# Patient Record
Sex: Female | Born: 1945 | Race: White | Hispanic: No | Marital: Married | Smoking: Never smoker
Health system: Southern US, Community
[De-identification: ages and names within clinical notes are randomized; demographics above are authoritative.]

## PROBLEM LIST (undated history)

## (undated) DIAGNOSIS — F419 Anxiety disorder, unspecified: Secondary | ICD-10-CM

## (undated) DIAGNOSIS — R0602 Shortness of breath: Secondary | ICD-10-CM

## (undated) DIAGNOSIS — D649 Anemia, unspecified: Secondary | ICD-10-CM

## (undated) DIAGNOSIS — K589 Irritable bowel syndrome without diarrhea: Secondary | ICD-10-CM

## (undated) DIAGNOSIS — R51 Headache: Secondary | ICD-10-CM

## (undated) DIAGNOSIS — F32A Depression, unspecified: Secondary | ICD-10-CM

## (undated) DIAGNOSIS — R413 Other amnesia: Secondary | ICD-10-CM

## (undated) DIAGNOSIS — F329 Major depressive disorder, single episode, unspecified: Secondary | ICD-10-CM

## (undated) DIAGNOSIS — K219 Gastro-esophageal reflux disease without esophagitis: Secondary | ICD-10-CM

## (undated) DIAGNOSIS — E039 Hypothyroidism, unspecified: Secondary | ICD-10-CM

## (undated) DIAGNOSIS — Z01818 Encounter for other preprocedural examination: Secondary | ICD-10-CM

## (undated) DIAGNOSIS — C801 Malignant (primary) neoplasm, unspecified: Secondary | ICD-10-CM

## (undated) DIAGNOSIS — F99 Mental disorder, not otherwise specified: Secondary | ICD-10-CM

## (undated) DIAGNOSIS — M1990 Unspecified osteoarthritis, unspecified site: Secondary | ICD-10-CM

## (undated) DIAGNOSIS — E871 Hypo-osmolality and hyponatremia: Secondary | ICD-10-CM

## (undated) DIAGNOSIS — G43909 Migraine, unspecified, not intractable, without status migrainosus: Secondary | ICD-10-CM

## (undated) DIAGNOSIS — S42309A Unspecified fracture of shaft of humerus, unspecified arm, initial encounter for closed fracture: Secondary | ICD-10-CM

## (undated) DIAGNOSIS — R569 Unspecified convulsions: Secondary | ICD-10-CM

## (undated) HISTORY — PX: STOMACH SURGERY: SHX791

## (undated) HISTORY — PX: APPENDECTOMY: SHX54

## (undated) HISTORY — PX: REPLACEMENT TOTAL KNEE: SUR1224

## (undated) HISTORY — DX: Migraine, unspecified, not intractable, without status migrainosus: G43.909

## (undated) HISTORY — DX: Hypo-osmolality and hyponatremia: E87.1

## (undated) HISTORY — PX: CHOLECYSTECTOMY: SHX55

## (undated) HISTORY — DX: Other amnesia: R41.3

## (undated) HISTORY — PX: TONSILLECTOMY: SUR1361

## (undated) HISTORY — PX: ABDOMINAL HYSTERECTOMY: SHX81

## (undated) HISTORY — PX: TUBAL LIGATION: SHX77

## (undated) HISTORY — PX: HERNIA REPAIR: SHX51

## (undated) HISTORY — DX: Encounter for other preprocedural examination: Z01.818

---

## 1970-09-04 HISTORY — PX: BREAST SURGERY: SHX581

## 1989-05-05 HISTORY — PX: BACK SURGERY: SHX140

## 1992-09-04 HISTORY — PX: GASTRECTOMY: SHX58

## 1998-02-02 ENCOUNTER — Emergency Department (HOSPITAL_COMMUNITY): Admission: EM | Admit: 1998-02-02 | Discharge: 1998-02-03 | Payer: Self-pay | Admitting: Emergency Medicine

## 1998-03-06 ENCOUNTER — Emergency Department (HOSPITAL_COMMUNITY): Admission: EM | Admit: 1998-03-06 | Discharge: 1998-03-06 | Payer: Self-pay | Admitting: Emergency Medicine

## 1998-04-05 ENCOUNTER — Emergency Department (HOSPITAL_COMMUNITY): Admission: EM | Admit: 1998-04-05 | Discharge: 1998-04-05 | Payer: Self-pay | Admitting: Internal Medicine

## 1998-04-06 ENCOUNTER — Emergency Department (HOSPITAL_COMMUNITY): Admission: EM | Admit: 1998-04-06 | Discharge: 1998-04-06 | Payer: Self-pay | Admitting: Emergency Medicine

## 1998-04-15 ENCOUNTER — Emergency Department (HOSPITAL_COMMUNITY): Admission: EM | Admit: 1998-04-15 | Discharge: 1998-04-15 | Payer: Self-pay

## 1998-04-16 ENCOUNTER — Emergency Department (HOSPITAL_COMMUNITY): Admission: EM | Admit: 1998-04-16 | Discharge: 1998-04-16 | Payer: Self-pay | Admitting: Emergency Medicine

## 1998-04-28 ENCOUNTER — Emergency Department (HOSPITAL_COMMUNITY): Admission: EM | Admit: 1998-04-28 | Discharge: 1998-04-28 | Payer: Self-pay | Admitting: Emergency Medicine

## 1998-05-08 ENCOUNTER — Emergency Department (HOSPITAL_COMMUNITY): Admission: EM | Admit: 1998-05-08 | Discharge: 1998-05-08 | Payer: Self-pay | Admitting: Emergency Medicine

## 1998-05-17 ENCOUNTER — Emergency Department (HOSPITAL_COMMUNITY): Admission: EM | Admit: 1998-05-17 | Discharge: 1998-05-17 | Payer: Self-pay | Admitting: Internal Medicine

## 1998-06-18 ENCOUNTER — Emergency Department (HOSPITAL_COMMUNITY): Admission: EM | Admit: 1998-06-18 | Discharge: 1998-06-18 | Payer: Self-pay | Admitting: Emergency Medicine

## 1998-07-07 ENCOUNTER — Emergency Department (HOSPITAL_COMMUNITY): Admission: EM | Admit: 1998-07-07 | Discharge: 1998-07-07 | Payer: Self-pay | Admitting: Emergency Medicine

## 1998-07-07 ENCOUNTER — Encounter: Payer: Self-pay | Admitting: Emergency Medicine

## 1998-09-18 ENCOUNTER — Emergency Department (HOSPITAL_COMMUNITY): Admission: EM | Admit: 1998-09-18 | Discharge: 1998-09-18 | Payer: Self-pay | Admitting: Endocrinology

## 1998-10-27 ENCOUNTER — Emergency Department (HOSPITAL_COMMUNITY): Admission: EM | Admit: 1998-10-27 | Discharge: 1998-10-27 | Payer: Self-pay | Admitting: Emergency Medicine

## 1999-01-16 ENCOUNTER — Emergency Department (HOSPITAL_COMMUNITY): Admission: EM | Admit: 1999-01-16 | Discharge: 1999-01-17 | Payer: Self-pay | Admitting: Emergency Medicine

## 1999-01-17 ENCOUNTER — Emergency Department (HOSPITAL_COMMUNITY): Admission: EM | Admit: 1999-01-17 | Discharge: 1999-01-17 | Payer: Self-pay | Admitting: Emergency Medicine

## 1999-02-03 ENCOUNTER — Emergency Department (HOSPITAL_COMMUNITY): Admission: EM | Admit: 1999-02-03 | Discharge: 1999-02-03 | Payer: Self-pay | Admitting: Emergency Medicine

## 1999-02-08 ENCOUNTER — Other Ambulatory Visit: Admission: RE | Admit: 1999-02-08 | Discharge: 1999-02-08 | Payer: Self-pay | Admitting: *Deleted

## 1999-02-21 ENCOUNTER — Emergency Department (HOSPITAL_COMMUNITY): Admission: EM | Admit: 1999-02-21 | Discharge: 1999-02-22 | Payer: Self-pay | Admitting: Emergency Medicine

## 1999-02-23 ENCOUNTER — Emergency Department (HOSPITAL_COMMUNITY): Admission: EM | Admit: 1999-02-23 | Discharge: 1999-02-23 | Payer: Self-pay | Admitting: Emergency Medicine

## 1999-02-27 ENCOUNTER — Emergency Department (HOSPITAL_COMMUNITY): Admission: EM | Admit: 1999-02-27 | Discharge: 1999-02-27 | Payer: Self-pay

## 1999-03-09 ENCOUNTER — Emergency Department (HOSPITAL_COMMUNITY): Admission: EM | Admit: 1999-03-09 | Discharge: 1999-03-09 | Payer: Self-pay | Admitting: Emergency Medicine

## 1999-03-24 ENCOUNTER — Emergency Department (HOSPITAL_COMMUNITY): Admission: EM | Admit: 1999-03-24 | Discharge: 1999-03-24 | Payer: Self-pay | Admitting: Emergency Medicine

## 1999-03-29 ENCOUNTER — Ambulatory Visit (HOSPITAL_COMMUNITY): Admission: RE | Admit: 1999-03-29 | Discharge: 1999-03-29 | Payer: Self-pay | Admitting: Gastroenterology

## 1999-04-03 ENCOUNTER — Emergency Department (HOSPITAL_COMMUNITY): Admission: EM | Admit: 1999-04-03 | Discharge: 1999-04-03 | Payer: Self-pay | Admitting: Emergency Medicine

## 1999-04-04 ENCOUNTER — Emergency Department (HOSPITAL_COMMUNITY): Admission: EM | Admit: 1999-04-04 | Discharge: 1999-04-04 | Payer: Self-pay | Admitting: Emergency Medicine

## 1999-04-26 ENCOUNTER — Emergency Department (HOSPITAL_COMMUNITY): Admission: EM | Admit: 1999-04-26 | Discharge: 1999-04-26 | Payer: Self-pay | Admitting: Emergency Medicine

## 1999-04-28 ENCOUNTER — Emergency Department (HOSPITAL_COMMUNITY): Admission: EM | Admit: 1999-04-28 | Discharge: 1999-04-28 | Payer: Self-pay | Admitting: Emergency Medicine

## 1999-05-01 ENCOUNTER — Emergency Department (HOSPITAL_COMMUNITY): Admission: EM | Admit: 1999-05-01 | Discharge: 1999-05-01 | Payer: Self-pay | Admitting: Emergency Medicine

## 1999-05-20 ENCOUNTER — Emergency Department (HOSPITAL_COMMUNITY): Admission: EM | Admit: 1999-05-20 | Discharge: 1999-05-20 | Payer: Self-pay

## 1999-05-24 ENCOUNTER — Emergency Department (HOSPITAL_COMMUNITY): Admission: EM | Admit: 1999-05-24 | Discharge: 1999-05-24 | Payer: Self-pay | Admitting: *Deleted

## 1999-05-25 ENCOUNTER — Emergency Department (HOSPITAL_COMMUNITY): Admission: EM | Admit: 1999-05-25 | Discharge: 1999-05-25 | Payer: Self-pay

## 1999-06-06 ENCOUNTER — Emergency Department (HOSPITAL_COMMUNITY): Admission: EM | Admit: 1999-06-06 | Discharge: 1999-06-06 | Payer: Self-pay | Admitting: Emergency Medicine

## 1999-06-08 ENCOUNTER — Emergency Department (HOSPITAL_COMMUNITY): Admission: EM | Admit: 1999-06-08 | Discharge: 1999-06-08 | Payer: Self-pay | Admitting: Emergency Medicine

## 1999-06-08 ENCOUNTER — Encounter: Payer: Self-pay | Admitting: Emergency Medicine

## 1999-06-18 ENCOUNTER — Emergency Department (HOSPITAL_COMMUNITY): Admission: EM | Admit: 1999-06-18 | Discharge: 1999-06-18 | Payer: Self-pay | Admitting: Emergency Medicine

## 1999-06-26 ENCOUNTER — Emergency Department (HOSPITAL_COMMUNITY): Admission: EM | Admit: 1999-06-26 | Discharge: 1999-06-26 | Payer: Self-pay | Admitting: Emergency Medicine

## 1999-07-07 ENCOUNTER — Emergency Department (HOSPITAL_COMMUNITY): Admission: EM | Admit: 1999-07-07 | Discharge: 1999-07-07 | Payer: Self-pay | Admitting: Emergency Medicine

## 1999-08-05 ENCOUNTER — Emergency Department (HOSPITAL_COMMUNITY): Admission: EM | Admit: 1999-08-05 | Discharge: 1999-08-05 | Payer: Self-pay | Admitting: *Deleted

## 1999-09-09 ENCOUNTER — Emergency Department (HOSPITAL_COMMUNITY): Admission: EM | Admit: 1999-09-09 | Discharge: 1999-09-09 | Payer: Self-pay | Admitting: Emergency Medicine

## 1999-09-25 ENCOUNTER — Emergency Department (HOSPITAL_COMMUNITY): Admission: EM | Admit: 1999-09-25 | Discharge: 1999-09-25 | Payer: Self-pay | Admitting: Emergency Medicine

## 1999-11-17 ENCOUNTER — Encounter: Admission: RE | Admit: 1999-11-17 | Discharge: 1999-11-17 | Payer: Self-pay

## 1999-11-29 ENCOUNTER — Emergency Department (HOSPITAL_COMMUNITY): Admission: EM | Admit: 1999-11-29 | Discharge: 1999-11-29 | Payer: Self-pay | Admitting: *Deleted

## 2000-01-08 ENCOUNTER — Emergency Department (HOSPITAL_COMMUNITY): Admission: EM | Admit: 2000-01-08 | Discharge: 2000-01-08 | Payer: Self-pay | Admitting: Emergency Medicine

## 2000-02-05 ENCOUNTER — Emergency Department (HOSPITAL_COMMUNITY): Admission: EM | Admit: 2000-02-05 | Discharge: 2000-02-05 | Payer: Self-pay | Admitting: Emergency Medicine

## 2000-03-25 ENCOUNTER — Emergency Department (HOSPITAL_COMMUNITY): Admission: EM | Admit: 2000-03-25 | Discharge: 2000-03-25 | Payer: Self-pay | Admitting: *Deleted

## 2000-04-17 ENCOUNTER — Encounter: Admission: RE | Admit: 2000-04-17 | Discharge: 2000-04-17 | Payer: Self-pay | Admitting: Gastroenterology

## 2000-04-17 ENCOUNTER — Encounter: Payer: Self-pay | Admitting: Gastroenterology

## 2000-04-18 ENCOUNTER — Encounter (INDEPENDENT_AMBULATORY_CARE_PROVIDER_SITE_OTHER): Payer: Self-pay | Admitting: Specialist

## 2000-04-18 ENCOUNTER — Ambulatory Visit (HOSPITAL_COMMUNITY): Admission: RE | Admit: 2000-04-18 | Discharge: 2000-04-18 | Payer: Self-pay | Admitting: Gastroenterology

## 2000-05-03 ENCOUNTER — Ambulatory Visit (HOSPITAL_COMMUNITY): Admission: RE | Admit: 2000-05-03 | Discharge: 2000-05-03 | Payer: Self-pay | Admitting: Gastroenterology

## 2000-05-03 ENCOUNTER — Encounter: Payer: Self-pay | Admitting: Gastroenterology

## 2000-05-07 ENCOUNTER — Emergency Department (HOSPITAL_COMMUNITY): Admission: EM | Admit: 2000-05-07 | Discharge: 2000-05-07 | Payer: Self-pay | Admitting: *Deleted

## 2000-06-12 ENCOUNTER — Inpatient Hospital Stay (HOSPITAL_COMMUNITY): Admission: RE | Admit: 2000-06-12 | Discharge: 2000-06-19 | Payer: Self-pay | Admitting: General Surgery

## 2000-06-12 ENCOUNTER — Encounter (INDEPENDENT_AMBULATORY_CARE_PROVIDER_SITE_OTHER): Payer: Self-pay | Admitting: Specialist

## 2000-07-18 ENCOUNTER — Emergency Department (HOSPITAL_COMMUNITY): Admission: EM | Admit: 2000-07-18 | Discharge: 2000-07-19 | Payer: Self-pay | Admitting: Emergency Medicine

## 2000-07-19 ENCOUNTER — Encounter: Payer: Self-pay | Admitting: Emergency Medicine

## 2000-08-15 ENCOUNTER — Emergency Department (HOSPITAL_COMMUNITY): Admission: EM | Admit: 2000-08-15 | Discharge: 2000-08-15 | Payer: Self-pay | Admitting: Emergency Medicine

## 2000-08-26 ENCOUNTER — Emergency Department (HOSPITAL_COMMUNITY): Admission: EM | Admit: 2000-08-26 | Discharge: 2000-08-26 | Payer: Self-pay | Admitting: Emergency Medicine

## 2000-09-10 ENCOUNTER — Emergency Department (HOSPITAL_COMMUNITY): Admission: EM | Admit: 2000-09-10 | Discharge: 2000-09-10 | Payer: Self-pay | Admitting: Emergency Medicine

## 2000-10-21 ENCOUNTER — Emergency Department (HOSPITAL_COMMUNITY): Admission: EM | Admit: 2000-10-21 | Discharge: 2000-10-21 | Payer: Self-pay | Admitting: Emergency Medicine

## 2000-12-30 ENCOUNTER — Emergency Department (HOSPITAL_COMMUNITY): Admission: EM | Admit: 2000-12-30 | Discharge: 2000-12-30 | Payer: Self-pay | Admitting: Emergency Medicine

## 2001-01-19 ENCOUNTER — Emergency Department (HOSPITAL_COMMUNITY): Admission: EM | Admit: 2001-01-19 | Discharge: 2001-01-19 | Payer: Self-pay | Admitting: Emergency Medicine

## 2001-02-15 ENCOUNTER — Emergency Department (HOSPITAL_COMMUNITY): Admission: EM | Admit: 2001-02-15 | Discharge: 2001-02-15 | Payer: Self-pay | Admitting: Emergency Medicine

## 2001-02-26 ENCOUNTER — Emergency Department (HOSPITAL_COMMUNITY): Admission: EM | Admit: 2001-02-26 | Discharge: 2001-02-26 | Payer: Self-pay | Admitting: Emergency Medicine

## 2001-03-01 ENCOUNTER — Emergency Department (HOSPITAL_COMMUNITY): Admission: EM | Admit: 2001-03-01 | Discharge: 2001-03-01 | Payer: Self-pay | Admitting: Emergency Medicine

## 2001-03-14 ENCOUNTER — Emergency Department (HOSPITAL_COMMUNITY): Admission: EM | Admit: 2001-03-14 | Discharge: 2001-03-14 | Payer: Self-pay | Admitting: Emergency Medicine

## 2001-03-19 ENCOUNTER — Encounter: Payer: Self-pay | Admitting: Emergency Medicine

## 2001-03-19 ENCOUNTER — Emergency Department (HOSPITAL_COMMUNITY): Admission: EM | Admit: 2001-03-19 | Discharge: 2001-03-19 | Payer: Self-pay | Admitting: Emergency Medicine

## 2001-05-25 ENCOUNTER — Emergency Department (HOSPITAL_COMMUNITY): Admission: EM | Admit: 2001-05-25 | Discharge: 2001-05-25 | Payer: Self-pay | Admitting: Emergency Medicine

## 2001-06-24 ENCOUNTER — Emergency Department (HOSPITAL_COMMUNITY): Admission: EM | Admit: 2001-06-24 | Discharge: 2001-06-25 | Payer: Self-pay | Admitting: Emergency Medicine

## 2001-07-10 ENCOUNTER — Encounter: Payer: Self-pay | Admitting: *Deleted

## 2001-07-10 ENCOUNTER — Inpatient Hospital Stay (HOSPITAL_COMMUNITY): Admission: EM | Admit: 2001-07-10 | Discharge: 2001-07-20 | Payer: Self-pay | Admitting: *Deleted

## 2001-07-11 ENCOUNTER — Encounter: Payer: Self-pay | Admitting: Family Medicine

## 2001-07-16 ENCOUNTER — Encounter: Payer: Self-pay | Admitting: Family Medicine

## 2001-07-17 ENCOUNTER — Encounter: Payer: Self-pay | Admitting: Family Medicine

## 2001-07-25 ENCOUNTER — Encounter: Admission: RE | Admit: 2001-07-25 | Discharge: 2001-07-25 | Payer: Self-pay | Admitting: Family Medicine

## 2001-12-18 ENCOUNTER — Emergency Department (HOSPITAL_COMMUNITY): Admission: EM | Admit: 2001-12-18 | Discharge: 2001-12-18 | Payer: Self-pay | Admitting: Emergency Medicine

## 2002-02-17 ENCOUNTER — Emergency Department (HOSPITAL_COMMUNITY): Admission: EM | Admit: 2002-02-17 | Discharge: 2002-02-17 | Payer: Self-pay | Admitting: Emergency Medicine

## 2002-03-05 ENCOUNTER — Emergency Department (HOSPITAL_COMMUNITY): Admission: EM | Admit: 2002-03-05 | Discharge: 2002-03-05 | Payer: Self-pay

## 2002-03-08 ENCOUNTER — Emergency Department (HOSPITAL_COMMUNITY): Admission: EM | Admit: 2002-03-08 | Discharge: 2002-03-08 | Payer: Self-pay | Admitting: Emergency Medicine

## 2002-03-18 ENCOUNTER — Emergency Department (HOSPITAL_COMMUNITY): Admission: EM | Admit: 2002-03-18 | Discharge: 2002-03-18 | Payer: Self-pay | Admitting: Emergency Medicine

## 2002-04-21 ENCOUNTER — Emergency Department (HOSPITAL_COMMUNITY): Admission: EM | Admit: 2002-04-21 | Discharge: 2002-04-21 | Payer: Self-pay | Admitting: Emergency Medicine

## 2002-05-09 ENCOUNTER — Emergency Department (HOSPITAL_COMMUNITY): Admission: EM | Admit: 2002-05-09 | Discharge: 2002-05-09 | Payer: Self-pay | Admitting: *Deleted

## 2002-05-10 ENCOUNTER — Emergency Department (HOSPITAL_COMMUNITY): Admission: EM | Admit: 2002-05-10 | Discharge: 2002-05-10 | Payer: Self-pay | Admitting: *Deleted

## 2002-06-02 ENCOUNTER — Emergency Department (HOSPITAL_COMMUNITY): Admission: EM | Admit: 2002-06-02 | Discharge: 2002-06-02 | Payer: Self-pay | Admitting: Emergency Medicine

## 2002-06-21 ENCOUNTER — Encounter: Payer: Self-pay | Admitting: *Deleted

## 2002-06-21 ENCOUNTER — Inpatient Hospital Stay (HOSPITAL_COMMUNITY): Admission: EM | Admit: 2002-06-21 | Discharge: 2002-06-23 | Payer: Self-pay | Admitting: Emergency Medicine

## 2002-06-23 ENCOUNTER — Encounter: Payer: Self-pay | Admitting: Cardiology

## 2002-08-06 ENCOUNTER — Emergency Department (HOSPITAL_COMMUNITY): Admission: EM | Admit: 2002-08-06 | Discharge: 2002-08-06 | Payer: Self-pay

## 2002-09-04 HISTORY — PX: ORIF ANKLE FRACTURE: SUR919

## 2002-09-12 ENCOUNTER — Emergency Department (HOSPITAL_COMMUNITY): Admission: EM | Admit: 2002-09-12 | Discharge: 2002-09-12 | Payer: Self-pay | Admitting: Emergency Medicine

## 2002-10-01 ENCOUNTER — Emergency Department (HOSPITAL_COMMUNITY): Admission: EM | Admit: 2002-10-01 | Discharge: 2002-10-01 | Payer: Self-pay | Admitting: Emergency Medicine

## 2002-10-11 ENCOUNTER — Emergency Department (HOSPITAL_COMMUNITY): Admission: EM | Admit: 2002-10-11 | Discharge: 2002-10-11 | Payer: Self-pay | Admitting: Emergency Medicine

## 2002-10-18 ENCOUNTER — Emergency Department (HOSPITAL_COMMUNITY): Admission: EM | Admit: 2002-10-18 | Discharge: 2002-10-18 | Payer: Self-pay | Admitting: Emergency Medicine

## 2002-11-18 ENCOUNTER — Emergency Department (HOSPITAL_COMMUNITY): Admission: EM | Admit: 2002-11-18 | Discharge: 2002-11-18 | Payer: Self-pay | Admitting: Emergency Medicine

## 2002-12-17 ENCOUNTER — Emergency Department (HOSPITAL_COMMUNITY): Admission: EM | Admit: 2002-12-17 | Discharge: 2002-12-17 | Payer: Self-pay | Admitting: Emergency Medicine

## 2002-12-24 ENCOUNTER — Emergency Department (HOSPITAL_COMMUNITY): Admission: EM | Admit: 2002-12-24 | Discharge: 2002-12-24 | Payer: Self-pay | Admitting: Emergency Medicine

## 2003-01-01 ENCOUNTER — Emergency Department (HOSPITAL_COMMUNITY): Admission: EM | Admit: 2003-01-01 | Discharge: 2003-01-01 | Payer: Self-pay | Admitting: Emergency Medicine

## 2003-01-14 ENCOUNTER — Encounter: Payer: Self-pay | Admitting: Emergency Medicine

## 2003-01-14 ENCOUNTER — Inpatient Hospital Stay (HOSPITAL_COMMUNITY): Admission: EM | Admit: 2003-01-14 | Discharge: 2003-01-28 | Payer: Self-pay | Admitting: Emergency Medicine

## 2003-01-16 ENCOUNTER — Encounter: Payer: Self-pay | Admitting: General Surgery

## 2003-01-19 ENCOUNTER — Encounter (HOSPITAL_BASED_OUTPATIENT_CLINIC_OR_DEPARTMENT_OTHER): Payer: Self-pay | Admitting: General Surgery

## 2003-01-19 ENCOUNTER — Encounter: Payer: Self-pay | Admitting: General Surgery

## 2003-01-20 ENCOUNTER — Encounter: Payer: Self-pay | Admitting: General Surgery

## 2003-02-15 ENCOUNTER — Emergency Department (HOSPITAL_COMMUNITY): Admission: EM | Admit: 2003-02-15 | Discharge: 2003-02-15 | Payer: Self-pay | Admitting: Emergency Medicine

## 2003-03-02 ENCOUNTER — Emergency Department (HOSPITAL_COMMUNITY): Admission: EM | Admit: 2003-03-02 | Discharge: 2003-03-02 | Payer: Self-pay | Admitting: Emergency Medicine

## 2003-03-03 ENCOUNTER — Emergency Department (HOSPITAL_COMMUNITY): Admission: EM | Admit: 2003-03-03 | Discharge: 2003-03-03 | Payer: Self-pay | Admitting: Emergency Medicine

## 2003-03-16 ENCOUNTER — Emergency Department (HOSPITAL_COMMUNITY): Admission: EM | Admit: 2003-03-16 | Discharge: 2003-03-16 | Payer: Self-pay | Admitting: Emergency Medicine

## 2003-04-03 ENCOUNTER — Encounter: Admission: RE | Admit: 2003-04-03 | Discharge: 2003-04-03 | Payer: Self-pay | Admitting: Gastroenterology

## 2003-04-03 ENCOUNTER — Encounter: Payer: Self-pay | Admitting: Gastroenterology

## 2003-04-13 ENCOUNTER — Encounter: Payer: Self-pay | Admitting: Family Medicine

## 2003-04-13 ENCOUNTER — Encounter: Admission: RE | Admit: 2003-04-13 | Discharge: 2003-04-13 | Payer: Self-pay | Admitting: Family Medicine

## 2003-04-27 ENCOUNTER — Encounter: Admission: RE | Admit: 2003-04-27 | Discharge: 2003-04-27 | Payer: Self-pay | Admitting: Family Medicine

## 2003-05-02 ENCOUNTER — Emergency Department (HOSPITAL_COMMUNITY): Admission: EM | Admit: 2003-05-02 | Discharge: 2003-05-02 | Payer: Self-pay | Admitting: Emergency Medicine

## 2003-05-22 ENCOUNTER — Emergency Department (HOSPITAL_COMMUNITY): Admission: EM | Admit: 2003-05-22 | Discharge: 2003-05-22 | Payer: Self-pay | Admitting: Emergency Medicine

## 2003-05-24 ENCOUNTER — Emergency Department (HOSPITAL_COMMUNITY): Admission: EM | Admit: 2003-05-24 | Discharge: 2003-05-24 | Payer: Self-pay | Admitting: Emergency Medicine

## 2003-05-26 ENCOUNTER — Emergency Department (HOSPITAL_COMMUNITY): Admission: EM | Admit: 2003-05-26 | Discharge: 2003-05-26 | Payer: Self-pay | Admitting: Emergency Medicine

## 2003-06-03 ENCOUNTER — Emergency Department (HOSPITAL_COMMUNITY): Admission: EM | Admit: 2003-06-03 | Discharge: 2003-06-03 | Payer: Self-pay | Admitting: Emergency Medicine

## 2003-06-22 ENCOUNTER — Emergency Department (HOSPITAL_COMMUNITY): Admission: EM | Admit: 2003-06-22 | Discharge: 2003-06-23 | Payer: Self-pay | Admitting: Emergency Medicine

## 2003-07-21 ENCOUNTER — Emergency Department (HOSPITAL_COMMUNITY): Admission: EM | Admit: 2003-07-21 | Discharge: 2003-07-21 | Payer: Self-pay | Admitting: Emergency Medicine

## 2003-09-05 HISTORY — PX: SPINE SURGERY: SHX786

## 2003-09-17 ENCOUNTER — Emergency Department (HOSPITAL_COMMUNITY): Admission: EM | Admit: 2003-09-17 | Discharge: 2003-09-17 | Payer: Self-pay | Admitting: Emergency Medicine

## 2003-09-18 ENCOUNTER — Emergency Department (HOSPITAL_COMMUNITY): Admission: EM | Admit: 2003-09-18 | Discharge: 2003-09-18 | Payer: Self-pay | Admitting: Emergency Medicine

## 2003-09-23 ENCOUNTER — Inpatient Hospital Stay (HOSPITAL_COMMUNITY): Admission: RE | Admit: 2003-09-23 | Discharge: 2003-09-26 | Payer: Self-pay | Admitting: General Surgery

## 2003-10-17 ENCOUNTER — Observation Stay (HOSPITAL_COMMUNITY): Admission: AD | Admit: 2003-10-17 | Discharge: 2003-10-17 | Payer: Self-pay | Admitting: *Deleted

## 2003-11-18 ENCOUNTER — Emergency Department (HOSPITAL_COMMUNITY): Admission: EM | Admit: 2003-11-18 | Discharge: 2003-11-18 | Payer: Self-pay | Admitting: Emergency Medicine

## 2003-12-02 ENCOUNTER — Emergency Department (HOSPITAL_COMMUNITY): Admission: EM | Admit: 2003-12-02 | Discharge: 2003-12-02 | Payer: Self-pay | Admitting: Emergency Medicine

## 2004-01-04 ENCOUNTER — Encounter: Admission: RE | Admit: 2004-01-04 | Discharge: 2004-01-04 | Payer: Self-pay | Admitting: Family Medicine

## 2004-05-18 ENCOUNTER — Ambulatory Visit: Payer: Self-pay | Admitting: Family Medicine

## 2004-05-18 ENCOUNTER — Inpatient Hospital Stay (HOSPITAL_COMMUNITY): Admission: AD | Admit: 2004-05-18 | Discharge: 2004-05-19 | Payer: Self-pay | Admitting: Family Medicine

## 2004-05-31 ENCOUNTER — Ambulatory Visit: Payer: Self-pay | Admitting: Family Medicine

## 2004-07-24 ENCOUNTER — Emergency Department (HOSPITAL_COMMUNITY): Admission: EM | Admit: 2004-07-24 | Discharge: 2004-07-24 | Payer: Self-pay | Admitting: *Deleted

## 2004-07-25 ENCOUNTER — Emergency Department (HOSPITAL_COMMUNITY): Admission: EM | Admit: 2004-07-25 | Discharge: 2004-07-25 | Payer: Self-pay | Admitting: Emergency Medicine

## 2004-08-08 ENCOUNTER — Emergency Department (HOSPITAL_COMMUNITY): Admission: EM | Admit: 2004-08-08 | Discharge: 2004-08-08 | Payer: Self-pay | Admitting: Emergency Medicine

## 2004-08-27 ENCOUNTER — Emergency Department (HOSPITAL_COMMUNITY): Admission: EM | Admit: 2004-08-27 | Discharge: 2004-08-27 | Payer: Self-pay | Admitting: Emergency Medicine

## 2004-09-02 ENCOUNTER — Emergency Department (HOSPITAL_COMMUNITY): Admission: EM | Admit: 2004-09-02 | Discharge: 2004-09-02 | Payer: Self-pay | Admitting: *Deleted

## 2004-09-11 ENCOUNTER — Inpatient Hospital Stay (HOSPITAL_COMMUNITY): Admission: EM | Admit: 2004-09-11 | Discharge: 2004-09-28 | Payer: Self-pay | Admitting: Emergency Medicine

## 2004-09-11 ENCOUNTER — Ambulatory Visit: Payer: Self-pay | Admitting: Pulmonary Disease

## 2004-09-11 ENCOUNTER — Ambulatory Visit: Payer: Self-pay | Admitting: Sports Medicine

## 2004-11-02 ENCOUNTER — Ambulatory Visit: Payer: Self-pay | Admitting: Family Medicine

## 2005-01-05 ENCOUNTER — Encounter
Admission: RE | Admit: 2005-01-05 | Discharge: 2005-01-05 | Payer: Self-pay | Admitting: Physical Medicine and Rehabilitation

## 2005-01-11 ENCOUNTER — Encounter (INDEPENDENT_AMBULATORY_CARE_PROVIDER_SITE_OTHER): Payer: Self-pay | Admitting: *Deleted

## 2005-01-11 ENCOUNTER — Encounter
Admission: RE | Admit: 2005-01-11 | Discharge: 2005-01-11 | Payer: Self-pay | Admitting: Physical Medicine and Rehabilitation

## 2005-01-17 ENCOUNTER — Encounter: Admission: RE | Admit: 2005-01-17 | Discharge: 2005-01-17 | Payer: Self-pay | Admitting: Family Medicine

## 2005-04-18 ENCOUNTER — Encounter: Admission: RE | Admit: 2005-04-18 | Discharge: 2005-04-18 | Payer: Self-pay | Admitting: Specialist

## 2005-05-01 ENCOUNTER — Inpatient Hospital Stay (HOSPITAL_COMMUNITY): Admission: RE | Admit: 2005-05-01 | Discharge: 2005-05-03 | Payer: Self-pay | Admitting: Specialist

## 2005-07-18 ENCOUNTER — Ambulatory Visit: Payer: Self-pay | Admitting: Family Medicine

## 2005-08-01 ENCOUNTER — Ambulatory Visit: Payer: Self-pay | Admitting: Family Medicine

## 2005-10-18 ENCOUNTER — Encounter: Admission: RE | Admit: 2005-10-18 | Discharge: 2005-10-18 | Payer: Self-pay | Admitting: Sports Medicine

## 2006-01-26 ENCOUNTER — Emergency Department (HOSPITAL_COMMUNITY): Admission: EM | Admit: 2006-01-26 | Discharge: 2006-01-26 | Payer: Self-pay | Admitting: Emergency Medicine

## 2006-01-30 ENCOUNTER — Ambulatory Visit: Payer: Self-pay | Admitting: Sports Medicine

## 2006-02-14 ENCOUNTER — Ambulatory Visit: Payer: Self-pay | Admitting: Family Medicine

## 2006-02-26 ENCOUNTER — Ambulatory Visit: Payer: Self-pay | Admitting: Sports Medicine

## 2006-05-30 ENCOUNTER — Ambulatory Visit: Payer: Self-pay | Admitting: Family Medicine

## 2006-05-30 ENCOUNTER — Ambulatory Visit (HOSPITAL_COMMUNITY): Admission: RE | Admit: 2006-05-30 | Discharge: 2006-05-30 | Payer: Self-pay | Admitting: Family Medicine

## 2006-06-06 ENCOUNTER — Ambulatory Visit: Payer: Self-pay | Admitting: Family Medicine

## 2006-06-11 ENCOUNTER — Ambulatory Visit (HOSPITAL_COMMUNITY): Admission: RE | Admit: 2006-06-11 | Discharge: 2006-06-12 | Payer: Self-pay | Admitting: Obstetrics and Gynecology

## 2006-06-30 ENCOUNTER — Inpatient Hospital Stay (HOSPITAL_COMMUNITY): Admission: EM | Admit: 2006-06-30 | Discharge: 2006-07-05 | Payer: Self-pay | Admitting: Emergency Medicine

## 2006-07-04 ENCOUNTER — Ambulatory Visit: Payer: Self-pay | Admitting: Physical Medicine & Rehabilitation

## 2006-11-01 DIAGNOSIS — K589 Irritable bowel syndrome without diarrhea: Secondary | ICD-10-CM

## 2006-11-01 DIAGNOSIS — F410 Panic disorder [episodic paroxysmal anxiety] without agoraphobia: Secondary | ICD-10-CM | POA: Insufficient documentation

## 2006-11-01 DIAGNOSIS — G43909 Migraine, unspecified, not intractable, without status migrainosus: Secondary | ICD-10-CM

## 2006-11-01 DIAGNOSIS — M26609 Unspecified temporomandibular joint disorder, unspecified side: Secondary | ICD-10-CM | POA: Insufficient documentation

## 2006-11-01 DIAGNOSIS — M949 Disorder of cartilage, unspecified: Secondary | ICD-10-CM

## 2006-11-01 DIAGNOSIS — G5600 Carpal tunnel syndrome, unspecified upper limb: Secondary | ICD-10-CM

## 2006-11-01 DIAGNOSIS — E039 Hypothyroidism, unspecified: Secondary | ICD-10-CM

## 2006-11-01 DIAGNOSIS — M899 Disorder of bone, unspecified: Secondary | ICD-10-CM | POA: Insufficient documentation

## 2006-11-01 DIAGNOSIS — F339 Major depressive disorder, recurrent, unspecified: Secondary | ICD-10-CM | POA: Insufficient documentation

## 2006-11-06 ENCOUNTER — Telehealth: Payer: Self-pay | Admitting: *Deleted

## 2006-11-14 ENCOUNTER — Telehealth: Payer: Self-pay | Admitting: *Deleted

## 2006-11-15 ENCOUNTER — Encounter (INDEPENDENT_AMBULATORY_CARE_PROVIDER_SITE_OTHER): Payer: Self-pay | Admitting: Family Medicine

## 2006-11-15 ENCOUNTER — Ambulatory Visit: Payer: Self-pay | Admitting: Family Medicine

## 2006-11-15 LAB — CONVERTED CEMR LAB
ALT: 11 units/L (ref 0–35)
AST: 18 units/L (ref 0–37)
Alkaline Phosphatase: 119 units/L — ABNORMAL HIGH (ref 39–117)
CO2: 20 meq/L (ref 19–32)
Creatinine, Ser: 0.98 mg/dL (ref 0.40–1.20)
HCT: 37.8 % (ref 36.0–46.0)
MCV: 84.6 fL (ref 78.0–100.0)
Platelets: 389 10*3/uL (ref 150–400)
RDW: 15.4 % — ABNORMAL HIGH (ref 11.5–14.0)
Sodium: 132 meq/L — ABNORMAL LOW (ref 135–145)
Total Bilirubin: 0.4 mg/dL (ref 0.3–1.2)
Total Protein: 6.7 g/dL (ref 6.0–8.3)
WBC: 14.5 10*3/uL — ABNORMAL HIGH (ref 4.0–10.5)

## 2006-11-16 ENCOUNTER — Encounter (INDEPENDENT_AMBULATORY_CARE_PROVIDER_SITE_OTHER): Payer: Self-pay | Admitting: *Deleted

## 2006-11-16 LAB — CONVERTED CEMR LAB
TSH: 1.919 microintl units/mL
aPTT: 29 s

## 2006-11-19 ENCOUNTER — Ambulatory Visit (HOSPITAL_COMMUNITY): Admission: RE | Admit: 2006-11-19 | Discharge: 2006-11-19 | Payer: Self-pay | Admitting: Gastroenterology

## 2006-11-19 ENCOUNTER — Encounter (INDEPENDENT_AMBULATORY_CARE_PROVIDER_SITE_OTHER): Payer: Self-pay | Admitting: Specialist

## 2006-11-19 LAB — HM COLONOSCOPY

## 2006-11-22 ENCOUNTER — Encounter (INDEPENDENT_AMBULATORY_CARE_PROVIDER_SITE_OTHER): Payer: Self-pay | Admitting: *Deleted

## 2006-12-04 ENCOUNTER — Encounter (INDEPENDENT_AMBULATORY_CARE_PROVIDER_SITE_OTHER): Payer: Self-pay | Admitting: *Deleted

## 2006-12-26 ENCOUNTER — Ambulatory Visit: Payer: Self-pay | Admitting: Sports Medicine

## 2006-12-26 DIAGNOSIS — F1120 Opioid dependence, uncomplicated: Secondary | ICD-10-CM | POA: Insufficient documentation

## 2006-12-26 DIAGNOSIS — N3942 Incontinence without sensory awareness: Secondary | ICD-10-CM

## 2006-12-26 DIAGNOSIS — R413 Other amnesia: Secondary | ICD-10-CM

## 2007-01-31 ENCOUNTER — Ambulatory Visit (HOSPITAL_COMMUNITY): Admission: RE | Admit: 2007-01-31 | Discharge: 2007-02-01 | Payer: Self-pay | Admitting: Orthopedic Surgery

## 2007-02-17 ENCOUNTER — Emergency Department (HOSPITAL_COMMUNITY): Admission: EM | Admit: 2007-02-17 | Discharge: 2007-02-17 | Payer: Self-pay | Admitting: Emergency Medicine

## 2007-04-10 ENCOUNTER — Telehealth: Payer: Self-pay | Admitting: Family Medicine

## 2007-04-25 ENCOUNTER — Telehealth: Payer: Self-pay | Admitting: *Deleted

## 2007-06-10 ENCOUNTER — Encounter: Payer: Self-pay | Admitting: Family Medicine

## 2007-06-10 ENCOUNTER — Ambulatory Visit: Payer: Self-pay | Admitting: Sports Medicine

## 2007-06-13 LAB — CONVERTED CEMR LAB
ALT: 13 units/L (ref 0–35)
BUN: 14 mg/dL (ref 6–23)
CO2: 24 meq/L (ref 19–32)
Calcium: 9.2 mg/dL (ref 8.4–10.5)
Chloride: 98 meq/L (ref 96–112)
Creatinine, Ser: 1.11 mg/dL (ref 0.40–1.20)
Glucose, Bld: 92 mg/dL (ref 70–99)
HCT: 35.2 % — ABNORMAL LOW (ref 36.0–46.0)
Hemoglobin: 10.9 g/dL — ABNORMAL LOW (ref 12.0–15.0)
MCV: 76.5 fL — ABNORMAL LOW (ref 78.0–100.0)
Platelets: 246 10*3/uL (ref 150–400)
Pro B Natriuretic peptide (BNP): 46 pg/mL (ref 0.0–100.0)
RBC: 4.6 M/uL (ref 3.87–5.11)
TSH: 2.856 microintl units/mL (ref 0.350–5.50)
Total Bilirubin: 0.4 mg/dL (ref 0.3–1.2)
WBC: 10.3 10*3/uL (ref 4.0–10.5)

## 2007-07-11 ENCOUNTER — Encounter: Admission: RE | Admit: 2007-07-11 | Discharge: 2007-07-11 | Payer: Self-pay | Admitting: Sports Medicine

## 2007-07-12 ENCOUNTER — Ambulatory Visit: Payer: Self-pay | Admitting: Family Medicine

## 2007-07-17 ENCOUNTER — Encounter: Payer: Self-pay | Admitting: Family Medicine

## 2007-07-18 ENCOUNTER — Ambulatory Visit: Payer: Self-pay | Admitting: Cardiology

## 2007-07-22 LAB — CONVERTED CEMR LAB
Creatinine 24 HR UR: 523 mg/24hr — ABNORMAL LOW (ref 700–1800)
Creatinine, Urine: 74.8 mg/dL

## 2007-07-23 ENCOUNTER — Encounter (INDEPENDENT_AMBULATORY_CARE_PROVIDER_SITE_OTHER): Payer: Self-pay | Admitting: Emergency Medicine

## 2007-07-23 ENCOUNTER — Ambulatory Visit: Payer: Self-pay | Admitting: Cardiology

## 2007-07-23 ENCOUNTER — Ambulatory Visit (HOSPITAL_COMMUNITY): Admission: RE | Admit: 2007-07-23 | Discharge: 2007-07-23 | Payer: Self-pay | Admitting: Emergency Medicine

## 2007-08-12 ENCOUNTER — Ambulatory Visit: Payer: Self-pay | Admitting: Cardiology

## 2007-08-13 ENCOUNTER — Ambulatory Visit: Payer: Self-pay | Admitting: Family Medicine

## 2007-09-14 ENCOUNTER — Encounter: Payer: Self-pay | Admitting: Family Medicine

## 2007-11-11 ENCOUNTER — Telehealth: Payer: Self-pay | Admitting: Family Medicine

## 2007-12-27 ENCOUNTER — Telehealth (INDEPENDENT_AMBULATORY_CARE_PROVIDER_SITE_OTHER): Payer: Self-pay | Admitting: *Deleted

## 2008-01-28 ENCOUNTER — Encounter: Payer: Self-pay | Admitting: *Deleted

## 2008-01-31 ENCOUNTER — Encounter: Admission: RE | Admit: 2008-01-31 | Discharge: 2008-01-31 | Payer: Self-pay | Admitting: Orthopedic Surgery

## 2008-02-25 ENCOUNTER — Encounter (INDEPENDENT_AMBULATORY_CARE_PROVIDER_SITE_OTHER): Payer: Self-pay | Admitting: *Deleted

## 2008-03-05 ENCOUNTER — Encounter: Payer: Self-pay | Admitting: Family Medicine

## 2008-03-09 ENCOUNTER — Ambulatory Visit: Payer: Self-pay | Admitting: Family Medicine

## 2008-04-08 ENCOUNTER — Encounter: Payer: Self-pay | Admitting: Family Medicine

## 2008-09-11 ENCOUNTER — Telehealth: Payer: Self-pay | Admitting: *Deleted

## 2008-10-28 ENCOUNTER — Telehealth: Payer: Self-pay | Admitting: Family Medicine

## 2009-01-13 ENCOUNTER — Encounter: Admission: RE | Admit: 2009-01-13 | Discharge: 2009-01-13 | Payer: Self-pay | Admitting: Family Medicine

## 2009-01-14 ENCOUNTER — Ambulatory Visit: Payer: Self-pay | Admitting: Family Medicine

## 2009-01-20 ENCOUNTER — Encounter: Payer: Self-pay | Admitting: Family Medicine

## 2009-01-24 ENCOUNTER — Ambulatory Visit: Payer: Self-pay | Admitting: Family Medicine

## 2009-01-24 ENCOUNTER — Inpatient Hospital Stay (HOSPITAL_COMMUNITY): Admission: EM | Admit: 2009-01-24 | Discharge: 2009-01-26 | Payer: Self-pay | Admitting: Emergency Medicine

## 2009-02-05 ENCOUNTER — Telehealth: Payer: Self-pay | Admitting: Family Medicine

## 2009-03-04 ENCOUNTER — Inpatient Hospital Stay (HOSPITAL_COMMUNITY): Admission: EM | Admit: 2009-03-04 | Discharge: 2009-03-06 | Payer: Self-pay | Admitting: Emergency Medicine

## 2009-03-04 ENCOUNTER — Ambulatory Visit: Payer: Self-pay | Admitting: Cardiology

## 2009-03-04 ENCOUNTER — Encounter: Payer: Self-pay | Admitting: Family Medicine

## 2009-03-04 ENCOUNTER — Ambulatory Visit: Payer: Self-pay | Admitting: Family Medicine

## 2009-03-05 ENCOUNTER — Ambulatory Visit: Payer: Self-pay | Admitting: Vascular Surgery

## 2009-03-05 ENCOUNTER — Encounter: Payer: Self-pay | Admitting: Family Medicine

## 2009-03-06 ENCOUNTER — Encounter: Payer: Self-pay | Admitting: Family Medicine

## 2009-03-06 DIAGNOSIS — D509 Iron deficiency anemia, unspecified: Secondary | ICD-10-CM | POA: Insufficient documentation

## 2009-03-09 ENCOUNTER — Ambulatory Visit: Payer: Self-pay | Admitting: Family Medicine

## 2009-03-09 LAB — CONVERTED CEMR LAB: Hemoglobin: 8.8 g/dL

## 2009-03-15 ENCOUNTER — Encounter: Payer: Self-pay | Admitting: Family Medicine

## 2009-04-15 ENCOUNTER — Encounter: Payer: Self-pay | Admitting: Family Medicine

## 2009-04-15 ENCOUNTER — Ambulatory Visit: Payer: Self-pay | Admitting: Family Medicine

## 2009-04-15 LAB — CONVERTED CEMR LAB
BUN: 15 mg/dL (ref 6–23)
CO2: 19 meq/L (ref 19–32)
Chloride: 100 meq/L (ref 96–112)
Creatinine, Ser: 1.04 mg/dL (ref 0.40–1.20)
HDL: 77 mg/dL (ref >39)
Hemoglobin: 10.3 g/dL
LDL Cholesterol: 65 mg/dL (ref 0–99)
Potassium: 4 meq/L (ref 3.5–5.3)
TSH: 4.944 microintl units/mL — ABNORMAL HIGH (ref 0.350–4.500)
Triglycerides: 164 mg/dL — ABNORMAL HIGH (ref <150)
VLDL: 33 mg/dL (ref 0–40)

## 2009-04-16 ENCOUNTER — Encounter: Payer: Self-pay | Admitting: Family Medicine

## 2009-04-19 ENCOUNTER — Encounter: Payer: Self-pay | Admitting: Family Medicine

## 2009-04-23 ENCOUNTER — Encounter: Payer: Self-pay | Admitting: Family Medicine

## 2009-06-03 ENCOUNTER — Ambulatory Visit: Payer: Self-pay | Admitting: Family Medicine

## 2009-06-03 ENCOUNTER — Encounter: Payer: Self-pay | Admitting: Family Medicine

## 2009-06-03 LAB — CONVERTED CEMR LAB
Chloride: 101 meq/L (ref 96–112)
MCHC: 29.5 g/dL — ABNORMAL LOW (ref 30.0–36.0)
Potassium: 4.1 meq/L (ref 3.5–5.3)
RDW: 20.5 % — ABNORMAL HIGH (ref 11.5–15.5)
Sodium: 137 meq/L (ref 135–145)
TSH: 2.787 microintl units/mL (ref 0.350–4.500)

## 2009-06-04 ENCOUNTER — Encounter: Payer: Self-pay | Admitting: Family Medicine

## 2009-07-03 ENCOUNTER — Ambulatory Visit: Payer: Self-pay | Admitting: Family Medicine

## 2009-07-03 ENCOUNTER — Inpatient Hospital Stay (HOSPITAL_COMMUNITY): Admission: EM | Admit: 2009-07-03 | Discharge: 2009-07-05 | Payer: Self-pay | Admitting: Family Medicine

## 2009-07-03 ENCOUNTER — Encounter: Payer: Self-pay | Admitting: Family Medicine

## 2009-07-03 ENCOUNTER — Encounter: Payer: Self-pay | Admitting: Emergency Medicine

## 2009-07-13 ENCOUNTER — Emergency Department (HOSPITAL_COMMUNITY): Admission: EM | Admit: 2009-07-13 | Discharge: 2009-07-14 | Payer: Self-pay | Admitting: Emergency Medicine

## 2009-07-22 ENCOUNTER — Ambulatory Visit: Payer: Self-pay | Admitting: Family Medicine

## 2009-07-22 ENCOUNTER — Encounter: Payer: Self-pay | Admitting: Family Medicine

## 2009-07-22 LAB — CONVERTED CEMR LAB
CO2: 19 meq/L (ref 19–32)
Chloride: 105 meq/L (ref 96–112)
Hemoglobin: 12.8 g/dL
Potassium: 4.3 meq/L (ref 3.5–5.3)
Sodium: 137 meq/L (ref 135–145)

## 2009-07-23 ENCOUNTER — Encounter: Payer: Self-pay | Admitting: Family Medicine

## 2009-07-27 ENCOUNTER — Telehealth: Payer: Self-pay | Admitting: Family Medicine

## 2010-01-10 ENCOUNTER — Telehealth: Payer: Self-pay | Admitting: Family Medicine

## 2010-02-24 ENCOUNTER — Encounter: Payer: Self-pay | Admitting: Family Medicine

## 2010-02-24 DIAGNOSIS — M1990 Unspecified osteoarthritis, unspecified site: Secondary | ICD-10-CM | POA: Insufficient documentation

## 2010-03-01 ENCOUNTER — Telehealth: Payer: Self-pay | Admitting: Family Medicine

## 2010-03-02 ENCOUNTER — Emergency Department (HOSPITAL_COMMUNITY): Admission: EM | Admit: 2010-03-02 | Discharge: 2010-03-02 | Payer: Self-pay | Admitting: Family Medicine

## 2010-03-02 ENCOUNTER — Emergency Department (HOSPITAL_COMMUNITY)
Admission: EM | Admit: 2010-03-02 | Discharge: 2010-03-02 | Payer: Self-pay | Source: Home / Self Care | Admitting: Emergency Medicine

## 2010-03-16 ENCOUNTER — Encounter: Payer: Self-pay | Admitting: Family Medicine

## 2010-04-22 ENCOUNTER — Telehealth: Payer: Self-pay | Admitting: Family Medicine

## 2010-05-11 ENCOUNTER — Encounter: Payer: Self-pay | Admitting: Family Medicine

## 2010-05-11 ENCOUNTER — Ambulatory Visit: Payer: Self-pay | Admitting: Family Medicine

## 2010-05-12 ENCOUNTER — Encounter: Payer: Self-pay | Admitting: Family Medicine

## 2010-05-12 LAB — CONVERTED CEMR LAB
Albumin: 4.2 g/dL (ref 3.5–5.2)
Alkaline Phosphatase: 75 units/L (ref 39–117)
BUN: 10 mg/dL (ref 6–23)
Creatinine, Ser: 0.99 mg/dL (ref 0.40–1.20)
Glucose, Bld: 137 mg/dL — ABNORMAL HIGH (ref 70–99)
HCT: 49.9 % — ABNORMAL HIGH (ref 36.0–46.0)
Hemoglobin: 16.6 g/dL — ABNORMAL HIGH (ref 12.0–15.0)
MCHC: 33.3 g/dL (ref 30.0–36.0)
MCV: 88.3 fL (ref 78.0–100.0)
Potassium: 4 meq/L (ref 3.5–5.3)
RBC: 5.65 M/uL — ABNORMAL HIGH (ref 3.87–5.11)
Total Bilirubin: 0.6 mg/dL (ref 0.3–1.2)

## 2010-07-15 ENCOUNTER — Ambulatory Visit: Payer: Self-pay | Admitting: Family Medicine

## 2010-07-18 ENCOUNTER — Encounter: Admission: RE | Admit: 2010-07-18 | Discharge: 2010-07-18 | Payer: Self-pay | Admitting: Family Medicine

## 2010-08-02 ENCOUNTER — Telehealth: Payer: Self-pay | Admitting: *Deleted

## 2010-08-03 ENCOUNTER — Telehealth: Payer: Self-pay | Admitting: *Deleted

## 2010-08-03 ENCOUNTER — Encounter: Payer: Self-pay | Admitting: Emergency Medicine

## 2010-08-04 ENCOUNTER — Inpatient Hospital Stay (HOSPITAL_COMMUNITY): Admission: EM | Admit: 2010-08-04 | Discharge: 2010-08-07 | Payer: Self-pay | Admitting: Family Medicine

## 2010-08-09 ENCOUNTER — Telehealth: Payer: Self-pay | Admitting: Family Medicine

## 2010-08-11 ENCOUNTER — Ambulatory Visit: Payer: Self-pay | Admitting: Family Medicine

## 2010-08-23 ENCOUNTER — Ambulatory Visit: Payer: Self-pay | Admitting: Family Medicine

## 2010-08-23 ENCOUNTER — Encounter: Payer: Self-pay | Admitting: Family Medicine

## 2010-08-23 DIAGNOSIS — H269 Unspecified cataract: Secondary | ICD-10-CM

## 2010-08-23 DIAGNOSIS — S91309A Unspecified open wound, unspecified foot, initial encounter: Secondary | ICD-10-CM | POA: Insufficient documentation

## 2010-08-23 DIAGNOSIS — R269 Unspecified abnormalities of gait and mobility: Secondary | ICD-10-CM | POA: Insufficient documentation

## 2010-08-31 ENCOUNTER — Ambulatory Visit: Payer: Self-pay | Admitting: Family Medicine

## 2010-09-08 ENCOUNTER — Ambulatory Visit: Admission: RE | Admit: 2010-09-08 | Discharge: 2010-09-08 | Payer: Self-pay | Source: Home / Self Care

## 2010-09-21 ENCOUNTER — Encounter: Payer: Self-pay | Admitting: Family Medicine

## 2010-09-26 ENCOUNTER — Encounter: Payer: Self-pay | Admitting: Family Medicine

## 2010-10-06 NOTE — Progress Notes (Signed)
Summary: Rx Req  Phone Note Refill Request Call back at Home Phone [phone] Message from:  Patient  Refills Requested: Medication #1:  FLONASE 50 MCG/ACT  SUSP 2 squirts per nostril daily PHARMACY [facility] WANTS 90 DAY SUPPLY.  Initial call taken by: Clydell Hakim,  Jan 10, 2010 10:27 AM Caller: Patient  Follow-up for Phone Call        prescription sent.  would you also ask her to schedule an appointment.  she has not been seen in a little while.  thanks Follow-up by: Asher Muir MD,  Jan 11, 2010 6:14 AM    Prescriptions: Aleda Grana 50 MCG/ACT  SUSP (FLUTICASONE PROPIONATE) 2 squirts per nostril daily  #3 x 4   Entered and Authorized by:   Asher Muir MD   Signed by:   Asher Muir MD on 01/11/2010   Method used:   [facility]ronically [facility]        [facility]  [address] [id number] ([facility])       [address]       [place], [place]  [address]       Ph: [phone] or [phone]       Fax: [phone]   RxID:   [id number] FLONASE 50 MCG/ACT  SUSP (FLUTICASONE PROPIONATE) 2 squirts per nostril daily  #3 x 4   Entered and Authorized by:   Asher Muir MD   Signed by:   Asher Muir MD on 01/11/2010   Method used:   Electronically to        [facility]* (retail)       [address] [place], [place]  [address]       Ph: [phone] or [phone]       Fax: [phone]   RxID:   [id number]   Appended Document: Rx Req Pt notified that rx was at pharmacy and that she needed an appointment.  Pt stated understanding, but said she would call back for appointment.

## 2010-10-06 NOTE — Progress Notes (Signed)
Summary: Triage call  Phone Note Call from Patient   Caller: [name] - husband Summary of Call: Husband states that his wife's condition has derioriated and wanted to know what to do.  He found out that on Sunday evening she took too many Immodium and spent the better part of the day on Monday sleeping.  By Tuesday she began to show signs of confusion and today he's not even able to get her out of the bed.  During the converation I could hear her moaning in the background.  He is able to get fluids in her but nothing else.  Told him he needed to call EMS and have her transported to the ED.  Husband agreeable. Initial call taken by: Dennison Nancy RN,  August 03, 2010 1:29 PM     Appended Document: AMS and PNA/UTI R2 note For complete details, please see R1 note above HPI: History obtained from ED records and clinic records as pt is unable to give events leading up to admission.  Per clinic triage note from 08/03/10, pt's husband called in concerned that pt had taken too many Imodium on Sunday and was becoming increasing confused and had stopped getting  out of bed or eating solid foods. Pt has significant hx of unintentional overdose with narcotics and with immodium.  Pt's husband was advised to take her to the ED where she was found to be confused but conversant.  Lab work was significant for a UDS positive for benzos and opiates, a negative Tylenol and salicylate level, a CXR showing multilobar PNA, and a U/A showing a likely UTI.  Pt was transferred to [facility] to the stepdown unit where we took over her care.  On exam, pt was confused and disoriented but not somnolent, able to follow commands. PE: BP 130/90 P 92 R 22 T 99.3 sat 94% on 4L GEN: NAD, sitting up in bed, able to talk and follow instructions HEENT: pupils enlarged but responsive, no scleral icterus, no injection, EOMI, MMM Neck: supple, no lymphadenopathy CV: RRR no m/r/g Lungs: CTAB, exam limited by body habitus ABD: soft, nontender,  obese, BS positive Ext: trace edema BLE, left foot with blister to lateral edge without surrounding erythema Neuro: CN II-XII intact, follows commands, finger to nose intact, strength 5/5 bilaterally upper and lower, not oriented to place or time. A/P: Pt is a 65 y/o with multiple past admissions for nonintentional overdose and AMS now presenting with AMS in setting of PNA, UTI and possible hx of immodium overdose. 1.   AMS- unclear if this is due to her immodium overdose or her infections.  ? whether she could have aspirated after her immodium ingestion.  If this AMS is due to her ingestion on Sunday, she should have cleared most of it from her system by now.  If it is due to her PNA, should improve with treatment.  Plan- treat PNA/UTI.  Pt received avelox and CTX in the ED.  Will start vanc and zosyn here for multilobar PNA until Cx results come back.  Will provide supportive care with close supervision in stepdown until AMS improves.  Fall Precautions.  Consider PT/OT eval once less altered. 2.   PNA- read as multilobar on CXR.  Will consider aspiration as possible cause.  Treat with Vanc/ Zosyn for now.  Swallow study before diet.  Currently on 5 L of O2.  Titrate O2 for sats > 92%.   3.   UTI- will treat with abx as above.  UCx  ordered in ED.   4.   Chronic pain- will hold most of her pain medications for now.  Will give Tylenol until pt is closer to baseline.  Would like to reduce her home medication list to limit sedating medications. 5.   Depression/anxiety- continue Cymbalta and klonipin per home doses.  Hold rozerem, seroquel. 6.   Hypothyroidism- continue synthroid 7.   GERD- continue protonix 8.   PPX- Heparin 5000 units SQ for DVT ppx 9.   Dispo- once pt is clinically improved, will have PT/OT and SW to evaluate.  Would like these consultants to advise if more help is required in the home to prevent further occurrences of overdose.

## 2010-10-06 NOTE — Assessment & Plan Note (Signed)
Summary: APPT FOR GAIT EVAL PER DR BRISCOE/RH   Vital Signs:  Patient profile:   65 year old female Height:      61.25 inches Weight:      252.7 pounds Pulse rate:   110 / minute BP sitting:   162 / 68  (right arm)  Vitals Entered By: Renato Battles slade,cma CC: gait evaluation. headache x few days. Is Patient Diabetic? No Pain Assessment Patient in pain? yes     Location: head Intensity: 6   Primary Care Provider:  Delbert Harness MD  CC:  gait evaluation. headache x few days.Marland Kitchen  History of Present Illness: Patient finds it difficult to walk up the stairs in the home,  particularly from the garage to first floor, 13 stairs. She would like a lift to help her get up and down.  A prescription would enable then to deduct the cost from their taxes. She does do the steps at other locations in the home, but they are less in number.  Has seen orthopedics for osteoarthritis of knees.  Ulcer on left foot is healed.   Husband reports that they have been trying over the years to wean her off her psychotropic medications. He says she is much more alert since her Tizantidine dose was decreased during the last hospitalization.   Habits & Providers  Alcohol-Tobacco-Diet     Tobacco Status: never  Current Medications (verified): 1)  Cymbalta 60 Mg  Cpep (Duloxetine Hcl) .... Per Dr. Evelene Croon, Take 1 Tab Po Daily 2)  Klonopin 1 Mg  Tabs (Clonazepam) .... Per Dr. Evelene Croon, Take 1 Tab By Mouth Twice Daily 3)  Seroquel 50 Mg Tabs (Quetiapine Fumarate) .Marland Kitchen.. 1 By Mouth At Bedtime For Sleep 4)  Rozerem 8 Mg  Tabs (Ramelteon) .... Per Dr Evelene Croon, Take 1 Tab At Bedtime 5)  Tizanidine Hcl 4 Mg  Tabs (Tizanidine Hcl) .... Per Dr. Conrad Meigs, 1/2 Tab Qam, 1/2 Tab At Supper 6)  Lyrica 100 Mg Caps (Pregabalin) .... Per Dr. Celedonio Miyamoto, 1 Tab By Mouth Tid 7)  Synthroid 112 Mcg Tabs (Levothyroxine Sodium) .Marland Kitchen.. 1 Tab By Mouth Daily For Thyroid 8)  Pantoprazole Sodium 40 Mg  Tbec (Pantoprazole Sodium) .... Take 1 Tab Daily 9)   Oxycontin 20 Mg Xr12h-Tab (Oxycodone Hcl) .... Per Dr. Conrad [place] One Tab By Mouth Qid 10)  Frova 2.5 Mg  Tabs (Frovatriptan Succinate) .... Per Dr. Conrad [place], Take As Needed For Migraine 11)  Ondansetron Hcl 4 Mg  Tabs (Ondansetron Hcl) .... Per Dr. Loreta Ave Take As Needed For Migraine 12)  Flonase 50 Mcg/act  Susp (Fluticasone Propionate) .... 2 Squirts Per Nostril Daily 13)  Oxyir 5 Mg  Caps (Oxycodone Hcl) .... Per Dr. Ethelene Hal, [place]ke 1 Tab Q 4 Hours Prn 14)  Align   Caps (Misc Intestinal Flora Regulat) .... Per Dr. Loreta Ave, Take 1 Daily 15)  Centrum Silver   Tabs (Multiple Vitamins-Minerals) 16)  500 Mg  Caps (Omega-3 Fatty Acids) 17)  Ferrous Sulfate 325 (65 Fe) Mg Tabs (Ferrous Sulfate) .... One Tab By Mouth Daily 18)  Citracal/vitamin D 250-200 Mg-Unit Tabs (Calcium Citrate-Vitamin D) .... One Tab By Mouth Two Times A Day 19)  Vitamin D3 2000 Unit Caps (Cholecalciferol) .... One Tab Po Qhs 20)  Co Q-10 300 Mg Caps (Coenzyme Q10) .... One Tab By Mouth Qd 21)  Aspirin Ec Low Dose 81 Mg Tbec (Aspirin) .... One Tab By Mouth Qd 22)  Tylenol 8 Hour 650 Mg Cr-Tabs (Acetaminophen) .... Q 6 Hours As Needed.  Max 6 Per Day. 23)  Sam-E 200 Mg Tabs (S-Adenosylmethionine) .... Per Patient 24)  Furosemide 20 Mg Tabs (Furosemide) .... Take One Tablet Daily 25)  Zostavax [address] Unt/0.20ml Solr (Zoster Vaccine Live) .... Administer One Vaccine 26)  Tub Bench .... Patient With Gait Abnormality and Bilateral Osteoarthritis 27)  Lift Up Stairs .... Gait Abnormality and Bilateral Osteoarthritis of The Knees 28)  Ibuprofen 600 Mg Tabs (Ibuprofen) .... One Two Times A Day As Needed For A Flair of Pain 29)  Zostavax 60454 Unt/0.63ml Solr (Zoster Vaccine Live) .... Once  Allergies (verified): 1)  ! Wellbutrin 2)  * Azithromycin 3)  Erythromycin  Review of Systems      See HPI MS:  Complains of joint pain and low back pain.  Physical Exam  General:  Alert, morbildy obese.   Lungs:  Normal respiratory effort,  chest expands symmetrically. Lungs are clear to auscultation, no crackles or wheezes. Heart:  Normal rate and regular rhythm. S1 and S2 normal without gallop, murmur, click, rub or other extra sounds. Msk:  Full range of motion of hips. Knees enlarged with no focal tenderness or effusion.   Feet: compressed into shoes with pressure redness over mildly hammered middle toes, ulceration healed. Extremities:  trace left pedal edema and trace right pedal edema.   Neurologic:  Able to go from sit to stand and stand to sit x 8 without using her arms. Psych:  Intermittently seems to have memory lapses and looks to husband. 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    Impression & Recommendations:  Problem # 1:  ABNORMALITY OF GAIT (ICD-781.2)  Felt it reasonable to provide prescription for lift from garage entry basement to first floor.  Encouraged stregthening exercises, and weight loss.  Orders: [facility]- Est  Level 4 ([address])  Problem # 2:  WOUND, FOOT (ICD-892.0)  completely resolved, discussed different shaped shoes to prevent pressure and hammer toes development.  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 Per dr. Conrad [place] one tab by mouth qid    Oxyir 5 Mg Caps (Oxycodone hcl) .Marland Kitchen... Per dr. Ethelene Hal, takke 1 tab q 4 hours prn    Aspirin Ec Low Dose 81 Mg Tbec (Aspirin) ..... One tab by mouth qd    Tylenol 8 Hour 650 Mg Cr-tabs (Acetaminophen) ..... Q 6 hours as needed.  max 6 per day.    Ibuprofen 600 Mg Tabs (Ibuprofen) ..... One two times a day as needed for a flair of pain  Orders: [facility]- Est  Level 4 ([address])  Problem # 4:  MEMORY LOSS (ICD-780.93)  Scored 28/30 when tested last month.  Likely  related to slow in central processing related to psychotropic and centrally acting analgesic pharmacology.  This was discussed.  Recommended asking psychiatrist to switch to shorter acting benzo.  Orders: [facility]- Est  Level 4 ([address])  Complete Medication List: 1)  Cymbalta 60 Mg Cpep (Duloxetine hcl) .... Per dr. Evelene Croon, take 1 tab po daily 2)  Klonopin 1 Mg Tabs (Clonazepam) .... Per dr. Evelene Croon, take 1 tab by mouth twice daily 3)  Seroquel 50 Mg Tabs (Quetiapine fumarate) .Marland Kitchen.. 1 by mouth at bedtime for sleep 4)  Rozerem 8 Mg Tabs (  Ramelteon) .... Per dr Evelene Croon, take 1 tab at bedtime 5)  Tizanidine Hcl 4 Mg Tabs (Tizanidine hcl) .... Per dr. Conrad [place], 1/2 tab qam, 1/2 tab at supper 6)  Lyrica 100 Mg Caps (Pregabalin) .... Per dr. Celedonio Miyamoto, 1 tab by mouth tid 7)  Synthroid 112 Mcg Tabs (Levothyroxine sodium) .Marland Kitchen.. 1 tab by mouth daily for thyroid 8)  Pantoprazole Sodium 40 Mg Tbec (Pantoprazole sodium) .... Take 1 tab daily 9)  Oxycontin 20 Mg Xr12h-tab (Oxycodone hcl) .... Per dr. Conrad [place] one tab by mouth qid 10)  Frova 2.5 Mg Tabs (Frovatriptan succinate) .... Per dr. Conrad [place], take as needed for migraine 11)  Ondansetron Hcl 4 Mg Tabs (Ondansetron hcl) .... Per dr. Loreta Ave take as needed for migraine 12)  Flonase 50 Mcg/act Susp (Fluticasone propionate) .... 2 squirts per nostril daily 13)  Oxyir 5 Mg Caps (Oxycodone hcl) .... Per dr. Ethelene Hal, takke 1 tab q 4 hours prn 14)  Align Caps (Misc intestinal flora regulat) .... Per dr. Loreta Ave, take 1 daily 15)  Centrum Silver Tabs (Multiple vitamins-minerals) 16)  500 Mg Caps (omega-3 Fatty Acids)  17)  Ferrous Sulfate 325 (65 Fe) Mg Tabs (Ferrous sulfate) .... One tab by mouth daily 18)  Citracal/vitamin D 250-200 Mg-unit Tabs (Calcium citrate-vitamin d) .... One tab by mouth two times a day 19)  Vitamin D3 2000 Unit Caps (Cholecalciferol) .... One tab po qhs 20)  Co Q-10 300 Mg Caps (Coenzyme q10) .... One tab by mouth qd 21)  Aspirin Ec Low Dose 81 Mg  Tbec (Aspirin) .... One tab by mouth qd 22)  Tylenol 8 Hour 650 Mg Cr-tabs (Acetaminophen) .... Q 6 hours as needed.  max 6 per day. 23)  Sam-e 200 Mg Tabs (S-adenosylmethionine) .... Per patient 24)  Furosemide 20 Mg Tabs (Furosemide) .... Take one tablet daily 25)  Zostavax 04540 Unt/0.62ml Solr (Zoster vaccine live) .... Administer one vaccine 26)  Tub Bench  .... Patient with gait abnormality and bilateral osteoarthritis 27)  Lift Up Stairs  .... Gait abnormality and bilateral osteoarthritis of the knees 28)  Ibuprofen 600 Mg Tabs (Ibuprofen) .... One two times a day as needed for a flair of pain 29)  Zostavax 98119 Unt/0.30ml Solr (Zoster vaccine live) .... Once  Patient Instructions: 1)  Sit to stand exercises multiple times per day, keep walker near for balance. 2)  Perform toe to heel exercises 3)  Think about shoes with a wide toe box that will cause less pressure on your toes. 4)  May intermittently use ibuprofen once in a while for a flair , the 600 mg was sent into your pharmacy 5)  Recommend getting the Zostavax immunization-script provided 6)  Please schedule a follow-up appointment in 1 month with Dr. Earnest Bailey.  Prescriptions: ZOSTAVAX 14782 UNT/0.65ML SOLR (ZOSTER VACCINE LIVE) once Brand medically necessary #1 x 0   Entered and Authorized by:   Zachery Dauer MD   Signed by:   Zachery Dauer MD on 09/08/2010   Method used:   Print then Give to Patient   RxID:   [id number] IBUPROFEN 600 MG TABS (IBUPROFEN) one two times a day as needed for a flair of pain  #60 x 2   Entered and Authorized by:   Zachery Dauer MD   Signed by:   Zachery Dauer MD on 09/08/2010   Method used:   [facility]        [facility]  [address] [phone][facility]ail[facility]       [address]  [place], [place]  [address]       Ph: [phone] or [phone]       Fax: [phone]   RxID:   [id number] IBUPROFEN 600 MG TABS (IBUPROFEN) one two times a day as needed for a flair of pain  #60  x 3   Entered and Authorized by:   Zachery Dauer MD   Signed by:   Zachery Dauer MD on 09/08/2010   Method used:   Print then Give to Patient   RxID:   [id number] LIFT UP STAIRS gait abnormality and bilateral osteoarthritis of the knees Brand medically necessary #1 x 0   Entered and Authorized by:   Zachery Dauer MD   Signed by:   Zachery Dauer MD on 09/08/2010   Method used:   Print then Give to Patient   RxID:   [id number] TUB BENCH patient with gait abnormality and bilateral osteoarthritis Brand medically necessary #1 x 0   Entered and Authorized by:   Zachery Dauer MD   Signed by:   Zachery Dauer MD on 09/08/2010   Method used:   Print then Give to Patient   RxID:   [id number]    Orders Added: 1)  [facility]- Est  Level 4 [[address]]      Prevention & Chronic Care Immunizations   Influenza vaccine: Fluvax MCR  (07/15/2010)   Influenza vaccine due: 06/09/2008    Tetanus booster: 02/03/2003: Done.   Tetanus booster due: 02/02/2013    Pneumococcal vaccine: Pneumovax (Medicare)  (08/11/2010)    H. zoster vaccine: Not documented  Colorectal Screening   Hemoccult: Done.  (09/04/2004)   Hemoccult action/deferral: Not indicated  (09/08/2010)   Hemoccult due: 09/04/2005    Colonoscopy: 11/2006 Ischemic Colitis  (12/26/2006)   Colonoscopy due: 12/25/2016  Other Screening   Pap smear: Not documented   Pap smear action/deferral: Not indicated S/P hysterectomy  (07/22/2009)    Mammogram: BI-RADS CATEGORY 1:  Negative.^MM DIGITAL DIAGNOSTIC BILAT  (07/18/2010)   Mammogram due: 12/26/2007    DXA bone density scan: Done.  (02/02/2005)   DXA scan due: None    Smoking status: never  (09/08/2010)  Lipids   Total Cholesterol: 175  (04/15/2009)   LDL: 65  (04/15/2009)   LDL Direct: Not documented   HDL: 77  (04/15/2009)   Triglycerides: 164  (04/15/2009)

## 2010-10-06 NOTE — Progress Notes (Signed)
  Phone Note Call from Patient   Caller: Spouse-Glenn Call For: [phone] Summary of Call: Mr. Lindseth is calling to ask for a rx to have a stair lift installed to the stairs inside of home to make it easier for his wife to go up and down. Initial call taken by: Abundio Miu,  August 09, 2010 1:55 PM  Follow-up for Phone Call        Will discuss at follow-up appt Follow-up by: Delbert Harness MD,  August 09, 2010 7:01 PM

## 2010-10-06 NOTE — Consult Note (Signed)
Summary: [facility]: Migraine  [facility]   Imported By: Clydell Hakim 03/31/2010 15:12:47  _____________________________________________________________________  External Attachment:    Type:   Image     Comment:   External Document

## 2010-10-06 NOTE — Letter (Signed)
Summary: Generic Letter  [facility]  [address]   [place], [place] [address]   Phone: [phone]  Fax: [phone]    02/24/2010  SHIQUITA COLLIGNON [address] [place], [place]  [address]  Dear Ms. Picco,    You have not been seen in our office in some time.  Please call [phone] and schedule an office visit.  I look forward to seeing you.        Sincerely,   Asher Muir MD  Appended Document: Generic Letter MAILED.

## 2010-10-06 NOTE — Letter (Signed)
Summary: *Referral Letter  [facility]  [address]   [place], [place] [address]   Phone: [phone]  Fax: [phone]    08/23/2010  Dear Dr. Elmer Picker,  I am writing to report my evaluation of [name], a 65 Years Old Female on whom I saw on 08/11/2010 in our office for evaluation of left foot blister.  She stated she was planning on having left cataract surgery on December 27th and requested that I send you a letter in regards to preoperative management.  Her blood pressure is well contolled at 124/83 and does not take any alpha blockers or antithrombotics agents to my knowledge.  I do not anticipate any problems with sedation.  Please let me know if I can be of further help.  Current Medical Problems: 1)  OSTEOARTHRITIS, GENERALIZED (ICD-715.00) 2)  ? 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 Evelene Croon, take 1 tab PO daily 2)  KLONOPIN 1 MG  TABS (CLONAZEPAM) per dr. Evelene Croon, take 1 tab by mouth twice daily 3)  SEROQUEL 50 MG TABS (QUETIAPINE FUMARATE) 1 by mouth at bedtime for sleep 4)  ROZEREM 8 MG  TABS (RAMELTEON) per dr Evelene Croon, take 1 tab at bedtime 5)  TIZANIDINE HCL 4 MG  TABS (TIZANIDINE HCL) per dr. Conrad [place], 1/2 tab qam, 1/2 tab at supper 6)  LYRICA 100 MG CAPS (PREGABALIN) per Dr. Celedonio Miyamoto, 1 tab by mouth tid 7)  SYNTHROID 112 MCG TABS  (LEVOTHYROXINE SODIUM) 1 tab by mouth daily for thyroid 8)  PANTOPRAZOLE SODIUM 40 MG  TBEC (PANTOPRAZOLE SODIUM) take 1 tab daily 9)  OXYCONTIN 20 MG XR12H-TAB (OXYCODONE HCL) per dr. Conrad [place] One tab by mouth QID 10)  FROVA 2.5 MG  TABS (FROVATRIPTAN SUCCINATE) per dr. Conrad , take as needed for migraine 11)  ONDANSETRON HCL 4 MG  TABS (ONDANSETRON HCL) per dr. Loreta Ave take as needed for migraine 12)  FLONASE 50 MCG/ACT  SUSP (FLUTICASONE PROPIONATE) 2 squirts per nostril daily 13)  OXYIR 5 MG  CAPS (OXYCODONE HCL) per dr. Ethelene Hal, takke 1 tab q 4 hours prn 14)  ALIGN   CAPS (MISC INTESTINAL FLORA REGULAT) per dr. Loreta Ave, take 1 daily 15)  CENTRUM SILVER   TABS (MULTIPLE VITAMINS-MINERALS)  16)  * 500 MG  CAPS (OMEGA-3 FATTY ACIDS)  17)  FERROUS SULFATE 325 (65 FE) MG TABS (FERROUS SULFATE) One tab by mouth daily 18)  CITRACAL/VITAMIN D 250-200 MG-UNIT TABS (CALCIUM CITRATE-VITAMIN D) One tab by mouth two times a day 19)  VITAMIN D3 2000 UNIT CAPS (CHOLECALCIFEROL) One tab Po qHS 20)  CO Q-10 300 MG CAPS (COENZYME Q10) One tab by mouth qd 21)  ASPIRIN EC LOW DOSE 81 MG TBEC (ASPIRIN) One tab by mouth qd 22)  TYLENOL 8 HOUR 650 MG CR-TABS (ACETAMINOPHEN) q 6 hours as needed.  Max 6 per day. 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 Matter changes, focal dilatation L vert. Artery (05/2004) 4)  Anxiety/Depression  5)  psychiatrist--Dr Evelene Croon 6)  admission 03/2009-nonintentional narcotic overdose 7)  Admission 06/2009 for overdose of 100 tabs of immodium 8)  GASTROINTESTINAL  ISSUES 9)  Hx of mult abd surgeries, + hx of adhesions.  10)  Ischemic Colitis dx by colonoscopy Dr. 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 Sees Dr. Violeta Gelinas 16)  Right ankle fx(12/2003)  17)  Right spiral tibia fracture (06/2006) 18)  fracture 2009 19)  Sees Dr Ethelene Hal (PM&R physician with ortho) 20)  GENERAL MED/ENDOCRINE ISSUES 21)  Hypothryroidism 22)  Obesity 23)  ARF from Rhabo required HD x 2 --> resolved with normal renal US (09/2004) ; again from hypotension after unintentional overdose 24)  Hospitalized for hypotension 9/05  25)  hospitalized with unintentional overdose X 3 2009-2010 26)  LAST ECHO 27)  7/10:  normal left ventricular systolic function, EF of 60- 65%, moderate diastolic dysfunction, mild pulmonary hypertension, and mild right ventricular dilatation.  Sincerely,  Delbert Harness MD

## 2010-10-06 NOTE — Assessment & Plan Note (Signed)
 Summary: hosp f/u & HGB/[facility] [place]   Vital Signs:  Patient profile:   65 year old female Height:      61.25 inches Weight:      262.38 pounds BMI:     49.35 Temp:     98.1 degrees F oral Pulse rate:   102 / minute Pulse rhythm:   regular BP sitting:   122 / 84  (right arm)  Vitals Entered By: Niels Porto LPN (March 09, 7988 1:37 PM) CC: Hospital DC follow up visit.  Hgb pending.   Primary Care Provider:  DEVERE FRUITS MD  CC:  Hospital DC follow up visit.  Hgb pending.SABRA  History of Present Illness: 77F with MMP comes in for hospital fu and eval/fu of her anemia.  Hospital:  Was admitted unresponsive 2/2 unintentional narcotic overdose.  Perked up with narcan.  Now her and her husband have been trying to slowly wean off all medications that cause drowsiness.  They are very insightful and have been doing a good job decreasing her Klonopin , seroquel , lyrica , oxycontin  and oxycodone .  She has not had any further episodes since discharge.  Of note, had ARF and rhabdomyolysis that resolved by discharge.  Microcytic anemia:  Was found to be very anemic with hb 7.2 in hospital.  Was started on FeSO4 325 two times a day at discharge, however they have not yet filled this prescription.  Hb checked today in office at 8.8.  Pt feeling fatigued and weak but no SOB/CP.  No syncope and no orthostasis.  Anemia panel done in May 2010 and consistent with Iron deficiency.  No abd pain/epigastric tenderness, melena, N/V/D/C.  I spend  ~20 minutes with this family in face to face time.  Review of Systems       See hPI  Physical Exam  General:  Well-developed,well-nourished,in no acute distress; alert,appropriate and cooperative throughout examination Lungs:  Normal respiratory effort, chest expands symmetrically. Lungs are clear to auscultation, no crackles or wheezes. Heart:  Normal rate and regular rhythm. S1 and S2 normal without gallop, murmur, click, rub or other extra sounds. Abdomen:  Bowel  sounds positive,abdomen soft and non-tender without masses, organomegaly or hernias noted. Extremities:  No clubbing, cyanosis, edema, or deformity noted with normal full range of motion of all joints.     Impression & Recommendations:  Problem # 1:  ANEMIA, IRON DEFICIENCY (ICD-280.9) Assessment Improved Hb has actually improved significantly since discharge.  Emphasized the importance of filling the Rx for iron and repleting stores.  Would have pt come back in 6 weeks to see Dr. Fruits and I have placed an order for another hemoglobin prior to her visit with Dr. Fruits.  No signs or symptoms of blood loss.    Her updated medication list for this problem includes:    Ferrous Sulfate 325 (65 Fe) Mg Tabs (Ferrous sulfate) ..... One tab by mouth bid  Orders: Hemoglobin-[facility] (14981) [facility]- Est Level  3 (00786)  Future Orders: Hemoglobin-[facility] (14981) ... 03/08/2010  Problem # 2:  ALTERED MENTAL STATUS (ICD-780.97) Assessment: Improved Resolved.  They are weaning off meds that can worsen her mental status.  I went over every medication with them and the med list is now fully updated with new meds added since DC.  Orders: [facility]- Est Level  3 (00786)  Complete Medication List: 1)  Cymbalta  60 Mg Cpep (Duloxetine  hcl) .... Per dr. vincente, take 1 tab po daily 2)  Klonopin  1 Mg Tabs (Clonazepam ) .... Per dr. vincente,  take 1 tab by mouth once daily 3)  Seroquel  100 Mg Tabs (Quetiapine  fumarate) .... Per dr. vincente, take 1/2 tab by mouth qhs 4)  Rozerem  8 Mg Tabs (Ramelteon ) .... Per dr vincente, take 1 tab at bedtime 5)  Tizanidine  Hcl 4 Mg Tabs (Tizanidine  hcl) .... Per dr. jere, 1/2 tab qam, 1/2 tab at supper 6)  Lyrica  100 Mg Caps (Pregabalin ) .... Per dr. deeann, 1 tabs in am and  1tab by mouth qhs 7)  Synthroid  100 Mcg Tabs (Levothyroxine  sodium) .... Take 1 tab daily 8)  Pantoprazole  Sodium 40 Mg Tbec (Pantoprazole  sodium) .... Per dr. kristie, take 1 tab daily 9)  Oxycontin  20 Mg  Xr12h-tab (Oxycodone  hcl) .... One tab by mouth bid 10)  Boniva 150 Mg Tabs (Ibandronate sodium) .... Per dr. bonner, take 1 tab monthly 11)  Frova 2.5 Mg Tabs (Frovatriptan succinate) .... Per dr. jere, take as needed for migraine 12)  Ondansetron  Hcl 4 Mg Tabs (Ondansetron  hcl) .... Per dr. kristie take as needed for migraine 13)  Flonase  50 Mcg/act Susp (Fluticasone  propionate) .... 2 squirts per nostril daily 14)  Oxyir 5 Mg Caps (Oxycodone  hcl) .... Per dr. bonner, takke 1 tab q 4 hours prn 15)  Align Caps (Misc intestinal flora regulat) .... Per dr. kristie, take 1 daily 16)  Centrum Silver Tabs (Multiple vitamins-minerals) 17)  500 Mg Caps (omega-3 Fatty Acids)  18)  Mobic  7.5 Mg Tabs (Meloxicam ) .... Take 1 tab every 12 hours as needed for pain.  take with food.  use least amount that is effective 19)  Ferrous Sulfate 325 (65 Fe) Mg Tabs (Ferrous sulfate) .... One tab by mouth bid 20)  Citracal/vitamin D  250-200 Mg-unit Tabs (Calcium  citrate-vitamin d ) .... One tab by mouth two times a day 21)  Vitamin D3 2000 Unit Caps (Cholecalciferol ) .... One tab po qhs 22)  Co Q-10 300 Mg Caps (Coenzyme q10) .... One tab by mouth qd 23)  Aspirin  Ec Low Dose 81 Mg Tbec (Aspirin ) .... One tab by mouth qd  Patient Instructions: 1)  Nice to meet you two today, 2)  Fill your Iron and take it as directed. 3)  Come back to see Dr. Edrick in 6 weeks.  I want you to have your hemoglobin checked again in 6 weeks the day of your visit with dr edrick. 4)  Drink lots >8 glasses of water. 5)  -Dr. ONEIDA.  Laboratory Results   Blood Tests   Date/Time Received: March 09, 2009 1:37 PM  Date/Time Reported: March 09, 2009 1:41 PM     CBC   HGB:  8.8 g/dL   (Normal Range: 86.9-82.9 in Males, 12.0-15.0 in Females) Comments: ...........test performed by...........SABRAArland Morel, CMA

## 2010-10-06 NOTE — Assessment & Plan Note (Signed)
Summary: fell and hurt left breast,df   Vital Signs:  Patient profile:   65 year old female Weight:      260.1 pounds Pulse rate:   100 / minute BP sitting:   136 / 79  (right arm)  Vitals Entered By: Arlyss Repress CMA, (July 15, 2010 11:27 AM) CC: right breast pain after falling on wednesday. implants 1972 Is Patient Diabetic? No Pain Assessment Patient in pain? yes     Location: right breast Intensity: 9 Onset of pain  wed   Primary Care Provider:  Delbert Harness MD  CC:  right breast pain after falling on wednesday. implants 1972.  History of Present Illness: 65 y/o F with multiple medical conditions including multiple pain complaints, intractable migraine, who comes in for R breast pain after falling on Wed.  She is here with her husband, who helps provide some of the history.    R breast pain: On Wed pt received dental extraction.  When she got home, Pt was leaning over to pet her dog, when she "lost her balance" and fell.  Denies any chest pain, palpitations, irregular heart beat, syncope/presyncope prior to fall.  Pt denies history of falls.  She did not hit her head or lose consciousness with the fall.  After the fall she did not have any lightheadedness, dizziness, chest pain, dypsnea, loss of bowel control, confusion.  She has not fallen since. She did feel immediate pain in R breast, as she landed on her breast.  Pain is limited to R breast, and is worse when she has to push herself out of bed.  Pain is sharp, but sometimes dull.  Right now pain is 8 out of 10.  She is also concerned because she had breast implant operation in 1972.  She wants to know if she tore any ligaments from the fall.   No fever, chills, swelling, bleeding  Chronic pain: She takes chronic pain medicine for Migraine.   Dr Charlcie Cradle, [facility]. Gives her Oxyxontin 20mg  qid, #120 tab per month. Her husband states that she has had to take more since the fall , about 5-6 tabs per  day.   Habits & Providers  Alcohol-Tobacco-Diet     Tobacco Status: never  Allergies (verified): 1)  ! Wellbutrin 2)  * Azithromycin 3)  Erythromycin  Review of Systems       per hpi   Physical Exam  General:  Well-developed,well-nourished,in no acute distress; alert,appropriate and cooperative throughout examination. vitals reviewed.  Breasts:  no hematoma. no swelling.   +tenderness to palpation on the underside of R breast, about 6-7 o'clock position.  skin/areolae normal, no abnormal thickening, no nipple discharge, and no adenopathy.  Breast implant palpabled.  Neurologic:  No cranial nerve deficits noted. Station and gait are normal. Plantar reflexes are down-going bilaterally. DTRs are symmetrical throughout. Sensory, motor and coordinative functions appear intact. Cervical Nodes:  No lymphadenopathy noted Axillary Nodes:  No palpable lymphadenopathy   Impression & Recommendations:  Problem # 1:  BREAST PAIN, RIGHT (ICD-611.71) Assessment New Fall likely not related to arrythmia, cardiac, or neurologic.  No syncope.  Will not do syncope work up.  R breast pain s/p fall.  Will get diagnostic MMG, esp in light of implant history.  Pt has appt on Monday at 8:45.   Discussed the chronic use of narcotics.  I will not be prescribing anymore pain med for her as she has oxycontin 20mg , #120 tabs per month.  Pt to  rtc or ER for fever, chills.  Other red flags given.   Orders: [facility]- Est Level  3 (16109) Mammogram (Diagnostic) (Mammo)  Complete Medication List: 1)  Cymbalta 60 Mg Cpep (Duloxetine hcl) .... Per dr. Evelene Croon, take 1 tab po daily 2)  Klonopin 1 Mg Tabs (Clonazepam) .... Per dr. Evelene Croon, take 1 tab by mouth twice daily 3)  Seroquel 50 Mg Tabs (Quetiapine fumarate) .Marland Kitchen.. 1 by mouth at bedtime for sleep 4)  Rozerem 8 Mg Tabs (Ramelteon) .... Per dr Evelene Croon, take 1 tab at bedtime 5)  Tizanidine Hcl 4 Mg Tabs (Tizanidine hcl) .... Per dr. Conrad Warsaw, 1/2 tab qam, 1/2 tab at  supper 6)  Lyrica 100 Mg Caps (Pregabalin) .... Per dr. Celedonio Miyamoto, 1 tab by mouth tid 7)  Synthroid 112 Mcg Tabs (Levothyroxine sodium) .Marland Kitchen.. 1 tab by mouth daily for thyroid 8)  Pantoprazole Sodium 40 Mg Tbec (Pantoprazole sodium) .... Take 1 tab daily 9)  Oxycontin 20 Mg Xr12h-tab (Oxycodone hcl) .... Per dr. Conrad Buckingham one tab by mouth qid 10)  Frova 2.5 Mg Tabs (Frovatriptan succinate) .... Per dr. Conrad Frost, take as needed for migraine 11)  Ondansetron Hcl 4 Mg Tabs (Ondansetron hcl) .... Per dr. Loreta Ave take as needed for migraine 12)  Flonase 50 Mcg/act Susp (Fluticasone propionate) .... 2 squirts per nostril daily 13)  Oxyir 5 Mg Caps (Oxycodone hcl) .... Per dr. Ethelene Hal, takke 1 tab q 4 hours prn 14)  Align Caps (Misc intestinal flora regulat) .... Per dr. Loreta Ave, take 1 daily 15)  Centrum Silver Tabs (Multiple vitamins-minerals) 16)  500 Mg Caps (omega-3 Fatty Acids)  17)  Ferrous Sulfate 325 (65 Fe) Mg Tabs (Ferrous sulfate) .... One tab by mouth daily 18)  Citracal/vitamin D 250-200 Mg-unit Tabs (Calcium citrate-vitamin d) .... One tab by mouth two times a day 19)  Vitamin D3 2000 Unit Caps (Cholecalciferol) .... One tab po qhs 20)  Co Q-10 300 Mg Caps (Coenzyme q10) .... One tab by mouth qd 21)  Aspirin Ec Low Dose 81 Mg Tbec (Aspirin) .... One tab by mouth qd 22)  Tylenol 8 Hour 650 Mg Cr-tabs (Acetaminophen) .... Q 6 hours as needed.  max 6 per day. 23)  Sam-e 200 Mg Tabs (S-adenosylmethionine) .... Per patient 24)  Furosemide 20 Mg Tabs (Furosemide) .... Take one tablet daily  Other Orders: Influenza Vaccine MCR (60454)  Patient Instructions: 1)  Please schedule a follow-up appointment as needed.  2)  If you have some swelling you can try to use ice on the area. 3)  We have scheduled a diagnostic mammogram for you.  Your appointment in: _______________________________.  Please keep this appoinment. 4)  I think that the best thing to do is to continue the pain medicine regimen that  was given to you by your Neurologist.  I would not want to compromise this contract and relationship.  Part of taking chronic pain medicine is deciding when the pain is bad enough to warrant the pain medicine.    Orders Added: 1)  [facility]- Est Level  3 [99213] 2)  Mammogram (Diagnostic) [Mammo] 3)  Influenza Vaccine MCR [[id number]]   Flu Vaccine Consent Questions:    Do you have a history of severe allergic reactions to this vaccine? no    Any prior history of allergic reactions to egg and/or gelatin? no    Do you have a sensitivity to the preservative Thimersol? no    Do you have a past history of Guillan-Barre Syndrome? no  Do you currently have an acute febrile illness? no    Have you ever had a severe reaction to latex? no    Vaccine information given and explained to patient? yes    Are you currently pregnant? no    Influenza Vaccine (to be given today)

## 2010-10-06 NOTE — Miscellaneous (Signed)
Summary: patient summary  Clinical Lists Changes  [name] is a complicated pt.  She has multiple issues, mostly related to pain and mental health.  She sees multiple specialists, including psychiatry, neurology, and orthopedics.  Their contact information is listed in a clinical lists update dated 04/23/09.    She has issues with memory (I am still not certain of the cause of her memory loss) and is dependent on her husband to manage her medical care.  He comes with her to all appointments.  He always brings a typed, well-organized list of her meds and who prescribes them which is very helpful.  She has had several unintentional overdoses (3, I believe), which required hospitalization.  the first 2 were from narcotics and the 3rd was presumed to be from immodium.  The plan after the last hospitalization was for her husband to lock up all medicines and he would administer.    I have tried to open up communication with her specialists because she is on very long list of meds, a number of which are controlled substances.  their has been an attempt on all of our parts to decrease her narcotics and other sedating meds.  of note, [facility] prescribes none of her controlled substances.  Problems: Removed problem of RIB PAIN, RIGHT SIDED (ICD-786.50) Removed problem of KNEE PAIN, RIGHT (ICD-719.46) Removed problem of Question of  CONTACT DERMATITIS (ICD-692.9) Removed problem of TINEA PEDIS (ICD-110.4) Changed problem from ALTERED MENTAL STATUS (ICD-780.97) to History of  ALTERED MENTAL STATUS (ICD-780.97) Changed problem from RENAL FAILURE, ACUTE (ICD-584.9) to History of  RENAL FAILURE, ACUTE (ICD-584.9) Removed problem of HYPOXEMIA (ICD-799.02) Removed problem of ALTERED MENTAL STATUS (ICD-780.97) Removed problem of LOW BLOOD PRESSURE (ICD-458.9) Changed problem from ISCHEMIC COLITIS (ICD-557.9) to History of  ISCHEMIC COLITIS (ICD-557.9) Removed problem of ARTHRALGIA, UNSPECIFIED (ICD-719.40) Removed  problem of BLOOD IN STOOL (ICD-578.1) Added new problem of OSTEOARTHRITIS, GENERALIZED (ICD-715.00) Assessed History of  ALTERED MENTAL STATUS as comment only - secondary to unintentional overdose Assessed History of  RENAL FAILURE, ACUTE as comment only - presumably secondary to hypotension after unintentional overdose.  it has since resolved.  she is on meloxicam.  I would continue this with caution.  i have explained the risks to her and her husband and encouraged her to use the least effective dose Assessed History of  ISCHEMIC COLITIS as comment only - followed by Dr. Loreta Ave, but has not been to see her in a while.  I have encouraged them to follow up with Dr. Loreta Ave on multiple ocassions, but as far as I know, they have not yet done this Assessed Question of  BASAL CELL CARCINOMA, FACE as comment only - they were advised to follow up with her dermatologist.  I am not sure what the outcome was.  should ask about next visit Assessed Symptom of  LEG EDEMA, BILATERAL as comment only Assessed ANEMIA, IRON DEFICIENCY as comment only - last iron hgb normal Her updated medication list for this problem includes:    Ferrous Sulfate 325 (65 Fe) Mg Tabs (Ferrous sulfate) ..... One tab by mouth daily  Hgb: 12.8 (07/22/2009)   Hct: 39.7 (06/03/2009)   Platelets: 248 (06/03/2009) RBC: 5.33 (06/03/2009)   RDW: 20.5 (06/03/2009)   WBC: 8.8 (06/03/2009) MCV: 74.5 (06/03/2009)   MCHC: 29.5 (06/03/2009) TSH: 2.787 (06/03/2009)  Assessed DEPENDENCE, OPIOID, CONTINUOUS as comment only -  see above discussion.  I would advise sending all ov notes and hospital records to each of her specialists,  and encouraging them to send their notes to [facility] Assessed OSTEOPENIA as comment only - managment per ortho Her updated medication list for this problem includes:    Boniva 150 Mg Tabs (Ibandronate sodium) .Marland Kitchen... Per dr. Ethelene Hal, take 1 tab monthly    Citracal/vitamin D 250-200 Mg-unit Tabs (Calcium citrate-vitamin d) ..... One  tab by mouth two times a day    Vitamin D3 2000 Unit Caps (Cholecalciferol) ..... One tab po qhs  Assessed PANIC ATTACKS as comment only - mangement per psych Her updated medication list for this problem includes:    Cymbalta 60 Mg Cpep (Duloxetine hcl) .Marland Kitchen... Per dr. Evelene Croon, take 1 tab po daily    Klonopin 1 Mg Tabs (Clonazepam) .Marland Kitchen... Per dr. Evelene Croon, take 1 tab by mouth once daily  Assessed IRRITABLE BOWEL SYNDROME as comment only - management per dr Loreta Ave Assessed HYPOTHYROIDISM, UNSPECIFIED as comment only -  this is one of the only problems we manage Her updated medication list for this problem includes:    Synthroid 112 Mcg Tabs (Levothyroxine sodium) .Marland Kitchen... 1 tab by mouth daily for thyroid  Labs Reviewed: TSH: 2.787 (06/03/2009)    Chol: 175 (04/15/2009)   HDL: 77 (04/15/2009)   LDL: 65 (04/15/2009)   TG: 164 (04/15/2009)  Assessed DEPRESSION, MAJOR, RECURRENT as comment only Assessed MIGRAINE, UNSPEC., W/O INTRACTABLE MIGRAINE as comment only -  Her updated medication list for this problem includes:    Oxycontin 20 Mg Xr12h-tab (Oxycodone hcl) ..... One tab by mouth bid    Frova 2.5 Mg Tabs (Frovatriptan succinate) .Marland Kitchen... Per dr. Conrad [place]ulee, take as needed for migraine    Oxyir 5 Mg Caps (Oxycodone hcl) .Marland Kitchen... Per dr. Ethelene Hal, takke 1 tab q 4 hours prn    Aspirin Ec Low Dose 81 Mg Tbec (Aspirin) ..... One tab by mouth qd    Meloxicam 7.5 Mg Tabs (Meloxicam) .Marland Kitchen... 1 tab by mouth daily as needed joint pain  Assessed OSTEOARTHRITIS, GENERALIZED as comment only - on meloxicam (prescribed by [facility]).  as stated above, would use with caution because of past ARF.  pt and husband aware of risks Her updated medication list for this problem includes:    Oxycontin 20 Mg Xr12h-tab (Oxycodone hcl) ..... One tab by mouth bid    Oxyir 5 Mg Caps (Oxycodone hcl) .Marland Kitchen... Per dr. Ethelene Hal, takke 1 tab q 4 hours prn    Aspirin Ec Low Dose 81 Mg Tbec (Aspirin) ..... One tab by mouth qd    Meloxicam 7.5 Mg Tabs  (Meloxicam) .Marland Kitchen... 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 Matter changes, focal dilatation L vert. Artery (05/2004) Anxiety/Depression  psychiatrist--Dr Evelene Croon  GASTROINTESTINAL ISSUES Hx of mult abd surgeries, + hx of adhesions.  Ischemic Colitis dx by colonoscopy Dr. 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 Sees Dr. Violeta Gelinas Right ankle fx(12/2003)  Right spiral tibia fracture (06/2006) fracture 2009 Sees Dr Ethelene Hal (PM&R physician with ortho)  GENERAL MED/ENDOCRINE ISSUES Hypothryroidism Obesity ARF from  [facility]o required HD x 2 --> resolved with normal renal US (09/2004) ; again from hypotension after unintentional overdose Hospitalized for hypotension 9/05  hospitalized with unintentional overdose X 3 2009-2010  LAST ECHO 7/10:  normal left ventricular systolic function, EF of 60- 65%, moderate diastolic dysfunction, mild pulmonary hypertension, and mild right ventricular dilatation. (02/24/2010 11:28)      Impression & Recommendations:  Problem # 1:  Hx of ALTERED MENTAL STATUS (ICD-780.97) Assessment Comment Only secondary to unintentional overdose  Problem # 2:  Hx of RENAL FAILURE, ACUTE (ICD-584.9) Assessment: Comment Only presumably secondary to hypotension after unintentional overdose.  it has since resolved.  she is on  meloxicam.  I would continue this with caution.  i have explained the risks to her and her husband and encouraged her to use the least effective dose  Problem # 3:  Hx of ISCHEMIC COLITIS (ICD-557.9) Assessment: Comment Only followed by Dr. Loreta Ave, but has not been to see her in a while.  I have encouraged them to follow up with Dr. Loreta Ave on multiple ocassions, but as far as I know, they have not yet done this  Problem # 4:  ? of BASAL CELL CARCINOMA, FACE (ICD-173.3) Assessment: Comment Only they were advised to follow up with her dermatologist.  I am not sure what the outcome was.  should ask about next visit  Problem # 5:  ANEMIA, IRON DEFICIENCY (ICD-280.9) last iron hgb normal Her updated medication list for this problem includes:    Ferrous Sulfate 325 (65 Fe) Mg Tabs (Ferrous sulfate) ..... 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  I would advise sending all ov notes and hospital records to each of her specialists, and encouraging them to send their notes to [facility]  Problem # 7:  PANIC ATTACKS (ICD-300.01) Assessment: Comment Only mangement per psych Her updated medication list for this problem includes:    Cymbalta 60 Mg Cpep (Duloxetine hcl) .Marland Kitchen... Per dr. Evelene Croon, take 1 tab po daily    Klonopin 1 Mg Tabs (Clonazepam) .Marland Kitchen... Per dr. Evelene Croon, take 1 tab by mouth once daily  Problem # 8:  OSTEOPENIA (ICD-733.90) Assessment: Comment Only managment per ortho Her updated medication list for this problem includes:    Boniva 150 Mg Tabs (Ibandronate sodium) .Marland Kitchen... Per dr. Ethelene Hal, take 1 tab monthly    Citracal/vitamin D 250-200 Mg-unit Tabs (Calcium citrate-vitamin d) ..... One tab by  mouth two times a day    Vitamin D3 2000 Unit Caps (Cholecalciferol) ..... One tab po qhs  Problem # 9:  IRRITABLE BOWEL SYNDROME (ICD-564.1) Assessment: Comment Only management per dr Loreta Ave  Problem # 10:  HYPOTHYROIDISM, UNSPECIFIED (ICD-244.9) Assessment: Comment Only  this is one of the only problems we manage Her updated medication list for this problem includes:    Synthroid 112 Mcg Tabs (Levothyroxine sodium) .Marland Kitchen... 1 tab by mouth daily for thyroid  Labs Reviewed: TSH: 2.787 (06/03/2009)    Chol: 175 (04/15/2009)   HDL: 77 (04/15/2009)   LDL: 65 (04/15/2009)   TG: 164 (04/15/2009)  Problem # 11:  DEPRESSION, MAJOR, RECURRENT (ICD-296.30) Assessment: Comment Only per psych.  on cymbalta, klonopin, seroquel, rozarem  Problem # 12:  MIGRAINE,  UNSPEC., W/O INTRACTABLE MIGRAINE (ICD-346.90) Assessment: Comment Only per neuro Her updated medication list for this problem includes:    Oxycontin 20 Mg Xr12h-tab (Oxycodone hcl) ..... One tab by mouth bid    Frova 2.5 Mg Tabs (Frovatriptan succinate) .Marland Kitchen... Per dr. Conrad [place], take as needed for migraine    Oxyir 5 Mg Caps (Oxycodone hcl) .Marland Kitchen... Per dr. Ethelene Hal, takke 1 tab q 4 hours prn    Aspirin Ec Low Dose 81 Mg Tbec (Aspirin) ..... One tab by mouth qd    Meloxicam 7.5 Mg Tabs (Meloxicam) .Marland Kitchen... 1 tab by mouth daily as needed joint pain  Complete Medication List: 1)  Cymbalta 60 Mg Cpep (Duloxetine hcl) .... Per dr. Evelene Croon, take 1 tab po daily 2)  Klonopin 1 Mg Tabs (Clonazepam) .... Per dr. Evelene Croon, take 1 tab by mouth once daily 3)  Seroquel 50 Mg Tabs (Quetiapine fumarate) .Marland Kitchen.. 1 by mouth at bedtime for sleep 4)  Rozerem 8 Mg Tabs (Ramelteon) .... Per dr Evelene Croon, take 1 tab at bedtime 5)  Tizanidine Hcl 4 Mg Tabs (Tizanidine hcl) .... Per dr. Conrad [place], 1/2 tab qam, 1/2 tab at supper 6)  Lyrica 100 Mg Caps (Pregabalin) .... Per dr. Celedonio Miyamoto, 1 tab by mouth tid 7)  Synthroid 112 Mcg Tabs (Levothyroxine sodium) .Marland Kitchen.. 1 tab by mouth daily for  thyroid 8)  Pantoprazole Sodium 40 Mg Tbec (Pantoprazole sodium) .... Take 1 tab daily 9)  Oxycontin 20 Mg Xr12h-tab (Oxycodone hcl) .... One tab by mouth bid 10)  Boniva 150 Mg Tabs (Ibandronate sodium) .... Per dr. Ethelene Hal, take 1 tab monthly 11)  Frova 2.5 Mg Tabs (Frovatriptan succinate) .... Per dr. Conrad [place], take as needed for migraine 12)  Ondansetron Hcl 4 Mg Tabs (Ondansetron hcl) .... Per dr. Loreta Ave take as needed for migraine 13)  Flonase 50 Mcg/act Susp (Fluticasone propionate) .... 2 squirts per nostril daily 14)  Oxyir 5 Mg Caps (Oxycodone hcl) .... Per dr. Ethelene Hal, takke 1 tab q 4 hours prn 15)  Align Caps (Misc intestinal flora regulat) .... Per dr. Loreta Ave, take 1 daily 16)  Centrum Silver Tabs (Multiple vitamins-minerals) 17)  500 Mg Caps (omega-3 Fatty Acids)  18)  Ferrous Sulfate 325 (65 Fe) Mg Tabs (Ferrous sulfate) .... One tab by mouth daily 19)  Citracal/vitamin D 250-200 Mg-unit Tabs (Calcium citrate-vitamin d) .... One tab by mouth two times a day 20)  Vitamin D3 2000 Unit Caps (Cholecalciferol) .... One tab po qhs 21)  Co Q-10 300 Mg Caps (Coenzyme q10) .... One tab by mouth qd 22)  Aspirin Ec Low Dose 81 Mg Tbec (Aspirin) .... One tab by mouth qd 23)  Naftin 1 % Crea (Naftifine hcl) .... Apply to rash on feet daily until healed 24)  Meloxicam 7.5 Mg Tabs (Meloxicam) .Marland Kitchen.. 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 Matter changes, focal dilatation L vert. Artery (05/2004) Anxiety/Depression  psychiatrist--Dr Evelene Croon  GASTROINTESTINAL ISSUES Hx of mult abd surgeries, + hx of adhesions.  Ischemic Colitis dx by colonoscopy Dr. 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 Sees Dr. Violeta Gelinas Right ankle fx(12/2003)  Right spiral tibia fracture (06/2006) fracture 2009 Sees Dr Ethelene Hal (PM&R physician with ortho)  GENERAL  MED/ENDOCRINE ISSUES Hypothryroidism Obesity ARF from Rhabo required HD x 2 --> resolved with normal renal US (09/2004) ; again from hypotension after unintentional overdose Hospitalized for hypotension 9/05  hospitalized with unintentional overdose X 3 2009-2010  LAST ECHO 7/10:  normal left ventricular systolic function, EF of 60- 65%, moderate diastolic dysfunction, mild pulmonary hypertension, and mild right ventricular dilatation.  Past Surgical History: Reviewed history from 12/26/2006 and no changes required. Hysterectomy & BSO-h/o abn paps (09/1968) Cholecystectomy (09/1969) Breast Augmentation Vagotomy Antrectomy with Biliroth I reconstruction for severe PUD (06/2000)  Abd. Surg. (10/2000)  Laparotomy w/ ventral hernia repair following SBO byDr. Abbey Chatters (01/2003) Laproscopic ventral hernia repair by Dr. Abbey Chatters (09/2003)  EMG-carpal tunnel left (06/2004)  HD catheter placed then removed (09/2004)  Vertebroplasty (01/2005) Spinal Laminectomy by Dr. Otelia Sergeant (04/2005) Transvaginal tape with suburethral sling/cytoscopy/A & P colporrhaphy by Dr. Edward Jolly (06/2006) ORIF right tibia Dr. Simonne Come (05/2006)   Family History: Reviewed history from 01/14/2009 and no changes required. Father-cancer  Mother-aneurysm, MI in 10s, COPD son--healthy  Social History: Reviewed history from 01/14/2009 and no changes required. Lives w/ husband, has one son; denies tobacco or EtOH; used to work as a Sports administrator, Runner, broadcasting/film/video (college).      Problem # 13:  OSTEOARTHRITIS, GENERALIZED (ICD-715.00) Assessment Comment Only on meloxicam (prescribed by [facility]).  as stated above, would use with caution because of past ARF.  pt and husband aware of risks Her updated medication list for  this problem includes:    Oxycontin 20 Mg Xr12h-tab (Oxycodone hcl) ..... One tab by mouth bid    Oxyir 5 Mg Caps (Oxycodone hcl) .Marland Kitchen... Per dr. Ethelene Hal, takke 1 tab q 4 hours prn    Aspirin Ec Low Dose 81 Mg Tbec (Aspirin) ..... One tab by mouth qd    Meloxicam 7.5 Mg Tabs (Meloxicam) .Marland Kitchen... 1 tab by mouth daily as needed joint pain

## 2010-10-06 NOTE — Progress Notes (Signed)
Summary: headache  Phone Note Call from Patient Call back at Home Phone [phone]   Caller: Spouse Summary of Call: husband reports Cloa is having a severe migrane headache that her medications are not helping despite taking all options she has available.  since this is significantly more severe than previous headaches i've advised an ER visit.  furthermore they can give IV/IM medications to try to relieve headache.  husband wasn't thrilled with possibility of ER visit.  I advised him that free standing ER in [place] would likely be her quickest ER visit so he could consider going there.   Initial call taken by: Ancil Boozer  MD,  March 01, 2010 10:55 PM     Appended Document: headache spouse called & wanted her seen today. no appt at this time of day. told him if she was still bad, go to ED closest to them. he had rejected that idea yesterday due to the wait in ED. told him we have am appt. best to call early for best choices

## 2010-10-06 NOTE — Assessment & Plan Note (Signed)
Summary: f/up,tcb   Vital Signs:  Patient profile:   65 year old female Weight:      256.8 pounds Temp:     98.2 degrees F oral Pulse rate:   92 / minute Pulse rhythm:   regular BP sitting:   121 / 90  (left arm) Cuff size:   large  Vitals Entered By: Loralee Pacas CMA (May 11, 2010 3:48 PM)  Primary Care Provider:  Asher Muir MD   History of Present Illness: 65 yo with multiple medical problems here to meet new MD.  right knee pain: per patient known osteoarthritis treated by orthopedist with knee injections.  has appointment in th enext month to discuss with him further.  At last visit was told she was not at the point needing surgery.  Had been taking mobic which at one point helped but feels this is no longer helping.  Also takes multiple narcotics with history of unintentional narcotic overdose.  prescribed oxyIR by Dr. Ethelene Hal for back pain, Oxycontin by neurologist, as well as cymbalta, lyrica.  LE edema:  continued bilateral LE edema.  patient states she continues to take fureosemide 20 mg daily although no longer on her medication list.    GI:  wants refill on pantoprazole.  Per patient has hx of gastric ulcer.  Has a GI.  Due for colonoscopy per patient.    Current Medications (verified): 1)  Cymbalta 60 Mg  Cpep (Duloxetine Hcl) .... Per Dr. Evelene Croon, Take 1 Tab Po Daily 2)  Klonopin 1 Mg  Tabs (Clonazepam) .... Per Dr. Evelene Croon, Take 1 Tab By Mouth Twice Daily 3)  Seroquel 50 Mg Tabs (Quetiapine Fumarate) .Marland Kitchen.. 1 By Mouth At Bedtime For Sleep 4)  Rozerem 8 Mg  Tabs (Ramelteon) .... Per Dr Evelene Croon, Take 1 Tab At Bedtime 5)  Tizanidine Hcl 4 Mg  Tabs (Tizanidine Hcl) .... Per Dr. Conrad [place], 1/2 Tab Qam, 1/2 Tab At Supper 6)  Lyrica 100 Mg Caps (Pregabalin) .... Per Dr. Celedonio Miyamoto, 1 Tab By Mouth Tid 7)  Synthroid 112 Mcg Tabs (Levothyroxine Sodium) .Marland Kitchen.. 1 Tab By Mouth Daily For Thyroid 8)  Pantoprazole Sodium 40 Mg  Tbec (Pantoprazole Sodium) .... Take 1 Tab Daily 9)   Oxycontin 20 Mg Xr12h-Tab (Oxycodone Hcl) .... Per Dr. Conrad [place] One Tab By Mouth Qid 10)  Frova 2.5 Mg  Tabs (Frovatriptan Succinate) .... Per Dr. Conrad [place], Take As Needed For Migraine 11)  Ondansetron Hcl 4 Mg  Tabs (Ondansetron Hcl) .... Per Dr. Loreta Ave Take As Needed For Migraine 12)  Flonase 50 Mcg/act  Susp (Fluticasone Propionate) .... 2 Squirts Per Nostril Daily 13)  Oxyir 5 Mg  Caps (Oxycodone Hcl) .... Per Dr. Ethelene Hal, [place] 1 Tab Q 4 Hours Prn 14)  Align   Caps (Misc Intestinal Flora Regulat) .... Per Dr. Loreta Ave, Take 1 Daily 15)  Centrum Silver   Tabs (Multiple Vitamins-Minerals) 16)  500 Mg  Caps (Omega-3 Fatty Acids) 17)  Ferrous Sulfate 325 (65 Fe) Mg Tabs (Ferrous Sulfate) .... One Tab By Mouth Daily 18)  Citracal/vitamin D 250-200 Mg-Unit Tabs (Calcium Citrate-Vitamin D) .... One Tab By Mouth Two Times A Day 19)  Vitamin D3 2000 Unit Caps (Cholecalciferol) .... One Tab Po Qhs 20)  Co Q-10 300 Mg Caps (Coenzyme Q10) .... One Tab By Mouth Qd 21)  Aspirin Ec Low Dose 81 Mg Tbec (Aspirin) .... One Tab By Mouth Qd 22)  Tylenol 8 Hour 650 Mg Cr-Tabs (Acetaminophen) .... Q 6 Hours As Needed.  Max 6 Per Day. 23)  Sam-E 200 Mg Tabs (S-Adenosylmethionine) .... Per Patient 24)  Furosemide 20 Mg Tabs (Furosemide) .... Take One Tablet Daily  Allergies: 1)  ! Wellbutrin 2)  * Azithromycin 3)  Erythromycin PMH-FH-SH reviewed-no changes except otherwise noted  Review of Systems      See HPI  Physical Exam  General:  Poor memory.  Well groomed.  not fully engaged- husband does more talking. Lungs:  Normal respiratory effort, chest expands symmetrically. Lungs are clear to auscultation, no crackles or wheezes. Heart:  Normal rate and regular rhythm. S1 and S2 normal without gallop, murmur, click, rub or other extra sounds. Msk:  normal ROM.  pain in right knee without effusion. 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  Encouraged discontinuation of meloxicam.  Agreeable to starting tylenol arthritis.  Pt to follow-up with ortho to discuss  knee pain.  The following medications were removed from the medication list:    Meloxicam 7.5 Mg Tabs (Meloxicam) .Marland Kitchen... 1 tab by mouth daily as needed joint pain Her updated medication list for this problem includes:    Oxycontin 20 Mg Xr12h-tab (Oxycodone hcl) .Marland Kitchen... Per dr. Conrad [place] one tab by mouth qid    Oxyir 5 Mg Caps (Oxycodone hcl) .Marland Kitchen... Per dr. Ethelene Hal, takke 1 tab q 4 hours prn    Aspirin Ec Low Dose 81 Mg Tbec (Aspirin) ..... One tab by mouth qd    Tylenol 8 Hour 650 Mg Cr-tabs (Acetaminophen) ..... Q 6 hours as needed.  max 6 per day.  Orders: Comp Met-[facility] ([address]) [facility]- Est  Level 4 ([address]11)  Problem # 2:  HYPOTHYROIDISM, UNSPECIFIED (ICD-244.9) Will recheck TSH today.  Her updated medication list for this problem includes:    Synthroid 112 Mcg Tabs (Levothyroxine sodium) .Marland Kitchen... 1 tab by mouth daily for thyroid  Orders: TSH[facility] ([address]78-[address]562) [facility]- Est  Level 4 ([address]86)  Problem # 3:  Sx of LEG EDEMA, BILATERAL (ICD-782.3) has been on chronic fureosemide.  Unclear why it was discontinued.  Patient very reluctant to discontinue use.  They have agreed that if i refill, will check BMEt every few months, and will attempt to try to use only as needed.  no significant edema today.  Per chart, looks as they have discussed compression hose before and was reluctant.  Will readdress as I get to know patient better  Her updated medication list for this problem includes:    Furosemide 20 Mg Tabs (Furosemide) .Marland Kitchen... Take one tablet daily  Problem # 4:  Preventive Health Care (ICD-V70.0) due for mammogram.  patient planning on following up with GI for colonoscopy.  pap not indicated.    Problem # 5:  ANEMIA, IRON DEFICIENCY (ICD-280.9) will recheck CBC  today.. On iron supplements.  Her updated medication list for this problem includes:    Ferrous Sulfate 325 (65 Fe) Mg Tabs (Ferrous sulfate) ..... One tab by mouth daily  Orders: Comp Met[facility] ([address]-96295) CBC-[facility] (28413) [facility]- Est  Level 4 (24401)  Complete Medication List: 1)  Cymbalta 60 Mg Cpep (Duloxetine hcl) .... Per dr. Evelene Croon, take 1 tab po daily 2)  Klonopin 1 Mg Tabs (Clonazepam) .... Per dr. Evelene Croon, take 1 tab by mouth twice daily 3)  Seroquel 50 Mg Tabs (Quetiapine fumarate) .Marland Kitchen.. 1 by mouth at bedtime for sleep 4)  Rozerem  8 Mg Tabs (Ramelteon) .... Per dr Evelene Croon, take 1 tab at bedtime 5)  Tizanidine Hcl 4 Mg Tabs (Tizanidine hcl) .... Per dr. Conrad [place], 1/2 tab qam, 1/2 tab at supper 6)  Lyrica 100 Mg Caps (Pregabalin) .... Per dr. Celedonio Miyamoto, 1 tab by mouth tid 7)  Synthroid 112 Mcg Tabs (Levothyroxine sodium) .Marland Kitchen.. 1 tab by mouth daily for thyroid 8)  Pantoprazole Sodium 40 Mg Tbec (Pantoprazole sodium) .... Take 1 tab daily 9)  Oxycontin 20 Mg Xr12h-tab (Oxycodone hcl) .... Per dr. Conrad [place] one tab by mouth qid 10)  Frova 2.5 Mg Tabs (Frovatriptan succinate) .... Per dr. Conrad [place], take as needed for migraine 11)  Ondansetron Hcl 4 Mg Tabs (Ondansetron hcl) .... Per dr. Loreta Ave take as needed for migraine 12)  Flonase 50 Mcg/act Susp (Fluticasone propionate) .... 2 squirts per nostril daily 13)  Oxyir 5 Mg Caps (Oxycodone hcl) .... Per dr. Ethelene Hal, takke 1 tab q 4 hours prn 14)  Align Caps (Misc intestinal flora regulat) .... Per dr. Loreta Ave, take 1 daily 15)  Centrum Silver Tabs (Multiple vitamins-minerals) 16)  500 Mg Caps (omega-3 Fatty Acids)  17)  Ferrous Sulfate 325 (65 Fe) Mg Tabs (Ferrous sulfate) .... One tab by mouth daily 18)  Citracal/vitamin D 250-200 Mg-unit Tabs (Calcium citrate-vitamin d) .... One tab by mouth two times a day 19)  Vitamin D3 2000 Unit Caps (Cholecalciferol) .... One tab po qhs 20)  Co Q-10 300 Mg Caps (Coenzyme q10) .... One tab by mouth qd 21)   Aspirin Ec Low Dose 81 Mg Tbec (Aspirin) .... One tab by mouth qd 22)  Tylenol 8 Hour 650 Mg Cr-tabs (Acetaminophen) .... Q 6 hours as needed.  max 6 per day. 23)  Sam-e 200 Mg Tabs (S-adenosylmethionine) .... Per patient 24)  Furosemide 20 Mg Tabs (Furosemide) .... Take one tablet daily  Patient Instructions: 1)  Will call you if labs are abnormal, will send a letter otherwise. 2)  Make  mammogram appt.   3)  Stop Meloxicam.  Instead, take Tylenol 650 mg as directed 4)  I sent refills for synthroid, pantoprazole, and furosemide to your pharmacy.  You may want to wait on picking up synthroid to make sure your dose stays the same. 5)  Follow-up in 2-3 months Prescriptions: FLONASE 50 MCG/ACT  SUSP (FLUTICASONE PROPIONATE) 2 squirts per nostril daily  #3 x 3   Entered and Authorized by:   Delbert Harness MD   Signed by:   Delbert Harness MD on 05/11/2010   Method used:   Electronically to        [facility] [id number] (retail)       [address]       [place], [place]  [address]       Ph: [phone] or [phone]       Fax: [phone]   RxID:   [id number] PANTOPRAZOLE SODIUM 40 MG  TBEC (PANTOPRAZOLE SODIUM) take 1 tab daily  #90 x 3   Entered and Authorized by:   Delbert Harness MD   Signed by:   Delbert Harness MD on 05/11/2010   Method used:   Electronically to        [facility] [id number] (retail)       [address]       [place], [place]  [address]       Ph: [phone] or [phone]  Fax: [phone]   RxID:   [id number] SYNTHROID 112 MCG TABS (LEVOTHYROXINE SODIUM) 1 tab by mouth daily for thyroid  #90 x 3   Entered and Authorized by:   Delbert Harness MD   Signed by:   Delbert Harness MD on 05/11/2010   Method used:   Electronically to        [id number] ([facility]       [address]       [place], [place]  [address]       Ph: [phone] or [phone]       Fax: [phone]   RxID:    [id number] FUROSEMIDE 20 MG TABS (FUROSEMIDE) take one tablet daily  #90 x 0   Entered and Authorized by:   Delbert Harness MD   Signed by:   Delbert Harness MD on 05/11/2010   Method used:   Electronically to        [id number] ([facility]       [address]       [place], [place]  [address]       Ph: [phone] or [phone]       Fax: [phone]   RxID:   [id number]

## 2010-10-06 NOTE — Progress Notes (Signed)
Summary: Rx Req  Phone Note Refill Request Call back at Home Phone [phone] Message from:  Patient  Refills Requested: Medication #1:  SYNTHROID 112 MCG TABS 1 tab by mouth daily for thyroid 90 DAY SUPPLY [facility] [address]  Initial call taken by: Clydell Hakim,  April 22, 2010 9:25 AM  Follow-up for Phone Call        I refilled it on 8/16.  Please let patient know she must make appt before additional refills given. Follow-up by: Delbert Harness MD,  April 22, 2010 11:17 AM  Additional Follow-up for Phone Call Additional follow up Details #1::        Notified pt and pt has f/up on 05/11/10. Additional Follow-up by: Clydell Hakim,  April 22, 2010 11:39 AM

## 2010-10-06 NOTE — Letter (Signed)
Summary: Results Follow-up Letter  [facility]  [address]   [place], [place] [address]   Phone: [phone]  Fax: [phone]    05/12/2010  [address] [place], [place]  [address]  Dear Ms. Munyon,   The following are the results of your recent bloodwork:  Your thyroid test and electrolytes, liver function, and kidney function look  normal.  You are no longer anemic- you hemoglobin is actually high.  You may discontinue your iron supplement at this time.  We will recheck your hemoglobin in a few months.  Sincerely,  Delbert Harness MD [facility]           Appended Document: Results Follow-up Letter mailed.

## 2010-10-06 NOTE — Assessment & Plan Note (Signed)
Summary: left foot wound/bmc   Vital Signs:  Patient profile:   65 year old female Height:      61.25 inches Weight:      253.1 pounds BMI:     47.60 Temp:     98.4 degrees F oral Pulse rate:   98 / minute BP sitting:   124 / 83  (left arm) Cuff size:   large  Vitals Entered By: Jimmy Footman, CMA (August 23, 2010 2:45 PM) CC: blister on left foot not healing well Is Patient Diabetic? No Pain Assessment Patient in pain? yes     Location: left foot Intensity: 5 Type: sharp   Primary Care Provider:  Delbert Harness MD  CC:  blister on left foot not healing well.  History of Present Illness: 65 yo here for evaluation of left foot blister  1.  left foot blister:  was evaluated in hospital.  Was a flaccid bullaeon left lateral plantar surface that was thought to be due to pressure as patient was admitted to [facility] due to drug overdose.  HAs continued to dress it with poor wound healing.  minimal drainage, no incrasing redness or fever.  Minimal pain.  2.  needs letter sent to ophtalmologist for cataract surgery.  3.  At the end of the visit patient's husband asks about getting a "stair lift" which they describe as a machine that would attach to stairs to help transport her from one floor to the other.  She says she is not able to ambulate due to right knee pain which she says she has had evaluated by an orthopedist who recommends weight loss at this time.  She says it limits her ability to leave the house.  She is unable to tell me how inability going up the stairs impacts her mobility outside the home.  Habits & Providers  Alcohol-Tobacco-Diet     Tobacco Status: never  Current Medications (verified): 1)  Cymbalta 60 Mg  Cpep (Duloxetine Hcl) .... Per Dr. Evelene Croon, Take 1 Tab Po Daily 2)  Klonopin 1 Mg  Tabs (Clonazepam) .... Per Dr. Evelene Croon, Take 1 Tab By Mouth Twice Daily 3)  Seroquel 50 Mg Tabs (Quetiapine Fumarate) .Marland Kitchen.. 1 By Mouth At Bedtime For Sleep 4)  Rozerem 8 Mg  Tabs  (Ramelteon) .... Per Dr Evelene Croon, Take 1 Tab At Bedtime 5)  Tizanidine Hcl 4 Mg  Tabs (Tizanidine Hcl) .... Per Dr. Conrad [place], 1/2 Tab Qam, 1/2 Tab At Supper 6)  Lyrica 100 Mg Caps (Pregabalin) .... Per Dr. Celedonio Miyamoto, 1 Tab By Mouth Tid 7)  Synthroid 112 Mcg Tabs (Levothyroxine Sodium) .Marland Kitchen.. 1 Tab By Mouth Daily For Thyroid 8)  Pantoprazole Sodium 40 Mg  Tbec (Pantoprazole Sodium) .... Take 1 Tab Daily 9)  Oxycontin 20 Mg Xr12h-Tab (Oxycodone Hcl) .... Per Dr. Conrad [place] One Tab By Mouth Qid 10)  Frova 2.5 Mg  Tabs (Frovatriptan Succinate) .... Per Dr. Conrad [place], Take As Needed For Migraine 11)  Ondansetron Hcl 4 Mg  Tabs (Ondansetron Hcl) .... Per Dr. Loreta Ave Take As Needed For Migraine 12)  Flonase 50 Mcg/act  Susp (Fluticasone Propionate) .... 2 Squirts Per Nostril Daily 13)  Oxyir 5 Mg  Caps (Oxycodone Hcl) .... Per Dr. Ethelene Hal, [place] 1 Tab Q 4 Hours Prn 14)  Align   Caps (Misc Intestinal Flora Regulat) .... Per Dr. Loreta Ave, Take 1 Daily 15)  Centrum Silver   Tabs (Multiple Vitamins-Minerals) 16)  500 Mg  Caps (Omega-3 Fatty Acids) 17)  Ferrous Sulfate 325 (65 Fe)  Mg Tabs (Ferrous Sulfate) .... One Tab By Mouth Daily 18)  Citracal/vitamin D 250-200 Mg-Unit Tabs (Calcium Citrate-Vitamin D) .... One Tab By Mouth Two Times A Day 19)  Vitamin D3 2000 Unit Caps (Cholecalciferol) .... One Tab Po Qhs 20)  Co Q-10 300 Mg Caps (Coenzyme Q10) .... One Tab By Mouth Qd 21)  Aspirin Ec Low Dose 81 Mg Tbec (Aspirin) .... One Tab By Mouth Qd 22)  Tylenol 8 Hour 650 Mg Cr-Tabs (Acetaminophen) .... Q 6 Hours As Needed.  Max 6 Per Day. 23)  Sam-E 200 Mg Tabs (S-Adenosylmethionine) .... Per Patient 24)  Furosemide 20 Mg Tabs (Furosemide) .... Take One Tablet Daily 25)  Zostavax [address] Unt/0.23ml Solr (Zoster Vaccine Live) .... Administer One Vaccine  Allergies: 1)  ! Wellbutrin 2)  * Azithromycin 3)  Erythromycin PMH-FH-SH reviewed for relevance  Review of Systems      See HPI  Physical Exam  General:   Well-developed,well-nourished,in no acute distress; alert,appropriate and cooperative throughout examination. She is here with her husband.  Uses a rolling walker for ambulation. Extremities:  left lateral plantar surface of foot has 2 x 6.5 cm superficial wound with thickened skin in area of healing bullae.  No abscess, 2+ DP, PT pulses.  No cellultis. Neurologic:  skin surrounding wound has normal sensation to monfilament testing.   Impression & Recommendations:  Problem # 1:  WOUND, FOOT (ICD-892.0)  etiology possibly pressure ulcer from [facility] from overdose.  Slow healing, neurovascularly intact.  No evidence of cellulitis.  Placed hydrocolloid dressing today.  Advised to return in one week for recheck to ensure progress in healing.  May benefit from additional week of dressing.  Luretha Murphy and Dr. Jennette Kettle also saw this wound if additional assistance is needed with resident who will follow-up as I will not be available next week.  Orders: [facility]- Est  Level 4 ([address]454)  Problem # 2:  CATARACT, LEFT EYE (ICD-366.9)  Sent letter to patient's ophthalmologist at patient's request as she is plannign on having cataract surgery on Dec 27th.  No recommended changes.  Orders: [facility]- Est  Level 4 (09811)  Problem # 3:  ABNORMALITY OF GAIT (ICD-781.2)  At end of office visit, patient asks about lift chair.  Did not have oportunity to fully evaluate patient.  Based on her complaints, it seems as major limitation is knee osetoarthritis for which patient is seeing an orthopedist for with no plans for surgery.  She is aware that weight plays a large role in her immobilty.  She is not open to physical therapy at this time.  She is on narcotics from other specialists and has been admitted with unintentional overdoses from poor memory of unclear etiology.  We discussed that goal is to help her become more mobile and that I did not recommend assistive lift devices.  Will send to geri clinic to fully assess gait and  mobility needs.  Orders: [facility]- Est  Level 4 ([address]214)  Complete Medication List: 1)  Cymbalta 60 Mg Cpep (Duloxetine hcl) .... Per dr. Evelene Croon, take 1 tab po daily 2)  Klonopin 1 Mg Tabs (Clonazepam) .... Per dr. Evelene Croon, take 1 tab by mouth twice daily 3)  Seroquel 50 Mg Tabs (Quetiapine fumarate) .Marland Kitchen.. 1 by mouth at bedtime for sleep 4)  Rozerem 8 Mg Tabs (Ramelteon) .... Per dr Evelene Croon, take 1 tab at bedtime 5)  Tizanidine Hcl 4 Mg Tabs (Tizanidine hcl) .... Per dr. Conrad [place], 1/2 tab qam, 1/2 tab at supper 6)  Lyrica 100 Mg Caps (Pregabalin) .... Per dr. Celedonio Miyamoto, 1 tab by mouth tid 7)  Synthroid 112 Mcg Tabs (Levothyroxine sodium) .Marland Kitchen.. 1 tab by mouth daily for thyroid 8)  Pantoprazole Sodium 40 Mg Tbec (Pantoprazole sodium) .... Take 1 tab daily 9)  Oxycontin 20 Mg Xr12h-tab (Oxycodone hcl) .... Per dr. Conrad [place] one tab by mouth qid 10)  Frova 2.5 Mg Tabs (Frovatriptan succinate) .... Per dr. Conrad [place], take as needed for migraine 11)  Ondansetron Hcl 4 Mg Tabs (Ondansetron hcl) .... Per dr. Loreta Ave take as needed for migraine 12)  Flonase 50 Mcg/act Susp (Fluticasone propionate) .... 2 squirts per nostril daily 13)  Oxyir 5 Mg Caps (Oxycodone hcl) .... Per dr. Ethelene Hal, takke 1 tab q 4 hours prn 14)  Align Caps (Misc intestinal flora regulat) .... Per dr. Loreta Ave, take 1 daily 15)  Centrum Silver Tabs (Multiple vitamins-minerals) 16)  500 Mg Caps (omega-3 Fatty Acids)  17)  Ferrous Sulfate 325 (65 Fe) Mg Tabs (Ferrous sulfate) .... One tab by mouth daily 18)  Citracal/vitamin D 250-200 Mg-unit Tabs (Calcium citrate-vitamin d) .... One tab by mouth two times a day 19)  Vitamin D3 2000 Unit Caps (Cholecalciferol) .... One tab po qhs 20)  Co Q-10 300 Mg Caps (Coenzyme q10) .... One tab by mouth qd 21)  Aspirin Ec Low Dose 81 Mg Tbec (Aspirin) .... One tab by mouth qd 22)  Tylenol 8 Hour 650 Mg Cr-tabs (Acetaminophen) .... Q 6 hours as needed.  max 6 per day. 23)  Sam-e 200 Mg Tabs (S-adenosylmethionine)  .... Per patient 24)  Furosemide 20 Mg Tabs (Furosemide) .... Take one tablet daily 25)  Zostavax [address] Unt/0.21ml Solr (Zoster vaccine live) .... Administer one vaccine  Patient Instructions: 1)  Follow-up in 1 week  2)  Will send letter to Dr. Elmer Picker for yoru cataract surgery on Dec 27th   Orders Added: 1)  [facility]- Est  Level 4 [[address]]

## 2010-10-06 NOTE — Assessment & Plan Note (Signed)
Summary: 1 WK APPT WOUND CHECK/RH   Vital Signs:  Patient profile:   65 year old female Height:      61.25 inches Weight:      256 pounds BMI:     48.15 Temp:     97.8 degrees F oral Pulse rate:   111 / minute BP sitting:   127 / 81  (left arm) Cuff size:   regular  Vitals Entered By: Garen Grams LPN (August 31, 2010 1:53 PM) CC: recheck wound Is Patient Diabetic? No Pain Assessment Patient in pain? yes     Location: foot   Primary Provider:  Delbert Harness MD  CC:  recheck wound.  History of Present Illness: Here w/ husband  for wound check L foot lateral. Pt denies pain, redness, and  drainage from wound. Pt is able to ambulate with minimal dificulty.  Pt is wrapping wound. Hydrocolloid dressing came off after 5 days.   Preventive Screening-Counseling & Management  Alcohol-Tobacco     Smoking Status: never     Tobacco Counseling: not indicated; no tobacco use  Allergies: 1)  ! Wellbutrin 2)  * Azithromycin 3)  Erythromycin  Review of Systems       As per HPI   Foot/Ankle Exam  General:    Pt accompanied by husband. In no acute distress.   Gait:    Slow but no apparent pain.   Skin:    Well-healing wound on plantar surface and L side of L foot. No redness. Non-tender. Thickened border.   Inspection:    Inspection reveals no deformity, ecchymosis or swelling.   Palpation:    non-tender  Vascular:    dorsalis pedis and posterior tibial pulses 2+ and symmetric,    Impression & Recommendations:  Problem # 1:  WOUND, FOOT (ICD-892.0) Well healing wound. Redressed with hydrocolloid dressing.  Advised pt that she would not need to redress once dressing falls off.  Orders: [facility]- Est Level  3 ([address])  Complete Medication List: 1)  Cymbalta 60 Mg Cpep (Duloxetine hcl) .... Per dr. Evelene Croon, take 1 tab po daily 2)  Klonopin 1 Mg Tabs (Clonazepam) .... Per dr. Evelene Croon, take 1 tab by mouth twice daily 3)  Seroquel 50 Mg Tabs (Quetiapine fumarate) .Marland Kitchen.. 1 by  mouth at bedtime for sleep 4)  Rozerem 8 Mg Tabs (Ramelteon) .... Per dr Evelene Croon, take 1 tab at bedtime 5)  Tizanidine Hcl 4 Mg Tabs (Tizanidine hcl) .... Per dr. Conrad [place], 1/2 tab qam, 1/2 tab at supper 6)  Lyrica 100 Mg Caps (Pregabalin) .... Per dr. Celedonio Miyamoto, 1 tab by mouth tid 7)  Synthroid 112 Mcg Tabs (Levothyroxine sodium) .Marland Kitchen.. 1 tab by mouth daily for thyroid 8)  Pantoprazole Sodium 40 Mg Tbec (Pantoprazole sodium) .... Take 1 tab daily 9)  Oxycontin 20 Mg Xr12h-tab (Oxycodone hcl) .... Per dr. Conrad [place] one tab by mouth qid 10)  Frova 2.5 Mg Tabs (Frovatriptan succinate) .... Per dr. Conrad [place], take as needed for migraine 11)  Ondansetron Hcl 4 Mg Tabs (Ondansetron hcl) .... Per dr. Loreta Ave take as needed for migraine 12)  Flonase 50 Mcg/act Susp (Fluticasone propionate) .... 2 squirts per nostril daily 13)  Oxyir 5 Mg Caps (Oxycodone hcl) .... Per dr. Ethelene Hal, takke 1 tab q 4 hours prn 14)  Align Caps (Misc intestinal flora regulat) .... Per dr. Loreta Ave, take 1 daily 15)  Centrum Silver Tabs (Multiple vitamins-minerals) 16)  500 Mg Caps (omega-3 Fatty Acids)  17)  Ferrous Sulfate 325 (65 Fe)  Mg Tabs (Ferrous sulfate) .... One tab by mouth daily 18)  Citracal/vitamin D 250-200 Mg-unit Tabs (Calcium citrate-vitamin d) .... One tab by mouth two times a day 19)  Vitamin D3 2000 Unit Caps (Cholecalciferol) .... One tab po qhs 20)  Co Q-10 300 Mg Caps (Coenzyme q10) .... One tab by mouth qd 21)  Aspirin Ec Low Dose 81 Mg Tbec (Aspirin) .... One tab by mouth qd 22)  Tylenol 8 Hour 650 Mg Cr-tabs (Acetaminophen) .... Q 6 hours as needed.  max 6 per day. 23)  Sam-e 200 Mg Tabs (S-adenosylmethionine) .... Per patient 24)  Furosemide 20 Mg Tabs (Furosemide) .... Take one tablet daily 25)  Zostavax 72536 Unt/0.50ml Solr (Zoster vaccine live) .... Administer one vaccine   Orders Added: 1)  [facility]- Est Level  3 [[address]403]

## 2010-10-06 NOTE — Consult Note (Signed)
Summary: [facility]  [facility]   Imported By: Knox Royalty 09/29/2010 09:34:31  _____________________________________________________________________  External Attachment:    Type:   Image     Comment:   External Document

## 2010-10-06 NOTE — Assessment & Plan Note (Signed)
Summary: [name],[name]   Vital Signs:  Patient profile:   65 year old female Height:      61.25 inches Weight:      252.31 pounds BMI:     47.46 Temp:     98.6 degrees F oral Pulse rate:   108 / minute BP sitting:   132 / 92  (left arm) Cuff size:   large  Vitals Entered By: Terese Door (August 11, 2010 1:48 PM) CC: Hospital F/U Is Patient Diabetic? No Pain Assessment Patient in pain? no        Primary Care Hildred Pharo:  Delbert Harness MD  CC:  Hospital F/U.  History of Present Illness: 65 yo here seen today for hospital follow-up  Was hospitalized for AMS secondary to immodium overdose. Memory back at [facility].  MMSE done today.  Discussed advanced directives- has not discussed, does not have living will.  Habits & Providers  Alcohol-Tobacco-Diet     Tobacco Status: never  Current Medications (verified): 1)  Cymbalta 60 Mg  Cpep (Duloxetine Hcl) .... Per Dr. Evelene Croon, Take 1 Tab Po Daily 2)  Klonopin 1 Mg  Tabs (Clonazepam) .... Per Dr. Evelene Croon, Take 1 Tab By Mouth Twice Daily 3)  Seroquel 50 Mg Tabs (Quetiapine Fumarate) .Marland Kitchen.. 1 By Mouth At Bedtime For Sleep 4)  Rozerem 8 Mg  Tabs (Ramelteon) .... Per Dr Evelene Croon, Take 1 Tab At Bedtime 5)  Tizanidine Hcl 4 Mg  Tabs (Tizanidine Hcl) .... Per Dr. Conrad [place], 1/2 Tab Qam, 1/2 Tab At Supper 6)  Lyrica 100 Mg Caps (Pregabalin) .... Per Dr. Celedonio Miyamoto, 1 Tab By Mouth Tid 7)  Synthroid 112 Mcg Tabs (Levothyroxine Sodium) .Marland Kitchen.. 1 Tab By Mouth Daily For Thyroid 8)  Pantoprazole Sodium 40 Mg  Tbec (Pantoprazole Sodium) .... Take 1 Tab Daily 9)  Oxycontin 20 Mg Xr12h-Tab (Oxycodone Hcl) .... Per Dr. Conrad [place] One Tab By Mouth Qid 10)  Frova 2.5 Mg  Tabs (Frovatriptan Succinate) .... Per Dr. Conrad [place], Take As Needed For Migraine 11)  Ondansetron Hcl 4 Mg  Tabs (Ondansetron Hcl) .... Per Dr. Loreta Ave Take As Needed For Migraine 12)  Flonase 50 Mcg/act  Susp (Fluticasone Propionate) .... 2 Squirts Per Nostril Daily 13)  Oxyir 5 Mg  Caps (Oxycodone Hcl)  .... Per Dr. Ethelene Hal, [place] 1 Tab Q 4 Hours Prn 14)  Align   Caps (Misc Intestinal Flora Regulat) .... Per Dr. Loreta Ave, Take 1 Daily 15)  Centrum Silver   Tabs (Multiple Vitamins-Minerals) 16)  500 Mg  Caps (Omega-3 Fatty Acids) 17)  Ferrous Sulfate 325 (65 Fe) Mg Tabs (Ferrous Sulfate) .... One Tab By Mouth Daily 18)  Citracal/vitamin D 250-200 Mg-Unit Tabs (Calcium Citrate-Vitamin D) .... One Tab By Mouth Two Times A Day 19)  Vitamin D3 2000 Unit Caps (Cholecalciferol) .... One Tab Po Qhs 20)  Co Q-10 300 Mg Caps (Coenzyme Q10) .... One Tab By Mouth Qd 21)  Aspirin Ec Low Dose 81 Mg Tbec (Aspirin) .... One Tab By Mouth Qd 22)  Tylenol 8 Hour 650 Mg Cr-Tabs (Acetaminophen) .... Q 6 Hours As Needed.  Max 6 Per Day. 23)  Sam-E 200 Mg Tabs (S-Adenosylmethionine) .... Per Patient 24)  Furosemide 20 Mg Tabs (Furosemide) .... Take One Tablet Daily 25)  Zostavax 81191 Unt/0.101ml Solr (Zoster Vaccine Live) .... Administer One Vaccine  Allergies: 1)  ! 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 Matter changes, focal dilatation L vert. Artery (05/2004) Anxiety/Depression  psychiatrist--Dr Evelene Croon admission 03/2009-nonintentional narcotic overdose Admission 06/2009 for overdose of 100 tabs of immodium GASTROINTESTINAL ISSUES Hx of mult abd surgeries, + hx of adhesions.  Ischemic Colitis dx by colonoscopy Dr. 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 Sees Dr. Violeta Gelinas Right ankle fx(12/2003)  Right spiral tibia fracture (06/2006) fracture 2009 Sees Dr Ethelene Hal (PM&R physician with ortho)  GENERAL MED/ENDOCRINE  ISSUES Hypothryroidism Obesity ARF from Rhabo required HD x 2 --> resolved with normal renal US (09/2004) ; again from hypotension after unintentional overdose Hospitalized for hypotension 9/05  hospitalized with unintentional overdose X 3 2009-2010  LAST ECHO 7/10:  normal left ventricular systolic function, EF of 60- 65%, moderate diastolic dysfunction, mild pulmonary hypertension, and mild right ventricular dilatation. PMH-FH-SH reviewed for relevance  Review of Systems      See HPI  Physical Exam  General:  Well-developed,well-nourished,in no acute distress; alert,appropriate and cooperative throughout examination.  Alert and oreinted to person and place Lungs:  Normal respiratory effort, chest expands symmetrically. Lungs are clear to auscultation, no crackles or wheezes. Heart:  Normal rate and regular rhythm. S1 and S2 normal without gallop, murmur, click, rub or other extra sounds. Extremities:  no LE edema   Impression & Recommendations:  Problem # 1:  MEMORY LOSS (ICD-780.93)  ? etiology- per history seems to have started after seizure for serotonin syndrome and have been chronically progressive.  Discussed talking to her neurologist at [facility] about aricept and need to tap into further resources at [facility].  Discussed advanced directives- given packet to discuss with family and plan for care when worsens as patient's memory has been the root of multiple admissions  Orders: [facility]- Est Level  3 (16109)  Problem # 2:  Preventive Health Care (ICD-V70.0) given info on zostavax.  pneumovax today.  Complete Medication List: 1)  Cymbalta 60 Mg Cpep (Duloxetine hcl) .... Per dr. Evelene Croon, take 1 tab po daily 2)  Klonopin 1 Mg Tabs (Clonazepam) .... Per dr. Evelene Croon, take 1 tab by mouth twice daily 3)  Seroquel 50 Mg Tabs (Quetiapine fumarate) .Marland Kitchen.. 1 by mouth at bedtime for sleep 4)  Rozerem 8 Mg Tabs (Ramelteon) .... Per dr Evelene Croon, take 1 tab at bedtime 5)  Tizanidine Hcl 4  Mg Tabs (Tizanidine hcl) .... Per dr. Conrad [place], 1/2 tab qam, 1/2 tab at supper 6)  Lyrica 100 Mg Caps (Pregabalin) .... Per dr. Celedonio Miyamoto, 1 tab by mouth tid 7)  Synthroid 112 Mcg Tabs (Levothyroxine sodium) .Marland Kitchen.. 1 tab by mouth daily for thyroid 8)  Pantoprazole Sodium 40 Mg Tbec (Pantoprazole sodium) .... Take 1 tab daily 9)  Oxycontin 20 Mg Xr12h-tab (Oxycodone hcl) .... Per dr. Conrad [place] one tab by mouth qid 10)  Frova 2.5 Mg Tabs (Frovatriptan succinate) .... Per dr. Conrad [place], take as needed for migraine 11)  Ondansetron Hcl 4 Mg Tabs (Ondansetron hcl) .... Per dr. Loreta Ave take as needed for migraine 12)  Flonase 50 Mcg/act Susp (Fluticasone propionate) .... 2 squirts per nostril daily 13)  Oxyir 5 Mg Caps (Oxycodone hcl) .... Per dr. Ethelene Hal, tak[place] 1 tab q 4 hours prn 14)  Align Caps (Misc intestinal flora regulat) .... Per dr. Loreta Ave, take 1 daily 15)  Centrum Silver Tabs (Multiple vitamins-minerals)  16)  500 Mg Caps (omega-3 Fatty Acids)  17)  Ferrous Sulfate 325 (65 Fe) Mg Tabs (Ferrous sulfate) .... One tab by mouth daily 18)  Citracal/vitamin D 250-200 Mg-unit Tabs (Calcium citrate-vitamin d) .... One tab by mouth two times a day 19)  Vitamin D3 2000 Unit Caps (Cholecalciferol) .... One tab po qhs 20)  Co Q-10 300 Mg Caps (Coenzyme q10) .... One tab by mouth qd 21)  Aspirin Ec Low Dose 81 Mg Tbec (Aspirin) .... One tab by mouth qd 22)  Tylenol 8 Hour 650 Mg Cr-tabs (Acetaminophen) .... Q 6 hours as needed.  max 6 per day. 23)  Sam-e 200 Mg Tabs (S-adenosylmethionine) .... Per patient 24)  Furosemide 20 Mg Tabs (Furosemide) .... Take one tablet daily 25)  Zostavax 16109 Unt/0.53ml Solr (Zoster vaccine live) .... Administer one vaccine  Other Orders: Pneumococcal Vaccine (60454) Admin 1st Vaccine (09811)  Patient Instructions: 1)  talk to you neurologist about medicines such as aricept for your memory 2)  see info on shingles vaccine 3)  you got your pneumovax today 4)  see info on  advanced directives 5)  follow-up in 4-6 months Prescriptions: ZOSTAVAX 19400 UNT/0.65ML SOLR (ZOSTER VACCINE LIVE) administer one vaccine  #1 x 0   Entered and Authorized by:   Delbert Harness MD   Signed by:   Delbert Harness MD on 08/11/2010   Method used:   Print then Give to Patient   RxID:   [id number]    Orders Added: 1)  Pneumococcal Vaccine [78469] 2)  Admin 1st Vaccine [90471] 3)  [facility]- Est Level  3 [62952]   Immunizations Administered:  Pneumonia Vaccine:    Vaccine Type: Pneumovax (Medicare)    Site: right deltoid    Mfr: Merck    Dose: 0.5 ml    Route: IM    Given by: Rochele Pages RN    Exp. Date: 12/30/2011    Lot #: [id number]    VIS given: 08/09/09 version given August 11, 2010.   Immunizations Administered:  Pneumonia Vaccine:    Vaccine Type: Pneumovax (Medicare)    Site: right deltoid    Mfr: Merck    Dose: 0.5 ml    Route: IM    Given by: Rochele Pages RN    Exp. Date: 12/30/2011    Lot #: [id number]    VIS given: 08/09/09 version given August 11, 2010.   Prevention & Chronic Care Immunizations   Influenza vaccine: Fluvax MCR  (07/15/2010)   Influenza vaccine due: 06/09/2008    Tetanus booster: 02/03/2003: Done.   Tetanus booster due: 02/02/2013    Pneumococcal vaccine: Pneumovax (Medicare)  (08/11/2010)    H. zoster vaccine: Not documented  Colorectal Screening   Hemoccult: Done.  (09/04/2004)   Hemoccult due: 09/04/2005    Colonoscopy: 11/2006 Ischemic Colitis  (12/26/2006)   Colonoscopy due: 12/25/2016  Other Screening   Pap smear: Not documented   Pap smear action/deferral: Not indicated S/P hysterectomy  (07/22/2009)    Mammogram: BI-RADS CATEGORY 1:  Negative.^MM DIGITAL DIAGNOSTIC BILAT  (07/18/2010)   Mammogram due: 12/26/2007    DXA bone density scan: Done.  (02/02/2005)   DXA scan due: None    Smoking status: never  (08/11/2010)  Lipids   Total Cholesterol: 175  (04/15/2009)   LDL: 65  (04/15/2009)   LDL Direct: Not  documented   HDL: 77  (04/15/2009)   Triglycerides: 164  (04/15/2009)     Geriatric Assessment:  Mental Status Exam: (value/max  value)    Orientation to Time: 3/5    Orientation to Place: 5/5    Registration: 3/3    Attention/Calculation: 5/5    Recall: 3/3    Language-name 2 objects: 2/2    Language-repeat: 1/1    Language-follow 3-step command: 3/3    Language-read and follow direction: 1/1    Write a sentence: 1/1    Copy design: 1/1 MSE Total score: 28/30

## 2010-10-06 NOTE — Progress Notes (Signed)
Summary: Triage  Phone Note Call from Patient   Caller: Spouse-Glenn Call For: [phone] Summary of Call: Pt have a large blister on lf side of lf foot 2 1/2" in length.  C/o of being painful Initial call taken by: Abundio Miu,  August 02, 2010 3:42 PM  Follow-up for Phone Call        Pt first noticed it this am when she awoke.  Does not remember any trauma or burns.  Husband says it is very painful to the point where she is refusing to wear a shoe on that foot.  She is not a diabetic.  Advised that she should be seen since I could not determine the type of wound.  The earliest he could get her in tomorrow was late afternoon.  No white team open slots so gave her a SDA appt with Dr . Wallene Huh for 4:15.   Follow-up by: Dennison Nancy RN,  August 02, 2010 3:51 PM

## 2010-10-26 ENCOUNTER — Telehealth: Payer: Self-pay | Admitting: Family Medicine

## 2010-10-26 NOTE — Telephone Encounter (Signed)
Needs most recent labs faxed to [phone], we referred pt.

## 2010-10-26 NOTE — Telephone Encounter (Signed)
Faxed.Logan Bores, Roselyn Meier

## 2010-10-27 ENCOUNTER — Other Ambulatory Visit: Payer: Self-pay | Admitting: Family Medicine

## 2010-10-27 MED ORDER — FUROSEMIDE 20 MG PO TABS: 20.0000 mg | ORAL_TABLET | Freq: Every day | ORAL | Status: DC

## 2010-10-31 ENCOUNTER — Other Ambulatory Visit: Payer: Self-pay | Admitting: Family Medicine

## 2010-10-31 MED ORDER — FUROSEMIDE 20 MG PO TABS: 20.0000 mg | ORAL_TABLET | Freq: Every day | ORAL | Status: DC

## 2010-11-15 LAB — CBC
HCT: 41.8 % (ref 36.0–46.0)
HCT: 41.2 % (ref 36.0–46.0)
MCH: 29.2 pg (ref 26.0–34.0)
MCHC: 33.4 g/dL (ref 30.0–36.0)
Platelets: 151 10*3/uL (ref 150–400)
Platelets: 153 10*3/uL (ref 150–400)
RBC: 4.14 MIL/uL (ref 3.87–5.11)
RBC: 4.65 MIL/uL (ref 3.87–5.11)
RDW: 13.8 % (ref 11.5–15.5)
RDW: 14 % (ref 11.5–15.5)
RDW: 14.1 % (ref 11.5–15.5)
RDW: 14 % (ref 11.5–15.5)
WBC: 16.5 10*3/uL — ABNORMAL HIGH (ref 4.0–10.5)
WBC: 9.5 10*3/uL (ref 4.0–10.5)
WBC: 17.5 10*3/uL — ABNORMAL HIGH (ref 4.0–10.5)

## 2010-11-15 LAB — COMPREHENSIVE METABOLIC PANEL
ALT: 27 U/L (ref 0–35)
ALT: 29 U/L (ref 0–35)
AST: 56 U/L — ABNORMAL HIGH (ref 0–37)
Albumin: 2.6 g/dL — ABNORMAL LOW (ref 3.5–5.2)
Albumin: 2.7 g/dL — ABNORMAL LOW (ref 3.5–5.2)
Alkaline Phosphatase: 80 U/L (ref 39–117)
Alkaline Phosphatase: 77 U/L (ref 39–117)
Glucose, Bld: 125 mg/dL — ABNORMAL HIGH (ref 70–99)
Glucose, Bld: 154 mg/dL — ABNORMAL HIGH (ref 70–99)
Potassium: 3.1 mEq/L — ABNORMAL LOW (ref 3.5–5.1)
Potassium: 4.3 mEq/L (ref 3.5–5.1)
Sodium: 139 mEq/L (ref 135–145)
Sodium: 140 mEq/L (ref 135–145)
Total Protein: 6.1 g/dL (ref 6.0–8.3)
Total Protein: 6.2 g/dL (ref 6.0–8.3)

## 2010-11-15 LAB — BASIC METABOLIC PANEL
BUN: 6 mg/dL (ref 6–23)
CO2: 25 mEq/L (ref 19–32)
Calcium: 7.8 mg/dL — ABNORMAL LOW (ref 8.4–10.5)
Calcium: 8.1 mg/dL — ABNORMAL LOW (ref 8.4–10.5)
Creatinine, Ser: 0.76 mg/dL (ref 0.4–1.2)
GFR calc Af Amer: >60 mL/min (ref >60)
GFR calc non Af Amer: >60 mL/min (ref >60)
Glucose, Bld: 134 mg/dL — ABNORMAL HIGH (ref 70–99)

## 2010-11-15 LAB — URINALYSIS, ROUTINE W REFLEX MICROSCOPIC
Bilirubin Urine: NEGATIVE
Glucose, UA: NEGATIVE mg/dL
Protein, ur: NEGATIVE mg/dL

## 2010-11-15 LAB — URINE CULTURE
Colony Count: >=100000
Culture  Setup Time: 201112010123

## 2010-11-15 LAB — DIFFERENTIAL
Basophils Relative: 0 % (ref 0–1)
Eosinophils Absolute: 0 10*3/uL (ref 0.0–0.7)
Eosinophils Relative: 0 % (ref 0–5)
Monocytes Absolute: 1.3 10*3/uL — ABNORMAL HIGH (ref 0.1–1.0)
Monocytes Relative: 8 % (ref 3–12)

## 2010-11-15 LAB — VITAMIN B12: Vitamin B-12: 344 pg/mL (ref 211–911)

## 2010-11-15 LAB — CULTURE, BLOOD (ROUTINE X 2)
Culture  Setup Time: 201112010917
Culture: NO GROWTH

## 2010-11-15 LAB — RAPID URINE DRUG SCREEN, HOSP PERFORMED
Amphetamines: NOT DETECTED
Benzodiazepines: POSITIVE — AB
Cocaine: NOT DETECTED
Tetrahydrocannabinol: NOT DETECTED

## 2010-11-15 LAB — POCT CARDIAC MARKERS
Myoglobin, poc: 223 ng/mL (ref 12–200)
Troponin i, poc: <0.05 ng/mL (ref 0.00–0.09)

## 2010-11-15 LAB — URINE MICROSCOPIC-ADD ON

## 2010-11-15 LAB — ACETAMINOPHEN LEVEL: Acetaminophen (Tylenol), Serum: <10 ug/mL — ABNORMAL LOW (ref 10–30)

## 2010-11-15 LAB — ETHANOL: Alcohol, Ethyl (B): <5 mg/dL (ref 0–10)

## 2010-11-15 LAB — POTASSIUM: Potassium: 3.6 mEq/L (ref 3.5–5.1)

## 2010-11-15 LAB — MRSA PCR SCREENING

## 2010-12-07 LAB — BASIC METABOLIC PANEL
BUN: 8 mg/dL (ref 6–23)
Calcium: 8 mg/dL — ABNORMAL LOW (ref 8.4–10.5)
Creatinine, Ser: 0.89 mg/dL (ref 0.4–1.2)
GFR calc Af Amer: >60 mL/min (ref >60)

## 2010-12-08 LAB — GRAM STAIN

## 2010-12-08 LAB — BASIC METABOLIC PANEL
BUN: 15 mg/dL (ref 6–23)
BUN: 18 mg/dL (ref 6–23)
Calcium: 8.3 mg/dL — ABNORMAL LOW (ref 8.4–10.5)
Chloride: 101 mEq/L (ref 96–112)
GFR calc Af Amer: 27 mL/min — ABNORMAL LOW (ref >60)
GFR calc Af Amer: 34 mL/min — ABNORMAL LOW (ref >60)
GFR calc Af Amer: 55 mL/min — ABNORMAL LOW (ref >60)
GFR calc non Af Amer: 22 mL/min — ABNORMAL LOW (ref >60)
GFR calc non Af Amer: 28 mL/min — ABNORMAL LOW (ref >60)
Glucose, Bld: 153 mg/dL — ABNORMAL HIGH (ref 70–99)
Potassium: 3.9 mEq/L (ref 3.5–5.1)
Potassium: 5.2 mEq/L — ABNORMAL HIGH (ref 3.5–5.1)
Sodium: 134 mEq/L — ABNORMAL LOW (ref 135–145)

## 2010-12-08 LAB — URINALYSIS, ROUTINE W REFLEX MICROSCOPIC
Bilirubin Urine: NEGATIVE
Glucose, UA: NEGATIVE mg/dL
Ketones, ur: NEGATIVE mg/dL
Protein, ur: 30 mg/dL — AB
Urobilinogen, UA: 0.2 mg/dL (ref 0.0–1.0)

## 2010-12-08 LAB — SALICYLATE LEVEL: Salicylate Lvl: <4 mg/dL (ref 2.8–20.0)

## 2010-12-08 LAB — COMPREHENSIVE METABOLIC PANEL
ALT: 35 U/L (ref 0–35)
AST: 56 U/L — ABNORMAL HIGH (ref 0–37)
CO2: 21 mEq/L (ref 19–32)
Chloride: 98 mEq/L (ref 96–112)
Creatinine, Ser: 2.34 mg/dL — ABNORMAL HIGH (ref 0.4–1.2)
GFR calc Af Amer: 25 mL/min — ABNORMAL LOW (ref >60)
GFR calc non Af Amer: 21 mL/min — ABNORMAL LOW (ref >60)
Glucose, Bld: 185 mg/dL — ABNORMAL HIGH (ref 70–99)
Total Bilirubin: 0.5 mg/dL (ref 0.3–1.2)

## 2010-12-08 LAB — CBC
HCT: 35.4 % — ABNORMAL LOW (ref 36.0–46.0)
Hemoglobin: 12.6 g/dL (ref 12.0–15.0)
MCV: 75.3 fL — ABNORMAL LOW (ref 78.0–100.0)
Platelets: 177 10*3/uL (ref 150–400)
RBC: 5.26 MIL/uL — ABNORMAL HIGH (ref 3.87–5.11)
RBC: 4.67 MIL/uL (ref 3.87–5.11)
WBC: 19 10*3/uL — ABNORMAL HIGH (ref 4.0–10.5)
WBC: 13 10*3/uL — ABNORMAL HIGH (ref 4.0–10.5)

## 2010-12-08 LAB — DIFFERENTIAL
Basophils Absolute: 0 10*3/uL (ref 0.0–0.1)
Eosinophils Absolute: 0 10*3/uL (ref 0.0–0.7)
Eosinophils Relative: 0 % (ref 0–5)
Neutrophils Relative %: 91 % — ABNORMAL HIGH (ref 43–77)

## 2010-12-08 LAB — URINE MICROSCOPIC-ADD ON

## 2010-12-08 LAB — RAPID URINE DRUG SCREEN, HOSP PERFORMED
Barbiturates: NOT DETECTED
Benzodiazepines: POSITIVE — AB
Cocaine: NOT DETECTED
Opiates: POSITIVE — AB

## 2010-12-08 LAB — GLUCOSE, CAPILLARY: Glucose-Capillary: 178 mg/dL — ABNORMAL HIGH (ref 70–99)

## 2010-12-12 LAB — POCT I-STAT 3, ART BLOOD GAS (G3+)
O2 Saturation: 64 %
pCO2 arterial: 27.4 mmHg (ref 35.0–45.0)
pH, Arterial: 7.473 (ref 7.350–7.400)
pO2, Arterial: 30 mmHg (ref 80.0–100.0)

## 2010-12-12 LAB — CBC
HCT: 23.3 % — ABNORMAL LOW (ref 36.0–46.0)
HCT: 28.7 % (ref 36.0–46.0)
Hemoglobin: 7.2 g/dL — CL (ref 12.0–15.0)
Hemoglobin: 9 g/dL (ref 12.0–15.0)
MCHC: 30.3 g/dL (ref 30.0–36.0)
MCHC: 30.8 g/dL (ref 30.0–36.0)
MCV: 64 fL — ABNORMAL LOW (ref 78.0–100.0)
Platelets: 302 10*3/uL (ref 150–400)
RBC: 3.64 MIL/uL — ABNORMAL LOW (ref 3.87–5.11)
RDW: 20.9 % (ref 11.5–15.5)
WBC: 13.8 10*3/uL (ref 4.0–10.5)

## 2010-12-12 LAB — TYPE AND SCREEN
ABO/RH(D): B POS
Antibody Screen: NEGATIVE

## 2010-12-12 LAB — RENAL FUNCTION PANEL
Albumin: 3 g/dL (ref 3.5–5.2)
BUN: 3 mg/dL — ABNORMAL LOW (ref 6–23)
Calcium: 7.7 mg/dL (ref 8.4–10.5)
Chloride: 105 mEq/L (ref 96–112)
Glucose, Bld: 108 mg/dL — ABNORMAL HIGH (ref 70–99)
Glucose, Bld: 117 mg/dL (ref 70–99)
Phosphorus: 1.9 mg/dL (ref 2.3–4.6)
Potassium: 3.7 mEq/L (ref 3.5–5.1)
Sodium: 138 mEq/L (ref 135–145)

## 2010-12-12 LAB — HEPARIN LEVEL (UNFRACTIONATED)
Heparin Unfractionated: 0.39 IU/mL (ref 0.30–0.70)
Heparin Unfractionated: 0.57 IU/mL (ref 0.30–0.70)

## 2010-12-12 LAB — BLOOD GAS, ARTERIAL
Bicarbonate: 25.6 mEq/L (ref 20.0–24.0)
Patient temperature: 98.6
pCO2 arterial: 53.8 mmHg (ref 35.0–45.0)
pH, Arterial: 7.299 (ref 7.350–7.400)
pO2, Arterial: 89.7 mmHg (ref 80.0–100.0)

## 2010-12-12 LAB — COMPREHENSIVE METABOLIC PANEL
ALT: 40 U/L — ABNORMAL HIGH (ref 0–35)
ALT: 49 U/L (ref 0–35)
Albumin: 3.2 g/dL (ref 3.5–5.2)
Alkaline Phosphatase: 82 U/L (ref 39–117)
Alkaline Phosphatase: 86 U/L (ref 39–117)
BUN: 16 mg/dL (ref 6–23)
BUN: 3 mg/dL — ABNORMAL LOW (ref 6–23)
CO2: 27 mEq/L (ref 19–32)
Calcium: 7.7 mg/dL — ABNORMAL LOW (ref 8.4–10.5)
Calcium: 8.1 mg/dL (ref 8.4–10.5)
Chloride: 98 mEq/L (ref 96–112)
Creatinine, Ser: 0.84 mg/dL (ref 0.4–1.2)
GFR calc non Af Amer: >60 mL/min (ref >60)
Glucose, Bld: 123 mg/dL (ref 70–99)
Glucose, Bld: 211 mg/dL — ABNORMAL HIGH (ref 70–99)
Potassium: 4.4 mEq/L (ref 3.5–5.1)
Potassium: 5.2 mEq/L (ref 3.5–5.1)
Sodium: 134 mEq/L (ref 135–145)
Total Bilirubin: 0.5 mg/dL (ref 0.3–1.2)
Total Protein: 6.3 g/dL (ref 6.0–8.3)

## 2010-12-12 LAB — RAPID URINE DRUG SCREEN, HOSP PERFORMED
Amphetamines: NOT DETECTED
Barbiturates: NOT DETECTED
Tetrahydrocannabinol: NOT DETECTED

## 2010-12-12 LAB — FRACTIONAL EXCRET-NA(24HR UR +SERUM)
Creatinine, Ser: 1.02 mg/dL (ref 0.40–1.20)
Creatinine, Urine: 37.7 mg/dL
Sodium, Ur: <15 mEq/L
Sodium: 137 mEq/L (ref 135–145)

## 2010-12-12 LAB — CARDIAC PANEL(CRET KIN+CKTOT+MB+TROPI)
CK, MB: 61.6 ng/mL (ref 0.3–4.0)
Relative Index: 0.6 (ref 0.0–2.5)
Relative Index: 0.7 (ref 0.0–2.5)
Total CK: 9257 U/L (ref 7–177)
Troponin I: 0.02 ng/mL (ref 0.00–0.06)
Troponin I: 0.02 ng/mL (ref 0.00–0.06)

## 2010-12-12 LAB — PROTIME-INR: INR: 1.1 (ref 0.00–1.49)

## 2010-12-12 LAB — URINALYSIS, ROUTINE W REFLEX MICROSCOPIC
Bilirubin Urine: NEGATIVE
Nitrite: NEGATIVE
Specific Gravity, Urine: 1.012 (ref 1.005–1.030)
pH: 5.5 (ref 5.0–8.0)

## 2010-12-12 LAB — DIFFERENTIAL
Basophils Relative: 0 % (ref 0–1)
Eosinophils Absolute: 0 10*3/uL (ref 0.0–0.7)
Monocytes Absolute: 0.8 10*3/uL (ref 0.1–1.0)
Neutrophils Relative %: 80 % (ref 43–77)

## 2010-12-12 LAB — LIPID PANEL
Cholesterol: 162 mg/dL (ref 0–200)
LDL Cholesterol: 82 mg/dL (ref 0–99)
Total CHOL/HDL Ratio: 2.8 RATIO

## 2010-12-12 LAB — URINE MICROSCOPIC-ADD ON

## 2010-12-12 LAB — D-DIMER, QUANTITATIVE: D-Dimer, Quant: 1.02 ug/mL-FEU (ref 0.00–0.48)

## 2010-12-12 LAB — MAGNESIUM: Magnesium: 1.8 mg/dL (ref 1.5–2.5)

## 2010-12-12 LAB — GLUCOSE, CAPILLARY: Glucose-Capillary: 129 mg/dL (ref 70–99)

## 2010-12-12 LAB — POCT CARDIAC MARKERS: Troponin i, poc: <0.05 ng/mL (ref 0.00–0.09)

## 2010-12-12 LAB — SODIUM: Sodium: 138 mEq/L (ref 135–145)

## 2010-12-12 LAB — ETHANOL: Alcohol, Ethyl (B): <5 mg/dL (ref 0–10)

## 2010-12-12 LAB — CK: Total CK: 6342 U/L (ref 7–177)

## 2010-12-13 LAB — POCT I-STAT, CHEM 8
Calcium, Ion: 1.05 mmol/L — ABNORMAL LOW (ref 1.12–1.32)
Chloride: 104 mEq/L (ref 96–112)
Creatinine, Ser: 2.1 mg/dL — ABNORMAL HIGH (ref 0.4–1.2)
Glucose, Bld: 130 mg/dL — ABNORMAL HIGH (ref 70–99)
HCT: 29 % — ABNORMAL LOW (ref 36.0–46.0)
Hemoglobin: 9.9 g/dL — ABNORMAL LOW (ref 12.0–15.0)

## 2010-12-13 LAB — DIFFERENTIAL
Basophils Absolute: 0.1 10*3/uL (ref 0.0–0.1)
Basophils Relative: 1 % (ref 0–1)
Eosinophils Absolute: 0 10*3/uL (ref 0.0–0.7)
Eosinophils Relative: 0 % (ref 0–5)
Monocytes Absolute: 0.7 10*3/uL (ref 0.1–1.0)

## 2010-12-13 LAB — COMPREHENSIVE METABOLIC PANEL
ALT: 18 U/L (ref 0–35)
AST: 32 U/L (ref 0–37)
Albumin: 3.1 g/dL — ABNORMAL LOW (ref 3.5–5.2)
Alkaline Phosphatase: 94 U/L (ref 39–117)
BUN: 20 mg/dL (ref 6–23)
Chloride: 101 mEq/L (ref 96–112)
GFR calc Af Amer: 30 mL/min — ABNORMAL LOW (ref >60)
Potassium: 5 mEq/L (ref 3.5–5.1)
Sodium: 134 mEq/L — ABNORMAL LOW (ref 135–145)
Total Bilirubin: 0.6 mg/dL (ref 0.3–1.2)

## 2010-12-13 LAB — RETICULOCYTES: Retic Count, Absolute: 77 10*3/uL (ref 19.0–186.0)

## 2010-12-13 LAB — CBC
HCT: 26.7 % — ABNORMAL LOW (ref 36.0–46.0)
MCHC: 32 g/dL (ref 30.0–36.0)
MCV: 63.9 fL — ABNORMAL LOW (ref 78.0–100.0)
Platelets: 108 10*3/uL — ABNORMAL LOW (ref 150–400)
Platelets: 124 10*3/uL — ABNORMAL LOW (ref 150–400)
RDW: 20.6 % — ABNORMAL HIGH (ref 11.5–15.5)
WBC: 13.8 10*3/uL — ABNORMAL HIGH (ref 4.0–10.5)

## 2010-12-13 LAB — POCT I-STAT 3, ART BLOOD GAS (G3+)
Acid-base deficit: 2 mmol/L (ref 0.0–2.0)
Bicarbonate: 26.6 mEq/L — ABNORMAL HIGH (ref 20.0–24.0)
O2 Saturation: 93 %
O2 Saturation: 93 %
Patient temperature: 99.5
TCO2: 27 mmol/L (ref 0–100)
pCO2 arterial: 51.9 mmHg — ABNORMAL HIGH (ref 35.0–45.0)
pO2, Arterial: 77 mmHg — ABNORMAL LOW (ref 80.0–100.0)

## 2010-12-13 LAB — CK TOTAL AND CKMB (NOT AT ARMC): Total CK: 969 U/L — ABNORMAL HIGH (ref 7–177)

## 2010-12-13 LAB — BASIC METABOLIC PANEL
BUN: 10 mg/dL (ref 6–23)
BUN: 5 mg/dL — ABNORMAL LOW (ref 6–23)
CO2: 29 mEq/L (ref 19–32)
Calcium: 8.3 mg/dL — ABNORMAL LOW (ref 8.4–10.5)
Chloride: 100 mEq/L (ref 96–112)
Creatinine, Ser: 0.82 mg/dL (ref 0.4–1.2)
Creatinine, Ser: 1.08 mg/dL (ref 0.4–1.2)
GFR calc Af Amer: >60 mL/min (ref >60)
GFR calc non Af Amer: 51 mL/min — ABNORMAL LOW (ref >60)
Glucose, Bld: 116 mg/dL — ABNORMAL HIGH (ref 70–99)

## 2010-12-13 LAB — IRON AND TIBC
Iron: 36 ug/dL — ABNORMAL LOW (ref 42–135)
Saturation Ratios: 7 % — ABNORMAL LOW (ref 20–55)
TIBC: 482 ug/dL — ABNORMAL HIGH (ref 250–470)

## 2010-12-13 LAB — GLUCOSE, CAPILLARY

## 2010-12-13 LAB — FERRITIN: Ferritin: 7 ng/mL — ABNORMAL LOW (ref 10–291)

## 2010-12-13 LAB — CARDIAC PANEL(CRET KIN+CKTOT+MB+TROPI)
CK, MB: 10.4 ng/mL — ABNORMAL HIGH (ref 0.3–4.0)
Relative Index: 0.9 (ref 0.0–2.5)
Total CK: 1082 U/L — ABNORMAL HIGH (ref 7–177)
Troponin I: 0.01 ng/mL (ref 0.00–0.06)
Troponin I: 0.02 ng/mL (ref 0.00–0.06)

## 2010-12-13 LAB — CK: Total CK: 619 U/L — ABNORMAL HIGH (ref 7–177)

## 2010-12-13 LAB — URINALYSIS, ROUTINE W REFLEX MICROSCOPIC
Ketones, ur: NEGATIVE mg/dL
Nitrite: NEGATIVE
pH: 5 (ref 5.0–8.0)

## 2010-12-13 LAB — RAPID URINE DRUG SCREEN, HOSP PERFORMED
Cocaine: NOT DETECTED
Tetrahydrocannabinol: NOT DETECTED

## 2010-12-13 LAB — FOLATE: Folate: >20 ng/mL

## 2011-01-11 ENCOUNTER — Other Ambulatory Visit: Payer: Self-pay | Admitting: Family Medicine

## 2011-01-11 NOTE — Telephone Encounter (Signed)
Refill request

## 2011-01-17 NOTE — H&P (Signed)
[name], [name]                 ACCOUNT NO.:  [id number]   MEDICAL RECORD NO.:  [id number]          PATIENT TYPE:  EMS   LOCATION:  [facility]                         FACILITY:  [facility]   PHYSICIAN:  [name], M.D.    DATE OF BIRTH:  1945/09/07   DATE OF ADMISSION:  01/24/2009  DATE OF DISCHARGE:                              HISTORY & PHYSICAL   PRIMARY CARE [name]:  Mary Muir, MD, [name]   CHIEF COMPLAINT:  Altered mental status.   HISTORY OF PRESENTING ILLNESS:  The patient is well-known to me, on  multiple narcotic and psychoactive medications from multiple providers  who presents with altered mental status.  The patient's husband reports  that the patient slept for about 14 hours last evening.  He initially  tried to wake her thinking that she was just sleepy but she was  difficult to arouse.  When he was able to get her up, she was  disoriented, coughing, having trouble breathing.  She seemed shaky  and tremulous.  She spilled two glasses of water.  Husband was concerned  and called the EMS.  In the ER, she was found to have an O2 sat of 62%  which improved to the 90s with non-rebreather mask.  The patient  desaturated when she was switched to nasal cannula, so ER physician  eventually placed her on BiPAP.  Upon questioning, the patient adamantly  denies taking any extra narcotic medications or other sedating meds  yesterday or during the night.  The patient's husband states he does not  suspect that she took any extra meds but of note, he was not home  yesterday and he frequently does supervise her including her  medications.  She did take two over yesterday but this is not unusual  for her and does not explain her symptoms.  Only recent medication  changes are increase of Lyrica by her neurologist.  They added one 100  mg tablet in the middle of the day and recently I decreased her Lasix  due to low blood pressures.   PAST MEDICAL HISTORY:  1.  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  Of note, she was hospitalized for unintentional      narcotic overdose in 2005 and again in 2006.  2. Memory deficit.  Etiology not clear, question if it is a sequela      from a seizure.  3. Anxiety and depression.  4. History of multiple abdominal surgeries and history of adhesions.  5. Ischemic colitis diagnosed by colonoscopy by Dr. Loreta [name] in 2008.  6. Irritable bowel syndrome.  7. Orthopedic problems including of compression fracture at T12 in      2006 and severe spinal stenosis, sees Ortho for this.  8. History of right angle fracture in 2005, a right spiral tibia      fracture in 2007, and she had another recent fracture in 2009,      although does not remember if it was right or left ankle.   OTHER MEDICAL ISSUES:  1. Hypothyroidism, on Synthroid.  2. Obesity.  3. History of acute renal failure from rhabdo requiring hemodialysis      in 2006 and hospitalized for hypotension in 2005.   PAST SURGICAL HISTORY:  1. Hysterectomy and bilateral salpingo-oophorectomy in 1970.  2. Cholecystectomy in 1971.  3. Breast augmentation.  4. Vagotomy, antrectomy, Billroth I reconstruction for severe peptic      ulcer disease in 2001 and some other abdominal surgery in 2002, I      am not sure the nature of that.  5. A laparotomy with ventral hernia repair following a small bowel      obstruction by Dr. Abbey [name] in 2004, then a laparoscopic repair      of that ventral hernia again by Dr. Abbey [name] in 2005.  6. Hemodialysis catheter placement in 2006 for hemodialysis from      rhabdo.  7. Vertebroplasty in 2006.  8. Spinal laminectomy in 2006.  9. Transvaginal tape with suburethral sling and cystoscopy in 2007.  10.Open reduction and fixation of right tibial fracture by Dr.      Simonne [name] in 2007.   FAMILY HISTORY:  Cancer and an aneurysm.   SOCIAL HISTORY:  Lives with husband and one son.  Denies  tobacco,  alcohol, and nonprescription drug.   PHYSICAL EXAMINATION:  VITAL SIGNS:  Temperature 99.5, blood pressures  ranging from 96-121/59-65, pulse 83-90, respirations 12-18, O2 sats 62%  initially on room air, then 100% on non-rebreather, desaturated at 86%  on 6 L nasal cannula.  Non-rebreather placed back on and saturating 99%,  then placed on BiPAP.  GENERAL:  She is obese, alert, and on BiPAP.  HEAD:  She is normocephalic and atraumatic without obvious  abnormalities.  EYES:  Pupils equally round and reactive to light.  Extraocular  movements intact.  MOUTH:  Dry mucous membranes.  Oral mucosa and oropharynx without  lesions or exudate - some dental caries.  NECK:  No deformities or masses noted.  LUNGS:  Clear to auscultation bilaterally.  HEART:  Regular rate and rhythm.  No murmurs detected.  ABDOMEN:  Soft, obese, nontender, and normoactive bowel sounds.  No  guarding.  PULSES:  She has 2+ DP pulses.  EXTREMITIES:  She has 1+ nonpitting pretibial edema.  NEUROLOGIC:  She is when I saw alert and oriented x3.  Cranial nerves II  through XII are intact.  Strength is 5/5 and equal in all extremities.  Sensation intact to light touch.  Finger-to-nose is normal.  Rapid  alternating movements are normal.  The patient does demonstrate some  short-term memory loss.  It is no different than when I had seen her in  the office on previous visits but did not walk.  She is on BiPAP when I  saw her.  PSYCH:  She does have again some short-term memory loss as above but  this is not different when I had seen her in the past.  She is not  depressed appearing.  She normally interactive with good eye contact.   LABORATORY DATA:  ABGs:  PCO2 56.3, pAO2 80, bicarb 25.1, and O2 sats  93%.  Sodium 134, potassium 5, chloride 101, bicarb 26, BUN 20,  creatinine 1.99, glucose 130, and calcium 8.5.  Total protein 6.1,  albumin 3.1, and AST 32.  White blood cell count 13.8, hemoglobin is low  at  8, hematocrit 26.7, and platelets slightly low at 124.  Differential  showed 86% neutrophils and ANC 11.9.  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  Chest x-ray  showed mild cardiac enlargement with no acute cardiopulmonary process.  UA showed small bilirubin, but otherwise normal.   ASSESSMENT AND PLAN:  The patient is a 65 year old female who presents  with altered mental status and hypoxia.  1. Altered mental status.  Again, this is suspicious for narcotic      sedative overdose.  This patient does have a history of being      hospitalized for this before, although she does adamantly deny      taking any extra medicines.  I doubt that the recent change in her      Lyrica dose would contribute to this.  I do wonder the patient's      husband who normally supervises her medicines was gone all day      yesterday, I wonder if it was possible that she did take some extra      medicines and does not remember taking them.  He does not seem      suspicious for this however.  Another possibility is an occult      infection.  She had an elevated white cell count of 13.8 and recent      cough, but a normal chest x-ray.  No fevers.  Normal lung exam and      really no signs of any systemic illness.  Plan:  We will admit to Step-Down, stop her BiPAP, place her on non-  rebreather, go and check another ABG, we will then try to decrease her  oxygen requirement as tolerated.  Again to go ahead and check a UDS,  although I doubt that she is taking anything other than what is legally  prescribed to her.  We will check cardiac enzymes, check an EKG,  consider checking the control substance database, although I do not have  a high suspicion that she is getting prescription from other providers  than who she tells [place].  She and her husband always bring a complete  medication list to each  visit, I assume that they do that with all other  specialists and they are very open about all the narcotics and benzos  and other sedating medicines that she is receiving.  For now, we will  hold all her narcotics and sedative medicines, but given her high  dependence on these, we will likely have to add back in something albeit  at a lower dose.  In my opinion, a long-term problem is the chronic  prescribing of these medicines by different physicians and again, I  presume that each of them are aware of the medicines that other  physicians are prescribing because they bring the list in each visit  with who is prescribing them and what they are for.  It is noted  previously and it is interesting that other PCPs have warned her and her  husband about the risks of this polypharmacy.  She had been taking many  of these medicines for quite some time, so I am adjuvant we will have a  hard time convincing her to discontinue or decrease any of these  medicines.  1. Hypoxemia.  Again, not likely due to hypoventilation from her      narcotics or sedatives as above and plan outlined as above.  2. Opioid dependence.  Again, see discussion under #1.  I do not know  how much progress we will make with this issue this admission and I      am not prescribing any medications that are sedative to her, so I      cannot personally stop any of them on an outpatient basis.  3. Acute renal failure.  Creatinine was normal at 1.11 in 2008, last      time it was checked in the office and today she is 1.99.  I imagine      this is due to hypotension and reported decreased p.o. intake for 1      day.  Additionally, she takes Mobic which I do prescribe her.  We      will give her IV fluids, withhold narcotics and sedatives, we will      withhold any nephrotoxins, and check a BMET in the morning.      Hopefully, this will improve with hydration and withholding of      nephrotoxins.  4. Hypothyroidism.  We will  go ahead and check a TSH.  She is due for      this at this time.  Continue her Synthroid.  5. Major depression.  We will get a hold on her Cymbalta right now as      it can also contribute to orthostatic hypotension until she is more      stable.  6. Anemia.  The patient was found to be anemic at 8.0 today, last      hemoglobin from [facility] is from October 2008 at 10.9.  I will check      an anemia panel and we will heme check stools.  She does have a      gastroenterologist who I am not sure that she has seen in a while.      She has a history of having colonoscopy and being diagnosed with      ischemic colitis.  It looks to me that she has not seen Dr. Loreta [name]      since April 2008.  I note that at a recent visit I had encouraged      her to go back and follow up with Dr. Loreta [name] which I do not believe      that she has.  So, I do know that she needs a colonoscopy while she      is here, but I certainly think that she needs to go back to Dr.      Loreta [name] and followup once she is discharged.  In the meantime, we will      check an anemia panel and supplement      with iron if that is appropriate.  We also heme check her stools.  7. Prophylaxis.  Her platelets were slightly low too, so I am going to      hold off on heparin and just give her Protonix and do SCDs.      Mary Muir, MD  Electronically Signed      Arnette Norris. [place] [name], M.D.  Electronically Signed    SO/MEDQ  D:  01/24/2009  T:  01/25/2009  Job:  [id number]

## 2011-01-17 NOTE — Assessment & Plan Note (Signed)
[facility]                            CARDIOLOGY OFFICE NOTE   SAPPHIRE, TYGART                        MRN:          [id number]  DATE:08/12/2007                            DOB:          1946-05-14    Ms. Boschee returns today for further management of lower extremity edema.  Please see my note from July 18, 2007.   I cut her furosemide back to 20 mg a day for fear of her history of  orthostatic hypotension, syncope and falling.  She has had multiple  falls, according to her husband, and multiple breaks.   Her weight is the same as last visit.  Her swelling might be a little  bit better, she says.   A 2-D echocardiogram was obtained, which showed basically a normal study  except for mild right ventricular hypertrophy.  There was no evidence of  pulmonary hypertension.  There was some mild LVH.  There was normal left  ventricular systolic function.   Her exam today again shows a low blood pressure of 90/60.  Her heart  rate is 100.  Her swelling is still around 2+ at the most, maybe 1+, in the right  lower extremity.  Pulses were intact.   I have had a nice talk with Ms. Hamelin and her husband.  I have  recommended no change in her current medical program, including the  furosemide 20 mg once a day.  I think orthostatic hypotension and its  clinical consequences are a real problem.  The worse thing we could do  is have her fall.   We talked about using a recumbent bike for increasing strength and,  hopefully, mobilization of fluid in her legs.  We talked again about  orthostatic precautions, elevating her legs when she is sitting,  watching sodium.   I will see her back on a p.r.n. basis.     Thomas C. Daleen Squibb, MD, [facility]  Electronically Signed    TCW/MedQ  DD: 08/12/2007  DT: 08/13/2007  Job #: [id number]   cc:   Asher Muir, MD

## 2011-01-17 NOTE — Assessment & Plan Note (Signed)
[facility]                            CARDIOLOGY OFFICE NOTE   NNENNA, MEADOR                        MRN:          [id number]  DATE:07/18/2007                            DOB:          11-05-1945    I was asked by Dr. Lafonda Mosses to evaluate Geraldo Pitter with peripheral  edema.   Ms. Marcom is a pleasant, 65 year old, white female who comes with her  husband today. She is in a left lower extremity cast from a slip up in  the tub, not syncope, and is using a walker.   She has been evaluated in this office as early as 2003. She had a normal  2-D echo at that time with an EF of 55-65%, no valvular heart disease,  no regional wall motion abnormalities and no LVH. She had a mildly  dilated left atrium. She also had a stress Cardiolite which was negative  for ischemia.   She also had a history of recurrent syncope and severe orthostatic  hypotension with probable venous pooling and dysautonomia. This was  diagnosed by Dr. Sharrell Ku. At one time she was on Florinef. She no  longer takes this medicine.   Her husband gives most of the history and says she really has not been  plagued by dizziness or fainting spells nearly as much as she has in the  past. The fall in the tub was clearly a slip.   Recently she had lower extremity edema noted in her legs. She was  started on furosemide 40 mg a day by Dr. Lafonda Mosses. Her blood pressure  which normally runs about 110/70 now runs 90-95 here in the clinic  today.   PAST MEDICAL HISTORY:  In addition to the above, she suffers from  migraines. She is on Lyrica for this. She does not smoke, drink or use  recreational products. She drinks about 2 cups of coffee a day.   SOCIAL HISTORY:  Back surgery in 2007, hysterectomy in 1988,  cholecystectomy in 1989. Leg surgery in 2006 and 2007.   FAMILY HISTORY:  Negative for premature coronary disease.   CURRENT MEDICATIONS:  1. Cymbalta 60 mg p.o. daily.  2.  Clonazepam 1 mg 2-3 times a day.  3. Seroquel 100 mg at bedtime.  4. Rozerem 8 mg at bedtime.  5. Zanaflex 4 mg each morning, 1/2 tablet at supper and a 1/2 tablet      at bedtime.  6. Lyrica 200 mg twice a day.  7. Synthroid 100 mcg daily.  8. Furosemide 20 mg 2 of these each morning. She just started on that      this week.  9. Pantoprazole 40 mg a day.  10.OxyContin 40 mg 2 times a day.  11.Boniva 100 mg a month.   ALLERGIES:  Z-PAK, ERYTHROMYCIN, WELLBUTRIN.   She takes a whole host of multivitamins.   SOCIAL HISTORY:  She is married. She has one child. She is disabled from  back problems. 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   REVIEW OF SYSTEMS:  Negative other than the HPI. She does  have a history  of anxiety and depression.   PHYSICAL EXAMINATION:  VITAL SIGNS:  Her blood pressure was 90/58, her  pulse was 109 and sinus tach. Her weight is 245, 5 feet 4.  SKIN:  Pale.  HEENT:  Normocephalic, atraumatic. PERRLA. Extraocular movements intact.  Sclera clear. Facial symmetry is normal.  NECK:  Supple. Carotid upstrokes equal bilaterally without bruits. There  is no JVD. Thyroid is not enlarged, trachea is midline.  LUNGS:  Clear to auscultation.  HEART:  Reveals a soft S1, S2 without gallop.  ABDOMEN:  Soft with good bowel sounds, organomegaly could not be  assessed with her sitting in the chair. She could not get up on the exam  table.  EXTREMITIES:  Reveal 1 to 2+ edema on the right, Pulses were intact. Her  left leg is encased in a cast.  NEUROLOGIC:  Intact.   EKG shows sinus tach with left axis deviation, RSR prime in V1 and V2  which is old.   I am very concerned about her being on Lasix with her history of severe  orthostatic hypotension and syncopal events. She is already on a lot of  different medications. With her sinus tachycardia today, in her lower  blood pressure she is already showing signs of intravascular volume  depletion.   PLAN:  1. Decrease  Lasix to 20 mg a day.  2. Increased mobility once her cast is removed.  3. Elevated legs as much as possible.  4. Avoid sodium as much as possible.  5. Consider support hose above the knee.  6. 2-D echocardiogram to reassess right-sided function and pressures      as well as overall left ventricular function.   If the latter is normal, we will use Lasix sparingly. I would use Lasix  sparingly to avoid hypotension and syncope. Hopefully we can manage this  conservatively.     Thomas C. Daleen Squibb, MD, [facility]  Electronically Signed    TCW/MedQ  DD: 07/18/2007  DT: 07/19/2007  Job #: [id number]   cc:   Asher Muir, MD

## 2011-01-17 NOTE — Op Note (Signed)
NAMESHAMIRA, TOUTANT                 ACCOUNT NO.:  [id number]   MEDICAL RECORD NO.:  [id number]          PATIENT TYPE:  OIB   LOCATION:  0098                         FACILITY:  [facility]   PHYSICIAN:  Marlowe Kays, M.D.  DATE OF BIRTH:  June 13, 1946   DATE OF PROCEDURE:  01/31/2007  DATE OF DISCHARGE:                               OPERATIVE REPORT   PREOPERATIVE DIAGNOSIS:  Painful plate and screws status post open  reduction and internal fixation right tibial fracture.   POSTOPERATIVE DIAGNOSIS:  Painful plate and screws status post open  reduction and internal fixation right tibial fracture.   OPERATION:  Removal of plate and screws, right tibia.   SURGEON:  Marlowe Kays, M.D.   ASSISTANT:  Nurse   ANESTHESIA:  General.   JUSTIFICATION FOR PROCEDURE:  She had a long spiral fracture requiring  medial tibial plate with a flange over the medial malleolus on October  27.  Since then, her fracture has healed. The flange, which is  subcutaneous over the medial malleolar area, in particular is causing  her pain and she is here today for removal.  There has been no evidence  for infection.   PROCEDURE:  Prophylactic antibiotic, satisfactory general anesthesia,  time out performed, pneumatic tourniquet with the right leg Esmarch'd  out non-sterilely, and prepped from just past the knees to the toes with  DuraPrep and draped in a sterile field.  I went through the old surgical  incision protecting the saphenous vein.  The nine individual screws were  identified and removed. The plate was then easily removed.  Once again,  there was no evidence for any infection.  The wound was copiously  irrigated with sterile saline and soft tissues infiltrated with 0.5%  plain Marcaine.  The wound was closed with interrupted 2-0 Vicryl in the  subcutaneous tissue and a combination of interrupted and running 3-0  nylon in the skin and subcutaneous tissue, as well.  Betadine, Adaptic,  dry, sterile  dressing, and a short leg cast were applied.  The  tourniquet was released.  She tolerated the procedure well and was taken  to the recovery room in satisfactory condition with no known  complications.           ______________________________  Marlowe Kays, M.D.     JA/MEDQ  D:  01/31/2007  T:  01/31/2007  Job:  [id number]

## 2011-01-17 NOTE — Discharge Summary (Signed)
[name], [name]                 ACCOUNT NO.:  [id number]   MEDICAL RECORD NO.:  [id number]          PATIENT TYPE:  INP   LOCATION:  6740                         FACILITY:  [facility]   PHYSICIAN:  Leighton Roach McDiarmid, M.D.DATE OF BIRTH:  November 19, 1945   DATE OF ADMISSION:  01/24/2009  DATE OF DISCHARGE:  01/26/2009                               DISCHARGE SUMMARY   DISCHARGE DIAGNOSES:  1. Pneumonia.  2. Altered mental status.  3. Acute renal failure, resolved.  4. Chronic opioid dependence.  5. Hypothyroidism.  6. Depression.  7. Migraines.  8. Back pain.   DISCHARGE MEDICATIONS:  1. Cymbalta 60 mg p.o. daily.  2. Klonopin 1 mg p.o. b.i.d. to t.i.d. p.r.n. for anxiety.  3. Seroquel 100 mg p.o. nightly.  4. Rozerem 8 mg p.o. nightly.  5. Tizanidine 4 mg p.o. one tab in a.m., one-half tab at supper, one-      half tab nightly.  6. Lyrica 100 mg take 2 tabs in the morning, 2 tabs in the evening.      This is a decreased dose from admission.  7. Synthroid 100 mcg p.o. daily.  8. Protonix 40 mg p.o. daily.  9. OxyContin 40 mg take one-half tab p.o. b.i.d.  Note, this is a      decreased dose from admission.  10.Boniva 150 mg monthly.  11.Frova 2.5 mg as instructed by Dr. Conrad [place] for migraine.  12.Zofran 4 mg every 6 hours p.r.n. nausea.  13.Flonase 50 mcg 2 sprays in each nostril daily.  14.OxyIR 5 mg take 1 tablet q.4 h p.r.n. pain.  15.Align caps, take one p.o. daily.  16.Centrum Silver tabs take one p.o. daily.  17.Omega-3 fatty acids.  18.Iron 325 mg p.o. b.i.d. for anemia.  Note, this is a new      medication.  19.Avelox 400 mg p.o. daily x6 days.  Note, this is a new medication.  20.Vitamin D3 2000 mg daily.  21.Citracal 600 mg two capsules daily.  22.Co-enzyme daily.  The patient was told to stop taking Mobic.   PERTINENT LABORATORY DATA AND STUDIES:  VITAL SIGNS:  The patient's  temperature was 99.5 on admission.  Her oxygen saturation was 62% on  room air at  admission increasing to 100% on non-rebreather, then 86% on  6 L, and then the patient was placed on BiPAP, eventually able to come  off BiPAP.  Her ABG on admission was 7.258, pCO2 was 56.3, pAO2 of 80,  bicarb 25.1, O2 sats 93%.  Potassium on admission was 5.0, bicarb 26,  creatinine 1.99, which is considerably higher than her baseline of about  1.1, albumin 3.1.  White blood cell count 13.8, hemoglobin 8, platelets  124.  Differential showed 86%, neutrophils with an ANC of 11.9.  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  Chest x-ray showed mild cardiac  enlargement.  No acute cardiopulmonary process.  UA showed small bili,  but was otherwise normal.  A CT angio  was obtained on day 2 of  hospitalization.  I had concern for pulmonary embolism because the  patient's oxygen saturation levels kept dropping below 90% despite her  being awake and alert.  CT angio of the chest showed no pulmonary  embolism, but it did show findings consistent with a right upper lobe  pneumonia and some less patchy involvement in left upper lobe and  perihilar region.  CT sinus was also performed that was within normal  limits.  The patient's CK was elevated initially that trended downward  with fluids, it was on admission 969 reached a high of 1103, but then  was trending back down with the last measurement of 619.  Her creatinine  at the time of discharge was back actually a little bit lower baseline  at 0.82.  Her hemoglobin at the time of discharge was 7.9, which is  quite a bit lower than her baseline of 10.9, last measurements in 2008.   REASON FOR HOSPITALIZATION:  The patient is a 65 year old on multiple  narcotic and psychoactive medications provided by multiple different  providers who presented with altered mental status.  The patient's  husband reported that  she slept for about 67-14 hours and when he tried  to awaken her, she was very difficult for arouse.  When she did wake up,  she was disoriented, coughing, having trouble breathing.  She seemed  shaky, spill 2 glasses of water and seemed confused to him.  He called  EMS.  She was taken to the emergency room where she had an oxygen  saturation of 62% on room air.  Of note, the patient adamantly denies  taking any extra narcotics or sedating medications prior to admission;  however, she does have short-term memory loss in her husband who  frequently provides her with lots of supervision, was not at home the  day prior to admission.   HOSPITAL COURSE:  1. Altered mental status.  The scenario was suspicious for narcotic or      sedative overdose especially with her high CO2 and her multiple      narcotic medicines that she takes at baseline.  Unfortunately, no      Narcan was given to see if she would have responded for that, which      would help with diagnosis.  Of note, the patient was hospitalized      several years ago.  I believe it was in 2006 for similar episode      that was attributed to an accidental narcotic overdose.  As I said      previously, the patient adamantly denied taking any extra      medicines.  Her husband does not think she took any extra      medicines.  As mentioned under labs and studies, she did have a CT      angio, which showed a potential right upper lobe pneumonia and she      was having a mild cough, but was afebrile, although she did have a      mild leukocytosis, so perhaps the pneumonia was what caused altered      mental status with a combination of the pneumonia and the narcotic      medicines.  I still think it is possible that she did have extra      narcotics on board.  My concern is that she continues to take these      on array of narcotic and sedative medications  and that they are      prescribed by number of different prescribers.  I believe  that all      the prescribers are aware of the multitude of medications that she      takes as her and her husband come in with a complete medication      list with doses and who is prescribing those to all of her visits      with me, I assume they probably do that with her other doctors as      well.  In terms of her course, she was admitted to step down.  She      was transitioned from the BiPAP that she had been placed on the      emergency room to a non-rebreather and then to nasal cannula, which      she tolerated well; however on day 2 of hospitalization, she      desated on several occasions, it is down to the 80s when nasal      cannula was taken off.  For that reason, the CT angio was done to      rule out pulmonary embolism and the pneumonia was found.  So, our      thought is that perhaps the pneumonia was causing her continued      desaturations even when her altered mental status was resolved.  We      did send her home on a slightly reduced dose of her Lyrica, so that      had been the only medicine that had been recently increased.  We      sent her on a reduced dose of OxyContin and half of her previous      dose.  2. Acute renal failure.  The patient had admission creatinine of 1.99,      whereas her last in October 2008 had been 1.11 likely due to some      possible hypotension in the night reported decreased p.o. intake      the day prior.  Also her CK was elevated to up to high of 1100 and      then the patient was taking Mobic while she was probably little bit      volume depleted.  We gave her several boluses and continued on IV      fluids overnight.  By the next morning, her creatinine was back to      her baseline at the time of discharge, it is actually little bit      lower than baseline.  We also told her to discontinue her Mobic,      which I do not think is providing her a lot of pain relief as it is      and its continued use has probably given her increased  cardiac      risk.  3. Hypothyroidism.  We checked a TSH just because she was due for      this, although at the time of dictation it was not back.  4. Depression.  We held her Cymbalta since it can contribute to      orthostatic hypotension while she was in house, but we sent her      home back on her home dose.  5. Anemia.  The patient has some baseline anemia with her last      hemoglobin being 10.9 in October 2008, but when she entered the  hospital, her hemoglobin was significantly lower than baseline of      8.0.  Anemia panel was done that was consistent with iron      deficiency anemia with a retic count of 1.9%, absolute reticulocyte      77, iron 36, total iron binding capacity 482, percent saturation 7.      Vitamin B12 527, folate more than 20, ferritin 7.  I started her on      some iron 325 b.i.d. and will follow up with that on an outpatient      basis.   Followup issues include,attempts to facilitate communication amongst her  physicians.  I think there should be consideration by all of [place] to  decrease her sedative medications.  Clearly this patient has quite a bit  of pain and it will be difficult to convince her to decrease any of her  medicines.  It will also be difficult to convince her  husband to  decrease any of her medicines.  Also, follow up for her anemia, I will  check a CBC in a few months in the clinic and follow up a BMET for her  acute renal failure that resolved.  She has follow up visit scheduled  with me on Friday, June 4.      Asher Muir, MD  Electronically Signed      Leighton Roach McDiarmid, M.D.  Electronically Signed    SO/MEDQ  D:  01/28/2009  T:  01/29/2009  Job:  [id number]   cc:   Milagros Evener, M.D.  Izora Ribas, M.D.  Richard D. Ethelene Hal, M.D.

## 2011-01-17 NOTE — H&P (Signed)
[name], [name]                 ACCOUNT NO.:  [id number]   MEDICAL RECORD NO.:  [id number]          PATIENT TYPE:  INP   LOCATION:  3701                         FACILITY:  [facility]   PHYSICIAN:  Pearlean Brownie, M.D.DATE OF BIRTH:  Oct 22, 1945   DATE OF ADMISSION:  03/04/2009  DATE OF DISCHARGE:                              HISTORY & PHYSICAL   PRIMARY CARE Seena Face:  Asher Muir, M.D.   CHIEF COMPLAINT:  Altered mental status.   HISTORY OF PRESENT ILLNESS:  [name] with a history  of unintentional narcotic overdose, who presents to the [facility] with a one-day history of altered loss of consciousness.  Per the  husband, the patient went to the bathroom on the night of 03/03/2009,  around 11 p.m., and when the patient's husband woke up at 2:30 a.m., the  patient was found unresponsive in the bathroom on the toilet seat.  The  husband then tried to arouse the patient, with moderate success, with  the patient being able to get up and go lay down on the bed.  The  patient subsequently went back to sleep in the bed.  The following  morning the patient continued to be somnolent and the husband decided to  call the [facility] for an urgent visit.  Upon arrival to  the [facility], the patient was found to be desaturating at  83%, which improved to 95% after she was placed on 2l liters.  There was  also difficulty with obtaining a blood pressure, which upon use of a  Doppler, the blood pressure was found to be around 90 palpable at the  [facility].  The patient was subsequently sent to the  emergency room for evaluation. Upon arrival to the emergency department,  the patient was found to have a decreased respiratory effort with  respiratory rates of less than 10, and saturating less than 90% on 2  liters nasal cannula.  The patient is noted to be on extensive narcotic  medication for multiple medical problems.   The patient also had a recent  history of a narcotic overdose approximately one month ago.  Per the  husband, there have been no recent fevers, chills, chest pain or  shortness of breath or narcotic over-use, as the husband states that he  controls and gives the narcotic medication to the patient.  Upon arrival  to the emergency department, the patient was found to be UDS positive  for opiates as well as benzodiazepines.  The patient was then given  Narcan in the E.D., with a drastic improvement in her respiratory status  and improvement in her overall mental status.  Incidentally the patient  was found to have a positive D-dimer on admission as well.  The patient  was also found to be in acute renal failure, with a creatinine of 1.6  upon admission.   PAST MEDICAL HISTORY:  1. Seizures, secondary to serotonin syndrome in 2002, with narcotic      and benzodiazepine dependence.  2. Unintentional narcotic overdose in 2005, and in 2006.  3. History of memory deficit, of unclear etiology, now with a possible      question of seizure sequela.  4. Anxiety/depression.  5. Ischemic colitis.  6. Irritable bowel syndrome.  7. Orthopedic problems, including a T12 fracture in 2006, as well as      severe L3-L4 spinal stenosis.  8. Hypothyroidism.  9. History of acute renal failure, secondary to rhabdomyolysis,      requiring hemodialysis in 2006.   PAST SURGICAL HISTORY:  1. History of multiple abdominal surgeries, including a history of      adhesions.  2. History of hysterectomy and bilateral salpingo-oophorectomy.  3. Cholecystectomy in 1971.  4. Breast augmentation.  5. Billroth type I in 2001.  6. Vertebroplasty in 2006.  7. Spinal laminectomy in 2006.  8. Transvaginal tape and sling and cystoscopy in 2007.  9. Open reduction, fixation of a right tibial fracture.   SOCIAL HISTORY:  The patient lives with her husband and son at home.  The patient denies any tobacco, alcohol or drug  use.   FAMILY HISTORY:  Father with cancer.  Mother an aneurysm, myocardial  infarction in the 33's, as well as chronic obstructive pulmonary  disease.  The son is healthy.   ALLERGIES:  1. ERYTHROMYCIN, a rash.  2. AZITHROMYCIN, a rash.  3. WELLBUTRIN, seizures.   MEDICATIONS:  1. Cymbalta 60 mg p.o. daily.  2. Klonopin 1 mg p.o. b.i.d. to t.i.d.  3. Seroquel 100 mg p.o. q.h.s.  4. Rozerem 8 mg p.o. q.h.s.  5. Tizanidine 4 mg, two tab q.a.m. and 1/2 tab at supper/ 1/2 tab      q.h.s.  6. Synthroid 100 mcg, one tab p.o. daily.  7. Protonix 40 mg p.o. daily.  8. Lyrica 100 mg, two tab q.a.m. and two tab q.h.s  9. OxyContin 40 mg p.o. b.i.d.  10.Boniva 150 mg p.o. monthly.  11.__________ 2.5 mg p.r.n. for migraines.  12.Ondansetron 4 mg p.o. p.r.n. for migraines.  13.Flonase 50 mcg per actuation suspension, two squirts per nostril      daily  14.Oxy IR 5 mg p.o. q.4h. p.r.n.  15.Alli caps, one tab p.o. daily  16.Centrum Silver daily multivitamins.  17.Omega-3 fatty acids 500 mg, one cap daily.  18.Mobic 7.5 mg tab, one tab p.o. b.i.d.   REVIEW OF SYSTEMS:  Negative except as noted above in the HPI.   PHYSICAL EXAMINATION:  VITAL SIGNS:  Temperature 97.6 degrees, pulse 84,  respirations 12, blood pressure 111/70.  The pO2 is 97% on 2 liters.  Weight 266 pounds.  GENERAL:  Obese female, somnolent.  HEENT:  Normocephalic and atraumatic.  Dry oral mucosa.  NECK:  No deformities or masses noted.  CARDIOVASCULAR:  A regular rate and rhythm.  No rubs, gallops or murmurs  auscultated.  LUNGS:  Clear to auscultation bilaterally.  No wheezes, rhonchi or rales  noted.  ABDOMEN:  Obese abdomen noted.  A midline incision in place.  Positive  tenderness to palpation in the left upper quadrant/left 12th anterior  intercostal area/epigastric region.  BACK:  No abnormalities noted.  RECTAL:  Good sphincter tone with no occult blood in the stool.  EXTREMITIES: 2+ peripheral pulses, 2+  pitting edema in the distal  extremities bilaterally.  Positive tenderness to palpation in the left  calf.  Right calf circumference at 44 cm, left calf circumference at  47.5 cm.  NEUROLOGIC:  Cranial nerves II-XII  grossly intact.  MUSCULOSKELETAL:  With 2+ strength in the upper and lower  extremities  bilaterally.   LABORATORY DATA/STUDIES:  White count 13.8, hemoglobin 9, platelets 393.  D-dimer 1.02.  CBG 129.  CMP:  Sodium 134, potassium 5.2, chloride 98,  bicarb 26, BUN 16, creatinine 1.62, glucose 123.  T-bili 0.5, alkaline  phosphatase 86, AST 168, ALT 47, total protein 71, albumin 3.5, calcium  8.5.  Cardiac markers:  CK-MB of 56.6, troponin I less than 0.05.  Myoglobin 360.  Urinary drug screen positive for benzodiazepines and  opiates.  Urinalysis:  Yellow, hazy, specific gravity of 1.012, pH 5.5,  trace blood, no protein, no nitrites, small leukocytes, positive hyaline  casts, a few bacteria.   RADIOLOGICAL STUDIES:  Head CT without contrast:  1. No intracranial abnormalities.  2. Cerebral atrophy and small vessel ischemic changes.  3. 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   Chest x-ray:  1. No acute cardiopulmonary disease.  2. Cardiomegaly without congestive heart failure.  3. Small hiatal hernia.   IMPRESSION:  [name] with an extensive history of  narcotic dependence, as well as multiple unintentional narcotic  overdoses, who presents to [facility] with an altered  mental status and questionable pulmonary embolus.   PROBLEMS/PLAN:  1. Altered mental status:  This is likely secondary to the hypoxia      which may be due to a pulmonary embolus, versus opioid overdose;      however, given the patient's drastic improvement upon Narcan      administration, opioid overdose seems more likely.  We will hold      all sedating medications, pending a pharmacy consultation for      medical reconciliation.  In particular, we will be  holding the      Seroquel, Rozerem, Tizanidine, Lyrica, OxyContin, as well as the      Mobic.  2. Questionable pulmonary embolus:  In terms of the workup for the      pulmonary embolus, a V/Q scan as well as a lower extremity Doppler      are currently pending, to rule out pulmonary embolus, versus some      type of venous thrombus event, given the presentation of hypoxia      plus a positive D-dimer, as well as positive tenderness to      palpation and increased left calf size in the emergency department,      and the patient will be placed on treatment level heparin for deep      venous thrombosis/pulmonary embolus.  3. Acute renal failure:  This is likely pre-renal, given the patient's      decreased p.o. intake over the last two to three days, versus a      renal cause, secondary to prolonged hypoxia and hypoxemia.  So we      will hydrate with normal saline at 125 mL, as well as monitor the      creatinine and avoid any known nephrotoxins.  We will also hold the      Mobic.  4. Hyperkalemia:  The patient was found to have a mildly elevated      potassium; however, no electrocardiogram changes were noted, as      well as no other clinical signs of hyperkalemia.  We will hydrate      the patient and repeat potassium, to evaluate for possible      hemoconcentration effects causing falsely elevated potassium.  5. Elevated D-dimer:  This is concerning for a deep venous thrombosis,  versus pulmonary embolus.  We will check lower extremity Dopplers      as well as a V/Q scan.  The patient is on treatment dose of      heparin.  6. Anemia:  This is likely secondary to iron deficiency anemia.      Hemoccult proved negative.  As there is no obvious source for a GI      bleed, but the patient does report per the husband of a colonoscopy      in 2008, and the patient does have a history of peptic ulcer      disease, we will hold aspirin and Mobic and will start iron while      admitted.   The patient may require packed red blood cells, if the      hemoglobin drops below 8, and may need a further outpatient      evaluation for the cause of this anemia.  7. Leukocytosis:  There is no clear etiology of infection, as the      patient is afebrile and may be secondary to hemoconcentration.  We      will hold antibiotic treatment for now, as we follow serial white      counts, as we hydrate the patient.  We will continue to monitor.  8. Transaminitis:  This is new since the last admission.  There may be      some alcohol involvement; however, we will check an alcohol level.      It also may be secondary to prolonged hypoxia, given her prolonged      episodes of apnea/hypoxia before presentation to the primary care      center at the [facility] and the emergency department.  9. Fever/Electrolytes/Nutrition/GI:  The patient was placed on a heart-      healthy diet with the management of electrolytes, as above.  Start      normal saline at 125 mL an hour.  10.Prophylaxis:  The patient will be placed on heparin and Protonix.   DISPOSITION:  Is dependent upon clinical improvement.  The patient is a  full code.  Will follow up.      Doree Albee, MD  Electronically Signed      Pearlean Brownie, M.D.  Electronically Signed    SN/MEDQ  D:  03/05/2009  T:  03/05/2009  Job:  [id number]

## 2011-01-17 NOTE — Discharge Summary (Signed)
[name], [name]                 ACCOUNT NO.:  [id number]   MEDICAL RECORD NO.:  [id number]          PATIENT TYPE:  INP   LOCATION:  3701                         FACILITY:  [facility]   PHYSICIAN:  Nestor Ramp, MD        DATE OF BIRTH:  [birth year]   DATE OF ADMISSION:  03/04/2009  DATE OF DISCHARGE:  03/06/2009                               DISCHARGE SUMMARY   DISCHARGE DIAGNOSES:  1. Nonintentional narcotic overdose.  2. Altered mental status.  3. Seizure.  4. History of unintentional narcotic overdose in 2005 and 2006.  5. Anxiety/depression.  6. Irritable bowel syndrome.  7. Orthopedic problems including T12 fracture in 2006 with L3-L4      spinal stenosis.  8. Hypothyroidism.  9. Rhabdomyolysis.  10.Acute renal failure.  11.Chronic opiate dependent.  12.Iron-deficiency anemia.   DISCHARGE MEDICATIONS:  1. Iron sulfate 325 mg 1 p.o. b.i.d.  2. Cymbalta 60 mg p.o. daily.  3. Klonopin 1 mg p.o. b.i.d. decreased from t.i.d.  4. Rozerem 8 mg nightly.  5. Tizanidine 4 mg 1 tablet p.o. q.a.m., one-half tablet p.o. q.p.m.      and nightly.  6. Synthroid 100 mcg daily.  7. Lyrica 100 mg 1 tablet p.o. q.a.m., 2 tablets p.o. q.p.m.  This is      decreased from 2 tablets p.o. q.a.m.  8. Protonix 40 mg p.o. daily.  9. OxyContin 40 mg.  The patient is to take one-half tablet b.i.d.,      this is decreased from 40 mg b.i.d.  10.OxyIR 5 mg p.o. q.4 h. p.r.n. pain.  11.Boniva 150 mg p.o. monthly.  12.Frova 2.5 mg p.r.n. migraine.  13.Zofran 4 mg p.o. p.r.n. migraine q.6 h.  14.Flonase 50 mcg 2 squirts per nostril daily.  15.Multivitamin daily.  16.Calcium, vitamin D supplement daily.  17.Omeag-3 fatty acids supplement daily.  18.Probiotics supplement daily.  19.Mobic 7.5 mg p.o. b.i.d. p.r.n. pain.   DISCONTINUE MEDICATIONS:  None.   MEDICATION CHANGES PER ABOVE:  1. Klonopin decreased to b.i.d. dosing.  2. Lyrica a.m. dosing now 1 tablet.  3. Oxycodone dosing has been reduced  to 50%.   CONSULTATIONS:  None.   PROCEDURE:  None.   IMAGING:  1. CT of head, no acute intracranial abnormality.  2. Chest x-ray, no acute process, cardiomegaly without congestive      heart failure.  3. Lower extremity Dopplers, no evidence of DVT, superficial      thrombosis, or Baker cyst.  4. V/Q scan, single small mismatch perfusion defect within medial      right midlung, low probability for PE, decreased ventilation and      perfusion in left lung.  5. Echocardiogram, normal left ventricular systolic function, EF of 60-      65%, moderate diastolic dysfunction, mild pulmonary hypertension,      and mild right ventricular dilatation.   DISCHARGE LABORATORY DATA:  Troponins, negative x3.  D-dimer 1.02.  Magnesium 1.8, potassium 3.6, creatinine 0.84.  AST 93, decreased from  170; ALT 40, decreased from 49.  Hemoglobin 7.2, hematocrit 23.3, WBC  10.1, platelets 257, MCV 64.  Lipid profile, total cholesterol 162,  triglycerides 114, HDL 57, LDL 82.  BNP 148.  Hemoccult negative.   BRIEF HOSPITAL COURSE:  A 65 year old female with multiple admissions  for repetitive altered mental status, hypoxia, and loss of  consciousness.  Of note, the patient's primary care [name] has been  trying very diligently to have the patient decrease off of multiple  sedative medications including narcotics and benzodiazepines however has  been unsuccessful as the patient has many physicians who prescribes her  medications.  1. Altered mental status.  The patient was admitted with altered      mental status concern for unintentional narcotic overdose, which      has happened repeatedly over the past 3 years.  The patient was      given Narcan upon evaluation at emergency department and responded      very quickly to Narcan with increasing respiratory drive and level      of consciousness.  Other differentials for respiratory distress and      altered mental status included pulmonary process and  cardiac      process.  Imaging did not show any evidence of pulmonary embolism,      and there was no evidence of infectious process such as pneumonia      during exam.  The patient also remained afebrile throughout      admission.  There was no evidence of congestive heart failure,      although the patient did have lower extremity edema, which this was      explained to the patient and the patient's husband; therefore, no      further workup such as diuresis was needed.  The patient was      maintained on oxygen prior to discharge as O2 sats were dropped      into the mid 80s.  On day of discharge, the patient's oxygen was      weaned fairly quickly.  The patient was on room air prior to      discharge with O2 sat of 98%.  The patient was very anxious and      wanted to go home, as primary team felt that even though the      patient had been successfully weaned off oxygen that she was stable      for discharge.  As likely cause of altered mental status and      respiratory depression and unintentional narcotic overdose,      Pharmacology was consulted to speak with the patient and husband      for plan to start to discontinue and taper off of many sedative      medications.  At discharge, changes were made to the patient's      narcotic regimen.  The patient's OxyContin dose was decreased,      however, p.r.n. dose will continue the same as to not cause      significant withdrawal . The patient's benzodiazepines in the form      of Klonopin was also decreased to b.i.d. dosing and Lyrica was also      decreased prior to admission.  The patient and husband were in      agreement with these decreases and actually offered to decrease her      other medications after speaking with pharmacist.  For further      tapers, the patient should follow up with primary care [name] as  well as psychiatrist, Dr. Evelene Croon who prescribed these medications.  2. Microcytic anemia.  The patient with  microcytic anemia, was found      to be Hemoccult negative, was supposed to be on iron tablets prior      to admission; however, the patient was unable to obtain.  The      patient was maintained on iron supplementation.  As though the      patient's hemoglobin did drop to 7.2, however, was given very large      amounts of fluids secondary to rhabdomyolysis when the patient      initially presented with a CK of greater than 10,000.  As the      patient was not tachycardic and asymptomatic, decision was made      that the patient's hemoglobin will be stable and will be checked in      the office on Tuesday status post any fluids.  The patient will      continue p.o. supplementation of iron.  3. Acute renal insufficiency.  The patient was found to have acute      renal insufficiency upon admission.  This was likely due to      dehydration and possible rhabdomyolysis.  Her CK was elevated at      10,203.  With IV fluids, the patient's CK and acute renal      insufficiency resolved initially to perform FENa; however, the      patient's renal status returned to baseline fairly quickly prior to      urine studies obtained.  No further workup was needed.  4. Migraines.  The patient with history of chronic migraines, has been      seen by [facility].      Upon discharge, the patient had acute migraine which was likely due      to be either behavioral effects with history of depression and      anxiety as well as upset about decreasing the patient's pain      medications, although agreed to at time.  The patient was      maintained on 10 mg OxyContin while inpatient and given p.r.n.      dosing.  At the time of migraine after review of records from [facility], decision was made to give the      patient a dose of Frova 2.5 mg along with prednisone 40 mg.  The      patient also required a second dose of Frova prior to  discharge.      The patient will continue home regimen for migraines.  As this was      not a new problem and this was chronic, decision was made not to      keep the patient in-house for migraine headaches.  Also had      recently had CT scan of head on this admission which was negative      for any intracranial abnormality and there was no change in      neurological status at that time.  5. Rhabdomyolysis.  The patient with rhabdomyolysis likely secondary      to above respiratory depression, altered mental status.  The      patient was hydrated with IV fluids as per above and rhabdomyolysis      trended downward.  At discharge, the patient had a CK of 4068 and  primary team felt that this was safe to discontinue checking.  Of      note, the patient also had elevated transaminases and this was all      likely due to initial episode of altered mental status and loss of      consciousness with rhabdomyolysis.  The patient's enzymes were      trending downward at discharge; therefore, this can be followed as      an outpatient.  6. Hypothyroidism.  The patient was maintained on Synthroid.  7. Depression, anxiety.  The patient needs to follow up with      psychiatrist.  This is also playing a big role in the patient's      chronic illnesses and opiate dependent.  We will decrease certain      medications as per above.   DISCHARGE CONDITION:  Stable/improved.   DISCHARGE INSTRUCTIONS:  The patient is to follow up with [facility].  The patient will have hemoglobin check on  Tuesday, March 09, 2009.  The patient is to continue heart-healthy diet.   FOLLOWUP APPOINTMENTS:  1. [facility].  The patient will call for an      appointment and office will also contact the      patient to make sure labs are done.  2. The patient is to follow up with Dr. Evelene Croon, Psychiatry as scheduled.   DISCHARGE LOCATION:  Home.      Milinda Antis, MD   Electronically Signed      Nestor Ramp, MD  Electronically Signed    KD/MEDQ  D:  03/07/2009  T:  03/07/2009  Job:  [id number]   cc:   Asher Muir, MD  Milagros Evener, M.D.  Anselmo Rod, M.D.

## 2011-01-20 NOTE — H&P (Signed)
NAMEARIEON, SCALZO                 ACCOUNT NO.:  [id number]   MEDICAL RECORD NO.:  [id number]          PATIENT TYPE:  AMB   LOCATION:  SDC                           FACILITY:  [facility]   PHYSICIAN:  Randye Lobo, M.D.   DATE OF BIRTH:  January 02, 1946   DATE OF ADMISSION:  DATE OF DISCHARGE:                                HISTORY & PHYSICAL   CHIEF COMPLAINT:  Urinary incontinence and constipation.   HISTORY OF PRESENT ILLNESS:  The patient is a 65 year old para 1, Caucasian  female, status post total vaginal hysterectomy with bilateral salpingo-  oophorectomy, who presents with a complaint of urinary incontinence while  sleeping.  The patient has had a history of urinary leakage with physical  activity.  She also reports nocturia.  Urodynamic testing in the office  confirmed the presence of genuine stress incontinence with a leak point  pressure of 78 cmH2O.  The maximum bladder capacity was noted to be 356 mL.  The patient has not had any improvement of her symptoms with anticholinergic  therapy.  The patient would like surgical treatment for her urinary  incontinence.    The patient has a history of constipation which requires splinting to have  bowel movements.  The patient does have a history of irritable bowel  syndrome.   PAST OBSTETRIC AND GYNECOLOGIC HISTORY:  The patient is status post vaginal  delivery x1.  She is status post total vaginal hysterectomy with bilateral  salpingo-oophorectomy.  The patient's last mammogram was in February of 2007  and was within normal limits.   PAST MEDICAL HISTORY:  1. History of seizure disorder.  The patient has decreased short term      memory secondary to seizures.  2. Depression.  3. Insomnia.  4. Hyperthyroidism.  5. Irritable bowel syndrome.  6. Migraine headaches.  7. History of melanoma.  8. History of urinary tract infection, two to three times per year.   PAST SURGICAL HISTORY:  1. Status post total vaginal hysterectomy with  bilateral salpingo-      oophorectomy.  2. Status post back surgery.   MEDICATIONS:  1. Cymbalta 60 mg 1 by mouth daily.  2. Clonazepam 1 mg by mouth twice daily to three times daily.  3. Seroquel 200 mg by mouth at bedtime.  4. Rozerem 8 mg by mouth at bedtime.  5. Zanaflex 4 mg by mouth three times daily.  6. Lyrica 100 mg by mouth three times daily.  7. Synthroid 100 mcg 1 by mouth daily.  8. Omeprazole 50 by mouth daily.  9. Meloxicam 7.5 mg by mouth twice daily.  10.Boniva 150 mg by mouth each month.  11.Centrum Silver multivitamin 1 by mouth daily.  12.Omega 3 fish oil 1000 mg 2 by mouth daily.  13.Citracal 600 mg 2 by mouth daily.  14.Aspirin 81 mg 1 by mouth daily.  15.Co-Q-10 150 mg by mouth daily.  16.Frova 2.5 mg by mouth as needed.  17.Reglan 10 mg by mouth p.r.n.  18.Midrin p.r.n.  19.OxyContin 40 mg by mouth p.r.n.  20.Flonase 0.05% p.r.n.  21.Cholestyramine 4 mg  by mouth twice daily.  22.Oxy-IR 5 mg by mouth every four hours p.r.n.   ALLERGIES:  Z-PACK, ERYTHROMYCIN, WELLBUTRIN (CAUSES SEIZURES).   SOCIAL HISTORY:  The patient has been married for 40 years.  She has one  child.  She does not work outside of her home.  Denies the use of tobacco,  alcohol or illicit drugs.   FAMILY HISTORY:  Positive for stroke in the patient's mother and father,  positive for osteoporosis in the mother and grandmother, positive for an  aortic aneurysm in the patient's mother and positive for depression in the  patient's father.  There is no family history of breast, ovarian, uterine,  or colon cancer.   PHYSICAL EXAMINATION:  VITAL SIGNS:  Height 5 feet 4 inches, weight 232  pounds.  GENERAL:  The patient is a quiet-appearing female with facial dystonia.  LUNGS:  Clear to auscultation bilaterally.  HEART:  S1, S2 with a regular rate and rhythm.  ABDOMEN:  Obese, soft and nontender.  No evidence of hepatosplenomegaly or  organomegaly.  PELVIC:  Normal external genitalia and  urethra.  The cervix and uterus are  noted to be absent.  There is a minimal cystocele. There is a second degree  rectocele.  There is firm stool palpable in the rectum on bimanual vaginal  exam.  There are no adnexal masses appreciated.   IMPRESSION:  Patient is a 64 year old para 1 female status post total  vaginal hysterectomy with bilateral salpingo-oophorectomy who has  urodynamically proven genuine stress incontinence and a symptomatic  rectocele.   PLAN:  The plan will be for the patient to undergo a tension free vaginal  tape, suburethral sling with cystoscopy and posterior colporrhaphy at the  [facility] [place] on June 11, 2006.  Risks, benefits and  alternatives have been discussed with the patient and her husband, who  wished to proceed.      Randye Lobo, M.D.  Electronically Signed     BES/MEDQ  D:  06/11/2006  T:  06/11/2006  Job:  [id number]

## 2011-01-20 NOTE — Op Note (Signed)
[name], [name]                 ACCOUNT NO.:  [id number]   MEDICAL RECORD NO.:  [id number]          PATIENT TYPE:  INP   LOCATION:  2860                         FACILITY:  [facility]   PHYSICIAN:  Kerrin Champagne, M.D.   DATE OF BIRTH:  10-01-1945   DATE OF PROCEDURE:  05/01/2005  DATE OF DISCHARGE:                                 OPERATIVE REPORT   PREOPERATIVE DIAGNOSIS:  Lumbar spinal stenosis, L3-4 centrally with  bilateral L3 and L4 nerve root entrapment.   POSTOPERATIVE DIAGNOSIS:  Lumbar spinal stenosis, L3-4 centrally with  bilateral L3 and L4 nerve root entrapment.  The patient also has findings of  previous T12 and L4 compression fractures.   OPERATION PERFORMED:  Central decompressive laminectomy, L3-4 with  decompression of bilateral L3 and bilateral L4 nerve roots.   SURGEON:  Kerrin Champagne, M.D.   ASSISTANT:  Wende Neighbors, P.A.   ANESTHESIA:  General orotracheal anesthesia, Dr. Jean Rosenthal.   ESTIMATED BLOOD LOSS:  100 mL.   DRAINS:  Hemovac times one.   SPECIMENS:  None.   The patient returned to the PACU in good condition.   INDICATIONS FOR PROCEDURE:  The patient is a 65 year old female who  sustained a T12 compression fracture, was treated by Caralyn Guile. Ramos, M.D.  for pain associated with this, underwent extensive work-up.  Eventually  underwent a vertebroplasty at the T12 level but persisted with pain and  discomfort.  Follow up studies demonstrated severe lumbar spinal stenosis at  the L3-4 level. She has a compression deformity at L4, but this was noted to  be present on previous studies dating back to prior to her presentation with  compression fracture at the T12 level.  The patient is felt to have a healed  superior end plate fracture at the L4 level, a  healed compression fracture  at T12, status post vertebroplasty.  With ongoing problems with neurogenic  claudication, back pain associated with severe lumbar spinal stenosis at the  L4-5 level.   She was brought to the operating room to undergo central  decompressive laminectomy at L3-4.   INTRAOPERATIVE FINDINGS:  The patient was found to have severe lumbar spinal  stenosis at L3-4 associated with hypertrophic ligamentum flavum over the  medial aspect of the facet at the L3-4 level compressing the thecal sac  bilaterally.  Within the patient's left neural foramen, she was found to  have a large synovial cyst versus an extremely large amount of hypertrophic  ligamentum flavum compressing the left L3 nerve root, similarly found to  have significant lateral recess stenosis affecting the right L4 nerve root  on the right side.   DESCRIPTION OF PROCEDURE:  After adequate general anesthesia, the patient  placed into a knee chest position. Andrews frame was used.  Stand  preoperative antibiotics of Ancef.  Standard prep with DuraPrep solution  over the lower dorsal lumbar spine extending down to S2, draped in the usual  manner.  Spinal needle was placed at the expected L4 and L3 level.  The  highest needle felt to be at the L4-5  level.  This on lateral radiograph.  Vi-drape was then placed following marking of the skin and incision made  approximately 9 to 10 cm in length through the skin and subcutaneous layers,  carried down to the lumbodorsal fascia.  Incision made on both sides of the  spinous process, found there, then below and clamps were placed at the  spinous process of L2 and L3.  Intraoperative lateral radiograph  demonstrated these clamps at this level, over the lower aspect of L2 and at  the L3 spinous process.  These areas were marked using a marking pen.  Electrocautery and Cobb then used to elevate the paralumbar muscles off of  the posterior aspect of the elements of the L2, L3, L4 bilaterally.  Bleeders controlled using bipolar and monopolar electrocautery.  Boss  McCullough retractor inserted.   Leksell rongeur then used to resect the inferior one third of the L2  spinous  process, spinous process of L3 and about 50% of the spinous process of L4.  The central portions of the lamina of L3 were then carefully thinned using  Leksell rongeur.   Using regular loupe magnification, then the central portions of lamina of L3  were resected.  Osteotome then used to carefully resect the medial aspect of  the facets at the L3-4 level over about 10% trying to preserve the majority  of the facet, certainly as much as 80% was preserved both sides.  Ligamentum  flavum was resected medially off of the facets both sides and off of the  superior aspect of the lamina of L4.  A foraminotomy performed over  bilateral L4 nerve roots decompressing these nerve roots nicely and  decompressing the lateral recess where nerve root compression over the right  L4 nerve root was felt to be worse than that on the left. The operating room  microscope was brought into the field.  Under this microscope, then central  portions of the superior aspect and lamina of L3 were then resected using 3  mm Kerrison's.  Ligamentum flavum at the L2-3 level was resected centrally  and off medial aspects of the facets both sides, decompressing the lateral  recesses out the adjacent L2-3 level.  Then on the right side carefully, the  pars area and the medial aspect of the facet at L3-4 were then resected and  undercut decompressing the L3 and L4 nerve roots further.  A hockey stick  nerve probe could then easily be passed out the neural foramen of L4  indicating adequate foraminotomy passing both posterior and anterior to the  nerve root.  The pedicle of L4 was easily felt but not visualized about 3 mm  beneath the undercut joint.  The neural foramen for the L3 nerve root was  then carefully further examined where the reflected portion of ligamentum  flavum was resected off the L3 nerve root further decompressing the neural foramen allowing for the hockey stick neural probe to easily pass through   the neural foramen and along the lateral recess at L2-3.  Attention turned  to the left side where decompression of the L4 nerve root was carried out to  perform a foraminotomy of L4 and medial facetectomy of L3-4 previously  carried out with further resection of ligamentum flavum decompressing  lateral recess at L3-4.  At the L3 neural foramen, a great deal of  hypertrophic ligamentum flavum adherent to the thecal sac identified.  This  was carefully freed up from both below and above the nerve  root using a  hockey stick neural probe, a Penfield 4.  The material had the appearance of  synovial cyst in that it was quite adherent to the thecal sac but also had  the appearance of ligamentum flavum as it was quite thick.  It was resected  off the nerve root, nerve root carefully preserved.  A small bleeder within  the axillary area of the nerve root was cauterized using bipolar  electrocautery without difficulty.  Following resection of this, then the  neural foramen for the L3 nerve root on the left side appeared to be well  decompressed.  The lateral recess L3 nerve root at the L2-3 level well  decompressed as well.  Hockey stick neural probe could be easily passed out  the L3 neural foramen as well as L4 on this side.  This completed the  decompression.  Bone wax was applied to the bleeding cancellous bone  surfaces.  Excess bone wax was removed.  Small portion of Gelfoam was placed  over the laminotomy defect posteriorly. There was still some continued  oozing at the end of the case.  A medium Hemovac drain was placed in the  depth of the incision exiting over the right lower lumbar region.  The  paralumbar muscles carefully approximated in the midline with loose suture  of #1 Vicryl. The lumbodorsal fascia reapproximated to the spinous processes  superiorly of L2 using interrupted #1 Vicryl sutures.  The lumbodorsal  fascia itself reapproximated then with interrupted figure-of-eight and   simple sutures of #1 Vicryl.  The subcutaneous layers approximated with  interrupted #1 and 0 Vicryl sutures, more superficial fascial layers with 2-  0 Vicryl sutures and the skin closed with a running subcutaneous stitch of 4-  0  Vicryl.  Tincture of benzoin and Steri-Strips applied.  4 x 4s, ABD pad  fixed to the skin with HypaFix tape. The patient then re-activated,  extubated following return to a supine position and return to the recovery  room in satisfactory condition.      Kerrin Champagne, M.D.  Electronically Signed     JEN/MEDQ  D:  05/01/2005  T:  05/01/2005  Job:  [id number]

## 2011-01-20 NOTE — Discharge Summary (Signed)
[name], [name]                           ACCOUNT NO.:  [id number]   MEDICAL RECORD NO.:  [id number]                   PATIENT TYPE:  INP   LOCATION:  4729                                 FACILITY:  [facility]   PHYSICIAN:  Bonnita Hollow, M.D.         DATE OF BIRTH:  1946-08-09   DATE OF ADMISSION:  05/18/2004  DATE OF DISCHARGE:  05/19/2004                                 DISCHARGE SUMMARY   The patient was discharged with the following diagnoses:  1.  Left hand numbness.  2.  Hypothyroidism.  3.  Headache.   The patient was discharged with the following medications:  1.  Lexapro 10 mg p.o. daily.  2.  Klonopin 1 mg p.o. b.i.d.  3.  Seroquel 600 mg p.o. q.h.s.  4.  Tizanidine 4 mg 1-1/2 pills at bedtime and one pill each morning.  5.  Lamictal 100 mg two tablets at breakfast and two tablets at bedtime.  6.  ProAmatine 5 mg p.o. t.i.d.  7.  Periactin 4 mg 1-1/2 tablet p.o. t.i.d.  8.  Synthroid 100 mcg p.o. daily.  9.  Protonix 40 mg p.o. daily.  10. Mobic 7.5 mg p.o. b.i.d.  11. Florinef acetate 0.1 mg p.o. daily.  12. Frova 2.5 mg p.o. p.r.n.  migraine.  13. OxyContin 40 mg p.o. p.r.n. daily for migraine.   The patient was discharged with the following follow-up appointment.  1.  The patient will follow up at [facility] Tuesday,      May 31, 2004 at 1:30 p.m. to see her primary care doctor, Dr.      Douglass Rivers.   HOSPITAL COURSE:  [name] is a 65 year old white female with a past medical  history of migraines, panic attacks, depression, hypothyroidism, that  presented to the clinic complaining of left hand numbness for 24 hours in  the first four fingers.  The patient has a history of chronic hypotension.  She says that it has been worse lately, no slurred speech but the patient  says that she has been confused but not changed from baseline since seizure  in 2002.  She did not complain of any weakness, did report clumsiness in  left hand. She saw a neurologist two weeks ago whom discontinued her  Florinef, started her on Midrin, first dose was a week ago, hypertension and  falls had gotten worse since then.  1.  Left hand numbness.  The patient was admitted into the hospital and      evaluated for cerebrovascular accident.  CT scan showed no acute infarct      or abnormal intracranial enhancing lesions.  Nonspecific white matter-      type changes.  It shows no fusiform or forceful dilatations in the      distal vertical cervical thickening of the left vertebral artery.  The      patient remained stable throughout her admission.  Blood pressure      remained in the 120-130/80.  The patient was afebrile throughout      admission.  Oxygen saturation remained in the high 90's on room air.      She did not have any episodes of hypotension.  Lipid panel revealed a      cholesterol of 210, triglycerides 279, HDL 66, LDL 88, TSH 2.393 which      was in normal limits.  The patient was improved by admission date.  She      continued to complain of some left hand numbness.  She will need to be      evaluated and followed as an outpatient.  2.  Headache.  The patient was given Frova and OxyContin which are her home      medications for pain control during her hospital stay.  The patient      remained stable without requiring any additional medications.  The      patient will follow up with her primary care doctor.                                                Bonnita Hollow, M.D.    VRE/MEDQ  D:  05/19/2004  T:  05/19/2004  Job:  [id number]   cc:   Douglass Rivers, M.D.  [facility].  [facility]. Resident  [place], [place] [address]  Fax: [phone]   Bescowitz, M.D.  [facility], Neurology Dept.

## 2011-01-20 NOTE — Consult Note (Signed)
NAMEELDENE, PLOCHER                 ACCOUNT NO.:  [id number]   MEDICAL RECORD NO.:  [id number]          PATIENT TYPE:  INP   LOCATION:  2106                         FACILITY:  [facility]   PHYSICIAN:  Garnetta Buddy, M.D.   DATE OF BIRTH:  1946/04/16   DATE OF CONSULTATION:  09/12/2004  DATE OF DISCHARGE:                                   CONSULTATION   REASON FOR CONSULTATION:  Acute renal failure.   HISTORY OF PRESENT ILLNESS:  This is a 65 year old white female status post  narcotic overdose, history of mood disorder, admitted January 8 with some  responsiveness, respiratory distress requiring intubation and mechanical  ventilation.  She displayed signs of shock and required a brief period of  Levophed support.  She was hyperkalemic on admission and required  Kayexalate.  Since admission, she has had diminished urine output with a  fluid balance of positive over 4 liters to nearly 5 liters.   PAST MEDICAL HISTORY:  1.  Migraine headaches.  2.  History of irritable bowel syndrome.  3.  History of depression.  4.  History of hypothyroidism.  5.  History of peptic ulcer disease.  6.  History of ventral hernia repair.  7.  History of exploratory laparotomy May 2004 secondary to bowel      obstruction.  8.  History of colostomy in 2001.  9.  History of hysterectomy.  10. History of cholecystectomy.  11. History of seizure disorder.  12. History of coughing.  13. History of arthralgias.  14. History of TMJ.  15. History of rhinitis.  16. History of overdose secondary to medications 2005.   MEDICATIONS:  Aspirin 81 mg daily, Mobic 7.5 mg b.i.d., Klonopin 1 mg  t.i.d., Lexapro 20 mg daily, Seroquel 200 mg daily, Lamictal 100 mg in the  morning, 100 mg at lunch, 200 mg at bedtime, Metandren 5 mg t.i.d.,  Synthroid 100 mcg daily, Protonix 40 mg daily, Florinef 0.1 mg daily,  Zanaflex 4 mg in the morning, 4 mg at lunch, 6 mg in the evening, OxyContin  40 mg p.r.n.   ALLERGIES:   Erythromycin and Z-Pak.   SOCIAL HISTORY:  She is married, lives with husband.   FAMILY HISTORY:  Noncontributory.   REVIEW OF SYMPTOMS:  GENERAL:  She has a low grade fever.  She denies any  fatigue or weakness.  EYES:  No visual complaints, no eye redness, no loss  of vision.  EARS, NOSE AND THROAT:  No hearing loss, no nasal discharge, no  sore throat, no mouth lesions.  CARDIOVASCULAR:  No history of myocardial  infarction, no orthopnea or PND, she has lower extremity edema.  RESPIRATORY  SYSTEM:  She is now extubated, was intubated for a brief period of time,  there is no cough, there is no shortness of breath.  ABDOMINAL SYSTEM:  No  nausea, no vomiting, no diarrhea, no increasing abdominal girth, no  abdominal pain.  UROGENITAL:  She had a Foley catheter placed to straight  drain.  She denies any urinary symptoms.  NEUROLOGIC:  She is alert and  responsive,  follows commands.  MUSCULOSKELETAL:  She has a history of  arthralgias, history of Mobic use.  HEMATOLOGIC/ONCOLOGIC:  No history of  cancer.  No history of rheumatological disorder.  ENDOCRINE:  No history of  diabetes, history of hypothyroidism.   PHYSICAL EXAMINATION:  VITAL SIGNS:  Blood pressure 100 to 110 systolic, pulse 90, temperature  98.4, saturation 98%.  GENERAL:  Alert, pleasant lady, in no obvious distress.  HEAD AND EYES:  Normocephalic, atraumatic, pupils round, equal, reactive,  extraocular movements intact.  EARS, NOSE AND THROAT:  External appearance revealed slight lip trauma to  the right lower lip margin, otherwise, pharyngeal mucosa was intact.  Teeth  were intact.  NECK:  Supple, no thyromegaly, no adenopathy, JVD not elevated.  No carotid  bruits.  CARDIOVASCULAR:  No heaves, rubs, thrills, or gallops.  Regular rate and  rhythm.  RESPIRATORY SYSTEM:  Lung fields were clear to auscultation bilaterally, no  wheezes, no rales.  ABDOMEN:  Soft, nontender, Foley catheter to straight drain.   EXTREMITIES:  2+ pulses bilaterally, no cyanosis, no clubbing, 2+ edema  noted.  INS AND OUTS:  Approximately 5 liters, urine output 5-10 mL an hour.   LABORATORY DATA:  INR 1.6.  Sodium 148, potassium 3.3, chloride 115, CO2 23,  BUN 20, creatinine 4.1, chloride 101.  Cortisol 20.3.  TSH 0.83.  Tylenol  salicylate lithium screen negative.  Tricyclic, antidepressants, opiates,  benzo positive.  Alkaline phos 158, AST 6875, ALT 4334, bilirubin 0.9.  Ethanol 25 mg per dl.  Chest x-ray no infiltrate.  No pneumo.  Urinalysis  with small blood, no protein.  3-6 WBCs.  Blood cultures one set positive  for gram positive cocci.  EKG left axis deviation, left fascicular block,  first degree AV block, sinus rhythm, partial right bundle branch block.   ASSESSMENT AND PLAN:  1.  Acute renal failure in the setting of hemodynamic shock secondary to      medication overdose, now oliguric, also taking Mobic, electrolytes      appear stable, will attempt to diureses with Lasix IV, no indications      for dialysis at the present, her clinical picture seems to be improving      from a cardiopulmonary and neurologic standpoint.  Will check a renal      ultrasound to rule out obstruction.  2.  Hypokalemia, will give the patient intravenous potassium chloride 10 mEq      x 1.  3.  Avoid nephrotoxins.  4.  Decrease IV fluids to 50 mL an hour.       MWW/MEDQ  D:  09/12/2004  T:  09/12/2004  Job:  [id number]

## 2011-01-20 NOTE — Discharge Summary (Signed)
[place]  Patient:    [name], [name]                        MRN: [id number] Adm. Date:  [id number] Disc. Date: [id number] Attending:  Arlis Porta CC:         Anselmo Rod, M.D.  Fortunato Curling, M.D.  Kizzie Furnish, M.D.   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  SECONDARY DIAGNOSES: 1. Hypothyroidism. 2. Migraine headaches. 3. Depression. 4. Anxiety disorder.  PROCEDURES:  Vagotomy antrectomy with Billroth I reconstruction on June 12, 2000.  REASON FOR ADMISSION:  [name] is a 65 year old female who has had significant peptic ulcer disease.  She has been taking nonsteroidals for a long period of time.  She has tried proton pump inhibitors, but it appears that her disease has advanced and now she has intermittent nausea and vomiting with weight loss and is unable to keep solids down secondary to gastric outlet obstruction from chronic scarring.  She was admitted for partial gastrectomy.  HOSPITAL COURSE:  The patient tolerated the procedure well.  She had quite a high narcotic tolerance and thus she was placed on a Dilaudid PCA.  She did have some postoperative acute blood loss anemia and she was given 40,000 units of Procrit.  I placed her back on antidepressant medicines as directed by Fortunato Curling, M.D., and her mood was stable.  She subsequently had the nasogastric tube removed and then was started on a diet with mild dumping symptoms.  However, with a restricted diet these resolved and by her seventh postoperative day she was able to be discharged.  DISPOSITION:  Discharged to home on June 19, 2000.  DIET:  She was given dietary instructions.  ACTIVITY:  Limited to no heavy lifting, no straining and no driving.  DISCHARGE MEDICATIONS:  She is to continue all of her home medications with respect to her psychiatric disorder as directed  by Fortunato Curling, M.D. She was given Mepergan for pain.  She will call to see me in two weeks. DD:  06/29/00 TD:  06/30/00 Job: [id number]

## 2011-01-20 NOTE — Discharge Summary (Signed)
[name], [name]                           ACCOUNT NO.:  [id number]   MEDICAL RECORD NO.:  [id number]                   PATIENT TYPE:  INP   LOCATION:  5727                                 FACILITY:  [facility]   PHYSICIAN:  Adolph Pollack, M.D.            DATE OF BIRTH:  [birth year]   DATE OF ADMISSION:  09/23/2003  DATE OF DISCHARGE:  09/26/2003                                 DISCHARGE SUMMARY   PRINCIPAL DISCHARGE DIAGNOSIS:  Ventral incisional hernia.   SECONDARY DIAGNOSES:  1. Hypothyroidism.  2. Migraine headaches.  3. Depression.  4. Anxiety.  5. Seizure disorder.  6. Peptic ulcer disease.  7. Chronic pain syndrome.  8. Irritable bowel syndrome.  9. Allergic rhinitis.   PROCEDURE:  Laparoscopic ventral incisional hernia repair with mesh.   INDICATIONS:  [name] who has had multiple abdominal  operations including an exploratory laparotomy for small bowel obstruction.  She has developed an uncomfortable incisional hernia and now presents for  repair.   HOSPITAL COURSE:  She underwent the above procedure.  Postoperatively she  was maintained on a full dose of Dilaudid PCA.  Initially she was concerned  about her pain control, but this improved.  She was mobilized early.  She  was placed back on her psychiatric and anti-seizure medications.  Her Foley  was removed on her third postoperative day and she was walking and feeling  better.  By her fourth postoperative day she was tolerating a diet, passing  gas and wanting to go home.  The wounds were clean and intact.   DISPOSITION:  Discharge to home on September 26, 2003 in satisfactory  condition.  She was told to resume all of her home medications.  She was  given activity restrictions and encouraged to walk.  She was given Tylox for  pain and will follow-up with me in the office in approximately two to three  weeks.  Of note, OpSite or Decaderm dressings tended to cause some  blistering on her so  we told her to try to avoid these if possible in the  future.                                                Adolph Pollack, M.D.    Kari Baars  D:  10/26/2003  T:  10/26/2003  Job:  [id number]   cc:   Billey Gosling, M.D.  [address] [place], [place] [address]  Fax: [phone]   Christene Slates. Katrinka Blazing, M.D.  [address]  [place]  [place] [address]  Fax: [phone]   Adolph Pollack, M.D.  [address]  [place]  [place] [address]  Fax: [phone]   Gershon Crane, M.D.  [place], [place]

## 2011-01-20 NOTE — Discharge Summary (Signed)
[name], [name]                           ACCOUNT NO.:  [id number]   MEDICAL RECORD NO.:  [id number]                   PATIENT TYPE:  INP   LOCATION:  5729                                 FACILITY:  [facility]   PHYSICIAN:  Adolph Pollack, M.D.            DATE OF BIRTH:  29-Jan-1946   DATE OF ADMISSION:  01/14/2003  DATE OF DISCHARGE:  01/28/2003                                 DISCHARGE SUMMARY   PRINCIPAL DISCHARGE DIAGNOSIS:  Small bowel obstruction.   SECONDARY DIAGNOSES:  1. Hypothyroidism.  2. Migraine headaches.  3. Allergic rhinitis.  4. Depression.  5. Anxiety.  6. Seizure disorder.  7. Peptic ulcer disease.  8. Chronic pain syndrome.  9. Irritable bowel syndrome.   PROCEDURE:  Exploratory laparotomy, lysis of adhesions, and ventral hernia  repair on Jan 20, 2003.   REASON FOR ADMISSION:  [name] is a 65 year old female who has had  abdominal operations in the past.  She presented to the emergency department  when she began having some central crampy abdominal pain.  She was noted at  that time to have dilatation of the small bowel with ileus versus partial  small bowel obstruction.  She was admitted to the hospital by Dr. Chevis Pretty.  At that time, [facility] Teaching service medical consultation was  obtained.   HOSPITAL COURSE:  She was started on a liquid type diet and had an upper GI  which showed the stomach to be upper limits of thickness.  Small bowel  obstruction evidence was noted.  She actually started to improve with less  abdominal pain, having some bowel sounds, and it was felt that a partial  small bowel obstruction was resolving.  We took some x-rays, and they were  consistent with small bowel obstruction without improvement on Jan 20, 2003,  so she was subsequently taken to the operating room where she underwent the  above procedure.  Postoperatively, she had some mild hypovolemia which  responded well to volume resuscitation.  She did  have difficulty with pain  control, and requested Dilaudid, so her PCA was changed to Dilaudid.  She  had some postoperative blood loss anemia.  She also had some malnutrition  and TNA was started.  She slowly improved and began passing gas, and we were  able to start her diet.  Medical issues were addressed by the [facility] Teaching service.  Her total parenteral nutrition was discontinued.  The wound looked good.  She was having bowel movements, and she was able to  be discharged on Jan 28, 2003, in satisfactory condition.   DISPOSITION:  Discharged to home in satisfactory condition on Jan 28, 2003.   DISCHARGE MEDICATIONS:  1. Synthroid.  2. Iron.  3. Seroquel.  4. Florinef.  5. Colace.  6. Protonix.  7. Tenox.   ACTIVITY:  She is given activity restrictions.   FOLLOWUP:  She will  follow up with Dr. Billey Gosling at the [facility] on February 05, 2003, and follow up with me in two to three weeks.                                                Adolph Pollack, M.D.    Kari Baars  D:  04/06/2003  T:  04/06/2003  Job:  [id number]   cc:   Billey Gosling, M.D.  [address] [place], [place] [address]  Fax: [phone]

## 2011-01-20 NOTE — H&P (Signed)
[name], SCHEIBE                 ACCOUNT NO.:  [id number]   MEDICAL RECORD NO.:  [id number]          PATIENT TYPE:  INP   LOCATION:  1506                         FACILITY:  [facility]   PHYSICIAN:  Marlowe Kays, M.D.  DATE OF BIRTH:  [birth year]   DATE OF ADMISSION:  06/30/2006  DATE OF DISCHARGE:                                HISTORY & PHYSICAL   This 65 year old female was sleeping earlier today, the doorbell rang, she  got up, somewhat sleepy, twisted her right ankle and fell.  X-rays in the  [facility] Room this afternoon demonstrated a spiral displaced  fracture distal tibia.  No involvement of the mortise or the fibula.  She  will be admitted at this time to take to the operating room for a closed  versus open reduction and internal fixation.   PAST MEDICAL HISTORY:  Her medical history is quite extensive.  1. Hypothyroidism.  2. Depression.  3. Irritable bowel syndrome.  4. Peptic ulcer disease with a partial gastrectomy.  5. Migraine headaches.   PAST SURGICAL HISTORY:  Surgical history is also quite extensive.  1. Back surgery due to fractures.  2. The partial gastrectomy noted above.  3. October 3rd, she had a bladder repair and a vaginal support performed      at [facility] without complications.   DRUG ALLERGIES:  1. Z-PAK.  2. ERYTHROMYCIN.  3. DAIRY PRODUCTS.   MEDICATIONS:  These are all listed on a sheet the patient has with her.  1. Cymbalta 60 mg daily.  2. Klonopin 1 mg b.i.d. to t.i.d.  3. Seroquel 200 mg one-half at bedtime.  4. Rozerem 8 mg one at h.s.  5. Zanaflex 4 mg in the morning and at supper, and half a one at bedtime.  6. Lyrica 100 mg t.i.d.  7. Synthroid 100 mcg daily.  8. Protonix 40 mg daily.  9. Mobic 7.5 mg at breakfast and at supper.  10.Boniva 150 mg once a month.  11.She has some p.r.n. medicine she takes for migraine headaches - Frova,      Reglan, Midrin, and Oxy-Contin.  12.Other as needed medicines - Flonase,  Cholestyramine, and Oxy-IR.  13.Multiple vitamins and supplements.   PHYSICAL EXAMINATION:  GENERAL:  A fairly overweight white female,  reasonably comfortable lying on the examining couch with a splint on her  right lower extremity.  HEENT:  Normal.  NECK:  Supple without masses.  LUNGS:  Clear.  HEART:  Regular rhythm without murmur, rub, or gallop.  BREASTS:  Examination not done.  ABDOMEN:  Obese, soft, and nontender.  GENITALIA AND RECTAL:  Not done, not felt pertinent to the present illness.  ORTHOPEDIC EXAMINATION:  Positive findings related to the right lower  extremity.  She has intact sensation and circulation in her right foot.   ADMISSION DIAGNOSES:  1. Closed, displaced, distal right tibial fracture.  2. History of depression.  3. History of irritable bowel syndrome.  4. Hypothyroidism.  5. Migraine headaches.  6. Obesity.           ______________________________  Marlowe Kays, M.D.  JA/MEDQ  D:  06/30/2006  T:  07/01/2006  Job:  [id number]

## 2011-01-20 NOTE — Op Note (Signed)
[name], [name]                 ACCOUNT NO.:  [id number]   MEDICAL RECORD NO.:  [id number]          PATIENT TYPE:  INP   LOCATION:  9305                          FACILITY:  [facility]   PHYSICIAN:  Randye Lobo, M.D.   DATE OF BIRTH:  [birth year]   DATE OF PROCEDURE:  06/11/2006  DATE OF DISCHARGE:                                 OPERATIVE REPORT   PREOPERATIVE DIAGNOSES:  1. Genuine stress incontinence.  2. Incomplete vaginal prolapse.   POSTOPERATIVE DIAGNOSES:  1. Genuine stress incontinence.  2. Incomplete vaginal prolapse.   PROCEDURES:  1. Tension-free vaginal tape suburethral sling.  2. Cystoscopy.  3. Anterior and posterior colporrhaphy.   SURGEON:  Randye Lobo, M.D.   ASSISTANT:  Luvenia Redden, M.D.   ANESTHESIA:  LMA, local with 0.5% lidocaine with 1:200,000 of epinephrine.   IV FLUIDS:  1700 mL Ringer's lactate.   ESTIMATED BLOOD LOSS:  150 mL.   URINE OUTPUT:  400 mL.   COMPLICATIONS:  None.   INDICATIONS FOR PROCEDURE:  The patient is a 65 year old a para 1 Caucasian  female who is status post total vaginal hysterectomy with bilateral salpingo-  oophorectomy, who presents with a complaint of urinary incontinence while  sleeping.  The patient has a history of urinary incontinence with physical  activity in addition to a history of nocturia.  The patient has been treated  with Detrol LA with no amelioration of her symptoms.  On physical  examination, the patient was noted to have good anterior vaginal wall  support with evidence of a second-degree rectocele.  Urodynamic testing  confirmed the presence of genuine stress incontinence.  A plan is now made  to proceed with a tension-free vaginal tape with cystoscopy and prolapse  repair after risks, benefits and alternatives are discussed.   FINDINGS:  Exam under anesthesia revealed a second-degree cystocele, a  second-degree rectocele, and good vaginal apical support.  The cervix and  uterus were  absent.   Cystoscopy demonstrated the absence of foreign bodies in the bladder and the  urethra.  The bladder was visualized throughout 360 degrees including the  bladder dome and trigone.  The ureters were noted to be patent bilaterally.   PROCEDURE:  The patient was reidentified in the preoperative hold area.  She  received Ancef 1 gram IV for antibiotic prophylaxis.  The patient received  TED hose and PAS stockings for DVT prophylaxis.   In the operating room, an LMA anesthetic was administered and the patient  was then placed in the dorsal lithotomy position.  The patient had an  examination under anesthesia.  The rectum was noted to be full of stool, and  this was manually removed.  The vagina and perineum and lower abdomen was  then sterilely prepped and draped.  A Foley catheter was placed inside the  bladder.   An Allis clamp was placed at the base of the patient's cystocele and along  the midline of the anterior vaginal wall up to the level of 1 cm below the  urethral meatus.  The vaginal tissue was injected  with 0.5% lidocaine with  1:200,000 of epinephrine.  The vaginal mucosa was then incised in the  midline.  The vaginal mucosa was dissected off of the underlying bladder and  endopelvic fascia using a combination of sharp and blunt dissection.  The  dissection was carried back to the level of the pubic rami bilaterally and  down to the level of the bottom of the cystocele.  Hemostasis was created  using monopolar cautery.   The suprapubic incisions were then marked approximately 2.5 to 3 cm to the  right and left of the midline.  The TVT procedure was performed in a top-  down fashion.  The TVT abdominal needle passer was placed first through the  right suprapubic incision and out through the vagina at the level of the  midurethra and lateral to it on the ipsilateral side.  The same procedure  was then performed on the left-hand side in a similar top-down fashion.  the   Foley catheter was removed and cystoscopy was performed and the findings  were as noted above.  The sling was then attached to the abdominal needle  passers and was drawn up through the suprapubic incisions bilaterally.  Final tension on the sling was adjusted and the plastic sheaths were then  removed while a Kelly clamp was placed between the sling and the bladder.  Excess sling material was then trimmed.  An anterior colporrhaphy was  performed with vertical mattress sutures of 2-0 Vicryl.  Excess vaginal  mucosa was trimmed anteriorly and the anterior vaginal wall was closed with  a running locked suture of 2-0 Vicryl.   The posterior colporrhaphy was performed next.  Allis clamps were used to  mark the midline of the posterior vaginal wall.  The perineal body and the  posterior vaginal mucosa were then injected with 0.5% lidocaine with  1:200,000 of epinephrine.  A triangular wedge of epithelium was excised from  the perineal body.  The posterior vaginal wall was then incised vertically  with a Metzenbaum scissors.  The perirectal fascia was dissected off of the  overlying vaginal mucosa bilaterally.  The rectocele was reduced with a  combination of 2-0 Vicryl and 0 Vicryl sutures.  The first suture at the top  of the rectocele repair was a figure-of-eight suture of 0 Vicryl.  Below  this were a series of vertical mattress sutures of 2-0 Vicryl which  transitioned down to vertical mattress sutures of 0 Vicryl at the level of  the midvagina.  A crown stitch of 0 Vicryl was placed for perineal body  support.  Excess vaginal mucosa was trimmed and the posterior vaginal wall  was then closed with a running locked suture of 2-0 Vicryl, which was  continued along the perineal body in a subcuticular fashion.   Final rectal exam confirmed the absence of sutures in the rectum.  The  vagina was then packed with an Estrace gauze packing.  The suprapubic  incisions were closed with  Dermabond.   This concluded the patient's procedure.  There were no complications.  All  needle, instrument, and sponge counts were correct.      Randye Lobo, M.D.  Electronically Signed     BES/MEDQ  D:  06/11/2006  T:  06/12/2006  Job:  [id number]

## 2011-01-20 NOTE — Discharge Summary (Signed)
NAMEJESLY, HARTMANN                 ACCOUNT NO.:  [id number]   MEDICAL RECORD NO.:  [id number]          PATIENT TYPE:  INP   LOCATION:  5527                         FACILITY:  [facility]   PHYSICIAN:  Madeleine B. Vanstory, M.D.DATE OF BIRTH:  [birth year]   DATE OF ADMISSION:  09/11/2004  DATE OF DISCHARGE:  09/28/2004                                 DISCHARGE SUMMARY   ADDENDUM:   DISCHARGE MEDICATIONS:  1.  Mobic 7.4 mg p.o. b.i.d.  2.  Frova 2.5 mg p.r.n. headache.   FOLLOW UP:  With Dr. Rob Bunting on February 6 at 9 a.m.   Creatinine on discharge was 2.0, back to her admission creatinine baseline.      MBV/MEDQ  D:  09/29/2004  T:  09/29/2004  Job:  [id number]   cc:   Billey Gosling, M.D.  [address] [place], [place] [address]  Fax: [phone]

## 2011-01-20 NOTE — Procedures (Signed)
[place]  Patient:    [name], [name]                        MRN: [id number] Proc. Date: 04/18/00 Adm. Date:  [id number] Attending:  Charna Elizabeth CC:         Kizzie Furnish, M.D.                           Procedure Report  DATE OF BIRTH:  [birth year]  PROCEDURE PERFORMED:  Esophagogastroduodenoscopy with biopsies.  ENDOSCOPIST:  Anselmo Rod, M.D.  INSTRUMENT USED:  Olympus video panendoscope.  INDICATIONS: Severe epigastric pain in a 65 year old white female.  Rule out peptic ulcer disease.  PREPROCEDURE PREPARATION:  Informed consent was procured from the patient. The patient was fasted for 8 hours prior to the procedure.  PREPROCEDURE PHYSICAL:  Patient has stable vital signs.  NECK:  Supple.  CHEST:  Clear to auscultation. S1, S2 regular.  ABDOMEN:  Obese with severe epigastric tenderness on palpation with guarding. No rebound, rigidity, no hepatosplenomegaly, no masses palpable.  DESCRIPTION OF PROCEDURE:  The patient was placed in left lateral decubitus position and sedated with 100 mg of Demerol and 10 mg of Versed intravenously. Once the patient was adequately sedated and maintained on low-flow oxygen and continuous cardiac monitoring, the Olympus video panendoscope was advanced through the mouth piece, over the tongue into the esophagus under direct vision.  The entire esophagus appeared normal without evidence of rings, strictures, masses, lesions, esophagitis or Barretts mucosa.  The scope was then advanced into the stomach.  There was a large amount of solid debris seen in the high fundus.  There was no evidence of hiatal hernia seen on retroflexion.  The scope was then advanced to the mid body and antrum.  There was a large ulcer extending from the prepyloric area into the pyloric channel. The duodenal bulb and the small bowel distal to the bulb up to 60 cm appeared normal.  Biopsies were done from the ulcer to  rule out presence of Helicobacter pylori by pathology.  The patient tolerated the procedure well without complications.  IMPRESSION: 1. Normal-appearing esophagus. 2. Large amount of solid residual debris in the stomach. 3. Large antral ulcer extending to the pyloric channel. 4. Biopsies done from the ulcer to rule out Helicobacter pylori by pathology. 5. Normal duodenal bulb and proximal small bowel up to 60 cm. 6. Slightly stricture of pylorus.  RECOMMENDATIONS: 1. Carafate slurry 1 g q.i.d. To be continued for the next month. 2. Nexium 40 mg one p.o. q.day to be taken on a regular basis until further recommendations are made. 3. Treat with antibiotics if Helicobacter pylori present on biopsies. 4. Gastric emptying study to be scheduled in the near future. DD:  04/18/00 TD:  04/18/00 Job: [id number]

## 2011-01-20 NOTE — Discharge Summary (Signed)
NAMEAARIYANA, MANZ                 ACCOUNT NO.:  [id number]   MEDICAL RECORD NO.:  [id number]          PATIENT TYPE:  INP   LOCATION:  5021                         FACILITY:  [facility]   PHYSICIAN:  Kerrin Champagne, M.D.   DATE OF BIRTH:  [birth year]   DATE OF ADMISSION:  05/01/2005  DATE OF DISCHARGE:  05/03/2005                                 DISCHARGE SUMMARY   ADMISSION DIAGNOSES:  1.  Lumbar spinal stenosis lumbar vertebrae-3-4 centrally with bilateral      lumbar vertebrae-3 and lumbar vertebrae-4 nerve root entrapment.  2.  Severe migraine headaches.  3.  Anxiety and depression.  4.  Peptic ulcer disease with partial gastrectomy.  5.  Irritable bowel syndrome with chronic diarrhea.  6.  Hypothyroidism.  7.  Iron-deficiency anemia.  8.  History of osteoporosis with thoracic vertabrae-12 compression fracture      status post vertebroplasty.  9.  Status post cholecystectomy, hysterectomy and abdominal hernia repair.  10. History of removal of skin cancers.   DISCHARGE DIAGNOSIS:  1.  Lumbar spinal stenosis lumbar vertebrae-3-4 centrally with bilateral      lumbar vertebrae-3 and lumbar vertebrae-4 nerve root entrapment.  2.  Severe migraine headaches.  3.  Anxiety and depression.  4.  Peptic ulcer disease with partial gastrectomy.  5.  Irritable bowel syndrome with chronic diarrhea.  6.  Hypothyroidism.  7.  Iron-deficiency anemia.  8.  History of osteoporosis with thoracic vertabrae-12 compression fracture      status post vertebroplasty.  9.  Status post cholecystectomy, hysterectomy and abdominal hernia repair.  10. History of removal of skin cancers.  11 Status post previous Thoracic vertabrae-12 and lumbar vertebrae-4  compression fractures.   PROCEDURE:  Central decompressive laminectomy L3-4 with decompression of  bilateral L3 and bilateral L4 nerve roots performed by Dr. Otelia Sergeant assisted by  Maud Deed, P.A.C. under general anesthesia.   CONSULTATIONS:  None.   BRIEF HISTORY:  The patient is a 65 year old female with progressive ongoing  neurogenic claudication associated with severe lumbar spinal stenosis. MRI  studies have demonstrated severe lumbar spinal stenosis at the L3-4 level.  It was felt she would require surgical intervention for definitive treatment  as she had failed conservative treatment and was admitted for the procedure  as stated above.   BRIEF HOSPITAL COURSE:  The patient tolerated the procedure under general  anesthesia without complications. Postoperatively, neurovascular motor  function of the lower extremities was noted to be intact. The patient was  fitted with an LS corset postoperatively and this was utilized during  physical therapy. Her Hemovac drain was discontinued on the first  postoperative day and dressing changes were done daily thereafter showing  the wound to be healing well. The patient did require IV analgesics  initially and was weaned to p.o. analgesics without difficulty. Physical  therapy assisted her with ambulation and gait training and she was stable  utilizing a walker. The patient was voiding well and having flatus with her  being afebrile and vital signs stable and on the second postoperative day  was discharged to her  home for continuation of her recovery.   PERTINENT LABORATORY VALUES:  Admission CBC was within normal limits.  Postoperative hemoglobin and hematocrit 11.4 and 33.8 respectively.  Chemistry studies on admission were within normal limits with exception of  creatinine 1.4, total protein 5.8. Repeat chemistries on the first  postoperative day with glucose 139, BUN 5, remaining values normal. EKG on  admission at normal sinus rhythm with first degree AV block, left axis  deviation, nonspecific intraventricular conduction block, new since  May 18, 2004 confirmed by Dr. Othelia Pulling.   PLAN:  The patient was discharged to home. She was instructed in continuing  home medications  as prior to admission. She was given prescriptions for  OxyContin 20 milligrams 2 q.12h. and OxyIR 1-2 every 4-6 hours as needed for  pain. Arrangements were made for home health physical therapy, occupational  therapy as well as a bath aide to assist at home. The patient was advised to  use over-the-counter stool softeners or laxative as needed. She will change  her dressing daily and will be allowed to shower if there is no wound  drainage. She will utilize a walker for ambulation and wear her corset when  ambulating only. The patient will continue on a regular diet. She was  instructed to follow up with Dr. Otelia Sergeant two weeks from the day of her  surgery.   CONDITION ON DISCHARGE:  Stable.      Wende Neighbors, P.A.      Kerrin Champagne, M.D.  Electronically Signed    SMV/MEDQ  D:  07/26/2005  T:  07/27/2005  Job:  [id number]
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
## 2011-01-20 NOTE — Op Note (Signed)
[place]  Patient:    [name], [name]                        MRN: [id number] Proc. Date: 06/12/00 Adm. Date:  [id number] Attending:  Arlis Porta CC:         Anselmo Rod, M.D.  Kizzie Furnish, M.D.  Fortunato Curling, M.D.   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  OPERATION PERFORMED:  Vagotomy and antrectomy with Billroth I reconstruction.  SURGEON:  Adolph Pollack, M.D.  ASSISTANT:  Sheppard Plumber. Earlene Plater, M.D.  ANESTHESIA:  General.  ANESTHESIOLOGIST:  Dr. Laverle Hobby.  INDICATIONS FOR PROCEDURE:  The patient is a 65 year old female who has been suffering from persistent peptic ulcer disease.  She has been taking persistent nonsteroidals despite advice.  She has been on proton pump inhibitor therapy.  She has developed some gastric outlet obstruction with loss of weight and vomiting of food.  Despite stopping the nonsteroidals, she still has her symptomatology.  She now presents for the above procedure.  DESCRIPTION OF PROCEDURE:  She was placed supine on the operating table and a general anesthetic was administered.  Her abdomen was sterilely prepped and draped.  An upper midline incision was made incising the skin sharply and dividing the subcutaneous fat which was quite deep down to the level of the fascia.  The fascia was incised in its midline portion and then the peritoneum was incised as well.  Once inside the abdominal cavity, the liver appeared normal and the spleen appeared normal.  I palpated the enlarged edematous pylorus.  I first began with the vagotomy.  The thin area of the gastrohepatic ligament was entered.  Using careful blunt dissection the esophagus was surrounded and placed under tension.  I identified the posterior vagus nerve, clipped  it up high and below and divided it and sent a specimen for pathologic evaluation.  I then identified the anterior vagus nerve, clipped it proximally and distally, divided it and sent a specimen for pathologic evaluation.  I also found an accessory nerve and divided it between clips.  I noted no other nerves on the esophagus.  Next I approached the stomach and identified the pylorus.  I then identified the junction of the body and the antrum.  I entered the lesser sac and divided the gastrocolic omental attachments all the way up to the junction of the body and antrum.  I then divided some of the lesser omentum in a similar fashion. I mobilized part of the duodenum downto the first portion.  I subsequently transected the stomach at the antral body junction and transected the duodenum in the midpart of its first portion.  I examined the pylorus and it was edematous and scarred.  This was sent for pathologic evaluation.  The body and the first portion of the duodenum were easily approximated and so a Billroth I reconstruction was performed with simple interrupted 3-0 silk sutures.  The anastomosis was patent, viable and under no tension.  A patch of omentum was placed over the anterior wall.  The abdominal cavity was irrigated and no bleeding was noted.  The fascia was subsequently closed with a running #1 PDS suture.  The subcutaneous tissue was irrigated and the skin was closed with staples.  The patient tolerated the procedure well without any  complications and was taken to the recovery room in satisfactory condition. DD:  06/12/00 TD:  06/13/00 Job: [id number]

## 2011-01-20 NOTE — H&P (Signed)
NAMEJACQUELENE, [name]                           ACCOUNT NO.:  [id number]   MEDICAL RECORD NO.:  [id number]                   PATIENT TYPE:  INP   LOCATION:  5729                                 FACILITY:  [facility]   PHYSICIAN:  Ollen Gross. Vernell Morgans, M.D.              DATE OF BIRTH:  [birth year]   DATE OF ADMISSION:  01/14/2003  DATE OF DISCHARGE:                                HISTORY & PHYSICAL   CHIEF COMPLAINT:  Abdominal pain.   HISTORY OF PRESENT ILLNESS:  The patient is a 66 year old white female who  has a history of multiple medical problems including peptic ulcer disease  for which she had a antrectomy and Billroth I reconstruction about three  years ago.  She was apparently in her normal state of health until Monday,  at which time she was complaining of some central abdominal pain.  Her  husband treated her with some OxyContin which she takes some times for her  migraine.  She rested comfortably after that, but when she awoke, she  continued to have abdominal pain.  The pain is not lessened in any way and  they sought medical attention today.  She denies any nausea or vomiting. She  does state that she has been passing flatus but it has been a couple of days  since she has had a bowel movement.  Her husband is not sure of this  information and thinks that she has some short-term memory loss.  She  otherwise denies chest pain, shortness of breath, diarrhea, or dysuria.  Her  other review of systems is unremarkable.   PAST MEDICAL HISTORY:  1. Significant for peptic ulcer disease.  2. Migraine headaches.  3. Hypotension.  4. Hypothyroidism.  5. Irritable bowel syndrome.  6. 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   PAST SURGICAL HISTORY:  1. Hysterectomy.  2. Antrectomy.  3. Vagotomy.  4. Billroth I reconstruction.   MEDICATIONS:  1. Lexapro 10 mg q.d.  2. Clonazepam 1 mg t.i.d.  3. Seroquel 25 mg q.a.m., 25 mg q.p.m., and 350 mg at bed time.  4. Zonegran 400 mg  at bed time.  5. Zanaflex 6 mg at bed time.  6. Allegra 180 mg q.d.  7. Synthroid 100 mcg q.d.  8. Mobic 7.5 mg b.i.d.  9. Florinef 0.1 mg b.i.d.  10.      Frova 2.5 mg p.r.n.  11.      Reglan 10 mg p.r.n.  12.      Midrin.  13.      OxyContin.  14.      Flonase.   ALLERGIES:  AZITHROMYCIN and ERYTHROMYCIN.   SOCIAL HISTORY:  She denies the use of alcohol or tobacco products.   FAMILY HISTORY:  Unremarkable.   PHYSICAL EXAMINATION:  VITAL SIGNS:  Temperature is 97.4, blood pressure is  134/80, pulse of 105.  GENERAL:  She is a well-developed,  well-nourished white female in no acute  distress.  SKIN:  Warm and dry with no jaundice.  HEENT:  Her extraocular movements are intact.  Pupils are equal, round, and  reactive to light.  Sclerae are nonicteric.  NECK:  No bruits.  Trachea is midline.  I cannot palpate thyroid masses.  LUNGS:  Clear bilaterally with no use of accessory muscles.  HEART:  Regular rate and rhythm with impulse on the right chest.  ABDOMEN:  Soft.  Very mild distention if any.  No guarding or perineal  signs. I cannot palpate any masses or hepatosplenomegaly.  She is somewhat  obese and difficult to feel.  EXTREMITIES:  No clubbing, cyanosis, or edema.  NEUROLOGICAL:  She is alert and oriented x 3 today.  She does seem to be a  little bit anxious in the room.   LABORATORY DATA:  She has a white count of 7900.  Amylase of 60.  Her  urinalysis is positive.   She underwent a CT scan.  On the review of the CT scan with the radiologist,  there is diffuse dilatation of the small bowel with no obvious transition  zone.   ASSESSMENT/PLAN:  This is a 65 year old white female with multiple medical  problems who has had a two to three day history of central sort of abdominal  pain with no nausea or vomiting.  Her radiologic tests seem to be more  consistent with an ileus versus a partial small bowel obstruction.  With her  multiple medical problems and prior  abdominal surgical history, she is  certainly not straightforward.  We will plan to admit her for bowel rest and  intravenous hydration.  We will treat her with Cipro for a urinary tract  infection.  I have also asked the [facility] to  consult on her and help manage her medical issues while she is here in the  hospital.  We will admit her and follow her abdominal exam serially and see  how she feels with this treatment.                                               Ollen Gross. Vernell Morgans, M.D.    PST/MEDQ  D:  01/14/2003  T:  01/14/2003  Job:  [id number]

## 2011-01-20 NOTE — Discharge Summary (Signed)
[place]  Patient:    [name], [name] Visit Number: [id number] MRN: [id number]          Service Type: MED Location: [phone] Attending Physician:  Willow Ora Dictated by:   Emelda Fear, M.D. Admit Date:  07/10/2001 Discharge Date: 07/20/2001   CC:         Kizzie Furnish, M.D.  Fortunato Curling, M.D.  Dr. Charlyne Petrin, Psychiatry  Jesse Sans. Wall, M.D. [facility]   Discharge Summary  DATE OF BIRTH:  Jan 29, 1946.  CONSULTS: 1. Dr. Jeanie Sewer of psychiatry. 2. [facility]. 3. Critical care team.  PROCEDURES:  Echocardiogram.  PRIMARY CARE PHYSICIAN:  Dr. Andres Ege, [place], [place].  DISCHARGE DIAGNOSES: 1. Altered mental status/delirium. 2. Serotonin syndrome. 3. Seizure activity. 4. Hyponatremia. 5. Status post metabolic acidosis. 6. Status post rhabdomyolysis. 7. Urinary tract infection, E. coli. 8. Deconditioning.  DISCHARGE MEDICATIONS:  1. Synthroid 100 mcg one tablet p.o. q.d.  2. Allegra 60 mg one tablet p.o. b.i.d.  3. Aspirin 81 mg one tablet p.o. q.d.  4. Clonazepam 1 mg one tablet p.o. t.i.d.  5. Lexapro 10 mg one table p.o. q. a.m.  6. Imodium 2 mg two tablets p.o. with onset of diarrhea, then one tablet     after each loose stool, not to exceed eight tablets in a day.  7. Lomotil two tablets p.o. q.i.d. p.r.n. diarrhea.  8. Midrin two tablets with onset of migraine, followed by one tablet per hour     until relieved, not to exceed five tablets in a 12 hour period.  9. Oxycontin 10 mg one tablet p.o. q.12h. p.r.n. pain. 10. OxyIR 5 mg one tablet p.o. q.6h. p.r.n. breakthrough pain. 11. Premarin 1.25 mg p.o. q.d. 12. Actonel 5 mg one tablet p.o. q. a.m.  HOSPITAL COURSE:  Ms. Mcnabb is a 65 year old female with a past medical history significant for severe migraines, severe anxiety and depression, and irritable bowel syndrome, presenting via EMS secondary to syncopal episode and  seizure activity witnessed by patients husband.  Secondary to respiratory failure, the patient was immediately intubated during transportation to [facility].  Upon presentation, patient was found to be hyponatremic with a sodium of 125.  The patient remained on mechanical ventilation for approximately 30 hours of her admission.  She was weaned from the ventilator without any complications.  Patient received IV fluids with sodium bicarbonate.  The patients sodium was corrected during her admission.  The patient was noted to have significant altered mental status after extubation.  The patient was alert and oriented only to person and year during admission.  Also, during the admission the patient became tachycardic in the 130s and hypertensive to 210/110.  Patient became catatonic with decreased verbalization and decreased movements.  Noted tremor was appreciated. Psychiatry was consulted and determined patient to have altered mental status/delirium secondary to serotonin syndrome.  The patient was provided supportive care and all serotonin altering medications were withdrawn.  Cardiology was also consulted during admission secondary to elevated cardiac enzymes.  The patient had a CK up to 9000, with elevated troponins. Echocardiogram was performed without evidence of any acute abnormalities.  Per recommendation of cardiology, they deemed a cardiac catheterization necessary, however, did not want to perform catheterization secondary to altered mental status.  Patient was monitored for the last days of her hospitalization with improvement in her mental status.  Patient received physical therapy and occupational therapy for assistance with ambulation  secondary to deconditioning.  Patient was discharged from the hospital to receive PT and OT for the upcoming one to three weeks.  The patient will have close followup with her primary care physicians.  PROBLEM LIST DURING  HOSPITALIZATION:  #1 - RESPIRATORY FAILURE:  The patient came in in respiratory distress. Mostly likely secondary to serotonin syndrome and/or seizure activity.  The patient was intubated for approximately 30 hours.  The patient was extubated without difficulty.  Respiratory status remained stable throughout admission.  #2 - ALTERED MENTAL STATUS:  Secondary to seizure activity and serotonin syndrome.  Upon day of discharge, the patient was alert and oriented x 4. Mental status was intact, other than short-term memory.  EEG was performed during her admission which revealed mild generalized slowing in an unreactive background, raising the possibility of a ______ versus an encephalopathy, which could be related to a variety of toxic or metabolic etiologies. Clinical correlation is recommended.  Per psychiatry, such EEG findings are consistent with serotonin syndrome.  Recommends followup EEG in three to four weeks from discharge.  Recommend patient follow up with her neurologist. Patient will receive occupational therapy.  We are currently holding her Topamax secondary to short-term memory loss.  #3 - SEIZURE ACTIVITY:  Patients husband witnessed one tonic-clonic seizure at home.  There was no seizure activity during her admission.  Please note above EEG findings.  There was no seizure activity appreciated with EEG.  No other abnormalities found on EEG.  Most likely secondary to hypoxia secondary to respiratory depression.  No neurological deficits appreciated.  #4 - SEROTONIN SYNDROME:  Patient presented on Elavil, Prozac, Remeron, Soma, Risperdal, and Maxzide therapy.  The patients symptoms upon presentation that were consistent with serotonin syndrome included the following:  Respiratory  depression, tachycardia, hypertension, altered cognition and behavior, tremor, muscle contraction of upper extremities, metabolic acidosis, rhabdomyolysis, impaired respiratory function, elevated  temperatures.  Patient was treated supportively.  The patient received benzodiazepines for tremor.  All serotonin altering medications were withdrawn.  Patient was started back upon discharge on Lexapro therapy.  We highly recommend at this time that any migraine medications not be serotonin altering drugs.  #5 - HYPONATREMIA:  Patient presented with sodium of 125.  Patient was initiated on normal saline with sodium bicarbonate.  Patients sodium gradually corrected during her admission.  Hyponatremia was potentially secondary to Prozac therapy.  There were no abnormalities of the patients sodium once normalized.  #6 - NARCOTIC DEPENDENCE:  The patient came in narcotic dependent secondary to a long history of migraines.  Oxycontin was continued throughout her admission to prevent any narcotic withdrawal.  She was initiated on Oxycontin 10 mg q.12h.  The patient was discharged with this dosage.  Potentially, the patient may have taken additional Oxycontin before presentation which could have contributed to her respiratory failure.  Also, discontinued patient on OxyIR for breakthrough pain.  #7 - BENZODIAZEPINE DEPENDENCE:  Discharged the patient on Clonazepam 1 mg tid Recommended the family continue the Clonazepam.  We will defer to Dr. Katrinka Blazing, the patients psychiatrist for tapering of benzodiazepines if he deems necessary.  The patient does have a history of anxiety disorder and may benefit from long-term benzodiazepine use.  It is not felt at this time that patients seizure activity was secondary to benzodiazepine withdrawal.  #8 - ELEVATED CARDIAC ENZYMES WITH EKG CHANGES:  Patient does have a history of chest pain and shortness of breath.  Cardiology was consulted.  On presentation, the patient did have elevated  enzymes.  EKG revealed on presentation sinus tachycardia with occasional PVCs.  There was a left anterior fascicular block and T-wave inversions in V1 through V5 which  were new from EKG of January 2002.  The patient also presented with a prolonged QT. Echocardiogram was performed during admission which revealed the following: Normal left ventricular systolic function.  Left ventricular ejection fraction was 55-65%.  Left ventricular wall thickness was mildly increased.  Mild aortic valvular regurgitation.  Mild aortic root dilatation.  Left atrium mildly dilated.  Cardiology recommended cardiac catheterization, however, deferred procedure until mental status improved.  The patient remained on telemetry throughout admission and had no abnormalities.  EKG remained stable throughout admission.  Patient will follow up with [facility] in followup.  The patient was discharged on aspirin 81 mg.  Medicine team discussed with family that we thought patient should follow up in approximately one month for determination if cardiac catheterization was necessary at that time.  #9 - POLYPHARMACY:  The patient was admitted with numerous medications. Patients medication list was decreased significantly throughout her admission.  The following medications were stopped and recommended no further use upon discharge:  ______ , Elavil, Remeron, Zyrtec, ______ , Prozac, Topamax, Lasix, Risperdal, hyoscyamine, Soma, and Maxzide.  Patient and family were in agreement with this.  #10 - METABOLIC ACIDOSIS:  Patient presented with a metabolic acidosis with a bicarbonate of 19.  Patient received bicarbonate therapy throughout her admission and metabolic acidosis was corrected.  #11 - MIGRAINES:  The patient has a significant history of migraines with difficulty with appropriate therapy.  The patient has been using Oxycontin extensively for treatment of migraines.  Of note, the patient and family report that Topamax has significantly decreased the frequency of headaches. We held her Topamax at this time secondary to altered mental status.  Patient does report difficulties  prior to presentation with short-term memory.  We will refer the decision to initiate the Topamax to patients neurologist. However, I do recommend that patients mental status be allowed to improve for approximately the next month or two before initiation of Topamax again. Patient was discharged on Midrin, which effectively treated migraines during admission.  Also, discharged patient on Oxycontin 10 mg and OxyIR 5 mg q.6h.  #12 - IRRITABLE BOWEL SYNDROME:  The patient came in on multiple therapy for her irritable bowel syndrome.  The patient presented with ______ , Lomotil, hyoscyamine.  Possibility of anticholinergic affects with therapy.  Patient was discharged on Lomotil and Imodium for her irritable bowel syndrome. Please use caution in adding anticholinergics.  #13- ORTHOSTATIC SYNCOPE WITH HYPOTENSION:  The patient has been on Lasix therapy in the past extensively.  She has a longstanding history of orthostatic hypotension.  At this time, recommend patient stop the Lasix therapy.  Her blood pressures remained in the 130s/80s throughout her admission without the therapy.  I really feel that the Lasix has been contributing to her hypotension.  Patient is in agreement with plan.  #14 - DEPRESSION/ANXIETY:  Per recommendation of psychiatry, specifically Dr. Jeanie Sewer who discussed case with patients psychiatrist, Dr. Katrinka Blazing, the patient was discharged on Lexapro and Clonazepam for her psychiatry disorders. We will hold Prozac, Risperdal, Remeron, and Elavil.  Patient is to follow up with Dr. Katrinka Blazing in the upcoming week.  #15 - RHABDOMYOLYSIS:  The patient was admitted with a CK of 9000.  Most likely secondary to serotonin syndrome, seizure activity, and must rule out a cardiac source.  Patient did have elevated troponins on  admission so cardiology was consulted.  There were EKG changes noted.  Patient received sodium bicarbonate and IV fluids to facilitate the clearance of  myoglobin. Patients renal function remained stable without any problems.   #16 - ANEMIA:  The patient presented with a normocytic anemia.  The patient was hemoccult negative throughout admission.  Patient had a normal vitamin B12 level, normal folate level, and normal ferritin of 34.  Reticulocyte count was 1.8.  Patient with a history of peptic ulcer disease, however, no melena reported.  Discharge hemoglobin was 11.2.  Discharge hematocrit was 33.2.  MCV was 85.  We will refer further workup of anemia to primary care physician, Dr. Fayrene Fearing.  #17 - E. COLI URINARY TRACT INFECTION:  Patient was treated with Bactrim for three days.  #18 - HYPOKALEMIA:  Patients potassium was repleted throughout admission. The patient had a normal magnesium level.  #19 - ELEVATED LIVER FUNCTION TESTS:  The patient presented with elevated liver function tests that corrected during her admission.  SGOT on presentation was 161, SGPT was 85.  Recommend no Tylenol therapy for this patient who most likely was taking an excess of Tylenol secondary to migraines and chronic pain.  No history of extensive alcohol abuse.  #20 - SUPERFICIAL SACRAL SKIN BREAKDOWN/ULCER:  Patient did develop a superficial sacral ulcer during admission.  She received skin therapy during her admission.  Physical therapy and occupational therapy will keep an eye on this in the upcoming one to two weeks.  #21 - DECONDITIONING:  The patient was significantly deconditioned secondary to her prolonged hospitalization.  Patient will receive physical therapy and occupational therapy for the upcoming one to three weeks.  The patient was discharged with a rolling walker and a bedside commode.  #22 - PEPTIC ULCER DISEASE:  Status post vagotomy and antrectomy.  The patient was discharged with aspirin 81 mg q.d., recommended by cardiology.  I will refer to patients gastroenterologist in reference to whether this should be continued or not.   Recommend patient continuing until following up with cardiology.  #23 - STATUS POST HYSTERECTOMY.  #24 - HYPOTHYROIDISM:  The patient was continued on her Synthroid therapy throughout admission.  Her TSH was within normal limits and was not contributing to any of her abnormalities.  TSH was 0.880 and free T4 was 1.7.  DISCHARGE LABORATORY DATA:  White blood cells 7.1, hemoglobin 11.2, hematocrit 33.2, platelets 320, MCV 85.6, sodium 136, potassium 3.9, chloride 107, CO2 25, BUN 7, creatinine 0.6, blood glucose 93, calcium 8.5, free T4 1.7, TSH 0.880.  Urine culture positive E. coli.  Total bilirubin 0.6, alkaline phosphatase 55, SGOT 27, SGPT 40, total protein 4.5, albumin 2.4, calcium 8.0.  EEG revealed mild generalized slowing in an unreactive background, consistent with ______ versus encephalopathy which could be related to a variety of toxic or metabolic etiologies.  Chest x-ray within normal limits with a questionable left pleural effusion.  Folate 666, PTT 27, PT 12.7, INR 0.9, cholesterol 169, triglycerides 93, HDL 74, LDL 76, vitamin B12 normal at 494, reticulocyte count 1.8%, ferritin normal at 34.  Magnesium 2.0.  CK were as follows 7656, 9714, 7265, 2407.  MBs were 31.5, 57, 66.9, 3.7. Troponins were 0.3, 0.24, 0.12, 0.03.  Urine sodium of 11.  Urine osmolality 413.  Urine drug screen positive for opiates, benzodiazepines, amphetamines, and tricyclics.  Salicylates were less than 4.0.  Acetaminophen was 3.7.  DISCHARGE INSTRUCTIONS:  ACTIVITY:  Recommend patient walk with a assistance and with walker for  the upcoming several days.  Patient will receive physical therapy and occupational therapy for the next three weeks.  DIET:  No restrictions.  WOUND CARE:  Recommend patient change positions of sitting every one to two hours to facilitate healing of sacral wound.  Recommend patient keep area clean and dry.  SPECIAL INSTRUCTIONS:  As mentioned above, physical  therapy and occupational therapy to assist patient for the next three weeks.  FOLLOWUP:  The patient is to have a followup appointment with Dr. Elna Breslow, her psychiatrist, on Tuesday, July 23, 2001, at 1:30.  The patient has a followup appointment with her primary care physician, Dr. Andres Ege, on Thursday, July 25, 2001, at 10:15.  The patient is recommended to call [facility] for a followup appointment at [phone]. Dictated by:   Emelda Fear, M.D. Attending Physician:  Willow Ora DD:  07/20/01 TD:  07/20/01 Job: [id number]

## 2011-01-20 NOTE — Discharge Summary (Signed)
**Note [name]** [name], [name]                 ACCOUNT NO.:  [id number]   MEDICAL RECORD NO.:  [id number]          PATIENT TYPE:  INP   LOCATION:  5527                         FACILITY:  [facility]   PHYSICIAN:  Madeleine B. Vanstory, M.D.DATE OF BIRTH:  1945/12/20   DATE OF ADMISSION:  09/11/2004  DATE OF DISCHARGE:  09/29/2004                                 DISCHARGE SUMMARY   DISCHARGE DIAGNOSES:  1.  Acute renal failure secondary to rhabdomyolysis.  2.  Migraine headaches.  3.  Irritable bowel.  4.  Depression.  5.  Hypothyroidism.  6.  Peptic ulcer disease.  7.  Seizure disorder.  8.  Short-term memory deficit.  9.  History of overdose x 2 in 2005.  10. New onset hypertension.   DISCHARGE MEDICATIONS:  1.  Norvasc 5 mg p.o. daily.  2.  Calcium 500 mg p.o. t.i.d.  3.  Lexapro 10 mg p.o. daily.  4.  Synthroid 100 mcg p.o. daily.  5.  Multivitamin p.o. daily.  6.  Protonix 40 mg p.o. b.i.d. a.c.  7.  Aspirin 81 mg p.o. daily.  8.  Seroquel 200 mg p.o. daily.  9.  Lamictal 100 mg p.o. q.a.m. at lunch and 200 mg q.h.s.   The patient previously on OxyContin for headaches.  The patient to follow up  with migraine headache doctor to address hospital medications for migraines  that are non-opioids.   HOSPITAL COURSE:  [name] who was found down for an  unknown period of time after taking large amounts of OxyContin.  The  patient's husband dispenses medications as patient has short-term memory  deficit.  The patient must have found hidden medications and accidentally  overdosed on it.  Problem 1. Acute renal failure secondary to  rhabdomyolysis.  The patient was oliguric and creatinine continued to climb  from a baseline at admission of 2.0 to 8.5.  Renal was consulted.  The  patient received two episodes of hemodialysis and had his Lasix at 160  b.i.d.  The patient's urine output increased and creatinine continued to  trend down.  Discharge creatinine pending, will be  added in an addendum.  Renal signed off as renal function returned.   Problem 2. Hypertension.  The patient previously hypotensive per husband.  The patient was hypertensive in the 160s to 170s/90s to 100s.  The patient  was started on Norvasc, titrated up to 10 mg p.o. daily and then brought  down to 5 mg p.o. daily .  This may be discontinued as an outpatient if  pressures return to normal.   Problem 3. Psychiatric.  The patient has a psychiatric history.  Psychiatric  consult by Dr. Jeanie Sewer was ordered who recommended Lexapro and possible  outpatient workup to assess short-term memory.  The patient on Lexapro and  can continue Seroquel as an outpatient and follow-up with her psychiatrist  as an outpatient.   Problem 4. Neurologic.  Initially, patient presented with some neurological  deficit likely secondary to uremia.  Per husband, patient back at baseline  though still with occasional struggling for words patient  used to be  familiar with.  The patient has an underlying seizure disorder, stable, but  short-term memory deficits secondary to that.   Problem 5. Patient safety.  I spoke at length with the husband regarding  patient's safety and need to dispose of opioids in the house.  Husband  remarks that they have tried several medications for migraine and the  opioids were the only thing that worked.  He recognizes he cannot leave the  patient alone in the house with opioids without them either being locked up  or him taking the medications with him.  He also remarks that the doctor who  handles her migraines is wanting to try new medications, to do so, and we  advised husband to be certain she cannot overdose on opioids again.   Problem 6. Physical therapy.  The patient received physical therapy as an  inpatient.  They reported her not wanting to participate.  They advised  patient would benefit from a couple days in [place] but no [facility] beds available.  The patient to receive  home health PT.   Problem 7. Gastrointestinal.  The patient did develop diarrhea during her  stay.  The patient has a history of IBS which usually responds to Imodium.  The patient's stools were sent for C. difficile and the patient treated with  Imodium.   DISCHARGE CONDITION:  Fair.   DISCHARGE FOLLOW-UP:  Billey Gosling, M.D., and with her psychiatrist.      MBV/MEDQ  D:  09/28/2004  T:  09/28/2004  Job:  [id number]   cc:   Billey Gosling, M.D.  [address] [place], [place] [address]  Fax: [phone]

## 2011-01-20 NOTE — Op Note (Signed)
NAMEMARIELENA, [name]                 ACCOUNT NO.:  [id number]   MEDICAL RECORD NO.:  [id number]          PATIENT TYPE:  AMB   LOCATION:  ENDO                         FACILITY:  [facility]   PHYSICIAN:  Anselmo Rod, M.D.  DATE OF BIRTH:  [birth year]   DATE OF PROCEDURE:  11/19/2006  DATE OF DISCHARGE:                               OPERATIVE REPORT   PROCEDURE PERFORMED:  Colonoscopy of the mid right colon.   ENDOSCOPIST:  Anselmo Rod, M.D.   INSTRUMENT USED:  Pentax video colonoscope.   INDICATIONS FOR PROCEDURE:  65 year old white female with a 10-day  history of rectal bleeding undergoing colonoscopy to rule out colonic  polyps, masses, etc.   PREPROCEDURE PREPARATION:  Informed consent was procured from the  patient. The patient fasted for 8 hours prior to the procedure and  prepped with a gallon of Nulytely the night prior to procedure. The  risks and benefits of the procedure including a 10% miss rate of cancer  and polyp were discussed with the patient as well.   PREPROCEDURE PHYSICAL:  The patient had stable vital signs.  Neck  supple.  Chest clear to auscultation.  S1, S2 regular.  Abdomen soft  with normal bowel sounds.   DESCRIPTION OF PROCEDURE:  The patient was placed in left lateral  decubitus position and sedated with 100 mcg of Fentanyl and 10 mg of  Versed given intravenously in slow incremental doses.  Once the patient  was adequately sedated and maintained on low-flow oxygen and continuous  cardiac monitoring, the Pentax video colonoscope was advanced from the  rectum to the mid right colon with difficulty. There was a large amount  of debris in the colon. Although this was liquid stool, suction was not  possible. There was a large amount of solid debris present as well.  Significant ischemic changes were noted from 60-80 cm and biopsies were  done to confirm the diagnosis. The procedure was aborted in the mid  right colon. Plan are to reprep the patient redo  procedure at a later  date.   IMPRESSION:  1. Large amount of stool in the colon, incomplete visualization.      Procedure aborted in mid right colon.  2. Ischemic changes noted from 60-80 cm with a large patchy      ulceration.  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   RECOMMENDATIONS:  1. Await pathology results.  2. Avoid all nonsteroidals for now.  3. The patient has been advised to discontinue Midrin, meloxicam,      Reglan and aspirin.  4. Outpatient follow-up in the next 2 weeks.  Further recommendations      made in follow-up.      Anselmo Rod, M.D.  Electronically Signed     JNM/MEDQ  D:  11/19/2006  T:  11/20/2006  Job:  [id number]   cc:   Sibyl Parr. Darrick Penna, M.D.

## 2011-01-20 NOTE — Op Note (Signed)
[name], FIX                           ACCOUNT NO.:  [id number]   MEDICAL RECORD NO.:  [id number]                   PATIENT TYPE:  INP   LOCATION:                                       FACILITY:  [facility]   PHYSICIAN:  Adolph Pollack, M.D.            DATE OF BIRTH:  04/10/1946   DATE OF PROCEDURE:  01/20/2003  DATE OF DISCHARGE:                                 OPERATIVE REPORT   PREOPERATIVE DIAGNOSIS:  Small bowel obstruction.   POSTOPERATIVE DIAGNOSES:  Same with omental incision hernia.   PROCEDURE:  1. Exploratory laparotomy.  2. Lysis of adhesions.  3. Ventral hernia repair incision.   SURGEON:  Avel Peace, M.D.   ASSISTANT:  Danna Hefty, M.D.   ANESTHESIA:  General.   FINDINGS:  In the right lower quadrant area there is adhesive band leading  to a near complete small bowel obstruction.  There is inspissated very firm  stool in the right colon and transverse colon, but softer stool at the  splenic flexure.   INDICATIONS FOR PROCEDURE:  [name] who presented with  abdominal pain, Jan 14, 2003, and was felt to be potentially partial small  bowel obstruction as well as UTI, versus ileus. Along with UTI.  She also on  a number of medications including chronic narcotics that could contribute to  the ileus. She slowly improved, however, over the past day or two she has  failed to progress and has had increasing pain and axillary evidence  of  persistent small bowel obstruction and now is brought to the operating room.   The procedure and the risks were explained to her and her husband  preoperatively.   PROCEDURE:  The patient was brought to the operating room and placed supine  on the operating room table. General anesthetic was administered.  A central  line was placed by Dr. Judie Petit of anesthesia.  The abdomen was then  sterilely prepped and draped. I could palpate a supraumbilical fascia defect  in the area of a  previous upper midline incision.  Limited midline incision  was made beginning above the umbilicus and extending below it, incising the  skin and the subcutaneous tissue.  I entered the hernia sac, allowing me to  enter the peritoneal cavity.  I then opened the hernia sac the rest of the  way and then divided the fascia and entered the peritoneal cavity for the  length of the incision.  Dilated loops of bowel were noted. There were some  omental adhesions of the anterior abdominal wall which were taken down with  cautery.   I then placed my hand down the right lower quadrant and could feel the area  of obstruction. I exposed this area and performed sharp lysis of adhesions  and there was one single band that was causing near complete obstruction.  Upon lysing this the obstruction  was relieved. I then ran the small bowel  from the ligament of Treitz down to the ileocecal valve and __________  their obstructive points.  I was able to pass material through the previous  obstructed area and it was widely patent. I did notice some very hard  inspissated stool in the right colon all the way to the mid transverse  colon. Following this it became softer and decompressed.   I took omental adhesions down from the hernia sac and found adequate fascia.  I then confirmed needle, sponge and instrument counts.  The abdomen was then  closed with a running #1 PDS suture, taking large bites.  Subcutaneous  tissue was irrigated and skin closed with staples.   She tolerated the procedure well without any apparent complications.  She is  taken to the recovery room in satisfactory condition.                                                 Adolph Pollack, M.D.    Kari Baars  D:  01/20/2003  T:  01/21/2003  Job:  [id number]   cc:   Gershon Crane, M.D.   Corwin Levins, M.D. [facility]   Christene Slates. Katrinka Blazing, M.D.  [address]  [place]  [place] [address]  Fax: [phone]   Doylene Canning. Ladona Ridgel, M.D.

## 2011-01-20 NOTE — Discharge Summary (Signed)
[name], [name]                           ACCOUNT NO.:  [id number]   MEDICAL RECORD NO.:  [id number]                   PATIENT TYPE:  INP   LOCATION:  4741                                 FACILITY:  [facility]   PHYSICIAN:  Wayne A. Sheffield [name], M.D.                 DATE OF BIRTH:  1946-05-25   DATE OF ADMISSION:  10/17/2003  DATE OF DISCHARGE:  10/17/2003                                 DISCHARGE SUMMARY   DISCHARGE DIAGNOSES:  1. Unintentional OxyContin overdose.  2. Migraine with history of intractable migraine.  3. History of opioid abuse.  4. History of iron-deficiency anemia.  5. Irritable bowel syndrome.  6. History of depression.  7. Hypothyroidism.  8. History of panic attacks.   PROCEDURES:  None.   CONSULTATIONS:  None.   HISTORY OF PRESENT ILLNESS:  The patient is a 65 year old Caucasian female  who apparently took 2 OxyContin at bedtime for a headache, then due to her  short-term memory loss, forgot that she had taken those OxyContin and  accidentally took two more.  The husband was unaware that she has  accidentally taken 4 OxyContin very near to each other.  Later in the night,  he noticed severely decreased respiration rate and was unable to arouse her  to her normal level of consciousness and then brought her to the hospital.   PHYSICAL EXAMINATION:  VITAL SIGNS: Temperature 98, heart rate 83 to 96,  respirations 16 to 20, blood pressure 69 to 93/31 to 56.  Oxygen saturation  was 97% on 4 liters at admission.  GENERAL:  The patient was awake and alert at time of admission exam.  Her  insight was appropriate.  The rest of her physical exam was noncontributory.   HOSPITAL COURSE:  UNINTENTIONAL OXYCONTIN OVERDOSE:  The patient was admitted and placed on  cardiac monitoring.  Her normal level of consciousness had resumed, and the  patient was awake and alert and appropriate at time of discharge. She was  able to eat without complication.  She required no intervention  for the  unintentional overdose.   The patient's husband returned to the hospital later in the afternoon and  was comfortable with taking her home.  No changes in the patient's  medications were made during her time in the hospital.   DISCHARGE MEDICATIONS:  1. Colestyramine 4 g twice a day.  2. Iron sulfate 325 mg once a day.  3. Flonase as needed.  4. Florinef 0.1 mg every day.  5. Frova 2.5 mg p.r.n. headache.  6. Klonopin 1 mg b.i.d.  7. Lamictal 100 mg q.a.m. and 150 mg q.h.s.  8. Lexapro 10 mg p.o. daily.  9. Magnesium 200 mg b.i.d.  10.      Midrin as needed for migraines.  11.      Mobic 7.5 mg b.i.d.  12.      Os-Cal 500 plus D  every day.  13.      OxyContin 40 mg as needed for headache.  14.      Protonix 40 mg b.i.d.  15.      Reglan 10 mg p.r.n. headache.  16.      Seroquel 600 mg q.h.s.  17.      Synthroid 100 mcg.  18.      Zanaflex 2 mg in the morning and 4 mg at night.   DISCHARGE DESTINATION:  The patient was discharged home to the care of her  husband.   DISCHARGE INSTRUCTIONS:  The patient was instructed to monitor her  medications carefully and to return of any other medication errors were made  or if the patient had difficulty breathing.   FOLLOW UP:  The patient is to keep all of her regularly followup  appointments.      Franchot Mimes, MD                         [name], M.D.    TV/MEDQ  D:  10/17/2003  T:  10/17/2003  Job:  [id number]   cc:   Billey Gosling, M.D.  [address] [place], [place] [address]  Fax: [phone]   [facility]

## 2011-01-20 NOTE — Discharge Summary (Signed)
NAMEDRAYA, FELKER                           ACCOUNT NO.:  [id number]   MEDICAL RECORD NO.:  [id number]                   PATIENT TYPE:  INP   LOCATION:  4729                                 FACILITY:  [facility]   PHYSICIAN:  Asencion Partridge, M.D.                  DATE OF BIRTH:  18-Aug-1946   DATE OF ADMISSION:  05/18/2004  DATE OF DISCHARGE:  05/19/2004                                 DISCHARGE SUMMARY   ADDENDUM:   HOSPITAL COURSE:  The patient's chemistry panel was within normal limits,  sodium 138, potassium 4.1, chloride 104, CO2 22, glucose 128, BUN 10,  creatinine 0.9.  Troponins were normal at 0.01 x2.  Cortisol level 2.7.  WBC  6.0, hemoglobin 13.5, hematocrit 39.3, MCV 94.2, platelets 174,000.      Bonnita Hollow, M.D.               Asencion Partridge, M.D.    VRE/MEDQ  D:  05/19/2004  T:  05/20/2004  Job:  [id number]

## 2011-01-20 NOTE — Discharge Summary (Signed)
NAMEFERNE, [name]                 ACCOUNT NO.:  [id number]   MEDICAL RECORD NO.:  [id number]          PATIENT TYPE:  INP   LOCATION:  1506                         [facility]ACILITY:  [facility]   PHYSICIAN:  Marlowe Kays, M.D.  DATE OF BIRTH:  03/15/1946   DATE OF ADMISSION:  06/30/2006  DATE OF DISCHARGE:  07/05/2006                                 DISCHARGE SUMMARY   ADMISSION DIAGNOSES:  1. Closed displaced distal right tibial fracture.  2. History of depression.  3. History of irritable bowel syndrome.  4. Hypothyroidism.  5. Migraine headaches.  6. Obesity.  7. History of peptic ulcer disease with partial gastrectomy.   DISCHARGE DIAGNOSES:  1. Closed displaced distal right tibial fracture.  2. History of depression.  3. History of irritable bowel syndrome.  4. Hypothyroidism.  5. Migraine headaches.  6. Obesity.  7. History of peptic ulcer disease with partial gastrectomy.  8. Postoperative anemia, treated with transfusion.  9. Mild hypertension (transient).   CONSULTS:  None.   OPERATION:  1. On June 30, 2006, the patient underwent attempted closed      manipulation, right distal tibial fracture.  2. Open reduction/internal fixation with nine hole medial malleolar blood      pressure plate, Jamelle Rushing, PA-C, assisted.   BRIEF HISTORY:  This 65 year old female was asleep in her home, and when the  doorbell range, she got up.  She was somewhat sleepy.  She twisted her ankle  and fell.  She had immediate pain and was unable to weight bear on her right  lower extremity.  She was brought to the emergency room, where a spiral  displaced fracture of the distal tibia was seen.  This is an active lady.  She has many medical problems; however, she does maintain a fairly active  life.  After much discussion with her and her husband concerning the risks  and benefits of the surgery, it is felt she would benefit from surgical  intervention and was admitted for the above  procedure.   COURSE IN THE HOSPITAL:  The patient tolerated the surgical procedure quite  well.  She was very slow in her level of activity with physical therapy.  We, of course, had to have nonweightbearing on the operative extremity so as  not to displace or displace the fracture or threaten the fixation that was  done intraoperatively.  We treated her anemia with 2 units of PRBCs,  bringing her hemoglobin from 7.2 up to 10.2, and final hemoglobin was 11  with a hematocrit of 33.2.   As we weaned her from the PCA analgesics, the patient had a mild elevation  in her blood pressure, presumably secondary to her increasing pain.  Her  blood pressure went up to 161/100 in the morning, and at the time of this  discharge summary, it was 142/85.  Her temperature was 99.3.  We will  encourage incentive spirometer for lung aeration.   On the day of discharge and throughout her hospitalization, neurovascular  remained intact with good capillary refill to the toes of the right lower  extremity.  The postoperative cast that was placed for stabilization was  clean and dry.  This was rewrapped and taped over with new Ace bandages  prior to planned discharge.   It was hoped that the patient would be placed in a continuing rehabilitation  program inpatient due to her slow progress.  A rehabilitation consult at  [facility] rehabilitation medicine was asked for and received, and she could  go either to the [facility] facility or to a facility near her home.  Apparently,  she and her family have requested a skilled nursing facility with a rehab  program near her home.  Whichever is available, and of course, we would  agree with continuing rehabilitation.   On the morning of discharge, the patient was awake, alert and oriented.  She  had achieved transfer to bed-chair, bedside commode, and short ambulation  activities with a moderate amount of assist.  We did allow her to touchdown  very lightly with only  5-10% of her body weight on the operative extremity  so that she would not have to hop when ambulating.  She will be fitted  with a rolling walker.   The laboratory values in the hospital hematologically showed a preoperative  CBC with an anemia.  The RBC was 3.4.  Hemoglobin was 8.4, hematocrit 25.4.  Final hemoglobin and hematocrit showed a hemoglobin of 11, hematocrit 33.2,  white cell count was never was elevated, 6.7 was the white cell count at  discharge.  Platelets were 213.  Blood chemistries remained essentially  normal throughout her hospitalization.  Glucose was slightly elevated with  the final being 87, which was normal.   No chest x-ray seen on this chart.  No electrocardiogram.   CONDITION ON DISCHARGE:  Improved, stable.   PLAN:  Patient is discharged to skilled nursing or [facility] rehabilitation to  continue with her post ORIF right distal tibial rehabilitation.  As far as  therapy is concerned, she is to maintain 5-10% touchdown weightbearing on  the right lower extremity, utilizing a walker.  Follow up with Dr. Simonne Come  in two weeks after the date of surgery.  We would like to have the right  foot/leg elevated on at least two pillows for proper drainage and to prevent  edema into the lower extremity.  This, of course, would interfere with the  healing process.   Medications at the time of discharge that are taken in the hospital are  multivitamin, therapeutic vitamin, 1 daily; Cymbalta  60 mg cap daily;  Klonopin 1 mg tab b.i.d.; Seroquel 100 mg tab nightly; Rozarem 8 mg tab  nightly; Lyrica 50 mg cap t.i.d.; Levothyroxine 100 mcg tab daily; Protonix  40 mg EC tab daily; Mobic 7.5 mg tab b.i.d.; Zanaflex 4 mg tab at 10:00 a.m.  and 4:00 p.m.; Zanaflex 4 mg tab nightly; aspirin 81 mg tab daily; Robaxin  500 mg 1 q.6h. p.r.n. muscle spasm; Midrin cap 1-2 caps p.r.n. headache;  Dilaudid 2 mg tab q.6h. p.r.n.  Should you have any questions concerning her various medical  problems, need  to contact her specific physician.  Her general medical doctor is Dr.  Milagros Evener.  Also, Dr. Gershon Crane, Dr. Carolanne Grumbling.  Her primary  family physicians, presumably including those above, is the [facility] [place].   Should you have any orthopedic problems, you are welcome to call Dr.  Leim Fabry office at [phone].      Dooley L. Underwood, P.A.  ______________________________  Marlowe Kays, M.D.    DLU/MEDQ  D:  07/05/2006  T:  07/05/2006  Job:  [id number]   cc:   Milagros Evener, M.D.  Fax: [phone]   Marc Morgans. Mayford Knife, M.D.   Gershon Crane, M.D.

## 2011-01-20 NOTE — Op Note (Signed)
[name], [name]                           ACCOUNT NO.:  [id number]   MEDICAL RECORD NO.:  [id number]                   PATIENT TYPE:  OIB   LOCATION:  5727                                 FACILITY:  [facility]   PHYSICIAN:  Adolph Pollack, M.D.            DATE OF BIRTH:  Nov 17, 1945   DATE OF PROCEDURE:  09/22/2003  DATE OF DISCHARGE:                                 OPERATIVE REPORT   PREOPERATIVE DIAGNOSIS:  Ventral incisional hernia.   POSTOPERATIVE DIAGNOSIS:  Ventral incisional hernia measuring 14 cm by 24  cm.   PROCEDURE:  Laparoscopic ventral incisional hernia repair with polypropylene  mesh (with nonadhesive barrier on one side).   SURGEON:  Adolph Pollack, M.D.   ASSISTANT:  Sandria Bales. Ezzard Standing, M.D.   ANESTHESIA:  General.   INDICATIONS FOR PROCEDURE:  Ms. Kates is a 65 year old female who has had  multiple abdominal operations.  The last was an urgent exploratory  laparotomy for small bowel obstruction in the spring of 2004.  She developed  a progressively increasing bulge and on examination in the office, has a  ventral incisional hernia which was starting to become symptomatic with  respect to pain and some nausea.  After a long discussion and going over the  procedure and the risks, she has elected to proceed with elective  laparoscopic ventral incisional hernia repair.   SURGICAL TECHNIQUE:  She was seen in the holding area and then brought to  the operating room and placed supine on the operating table and a general  anesthetic was administered.  A Foley catheter was placed in her bladder.  Orogastric tube was placed.  The abdominal wall was then sterilely prepped  and draped.  In the right upper quadrant, an incision was made through the  skin and subcutaneous tissue and each layer of fascia was identified and  incised.  The peritoneal cavity was entered bluntly under direct vision.  A  Hasson trocar was then inserted into the peritoneal cavity and  pneumoperitoneum was created by insufflation of CO2 gas.  The laparoscope  was then introduced and the area underneath the trocar was examined and  there was no evidence of bowel injury or bleeding.  Next, a 5 mm trocar was  placed in the right lower quadrant and one also in the left lower quadrant.  I visualized the area of the hernia and there was omentum up in the hernia,  no evidence of bowel in the hernia.  Using sharp and blunt dissection, I was  able to reduce most of the omentum from the hernia.  There was one large  hernia in the epigastric region and then there is a small band of fascia in  the periumbilical hernia and superior to the larger hernia, there is also a  number of smaller other herniae in a swiss-cheese type affect.  I went ahead  and divided the falciform ligament.  After I cleared all the omentum out of  the hernia I made sure I had good strong fascia around all the defects.  I  then used spinal needles to trace the periphery of the defect and mark it.  I measured approximately 3-4 cm away from each of the spinal needles into a  large oval.  I brought a piece of 20 cm by 30 cm polypropylene mesh onto the  field as the defect measured approximately 14-15 cm by 24 cm.  I then placed  eight anchoring sutures of 0 Novafil into the mesh one side of which had a  nonadhesive barrier to it.  This was rolled up and then placed into the  abdominal cavity and unrolled with the  nonadhesive barrier facing the  omentum and viscera.  In the periphery, I made eight small stab incisions.  Each of the anchoring sutures were then grasped through the small stab  incisions and brought up around the fascial bridge and these were all pulled  up partially anchoring the mesh to the anterior abdominal wall.  These  sutures were tied down and cut.  Next, using the spiral tacker, I ran tacks  around both around the periphery of the mesh and an internal was placed to  further anchor the mesh.   After examining this, the mesh would more than  adequately cover the defect and was well anchored.  I then inspected the  area.  There was a little bit of bleeding from a small avulsion of liver  capsule and I placed a piece of Surgicel over this and this became  hemostatic.  A small amount of blood was evacuated by suction.  I removed  some of the trocars and then removed the Hasson trocar and then closed the  fascial defect with interrupted 0 Vicryl sutures.  I reinsufflated the  abdomen and inspected the fascial closure of the right upper quadrant  fascial defect that was solid.  I then removed the remaining trocars and  released the pneumoperitoneum.  All the skin incisions were then closed with  4-0 Monocryl subcuticular stitches.  Steri-Strips and sterile dressings were  applied.  She tolerated the procedure well without any apparent complication  and was taken to the recovery room in satisfactory good condition.                                               Adolph Pollack, M.D.    Kari Baars  D:  09/22/2003  T:  09/22/2003  Job:  [id number]   cc:   Billey Gosling, M.D.  [address] [place], [place] [address]  Fax: [phone]

## 2011-01-20 NOTE — Op Note (Signed)
[name], [name]                 ACCOUNT NO.:  [id number]   MEDICAL RECORD NO.:  [id number]          PATIENT TYPE:  INP   LOCATION:  1506                         [facility]ACILITY:  [facility]   PHYSICIAN:  Marlowe Kays, M.D.  DATE OF BIRTH:  1946-02-05   DATE OF PROCEDURE:  06/30/2006  DATE OF DISCHARGE:                                 OPERATIVE REPORT   PREOPERATIVE DIAGNOSIS:  Closed displaced spiral fracture, distal right  tibia.   POSTOPERATIVE DIAGNOSIS:  Closed displaced spiral fracture, distal right  tibia.   OPERATION:  1. Attempted closed manipulation of right tibial fracture.  2  Open reduction and internal fixation with 9-hole medial malleolar  buttress plate.   SURGEON:  Marlowe Kays, M.D..   ASSISTANT:  Jamelle Rushing, P.A.   ANESTHESIA:  General.   INDICATIONS FOR PROCEDURE:  She fell earlier today sustaining the fracture,  which was oblique and displaced as well as closed in the distal tibia.  I  felt it was too far distally to try and do intramedullary rodding, and plan  was to try and do a closed manipulation (although the fracture was shortened  and rotated).  I was not optimistic about this and if necessary perform  ORIF.   PROCEDURE:  Satisfactory general anesthesia, a Foley catheter had already  been inserted.  Initially we did not use any prophylactic antibiotics.  With  the C-arm I attempted to perform closed manipulation, but we were not able  to get anything short of a satisfactory reduction.  Accordingly, we  proceeded with open treatment.  With pneumatic tourniquet, right leg was  Esmarched out nonsterilely; with tourniquet inflated to 350 mmHg.  The right  leg was prepped from knee to toes with DuraPrep, and then draped in a  sterile field.  I made a longitudinal incision along the medial border of  her tibia, going down over the medial malleolus with mainly blunt  dissection, in trying to preserve as many of the vascular structures as  possible.  I  carefully dissected down to the fracture site.  We removed some  periosteum that was interposed, and after freeing this up and manipulating  the leg we were able to obtain essentially anatomic reduction.  We held this  with a tenaculum clamp while I measured her for a medial malleolus buttress  plate.  We had an absent size in our stock, and a 5-hole plate was too  short.  We selected a  9-hole plate, which was a little longer than we  needed, but we did not have an ideal 7-hole plate to use.  We first bent the  plate distally to accommodate the flare of the tibia and both the medial  malleolus.  We then stabilized the plate on the tibia with the tenaculum and  first placed some preliminary screws proximal and distal to stabilize the  plate, checking the position as we went with AP and lateral C-arm x-rays.  When we were pleased with the picture, we completed all the holes,  individually drilling with a 2.5 drill and using a 4 mm  screws after  measuring.  I did not utilize the final hole superiorly, which was not  necessary, and would involve a good bit of additional dissection.  Final AP  and lateral x-rays were taken.  On lateral projection the plate proximally  was somewhat posterior on the bone, but to palpation was satisfactory.  This  was the best position that we could get for the entire fracture and plate.  The wound was then irrigated with sterile saline.  Soft tissues infiltrated  with 0.5% plain Marcaine, and the wound closed in layers with  #1 Vicryl deep, 2-0 Vicryl superficially, 3-0 nylon in the skin.  Betadine,  Adaptic dry sterile dressing with a short-leg splint cast applied.  Tourniquet was released.  She tolerated the procedure well and was taken to  the recovery room in satisfactory condition.  There were no complications.           ______________________________  Marlowe Kays, M.D.     JA/MEDQ  D:  06/30/2006  T:  07/02/2006  Job:  [id number]

## 2011-01-20 NOTE — H&P (Signed)
[name], [name]                 ACCOUNT NO.:  [id number]   MEDICAL RECORD NO.:  [id number]          PATIENT TYPE:  INP   LOCATION:  2106                         FACILITY:  [facility]   PHYSICIAN:  Melina Fiddler, MD DATE OF BIRTH:  [birth year]   DATE OF ADMISSION:  09/11/2004  DATE OF DISCHARGE:                                HISTORY & PHYSICAL   CHIEF COMPLAINT:  A 65 year old white female brought to the ER by EMS for  possible overdose.   HISTORY OF PRESENT ILLNESS:  Ms. Koran is a 65 year old white female brought  to [facility] ER by EMS.  She was accompanied by her husband.  According to  her husband, this patient has short-term memory problems, and he is the one  responsible for taking care of this patient's medications.  This patient's  husband noticed that 13 oxycodone 30 mg tablets were missing.  The patient's  husband states that Ms. Donalson on Friday, the sixth, had a colonoscopy.  Given  the treatment that the patient received, the patient soiled her mattress  during the Friday, sixth, afternoon.  The patient felt emesis and, with the  help of her husband, they flipped around the mattress, and that is when this  patient hit her head.  On Saturday, January 7, this patient woke up with a  headache.  Ms. Rawlinson requests to her husband her OxyContin for her headache.  This patient's husband gave her 1 pill, and the patient went to sleep during  the afternoon.  Later in the day around midday when the patient's husband  tried to wake Ms. Jeter up, she was not responsive.  Mr. Fenstermaker called the EMS  team.  When the patient arrived in [facility] ER, the patient was  unresponsive, breathing on her own but at really low respiratory frequency.  The patient was intubated, received Narcan 6 mg, dopamine, and flumazepil  0.2 mg.  After several attempts, the ER doctor was able to secure a femoral  line.  Family practice teaching service requests critical care management  and MD consult.   REVIEW OF SYSTEMS:  Unable to assess.  Neurologic:  Glasgow Coma Scale 5 to  6.  Eyes were open, pupils were moist, unresponsive to light.  The patient  was blinking occasionally.   PAST MEDICAL HISTORY:  1.  Panic attacks.  2.  Migraines.  3.  Opioid abuse.  4.  IBS.  5.  Depression, major, recurrent.  6.  Hypothyroidism.  7.  Arthralgia.  8.  Allergic rhinitis.  9.  TMJ syndrome.  10. History of seizures in 2002 and was hospitalized with serotonin      syndrome.  The patient has a history of memory deficit, probably      secondary to this episode in 2002.   MEDICATIONS:  This is according to the [facility].  Husband to remit for  confirmation.  1.  Aspirin 81 mg daily.  2.  Flonase p.r.n.  3.  Florinef acetate 0.1 daily.  4.  Frova 2.5 p.r.n. headache.  5.  Klonopin 1 mg b.i.d.  6.  Lamictal 100 mg q.a.m. plus 200 q.h.s.  7.  Lexapro 10 mg daily.  8.  Magnesium carbonate 200 mg b.i.d.  9.  Micrin p.r.n.  10. Mobic 7.5 mg b.i.d.  11. Multivitamins daily.  12. Os-Cal 500.  13. OxyContin 40 mg p.r.n. headaches.  14. Protonix 40 mg daily.  15. Reglan 10 mg p.r.n. headache.  16. Seroquel 500 to 600 q.h.s.  17. Synthroid 100 mcg.  18. Zanaflex 4 mg 1/2 b.i.d. plus 1.5 q.h.s.   ALLERGIES:  ERYTHROMYCIN, AZITHROMYCIN.   PROCEDURES:  1.  Hysterectomy and BSO on January 1970.  2.  Abdominal surgery to repair bleeding ulcer October 2001.  3.  Laparotomy with ventral hernia repair May 2004.  4.  Cholecystectomy January 1971.  5.  Ventral hernia repair January 2005.   FAMILY HISTORY:  Mother:  Aneurysm.  Grandmother and father:  Cancer.   SOCIAL HISTORY:  Lives with husband, has one son.  Denies tobacco or ETOH.   PHYSICAL EXAMINATION:  VITAL SIGNS:  Temperature 94.8, pulse 92, blood  pressure 50 to 60s, respirations 14 on ventilation.  The patient was  intubated.  O2 saturation 95% on ventilator.  GENERAL:  The patient was unconscious with eyes open, unresponsive.  Patient's  judgment and insight:  Unable to obtain.  HEENT:  Eyes:  Pupils moist.  Mildly responsive to light.  Ears, nose within  normal limits.  Throat:  Unable to assess since patient was intubated.  NECK:  No lymphadenopathy, no thyroid enlargement.  RESPIRATORY:  Clear to auscultation bilaterally.  The patient is intubated  on ventilator.  CARDIOVASCULAR:  Severe hypertension between 50s and 60s.  Tachycardic.  Very weak peripheral pulses.  ABDOMEN:  Soft, nondistended, obese.  EXTREMITIES:  No edema.  NEUROLOGIC:  Glasgow Coma Scale 5 to 6.   LABORATORY DATA AND OTHER STUDIES:  WBC 14.1, hemoglobin 12.8, hematocrit  38.2, platelets 157.  PT 19.1, APTT 40.  Total protein 5.7, albumin 3,  bilirubin direct 0.8, bilirubin indirect 0.9.  Sodium 133, potassium 7.3,  chloride 111, CO2 27.2, BUN 13, creatinine 2, glucose 83.  ABG showed pH  7.17, PCO2 46.3, PO2 153, bicarb 17.1.  Urine drug screen positive for  opiates, benzodiazepines, and tricyclics.  ETOH blood level 25.  Salicylate  4.1.  Urinalysis:  Nitrite positive, leukocyte esterase small.  Urine  microscopy:  White blood cells 3 to 6, bacteria many.   EKG:  Normal sinus rhythm, right axis deviation, possible right ventricular  hypertrophy.   IMPRESSION AND PLAN:  55.  A 65 year old white female with overdose.   1.  Pulmonary:  Respirator with patient secondary to opiate overdose.  The      patient intubated, followup CCM.  2.  Cardiovascular system:  Electrocardiogram changes secondary to increased      potassium.  Hypertension secondary to opiate overdose.  Start Levophed      drips 5 mcg (titrate for MAP greater than 65).  Titrate dopamine down      with Levophed to keep MAP greater than or equal to 65.  3.  Renal:  Acute renal insufficiency secondary to hypovolemia.  Creatinine      2.  4.  Endocrine system: stable.  5.  Neurologic:  The patient responds to painful stimuli, miosis, blinking     occasionally.  Glasgow Coma Scale:   5 to 6 secondary to opiate overdose.  6.  Infectious disease:  Urinary tract infection.  The patient is on Cipro  400 mg IV daily.  Urine culture and sensitivity pending.  Blood culture      pending.  7.  Fluid, electrolytes, and nutrition:  N.P.O.  IV fluids at 100 mL per      hour.  Hyperkalemia:  1 amp of D50 followed by 100 units of regular      insulin, albuterol nebulization q.6h.  8.  Endocrine system:  Hypothyroidism:  Use Synthroid 100 mcg IV daily.  9.  Disposition:  Admit to ICU for continuous cardiorespiratory monitoring,      treatment of overdose.  Appreciate critical care management follow up to      family practice teaching service.       FIM/MEDQ  D:  09/12/2004  T:  09/12/2004  Job:  [id number]

## 2011-02-12 ENCOUNTER — Other Ambulatory Visit: Payer: Self-pay | Admitting: Family Medicine

## 2011-02-13 NOTE — Telephone Encounter (Signed)
Refill request

## 2011-03-14 ENCOUNTER — Other Ambulatory Visit: Payer: Self-pay | Admitting: Family Medicine

## 2011-03-14 NOTE — Telephone Encounter (Signed)
Refill request

## 2011-03-24 ENCOUNTER — Other Ambulatory Visit: Payer: Self-pay | Admitting: Family Medicine

## 2011-03-24 NOTE — Telephone Encounter (Signed)
Refill request

## 2011-03-27 NOTE — Telephone Encounter (Signed)
Needs follow up visit

## 2011-04-29 ENCOUNTER — Other Ambulatory Visit: Payer: Self-pay | Admitting: Family Medicine

## 2011-04-29 NOTE — Telephone Encounter (Signed)
Refill request

## 2011-05-03 ENCOUNTER — Other Ambulatory Visit: Payer: Self-pay | Admitting: Family Medicine

## 2011-05-03 NOTE — Telephone Encounter (Signed)
Refill request

## 2011-05-08 ENCOUNTER — Other Ambulatory Visit: Payer: Self-pay | Admitting: Family Medicine

## 2011-05-08 NOTE — Telephone Encounter (Signed)
Refill request

## 2011-06-08 ENCOUNTER — Other Ambulatory Visit: Payer: Self-pay | Admitting: Family Medicine

## 2011-06-08 NOTE — Telephone Encounter (Signed)
Refill request

## 2011-07-08 ENCOUNTER — Other Ambulatory Visit: Payer: Self-pay | Admitting: Family Medicine

## 2011-07-09 NOTE — Telephone Encounter (Signed)
Refill request

## 2011-10-30 ENCOUNTER — Other Ambulatory Visit: Payer: Self-pay | Admitting: Family Medicine

## 2011-10-30 NOTE — Telephone Encounter (Signed)
Refill request

## 2011-11-03 ENCOUNTER — Other Ambulatory Visit: Payer: Self-pay | Admitting: Family Medicine

## 2011-11-03 NOTE — Telephone Encounter (Signed)
Refill request

## 2011-11-07 DIAGNOSIS — G8929 Other chronic pain: Secondary | ICD-10-CM | POA: Diagnosis not present

## 2011-11-07 DIAGNOSIS — G589 Mononeuropathy, unspecified: Secondary | ICD-10-CM | POA: Diagnosis not present

## 2011-11-07 DIAGNOSIS — G43709 Chronic migraine without aura, not intractable, without status migrainosus: Secondary | ICD-10-CM | POA: Diagnosis not present

## 2011-11-07 DIAGNOSIS — M549 Dorsalgia, unspecified: Secondary | ICD-10-CM | POA: Diagnosis not present

## 2011-11-07 DIAGNOSIS — IMO0001 Reserved for inherently not codable concepts without codable children: Secondary | ICD-10-CM | POA: Diagnosis not present

## 2011-11-07 DIAGNOSIS — R7989 Other specified abnormal findings of blood chemistry: Secondary | ICD-10-CM | POA: Diagnosis not present

## 2011-12-12 ENCOUNTER — Emergency Department (HOSPITAL_COMMUNITY)
Admission: EM | Admit: 2011-12-12 | Discharge: 2011-12-12 | Disposition: A | Discharge status: Home / Self Care | Payer: Medicare Other | Attending: Emergency Medicine | Admitting: Emergency Medicine

## 2011-12-12 ENCOUNTER — Encounter (HOSPITAL_COMMUNITY): Payer: Self-pay | Admitting: Physical Medicine and Rehabilitation

## 2011-12-12 DIAGNOSIS — Z79899 Other long term (current) drug therapy: Secondary | ICD-10-CM | POA: Insufficient documentation

## 2011-12-12 DIAGNOSIS — H53149 Visual discomfort, unspecified: Secondary | ICD-10-CM | POA: Insufficient documentation

## 2011-12-12 DIAGNOSIS — R110 Nausea: Secondary | ICD-10-CM | POA: Insufficient documentation

## 2011-12-12 DIAGNOSIS — R51 Headache: Secondary | ICD-10-CM

## 2011-12-12 HISTORY — DX: Headache: R51

## 2011-12-12 HISTORY — DX: Irritable bowel syndrome, unspecified: K58.9

## 2011-12-12 MED ORDER — DIPHENHYDRAMINE HCL 50 MG/ML IJ SOLN
25.0000 mg | Freq: Once | INTRAMUSCULAR | Status: AC
  Administered 2011-12-12: 25 mg via INTRAVENOUS
  Filled 2011-12-12: qty 1

## 2011-12-12 MED ORDER — KETOROLAC TROMETHAMINE 15 MG/ML IJ SOLN
15.0000 mg | INTRAMUSCULAR | Status: AC
  Administered 2011-12-12: 15 mg via INTRAVENOUS
  Filled 2011-12-12: qty 1

## 2011-12-12 MED ORDER — HYDROMORPHONE HCL PF 1 MG/ML IJ SOLN
1.0000 mg | Freq: Once | INTRAMUSCULAR | Status: AC
  Administered 2011-12-12: 1 mg via INTRAVENOUS
  Filled 2011-12-12: qty 1

## 2011-12-12 MED ORDER — SODIUM CHLORIDE 0.9 % IV BOLUS (SEPSIS)
1000.0000 mL | Freq: Once | INTRAVENOUS | Status: AC
  Administered 2011-12-12: 1000 mL via INTRAVENOUS

## 2011-12-12 MED ORDER — METOCLOPRAMIDE HCL 5 MG/ML IJ SOLN
10.0000 mg | Freq: Once | INTRAMUSCULAR | Status: AC
  Administered 2011-12-12: 10 mg via INTRAVENOUS
  Filled 2011-12-12: qty 2

## 2011-12-12 NOTE — ED Notes (Signed)
Pt presents to department for evaluation of headache. States headache ongoing for several days, can usually resolve at home, but no relief with medications. History of migraines. States nausea/vomiting, blurred vision and photosensitivity. 8/10 pain upon arrival to ED. She is alert and oriented x4. No neurological deficits noted at the time.

## 2011-12-12 NOTE — Discharge Instructions (Signed)
Headaches, Frequently Asked Questions MIGRAINE HEADACHES Q: What is migraine? What causes it? How can I treat it? A: Generally, migraine headaches begin as a dull ache. Then they develop into a constant, throbbing, and pulsating pain. You may experience pain at the temples. You may experience pain at the front or back of one or both sides of the head. The pain is usually accompanied by a combination of:  Nausea.   Vomiting.   Sensitivity to light and noise.  Some people (about 15%) experience an aura (see below) before an attack. The cause of migraine is believed to be chemical reactions in the brain. Treatment for migraine may include over-the-counter or prescription medications. It may also include self-help techniques. These include relaxation training and biofeedback.  Q: What is an aura? A: About 15% of people with migraine get an "aura". This is a sign of neurological symptoms that occur before a migraine headache. You may see wavy or jagged lines, dots, or flashing lights. You might experience tunnel vision or blind spots in one or both eyes. The aura can include visual or auditory hallucinations (something imagined). It may include disruptions in smell (such as strange odors), taste or touch. Other symptoms include:  Numbness.   A "pins and needles" sensation.   Difficulty in recalling or speaking the correct word.  These neurological events may last as long as 60 minutes. These symptoms will fade as the headache begins. Q: What is a trigger? A: Certain physical or environmental factors can lead to or "trigger" a migraine. These include:  Foods.   Hormonal changes.   Weather.   Stress.  It is important to remember that triggers are different for everyone. To help prevent migraine attacks, you need to figure out which triggers affect you. Keep a headache diary. This is a good way to track triggers. The diary will help you talk to your healthcare professional about your  condition. Q: Does weather affect migraines? A: Bright sunshine, hot, humid conditions, and drastic changes in barometric pressure may lead to, or "trigger," a migraine attack in some people. But studies have shown that weather does not act as a trigger for everyone with migraines. Q: What is the link between migraine and hormones? A: Hormones start and regulate many of your body's functions. Hormones keep your body in balance within a constantly changing environment. The levels of hormones in your body are unbalanced at times. Examples are during menstruation, pregnancy, or menopause. That can lead to a migraine attack. In fact, about three quarters of all women with migraine report that their attacks are related to the menstrual cycle.  Q: Is there an increased risk of stroke for migraine sufferers? A: The likelihood of a migraine attack causing a stroke is very remote. That is not to say that migraine sufferers cannot have a stroke associated with their migraines. In persons under age 40, the most common associated factor for stroke is migraine headache. But over the course of a person's normal life span, the occurrence of migraine headache may actually be associated with a reduced risk of dying from cerebrovascular disease due to stroke.  Q: What are acute medications for migraine? A: Acute medications are used to treat the pain of the headache after it has started. Examples over-the-counter medications, NSAIDs, ergots, and triptans.  Q: What are the triptans? A: Triptans are the newest class of abortive medications. They are specifically targeted to treat migraine. Triptans are vasoconstrictors. They moderate some chemical reactions in the brain.   The triptans work on receptors in your brain. Triptans help to restore the balance of a neurotransmitter called serotonin. Fluctuations in levels of serotonin are thought to be a main cause of migraine.  Q: Are over-the-counter medications for migraine  effective? A: Over-the-counter, or "OTC," medications may be effective in relieving mild to moderate pain and associated symptoms of migraine. But you should see your caregiver before beginning any treatment regimen for migraine.  Q: What are preventive medications for migraine? A: Preventive medications for migraine are sometimes referred to as "prophylactic" treatments. They are used to reduce the frequency, severity, and length of migraine attacks. Examples of preventive medications include antiepileptic medications, antidepressants, beta-blockers, calcium channel blockers, and NSAIDs (nonsteroidal anti-inflammatory drugs). Q: Why are anticonvulsants used to treat migraine? A: During the past few years, there has been an increased interest in antiepileptic drugs for the prevention of migraine. They are sometimes referred to as "anticonvulsants". Both epilepsy and migraine may be caused by similar reactions in the brain.  Q: Why are antidepressants used to treat migraine? A: Antidepressants are typically used to treat people with depression. They may reduce migraine frequency by regulating chemical levels, such as serotonin, in the brain.  Q: What alternative therapies are used to treat migraine? A: The term "alternative therapies" is often used to describe treatments considered outside the scope of conventional Western medicine. Examples of alternative therapy include acupuncture, acupressure, and yoga. Another common alternative treatment is herbal therapy. Some herbs are believed to relieve headache pain. Always discuss alternative therapies with your caregiver before proceeding. Some herbal products contain arsenic and other toxins. TENSION HEADACHES Q: What is a tension-type headache? What causes it? How can I treat it? A: Tension-type headaches occur randomly. They are often the result of temporary stress, anxiety, fatigue, or anger. Symptoms include soreness in your temples, a tightening  band-like sensation around your head (a "vice-like" ache). Symptoms can also include a pulling feeling, pressure sensations, and contracting head and neck muscles. The headache begins in your forehead, temples, or the back of your head and neck. Treatment for tension-type headache may include over-the-counter or prescription medications. Treatment may also include self-help techniques such as relaxation training and biofeedback. CLUSTER HEADACHES Q: What is a cluster headache? What causes it? How can I treat it? A: Cluster headache gets its name because the attacks come in groups. The pain arrives with little, if any, warning. It is usually on one side of the head. A tearing or bloodshot eye and a runny nose on the same side of the headache may also accompany the pain. Cluster headaches are believed to be caused by chemical reactions in the brain. They have been described as the most severe and intense of any headache type. Treatment for cluster headache includes prescription medication and oxygen. SINUS HEADACHES Q: What is a sinus headache? What causes it? How can I treat it? A: When a cavity in the bones of the face and skull (a sinus) becomes inflamed, the inflammation will cause localized pain. This condition is usually the result of an allergic reaction, a tumor, or an infection. If your headache is caused by a sinus blockage, such as an infection, you will probably have a fever. An x-ray will confirm a sinus blockage. Your caregiver's treatment might include antibiotics for the infection, as well as antihistamines or decongestants.  REBOUND HEADACHES Q: What is a rebound headache? What causes it? How can I treat it? A: A pattern of taking acute headache medications too   often can lead to a condition known as "rebound headache." A pattern of taking too much headache medication includes taking it more than 2 days per week or in excessive amounts. That means more than the label or a caregiver advises.  With rebound headaches, your medications not only stop relieving pain, they actually begin to cause headaches. Doctors treat rebound headache by tapering the medication that is being overused. Sometimes your caregiver will gradually substitute a different type of treatment or medication. Stopping may be a challenge. Regularly overusing a medication increases the potential for serious side effects. Consult a caregiver if you regularly use headache medications more than 2 days per week or more than the label advises. ADDITIONAL QUESTIONS AND ANSWERS Q: What is biofeedback? A: Biofeedback is a self-help treatment. Biofeedback uses special equipment to monitor your body's involuntary physical responses. Biofeedback monitors:  Breathing.   Pulse.   Heart rate.   Temperature.   Muscle tension.   Brain activity.  Biofeedback helps you refine and perfect your relaxation exercises. You learn to control the physical responses that are related to stress. Once the technique has been mastered, you do not need the equipment any more. Q: Are headaches hereditary? A: Four out of five (80%) of people that suffer report a family history of migraine. Scientists are not sure if this is genetic or a family predisposition. Despite the uncertainty, a child has a 50% chance of having migraine if one parent suffers. The child has a 75% chance if both parents suffer.  Q: Can children get headaches? A: By the time they reach high school, most young people have experienced some type of headache. Many safe and effective approaches or medications can prevent a headache from occurring or stop it after it has begun.  Q: What type of doctor should I see to diagnose and treat my headache? A: Start with your primary caregiver. Discuss his or her experience and approach to headaches. Discuss methods of classification, diagnosis, and treatment. Your caregiver may decide to recommend you to a headache specialist, depending upon  your symptoms or other physical conditions. Having diabetes, allergies, etc., may require a more comprehensive and inclusive approach to your headache. The National Headache Foundation will provide, upon request, a list of NHF physician members in your state. Document Released: 11/11/2003 Document Revised: 08/10/2011 Document Reviewed: 04/20/2008 ExitCare Patient Information 2012 [employer].  RESOURCE GUIDE  Dental Problems  Patients with Medicaid: [facility]                     [facility] [address]                                           [address] Phone:  [phone]                                                  Phone:  [phone]  If unable to pay or uninsured, contact:  [facility] or [facility] to become qualified for the adult dental clinic.  Chronic Pain Problems Contact [facility]  297-2271 Patients need to be referred by their primary care doctor.  Insufficient Money for Medicine Contact   United Way:  call "211" or [facility] 271-5999.  No Primary Care Doctor Call [facility]  832-8000 Other agencies that provide inexpensive medical care    [facility]  832-8035    [facility]  832-7272    [facility]  [phone]    Women's Clinic  832-4777    [facility]  373-0678    [facility]  272-1050  Psychological Services [facility]  832-9600 [facility]  [phone] [facility]   800 853-5163 (emergency services 641-4993)  Substance Abuse Resources [facility]  336-882-2125 [facility] 336-784-9470 [facility] 336-285-9073 [facility] 336-845-3988 Residential & Outpatient Substance Abuse Program  800-659-3381  Abuse/Neglect [place] Child Abuse Hotline (336) 641-3795 [place] Child Abuse Hotline 800-378-5315 (After  Hours)  Emergency Shelter [facility] (336) 271-5985  [facility] (336) 275-9566 [facility] (704) 372-4663  MRSA Hotline #:   832-7006    [place] Resources  [facility] [place]     [employer]                          [facility] [address] [place]                       [address]      [address]  [place]                                                [place]                            [place] Phone:  349-3220                                   Phone:  342-7768                 Phone:  [phone]  [facility] Phone:  342-8316  [place] Child Abuse Hotline (336) 342-1394 (336) 342-3537 (After Hours)   

## 2011-12-16 ENCOUNTER — Other Ambulatory Visit: Payer: Self-pay | Admitting: Family Medicine

## 2011-12-20 DIAGNOSIS — L259 Unspecified contact dermatitis, unspecified cause: Secondary | ICD-10-CM | POA: Diagnosis not present

## 2011-12-20 DIAGNOSIS — L570 Actinic keratosis: Secondary | ICD-10-CM | POA: Diagnosis not present

## 2011-12-20 DIAGNOSIS — D485 Neoplasm of uncertain behavior of skin: Secondary | ICD-10-CM | POA: Diagnosis not present

## 2011-12-20 DIAGNOSIS — D239 Other benign neoplasm of skin, unspecified: Secondary | ICD-10-CM | POA: Diagnosis not present

## 2011-12-20 DIAGNOSIS — Z85828 Personal history of other malignant neoplasm of skin: Secondary | ICD-10-CM | POA: Diagnosis not present

## 2011-12-22 NOTE — ED Provider Notes (Signed)
History    66yF with HA. Gradual onset about 3d ago. Hx of what she calls migraines and feels similar to previous but not relieved with home meds. NO fever or chills. No trauma. No neck stiffness. Nauseated. Photophobia which is typical of her HAs .  CSN: [id number]  Arrival date & time 12/12/11  1308   First MD Initiated Contact with Patient 12/12/11 1654      Chief Complaint  Patient presents with  . Headache    (Consider location/radiation/quality/duration/timing/severity/associated sxs/prior treatment) HPI  Past Medical History  Diagnosis Date  . Headache   . IBS (irritable bowel syndrome)     No past surgical history on file.  No family history on file.  History  Substance Use Topics  . Smoking status: Never Smoker   . Smokeless tobacco: Not on file  . Alcohol Use: No    OB History    Grav Para Term Preterm Abortions TAB SAB Ect Mult Living                  Review of Systems   Review of symptoms negative unless otherwise noted in HPI.   Allergies  Bupropion hcl; Azithromycin; and Erythromycin  Home Medications   Current Outpatient Rx  Name Route Sig Dispense Refill  . ASPIRIN 81 MG PO TBEC Oral Take 81 mg by mouth daily.      Marland Kitchen CALCIUM CITRATE-VITAMIN D 250-200 MG-UNIT PO TABS Oral Take 1 tablet by mouth 2 (two) times daily.      Marland Kitchen VITAMIN D3 2000 UNITS PO CAPS Oral Take 2,000 Units by mouth at bedtime.      Marland Kitchen CLONAZEPAM 1 MG PO TABS Oral Take 1 mg by mouth 2 (two) times daily. Per Dr. Evelene Croon    . COENZYME Q10 300 MG PO CAPS Oral Take 1 capsule by mouth daily.      . DULOXETINE HCL 60 MG PO CPEP Oral Take 60 mg by mouth daily. Per Dr. Evelene Croon     . FLUTICASONE PROPIONATE 50 MCG/ACT NA SUSP Nasal 2 sprays by Nasal route daily.      . FROVATRIPTAN SUCCINATE 2.5 MG PO TABS Oral Take 2.5 mg by mouth as needed. If recurs, may repeat after 2 hours. Max of 3 tabs in 24 hours.  Per Dr. Conrad Sheffield     . FUROSEMIDE 20 MG PO TABS Oral Take 20 mg by mouth 2 (two) times  daily.    Marland Kitchen LEVOTHYROXINE SODIUM 112 MCG PO TABS Oral Take 112 mcg by mouth daily.    . CENTRUM SILVER PO Oral Take by mouth.      . OMEGA-3 FATTY ACIDS 500 MG PO CAPS Oral Take by mouth.      . ONDANSETRON HCL 4 MG PO TABS Oral Take 4 mg by mouth every 8 (eight) hours as needed. for migraine per Dr. Loreta Ave    . OXYCODONE HCL 5 MG PO CAPS Oral Take 5 mg by mouth every 4 (four) hours as needed. For pain. Per Dr. Ethelene Hal    . OXYCODONE HCL ER 20 MG PO TB12 Oral Take 20 mg by mouth 4 (four) times daily. Per Dr. Conrad Malaga     . PANTOPRAZOLE SODIUM 40 MG PO TBEC Oral Take 40 mg by mouth daily.    Marland Kitchen PREGABALIN 100 MG PO CAPS Oral Take 100 mg by mouth 5 (five) times daily. Per Dr. Celedonio Miyamoto    . ALIGN 4 MG PO CAPS Oral Take 4 mg by mouth daily.    Marland Kitchen  QUETIAPINE FUMARATE 50 MG PO TABS Oral Take 50 mg by mouth at bedtime.      Marland Kitchen RAMELTEON 8 MG PO TABS Oral Take 8 mg by mouth at bedtime. Per Dr. Evelene Croon     . SAM-E 200 MG PO TABS Oral Take 2 tablets by mouth daily. Per patient    . TIZANIDINE HCL 4 MG PO TABS Oral Take 2 mg by mouth 2 (two) times daily. 1/2 tablet every morning, 1/2 tablet at supper per Dr. Conrad Hazen    . LEVOTHYROXINE SODIUM 112 MCG PO TABS  TAKE 1 TABLET BY MOUTH DAILY FOR THYROID 30 tablet 0    Needs appointment for further refills  . ZOSTER VACCINE LIVE 19400 UNT/0.65ML [place] SOLR Subcutaneous Inject into the skin once.        BP 136/94  Pulse 94  Temp(Src) 96.9 F (36.1 C) (Oral)  Resp 20  SpO2 95%  Physical Exam  Nursing note and vitals reviewed. Constitutional: She is oriented to person, place, and time. She appears well-developed and well-nourished. No distress.  HENT:  Head: Normocephalic and atraumatic.  Eyes: Conjunctivae are normal. Right eye exhibits no discharge. Left eye exhibits no discharge.  Neck: Normal range of motion. Neck supple.  Cardiovascular: Normal rate, regular rhythm and normal heart sounds.  Exam reveals no gallop and no friction rub.   No murmur  heard. Pulmonary/Chest: Effort normal and breath sounds normal. No respiratory distress.  Abdominal: Soft. She exhibits no distension. There is no tenderness.  Musculoskeletal: She exhibits no edema and no tenderness.  Lymphadenopathy:    She has no cervical adenopathy.  Neurological: She is alert and oriented to person, place, and time. No cranial nerve deficit. She exhibits normal muscle tone. Coordination normal.       Gait steady. Good finger to nose testing b/l.  Skin: Skin is warm and dry.  Psychiatric: She has a normal mood and affect. Her behavior is normal. Thought content normal.    ED Course  Procedures (including critical care time)  Labs Reviewed - No data to display No results found.   1. Headache       MDM  66yF with HA. Suspect primary HA. Consider emergent secondary causes such as bleed, infectious or mass but doubt. There is no history of trauma. Pt has a nonfocal neurological exam. Afebrile and neck supple. No use of blood thinning medication aside from 81mg  asa. Consider ocular etiology such as acute angle closure glaucoma but doubt. Pt denies acute change in visual acuity and eye exam unremarkable. Doubt temporal arteritis given age, no temporal tenderness and temporal artery pulsations palpable. Doubt CO poisoning. No contacts with similar symptoms. Doubt venous thrombosis. Doubt carotid or vertebral arteries dissection. Symptoms improved with meds. Feel that can be safely discharged, but strict return precautions discussed. Outpt fu.         Raeford Razor, MD 12/22/11 0930

## 2012-01-01 DIAGNOSIS — S42209A Unspecified fracture of upper end of unspecified humerus, initial encounter for closed fracture: Secondary | ICD-10-CM | POA: Diagnosis not present

## 2012-01-08 DIAGNOSIS — S42209A Unspecified fracture of upper end of unspecified humerus, initial encounter for closed fracture: Secondary | ICD-10-CM | POA: Diagnosis not present

## 2012-01-21 ENCOUNTER — Other Ambulatory Visit: Payer: Self-pay | Admitting: Family Medicine

## 2012-01-31 ENCOUNTER — Other Ambulatory Visit: Payer: Self-pay | Admitting: Family Medicine

## 2012-02-01 DIAGNOSIS — S42293A Other displaced fracture of upper end of unspecified humerus, initial encounter for closed fracture: Secondary | ICD-10-CM | POA: Diagnosis not present

## 2012-02-16 DIAGNOSIS — M25569 Pain in unspecified knee: Secondary | ICD-10-CM | POA: Diagnosis not present

## 2012-02-20 ENCOUNTER — Other Ambulatory Visit: Payer: Self-pay | Admitting: Family Medicine

## 2012-02-23 DIAGNOSIS — S42293A Other displaced fracture of upper end of unspecified humerus, initial encounter for closed fracture: Secondary | ICD-10-CM | POA: Diagnosis not present

## 2012-02-24 ENCOUNTER — Other Ambulatory Visit: Payer: Self-pay | Admitting: Family Medicine

## 2012-02-27 ENCOUNTER — Telehealth: Payer: Self-pay | Admitting: Family Medicine

## 2012-02-27 MED ORDER — LEVOTHYROXINE SODIUM 112 MCG PO TABS: 112.0000 ug | ORAL_TABLET | Freq: Every day | ORAL | Status: DC

## 2012-02-27 NOTE — Telephone Encounter (Signed)
States that she is out of her thyroid meds and she has an appt on Monday and wants to know if she can get enough called into pharm until appt [facility] [address]

## 2012-02-27 NOTE — Telephone Encounter (Signed)
i have sent in rx

## 2012-03-04 ENCOUNTER — Ambulatory Visit (INDEPENDENT_AMBULATORY_CARE_PROVIDER_SITE_OTHER): Payer: Medicare Other | Admitting: Family Medicine

## 2012-03-04 ENCOUNTER — Encounter: Payer: Self-pay | Admitting: Family Medicine

## 2012-03-04 VITALS — BP 100/58 | Temp 97.1°F | Ht 61.25 in | Wt 219.5 lb

## 2012-03-04 DIAGNOSIS — E669 Obesity, unspecified: Secondary | ICD-10-CM

## 2012-03-04 DIAGNOSIS — Z13220 Encounter for screening for lipoid disorders: Secondary | ICD-10-CM | POA: Diagnosis not present

## 2012-03-04 DIAGNOSIS — E039 Hypothyroidism, unspecified: Secondary | ICD-10-CM

## 2012-03-04 DIAGNOSIS — M899 Disorder of bone, unspecified: Secondary | ICD-10-CM | POA: Diagnosis not present

## 2012-03-04 DIAGNOSIS — R269 Unspecified abnormalities of gait and mobility: Secondary | ICD-10-CM

## 2012-03-04 DIAGNOSIS — K219 Gastro-esophageal reflux disease without esophagitis: Secondary | ICD-10-CM | POA: Diagnosis not present

## 2012-03-04 DIAGNOSIS — R600 Localized edema: Secondary | ICD-10-CM | POA: Insufficient documentation

## 2012-03-04 DIAGNOSIS — D509 Iron deficiency anemia, unspecified: Secondary | ICD-10-CM

## 2012-03-04 DIAGNOSIS — M949 Disorder of cartilage, unspecified: Secondary | ICD-10-CM

## 2012-03-04 DIAGNOSIS — R609 Edema, unspecified: Secondary | ICD-10-CM

## 2012-03-04 MED ORDER — FUROSEMIDE 20 MG PO TABS: 20.0000 mg | ORAL_TABLET | Freq: Every day | ORAL | Status: DC

## 2012-03-04 MED ORDER — LEVOTHYROXINE SODIUM 112 MCG PO TABS: 112.0000 ug | ORAL_TABLET | Freq: Every day | ORAL | Status: DC

## 2012-03-04 MED ORDER — ZOSTER VACCINE LIVE 19400 UNT/0.65ML ~~LOC~~ SOLR: 1.0000 [IU] | Freq: Once | SUBCUTANEOUS | Status: DC

## 2012-03-04 MED ORDER — PANTOPRAZOLE SODIUM 20 MG PO TBEC: 40.0000 mg | DELAYED_RELEASE_TABLET | Freq: Every day | ORAL | Status: DC

## 2012-03-04 NOTE — Progress Notes (Signed)
  Subjective:    Patient ID: [name], female    DOB: Sep 22, 1945, 66 y.o.   MRN: [id number]  HPI Here for follow-up, has not been seen in over a year  Since last seen, has had a right shoulder injury due to fall.  She chose nonoperative approach has arm in sling, is going to physical therapy.  Hypothyroidism:  Has not been taking for 2 weeks due to miscommunication about refills.  No new constipation, fatigue.  GERD:  Taking pantoprazole 40 mg daily for history of gastric ulcer, ? Stenosis needing resection years ago.  No symptomatic GERD    Flonase:  Uses occassionally  LE edema:  Use one lasix 1-2 times per week.  Not using compression hose.  Not worsening.  I have reviewed patient's  PMH, FH, and Social history and Medications as related to this visit. No new diagnoses or changes in care from multiple specialists since last seen. Review of Systems See HPI    Objective:   Physical Exam GEN: Alert, No acute distress, here with her husband CV:  Regular Rate & Rhythm, no murmur Respiratory:  Normal work of breathing, CTAB Abd:  + BS, soft, no tenderness to palpation MSK; right arm in sling Ext: trace pretibial edema Right > left         Assessment & Plan:  Prevention:  Has colonoscopy several years ago, not sure when due again.  Patient will contact their GI's office.    Rx for zostavax given today

## 2012-03-04 NOTE — Assessment & Plan Note (Signed)
Remote, will check CBC when she returns for fasting bloodwork

## 2012-03-04 NOTE — Assessment & Plan Note (Signed)
Remote.  Will decreased pantoprazole from 40 mg per day to 20 mg per day.

## 2012-03-04 NOTE — Assessment & Plan Note (Signed)
Per husband, has had recent normal DEXA.  I do not have records,  He will obtain and bring for me to review.

## 2012-03-04 NOTE — Assessment & Plan Note (Signed)
Chronic doing well today. Discussed decreasing salt intake, compression stockings.  Patient would like to have lasix on hand to use prn.  Will check CMET.  Discussed if uses it more than once or twice per week, to please follow-up so we can monitor BP and labwork.

## 2012-03-04 NOTE — Patient Instructions (Signed)
Lasix- ok to take a 1-2 per week.  If you notice you are taking more regularly, let me know so we can monitor your BP and labwork more closely  Come back in 6 weeks for fasting bloodwork- will check your thyroid, lipid, electrolyes  Pantoprazole- decrease to 20 mg daily- ok to not take every day if you d on't need it  Follow-up with Dr. Loreta Ave to see about when colonoscopy is due  Let me know if you do not find a DEXA (bone density) scan that was normal.

## 2012-03-08 DIAGNOSIS — S42293A Other displaced fracture of upper end of unspecified humerus, initial encounter for closed fracture: Secondary | ICD-10-CM | POA: Diagnosis not present

## 2012-03-19 DIAGNOSIS — S42293A Other displaced fracture of upper end of unspecified humerus, initial encounter for closed fracture: Secondary | ICD-10-CM | POA: Diagnosis not present

## 2012-03-25 DIAGNOSIS — S42293A Other displaced fracture of upper end of unspecified humerus, initial encounter for closed fracture: Secondary | ICD-10-CM | POA: Diagnosis not present

## 2012-03-27 DIAGNOSIS — S42293A Other displaced fracture of upper end of unspecified humerus, initial encounter for closed fracture: Secondary | ICD-10-CM | POA: Diagnosis not present

## 2012-03-29 DIAGNOSIS — S42293A Other displaced fracture of upper end of unspecified humerus, initial encounter for closed fracture: Secondary | ICD-10-CM | POA: Diagnosis not present

## 2012-04-11 DIAGNOSIS — L259 Unspecified contact dermatitis, unspecified cause: Secondary | ICD-10-CM | POA: Diagnosis not present

## 2012-05-02 ENCOUNTER — Other Ambulatory Visit: Payer: Self-pay | Admitting: Family Medicine

## 2012-07-03 ENCOUNTER — Other Ambulatory Visit: Payer: Self-pay | Admitting: Family Medicine

## 2012-07-10 ENCOUNTER — Other Ambulatory Visit: Payer: Self-pay | Admitting: *Deleted

## 2012-07-10 MED ORDER — FLUTICASONE PROPIONATE 50 MCG/ACT NA SUSP: 2.0000 | Freq: Every day | NASAL | Status: DC

## 2012-07-17 ENCOUNTER — Other Ambulatory Visit: Payer: Self-pay | Admitting: Family Medicine

## 2012-07-24 DIAGNOSIS — S42293A Other displaced fracture of upper end of unspecified humerus, initial encounter for closed fracture: Secondary | ICD-10-CM | POA: Diagnosis not present

## 2012-08-08 DIAGNOSIS — R200 Anesthesia of skin: Secondary | ICD-10-CM | POA: Insufficient documentation

## 2012-08-08 DIAGNOSIS — R209 Unspecified disturbances of skin sensation: Secondary | ICD-10-CM | POA: Diagnosis not present

## 2012-08-08 DIAGNOSIS — IMO0001 Reserved for inherently not codable concepts without codable children: Secondary | ICD-10-CM | POA: Diagnosis not present

## 2012-08-08 DIAGNOSIS — G43709 Chronic migraine without aura, not intractable, without status migrainosus: Secondary | ICD-10-CM | POA: Diagnosis not present

## 2012-08-14 ENCOUNTER — Encounter: Payer: Self-pay | Admitting: Family Medicine

## 2012-08-14 DIAGNOSIS — M797 Fibromyalgia: Secondary | ICD-10-CM | POA: Insufficient documentation

## 2012-09-12 DIAGNOSIS — Z23 Encounter for immunization: Secondary | ICD-10-CM | POA: Diagnosis not present

## 2012-10-07 DIAGNOSIS — M1710 Unilateral primary osteoarthritis, unspecified knee: Secondary | ICD-10-CM | POA: Diagnosis not present

## 2012-10-14 DIAGNOSIS — S42293A Other displaced fracture of upper end of unspecified humerus, initial encounter for closed fracture: Secondary | ICD-10-CM | POA: Diagnosis not present

## 2012-10-15 DIAGNOSIS — M1710 Unilateral primary osteoarthritis, unspecified knee: Secondary | ICD-10-CM | POA: Diagnosis not present

## 2013-01-14 DIAGNOSIS — S42293A Other displaced fracture of upper end of unspecified humerus, initial encounter for closed fracture: Secondary | ICD-10-CM | POA: Diagnosis not present

## 2013-01-20 ENCOUNTER — Other Ambulatory Visit: Payer: Self-pay | Admitting: Orthopedic Surgery

## 2013-01-20 DIAGNOSIS — S42293A Other displaced fracture of upper end of unspecified humerus, initial encounter for closed fracture: Secondary | ICD-10-CM

## 2013-01-21 ENCOUNTER — Ambulatory Visit
Admission: RE | Admit: 2013-01-21 | Discharge: 2013-01-21 | Disposition: A | Discharge status: Home / Self Care | Payer: Medicare Other | Source: Ambulatory Visit | Attending: Orthopedic Surgery | Admitting: Orthopedic Surgery

## 2013-01-21 DIAGNOSIS — S42293A Other displaced fracture of upper end of unspecified humerus, initial encounter for closed fracture: Secondary | ICD-10-CM

## 2013-01-21 DIAGNOSIS — IMO0002 Reserved for concepts with insufficient information to code with codable children: Secondary | ICD-10-CM | POA: Diagnosis not present

## 2013-02-03 DIAGNOSIS — S42293A Other displaced fracture of upper end of unspecified humerus, initial encounter for closed fracture: Secondary | ICD-10-CM | POA: Diagnosis not present

## 2013-02-05 DIAGNOSIS — M545 Low back pain: Secondary | ICD-10-CM | POA: Diagnosis not present

## 2013-02-05 DIAGNOSIS — G894 Chronic pain syndrome: Secondary | ICD-10-CM | POA: Diagnosis not present

## 2013-02-15 ENCOUNTER — Other Ambulatory Visit: Payer: Self-pay | Admitting: Family Medicine

## 2013-02-18 ENCOUNTER — Telehealth: Payer: Self-pay | Admitting: Family Medicine

## 2013-02-18 DIAGNOSIS — S42209A Unspecified fracture of upper end of unspecified humerus, initial encounter for closed fracture: Secondary | ICD-10-CM | POA: Diagnosis not present

## 2013-02-18 NOTE — Telephone Encounter (Signed)
Husband is calling requesting a letter of clearance for surgery for his wife.  She fell and injured her shoulder, some broken bones that will not heal without surgery.  The letter needs to be faxed to [facility], Attention: Alfonso Ramus, Fax # [phone].

## 2013-02-18 NOTE — Telephone Encounter (Signed)
Last OV was 03/04/2012.  Spoke with patient's husband and instructed pt needs OV.  Verbalized understanding and will call back and schedule.  Riniyah Speich, Darlyne Russian, CMA

## 2013-02-26 ENCOUNTER — Encounter: Payer: Self-pay | Admitting: Family Medicine

## 2013-02-26 ENCOUNTER — Ambulatory Visit (INDEPENDENT_AMBULATORY_CARE_PROVIDER_SITE_OTHER): Payer: Medicare Other | Admitting: Family Medicine

## 2013-02-26 ENCOUNTER — Ambulatory Visit (HOSPITAL_COMMUNITY)
Admission: RE | Admit: 2013-02-26 | Discharge: 2013-02-26 | Disposition: A | Discharge status: Home / Self Care | Payer: Medicare Other | Source: Ambulatory Visit | Attending: Family Medicine | Admitting: Family Medicine

## 2013-02-26 VITALS — BP 127/86 | HR 102 | Temp 98.7°F | Ht 61.25 in | Wt 219.0 lb

## 2013-02-26 DIAGNOSIS — I5032 Chronic diastolic (congestive) heart failure: Secondary | ICD-10-CM

## 2013-02-26 DIAGNOSIS — I509 Heart failure, unspecified: Secondary | ICD-10-CM | POA: Diagnosis not present

## 2013-02-26 DIAGNOSIS — Z01818 Encounter for other preprocedural examination: Secondary | ICD-10-CM | POA: Insufficient documentation

## 2013-02-26 DIAGNOSIS — Z01810 Encounter for preprocedural cardiovascular examination: Secondary | ICD-10-CM | POA: Insufficient documentation

## 2013-02-26 DIAGNOSIS — E871 Hypo-osmolality and hyponatremia: Secondary | ICD-10-CM

## 2013-02-26 DIAGNOSIS — R9431 Abnormal electrocardiogram [ECG] [EKG]: Secondary | ICD-10-CM | POA: Insufficient documentation

## 2013-02-26 DIAGNOSIS — M25819 Other specified joint disorders, unspecified shoulder: Secondary | ICD-10-CM | POA: Diagnosis not present

## 2013-02-26 DIAGNOSIS — E0590 Thyrotoxicosis, unspecified without thyrotoxic crisis or storm: Secondary | ICD-10-CM | POA: Insufficient documentation

## 2013-02-26 DIAGNOSIS — Z79899 Other long term (current) drug therapy: Secondary | ICD-10-CM | POA: Diagnosis not present

## 2013-02-26 DIAGNOSIS — E039 Hypothyroidism, unspecified: Secondary | ICD-10-CM | POA: Diagnosis not present

## 2013-02-26 HISTORY — DX: Encounter for other preprocedural examination: Z01.818

## 2013-02-26 NOTE — Patient Instructions (Addendum)
Thank you for coming in today. EKG is non-ischemic.   I will be in touch regarding lab results.  Dr. Armen Pickup

## 2013-02-26 NOTE — Progress Notes (Signed)
Patient ID: VERMELL MADRID, female   DOB: May 15, 1946, 67 y.o.   MRN: [id number]  Subjective:    [name] is a 67 y.o. female who presents to the office today for a preoperative consultation at the request of surgeon Malon Kindle, M.D.  who plans on performing Right shoulder: Hemiarthroplasty vs. Reverse total shoulder on a yet to be determined date.  This consultation is requested for the specific conditions prompting preoperative evaluation (i.e. because of potential affect on operative risk): none. Planned anesthesia is general. The patient has the following known anesthesia issues: none . Patient has a bleeding risk of: no recent abnormal bleeding. Patient does not have objections to receiving blood products if needed.  Review of Systems A comprehensive review of systems was negative.   Patient drinks 96-120 oz of diet, sodium free soda daily.   Objective:    Physical Exam BP 127/86  Pulse 102  Temp(Src) 98.7 F (37.1 C) (Oral)  Ht 5' 1.25" (1.556 m)  Wt 219 lb (99.338 kg)  BMI 41.03 kg/m2 General appearance: alert, cooperative, no distress Neck: no adenopathy, no carotid bruit, no JVD, supple, symmetrical, trachea midline and thyroid not enlarged, symmetric, no tenderness/mass/nodules Lungs: clear to auscultation bilaterally Heart: regular rate and rhythm, S1, S2 normal, no murmur, click, rub or gallop Extremities: trace foot edema, extremities normal, atraumatic, no cyanosis Pulses: 2+ and symmetric  Predictors of intubation difficulty:  Morbid obesity? yes   Anatomically abnormal facies? no  Prominent incisors? no  Receding mandible? no  Short, thick neck? no  Neck range of motion: normal  Dentition: No chipped, loose, or missing teeth.  Cardiographics ECG: normal sinus rhythm, no blocks or conduction defects, no ischemic changes Echocardiogram: not done  Imaging Chest x-ray: not done   Lab Review  Lab Results  Component Value Date   WBC 5.8 02/26/2013   HGB 12.6  02/26/2013   HCT 37.8 02/26/2013   MCV 78.9 02/26/2013   PLT 199 02/26/2013     Chemistry      Component Value Date/Time   NA 126* 02/26/2013 1537   K 4.2 02/26/2013 1537   CL 97 02/26/2013 1537   CO2 18* 02/26/2013 1537   BUN 9 02/26/2013 1537   CREATININE 0.82 02/26/2013 1537   CREATININE 0.76 08/06/2010 0601      Component Value Date/Time   CALCIUM 8.8 02/26/2013 1537   ALKPHOS 79 02/26/2013 1537   AST 22 02/26/2013 1537   ALT 13 02/26/2013 1537   BILITOT 0.4 02/26/2013 1537     Lab Results  Component Value Date   TSH 0.758 02/26/2013      Assessment:    67 y.o. female with planned surgery as above.   Known risk factors for perioperative complications: None   Difficulty with intubation is anticipated.  Cardiac Risk Estimation: per the Revised Cardiac Risk Index (Circ. 100:1043, 1999), the patient's risk factors for cardiac complications include none , putting her in: RCI RISK CLASS I (0 risk factors, risk of major cardiac compl. appr. 0.5%)  Current medications which may produce withdrawal symptoms if withheld perioperatively: klonopin, oxycodone,  oxycontin.     Plan:    1. Preoperative workup as follows electrolytes. 2. Change in medication regimen before surgery: discontinue ASA 14 days before surgery. 3. Prophylaxis for cardiac events with perioperative beta-blockers: not indicated. 4. Invasive hemodynamic monitoring perioperatively: not indicated. 5. Deep vein thrombosis prophylaxis postoperatively:regimen to be chosen by surgical team. 6. Surveillance for postoperative MI with ECG immediately  postoperatively and on postoperative days 1 and 2 AND troponin levels 24 hours postoperatively and on day 4 or hospital discharge (whichever comes first): should be considered. 7. Other measures: Delay of surgery to correct electrolyte abnormality (hyponatremia and low bicarb)

## 2013-02-27 ENCOUNTER — Telehealth: Payer: Self-pay | Admitting: Family Medicine

## 2013-02-27 DIAGNOSIS — I5030 Unspecified diastolic (congestive) heart failure: Secondary | ICD-10-CM | POA: Insufficient documentation

## 2013-02-27 DIAGNOSIS — E871 Hypo-osmolality and hyponatremia: Secondary | ICD-10-CM | POA: Insufficient documentation

## 2013-02-27 HISTORY — DX: Hypo-osmolality and hyponatremia: E87.1

## 2013-02-27 LAB — COMPREHENSIVE METABOLIC PANEL
ALT: 13 U/L (ref 0–35)
AST: 22 U/L (ref 0–37)
Alkaline Phosphatase: 79 U/L (ref 39–117)
Calcium: 8.8 mg/dL (ref 8.4–10.5)
Chloride: 97 mEq/L (ref 96–112)
Creat: 0.82 mg/dL (ref 0.50–1.10)
Total Bilirubin: 0.4 mg/dL (ref 0.3–1.2)

## 2013-02-27 LAB — CBC
HCT: 37.8 % (ref 36.0–46.0)
Hemoglobin: 12.6 g/dL (ref 12.0–15.0)
MCH: 26.3 pg (ref 26.0–34.0)
MCHC: 33.3 g/dL (ref 30.0–36.0)
MCV: 78.9 fL (ref 78.0–100.0)
Platelets: 199 K/uL (ref 150–400)
RBC: 4.79 MIL/uL (ref 3.87–5.11)
RDW: 15.6 % — ABNORMAL HIGH (ref 11.5–15.5)
WBC: 5.8 K/uL (ref 4.0–10.5)

## 2013-02-27 LAB — TSH: TSH: 0.758 u[IU]/mL (ref 0.350–4.500)

## 2013-02-27 NOTE — Assessment & Plan Note (Addendum)
A: patient with hyponatremia with hypoosmolality. Have reviewed her medications without no medication associated with this. Patient is not diabetic and her hyperthyroidism is being appropriately treated (normal TSH). After further discussion with her husband is clear that she has excessive intake of solute free fluid. She's also been compliant with a low-salt diet since last year.  P: Place patient on fluid restriction to 36 ounces per day. Patient will follow up in 4 days repeat blood work which will include BMP, BNP, serum osmalality and ADH level, uric acid level, urine osm, urine sodium and urine Cr.

## 2013-02-27 NOTE — Assessment & Plan Note (Signed)
ECHO 03/05/2009: grade 2 diastolic dysfunction. EF 60-65%.

## 2013-02-27 NOTE — Telephone Encounter (Signed)
As the patient's cousin and told him that all labs are normal except for low sodium.  He formerly the patient drinks 96-120 ounces of fluid per day. Inform him that the patient should now be on fluid restriction to 36 ounces per day.  I asked that she bring his wife back to the clinic on Monday for repeat labs.

## 2013-02-27 NOTE — Assessment & Plan Note (Signed)
Postpone surgery until evaluation and  correction of hyponatremia.

## 2013-03-03 ENCOUNTER — Other Ambulatory Visit: Payer: Medicare Other

## 2013-03-03 DIAGNOSIS — E871 Hypo-osmolality and hyponatremia: Secondary | ICD-10-CM | POA: Diagnosis not present

## 2013-03-03 DIAGNOSIS — I5032 Chronic diastolic (congestive) heart failure: Secondary | ICD-10-CM | POA: Diagnosis not present

## 2013-03-03 DIAGNOSIS — I509 Heart failure, unspecified: Secondary | ICD-10-CM | POA: Diagnosis not present

## 2013-03-03 DIAGNOSIS — R209 Unspecified disturbances of skin sensation: Secondary | ICD-10-CM | POA: Diagnosis not present

## 2013-03-03 DIAGNOSIS — G43709 Chronic migraine without aura, not intractable, without status migrainosus: Secondary | ICD-10-CM | POA: Diagnosis not present

## 2013-03-03 NOTE — Telephone Encounter (Signed)
Correction,  I called the patients's husbands.

## 2013-03-03 NOTE — Progress Notes (Signed)
LABS DONE TODAY Jeet Shough 

## 2013-03-04 LAB — BASIC METABOLIC PANEL
BUN: 9 mg/dL (ref 6–23)
Calcium: 9.2 mg/dL (ref 8.4–10.5)
Creat: 0.84 mg/dL (ref 0.50–1.10)
Glucose, Bld: 100 mg/dL — ABNORMAL HIGH (ref 70–99)

## 2013-03-04 LAB — OSMOLALITY: Osmolality: 263 mOsm/kg — ABNORMAL LOW (ref 275–300)

## 2013-03-06 ENCOUNTER — Telehealth: Payer: Self-pay | Admitting: Family Medicine

## 2013-03-06 DIAGNOSIS — E871 Hypo-osmolality and hyponatremia: Secondary | ICD-10-CM

## 2013-03-06 NOTE — Assessment & Plan Note (Signed)
A: Sodium still low. Still teasing out cause (nephrogenic salt wasting, SIADH, DI) ?  Plan: Continue fluid restriction Hold lasix F/u with me next week: will check UA at this time. Check Sp Grav, ? Concentrating urine. Will also check BMP.  ? cymbalta dose increase from last year. cymbalta can cause hyponatremia.

## 2013-03-06 NOTE — Telephone Encounter (Signed)
Called patient Left VM Sodium still low. Still teasing out cause (nephrogenic salt wasting, SIADH, DI) ?  Plan: Continue fluid restriction Hold lasix F/u with me next week: will check UA at this time. Check Sp Grav, ? Concentrating urine. Will also check BMP.   ? cymbalta dose increase from last year. cymbalta can cause hyponatremia.

## 2013-03-13 ENCOUNTER — Encounter: Payer: Self-pay | Admitting: Family Medicine

## 2013-03-13 ENCOUNTER — Ambulatory Visit (INDEPENDENT_AMBULATORY_CARE_PROVIDER_SITE_OTHER): Payer: Medicare Other | Admitting: Family Medicine

## 2013-03-13 VITALS — BP 116/87 | HR 128 | Temp 98.0°F | Wt 219.0 lb

## 2013-03-13 DIAGNOSIS — E871 Hypo-osmolality and hyponatremia: Secondary | ICD-10-CM | POA: Diagnosis not present

## 2013-03-13 LAB — BASIC METABOLIC PANEL
BUN: 9 mg/dL (ref 6–23)
CO2: 24 mEq/L (ref 19–32)
Calcium: 8.8 mg/dL (ref 8.4–10.5)
Chloride: 94 mEq/L — ABNORMAL LOW (ref 96–112)
Creat: 0.86 mg/dL (ref 0.50–1.10)
Glucose, Bld: 107 mg/dL — ABNORMAL HIGH (ref 70–99)

## 2013-03-13 LAB — POCT URINALYSIS DIPSTICK
Bilirubin, UA: NEGATIVE
Ketones, UA: NEGATIVE
Protein, UA: NEGATIVE
Spec Grav, UA: 1.01
pH, UA: 7

## 2013-03-13 NOTE — Assessment & Plan Note (Signed)
A: urine has not concentrated with fluid restriction. No evidence of volume overload.  P: Await repeat serum sodium and Urine Na/Osm Will likely decrease cymbalta dose to 60 mg once daily.

## 2013-03-13 NOTE — Patient Instructions (Addendum)
Thank you for coming back today. Your urine has not concentrated as I would have liked, so I suspect your serum sodium is still low.  I will call once I have the serum sodium results. Continue to fluid restrict. Continue to not take lasix. Anticipate decreasing cymbalta to 30 mg BID. When I call I'll let you know for sure.   Dr. Armen Pickup

## 2013-03-13 NOTE — Progress Notes (Signed)
Subjective:     Patient ID: [name], female   DOB: 08-Apr-1946, 67 y.o.   MRN: [id number]  HPI  67 year-old female presents with her husband for follow up to discuss the following:  #1 hyponatremia: patient had hyponatremia on routine lab work for preop evaluation. Her initial sodium was 126. She is compliant with fluid restriction. She drinks one to two 12 ounce coffees in the morning and an additional 24-36 ounces of soda (caffeine free) during the day. She has also been compliant with not taking Lasix. Regarding Cymbalta dose, the dose was increased from 60 mg daily to 60 mg in the morning 30 mg in the evening 8 months ago. And  Review of Systems As per HPI     Objective:   Physical Exam BP 116/87  Pulse 128  Temp(Src) 98 F (36.7 C) (Oral)  Wt 219 lb (99.338 kg)  BMI 41.03 kg/m2 General appearance: alert, cooperative and no distress Lungs: clear to auscultation bilaterally Abdomen: obese, no masses or fluid wave  Extremities: edema trace LE edema b/l.     Assessment and Plan:

## 2013-03-15 LAB — ARGININE VASOPRESSIN HORMONE: Arginine Vasopressin: <1 pg/mL — ABNORMAL LOW

## 2013-03-19 ENCOUNTER — Telehealth: Payer: Self-pay | Admitting: Family Medicine

## 2013-03-19 DIAGNOSIS — E871 Hypo-osmolality and hyponatremia: Secondary | ICD-10-CM

## 2013-03-19 NOTE — Telephone Encounter (Signed)
Called patient, spoke to [name].  Serum sodium a bit higher.  Would like to decrease cymbalta, ? SIADH, [name] fearful that decrease would precipitate acute episode of depression. Patient with headache today. Still fluid restricting.   Plan: Increase fluid intake slightly.  F/u tomorrow or Friday for repeat BMP F/u with me next week. Early in the week (early in the morning or early afternoon)  If sodium still low. Will give fluid bolus of isotonic saline, NS 1-2 L.   Agrees with plan and voices understanding.

## 2013-03-19 NOTE — Assessment & Plan Note (Addendum)
Suppressed ADH (<1.0) . Appropriate response to hyponatremia. ? Hypovolemic hyponatremia.  Since fluid restriction:  Slight increase in serum sodium (125> 127), dilute urine (sp grav 1.010) , inappropriately concentrated urine Osm (256 >137) and urine sodium (16).  ? Hypovoemia vs renal salt wasting (due to diuretic rx (not taking lasix), adrenal insufficiency or cerebral salt wasting).  Patient with stable BP. Elevated HR.  P:  Continue fluid restriction F/u BMP within the next week prior to f/u appt.  Consider isotonic saline bolus of 1-2 L especially if still tachy.

## 2013-03-21 ENCOUNTER — Other Ambulatory Visit: Payer: Medicare Other

## 2013-03-21 DIAGNOSIS — E871 Hypo-osmolality and hyponatremia: Secondary | ICD-10-CM | POA: Diagnosis not present

## 2013-03-21 NOTE — Progress Notes (Signed)
BMP DONE TODAY [name] 

## 2013-03-22 LAB — BASIC METABOLIC PANEL
BUN: 13 mg/dL (ref 6–23)
CO2: 23 mEq/L (ref 19–32)
Calcium: 8.6 mg/dL (ref 8.4–10.5)
Glucose, Bld: 89 mg/dL (ref 70–99)

## 2013-03-24 ENCOUNTER — Telehealth: Payer: Self-pay | Admitting: Family Medicine

## 2013-03-24 DIAGNOSIS — E871 Hypo-osmolality and hyponatremia: Secondary | ICD-10-CM

## 2013-03-24 NOTE — Telephone Encounter (Signed)
Called patient. Left VM. Sodium is normal. Continue fluid restriction. Will recheck UA  (sp grav) and BMP at f/u visit. If repeat sodium normal will give Dr. Ranell Patrick the go ahead for shoulder.

## 2013-03-24 NOTE — Assessment & Plan Note (Addendum)
A: normal sodium on repeat BMP. P: Continue fluid restriction. Will recheck UA  (sp grav) and BMP at f/u visit. If repeat sodium normal will give Dr. Ranell Patrick the go ahead for shoulder.

## 2013-03-27 ENCOUNTER — Encounter: Payer: Self-pay | Admitting: Family Medicine

## 2013-03-27 ENCOUNTER — Ambulatory Visit (INDEPENDENT_AMBULATORY_CARE_PROVIDER_SITE_OTHER): Payer: Medicare Other | Admitting: Family Medicine

## 2013-03-27 VITALS — BP 118/80 | HR 84 | Temp 97.5°F | Wt 212.0 lb

## 2013-03-27 DIAGNOSIS — E871 Hypo-osmolality and hyponatremia: Secondary | ICD-10-CM

## 2013-03-27 LAB — POCT UA - MICROSCOPIC ONLY

## 2013-03-27 LAB — POCT URINALYSIS DIPSTICK
Bilirubin, UA: NEGATIVE
Blood, UA: NEGATIVE
Nitrite, UA: POSITIVE
Protein, UA: NEGATIVE
pH, UA: 6.5

## 2013-03-27 NOTE — Patient Instructions (Addendum)
Thank you for coming in today. Last sodium is normal, vitals normal. Continue current intake of fluid.  Hold Aspirin 5 days before surgery. I will see you for post op f/u.   Dr. Armen Pickup

## 2013-03-28 ENCOUNTER — Ambulatory Visit: Payer: Medicare Other | Admitting: Family Medicine

## 2013-04-01 NOTE — Assessment & Plan Note (Signed)
A: last sodium normal on fluid restriction.  P: Continue fluid restriction.  Patient may proceed with shoulder surgery.

## 2013-04-01 NOTE — Progress Notes (Signed)
Subjective:     Patient ID: [name], female   DOB: Jul 12, 1946, 68 y.o.   MRN: [id number]  HPI 67 yo female presents with her husband for follow visit to discuss the following:  #1 hyponatremia: Patient had hyponatremia on routine blood work for preop evaluation. Her initial sodium was 126. I placed her on a fluid restriction for 24-36 ounces of fluid daily. I also asked her to discontinue Lasix. Over the past 4  weeks her repeat sodium was 125, then 127 most recently 135. During this time there is no expect the concentration of her urine. Today she has no complaints except for right shoulder pain. She denies headache and edema.  Review of Systems As per HPI     Objective:   Physical Exam BP 118/80  Pulse 84  Temp(Src) 97.5 F (36.4 C) (Oral)  Wt 212 lb (96.163 kg)  BMI 39.72 kg/m2 General appearance: alert, cooperative and no distress Lungs: clear to auscultation bilaterally Heart: regular rate and rhythm, S1, S2 normal, no murmur, click, rub or gallop Extremities: extremities normal, atraumatic, no cyanosis or edema     Chemistry      Component Value Date/Time   NA 135 03/21/2013 1356   K 4.1 03/21/2013 1356   CL 103 03/21/2013 1356   CO2 23 03/21/2013 1356   BUN 13 03/21/2013 1356   CREATININE 0.87 03/21/2013 1356   CREATININE 0.76 08/06/2010 0601      Component Value Date/Time   CALCIUM 8.6 03/21/2013 1356   ALKPHOS 79 02/26/2013 1537   AST 22 02/26/2013 1537   ALT 13 02/26/2013 1537   BILITOT 0.4 02/26/2013 1537         Assessment and Plan:

## 2013-04-01 NOTE — Addendum Note (Signed)
Addended by: Dessa Phi on: 04/01/2013 06:05 PM   Modules accepted: Orders

## 2013-05-07 DIAGNOSIS — S42209A Unspecified fracture of upper end of unspecified humerus, initial encounter for closed fracture: Secondary | ICD-10-CM | POA: Diagnosis not present

## 2013-05-07 NOTE — H&P (Signed)
[name] is an 67 y.o. female.    Chief Complaint: right shoulder pain  HPI: Pt is a 67 y.o. female complaining of right shoulder pain for multiple months. Pain had continually increased since the beginning. X-rays in the clinic show 4 part humerus fracture with displacement. Various options are discussed with the patient. Risks, benefits and expectations were discussed with the patient. Patient understand the risks, benefits and expectations and wishes to proceed with surgery.   PCP:  Lora Paula, MD  D/C Plans:  Home with HHPT  PMH: Past Medical History  Diagnosis Date  . Headache(784.0)   . IBS (irritable bowel syndrome)     PSH: Past Surgical History  Procedure Laterality Date  . Stomach surgery      1/3 resection for obstruction  . Spine surgery  2005    Lumbar spinal stenosis   . Gastrectomy  1994    Due to ulcer, required multiple revisions.   . Orif ankle fracture Right 2004    Social History:  reports that she has never smoked. She does not have any smokeless tobacco history on file. She reports that she does not drink alcohol or use illicit drugs.  Allergies:  Allergies  Allergen Reactions  . Bupropion Hcl     REACTION: seizure  . Azithromycin Rash    REACTION: rash  . Erythromycin Rash    REACTION: rash    Medications: No current facility-administered medications for this encounter.   Current Outpatient Prescriptions  Medication Sig Dispense Refill  . aspirin 81 MG EC tablet Take 81 mg by mouth daily.        . Cholecalciferol (VITAMIN D3) 2000 UNIT capsule Take 2,000 Units by mouth at bedtime.        . clonazePAM (KLONOPIN) 1 MG tablet Take 1 mg by mouth 2 (two) times daily. Per Dr. Evelene Croon      . Coenzyme Q10 (CO Q-10) 300 MG CAPS Take 1 capsule by mouth daily.        . DULoxetine (CYMBALTA) 60 MG capsule Take 30 mg by mouth 3 (three) times daily. Per Dr. Evelene Croon      . fluticasone (FLONASE) 50 MCG/ACT nasal spray Place 2 sprays into the nose  daily.  16 g  5  . frovatriptan (FROVA) 2.5 MG tablet Take 2.5 mg by mouth as needed. If recurs, may repeat after 2 hours. Max of 3 tabs in 24 hours.  Per Dr. Conrad [place]       . levothyroxine (SYNTHROID, LEVOTHROID) 112 MCG tablet TAKE 1 TABLET (112 MCG TOTAL) BY MOUTH DAILY.  90 tablet  3  . meloxicam (MOBIC) 7.5 MG tablet       . Multiple Vitamins-Minerals (CENTRUM SILVER PO) Take by mouth.        . Omega-3 Fatty Acids 500 MG CAPS Take by mouth.        . ondansetron (ZOFRAN) 4 MG tablet Take 4 mg by mouth every 8 (eight) hours as needed. for migraine per Dr. Loreta Ave      . OVER THE COUNTER MEDICATION Tumeric 490 mg BID      . oxycodone (OXY-IR) 5 MG capsule Take 5 mg by mouth every 4 (four) hours as needed. For pain. Per Dr. Ethelene Hal      . oxyCODONE (OXYCONTIN) 20 MG 12 hr tablet Take 20 mg by mouth 4 (four) times daily. Per Dr. Conrad [place]      . pantoprazole (PROTONIX) 40 MG tablet TAKE 1 TABLET BY MOUTH EVERY  DAY  90 tablet  3  . pregabalin (LYRICA) 100 MG capsule Take 100 mg by mouth 5 (five) times daily. Per Dr. Celedonio Miyamoto      . QUEtiapine (SEROQUEL) 50 MG tablet Take 50 mg by mouth at bedtime.        . ramelteon (ROZEREM) 8 MG tablet Take 8 mg by mouth at bedtime. Per Dr. Evelene Croon       . S-Adenosylmethionine (SAM-E) 200 MG TABS Take 2 tablets by mouth daily. Per patient      . tiZANidine (ZANAFLEX) 4 MG tablet Take 2 mg by mouth 2 (two) times daily. 1/2 tablet every morning, 1/2 tablet at supper per Dr. Conrad [place]        No results found for this or any previous visit (from the past 48 hour(s)). No results found.  ROS: Pain with rom of the right upper extremity  Physical Exam: BP:   138/86  ;  HR:   72  ; Resp:   14  : Alert and oriented 67 y.o. female in no acute distress Cranial nerves 2-12 intact Cervical spine: full rom with no tenderness, nv intact distally Chest: active breath sounds bilaterally, occasional rhonchi bilateral lungs Heart: regular rate and rhythm, no murmur Abd: non  tender non distended with active bowel sounds Hip is stable with rom  Moderate to severe pain and weakness with rom nv intact distally No rashes or edema  Assessment/Plan Assessment: right proximal humerus fracture   Plan: Patient will undergo a right shoulder hemi arthroplasty vs reverse total shoulder by Dr. Ranell Patrick at [facility]. Risks benefits and expectations were discussed with the patient. Patient understand risks, benefits and expectations and wishes to proceed.

## 2013-05-08 ENCOUNTER — Encounter (HOSPITAL_COMMUNITY)
Admission: RE | Admit: 2013-05-08 | Discharge: 2013-05-08 | Disposition: A | Discharge status: Home / Self Care | Payer: Medicare Other | Source: Ambulatory Visit | Attending: Orthopedic Surgery | Admitting: Orthopedic Surgery

## 2013-05-08 ENCOUNTER — Encounter (HOSPITAL_COMMUNITY): Payer: Self-pay

## 2013-05-08 ENCOUNTER — Other Ambulatory Visit (HOSPITAL_COMMUNITY): Payer: Self-pay | Admitting: *Deleted

## 2013-05-08 DIAGNOSIS — Z01812 Encounter for preprocedural laboratory examination: Secondary | ICD-10-CM | POA: Diagnosis not present

## 2013-05-08 DIAGNOSIS — Z01818 Encounter for other preprocedural examination: Secondary | ICD-10-CM | POA: Diagnosis not present

## 2013-05-08 HISTORY — DX: Hypothyroidism, unspecified: E03.9

## 2013-05-08 HISTORY — DX: Unspecified fracture of shaft of humerus, unspecified arm, initial encounter for closed fracture: S42.309A

## 2013-05-08 HISTORY — DX: Unspecified osteoarthritis, unspecified site: M19.90

## 2013-05-08 HISTORY — DX: Depression, unspecified: F32.A

## 2013-05-08 HISTORY — DX: Anxiety disorder, unspecified: F41.9

## 2013-05-08 HISTORY — DX: Shortness of breath: R06.02

## 2013-05-08 HISTORY — DX: Malignant (primary) neoplasm, unspecified: C80.1

## 2013-05-08 HISTORY — DX: Gastro-esophageal reflux disease without esophagitis: K21.9

## 2013-05-08 HISTORY — DX: Major depressive disorder, single episode, unspecified: F32.9

## 2013-05-08 HISTORY — DX: Unspecified convulsions: R56.9

## 2013-05-08 HISTORY — DX: Anemia, unspecified: D64.9

## 2013-05-08 HISTORY — DX: Mental disorder, not otherwise specified: F99

## 2013-05-08 LAB — SURGICAL PCR SCREEN: MRSA, PCR: NEGATIVE

## 2013-05-08 LAB — BASIC METABOLIC PANEL
BUN: 15 mg/dL (ref 6–23)
CO2: 20 mEq/L (ref 19–32)
Chloride: 106 mEq/L (ref 96–112)
GFR calc non Af Amer: 45 mL/min — ABNORMAL LOW (ref >90)
Glucose, Bld: 106 mg/dL — ABNORMAL HIGH (ref 70–99)
Potassium: 4.1 mEq/L (ref 3.5–5.1)
Sodium: 138 mEq/L (ref 135–145)

## 2013-05-08 LAB — CBC
HCT: 37.4 % (ref 36.0–46.0)
Hemoglobin: 12.9 g/dL (ref 12.0–15.0)
MCH: 27.4 pg (ref 26.0–34.0)
MCV: 79.4 fL (ref 78.0–100.0)
RBC: 4.71 MIL/uL (ref 3.87–5.11)

## 2013-05-08 NOTE — Pre-Procedure Instructions (Signed)
[name]  05/08/2013   Your procedure is scheduled on:  May 16, 2013 at 7:30 AM  Report to [facility] at 5:30 AM.  Call this number if you have problems the morning of surgery: [phone]   Remember:   Do not eat food or drink liquids after midnight.   Take these medicines the morning of surgery with A SIP OF WATER: DULoxetine (CYMBALTA), clonazePAM (KLONOPIN),   fluticasone (FLONASE), levothyroxine (SYNTHROID, LEVOTHROID),  oxyCODONE (OXYCONTIN), pantoprazole (PROTONIX)  Stop all Vitamins, Herbal Medications, Fish Oil, Aspirin, Non-steroidals (Mobic, Ibuprofen, Motrin, Aleve, Naproxen, etc) 7 days before surgery.        Do not wear jewelry, make-up or nail polish.  Do not wear lotions, powders, or perfumes. You may wear deodorant.  Do not shave 48 hours prior to surgery.   Do not bring valuables to the hospital.  [facility] is not responsible                   for any belongings or valuables.  Contacts, dentures or bridgework may not be worn into surgery.  Leave suitcase in the car. After surgery it may be brought to your room.  For patients admitted to the hospital, checkout time is 11:00 AM the day of  discharge.    Special Instructions: Shower using CHG 2 nights before surgery and the night before surgery.  If you shower the day of surgery use CHG.  Use special wash - you have one bottle of CHG for all showers.  You should use approximately 1/3 of the bottle for each shower.   Please read over the following fact sheets that you were given: Pain Booklet, Coughing and Deep Breathing, Blood Transfusion Information, MRSA Information and Surgical Site Infection Prevention

## 2013-05-09 NOTE — Progress Notes (Signed)
Anesthesia Chart Review:  Patient is a 67 year old female scheduled for right shoulder hemiarthroplasty versus reverse total shoulder arthroplasty on 05/16/13 by Dr. Ranell Patrick.  History includes non-smoker, obesity, IBS, hypothyroidism, depression, anxiety, history of "seritonin seizure", GERD, anemia, skin cancer, PUD s/p partial gastrectomy '94, anemia, lumbar surgery, breast augmentation, hysterectomy, tonsillectomy, cholecystectomy.  She receives primary care thru [facility] and was cleared for this procedure by Dr. Dessa Phi.  (It was initially postponed in 02/2013 due to hyponatremia.)  EKG on 02/26/13 showed NSR, LAD, RSR prime or QR pattern suggestive of RV conduction delay, T wave abnormality, consider anterior ischemia.  She has multiple prior EKG tracing in Epic and Muse dating back to at least 2005.  I think her EKG appears similar to her EKG from 10/17/03.  (She is not currently followed by cardiology, but was evaluated by [facility] back in 07/2007 for peripheral edema with mention of prior negative stress Cardiolite--but many years ago.)  Echo on 03/05/09 showed: - Normal LV systolic function with EF 60-65%. Mild LV hypertrophy. Moderate diastolic dysfunction. Mild pulmonary hypertension may be secondary to elevated LV filling pressure (PA systolic pressure is estimated at 48-53 MmHg). The RV is mildly dilated with normal systolic function. Trivial MR/TR.  Preoperative labs noted. Na 138.  Anticipate that she can proceed as planned.  Velna Ochs [facility]/Anesthesiology Phone [phone] 05/09/2013 3:24 PM

## 2013-05-15 MED ORDER — CEFAZOLIN SODIUM-DEXTROSE 2-3 GM-% IV SOLR
2.0000 g | INTRAVENOUS | Status: AC
  Administered 2013-05-16: 2 g via INTRAVENOUS
  Filled 2013-05-15: qty 50

## 2013-05-16 ENCOUNTER — Encounter (HOSPITAL_COMMUNITY)
Admission: RE | Disposition: A | Discharge status: Home / Self Care | Payer: Self-pay | Source: Ambulatory Visit | Attending: Orthopedic Surgery

## 2013-05-16 ENCOUNTER — Encounter (HOSPITAL_COMMUNITY): Payer: Self-pay

## 2013-05-16 ENCOUNTER — Encounter (HOSPITAL_COMMUNITY): Payer: Self-pay | Admitting: Vascular Surgery

## 2013-05-16 ENCOUNTER — Inpatient Hospital Stay (HOSPITAL_COMMUNITY): Payer: Medicare Other | Admitting: Anesthesiology

## 2013-05-16 ENCOUNTER — Inpatient Hospital Stay (HOSPITAL_COMMUNITY): Payer: Medicare Other

## 2013-05-16 ENCOUNTER — Inpatient Hospital Stay (HOSPITAL_COMMUNITY)
Admission: RE | Admit: 2013-05-16 | Discharge: 2013-05-20 | DRG: 483 | Disposition: A | Discharge status: Home / Self Care | Payer: Medicare Other | Source: Ambulatory Visit | Attending: Orthopedic Surgery | Admitting: Orthopedic Surgery

## 2013-05-16 DIAGNOSIS — K589 Irritable bowel syndrome without diarrhea: Secondary | ICD-10-CM | POA: Diagnosis not present

## 2013-05-16 DIAGNOSIS — Z79899 Other long term (current) drug therapy: Secondary | ICD-10-CM

## 2013-05-16 DIAGNOSIS — E039 Hypothyroidism, unspecified: Secondary | ICD-10-CM | POA: Diagnosis present

## 2013-05-16 DIAGNOSIS — G8918 Other acute postprocedural pain: Secondary | ICD-10-CM | POA: Diagnosis not present

## 2013-05-16 DIAGNOSIS — M25519 Pain in unspecified shoulder: Secondary | ICD-10-CM | POA: Diagnosis not present

## 2013-05-16 DIAGNOSIS — D62 Acute posthemorrhagic anemia: Secondary | ICD-10-CM | POA: Diagnosis not present

## 2013-05-16 DIAGNOSIS — Z7982 Long term (current) use of aspirin: Secondary | ICD-10-CM | POA: Diagnosis not present

## 2013-05-16 DIAGNOSIS — IMO0002 Reserved for concepts with insufficient information to code with codable children: Principal | ICD-10-CM | POA: Diagnosis present

## 2013-05-16 DIAGNOSIS — Z471 Aftercare following joint replacement surgery: Secondary | ICD-10-CM | POA: Diagnosis not present

## 2013-05-16 DIAGNOSIS — Z96619 Presence of unspecified artificial shoulder joint: Secondary | ICD-10-CM | POA: Diagnosis not present

## 2013-05-16 DIAGNOSIS — S42213A Unspecified displaced fracture of surgical neck of unspecified humerus, initial encounter for closed fracture: Secondary | ICD-10-CM | POA: Diagnosis not present

## 2013-05-16 DIAGNOSIS — I509 Heart failure, unspecified: Secondary | ICD-10-CM | POA: Diagnosis not present

## 2013-05-16 DIAGNOSIS — K219 Gastro-esophageal reflux disease without esophagitis: Secondary | ICD-10-CM | POA: Diagnosis not present

## 2013-05-16 HISTORY — PX: REVERSE SHOULDER ARTHROPLASTY: SHX5054

## 2013-05-16 LAB — PREPARE RBC (CROSSMATCH)

## 2013-05-16 LAB — TYPE AND SCREEN: Antibody Screen: NEGATIVE

## 2013-05-16 SURGERY — ARTHROPLASTY, SHOULDER, TOTAL, REVERSE
Anesthesia: General | Site: Shoulder | Laterality: Right | Wound class: Clean

## 2013-05-16 MED ORDER — VITAMIN D3 50 MCG (2000 UT) PO CAPS: 2000.0000 [IU] | ORAL_CAPSULE | Freq: Every day | ORAL | Status: DC

## 2013-05-16 MED ORDER — METHOCARBAMOL 500 MG PO TABS
500.0000 mg | ORAL_TABLET | Freq: Four times a day (QID) | ORAL | Status: DC | PRN
  Administered 2013-05-16 – 2013-05-18 (×5): 500 mg via ORAL
  Filled 2013-05-16 (×6): qty 1

## 2013-05-16 MED ORDER — HYDROMORPHONE HCL PF 1 MG/ML IJ SOLN
INTRAMUSCULAR | Status: AC
  Administered 2013-05-16: 13:00:00
  Filled 2013-05-16: qty 1

## 2013-05-16 MED ORDER — FUROSEMIDE 20 MG PO TABS: 20.0000 mg | ORAL_TABLET | Freq: Every day | ORAL | Status: DC | PRN

## 2013-05-16 MED ORDER — ROCURONIUM BROMIDE 100 MG/10ML IV SOLN
INTRAVENOUS | Status: DC | PRN
  Administered 2013-05-16: 40 mg via INTRAVENOUS

## 2013-05-16 MED ORDER — FENTANYL CITRATE 0.05 MG/ML IJ SOLN
INTRAMUSCULAR | Status: DC | PRN
  Administered 2013-05-16 (×4): 50 ug via INTRAVENOUS

## 2013-05-16 MED ORDER — OXYCODONE HCL ER 20 MG PO T12A
20.0000 mg | EXTENDED_RELEASE_TABLET | Freq: Four times a day (QID) | ORAL | Status: DC
  Administered 2013-05-16 (×3): 20 mg via ORAL
  Filled 2013-05-16 (×4): qty 1

## 2013-05-16 MED ORDER — ASPIRIN EC 81 MG PO TBEC
81.0000 mg | DELAYED_RELEASE_TABLET | Freq: Every day | ORAL | Status: DC
  Administered 2013-05-17 – 2013-05-20 (×4): 81 mg via ORAL
  Filled 2013-05-16 (×4): qty 1

## 2013-05-16 MED ORDER — OXYCODONE HCL 5 MG PO TABS
15.0000 mg | ORAL_TABLET | ORAL | Status: DC | PRN
  Administered 2013-05-16: 15 mg via ORAL
  Administered 2013-05-16: 10 mg via ORAL
  Administered 2013-05-17 – 2013-05-18 (×5): 15 mg via ORAL
  Administered 2013-05-18 (×2): 10 mg via ORAL
  Administered 2013-05-18: 5 mg via ORAL
  Administered 2013-05-18: 15 mg via ORAL
  Administered 2013-05-19 (×4): 10 mg via ORAL
  Administered 2013-05-20 (×2): 15 mg via ORAL
  Administered 2013-05-20: 10 mg via ORAL
  Filled 2013-05-16 (×4): qty 3
  Filled 2013-05-16 (×2): qty 2
  Filled 2013-05-16 (×2): qty 3
  Filled 2013-05-16: qty 2
  Filled 2013-05-16: qty 3
  Filled 2013-05-16 (×5): qty 2
  Filled 2013-05-16: qty 3
  Filled 2013-05-16: qty 1
  Filled 2013-05-16: qty 3

## 2013-05-16 MED ORDER — SODIUM CHLORIDE 0.9 % IV SOLN
INTRAVENOUS | Status: DC | PRN
  Administered 2013-05-16: 10:00:00 via INTRAVENOUS

## 2013-05-16 MED ORDER — MORPHINE SULFATE 2 MG/ML IJ SOLN
1.0000 mg | INTRAMUSCULAR | Status: DC | PRN
  Administered 2013-05-16: 1 mg via INTRAVENOUS
  Filled 2013-05-16: qty 1

## 2013-05-16 MED ORDER — SODIUM CHLORIDE 0.9 % IR SOLN
Status: DC | PRN
  Administered 2013-05-16: 1000 mL

## 2013-05-16 MED ORDER — ASPIRIN 81 MG PO TBEC: 81.0000 mg | DELAYED_RELEASE_TABLET | Freq: Every day | ORAL | Status: DC

## 2013-05-16 MED ORDER — ACETAMINOPHEN 650 MG RE SUPP: 650.0000 mg | Freq: Four times a day (QID) | RECTAL | Status: DC | PRN

## 2013-05-16 MED ORDER — CLONAZEPAM 1 MG PO TABS
1.0000 mg | ORAL_TABLET | Freq: Two times a day (BID) | ORAL | Status: DC
  Administered 2013-05-16 – 2013-05-20 (×8): 1 mg via ORAL
  Filled 2013-05-16 (×8): qty 1

## 2013-05-16 MED ORDER — PROPOFOL 10 MG/ML IV BOLUS
INTRAVENOUS | Status: DC | PRN
  Administered 2013-05-16: 140 mg via INTRAVENOUS

## 2013-05-16 MED ORDER — CEFAZOLIN SODIUM-DEXTROSE 2-3 GM-% IV SOLR
2.0000 g | Freq: Four times a day (QID) | INTRAVENOUS | Status: AC
  Administered 2013-05-16 – 2013-05-17 (×3): 2 g via INTRAVENOUS
  Filled 2013-05-16 (×4): qty 50

## 2013-05-16 MED ORDER — GLYCOPYRROLATE 0.2 MG/ML IJ SOLN
INTRAMUSCULAR | Status: DC | PRN
  Administered 2013-05-16: 0.4 mg via INTRAVENOUS

## 2013-05-16 MED ORDER — COENZYME Q10 300 MG PO CAPS: 1.0000 | ORAL_CAPSULE | Freq: Every day | ORAL | Status: DC

## 2013-05-16 MED ORDER — OXYCODONE HCL 20 MG PO TB12: 20.0000 mg | ORAL_TABLET | Freq: Four times a day (QID) | ORAL | Status: DC

## 2013-05-16 MED ORDER — BUPIVACAINE-EPINEPHRINE PF 0.5-1:200000 % IJ SOLN
INTRAMUSCULAR | Status: DC | PRN
  Administered 2013-05-16: 20 mL

## 2013-05-16 MED ORDER — RAMELTEON 8 MG PO TABS
8.0000 mg | ORAL_TABLET | Freq: Every day | ORAL | Status: DC
  Administered 2013-05-16 – 2013-05-19 (×4): 8 mg via ORAL
  Filled 2013-05-16 (×5): qty 1

## 2013-05-16 MED ORDER — ONDANSETRON HCL 4 MG PO TABS: 4.0000 mg | ORAL_TABLET | Freq: Three times a day (TID) | ORAL | Status: DC | PRN

## 2013-05-16 MED ORDER — SODIUM CHLORIDE 0.9 % IV SOLN
INTRAVENOUS | Status: DC
  Administered 2013-05-17 – 2013-05-20 (×4): via INTRAVENOUS

## 2013-05-16 MED ORDER — METOCLOPRAMIDE HCL 5 MG/ML IJ SOLN: 5.0000 mg | Freq: Three times a day (TID) | INTRAMUSCULAR | Status: DC | PRN

## 2013-05-16 MED ORDER — LEVOTHYROXINE SODIUM 112 MCG PO TABS
112.0000 ug | ORAL_TABLET | Freq: Every day | ORAL | Status: DC
  Administered 2013-05-17 – 2013-05-20 (×4): 112 ug via ORAL
  Filled 2013-05-16 (×6): qty 1

## 2013-05-16 MED ORDER — ALBUMIN HUMAN 5 % IV SOLN
INTRAVENOUS | Status: DC | PRN
  Administered 2013-05-16: 09:00:00 via INTRAVENOUS

## 2013-05-16 MED ORDER — CHLORHEXIDINE GLUCONATE 4 % EX LIQD: 60.0000 mL | Freq: Once | CUTANEOUS | Status: DC

## 2013-05-16 MED ORDER — LIDOCAINE HCL (CARDIAC) 20 MG/ML IV SOLN
INTRAVENOUS | Status: DC | PRN
  Administered 2013-05-16: 65 mg via INTRAVENOUS

## 2013-05-16 MED ORDER — METHOCARBAMOL 500 MG PO TABS
ORAL_TABLET | ORAL | Status: AC
  Administered 2013-05-16: 13:00:00
  Filled 2013-05-16: qty 1

## 2013-05-16 MED ORDER — EPHEDRINE SULFATE 50 MG/ML IJ SOLN
INTRAMUSCULAR | Status: DC | PRN
  Administered 2013-05-16: 10 mg via INTRAVENOUS
  Administered 2013-05-16 (×2): 5 mg via INTRAVENOUS

## 2013-05-16 MED ORDER — SODIUM CHLORIDE 0.9 % IV SOLN
10.0000 mg | INTRAVENOUS | Status: DC | PRN
  Administered 2013-05-16: 10 ug/min via INTRAVENOUS

## 2013-05-16 MED ORDER — DULOXETINE HCL 30 MG PO CPEP
30.0000 mg | ORAL_CAPSULE | Freq: Every day | ORAL | Status: DC
  Administered 2013-05-16 – 2013-05-19 (×4): 30 mg via ORAL
  Filled 2013-05-16 (×6): qty 1

## 2013-05-16 MED ORDER — OXYCODONE HCL 5 MG PO TABS
5.0000 mg | ORAL_TABLET | Freq: Once | ORAL | Status: AC | PRN
  Administered 2013-05-16: 5 mg via ORAL

## 2013-05-16 MED ORDER — OXYCODONE HCL 5 MG PO TABS
ORAL_TABLET | ORAL | Status: AC
  Administered 2013-05-16: 13:00:00
  Filled 2013-05-16: qty 1

## 2013-05-16 MED ORDER — FLUTICASONE PROPIONATE 50 MCG/ACT NA SUSP
2.0000 | Freq: Every day | NASAL | Status: DC
  Administered 2013-05-18 – 2013-05-20 (×3): 2 via NASAL
  Filled 2013-05-16: qty 16

## 2013-05-16 MED ORDER — BUPIVACAINE-EPINEPHRINE 0.25% -1:200000 IJ SOLN
INTRAMUSCULAR | Status: DC | PRN
  Administered 2013-05-16: 8 mL

## 2013-05-16 MED ORDER — VITAMIN D3 25 MCG (1000 UNIT) PO TABS
2000.0000 [IU] | ORAL_TABLET | Freq: Every day | ORAL | Status: DC
  Administered 2013-05-16 – 2013-05-19 (×4): 2000 [IU] via ORAL
  Filled 2013-05-16 (×5): qty 2

## 2013-05-16 MED ORDER — ONDANSETRON HCL 4 MG/2ML IJ SOLN
INTRAMUSCULAR | Status: DC | PRN
  Administered 2013-05-16: 4 mg via INTRAVENOUS

## 2013-05-16 MED ORDER — ONDANSETRON HCL 4 MG PO TABS: 4.0000 mg | ORAL_TABLET | Freq: Four times a day (QID) | ORAL | Status: DC | PRN

## 2013-05-16 MED ORDER — NEOSTIGMINE METHYLSULFATE 1 MG/ML IJ SOLN
INTRAMUSCULAR | Status: DC | PRN
  Administered 2013-05-16: 3 mg via INTRAVENOUS

## 2013-05-16 MED ORDER — DULOXETINE HCL 60 MG PO CPEP
60.0000 mg | ORAL_CAPSULE | Freq: Every day | ORAL | Status: DC
  Administered 2013-05-17 – 2013-05-20 (×4): 60 mg via ORAL
  Filled 2013-05-16 (×4): qty 1

## 2013-05-16 MED ORDER — QUETIAPINE FUMARATE 50 MG PO TABS
50.0000 mg | ORAL_TABLET | Freq: Every day | ORAL | Status: DC
  Administered 2013-05-16 – 2013-05-19 (×4): 50 mg via ORAL
  Filled 2013-05-16 (×5): qty 1

## 2013-05-16 MED ORDER — METHOCARBAMOL 100 MG/ML IJ SOLN
500.0000 mg | Freq: Four times a day (QID) | INTRAVENOUS | Status: DC | PRN
  Filled 2013-05-16: qty 5

## 2013-05-16 MED ORDER — OXYCODONE HCL 5 MG/5ML PO SOLN: 5.0000 mg | Freq: Once | ORAL | Status: AC | PRN

## 2013-05-16 MED ORDER — PANTOPRAZOLE SODIUM 40 MG PO TBEC
40.0000 mg | DELAYED_RELEASE_TABLET | Freq: Every day | ORAL | Status: DC
  Administered 2013-05-17 – 2013-05-20 (×4): 40 mg via ORAL
  Filled 2013-05-16 (×4): qty 1

## 2013-05-16 MED ORDER — DULOXETINE HCL 30 MG PO CPEP: 30.0000 mg | ORAL_CAPSULE | Freq: Two times a day (BID) | ORAL | Status: DC

## 2013-05-16 MED ORDER — ONDANSETRON HCL 4 MG/2ML IJ SOLN: 4.0000 mg | Freq: Four times a day (QID) | INTRAMUSCULAR | Status: DC | PRN

## 2013-05-16 MED ORDER — BUPIVACAINE-EPINEPHRINE PF 0.25-1:200000 % IJ SOLN
INTRAMUSCULAR | Status: AC
  Filled 2013-05-16: qty 30

## 2013-05-16 MED ORDER — HYDROMORPHONE HCL PF 1 MG/ML IJ SOLN
0.2500 mg | INTRAMUSCULAR | Status: DC | PRN
  Administered 2013-05-16 (×3): 0.25 mg via INTRAVENOUS
  Administered 2013-05-16: 0.35 mg via INTRAVENOUS
  Administered 2013-05-16: 0.25 mg via INTRAVENOUS

## 2013-05-16 MED ORDER — MIDAZOLAM HCL 5 MG/5ML IJ SOLN
INTRAMUSCULAR | Status: DC | PRN
  Administered 2013-05-16: 1 mg via INTRAVENOUS

## 2013-05-16 MED ORDER — MENTHOL 3 MG MT LOZG: 1.0000 | LOZENGE | OROMUCOSAL | Status: DC | PRN

## 2013-05-16 MED ORDER — ACETAMINOPHEN 325 MG PO TABS
650.0000 mg | ORAL_TABLET | Freq: Four times a day (QID) | ORAL | Status: DC | PRN
  Filled 2013-05-16: qty 2

## 2013-05-16 MED ORDER — LACTATED RINGERS IV SOLN
INTRAVENOUS | Status: DC | PRN
  Administered 2013-05-16 (×2): via INTRAVENOUS

## 2013-05-16 MED ORDER — TIZANIDINE HCL 2 MG PO TABS
2.0000 mg | ORAL_TABLET | Freq: Two times a day (BID) | ORAL | Status: DC
  Administered 2013-05-17 – 2013-05-20 (×8): 2 mg via ORAL
  Filled 2013-05-16 (×12): qty 1

## 2013-05-16 MED ORDER — MELOXICAM 7.5 MG PO TABS
7.5000 mg | ORAL_TABLET | Freq: Every day | ORAL | Status: DC
  Administered 2013-05-17 – 2013-05-20 (×4): 7.5 mg via ORAL
  Filled 2013-05-16 (×4): qty 1

## 2013-05-16 MED ORDER — PHENYLEPHRINE HCL 10 MG/ML IJ SOLN
INTRAMUSCULAR | Status: DC | PRN
  Administered 2013-05-16 (×3): 80 ug via INTRAVENOUS

## 2013-05-16 MED ORDER — PHENOL 1.4 % MT LIQD: 1.0000 | OROMUCOSAL | Status: DC | PRN

## 2013-05-16 MED ORDER — SUMATRIPTAN SUCCINATE 50 MG PO TABS
50.0000 mg | ORAL_TABLET | ORAL | Status: DC | PRN
  Administered 2013-05-16 – 2013-05-17 (×2): 50 mg via ORAL
  Filled 2013-05-16 (×3): qty 1

## 2013-05-16 MED ORDER — BISACODYL 10 MG RE SUPP: 10.0000 mg | Freq: Every day | RECTAL | Status: DC | PRN

## 2013-05-16 MED ORDER — METOCLOPRAMIDE HCL 10 MG PO TABS: 5.0000 mg | ORAL_TABLET | Freq: Three times a day (TID) | ORAL | Status: DC | PRN

## 2013-05-16 MED ORDER — PREGABALIN 50 MG PO CAPS
100.0000 mg | ORAL_CAPSULE | Freq: Every day | ORAL | Status: DC
  Administered 2013-05-16 – 2013-05-20 (×17): 100 mg via ORAL
  Filled 2013-05-16 (×18): qty 2

## 2013-05-16 SURGICAL SUPPLY — 65 items
BIT DRILL 170X2.5X (BIT) IMPLANT
BIT DRL 170X2.5X (BIT) ×1
BLADE SAG 18X100X1.27 (BLADE) ×2 IMPLANT
CAPT SHOULD DELTAXTEND CEM MOD ×2 IMPLANT
CEMENT BONE DEPUY (Cement) ×2 IMPLANT
CLOTH BEACON ORANGE TIMEOUT ST (SAFETY) ×2 IMPLANT
COVER SURGICAL LIGHT HANDLE (MISCELLANEOUS) ×2 IMPLANT
DRAPE INCISE IOBAN 66X45 STRL (DRAPES) ×5 IMPLANT
DRAPE U-SHAPE 47X51 STRL (DRAPES) ×2 IMPLANT
DRAPE X-RAY CASS 24X20 (DRAPES) IMPLANT
DRILL 2.5 (BIT) ×2
DRSG ADAPTIC 3X8 NADH LF (GAUZE/BANDAGES/DRESSINGS) ×2 IMPLANT
DRSG PAD ABDOMINAL 8X10 ST (GAUZE/BANDAGES/DRESSINGS) ×2 IMPLANT
DURAPREP 26ML APPLICATOR (WOUND CARE) ×2 IMPLANT
ELECT BLADE 4.0 EZ CLEAN MEGAD (MISCELLANEOUS) ×2
ELECT NEEDLE TIP 2.8 STRL (NEEDLE) ×2 IMPLANT
ELECT REM PT RETURN 9FT ADLT (ELECTROSURGICAL) ×2
ELECTRODE BLDE 4.0 EZ CLN MEGD (MISCELLANEOUS) ×1 IMPLANT
ELECTRODE REM PT RTRN 9FT ADLT (ELECTROSURGICAL) ×1 IMPLANT
GLOVE BIOGEL PI ORTHO PRO 7.5 (GLOVE) ×1
GLOVE BIOGEL PI ORTHO PRO SZ8 (GLOVE) ×1
GLOVE ORTHO TXT STRL SZ7.5 (GLOVE) ×2 IMPLANT
GLOVE PI ORTHO PRO STRL 7.5 (GLOVE) ×1 IMPLANT
GLOVE PI ORTHO PRO STRL SZ8 (GLOVE) ×1 IMPLANT
GLOVE SURG ORTHO 8.5 STRL (GLOVE) ×3 IMPLANT
GOWN STRL NON-REIN LRG LVL3 (GOWN DISPOSABLE) ×1 IMPLANT
GOWN STRL REIN XL XLG (GOWN DISPOSABLE) ×6 IMPLANT
HANDPIECE INTERPULSE COAX TIP (DISPOSABLE)
KIT BASIN OR (CUSTOM PROCEDURE TRAY) ×2 IMPLANT
KIT ROOM TURNOVER OR (KITS) ×2 IMPLANT
MANIFOLD NEPTUNE II (INSTRUMENTS) ×2 IMPLANT
NDL 1/2 CIR MAYO (NEEDLE) ×1 IMPLANT
NDL HYPO 25GX1X1/2 BEV (NEEDLE) ×1 IMPLANT
NEEDLE 1/2 CIR MAYO (NEEDLE) ×2 IMPLANT
NEEDLE HYPO 25GX1X1/2 BEV (NEEDLE) ×2 IMPLANT
NS IRRIG 1000ML POUR BTL (IV SOLUTION) ×2 IMPLANT
PACK SHOULDER (CUSTOM PROCEDURE TRAY) ×2 IMPLANT
PAD ARMBOARD 7.5X6 YLW CONV (MISCELLANEOUS) ×4 IMPLANT
PIN GUIDE 1.2 (PIN) ×2 IMPLANT
PIN GUIDE GLENOPHERE 1.5MX300M (PIN) ×1 IMPLANT
PIN METAGLENE 2.5 (PIN) ×1 IMPLANT
SET HNDPC FAN SPRY TIP SCT (DISPOSABLE) IMPLANT
SLEEVE SURGEON STRL (DRAPES) ×1 IMPLANT
SLING ARM FOAM STRAP LRG (SOFTGOODS) ×2 IMPLANT
SLING ARM FOAM STRAP MED (SOFTGOODS) IMPLANT
SPONGE GAUZE 4X4 12PLY (GAUZE/BANDAGES/DRESSINGS) ×2 IMPLANT
SPONGE LAP 18X18 X RAY DECT (DISPOSABLE) ×2 IMPLANT
SPONGE LAP 4X18 X RAY DECT (DISPOSABLE) ×2 IMPLANT
STRIP CLOSURE SKIN 1/2X4 (GAUZE/BANDAGES/DRESSINGS) ×2 IMPLANT
SUCTION FRAZIER TIP 10 FR DISP (SUCTIONS) ×2 IMPLANT
SUT ETHILON 3 0 FSL (SUTURE) ×2 IMPLANT
SUT FIBERWIRE #2 38 T-5 BLUE (SUTURE) ×8
SUT MNCRL AB 4-0 PS2 18 (SUTURE) ×2 IMPLANT
SUT VIC AB 2-0 CT1 27 (SUTURE) ×6
SUT VIC AB 2-0 CT1 TAPERPNT 27 (SUTURE) ×3 IMPLANT
SUT VICRYL 0 CT 1 36IN (SUTURE) ×2 IMPLANT
SUTURE FIBERWR #2 38 T-5 BLUE (SUTURE) ×4 IMPLANT
SYR CONTROL 10ML LL (SYRINGE) ×2 IMPLANT
TAPE CLOTH SURG 4X10 WHT LF (GAUZE/BANDAGES/DRESSINGS) ×1 IMPLANT
TOWEL OR 17X24 6PK STRL BLUE (TOWEL DISPOSABLE) ×2 IMPLANT
TOWEL OR 17X26 10 PK STRL BLUE (TOWEL DISPOSABLE) ×2 IMPLANT
TOWER CARTRIDGE SMART MIX (DISPOSABLE) IMPLANT
TRAY FOLEY CATH 16FRSI W/METER (SET/KITS/TRAYS/PACK) IMPLANT
WATER STERILE IRR 1000ML POUR (IV SOLUTION) ×2 IMPLANT
YANKAUER SUCT BULB TIP NO VENT (SUCTIONS) ×2 IMPLANT

## 2013-05-16 NOTE — Transfer of Care (Signed)
Immediate Anesthesia Transfer of Care Note  Patient: [name]  Procedure(s) Performed: Procedure(s): RIGHT HEMI ARTHROPLASTY  VERSES  REVERSE TOTAL SHOULDER ARTHROPLASTY  (Right)  Patient Location: PACU  Anesthesia Type:General  Level of Consciousness: awake, alert  and oriented  Airway & Oxygen Therapy: Patient Spontanous Breathing and Patient connected to nasal cannula oxygen  Post-op Assessment: Report given to PACU RN, Post -op Vital signs reviewed and stable and Patient moving all extremities X 4  Post vital signs: Reviewed and stable  Complications: No apparent anesthesia complications

## 2013-05-16 NOTE — Progress Notes (Signed)
PHARMACIST - PHYSICIAN ORDER COMMUNICATION  CONCERNING: P&T Medication Policy on Herbal Medications  DESCRIPTION:  This patient's order for:  Coenzyme Q10 has been noted.  This product(s) is classified as an "herbal" or natural product. Due to a lack of definitive safety studies or FDA approval, nonstandard manufacturing practices, plus the potential risk of unknown drug-drug interactions while on inpatient medications, the Pharmacy and Therapeutics Committee does not permit the use of "herbal" or natural products of this type within [facility].   ACTION TAKEN: The pharmacy department is unable to verify this order at this time and your patient has been informed of this safety policy. Please reevaluate patient's clinical condition at discharge and address if the herbal or natural product(s) should be resumed at that time.  Lysle Pearl, PharmD, BCPS Pager # [phone] 05/16/2013 2:17 PM

## 2013-05-16 NOTE — Progress Notes (Signed)
UR COMPLETED  

## 2013-05-16 NOTE — Anesthesia Postprocedure Evaluation (Signed)
  Anesthesia Post-op Note  Patient: [name]  Procedure(s) Performed: Procedure(s): RIGHT HEMI ARTHROPLASTY  VERSES  REVERSE TOTAL SHOULDER ARTHROPLASTY  (Right)  Patient Location: PACU  Anesthesia Type:General and GA combined with regional for post-op pain  Level of Consciousness: awake and alert   Airway and Oxygen Therapy: Patient Spontanous Breathing  Post-op Pain: mild  Post-op Assessment: Post-op Vital signs reviewed  Post-op Vital Signs: stable  Complications: No apparent anesthesia complications

## 2013-05-16 NOTE — Interval H&P Note (Signed)
History and Physical Interval Note:  05/16/2013 7:29 AM  [name]  has presented today for surgery, with the diagnosis of RIGHT SHOUDLER 4 PART FRACTURE   The various methods of treatment have been discussed with the patient and family. After consideration of risks, benefits and other options for treatment, the patient has consented to  Procedure(s): RIGHT HEMI ARTHROPLASTY  VERSES  REVERSE TOTAL SHOULDER ARTHROPLASTY  (Right) as a surgical intervention .  The patient's history has been reviewed, patient examined, no change in status, stable for surgery.  I have reviewed the patient's chart and labs.  Questions were answered to the patient's satisfaction.     Seham Gardenhire,STEVEN R

## 2013-05-16 NOTE — Brief Op Note (Signed)
05/16/2013  10:13 AM  PATIENT:  [name]  66 y.o. female  PRE-OPERATIVE DIAGNOSIS:  RIGHT SHOUDLER 4 PART FRACTURE, chronic nonunion  POST-OPERATIVE DIAGNOSIS:  right shoulder four part fracture,chronic nonunion  PROCEDURE:  Right shoulder reverse total shoulder arthroplasty  SURGEON:  Surgeon(s) and Role:    * Verlee Rossetti, MD - Primary  PHYSICIAN ASSISTANT:   ASSISTANTS: Thea Gist, PA-C   ANESTHESIA:   regional and general  EBL:  Total I/O In: 600 [I.V.:250; Blood:350] Out: 400 [Blood:400]  BLOOD ADMINISTERED:none and 400 CC PRBC  DRAINS: (1) Hemovact drain(s) in the subdeltoid with  Suction Open   LOCAL MEDICATIONS USED:  MARCAINE     SPECIMEN:  No Specimen  DISPOSITION OF SPECIMEN:  N/A  COUNTS:  YES  TOURNIQUET:  * No tourniquets in log *  DICTATION: .Other Dictation: Dictation Number [id number]  PLAN OF CARE: Admit to inpatient   PATIENT DISPOSITION:  PACU - hemodynamically stable.   Delay start of Pharmacological VTE agent (>24hrs) due to surgical blood loss or risk of bleeding: not applicable

## 2013-05-16 NOTE — Anesthesia Preprocedure Evaluation (Addendum)
Anesthesia Evaluation  Patient identified by MRN, date of birth, ID band Patient awake    Reviewed: Allergy & Precautions, H&P , NPO status , Patient's Chart, lab work & pertinent test results  Airway Mallampati: II      Dental  (+) Teeth Intact and Caps   Pulmonary shortness of breath,  breath sounds clear to auscultation        Cardiovascular +CHF Rhythm:Regular Rate:Normal     Neuro/Psych  Headaches, Poorly Controlled,   Neuromuscular disease    GI/Hepatic GERD-  ,  Endo/Other  Hypothyroidism   Renal/GU      Musculoskeletal  (+) Arthritis -, Fibromyalgia -  Abdominal (+) + obese,   Peds  Hematology   Anesthesia Other Findings   Reproductive/Obstetrics                         Anesthesia Physical Anesthesia Plan  ASA: III  Anesthesia Plan: General   Post-op Pain Management:    Induction: Intravenous  Airway Management Planned: Oral ETT  Additional Equipment:   Intra-op Plan:   Post-operative Plan: Extubation in OR  Informed Consent: I have reviewed the patients History and Physical, chart, labs and discussed the procedure including the risks, benefits and alternatives for the proposed anesthesia with the patient or authorized representative who has indicated his/her understanding and acceptance.     Plan Discussed with: CRNA  Anesthesia Plan Comments:        Anesthesia Quick Evaluation

## 2013-05-16 NOTE — Preoperative (Signed)
Beta Blockers   Reason not to administer Beta Blockers:Not Applicable 

## 2013-05-16 NOTE — Anesthesia Procedure Notes (Addendum)
Anesthesia Regional Block:  Interscalene brachial plexus block  Pre-Anesthetic Checklist: ,, timeout performed, Correct Patient, Correct Site, Correct Laterality, Correct Procedure, Correct Position, site marked, Risks and benefits discussed, at surgeon's request and post-op pain management  Laterality: Upper and Right  Prep: chloraprep       Needles:  Injection technique: Single-shot  Needle Type: Echogenic Needle      Needle Gauge: 22 and 22 G  Needle insertion depth: 3 cm   Additional Needles:  Procedures: ultrasound guided (picture in chart) and nerve stimulator Interscalene brachial plexus block  Nerve Stimulator or Paresthesia:  Response: Twitch elicited, 0.5 mA, 0.3 ms,   Additional Responses:   Narrative:  Start time: 05/16/2013 7:10 AM End time: 05/16/2013 7:25 AM Injection made incrementally with aspirations every 5 mL.  Performed by: Personally  Anesthesiologist: Alma Friendly, MD  Additional Notes: Block assessed prior to start of surgery  Interscalene brachial plexus block Procedure Name: Intubation Date/Time: 05/16/2013 7:45 AM Performed by: Sharlene Dory E Pre-anesthesia Checklist: Patient identified, Emergency Drugs available, Suction available, Patient being monitored and Timeout performed Patient Re-evaluated:Patient Re-evaluated prior to inductionOxygen Delivery Method: Circle system utilized Preoxygenation: Pre-oxygenation with 100% oxygen Intubation Type: IV induction Ventilation: Mask ventilation without difficulty Tube type: Oral Tube size: 7.0 mm Number of attempts: 1 Airway Equipment and Method: Stylet and Video-laryngoscopy Placement Confirmation: ETT inserted through vocal cords under direct vision,  positive ETCO2 and breath sounds checked- equal and bilateral Secured at: 21 cm Tube secured with: Tape Dental Injury: Teeth and Oropharynx as per pre-operative assessment

## 2013-05-17 LAB — BASIC METABOLIC PANEL
BUN: 8 mg/dL (ref 6–23)
Chloride: 105 mEq/L (ref 96–112)
GFR calc Af Amer: 79 mL/min — ABNORMAL LOW (ref >90)
GFR calc non Af Amer: 68 mL/min — ABNORMAL LOW (ref >90)
Glucose, Bld: 128 mg/dL — ABNORMAL HIGH (ref 70–99)
Potassium: 4 mEq/L (ref 3.5–5.1)
Sodium: 137 mEq/L (ref 135–145)

## 2013-05-17 LAB — CBC
MCH: 27.5 pg (ref 26.0–34.0)
MCHC: 34.2 g/dL (ref 30.0–36.0)
Platelets: 123 10*3/uL — ABNORMAL LOW (ref 150–400)
RBC: 3.63 MIL/uL — ABNORMAL LOW (ref 3.87–5.11)

## 2013-05-17 MED ORDER — OXYCODONE HCL ER 20 MG PO T12A
20.0000 mg | EXTENDED_RELEASE_TABLET | Freq: Four times a day (QID) | ORAL | Status: DC
  Administered 2013-05-17 – 2013-05-20 (×12): 20 mg via ORAL
  Filled 2013-05-17 (×13): qty 1

## 2013-05-17 NOTE — Progress Notes (Signed)
Occupational Therapy Evaluation Patient Details Name: MILI PILTZ MRN: [id number] DOB: [birth year] Today's Date: 05/17/2013 Time: 1478-2956 OT Time Calculation (min): 42 min  OT Assessment / Plan / Recommendation History of present illness Patient is s/p R shoulder reverse total shoulder arthroplasty by Dr. Ranell Patrick.   Clinical Impression   Patient presents to OT with decreased ADL independence and safety. Will benefit from OT to maximize independence and facilitate safe discharge.    OT Assessment  Patient needs continued OT Services    Follow Up Recommendations  SNF; ?d/c home -- currently not safe    Barriers to Discharge Decreased caregiver support    Equipment Recommendations  3 in 1 bedside comode    Recommendations for Other Services    Frequency  Min 2X/week    Precautions / Restrictions Precautions Precautions: Fall;Shoulder Type of Shoulder Precautions: Dr. Ranell Patrick reverse total shoulder Shoulder Interventions: Shoulder sling/immobilizer;Off for dressing/bathing/exercises Precaution Booklet Issued: Yes (comment) Precaution Comments: husband reports pt has fallen at home multiple times this year   Required Braces or Orthoses: Sling Restrictions Weight Bearing Restrictions: Yes RUE Weight Bearing: Non weight bearing   Pertinent Vitals/Pain 8/10 pain R shoulder    ADL  Eating/Feeding: Performed;Minimal assistance Where Assessed - Eating/Feeding: Edge of bed Upper Body Dressing: Performed;+1 Total assistance (sling, gown) Where Assessed - Upper Body Dressing: Unsupported sitting Toilet Transfer: Performed;Minimal assistance Toilet Transfer Method: Sit to stand;Stand pivot Toilet Transfer Equipment: Bedside commode Toileting - Clothing Manipulation and Hygiene: Performed;Moderate assistance Where Assessed - Toileting Clothing Manipulation and Hygiene: Sit to stand from 3-in-1 or toilet Transfers/Ambulation Related to ADLs: Min A bed mobility, min A sit to stand  and pivot to/from [facility] ADL Comments: Reviewed shoulder protocol sitting EOB, pt. lethargic from pain meds    OT Diagnosis: Generalized weakness;Acute pain  OT Problem List: Decreased strength;Decreased range of motion;Pain;Impaired UE functional use OT Treatment Interventions: Self-care/ADL training;Therapeutic exercise;DME and/or AE instruction;Therapeutic activities;Patient/family education   OT Goals(Current goals can be found in the care plan section) Acute Rehab OT Goals Patient Stated Goal: none stated OT Goal Formulation: With patient Time For Goal Achievement: 05/17/13 Potential to Achieve Goals: Good  Visit Information  Last OT Received On: 05/17/13 Assistance Needed: +1 History of Present Illness: Patient is s/p R shoulder reverse total shoulder arthroplasty by Dr. Ranell Patrick.       Prior Functioning     Home Living Family/patient expects to be discharged to:: Private residence Living Arrangements: Spouse/significant other Available Help at Discharge: Family;Available 24 hours/day Type of Home: House Home Access: Stairs to enter [employer] of Steps: 6 Entrance Stairs-Rails: Can reach both Home Layout: One level Additional Comments: pt feels as though 22 steps through garage is the best option Prior Function Level of Independence: Independent with assistive device(s) Comments: per husband; pt was independent with ADLs and amb household distances without RW; would use RW for longer distances  Communication Communication: No difficulties Dominant Hand: Right         Vision/Perception     Cognition  Cognition Arousal/Alertness: Lethargic;Suspect due to medications Behavior During Therapy: [facility] for tasks assessed/performed Overall Cognitive Status: Impaired/Different from baseline Area of Impairment: Memory;Safety/judgement;Awareness;Problem solving Memory: Decreased recall of precautions;Decreased short-term memory Safety/Judgement: Decreased  awareness of deficits;Decreased awareness of safety Problem Solving: Slow processing;Decreased initiation;Difficulty sequencing;Requires verbal cues;Requires tactile cues General Comments: per husband; short term memory loss has been ocurring recently but today is worse; pt unable to differentiate Lt from Rt.     Extremity/Trunk Assessment Upper Extremity  Assessment Upper Extremity Assessment: RUE deficits/detail RUE Deficits / Details: due to surgery Lower Extremity Assessment Lower Extremity Assessment: Defer to PT evaluation Cervical / Trunk Assessment Cervical / Trunk Assessment: Kyphotic     Mobility Bed Mobility Bed Mobility: Supine to Sit;Sit to Supine;Sitting - Scoot to Edge of Bed Supine to Sit: 4: Min assist;HOB elevated;With rails Sitting - Scoot to Edge of Bed: 5: Supervision Sit to Supine: 4: Min assist;HOB flat;With rail Details for Bed Mobility Assistance: (A) to bring trunk to upright sitting position and into supine position; max cues for sequencing and for technique; pt with difficulty maintaining NWB through Rt UE; requires increased time due to pain  Transfers Sit to Stand: 3: Mod assist;From bed;From chair/3-in-1;With armrests;With upper extremity assist Stand to Sit: 3: Mod assist;To chair/3-in-1;With armrests;With upper extremity assist;To bed Details for Transfer Assistance: (A) to maintain balance; pt very unsteady with transfers; max cues for safety and hand placement     Exercise     Balance Balance Balance Assessed: Yes Static Standing Balance Static Standing - Balance Support: Left upper extremity supported;During functional activity Static Standing - Level of Assistance: 3: Mod assist   End of Session OT - End of Session Activity Tolerance: Patient limited by lethargy Patient left: in bed;with call bell/phone within reach;with family/visitor present Nurse Communication: Mobility status  GO     Marnette Perkins A 05/17/2013, 3:18 PM

## 2013-05-17 NOTE — Progress Notes (Signed)
Orthopedics Progress Note  Subjective: I am hurting this morning.  Objective:  Filed Vitals:   05/17/13 0525  BP: 93/67  Pulse: 89  Temp: 99 F (37.2 C)  Resp: 16    General: Awake and alert  Musculoskeletal: right shoulder dressing intact, drain with 50 cc of blood in it. Suction still working Neurovascularly intact  Lab Results  Component Value Date   WBC 6.2 05/17/2013   HGB 10.0* 05/17/2013   HCT 29.2* 05/17/2013   MCV 80.4 05/17/2013   PLT 123* 05/17/2013       Component Value Date/Time   NA 137 05/17/2013 0425   K 4.0 05/17/2013 0425   CL 105 05/17/2013 0425   CO2 23 05/17/2013 0425   GLUCOSE 128* 05/17/2013 0425   BUN 8 05/17/2013 0425   CREATININE 0.85 05/17/2013 0425   CREATININE 0.87 03/21/2013 1356   CALCIUM 8.1* 05/17/2013 0425   GFRNONAA 68* 05/17/2013 0425   GFRAA 79* 05/17/2013 0425    Lab Results  Component Value Date   INR 1.04 08/03/2010   INR 1.1 03/05/2009   INR 0.9 11/16/2006    Assessment/Plan: POD #1 s/p Procedure(s): Right shoulder reverse total shoulder replacement for shoulder fracture and nonunion Monitor drain output and pull tomorrow.   Pain control.  Spoke with nurse regarding dosing intervals and medications.  Patient normally takes the 20 mg Oxycontin 4 times per day with the OXY IR 15mg  q4 hours for breakthrough pain.  We also have some IV morphine written for breakthrough as well.  OOB with PT and OT, AROM and ADLs as tolerated.   Possible D/C Sunday if comfortable.  Almedia Balls. Ranell Patrick, MD 05/17/2013 8:54 AM

## 2013-05-17 NOTE — Evaluation (Signed)
Physical Therapy Evaluation Patient Details Name: [name] MRN: [id number] DOB: 10-28-1945 Today's Date: 05/17/2013 Time: 4098-1191 PT Time Calculation (min): 28 min  PT Assessment / Plan / Recommendation History of Present Illness  Patient is s/p R shoulder reverse total shoulder arthroplasty by Dr. Ranell Patrick.  Clinical Impression  Patient is s/p Rt TSR surgery resulting in functional limitations due to the deficits listed below (see PT Problem List). Patient will benefit from skilled PT to increase their independence and safety with mobility to allow discharge to the venue listed below. Pt with incr cognitive deficits; per husband. Pt has incr amount of steps with incr incline to get into house. Pt is very unsteady and is a high fall risk due to balance deficits and decr safety awareness. Recommending STSNF at this time. Husband and wife is agreeable but continues to hope for progress for safe D/C home.     PT Assessment  Patient needs continued PT services    Follow Up Recommendations  SNF;Supervision/Assistance - 24 hour    Does the patient have the potential to tolerate intense rehabilitation      Barriers to Discharge Decreased caregiver support;Inaccessible home environment husband unable to provide increased (A) due to physical (A)     Equipment Recommendations  Other (comment) (TBD )    Recommendations for Other Services     Frequency Min 3X/week    Precautions / Restrictions Precautions Precautions: Fall;Shoulder Type of Shoulder Precautions: Dr. Ranell Patrick reverse total shoulder Shoulder Interventions: Shoulder sling/immobilizer;Off for dressing/bathing/exercises Precaution Booklet Issued: Yes (comment) Precaution Comments: husband reports pt has fallen at home multiple times this year   Required Braces or Orthoses: Sling Restrictions Weight Bearing Restrictions: Yes RUE Weight Bearing: Non weight bearing   Pertinent Vitals/Pain 10/10; RN provided medication to assist  with pain control       Mobility  Bed Mobility Bed Mobility: Supine to Sit;Sit to Supine;Sitting - Scoot to Edge of Bed Supine to Sit: 4: Min assist;HOB elevated;With rails Sitting - Scoot to Edge of Bed: 5: Supervision Sit to Supine: 4: Min assist;HOB flat;With rail Details for Bed Mobility Assistance: (A) to bring trunk to upright sitting position and into supine position; max cues for sequencing and for technique; pt with difficulty maintaining NWB through Rt UE; requires increased time due to pain  Transfers Transfers: Sit to Stand;Stand to Sit Sit to Stand: 3: Mod assist;From bed;From chair/3-in-1;With armrests;With upper extremity assist Stand to Sit: 3: Mod assist;To chair/3-in-1;With armrests;With upper extremity assist;To bed Details for Transfer Assistance: (A) to maintain balance; pt very unsteady with transfers; max cues for safety and hand placement Ambulation/Gait Ambulation/Gait Assistance: 3: Mod assist Ambulation Distance (Feet): 12 Feet (x2) Assistive device: Straight cane Ambulation/Gait Assistance Details: pt very unsteady and staggering gt pt unaware of balance deficits and sequencing with SPC; max directional cues for sequencing and safety; (A) around waist of pt and under Lt UE of pt to maintain balance  Gait Pattern: Wide base of support;Trunk flexed;Shuffle (staggering ) Gait velocity: decr; pauses from time to time and requires cues to continue gt  Stairs: No Wheelchair Mobility Wheelchair Mobility: No         PT Diagnosis: Acute pain;Abnormality of gait  PT Problem List: Decreased activity tolerance;Decreased balance;Decreased mobility;Decreased knowledge of use of DME;Decreased safety awareness;Decreased knowledge of precautions;Pain PT Treatment Interventions: DME instruction;Gait training;Functional mobility training;Therapeutic activities;Therapeutic exercise;Balance training;Neuromuscular re-education;Patient/family education     PT Goals(Current  goals can be found in the care plan section) Acute Rehab PT  Goals Patient Stated Goal: to get pain meds and rest PT Goal Formulation: With patient/family Time For Goal Achievement: 05/24/13 Potential to Achieve Goals: Fair  Visit Information  Last PT Received On: 05/17/13 Assistance Needed: +1 History of Present Illness: Patient is s/p R shoulder reverse total shoulder arthroplasty by Dr. Ranell Patrick.       Prior Functioning  Home Living Family/patient expects to be discharged to:: Private residence Living Arrangements: Spouse/significant other Available Help at Discharge: Family;Available 24 hours/day Type of Home: House Home Access: Stairs to enter [employer] of Steps: 6 Entrance Stairs-Rails: Can reach both Home Layout: One level Additional Comments: pt feels as though 22 steps through garage is the best option Prior Function Level of Independence: Independent with assistive device(s) Comments: per husband; pt was independent with ADLs and amb household distances without RW; would use RW for longer distances  Communication Communication: No difficulties Dominant Hand: Right    Cognition  Cognition Arousal/Alertness: Lethargic;Suspect due to medications Behavior During Therapy: [facility] for tasks assessed/performed Overall Cognitive Status: Impaired/Different from baseline Area of Impairment: Memory;Safety/judgement;Awareness;Problem solving Memory: Decreased recall of precautions;Decreased short-term memory Safety/Judgement: Decreased awareness of deficits;Decreased awareness of safety Problem Solving: Slow processing;Decreased initiation;Difficulty sequencing;Requires verbal cues;Requires tactile cues General Comments: per husband; short term memory loss has been ocurring recently but today is worse; pt unable to differentiate Lt from Rt.     Extremity/Trunk Assessment Upper Extremity Assessment Upper Extremity Assessment: RUE deficits/detail RUE Deficits / Details:  due to surgery Lower Extremity Assessment Lower Extremity Assessment: Defer to PT evaluation Cervical / Trunk Assessment Cervical / Trunk Assessment: Kyphotic   Balance Balance Balance Assessed: Yes Static Standing Balance Static Standing - Balance Support: Left upper extremity supported;During functional activity Static Standing - Level of Assistance: 3: Mod assist  End of Session PT - End of Session Equipment Utilized During Treatment: Gait belt;Other (comment) (Rt UE shoulder sling ) Activity Tolerance: Patient limited by pain;Patient limited by lethargy Patient left: in bed;with call bell/phone within reach Nurse Communication: Mobility status  GP     Donell Sievert, [place] [phone] 05/17/2013, 3:18 PM

## 2013-05-17 NOTE — Plan of Care (Signed)
Problem: Acute Rehab PT Goals(only PT should resolve) Goal: Pt Will Go Up/Down Stairs It pt D/C home, will need to meet stair goal.

## 2013-05-17 NOTE — Discharge Summary (Addendum)
Physician Discharge Summary   Patient ID: [name] MRN: [id number] DOB/AGE: [birth year] 67 y.o.  Admit date: 05/16/2013 Discharge date: 05/17/2013  Admission Diagnoses:  Right shoulder fracture and nonunion  Discharge Diagnoses:  Same   Surgeries: Procedure(s): RIGHT SHOULDER REVERSE TOTAL SHOULDER REPLACEMENT   Consultants: OT AND PT  Discharged Condition: Stable  Hospital Course: [name] is an 67 y.o. 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  They were brought to the operating room on 05/16/2013 and underwent the above named procedures.    The patient had an uncomplicated hospital course and was stable for discharge.  Recent vital signs:  Filed Vitals:   05/17/13 0525  BP: 93/67  Pulse: 89  Temp: 99 F (37.2 C)  Resp: 16    Recent laboratory studies:  Results for orders placed during the hospital encounter of 05/16/13  CBC      Result Value Range   WBC 6.2  4.0 - 10.5 K/uL   RBC 3.63 (*) 3.87 - 5.11 MIL/uL   Hemoglobin 10.0 (*) 12.0 - 15.0 g/dL   HCT 46.8 (*) 03.2 - 12.2 %   MCV 80.4  78.0 - 100.0 fL   MCH 27.5  26.0 - 34.0 pg   MCHC 34.2  30.0 - 36.0 g/dL   RDW 48.2 (*) 50.0 - 37.0 %   Platelets 123 (*) 150 - 400 K/uL  BASIC METABOLIC PANEL      Result Value Range   Sodium 137  135 - 145 mEq/L   Potassium 4.0  3.5 - 5.1 mEq/L   Chloride 105  96 - 112 mEq/L   CO2 23  19 - 32 mEq/L   Glucose, Bld 128 (*) 70 - 99 mg/dL   BUN 8  6 - 23 mg/dL   Creatinine, Ser 4.88  0.50 - 1.10 mg/dL   Calcium 8.1 (*) 8.4 - 10.5 mg/dL   GFR calc non Af Amer 68 (*) >90 mL/min   GFR calc Af Amer 79 (*) >90 mL/min  TYPE AND SCREEN      Result Value Range   ABO/RH(D) B POS     Antibody Screen NEG     Sample Expiration 05/19/2013     Unit Number [id number]     Blood Component Type RED CELLS,LR     Unit division 00     Status of Unit ISSUED     Transfusion Status OK TO TRANSFUSE     Crossmatch Result Compatible    PREPARE RBC (CROSSMATCH)      Result Value Range   Order Confirmation ORDER PROCESSED BY BLOOD BANK      Discharge Medications:     Medication List    ASK your doctor about these medications       aspirin 81 MG EC tablet  Take 81 mg by mouth daily.     CENTRUM SILVER PO  Take 1 tablet by mouth daily.     clonazePAM 1 MG tablet  Commonly known as:  KLONOPIN  Take 1 mg by mouth 2 (two) times daily. Per Dr. Evelene Croon     Coenzyme Q10 300 MG Caps  Take 1 capsule by mouth daily.     DULoxetine 30 MG capsule  Commonly known as:  CYMBALTA  Take 30-60 mg by mouth 2 (two) times daily. 60 mg in am and 30 mg in pm     fluticasone  50 MCG/ACT nasal spray  Commonly known as:  FLONASE  Place 2 sprays into the nose daily.     frovatriptan 2.5 MG tablet  Commonly known as:  FROVA  Take 2.5 mg by mouth as needed. If recurs, may repeat after 2 hours. Max of 3 tabs in 24 hours.  Per Dr. Conrad [place]     furosemide 20 MG tablet  Commonly known as:  LASIX  Take 20 mg by mouth daily as needed for edema.     levothyroxine 112 MCG tablet  Commonly known as:  SYNTHROID, LEVOTHROID  Take 112 mcg by mouth daily before breakfast.     LYRICA 100 MG capsule  Generic drug:  pregabalin  Take 100 mg by mouth 5 (five) times daily. Per Dr. Celedonio Miyamoto     meloxicam 7.5 MG tablet  Commonly known as:  MOBIC  Take 7.5 mg by mouth daily.     Omega-3 Fatty Acids 500 MG Caps  Take 1 capsule by mouth daily.     ondansetron 4 MG tablet  Commonly known as:  ZOFRAN  Take 4 mg by mouth every 8 (eight) hours as needed. for migraine per Dr. Loreta Ave     OVER THE COUNTER MEDICATION  Take 1 tablet by mouth daily. Tumeric 490 mg BID     oxyCODONE 20 MG 12 hr tablet  Commonly known as:  OXYCONTIN  Take 20 mg by mouth 4 (four) times daily. Per Dr. Conrad [place]     oxyCODONE 15 MG immediate release tablet  Commonly known as:  ROXICODONE  Take 15 mg  by mouth every 4 (four) hours as needed for pain.     pantoprazole 40 MG tablet  Commonly known as:  PROTONIX  Take 40 mg by mouth daily as needed. For GERD     QUEtiapine 50 MG tablet  Commonly known as:  SEROQUEL  Take 50 mg by mouth at bedtime.     ramelteon 8 MG tablet  Commonly known as:  ROZEREM  Take 8 mg by mouth at bedtime. Per Dr. Evelene Croon     SAM-e 200 MG Tabs  Take 2 tablets by mouth daily. Per patient     tiZANidine 4 MG tablet  Commonly known as:  ZANAFLEX  Take 2 mg by mouth 2 (two) times daily. 1/2 tablet every morning, 1/2 tablet at supper per Dr. Conrad [place]     Vitamin D3 2000 UNITS capsule  Take 2,000 Units by mouth at bedtime.        Diagnostic Studies: Dg Shoulder Right  05/16/2013   *RADIOLOGY REPORT*  Clinical Data: Right shoulder arthroplasty  RIGHT SHOULDER - 2+ VIEW  Comparison: CT 01/21/2013  Findings: Single AP view of the right shoulder reveals satisfactory alignment and position of total shoulder replacement on the right. No acute complication  IMPRESSION: Satisfactory right total shoulder.   Original Report Authenticated By: Janeece Riggers, M.D.    Disposition: 01-Home or Self Care        Follow-up Information   Call Verlee Rossetti, MD. [phone])    Specialty:  Orthopedic Surgery   Contact information:   [address] [place] [address] [phone]      Patient will resume her normal dosing and frequency of Oxycontin as she was on before surgery,  I have given her additional Oxy IR for breakthrough pain which she should wean as quickly as she can.  Patient developed symptomatic acute blood loss anemia requiring transfusion on 05/19/13.  Patient feeling much better the  morning of  05/20/13 and stable for D/C home.  Signed: Alsie Younes,STEVEN R 05/17/2013, 9:01 AM  I have personally examined the patient and agree with the evaluation and treatment plan as documented by Maisie Fus B. Dixon, PA-C.  Almedia Balls. Ranell Patrick, MD [facility] [phone] office [phone] pager

## 2013-05-17 NOTE — Op Note (Signed)
NAMESHENG, PRITZ                 ACCOUNT NO.:  [id number]  MEDICAL RECORD NO.:  [id number]  LOCATION:  5N05C                        FACILITY:  [facility]  PHYSICIAN:  Almedia Balls. Ranell Patrick, M.D. 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  PROCEDURE PERFORMED:  Right shoulder reverse total shoulder arthroplasty using DePuy Delta Xtend prosthesis.  ATTENDING SURGEON:  Almedia Balls. Ranell Patrick, M.D.  ASSISTANT:  Donnie Coffin. Dixon, PA-C, who was scrubbed the entire procedure and necessary for satisfactory completion of surgery.  ANESTHESIA:  General anesthesia was used plus interscalene block.  ESTIMATED BLOOD LOSS:  350 mL.  FLUID REPLACEMENT:  400 mL of blood and 1500 mL of crystalloid.  INSTRUMENT COUNTS:  Correct.  COMPLICATIONS:  There were no complications.  ANTIBIOTICS:  Perioperative antibiotics given.  INDICATIONS:  The patient is a 67 year old female who has been managed conservatively for a year for right displaced proximal humerus fracture. The patient went on to a nonunion with terrible function and pain.  The patient presented to me for surgical consultation.  After discussing the patient options for management, we discussed potential for either hemiarthroplasty, but more likely reverse total shoulder arthroplasty for this patient.  Informed consent was obtained.  DESCRIPTION OF PROCEDURE:  After an adequate level of anesthesia was achieved, the patient was positioned in the modified beach-chair position.  Right shoulder correctly identified, sterilely prepped and draped in usual manner.  Time-out was called.  We then entered the shoulder using standard deltopectoral incision.  Dissection from  the coracoid process down to the anterior humeral shaft using a 10-blade. Dissection down through subcutaneous tissues using Bovie electrocautery. We identified the cephalic vein and took it laterally to the deltoid, pectoralis taken medially after 1 cm of pec released off the humeral shaft, which is essentially encased in scar tissue.  We had to remove quite a bit of scar just to get the shaft dissected free from surrounding soft tissues.  The subscapularis was still attached to the lesser tuberosity and was freed up from surrounding soft tissues and sutures placed for repair.  At the end, the rotator cuff was attached to the humeral head and to the remnant of the greater tuberosity, but it was retracted severely and atrophied.  We went ahead and released that off the humeral head as we were doing a reverse arthroplasty, but not need that.  Bleeding was controlled with Bovie electrocautery.  We did go ahead and then prepare the humeral shaft with sequential reaming up to a size 12 diameter for the shaft.  We then went ahead and briefly trialed a 12 size 1 cemented Delta Xtend humeral prosthesis set on 0 degrees of version and felt like that was going to the appropriate height.  Next we then prepared our glenoid, did a 360-degree scar removal and released the biceps tendon remnant and then removed cartilage off the glenoid, this was an extremely small glenoid with some insufficiency anteriorly due  to the erosion from the humeral fracture. We then went ahead and placed our central guide pin and reamed for the Metaglene and then drilled our central lug hole for the Metaglene and impacted the Metaglene in position working off the inferior neck of the scapula.  We then placed her inferiorly positioned and superiorly positioned 36 locking screws, followed by an 18 nonlocked posteriorly with good purchase.  Next, we placed a 38 standard glenosphere and screwed that in position.  We were pleased  with that positioning.  We checked to make sure the axillary nerve was not incarcerated or impinging upon the inferior portion of the Metaglene and it was not. Next, we went ahead and finished our trialing on the humeral side.  We placed a size 12 at 7.0 version and then reduced it with +3 and then a +6 poly trial.  We then were happy with the tensioning and went ahead and removed trial components, thoroughly irrigated the shoulder and then cemented the humeral component in place, this is again a size 12 with a 1 proximal diameter Delta Xtend humeral prosthesis designed for cementing in place.  We cemented that in position at 0 degrees of version at the appropriate height and once the cement was allowed to harden, we trialed again with +6 and decided to go with a +9 poly and inserted a 38+ 9 poly, impacted that in position, reduced the shoulder, thoroughly irrigated.  We were pleased again with tension on the conjoined tendon, negative sulcus and no gapping with external rotation. We repaired the subscapularis to the proximal humerus into the component suturing with #2 FiberWire suture.  We then went ahead and closed the deltopectoral interval with interrupted 0-Vicryl suture.  We did go ahead and place a drain deep to the deltoid and sewed that in with nylon and then 2-0 subcutaneous closure of Vicryl and 4-0 running Monocryl with Steri-Strips.  Sterile compressive bandage was then applied.  The patient was placed in a sling and then taken to recovery room in stable condition.     Almedia Balls. Ranell Patrick, M.D.     SRN/MEDQ  D:  05/16/2013  T:  05/17/2013  Job:  [id number]

## 2013-05-18 NOTE — Progress Notes (Signed)
Paged Pa on call. Spoke with Avel Peace Pa, who will let Md know PT recommendations are that pt is not ready for discharge today. Pa agrees patient may stay tonight and continue to work with PT.

## 2013-05-18 NOTE — Progress Notes (Addendum)
Occupational Therapy Treatment Patient Details Name: [name] MRN: [id number] DOB: Oct 21, 1945 Today's Date: 05/18/2013 Time: 4098-1191 OT Time Calculation (min): 47 min  OT Assessment / Plan / Recommendation  History of present illness Patient is s/p R shoulder reverse total shoulder arthroplasty by Dr. Ranell Patrick.   OT comments  Pt lethargic during session. Performed exercises and OT reviewed information on handout. Pt very unsteady requiring Mod A to ambulate. Pt's spouse is not comfortable taking her home. Nurse notified. I do not feel pt is safe to d/c home at this point.   Follow Up Recommendations  SNF    Barriers to Discharge       Equipment Recommendations  Other (comment) (tbd)    Recommendations for Other Services    Frequency Min 2X/week   Progress towards OT Goals Progress towards OT goals: Progressing toward goals  Plan Discharge plan remains appropriate    Precautions / Restrictions Precautions Precautions: Fall;Shoulder Type of Shoulder Precautions: ADL and Sling Education; Active Shoulder ROM within limits of pain Shoulder Interventions: Shoulder sling/immobilizer;For comfort Precaution Booklet Issued: Yes (comment) Precaution Comments: husband reports pt has fallen at home multiple times this year   Required Braces or Orthoses: Sling Restrictions Weight Bearing Restrictions: Yes RUE Weight Bearing: Non weight bearing   Pertinent Vitals/Pain Pain in RUE but not rated. Repositioned.     ADL  Grooming: Performed;Wash/dry face;Set up Where Assessed - Grooming: Unsupported sitting;Other (comment) (on BSC) Upper Body Dressing: +1 Total A (shoulder sling); Unsupported sitting (sitting EOB) Toilet Transfer: Min guard;Minimal assistance Toilet Transfer Method: Sit to Barista: Bedside commode Toileting - Clothing Manipulation and Hygiene: Minimal assistance Where Assessed - Toileting Clothing Manipulation and Hygiene: Sit to stand from  3-in-1 or toilet Equipment Used: Gait belt;Other (comment) (shoulder sling) Transfers/Ambulation Related to ADLs: Mod A for ambulation and Min guard/Min A for transfers ADL Comments: Performed UE exercises (approximately 10 reps of each), with the exception of pendulums (did not perform). Pt very unsteady and also lethargic during session. OT reviewed self-care information on handout and pt ambulated to bathroom to try to urinate. Pt spouse arrived during session and OT spoke to him as well. Educated on precautions and cues to maintain.    OT Diagnosis:    OT Problem List:   OT Treatment Interventions:     OT Goals(current goals can now be found in the care plan section) Acute Rehab OT Goals Patient Stated Goal: none stated OT Goal Formulation: With patient Time For Goal Achievement: 05/17/13 Potential to Achieve Goals: Good ADL Goals Pt Will Perform Upper Body Bathing: with min assist Pt Will Perform Lower Body Bathing: with supervision Pt Will Perform Upper Body Dressing: with min assist Pt Will Perform Lower Body Dressing: with supervision Pt Will Transfer to Toilet: with supervision Pt Will Perform Toileting - Clothing Manipulation and hygiene: with supervision Additional ADL Goal #1: Pt/caregiver will be independent with HEP.  Additional ADL Goal #2: Caregiver will be independent in donning/doffing shoulder sling and shirt while maintaining precautions.   Visit Information  Last OT Received On: 05/18/13 Assistance Needed: +1 History of Present Illness: Patient is s/p R shoulder reverse total shoulder arthroplasty by Dr. Ranell Patrick.    Subjective Data      Prior Functioning       Cognition  Cognition Arousal/Alertness: Lethargic;Suspect due to medications Behavior During Therapy: [facility] for tasks assessed/performed Overall Cognitive Status: Impaired/Different from baseline Area of Impairment: Memory Memory: Decreased short-term memory    Mobility  Bed Mobility Bed Mobility:  Supine to Sit;Sitting - Scoot to Edge of Bed Supine to Sit: 3: Mod assist;HOB flat Sitting - Scoot to Edge of Bed: 4: Min guard Details for Bed Mobility Assistance: Mod A to lift trunk to sitting position. Cues for technique and precautions. Transfers Transfers: Sit to Stand;Stand to Sit Sit to Stand: 4: Min guard;From bed;From chair/3-in-1;4: Min assist Stand to Sit: 4: Min guard;4: Min assist;With upper extremity assist;To chair/3-in-1;To bed Details for Transfer Assistance: Min A to place Lt hand to armrest on recliner when going from stand to sit. Pt unsteady with transfers. Min A/Min guard due to pt being unsteady.    Exercises  Shoulder Exercises Shoulder Flexion: AAROM;Right;10 reps;Seated Shoulder Extension: AAROM;Right;10 reps;Seated Shoulder External Rotation: PROM;AAROM;10 reps;Seated Elbow Flexion: Right;10 reps;Standing;AAROM Elbow Extension: AAROM;Right;10 reps;Standing Wrist Flexion: AROM;Right;10 reps;Standing Wrist Extension: AROM;Right;10 reps;Standing Digit Composite Flexion: AROM;Right;10 reps;Standing Composite Extension: AROM;Right;10 reps;Standing Donning/doffing shirt without moving shoulder:  (educated pt and husband on dressing technique) Method for sponge bathing under operated UE:  (educated/demonstrated leaning forwards) Donning/doffing sling/immobilizer:  (OT demonstrated and explained) Correct positioning of sling/immobilizer:  (educated) ROM for elbow, wrist and digits of operated UE:  (AAROM for elbow ROM; helped with positioning arm) Sling wearing schedule (on at all times/off for ADL's):  (educated) Positioning of UE while sleeping:  (educated )   Balance     End of Session OT - End of Session Equipment Utilized During Treatment: Gait belt;Other (comment) (shoulder sling) Activity Tolerance: Patient limited by lethargy Patient left: with call bell/phone within reach;in chair;with family/visitor present Nurse Communication: Mobility status  GO      Earlie Raveling OTR/L [phone] 05/18/2013, 11:40 AM

## 2013-05-18 NOTE — Progress Notes (Signed)
Orthopedics Progress Note  Subjective: I feel better today  Objective:  Filed Vitals:   05/18/13 0634  BP: 105/59  Pulse: 83  Temp: 98.5 F (36.9 C)  Resp: 16    General: Awake and alert  Musculoskeletal: right shoulder wound CDI, no bleeding, drain out Neurovascularly intact  Lab Results  Component Value Date   WBC 6.2 05/17/2013   HGB 10.0* 05/17/2013   HCT 29.2* 05/17/2013   MCV 80.4 05/17/2013   PLT 123* 05/17/2013       Component Value Date/Time   NA 137 05/17/2013 0425   K 4.0 05/17/2013 0425   CL 105 05/17/2013 0425   CO2 23 05/17/2013 0425   GLUCOSE 128* 05/17/2013 0425   BUN 8 05/17/2013 0425   CREATININE 0.85 05/17/2013 0425   CREATININE 0.87 03/21/2013 1356   CALCIUM 8.1* 05/17/2013 0425   GFRNONAA 68* 05/17/2013 0425   GFRAA 79* 05/17/2013 0425    Lab Results  Component Value Date   INR 1.04 08/03/2010   INR 1.1 03/05/2009   INR 0.9 11/16/2006    Assessment/Plan: POD #2 s/p Procedure(s): RIGHT REVERSE TOTAL SHOULDER ARTHROPLASTY  Stable for D/C Home Exercises rehearsed Daily dry dressing changes  Viviann Spare R. Ranell Patrick, MD 05/18/2013 9:01 AM

## 2013-05-19 LAB — CBC WITH DIFFERENTIAL/PLATELET
Basophils Relative: 0 % (ref 0–1)
HCT: 24.9 % — ABNORMAL LOW (ref 36.0–46.0)
Hemoglobin: 8.3 g/dL — ABNORMAL LOW (ref 12.0–15.0)
Lymphs Abs: 2 10*3/uL (ref 0.7–4.0)
MCH: 27.5 pg (ref 26.0–34.0)
MCHC: 33.3 g/dL (ref 30.0–36.0)
Monocytes Absolute: 0.8 10*3/uL (ref 0.1–1.0)
Monocytes Relative: 14 % — ABNORMAL HIGH (ref 3–12)
Neutro Abs: 2.5 10*3/uL (ref 1.7–7.7)
Neutrophils Relative %: 45 % (ref 43–77)
RBC: 3.02 MIL/uL — ABNORMAL LOW (ref 3.87–5.11)

## 2013-05-19 LAB — BASIC METABOLIC PANEL
BUN: 9 mg/dL (ref 6–23)
Chloride: 104 mEq/L (ref 96–112)
Creatinine, Ser: 0.85 mg/dL (ref 0.50–1.10)
Glucose, Bld: 114 mg/dL — ABNORMAL HIGH (ref 70–99)
Potassium: 4 mEq/L (ref 3.5–5.1)

## 2013-05-19 MED ORDER — DIPHENHYDRAMINE HCL 25 MG PO CAPS
25.0000 mg | ORAL_CAPSULE | Freq: Once | ORAL | Status: AC
  Administered 2013-05-19: 25 mg via ORAL
  Filled 2013-05-19: qty 1

## 2013-05-19 MED ORDER — ACETAMINOPHEN 325 MG PO TABS
650.0000 mg | ORAL_TABLET | Freq: Once | ORAL | Status: AC
  Administered 2013-05-19: 650 mg via ORAL

## 2013-05-19 MED ORDER — FUROSEMIDE 10 MG/ML IJ SOLN
20.0000 mg | Freq: Once | INTRAMUSCULAR | Status: AC
  Administered 2013-05-19: 20 mg via INTRAVENOUS
  Filled 2013-05-19 (×2): qty 2

## 2013-05-19 NOTE — Progress Notes (Signed)
Orthopedics Progress Note  Subjective: I feel better today.  Objective:  Filed Vitals:   05/19/13 1300  BP: 89/42  Pulse: 68  Temp: 98.2 F (36.8 C)  Resp: 18    General: Awake and alert  Musculoskeletal: looks a little anemic today,  Right shoulder dressing CDI Neurovascularly intact  Lab Results  Component Value Date   WBC 6.2 05/17/2013   HGB 10.0* 05/17/2013   HCT 29.2* 05/17/2013   MCV 80.4 05/17/2013   PLT 123* 05/17/2013       Component Value Date/Time   NA 137 05/17/2013 0425   K 4.0 05/17/2013 0425   CL 105 05/17/2013 0425   CO2 23 05/17/2013 0425   GLUCOSE 128* 05/17/2013 0425   BUN 8 05/17/2013 0425   CREATININE 0.85 05/17/2013 0425   CREATININE 0.87 03/21/2013 1356   CALCIUM 8.1* 05/17/2013 0425   GFRNONAA 68* 05/17/2013 0425   GFRAA 79* 05/17/2013 0425    Lab Results  Component Value Date   INR 1.04 08/03/2010   INR 1.1 03/05/2009   INR 0.9 11/16/2006    Assessment/Plan: POD #3 s/p Procedure(s): RIGHT REVERSE TOTAL SHOULDER ARTHROPLASTY  Check labs, possible d/c today  Viviann Spare R. Ranell Patrick, MD 05/19/2013 1:30 PM

## 2013-05-19 NOTE — Progress Notes (Signed)
Utilization review completed.  

## 2013-05-19 NOTE — Progress Notes (Signed)
Occupational Therapy Treatment Patient Details Name: [name] MRN: [id number] DOB: [birth year] Today's Date: 05/19/2013 Time: 8295-6213 OT Time Calculation (min): 28 min  OT Assessment / Plan / Recommendation       OT comments  Pt. Able to tolerate pendulum exercises today in standing in multiple directions with max cues to allow arm to move naturally, pt. Was trying to swing arm.  Also tolerated elbow flex/ext. In standing with intermittent inst./tactile cues for tech.  States husband able to assist with don/doff sling.  Still appears to have confusion.  Noted to giggle/laugh when unable to proceed with next step in a task but would not initiate the next step. per previous otr/l questions left sticky note for m.d. For some additional clarification with rue for with HEP guidelines.   Follow Up Recommendations  SNF                      Frequency Min 2X/week   Progress towards OT Goals Progress towards OT goals: Progressing toward goals  Plan Discharge plan remains appropriate    Precautions / Restrictions Precautions Precautions: Fall;Shoulder Type of Shoulder Precautions: adl and sling education, AROM for shoulder ROM  Shoulder Interventions: Shoulder sling/immobilizer;For comfort Restrictions RUE Weight Bearing: Non weight bearing   Pertinent Vitals/Pain States 0/10, but would occasionally moan and grimace during exercises     ADL  Upper Body Dressing: +1 Total assistance Where Assessed - Upper Body Dressing: Unsupported sitting Transfers/Ambulation Related to ADLs: mod a with hha for ambulation, min guard for transfer with inst. cues for tech. ADL Comments: utilized handout for arom exercises. pt. able to tolerate 10 reps of elbow flex/ext., and pendulums multiple directions with max cues and inst. for safe completion.        OT Goals(current goals can now be found in the care plan section)    Visit Information  Last OT Received On: 05/19/13                  Cognition  Cognition Arousal/Alertness: Lethargic Behavior During Therapy: [facility] for tasks assessed/performed Overall Cognitive Status: Impaired/Different from baseline Area of Impairment: Memory Memory: Decreased short-term memory Safety/Judgement: Decreased awareness of deficits;Decreased awareness of safety Problem Solving: Slow processing;Decreased initiation;Difficulty sequencing;Requires verbal cues;Requires tactile cues General Comments: pt. giggling/laughing mostly noted when unable to initiate or recall next step during tasks    Mobility  Bed Mobility Bed Mobility: Supine to Sit;Sitting - Scoot to Edge of Bed Supine to Sit: HOB flat;3: Mod assist Sitting - Scoot to Edge of Bed: 4: Min guard Details for Bed Mobility Assistance: mod a to lift trunk into sitting position, pt. laughing when she began to lay back but did not initiate correction or reaching with lue to assist Transfers Transfers: Sit to Stand;Stand to Sit Sit to Stand: 4: Min guard;From bed Stand to Sit: 4: Min guard;With upper extremity assist;With armrests;To chair/3-in-1    Exercises  Shoulder Exercises Pendulum Exercise: 5 reps;Standing Elbow Flexion: AAROM;Right;10 reps;Standing Elbow Extension: AAROM;Right;10 reps;Standing Donning/doffing sling/immobilizer: Maximal assistance Correct positioning of sling/immobilizer: Maximal assistance Pendulum exercises (written home exercise program): Maximal assistance ROM for elbow, wrist and digits of operated UE: Minimal assistance   Balance Static Standing Balance Static Standing - Balance Support: Left upper extremity supported;During functional activity Static Standing - Level of Assistance: 3: Mod assist   End of Session OT - End of Session Activity Tolerance: Patient limited by lethargy Patient left: in chair;with call bell/phone within reach  Robet Leu, COTA/L 05/19/2013, 7:49 AM

## 2013-05-19 NOTE — Progress Notes (Signed)
Physical Therapy Treatment Patient Details Name: [name] MRN: [id number] DOB: 1946-04-11 Today's Date: 05/19/2013 Time: 0865-7846 PT Time Calculation (min): 30 min  PT Assessment / Plan / Recommendation  History of Present Illness Patient is s/p R shoulder reverse total shoulder arthroplasty by Dr. Ranell Patrick.   PT Comments   Pt continues to be very unsteady and off balance with mobility. Requires mod (A) to amb to/from bathroom and demo decr safety awareness due to decr cognition. Husband reports that he and a friend plan to "bumb" pt up steps in wheelchair. Husband provided with handouts regarding proper technique for wheelchair transfers up/down steps; husband appreciative of information. Pt is a high fall risk. Encouraged husband to get gt belt to help with mobility and reduce risk of falls at home, husband agreeable. Will cont to f/u with pt while in acute setting to maximize functional mobility.   Follow Up Recommendations  Home health PT;Supervision/Assistance - 24 hour;Other (comment) (therapy recommends SNF but family refusing )     Does the patient have the potential to tolerate intense rehabilitation     Barriers to Discharge        Equipment Recommendations  None recommended by PT;Other (comment) (husband states he can get DME from [employer])    Recommendations for Other Services    Frequency Min 4X/week   Progress towards PT Goals Progress towards PT goals: Progressing toward goals  Plan Discharge plan needs to be updated;Frequency needs to be updated    Precautions / Restrictions Precautions Precautions: Fall;Shoulder Precaution Comments: husband reports that pt is allowed to take sling off to walk  Required Braces or Orthoses: Sling Restrictions Weight Bearing Restrictions: Yes RUE Weight Bearing: Non weight bearing   Pertinent Vitals/Pain 7/10; RN notified. patient repositioned for comfort     Mobility  Bed Mobility Bed Mobility: Supine to Sit;Sitting - Scoot  to Edge of Bed;Sit to Supine Supine to Sit: HOB elevated;With rails;3: Mod assist (husband reports pt can sleep in recliner ) Sitting - Scoot to Edge of Bed: 5: Supervision Sit to Supine: HOB flat;With rail;4: Min assist Details for Bed Mobility Assistance: Mod (A) to lift trunk into upright sitting position; pt required (A) to acheive supine position and max cues for sequencing  Transfers Transfers: Sit to Stand;Stand to Sit Sit to Stand: 4: Min assist;From bed;With upper extremity assist;From toilet Stand to Sit: 4: Min assist;To toilet;To bed;With upper extremity assist Details for Transfer Assistance: pt relied heavily on (A) and handicap handrails for (A) to sit <> stand; pt is very unsteady with transfers; requires max cues for safety and sequencing  Ambulation/Gait Ambulation/Gait Assistance: 3: Mod assist Ambulation Distance (Feet): 12 Feet (x2) Assistive device: Straight cane Ambulation/Gait Assistance Details: pt very unsteady with amb; requires mod (A) to maintain balance and amb; pt stops frequently during amb and requires cues to continue ambulating; pt becomes very distracted easiliy; pt is a high fall risk; amb with lean to Rt and externally rotated Rt LE which she drags when becoming fatigued  Gait Pattern: Wide base of support;Trunk flexed;Shuffle Gait velocity: decr; pauses from time to time and requires cues to continue gt  Stairs: No Wheelchair Mobility Wheelchair Mobility: No         PT Diagnosis:    PT Problem List:   PT Treatment Interventions:     PT Goals (current goals can now be found in the care plan section) Acute Rehab PT Goals Patient Stated Goal: none stated PT Goal Formulation: With patient/family Time  For Goal Achievement: 05/24/13 Potential to Achieve Goals: Fair  Visit Information  Last PT Received On: 05/19/13 Assistance Needed: +1 History of Present Illness: Patient is s/p R shoulder reverse total shoulder arthroplasty by Dr. Ranell Patrick.     Subjective Data  Subjective: Pt in supine position and husband present; "i guess i could walk to the bathroom. Ive been sleeping and am really still tired" husband reported that MD was just in room and stated pt would go home either today or tomorrow. Husband reported pt would not have to do any steps and he and his friend would take her up in a wheelchair  Patient Stated Goal: none stated   Cognition  Cognition Arousal/Alertness: Lethargic Behavior During Therapy: Flat affect Overall Cognitive Status: History of cognitive impairments - at baseline Area of Impairment: Orientation;Memory;Problem solving;Safety/judgement Orientation Level: Place;Time Memory: Decreased recall of precautions;Decreased short-term memory Safety/Judgement: Decreased awareness of safety;Decreased awareness of deficits Problem Solving: Slow processing;Decreased initiation;Difficulty sequencing;Requires verbal cues;Requires tactile cues General Comments: pt continues to demo decr congition and has difficulty with sequencing. pt is a fall risk due to decr cognition    Balance  Balance Balance Assessed: Yes Static Standing Balance Static Standing - Balance Support: Left upper extremity supported;During functional activity Static Standing - Level of Assistance: 4: Min assist;3: Mod assist  End of Session PT - End of Session Equipment Utilized During Treatment: Gait belt Activity Tolerance: Patient tolerated treatment well Patient left: in bed;with call bell/phone within reach;with family/visitor present Nurse Communication: Mobility status;Patient requests pain meds   GP     Donell Sievert, [place] [phone] 05/19/2013, 2:20 PM

## 2013-05-19 NOTE — Progress Notes (Signed)
Orthopedics Progress Note  Subjective: I feel tired. Denies SOB  Objective:  Filed Vitals:   05/19/13 1300  BP: 89/42  Pulse: 68  Temp: 98.2 F (36.8 C)  Resp: 18    General: Awake and alert  Conjuctiva is pale Neurovascularly intact  Lab Results  Component Value Date   WBC 5.6 05/19/2013   HGB 8.3* 05/19/2013   HCT 24.9* 05/19/2013   MCV 82.5 05/19/2013   PLT 125* 05/19/2013       Component Value Date/Time   NA 136 05/19/2013 1455   K 4.0 05/19/2013 1455   CL 104 05/19/2013 1455   CO2 26 05/19/2013 1455   GLUCOSE 114* 05/19/2013 1455   BUN 9 05/19/2013 1455   CREATININE 0.85 05/19/2013 1455   CREATININE 0.87 03/21/2013 1356   CALCIUM 8.3* 05/19/2013 1455   GFRNONAA 68* 05/19/2013 1455   GFRAA 79* 05/19/2013 1455    Lab Results  Component Value Date   INR 1.04 08/03/2010   INR 1.1 03/05/2009   INR 0.9 11/16/2006    Assessment/Plan:  RIGH  REVERSE TOTAL SHOULDER ARTHROPLASTY  Symptomatic Anemia - will transfuse (acute blood loss anemia) Hold D/C until tomorrow.  Patient and family understand.  Almedia Balls. Ranell Patrick, MD 05/19/2013 4:17 PM

## 2013-05-20 ENCOUNTER — Encounter (HOSPITAL_COMMUNITY): Payer: Self-pay | Admitting: Orthopedic Surgery

## 2013-05-20 LAB — TYPE AND SCREEN
ABO/RH(D): B POS
Antibody Screen: NEGATIVE
Unit division: 0

## 2013-05-20 NOTE — Progress Notes (Signed)
Orthopedics Progress Note  Subjective: I feel better this morning  Objective:  Filed Vitals:   05/20/13 0543  BP: 116/64  Pulse: 73  Temp: 98.4 F (36.9 C)  Resp: 19    General: Awake and alert  Musculoskeletal: right shoulder dressing CDI, NVI Neurovascularly intact  Lab Results  Component Value Date   WBC 5.6 05/19/2013   HGB 8.3* 05/19/2013   HCT 24.9* 05/19/2013   MCV 82.5 05/19/2013   PLT 125* 05/19/2013       Component Value Date/Time   NA 136 05/19/2013 1455   K 4.0 05/19/2013 1455   CL 104 05/19/2013 1455   CO2 26 05/19/2013 1455   GLUCOSE 114* 05/19/2013 1455   BUN 9 05/19/2013 1455   CREATININE 0.85 05/19/2013 1455   CREATININE 0.87 03/21/2013 1356   CALCIUM 8.3* 05/19/2013 1455   GFRNONAA 68* 05/19/2013 1455   GFRAA 79* 05/19/2013 1455    Lab Results  Component Value Date   INR 1.04 08/03/2010   INR 1.1 03/05/2009   INR 0.9 11/16/2006    Assessment/Plan: POD 4 s/p Procedure(s): RIGHT REVERSE TOTAL SHOULDER ARTHROPLASTY  S/P Blood transfusion, feeling better Plan D/C to home  Viviann Spare R. Ranell Patrick, MD 05/20/2013 7:28 AM

## 2013-05-20 NOTE — Progress Notes (Signed)
Physical Therapy Treatment Patient Details Name: MCKINLEE DUNK MRN: [id number] DOB: 31-Oct-1945 Today's Date: 05/20/2013 Time: 4098-1191 PT Time Calculation (min): 11 min  PT Assessment / Plan / Recommendation  History of Present Illness Patient is s/p R shoulder reverse total shoulder arthroplasty by Dr. Ranell Patrick.   PT Comments   Patient is planning to DC home today per MD notes. Husband not room at this time but has spoke with therapist previously about assistance needed. Patient husband currently refusing SNF although patient is at high fall risk due to cognition and assistnace needed with mobility  Follow Up Recommendations  Home health PT;Supervision/Assistance - 24 hour;Other (comment)     Does the patient have the potential to tolerate intense rehabilitation     Barriers to Discharge        Equipment Recommendations  None recommended by PT;Other (comment)    Recommendations for Other Services    Frequency Min 4X/week   Progress towards PT Goals Progress towards PT goals: Progressing toward goals  Plan Current plan remains appropriate    Precautions / Restrictions Precautions Precautions: Fall;Shoulder Shoulder Interventions: Shoulder sling/immobilizer;For comfort Precaution Comments: husband reports that pt is allowed to take sling off to walk  Required Braces or Orthoses: Sling Restrictions RUE Weight Bearing: Non weight bearing   Pertinent Vitals/Pain Complained of pain but did not rate RN provided medication to assist with pain control     Mobility  Bed Mobility Sit to Supine: HOB flat;With rail;4: Min assist Details for Bed Mobility Assistance: A with LEs back into bed. CUes for positioning back into bed Transfers Sit to Stand: 4: Min assist;With upper extremity assist;From chair/3-in-1 Stand to Sit: 4: Min assist;To bed;With upper extremity assist Details for Transfer Assistance: Patient continues to be unsteady with transfers. A for balance. Cues for  technique Ambulation/Gait Ambulation/Gait Assistance: 4: Min assist Ambulation Distance (Feet): 30 Feet Assistive device: Straight cane Ambulation/Gait Assistance Details: Patient a little more steady this session however still needs min A to ensure balance and safe use of cane with ambulation. Conintue to lean to R side.  Gait Pattern: Wide base of support;Trunk flexed;Shuffle Gait velocity: decreased    Exercises     PT Diagnosis:    PT Problem List:   PT Treatment Interventions:     PT Goals (current goals can now be found in the care plan section)    Visit Information  Last PT Received On: 05/20/13 Assistance Needed: +1 History of Present Illness: Patient is s/p R shoulder reverse total shoulder arthroplasty by Dr. Ranell Patrick.    Subjective Data      Cognition  Cognition Arousal/Alertness: Awake/alert Behavior During Therapy: WFL for tasks assessed/performed Overall Cognitive Status: History of cognitive impairments - at baseline Area of Impairment: Orientation;Memory;Problem solving;Safety/judgement Memory: Decreased recall of precautions;Decreased short-term memory General Comments: pt continues to demo decr congition and has difficulty with sequencing. pt is a fall risk due to decr cognition    Balance     End of Session PT - End of Session Equipment Utilized During Treatment: Gait belt Activity Tolerance: Patient tolerated treatment well Patient left: in bed;with call bell/phone within reach;with family/visitor present   GP     Robinette, Adline Potter 05/20/2013, 2:51 PM 05/20/2013 Fredrich Birks PTA [phone] pager [phone] office

## 2013-05-22 DIAGNOSIS — IMO0001 Reserved for inherently not codable concepts without codable children: Secondary | ICD-10-CM | POA: Diagnosis not present

## 2013-05-22 DIAGNOSIS — Z471 Aftercare following joint replacement surgery: Secondary | ICD-10-CM | POA: Diagnosis not present

## 2013-05-22 DIAGNOSIS — Z96619 Presence of unspecified artificial shoulder joint: Secondary | ICD-10-CM | POA: Diagnosis not present

## 2013-05-22 DIAGNOSIS — S42213A Unspecified displaced fracture of surgical neck of unspecified humerus, initial encounter for closed fracture: Secondary | ICD-10-CM | POA: Diagnosis not present

## 2013-05-26 DIAGNOSIS — Z471 Aftercare following joint replacement surgery: Secondary | ICD-10-CM | POA: Diagnosis not present

## 2013-05-26 DIAGNOSIS — IMO0001 Reserved for inherently not codable concepts without codable children: Secondary | ICD-10-CM | POA: Diagnosis not present

## 2013-05-26 DIAGNOSIS — Z96619 Presence of unspecified artificial shoulder joint: Secondary | ICD-10-CM | POA: Diagnosis not present

## 2013-05-28 DIAGNOSIS — Z471 Aftercare following joint replacement surgery: Secondary | ICD-10-CM | POA: Diagnosis not present

## 2013-05-28 DIAGNOSIS — IMO0001 Reserved for inherently not codable concepts without codable children: Secondary | ICD-10-CM | POA: Diagnosis not present

## 2013-05-28 DIAGNOSIS — Z96619 Presence of unspecified artificial shoulder joint: Secondary | ICD-10-CM | POA: Diagnosis not present

## 2013-05-29 DIAGNOSIS — S42213A Unspecified displaced fracture of surgical neck of unspecified humerus, initial encounter for closed fracture: Secondary | ICD-10-CM | POA: Diagnosis not present

## 2013-05-30 DIAGNOSIS — IMO0001 Reserved for inherently not codable concepts without codable children: Secondary | ICD-10-CM | POA: Diagnosis not present

## 2013-05-30 DIAGNOSIS — Z96619 Presence of unspecified artificial shoulder joint: Secondary | ICD-10-CM | POA: Diagnosis not present

## 2013-05-30 DIAGNOSIS — Z471 Aftercare following joint replacement surgery: Secondary | ICD-10-CM | POA: Diagnosis not present

## 2013-06-02 DIAGNOSIS — Z96619 Presence of unspecified artificial shoulder joint: Secondary | ICD-10-CM | POA: Diagnosis not present

## 2013-06-02 DIAGNOSIS — Z471 Aftercare following joint replacement surgery: Secondary | ICD-10-CM | POA: Diagnosis not present

## 2013-06-02 DIAGNOSIS — IMO0001 Reserved for inherently not codable concepts without codable children: Secondary | ICD-10-CM | POA: Diagnosis not present

## 2013-06-06 DIAGNOSIS — Z471 Aftercare following joint replacement surgery: Secondary | ICD-10-CM | POA: Diagnosis not present

## 2013-06-06 DIAGNOSIS — Z96619 Presence of unspecified artificial shoulder joint: Secondary | ICD-10-CM | POA: Diagnosis not present

## 2013-06-06 DIAGNOSIS — IMO0001 Reserved for inherently not codable concepts without codable children: Secondary | ICD-10-CM | POA: Diagnosis not present

## 2013-06-09 DIAGNOSIS — IMO0001 Reserved for inherently not codable concepts without codable children: Secondary | ICD-10-CM | POA: Diagnosis not present

## 2013-06-09 DIAGNOSIS — Z96619 Presence of unspecified artificial shoulder joint: Secondary | ICD-10-CM | POA: Diagnosis not present

## 2013-06-09 DIAGNOSIS — Z471 Aftercare following joint replacement surgery: Secondary | ICD-10-CM | POA: Diagnosis not present

## 2013-06-11 DIAGNOSIS — Z471 Aftercare following joint replacement surgery: Secondary | ICD-10-CM | POA: Diagnosis not present

## 2013-06-11 DIAGNOSIS — IMO0001 Reserved for inherently not codable concepts without codable children: Secondary | ICD-10-CM | POA: Diagnosis not present

## 2013-06-11 DIAGNOSIS — Z96619 Presence of unspecified artificial shoulder joint: Secondary | ICD-10-CM | POA: Diagnosis not present

## 2013-06-17 DIAGNOSIS — IMO0001 Reserved for inherently not codable concepts without codable children: Secondary | ICD-10-CM | POA: Diagnosis not present

## 2013-06-17 DIAGNOSIS — Z96619 Presence of unspecified artificial shoulder joint: Secondary | ICD-10-CM | POA: Diagnosis not present

## 2013-06-17 DIAGNOSIS — Z471 Aftercare following joint replacement surgery: Secondary | ICD-10-CM | POA: Diagnosis not present

## 2013-06-20 DIAGNOSIS — Z96619 Presence of unspecified artificial shoulder joint: Secondary | ICD-10-CM | POA: Diagnosis not present

## 2013-06-20 DIAGNOSIS — Z471 Aftercare following joint replacement surgery: Secondary | ICD-10-CM | POA: Diagnosis not present

## 2013-06-20 DIAGNOSIS — IMO0001 Reserved for inherently not codable concepts without codable children: Secondary | ICD-10-CM | POA: Diagnosis not present

## 2013-06-25 DIAGNOSIS — IMO0001 Reserved for inherently not codable concepts without codable children: Secondary | ICD-10-CM | POA: Diagnosis not present

## 2013-06-25 DIAGNOSIS — Z96619 Presence of unspecified artificial shoulder joint: Secondary | ICD-10-CM | POA: Diagnosis not present

## 2013-06-25 DIAGNOSIS — Z471 Aftercare following joint replacement surgery: Secondary | ICD-10-CM | POA: Diagnosis not present

## 2013-06-26 DIAGNOSIS — S42213A Unspecified displaced fracture of surgical neck of unspecified humerus, initial encounter for closed fracture: Secondary | ICD-10-CM | POA: Diagnosis not present

## 2013-07-01 DIAGNOSIS — Z471 Aftercare following joint replacement surgery: Secondary | ICD-10-CM | POA: Diagnosis not present

## 2013-07-01 DIAGNOSIS — IMO0001 Reserved for inherently not codable concepts without codable children: Secondary | ICD-10-CM | POA: Diagnosis not present

## 2013-07-01 DIAGNOSIS — Z96619 Presence of unspecified artificial shoulder joint: Secondary | ICD-10-CM | POA: Diagnosis not present

## 2013-07-02 DIAGNOSIS — Z471 Aftercare following joint replacement surgery: Secondary | ICD-10-CM | POA: Diagnosis not present

## 2013-07-02 DIAGNOSIS — Z96619 Presence of unspecified artificial shoulder joint: Secondary | ICD-10-CM | POA: Diagnosis not present

## 2013-07-02 DIAGNOSIS — IMO0001 Reserved for inherently not codable concepts without codable children: Secondary | ICD-10-CM | POA: Diagnosis not present

## 2013-07-03 DIAGNOSIS — Z79899 Other long term (current) drug therapy: Secondary | ICD-10-CM | POA: Diagnosis not present

## 2013-07-03 DIAGNOSIS — IMO0001 Reserved for inherently not codable concepts without codable children: Secondary | ICD-10-CM | POA: Diagnosis not present

## 2013-07-03 DIAGNOSIS — Z471 Aftercare following joint replacement surgery: Secondary | ICD-10-CM | POA: Diagnosis not present

## 2013-07-03 DIAGNOSIS — Z96619 Presence of unspecified artificial shoulder joint: Secondary | ICD-10-CM | POA: Diagnosis not present

## 2013-07-07 DIAGNOSIS — Z471 Aftercare following joint replacement surgery: Secondary | ICD-10-CM | POA: Diagnosis not present

## 2013-07-07 DIAGNOSIS — Z96619 Presence of unspecified artificial shoulder joint: Secondary | ICD-10-CM | POA: Diagnosis not present

## 2013-07-07 DIAGNOSIS — IMO0001 Reserved for inherently not codable concepts without codable children: Secondary | ICD-10-CM | POA: Diagnosis not present

## 2013-07-09 DIAGNOSIS — Z471 Aftercare following joint replacement surgery: Secondary | ICD-10-CM | POA: Diagnosis not present

## 2013-07-09 DIAGNOSIS — IMO0001 Reserved for inherently not codable concepts without codable children: Secondary | ICD-10-CM | POA: Diagnosis not present

## 2013-07-09 DIAGNOSIS — Z96619 Presence of unspecified artificial shoulder joint: Secondary | ICD-10-CM | POA: Diagnosis not present

## 2013-07-10 ENCOUNTER — Other Ambulatory Visit: Payer: Self-pay | Admitting: Dermatology

## 2013-07-10 ENCOUNTER — Other Ambulatory Visit: Payer: Self-pay

## 2013-07-10 DIAGNOSIS — D485 Neoplasm of uncertain behavior of skin: Secondary | ICD-10-CM | POA: Diagnosis not present

## 2013-07-10 DIAGNOSIS — L821 Other seborrheic keratosis: Secondary | ICD-10-CM | POA: Diagnosis not present

## 2013-07-10 DIAGNOSIS — D239 Other benign neoplasm of skin, unspecified: Secondary | ICD-10-CM | POA: Diagnosis not present

## 2013-07-10 DIAGNOSIS — L570 Actinic keratosis: Secondary | ICD-10-CM | POA: Diagnosis not present

## 2013-07-24 DIAGNOSIS — S42213A Unspecified displaced fracture of surgical neck of unspecified humerus, initial encounter for closed fracture: Secondary | ICD-10-CM | POA: Diagnosis not present

## 2013-08-08 DIAGNOSIS — F4323 Adjustment disorder with mixed anxiety and depressed mood: Secondary | ICD-10-CM | POA: Diagnosis not present

## 2013-08-13 ENCOUNTER — Encounter: Payer: Self-pay | Admitting: Family Medicine

## 2013-09-03 DIAGNOSIS — S42213A Unspecified displaced fracture of surgical neck of unspecified humerus, initial encounter for closed fracture: Secondary | ICD-10-CM | POA: Diagnosis not present

## 2013-09-03 DIAGNOSIS — Z9889 Other specified postprocedural states: Secondary | ICD-10-CM | POA: Diagnosis not present

## 2013-09-03 DIAGNOSIS — Z23 Encounter for immunization: Secondary | ICD-10-CM | POA: Diagnosis not present

## 2013-09-10 ENCOUNTER — Other Ambulatory Visit: Payer: Self-pay | Admitting: Dermatology

## 2013-09-10 DIAGNOSIS — D485 Neoplasm of uncertain behavior of skin: Secondary | ICD-10-CM | POA: Diagnosis not present

## 2013-09-16 DIAGNOSIS — G894 Chronic pain syndrome: Secondary | ICD-10-CM | POA: Diagnosis not present

## 2013-09-16 DIAGNOSIS — Z79899 Other long term (current) drug therapy: Secondary | ICD-10-CM | POA: Diagnosis not present

## 2013-09-16 DIAGNOSIS — M545 Low back pain, unspecified: Secondary | ICD-10-CM | POA: Diagnosis not present

## 2013-09-16 DIAGNOSIS — S42213A Unspecified displaced fracture of surgical neck of unspecified humerus, initial encounter for closed fracture: Secondary | ICD-10-CM | POA: Diagnosis not present

## 2013-09-17 DIAGNOSIS — R413 Other amnesia: Secondary | ICD-10-CM | POA: Diagnosis not present

## 2013-09-17 DIAGNOSIS — R209 Unspecified disturbances of skin sensation: Secondary | ICD-10-CM | POA: Diagnosis not present

## 2013-09-17 DIAGNOSIS — G43709 Chronic migraine without aura, not intractable, without status migrainosus: Secondary | ICD-10-CM | POA: Diagnosis not present

## 2013-12-29 ENCOUNTER — Other Ambulatory Visit: Payer: Self-pay | Admitting: *Deleted

## 2013-12-29 MED ORDER — MELOXICAM 7.5 MG PO TABS: 7.5000 mg | ORAL_TABLET | Freq: Every day | ORAL | Status: DC

## 2014-01-30 ENCOUNTER — Ambulatory Visit (INDEPENDENT_AMBULATORY_CARE_PROVIDER_SITE_OTHER): Payer: Medicare Other | Admitting: Family Medicine

## 2014-01-30 ENCOUNTER — Encounter: Payer: Self-pay | Admitting: Family Medicine

## 2014-01-30 VITALS — BP 92/62 | HR 101 | Temp 98.0°F | Wt 214.0 lb

## 2014-01-30 DIAGNOSIS — E669 Obesity, unspecified: Secondary | ICD-10-CM | POA: Diagnosis not present

## 2014-01-30 DIAGNOSIS — F339 Major depressive disorder, recurrent, unspecified: Secondary | ICD-10-CM

## 2014-01-30 DIAGNOSIS — E871 Hypo-osmolality and hyponatremia: Secondary | ICD-10-CM

## 2014-01-30 DIAGNOSIS — E039 Hypothyroidism, unspecified: Secondary | ICD-10-CM | POA: Diagnosis not present

## 2014-01-30 DIAGNOSIS — Z96611 Presence of right artificial shoulder joint: Secondary | ICD-10-CM | POA: Insufficient documentation

## 2014-01-30 DIAGNOSIS — Z96619 Presence of unspecified artificial shoulder joint: Secondary | ICD-10-CM

## 2014-01-30 DIAGNOSIS — Z0000 Encounter for general adult medical examination without abnormal findings: Secondary | ICD-10-CM

## 2014-01-30 LAB — COMPREHENSIVE METABOLIC PANEL
ALK PHOS: 90 U/L (ref 39–117)
ALT: 12 U/L (ref 0–35)
AST: 23 U/L (ref 0–37)
Albumin: 4.4 g/dL (ref 3.5–5.2)
BILIRUBIN TOTAL: 0.4 mg/dL (ref 0.2–1.2)
BUN: 19 mg/dL (ref 6–23)
CO2: 21 mEq/L (ref 19–32)
CREATININE: 1.05 mg/dL (ref 0.50–1.10)
Calcium: 8.8 mg/dL (ref 8.4–10.5)
Chloride: 105 mEq/L (ref 96–112)
Glucose, Bld: 91 mg/dL (ref 70–99)
Potassium: 4.1 mEq/L (ref 3.5–5.3)
Sodium: 137 mEq/L (ref 135–145)
Total Protein: 7 g/dL (ref 6.0–8.3)

## 2014-01-30 LAB — LIPID PANEL
CHOL/HDL RATIO: 2.7 ratio
Cholesterol: 195 mg/dL (ref 0–200)
HDL: 71 mg/dL (ref >39)
LDL Cholesterol: 89 mg/dL (ref 0–99)
Triglycerides: 176 mg/dL — ABNORMAL HIGH (ref <150)
VLDL: 35 mg/dL (ref 0–40)

## 2014-01-30 MED ORDER — TETANUS-DIPHTH-ACELL PERTUSSIS 5-2.5-18.5 LF-MCG/0.5 IM SUSP: 0.5000 mL | Freq: Once | INTRAMUSCULAR | Status: DC

## 2014-01-30 MED ORDER — ZOSTER VACCINE LIVE 19400 UNT/0.65ML ~~LOC~~ SOLR: 0.6500 mL | Freq: Once | SUBCUTANEOUS | Status: DC

## 2014-01-30 NOTE — Assessment & Plan Note (Addendum)
Compliant with meds check TSH  Lab Results  Component Value Date   TSH 0.123* 01/30/2014  plan to step down synthroid dose.

## 2014-01-30 NOTE — Patient Instructions (Signed)
Mr and Mrs. Kuna,  Thank you for coming in today for health maintenance exam. It was a pleasure seeing you both.   To do list: 1. Mammogram 2. Stool cards 3. zostavax and Tdap  I will be in touch with lab results.  Please schedule f/u for knee injections at your earliest convenience. I placed PT referral for R shoulder rehab. You will be called about appt.  Dr. Adrian Blackwater

## 2014-01-30 NOTE — Assessment & Plan Note (Signed)
To do list: 1. Mammogram 2. Stool cards 3. zostavax and Tdap

## 2014-01-30 NOTE — Assessment & Plan Note (Signed)
On cymbalta check lipids

## 2014-01-30 NOTE — Assessment & Plan Note (Signed)
Referral to PT to hopefully improve ROM

## 2014-01-30 NOTE — Progress Notes (Signed)
   Subjective:    Patient ID: [name], female    DOB: 04-14-1946, 68 y.o.   MRN: [id number] CC: f/u annual exam, with a few concerns.  HPI 68 yo F presents with husband for annual exam and the following concerns:  1. R shoulder pain: s/p surgery in 9/13. Improvement in pain from surgery. Would do it again. Able to perform ADLs. Still with limited ROM in R shoulder. On daily narcotics.   2. B/l knee pain: b/l knee pain R>L. No swelling or erythema. No locking or popping. Some giving away. Would like corticosteroid injections at next visit.   3. HM: due for colon cancer screen prefers stool card as she has IBS. Due for mammogram. Due for zostavax and TDAP. Due for lipids on cymbalta. Due to TSH on synthroid.   Soc Hx: non-smoker  Review of Systems As per HPI  Denies HA, vision changes, palpitations, GI upset, chest pain and SOB. Admits to chronic sweating especially when moving from cool to hot environment.     Objective:   Physical Exam BP 92/62  Pulse 101  Temp(Src) 98 F (36.7 C) (Oral)  Wt 214 lb (97.07 kg) General appearance: alert, cooperative and no distress, diaphoretic  Head: Normocephalic, without obvious abnormality, atraumatic Eyes: conjunctivae/corneas clear. PERRL, EOM's intact.  Ears: normal TM's and external ear canals both ears Nose: Nares normal. Septum midline. Mucosa normal. No drainage or sinus tenderness. Throat: lips, mucosa, and tongue normal; teeth and gums normal Neck: no adenopathy, no carotid bruit, no JVD, supple, symmetrical, trachea midline and thyroid not enlarged, symmetric, no tenderness/mass/nodules Lungs: clear to auscultation bilaterally Heart: regular rate and rhythm, S1, S2 normal, no murmur, click, rub or gallop Abdomen: soft, non-tender; bowel sounds normal; no masses,  no organomegaly Extremities: extremities normal, atraumatic, no cyanosis or edema Pulses: 2+ and symmetric Skin: Skin color, texture, turgor normal. No rashes or  lesions Knees: full ROM, no erythema or edema or crepitus.  R shoulder: abduction to 90 degrees, unable to touch hair.      Assessment & Plan:

## 2014-01-30 NOTE — Assessment & Plan Note (Signed)
Repeat CMP patient no longer on lasix since 9/14.

## 2014-01-31 LAB — TSH: TSH: 0.123 u[IU]/mL — AB (ref 0.350–4.500)

## 2014-02-02 ENCOUNTER — Telehealth: Payer: Self-pay | Admitting: Family Medicine

## 2014-02-02 ENCOUNTER — Encounter: Payer: Self-pay | Admitting: Family Medicine

## 2014-02-02 LAB — HM MAMMOGRAPHY

## 2014-02-02 MED ORDER — LEVOTHYROXINE SODIUM 100 MCG PO TABS: 100.0000 ug | ORAL_TABLET | Freq: Every day | ORAL | Status: DC

## 2014-02-02 NOTE — Telephone Encounter (Signed)
Please call patient  Reviewed labs.  Normal CMP. Slightly low TSH, recommend decreasing synthroid dose, diose decreased from 112 mcg to 100 mcg daily, f/u in 8 weeks to meet new PCP and repeat TSH.  Slightly elevated TG, not significantly so, no need for new medication. Be mindful of eating good fats, fish, vegetables and limit red meat and fried foods.

## 2014-02-02 NOTE — Addendum Note (Signed)
Addended by: Boykin Nearing on: 02/02/2014 09:22 AM   Modules accepted: Orders

## 2014-02-02 NOTE — Telephone Encounter (Signed)
Pt informed and sent info on triglycerides. [name]

## 2014-02-04 ENCOUNTER — Other Ambulatory Visit: Payer: Self-pay | Admitting: *Deleted

## 2014-02-04 MED ORDER — MELOXICAM 7.5 MG PO TABS: 7.5000 mg | ORAL_TABLET | Freq: Every day | ORAL | Status: DC

## 2014-02-23 ENCOUNTER — Other Ambulatory Visit: Payer: Self-pay | Admitting: *Deleted

## 2014-02-23 MED ORDER — MELOXICAM 7.5 MG PO TABS: 7.5000 mg | ORAL_TABLET | Freq: Every day | ORAL | Status: DC

## 2014-03-11 DIAGNOSIS — M1710 Unilateral primary osteoarthritis, unspecified knee: Secondary | ICD-10-CM | POA: Diagnosis not present

## 2014-03-25 ENCOUNTER — Ambulatory Visit (HOSPITAL_COMMUNITY)
Admission: RE | Admit: 2014-03-25 | Discharge: 2014-03-25 | Disposition: A | Discharge status: Home / Self Care | Payer: Medicare Other | Source: Ambulatory Visit | Attending: Family Medicine | Admitting: Family Medicine

## 2014-03-25 ENCOUNTER — Encounter: Payer: Self-pay | Admitting: Family Medicine

## 2014-03-25 ENCOUNTER — Ambulatory Visit (INDEPENDENT_AMBULATORY_CARE_PROVIDER_SITE_OTHER): Payer: Medicare Other | Admitting: Family Medicine

## 2014-03-25 VITALS — BP 131/88 | HR 64 | Temp 97.9°F | Ht 65.0 in | Wt 214.0 lb

## 2014-03-25 DIAGNOSIS — M19079 Primary osteoarthritis, unspecified ankle and foot: Secondary | ICD-10-CM | POA: Insufficient documentation

## 2014-03-25 DIAGNOSIS — S99922S Unspecified injury of left foot, sequela: Secondary | ICD-10-CM

## 2014-03-25 DIAGNOSIS — M25579 Pain in unspecified ankle and joints of unspecified foot: Secondary | ICD-10-CM | POA: Diagnosis not present

## 2014-03-25 DIAGNOSIS — IMO0001 Reserved for inherently not codable concepts without codable children: Secondary | ICD-10-CM | POA: Diagnosis present

## 2014-03-25 DIAGNOSIS — M899 Disorder of bone, unspecified: Secondary | ICD-10-CM | POA: Diagnosis not present

## 2014-03-25 DIAGNOSIS — M949 Disorder of cartilage, unspecified: Secondary | ICD-10-CM

## 2014-03-25 DIAGNOSIS — S8992XS Unspecified injury of left lower leg, sequela: Secondary | ICD-10-CM

## 2014-03-25 DIAGNOSIS — M79609 Pain in unspecified limb: Secondary | ICD-10-CM | POA: Diagnosis not present

## 2014-03-25 DIAGNOSIS — S8990XA Unspecified injury of unspecified lower leg, initial encounter: Secondary | ICD-10-CM | POA: Insufficient documentation

## 2014-03-25 DIAGNOSIS — S99929A Unspecified injury of unspecified foot, initial encounter: Secondary | ICD-10-CM | POA: Diagnosis not present

## 2014-03-25 MED ORDER — GABAPENTIN 100 MG PO CAPS: 100.0000 mg | ORAL_CAPSULE | Freq: Three times a day (TID) | ORAL | Status: DC

## 2014-03-25 NOTE — Assessment & Plan Note (Signed)
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 This is new since the study of June 30, 2006. This may reflect sequelae of previous injury that has healed  with deformity although active pathology is not excluded." --> referral to ortho for potential MRI

## 2014-03-25 NOTE — Patient Instructions (Signed)
Ms Sortino it was great to see you today!  I am so sorry to hear about your foot Let's plan to get xrays of the foot and ankle to look for potential bone injury For your nerve discomfort you can take the medicine gabapentin up to 3 times a day for relief If things do not get better in the next 2-3 days we can consider sending you to the neurologist for some nerve testing.  Looking forward to seeing you soon Bernadene Bell, MD

## 2014-03-25 NOTE — Progress Notes (Signed)
Patient ID: [name], female   DOB: [birth year], 68 y.o.   MRN: [id number]   [facility] Bernadene Bell, MD Phone: [phone]  Subjective:  [name] is a 68 y.o F who presents a as SDA for a recent fall  # fell in tub -pt was sitting at side of tub cleaning her feet when she fell forward into bath (this occurred 03/18/14) -was compressing her feet at that time -calling for help but her husband did not hear her for over 1 hrs -after that time period it took at least 3 hrs to get back in bed -pt experiencing numbness in left toes persistently in addition to sharp pains down her foot  All systems were reviewed and were negative unless otherwise noted in the HPI  Past Medical History Patient Active Problem List   Diagnosis Date Noted  . Obesity (BMI 30.0-34.9) 01/30/2014  . Health care maintenance 01/30/2014  . 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  . Diastolic CHF 51/76/1607  . Fibromyalgia 08/14/2012  . GERD with stricture 03/04/2012  . Leg edema 03/04/2012  . CATARACT, LEFT EYE 08/23/2010  . Abnormality of gait 08/23/2010  . 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 ANEMIA, IRON DEFICIENCY 03/06/2009  . 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 SYMPTOM, INCONTINENCE W/O SENSORY AWARENESS 12/26/2006  . Memory loss 12/26/2006  . HYPOTHYROIDISM, UNSPECIFIED 11/01/2006  . DEPRESSION, MAJOR, RECURRENT 11/01/2006  . PANIC ATTACKS 11/01/2006  . Migraine headache 11/01/2006  . Carpal tunnel syndrome 11/01/2006  . 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 Irritable bowel syndrome 11/01/2006  . OSTEOPENIA 11/01/2006   Reviewed problem list.  Medications- reviewed and updated Chief complaint-noted No additions to family history Social history- patient is a never smoker  Objective: BP 131/88  Pulse 64  Temp(Src) 97.9 F (36.6 C) (Oral)  Ht 5\' 5"  (1.651 m)  Wt 214 lb (97.07 kg)  BMI 35.61 kg/m2 Gen: NAD, alert, cooperative with exam HEENT:  NCAT, EOMI, PERRL Neck: FROM, supple CV: DP 1+ bilaterally  Ext: mild edema over forefoot, warm, normal tone, moves UE/LE spontaneously but cautiously; pinpoint tender at lateral high ankle, discomfort with dorsiflexion otherwise ROM in tact Neuro: Alert and oriented, abnormal proprioception in great toe on left side; decreased sensation to light touch over plantar surface;  Skin: no rashes no lesions  Assessment/Plan: See problem based a/p  Husbands phone number: [phone]

## 2014-03-27 DIAGNOSIS — IMO0002 Reserved for concepts with insufficient information to code with codable children: Secondary | ICD-10-CM | POA: Diagnosis not present

## 2014-03-27 DIAGNOSIS — M25579 Pain in unspecified ankle and joints of unspecified foot: Secondary | ICD-10-CM | POA: Diagnosis not present

## 2014-04-06 ENCOUNTER — Other Ambulatory Visit: Payer: Self-pay | Admitting: *Deleted

## 2014-04-06 MED ORDER — MELOXICAM 7.5 MG PO TABS: 7.5000 mg | ORAL_TABLET | Freq: Every day | ORAL | Status: DC

## 2014-04-14 DIAGNOSIS — M1710 Unilateral primary osteoarthritis, unspecified knee: Secondary | ICD-10-CM | POA: Diagnosis not present

## 2014-04-14 DIAGNOSIS — M21372 Foot drop, left foot: Secondary | ICD-10-CM | POA: Insufficient documentation

## 2014-04-14 DIAGNOSIS — G43839 Menstrual migraine, intractable, without status migrainosus: Secondary | ICD-10-CM | POA: Diagnosis not present

## 2014-04-14 DIAGNOSIS — M216X9 Other acquired deformities of unspecified foot: Secondary | ICD-10-CM | POA: Diagnosis not present

## 2014-04-14 DIAGNOSIS — G4700 Insomnia, unspecified: Secondary | ICD-10-CM | POA: Diagnosis not present

## 2014-04-14 DIAGNOSIS — G43709 Chronic migraine without aura, not intractable, without status migrainosus: Secondary | ICD-10-CM | POA: Diagnosis not present

## 2014-04-14 DIAGNOSIS — IMO0001 Reserved for inherently not codable concepts without codable children: Secondary | ICD-10-CM | POA: Diagnosis not present

## 2014-04-15 DIAGNOSIS — IMO0002 Reserved for concepts with insufficient information to code with codable children: Secondary | ICD-10-CM | POA: Diagnosis not present

## 2014-04-15 DIAGNOSIS — M25579 Pain in unspecified ankle and joints of unspecified foot: Secondary | ICD-10-CM | POA: Diagnosis not present

## 2014-04-27 ENCOUNTER — Ambulatory Visit
Admission: RE | Admit: 2014-04-27 | Discharge: 2014-04-27 | Disposition: A | Discharge status: Home / Self Care | Payer: Medicare Other | Source: Ambulatory Visit | Attending: Family Medicine | Admitting: Family Medicine

## 2014-04-27 ENCOUNTER — Encounter: Payer: Self-pay | Admitting: Family Medicine

## 2014-04-27 ENCOUNTER — Ambulatory Visit (INDEPENDENT_AMBULATORY_CARE_PROVIDER_SITE_OTHER): Payer: Medicare Other | Admitting: Family Medicine

## 2014-04-27 VITALS — HR 62 | Temp 98.1°F | Ht 64.0 in | Wt 223.0 lb

## 2014-04-27 DIAGNOSIS — R071 Chest pain on breathing: Secondary | ICD-10-CM | POA: Diagnosis not present

## 2014-04-27 DIAGNOSIS — R0789 Other chest pain: Secondary | ICD-10-CM | POA: Insufficient documentation

## 2014-04-27 DIAGNOSIS — S6991XA Unspecified injury of right wrist, hand and finger(s), initial encounter: Secondary | ICD-10-CM

## 2014-04-27 DIAGNOSIS — S2249XA Multiple fractures of ribs, unspecified side, initial encounter for closed fracture: Secondary | ICD-10-CM | POA: Diagnosis not present

## 2014-04-27 DIAGNOSIS — M79609 Pain in unspecified limb: Secondary | ICD-10-CM | POA: Diagnosis not present

## 2014-04-27 DIAGNOSIS — S6990XA Unspecified injury of unspecified wrist, hand and finger(s), initial encounter: Secondary | ICD-10-CM | POA: Diagnosis not present

## 2014-04-27 HISTORY — DX: Unspecified injury of right wrist, hand and finger(s), initial encounter: S69.91XA

## 2014-04-27 NOTE — Assessment & Plan Note (Addendum)
Obtain two view of the hand (A/P, lateral) to evaluate for possible fx at the 2nd/3rd [facility].  No scaphoid/snuff box tenderness on exam.  Increase oxycontin use and contact their orthopaedic doctor they have a pain contract with.  With her recurrent falls, she may be a candidate to be evaluated by geriatric team for medication management and prevention.

## 2014-04-27 NOTE — Assessment & Plan Note (Signed)
Will obtain CXR and rib x-ray to evaluate for possible fx.  If is there, recommend conservative management with medications, ice.  F/U PRN.

## 2014-04-27 NOTE — Progress Notes (Signed)
[name] is a 68 y.o. female who presents today for fall.    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  She has had trouble picking up her toes and was trying to maneuver herself around the bathtub today, when she fell onto her left side, with a FOOSH of the R wrist.  She had some pain immediately but denies any SOB or pressure.  She denied any syncope, pre-syncope, or palpitations during the event.  For her R wrist, she had pain on the top of the hand but nothing in the actually wrist joint or at the scaphoid.  She denied any weakness, paresthesias, or loss of ROM.    Past Medical History  Diagnosis Date  . Headache(784.0)   . IBS (irritable bowel syndrome)   . Hypothyroidism   . Mental disorder   . Anxiety     panic attacks   . Depression   . Seizures     caused by depression , seritonin seizure - effected short term memory    . Shortness of breath   . GERD (gastroesophageal reflux disease)     uses Protonix as needed   . Cancer     skin- preCA  . Arthritis     shoulders, knees  . Anemia     took Fe- orally 2 yrs. ago   . Humerus fracture     Right   . Preoperative examination 02/26/2013  . Hyposmolality and/or hyponatremia 02/27/2013    History  Smoking status  . Never Smoker   Smokeless tobacco  . Not on file    Family History  Problem Relation Age of Onset  . Aneurysm Mother   . Cancer Father     pancreatic  . Stroke Father 74  . Heart disease Maternal Grandfather     Current Outpatient Prescriptions on File Prior to Visit  Medication Sig Dispense Refill  . aspirin 81 MG EC tablet Take 81 mg by mouth daily.        . Cholecalciferol (VITAMIN D3) 2000 UNIT capsule Take 2,000 Units by mouth at bedtime.        . clonazePAM (KLONOPIN) 1 MG tablet Take 1 mg by mouth 2 (two) times daily. Per Dr. Toy Care      . Coenzyme Q10 (CO Q-10) 300 MG CAPS Take 1 capsule by mouth daily.        .  DULoxetine (CYMBALTA) 30 MG capsule Take 30-60 mg by mouth 2 (two) times daily. 60 mg in am and 30 mg in pm      . fluticasone (FLONASE) 50 MCG/ACT nasal spray Place 2 sprays into the nose daily.  16 g  5  . frovatriptan (FROVA) 2.5 MG tablet Take 2.5 mg by mouth as needed. If recurs, may repeat after 2 hours. Max of 3 tabs in 24 hours.  Per Dr. Thana Ates       . gabapentin (NEURONTIN) 100 MG capsule Take 1 capsule (100 mg total) by mouth 3 (three) times daily.  30 capsule  0  . levothyroxine (SYNTHROID, LEVOTHROID) 100 MCG tablet Take 1 tablet (100 mcg total) by mouth daily before breakfast.  60 tablet  1  . meloxicam (MOBIC) 7.5 MG tablet Take 1 tablet (7.5 mg total) by mouth daily.  15 tablet  2  . Multiple Vitamins-Minerals (CENTRUM SILVER PO) Take 1 tablet by mouth daily.       Marland Kitchen  Omega-3 Fatty Acids 500 MG CAPS Take 1 capsule by mouth daily.       . ondansetron (ZOFRAN) 4 MG tablet Take 4 mg by mouth every 8 (eight) hours as needed. for migraine per Dr. Collene Mares      . OVER THE COUNTER MEDICATION Take 1 tablet by mouth daily. Tumeric 490 mg BID      . oxyCODONE (OXYCONTIN) 20 MG 12 hr tablet Take 20 mg by mouth 4 (four) times daily. Per Dr. Thana Ates      . oxyCODONE (ROXICODONE) 15 MG immediate release tablet Take 15 mg by mouth every 4 (four) hours as needed for pain.      . pregabalin (LYRICA) 100 MG capsule Take 100 mg by mouth 5 (five) times daily. Per Dr. Payton Spark      . QUEtiapine (SEROQUEL) 50 MG tablet Take 50 mg by mouth at bedtime.        . ramelteon (ROZEREM) 8 MG tablet Take 8 mg by mouth at bedtime. Per Dr. Toy Care       . S-Adenosylmethionine (SAM-E) 200 MG TABS Take 2 tablets by mouth daily. Per patient      . Tdap (BOOSTRIX) 5-2.5-18.5 LF-MCG/0.5 injection Inject 0.5 mLs into the muscle once.  0.5 mL  0  . tiZANidine (ZANAFLEX) 4 MG tablet Take 2 mg by mouth 2 (two) times daily. 1/2 tablet every morning, 1/2 tablet at supper per Dr. Thana Ates      . zoster vaccine live, PF,  (ZOSTAVAX) 80321 UNT/0.65ML injection Inject 19,400 Units into the skin once.  1 each  0   No current facility-administered medications on file prior to visit.    ROS: Per HPI.  All other systems reviewed and are negative.   Physical Exam Filed Vitals:   04/27/14 1559  Pulse: 62  Temp: 98.1 F (36.7 C)    Physical Examination: Chest - clear to auscultation, no wheezes, rales or rhonchi, symmetric air entry Heart - normal rate and regular rhythm, no murmurs noted Extremities - R hand dorsal effusion w/ ecchymosis.  TTP at the 2nd/3rd [facility] but no scaphoid tenderness.  MS and ROM both normal and NV intact  Skin - see above Chest Wall - + TTP around the 6-7th lateral rib.      Chemistry      Component Value Date/Time   NA 137 01/30/2014 1418   K 4.1 01/30/2014 1418   CL 105 01/30/2014 1418   CO2 21 01/30/2014 1418   BUN 19 01/30/2014 1418   CREATININE 1.05 01/30/2014 1418   CREATININE 0.85 05/19/2013 1455      Component Value Date/Time   CALCIUM 8.8 01/30/2014 1418   ALKPHOS 90 01/30/2014 1418   AST 23 01/30/2014 1418   ALT 12 01/30/2014 1418   BILITOT 0.4 01/30/2014 1418

## 2014-04-27 NOTE — Patient Instructions (Signed)
Please have your x-rays done.  As well, please call your orthopedic doctor about your pain medications.  Thanks, Dr.  Awanda Mink.

## 2014-04-29 ENCOUNTER — Telehealth: Payer: Self-pay | Admitting: Family Medicine

## 2014-04-29 NOTE — Telephone Encounter (Signed)
Called to let pt know she has two rib fractures on X-ray.  Went to VM and left message to call us back.  Please let her know she has two definite rib fractures on x-ray that we will manage conservatively as long as she is not having shortness of breath or abdominal pain from this.    Thanks, Tamela Oddi. Awanda Mink, DO of [facility] 04/29/2014, 11:19 AM

## 2014-06-02 DIAGNOSIS — Z79899 Other long term (current) drug therapy: Secondary | ICD-10-CM | POA: Diagnosis not present

## 2014-06-02 DIAGNOSIS — G894 Chronic pain syndrome: Secondary | ICD-10-CM | POA: Diagnosis not present

## 2014-06-02 DIAGNOSIS — Z5189 Encounter for other specified aftercare: Secondary | ICD-10-CM | POA: Diagnosis not present

## 2014-06-02 DIAGNOSIS — M545 Low back pain, unspecified: Secondary | ICD-10-CM | POA: Diagnosis not present

## 2014-06-19 ENCOUNTER — Other Ambulatory Visit: Payer: Self-pay | Admitting: *Deleted

## 2014-06-19 MED ORDER — LEVOTHYROXINE SODIUM 100 MCG PO TABS: 100.0000 ug | ORAL_TABLET | Freq: Every day | ORAL | Status: DC

## 2014-07-21 ENCOUNTER — Other Ambulatory Visit: Payer: Self-pay | Admitting: *Deleted

## 2014-07-22 ENCOUNTER — Other Ambulatory Visit: Payer: Self-pay | Admitting: *Deleted

## 2014-07-22 DIAGNOSIS — E038 Other specified hypothyroidism: Secondary | ICD-10-CM

## 2014-07-22 MED ORDER — LEVOTHYROXINE SODIUM 100 MCG PO TABS: 100.0000 ug | ORAL_TABLET | Freq: Every day | ORAL | Status: DC

## 2014-07-22 NOTE — Telephone Encounter (Signed)
Left voice message for pt to call and schedule a lab appt to recheck thyroid function.  Derl Barrow, RN

## 2014-07-22 NOTE — Telephone Encounter (Signed)
Last thyroid test was too high.  Needs to have this checked.  Will prescribe 30 days worth but she needs to come in for lab appointment.  I will put in these orders now. Can you call and alert the patient?

## 2014-08-19 ENCOUNTER — Other Ambulatory Visit: Payer: Self-pay | Admitting: Family Medicine

## 2014-08-19 NOTE — Telephone Encounter (Signed)
Please call patient and alert her that she needs thyroid testing before any further refills will be prescribed.  I have sent in 30 days worth of medicine and placed future order.  She should call and schedule a lab appt.

## 2014-08-20 NOTE — Telephone Encounter (Signed)
Spoke with patient's husband and gave below message

## 2014-09-14 ENCOUNTER — Other Ambulatory Visit: Payer: Medicare Other

## 2014-09-17 ENCOUNTER — Other Ambulatory Visit: Payer: Medicare Other

## 2014-09-17 ENCOUNTER — Ambulatory Visit (INDEPENDENT_AMBULATORY_CARE_PROVIDER_SITE_OTHER): Payer: Medicare Other | Admitting: *Deleted

## 2014-09-17 DIAGNOSIS — E038 Other specified hypothyroidism: Secondary | ICD-10-CM | POA: Diagnosis not present

## 2014-09-17 DIAGNOSIS — Z23 Encounter for immunization: Secondary | ICD-10-CM

## 2014-09-18 LAB — TSH: TSH: 0.425 u[IU]/mL (ref 0.350–4.500)

## 2014-09-19 ENCOUNTER — Other Ambulatory Visit: Payer: Self-pay | Admitting: Family Medicine

## 2014-12-11 DIAGNOSIS — M170 Bilateral primary osteoarthritis of knee: Secondary | ICD-10-CM | POA: Diagnosis not present

## 2015-01-27 DIAGNOSIS — M170 Bilateral primary osteoarthritis of knee: Secondary | ICD-10-CM | POA: Diagnosis not present

## 2015-02-03 DIAGNOSIS — M170 Bilateral primary osteoarthritis of knee: Secondary | ICD-10-CM | POA: Diagnosis not present

## 2015-02-12 DIAGNOSIS — M170 Bilateral primary osteoarthritis of knee: Secondary | ICD-10-CM | POA: Diagnosis not present

## 2015-02-22 ENCOUNTER — Encounter: Payer: Self-pay | Admitting: *Deleted

## 2015-02-26 DIAGNOSIS — G894 Chronic pain syndrome: Secondary | ICD-10-CM | POA: Diagnosis not present

## 2015-02-26 DIAGNOSIS — M5441 Lumbago with sciatica, right side: Secondary | ICD-10-CM | POA: Diagnosis not present

## 2015-02-26 DIAGNOSIS — Z79891 Long term (current) use of opiate analgesic: Secondary | ICD-10-CM | POA: Diagnosis not present

## 2015-03-01 ENCOUNTER — Other Ambulatory Visit: Payer: Self-pay

## 2015-04-05 DIAGNOSIS — F3342 Major depressive disorder, recurrent, in full remission: Secondary | ICD-10-CM | POA: Diagnosis not present

## 2015-04-07 DIAGNOSIS — M170 Bilateral primary osteoarthritis of knee: Secondary | ICD-10-CM | POA: Diagnosis not present

## 2015-04-20 ENCOUNTER — Other Ambulatory Visit: Payer: Self-pay | Admitting: Family Medicine

## 2015-05-06 DIAGNOSIS — M170 Bilateral primary osteoarthritis of knee: Secondary | ICD-10-CM | POA: Diagnosis not present

## 2015-05-24 ENCOUNTER — Other Ambulatory Visit: Payer: Self-pay | Admitting: Family Medicine

## 2015-06-01 ENCOUNTER — Other Ambulatory Visit: Payer: Self-pay | Admitting: Family Medicine

## 2015-06-01 NOTE — Telephone Encounter (Signed)
Appt scheduled for 06/15/15.  Would like enough thyroid med to get her to the appt. Took her last one today

## 2015-06-04 MED ORDER — LEVOTHYROXINE SODIUM 100 MCG PO TABS: ORAL_TABLET | ORAL | Status: DC

## 2015-06-04 NOTE — Addendum Note (Signed)
Addended byMingo Amber, JEFFREY H on: 06/04/2015 11:00 AM   Modules accepted: Orders

## 2015-06-15 ENCOUNTER — Ambulatory Visit: Payer: Medicare Other | Admitting: Family Medicine

## 2015-07-02 ENCOUNTER — Encounter: Payer: Self-pay | Admitting: Family Medicine

## 2015-07-02 ENCOUNTER — Ambulatory Visit (INDEPENDENT_AMBULATORY_CARE_PROVIDER_SITE_OTHER): Payer: Medicare Other | Admitting: Family Medicine

## 2015-07-02 VITALS — BP 102/78 | HR 78 | Temp 97.7°F | Ht 64.0 in | Wt 231.1 lb

## 2015-07-02 DIAGNOSIS — R600 Localized edema: Secondary | ICD-10-CM | POA: Diagnosis not present

## 2015-07-02 DIAGNOSIS — L570 Actinic keratosis: Secondary | ICD-10-CM | POA: Diagnosis not present

## 2015-07-02 DIAGNOSIS — Z23 Encounter for immunization: Secondary | ICD-10-CM | POA: Diagnosis not present

## 2015-07-02 DIAGNOSIS — E039 Hypothyroidism, unspecified: Secondary | ICD-10-CM

## 2015-07-02 DIAGNOSIS — E781 Pure hyperglyceridemia: Secondary | ICD-10-CM

## 2015-07-02 DIAGNOSIS — M170 Bilateral primary osteoarthritis of knee: Secondary | ICD-10-CM | POA: Diagnosis not present

## 2015-07-02 DIAGNOSIS — Z1159 Encounter for screening for other viral diseases: Secondary | ICD-10-CM | POA: Diagnosis not present

## 2015-07-02 DIAGNOSIS — Z0000 Encounter for general adult medical examination without abnormal findings: Secondary | ICD-10-CM | POA: Diagnosis not present

## 2015-07-02 LAB — CBC WITH DIFFERENTIAL/PLATELET
Basophils Absolute: 0.1 10*3/uL (ref 0.0–0.1)
Basophils Relative: 1 % (ref 0–1)
EOS PCT: 5 % (ref 0–5)
Eosinophils Absolute: 0.3 10*3/uL (ref 0.0–0.7)
HEMATOCRIT: 40 % (ref 36.0–46.0)
Hemoglobin: 12.9 g/dL (ref 12.0–15.0)
LYMPHS PCT: 40 % (ref 12–46)
Lymphs Abs: 2.1 10*3/uL (ref 0.7–4.0)
MCH: 27.9 pg (ref 26.0–34.0)
MCHC: 32.3 g/dL (ref 30.0–36.0)
MCV: 86.6 fL (ref 78.0–100.0)
MONO ABS: 0.7 10*3/uL (ref 0.1–1.0)
MPV: 10.4 fL (ref 8.6–12.4)
Monocytes Relative: 13 % — ABNORMAL HIGH (ref 3–12)
Neutro Abs: 2.2 10*3/uL (ref 1.7–7.7)
Neutrophils Relative %: 41 % — ABNORMAL LOW (ref 43–77)
Platelets: 182 10*3/uL (ref 150–400)
RBC: 4.62 MIL/uL (ref 3.87–5.11)
RDW: 15.4 % (ref 11.5–15.5)
WBC: 5.3 10*3/uL (ref 4.0–10.5)

## 2015-07-02 LAB — LIPID PANEL
CHOL/HDL RATIO: 3.3 ratio (ref <=5.0)
Cholesterol: 193 mg/dL (ref 125–200)
HDL: 58 mg/dL (ref >=46)
LDL CALC: 101 mg/dL (ref <130)
Triglycerides: 172 mg/dL — ABNORMAL HIGH (ref <150)
VLDL: 34 mg/dL — ABNORMAL HIGH (ref <30)

## 2015-07-02 LAB — TSH: TSH: 1.824 u[IU]/mL (ref 0.350–4.500)

## 2015-07-02 LAB — COMPREHENSIVE METABOLIC PANEL
ALT: 13 U/L (ref 6–29)
AST: 29 U/L (ref 10–35)
Albumin: 3.5 g/dL — ABNORMAL LOW (ref 3.6–5.1)
Alkaline Phosphatase: 87 U/L (ref 33–130)
BILIRUBIN TOTAL: 0.5 mg/dL (ref 0.2–1.2)
BUN: 20 mg/dL (ref 7–25)
CALCIUM: 8.3 mg/dL — AB (ref 8.6–10.4)
CHLORIDE: 106 mmol/L (ref 98–110)
CO2: 22 mmol/L (ref 20–31)
Creat: 0.99 mg/dL — ABNORMAL HIGH (ref 0.60–0.93)
GLUCOSE: 78 mg/dL (ref 65–99)
POTASSIUM: 4.1 mmol/L (ref 3.5–5.3)
Sodium: 136 mmol/L (ref 135–146)
Total Protein: 5.9 g/dL — ABNORMAL LOW (ref 6.1–8.1)

## 2015-07-02 NOTE — Assessment & Plan Note (Signed)
Checking lipid panel today and hep C.

## 2015-07-02 NOTE — Assessment & Plan Note (Signed)
Likely multifactorial.  Perhaps worsened by thyroid function, awaiting results.   Also checking CMET. Lasix to treat.  Short-term course to have on hand.  Leg elevation and compression. Decrease salt intake.  Walk as much as tolerated with knee pain.

## 2015-07-02 NOTE — Assessment & Plan Note (Signed)
Refill for hypothyroid medications.  Checking TSH today.  Will adjust based on results.

## 2015-07-02 NOTE — Assessment & Plan Note (Signed)
BL arms and face. States she has had several of these removed by derm previously. Can FU with derm or return for cryotherapy of these.

## 2015-07-02 NOTE — Addendum Note (Signed)
Addended by: [name], Omere Marti D on: 07/02/2015 05:08 PM   Modules accepted: Orders

## 2015-07-02 NOTE — Progress Notes (Signed)
Subjective:    [name] is a 69 y.o. female who presents to [facility] today for hypothyroidism. She has been a long-term patient of [facility], but the majority of her medical conditions are managed by other physicians.  We generally just follow her for hypothyroidism.    1.  Hypothyroidism:  Generally has no symptoms.  Has had TSH here.  One episode of depressed TSH previously but generally she is controlled.  Gradual weight gain which has been worsening since her Right knee pain has increased.  She requires a knee replacement but is unsure about going through with this.    Declines other preventative health care measures discussed today.    ROS as above per HPI, otherwise neg.    The following portions of the patient's history were reviewed and updated as appropriate: allergies, current medications, past medical history, family and social history, and problem list. Patient is a nonsmoker.    PMH reviewed.  Past Medical History  Diagnosis Date  . Headache(784.0)   . IBS (irritable bowel syndrome)   . Hypothyroidism   . Mental disorder   . Anxiety     panic attacks   . Depression   . Seizures ([place])     caused by depression , seritonin seizure - effected short term memory    . Shortness of breath   . GERD (gastroesophageal reflux disease)     uses Protonix as needed   . Cancer ([place])     skin- preCA  . Arthritis     shoulders, knees  . Anemia     took Fe- orally 2 yrs. ago   . Humerus fracture     Right   . Preoperative examination 02/26/2013  . Hyposmolality and/or hyponatremia 02/27/2013   Past Surgical History  Procedure Laterality Date  . Stomach surgery      1/3 resection for obstruction  . Spine surgery  2005    Lumbar spinal stenosis   . Gastrectomy  1994    Due to ulcer, required multiple revisions.   . Orif ankle fracture Right 2004  . Back surgery  1990's  . Abdominal hysterectomy    . Tonsillectomy    . Appendectomy    . Cholecystectomy    . Hernia repair    .  Breast surgery  1972    augmentation   . Tubal ligation    . Reverse shoulder arthroplasty Right 05/16/2013    Procedure: RIGHT HEMI ARTHROPLASTY  VERSES  REVERSE TOTAL SHOULDER ARTHROPLASTY ;  Surgeon: Augustin Schooling, MD;  Location: [facility];  Service: Orthopedics;  Laterality: Right;    Medications reviewed. Current Outpatient Prescriptions  Medication Sig Dispense Refill  . aspirin 81 MG EC tablet Take 81 mg by mouth daily.      . Cholecalciferol (VITAMIN D3) 2000 UNIT capsule Take 2,000 Units by mouth at bedtime.      . clonazePAM (KLONOPIN) 1 MG tablet Take 1 mg by mouth 2 (two) times daily. Per Dr. Toy Care    . Coenzyme Q10 (CO Q-10) 300 MG CAPS Take 1 capsule by mouth daily.      . DULoxetine (CYMBALTA) 30 MG capsule Take 30-60 mg by mouth 2 (two) times daily. 60 mg in am and 30 mg in pm    . fluticasone (FLONASE) 50 MCG/ACT nasal spray Place 2 sprays into the nose daily. 16 g 5  . frovatriptan (FROVA) 2.5 MG tablet Take 2.5 mg by mouth as needed. If recurs, may repeat after 2  hours. Max of 3 tabs in 24 hours.  Per Dr. Thana Ates     . gabapentin (NEURONTIN) 100 MG capsule Take 1 capsule (100 mg total) by mouth 3 (three) times daily. 30 capsule 0  . levothyroxine (SYNTHROID, LEVOTHROID) 100 MCG tablet TAKE 1 TABLET BY MOUTH DAILY BEFORE BREAKFAST 30 tablet 0  . meloxicam (MOBIC) 7.5 MG tablet Take 1 tablet (7.5 mg total) by mouth daily. 15 tablet 2  . Multiple Vitamins-Minerals (CENTRUM SILVER PO) Take 1 tablet by mouth daily.     . Omega-3 Fatty Acids 500 MG CAPS Take 1 capsule by mouth daily.     . ondansetron (ZOFRAN) 4 MG tablet Take 4 mg by mouth every 8 (eight) hours as needed. for migraine per Dr. Collene Mares    . OVER THE COUNTER MEDICATION Take 1 tablet by mouth daily. Tumeric 490 mg BID    . oxyCODONE (OXYCONTIN) 20 MG 12 hr tablet Take 20 mg by mouth 4 (four) times daily. Per Dr. Thana Ates    . oxyCODONE (ROXICODONE) 15 MG immediate release tablet Take 15 mg by mouth every 4 (four) hours  as needed for pain.    . pregabalin (LYRICA) 100 MG capsule Take 100 mg by mouth 5 (five) times daily. Per Dr. Payton Spark    . QUEtiapine (SEROQUEL) 50 MG tablet Take 50 mg by mouth at bedtime.      . ramelteon (ROZEREM) 8 MG tablet Take 8 mg by mouth at bedtime. Per Dr. Toy Care     . S-Adenosylmethionine (SAM-E) 200 MG TABS Take 2 tablets by mouth daily. Per patient    . Tdap (BOOSTRIX) 5-2.5-18.5 LF-MCG/0.5 injection Inject 0.5 mLs into the muscle once. 0.5 mL 0  . tiZANidine (ZANAFLEX) 4 MG tablet Take 2 mg by mouth 2 (two) times daily. 1/2 tablet every morning, 1/2 tablet at supper per Dr. Thana Ates    . zoster vaccine live, PF, (ZOSTAVAX) 96295 UNT/0.65ML injection Inject 19,400 Units into the skin once. 1 each 0   No current facility-administered medications for this visit.     Objective:   Physical Exam BP 102/78 mmHg  Pulse 78  Temp(Src) 97.7 F (36.5 C) (Oral)  Ht 5\' 4"  (1.626 m)  Wt 231 lb 1.6 oz (104.826 kg)  BMI 39.65 kg/m2  SpO2 91% Gen:  Alert, cooperative patient who appears stated age in no acute distress.  Vital signs reviewed. HEENT: EOMI,  MMM Cardiac:  Regular rate and rhythm without murmur auscultated.  Good S1/S2. Pulm:  Clear to auscultation bilaterally with good air movement.  No wheezes or rales noted.   Exts: +3 edema noted BL ankles.  Psych:  Decreased affect, slow to respond.  However not depressed or anxious appearing.  SKin:  Multiple actinic keratoses present BL arms.  0.5 cm AK noted Right nostril, same size Left cheek.   No results found for this or any previous visit (from the past 72 hour(s)).

## 2015-07-03 LAB — HEPATITIS C ANTIBODY: HCV Ab: NEGATIVE

## 2015-07-05 ENCOUNTER — Encounter: Payer: Self-pay | Admitting: Family Medicine

## 2015-07-06 ENCOUNTER — Telehealth: Payer: Self-pay | Admitting: Family Medicine

## 2015-07-06 NOTE — Telephone Encounter (Signed)
Husband called because he said the letter that was sent out about her thyroid levels and the thyroid levels listed in My chart are very different. He also said that they have not received the refill of her thyroid medication or her water retention  medication that he was going to call in. He would also like to speak to the doctor. He will be home until 2:15 today since his wife has another doctors appointment at 3 pm. [name]

## 2015-07-07 MED ORDER — FUROSEMIDE 20 MG PO TABS: ORAL_TABLET | ORAL | Status: DC

## 2015-07-07 MED ORDER — LEVOTHYROXINE SODIUM 100 MCG PO TABS: ORAL_TABLET | ORAL | Status: DC

## 2015-07-07 NOTE — Telephone Encounter (Signed)
Had to leave message for husband to call back.

## 2015-07-09 NOTE — Telephone Encounter (Signed)
Called and had to leave another message

## 2015-07-09 NOTE — Telephone Encounter (Signed)
Husband called back and said that he is sorry to keep missing the doctor's call. He is needing a new prescription for a chair lift. He has one that was written in 2012 by Dr. Walker Kehr and was able to use it but for him to get back the tax involved in the lift and installation he will need a new prescription. He needs this in 6 days and we need to fax this to [phone]. jw

## 2015-07-09 NOTE — Telephone Encounter (Signed)
Husband is returning Dr. Cindra Presume call. jw

## 2015-07-16 NOTE — Telephone Encounter (Signed)
Placed in to be faxed box. 

## 2015-08-25 DIAGNOSIS — G43709 Chronic migraine without aura, not intractable, without status migrainosus: Secondary | ICD-10-CM | POA: Diagnosis not present

## 2015-09-15 DIAGNOSIS — G8929 Other chronic pain: Secondary | ICD-10-CM | POA: Diagnosis not present

## 2015-09-15 DIAGNOSIS — M5441 Lumbago with sciatica, right side: Secondary | ICD-10-CM | POA: Diagnosis not present

## 2015-09-15 DIAGNOSIS — G894 Chronic pain syndrome: Secondary | ICD-10-CM | POA: Diagnosis not present

## 2015-09-15 DIAGNOSIS — Z79891 Long term (current) use of opiate analgesic: Secondary | ICD-10-CM | POA: Diagnosis not present

## 2015-09-29 DIAGNOSIS — G8929 Other chronic pain: Secondary | ICD-10-CM | POA: Diagnosis not present

## 2015-09-29 DIAGNOSIS — M1711 Unilateral primary osteoarthritis, right knee: Secondary | ICD-10-CM | POA: Diagnosis not present

## 2015-09-29 DIAGNOSIS — Z01812 Encounter for preprocedural laboratory examination: Secondary | ICD-10-CM | POA: Diagnosis not present

## 2015-09-29 DIAGNOSIS — M25561 Pain in right knee: Secondary | ICD-10-CM | POA: Diagnosis not present

## 2015-09-29 DIAGNOSIS — R739 Hyperglycemia, unspecified: Secondary | ICD-10-CM | POA: Diagnosis not present

## 2015-10-06 DIAGNOSIS — M1711 Unilateral primary osteoarthritis, right knee: Secondary | ICD-10-CM | POA: Diagnosis not present

## 2015-10-12 ENCOUNTER — Ambulatory Visit (INDEPENDENT_AMBULATORY_CARE_PROVIDER_SITE_OTHER): Payer: Medicare Other | Admitting: Family Medicine

## 2015-10-12 ENCOUNTER — Encounter: Payer: Self-pay | Admitting: Family Medicine

## 2015-10-12 ENCOUNTER — Ambulatory Visit (HOSPITAL_COMMUNITY)
Admission: RE | Admit: 2015-10-12 | Discharge: 2015-10-12 | Disposition: A | Discharge status: Home / Self Care | Payer: Medicare Other | Source: Ambulatory Visit | Attending: Family Medicine | Admitting: Family Medicine

## 2015-10-12 VITALS — BP 100/49 | HR 112 | Temp 98.4°F | Ht 64.0 in | Wt 222.9 lb

## 2015-10-12 DIAGNOSIS — M25561 Pain in right knee: Secondary | ICD-10-CM

## 2015-10-12 DIAGNOSIS — R9431 Abnormal electrocardiogram [ECG] [EKG]: Secondary | ICD-10-CM | POA: Insufficient documentation

## 2015-10-12 DIAGNOSIS — Z0000 Encounter for general adult medical examination without abnormal findings: Secondary | ICD-10-CM

## 2015-10-12 NOTE — Progress Notes (Signed)
Subjective:    CORNELL BRUCKER is a 70 y.o. female who presents to [facility] today for surgical clearance for her upcoming knee surgery:  1.  Surgical clearance: SHANIAH GHOSH is a 70 y.o. female who presents to the office today for a preoperative consultation at the request of her surgeon for Right knee replacement. This consultation is requested for the specific conditions prompting preoperative evaluation (i.e. because of potential affect on operative risk): none. Planned anesthesia is general. The patient has the following known anesthesia issues: none . Patient has a bleeding risk of: no recent abnormal bleeding. Patient does not have objections to receiving blood products if needed.  Has had recent URI with mild cough for past 1.5 - 2 weeks.  Recovering from this and symptoms have almost completely abated.   ROS as above per HPI, otherwise neg.  Pertinently, no chest pain, palpitations, SOB, Fever, Chills, Abd pain, N/V/D.  No lightheaded/orthostatic symptoms.  No LE edema.     The following portions of the patient's history were reviewed and updated as appropriate: allergies, current medications, past medical history, family and social history, and problem list. Patient is a nonsmoker.   Predictors of intubation difficulty: Morbid obesity? yes  Anatomically abnormal facies? no Prominent incisors? no Receding mandible? no Short, thick neck? no Neck range of motion: normal Dentition: No chipped, loose, or missing teeth.  Cardiographics ECG: normal sinus rhythm, no blocks or conduction defects, no ischemic changes; flipped T waves present in Leads V1- V4 unchanged from prior EKG.     Medications reviewed. Current Outpatient Prescriptions  Medication Sig Dispense Refill  . aspirin 81 MG EC tablet Take 81 mg by mouth daily.      . Cholecalciferol (VITAMIN D3) 2000 UNIT capsule Take 2,000 Units by mouth at bedtime.      . clonazePAM (KLONOPIN) 1 MG tablet Take 1 mg by mouth 2 (two)  times daily. Per Dr. Toy Care    . Coenzyme Q10 (CO Q-10) 300 MG CAPS Take 1 capsule by mouth daily.      . DULoxetine (CYMBALTA) 30 MG capsule Take 30-60 mg by mouth 2 (two) times daily. 60 mg in am and 30 mg in pm    . fluticasone (FLONASE) 50 MCG/ACT nasal spray Place 2 sprays into the nose daily. 16 g 5  . frovatriptan (FROVA) 2.5 MG tablet Take 2.5 mg by mouth as needed. If recurs, may repeat after 2 hours. Max of 3 tabs in 24 hours.  Per Dr. Thana Ates     . furosemide (LASIX) 20 MG tablet Take 1 tab daily only when needed for lower extremity swelling or increase in weight 30 tablet 0  . gabapentin (NEURONTIN) 100 MG capsule Take 1 capsule (100 mg total) by mouth 3 (three) times daily. 30 capsule 0  . levothyroxine (SYNTHROID, LEVOTHROID) 100 MCG tablet TAKE 1 TABLET BY MOUTH DAILY BEFORE BREAKFAST 90 tablet 1  . meloxicam (MOBIC) 7.5 MG tablet Take 1 tablet (7.5 mg total) by mouth daily. 15 tablet 2  . Multiple Vitamins-Minerals (CENTRUM SILVER PO) Take 1 tablet by mouth daily.     . Omega-3 Fatty Acids 500 MG CAPS Take 1 capsule by mouth daily.     . ondansetron (ZOFRAN) 4 MG tablet Take 4 mg by mouth every 8 (eight) hours as needed. for migraine per Dr. Collene Mares    . OVER THE COUNTER MEDICATION Take 1 tablet by mouth daily. Tumeric 490 mg BID    . oxyCODONE (OXYCONTIN) 20 MG  12 hr tablet Take 20 mg by mouth 4 (four) times daily. Per Dr. Thana Ates    . oxyCODONE (ROXICODONE) 15 MG immediate release tablet Take 15 mg by mouth every 4 (four) hours as needed for pain.    . pregabalin (LYRICA) 100 MG capsule Take 100 mg by mouth 5 (five) times daily. Per Dr. Payton Spark    . QUEtiapine (SEROQUEL) 50 MG tablet Take 50 mg by mouth at bedtime.      . ramelteon (ROZEREM) 8 MG tablet Take 8 mg by mouth at bedtime. Per Dr. Toy Care     . S-Adenosylmethionine (SAM-E) 200 MG TABS Take 2 tablets by mouth daily. Per patient    . Tdap (BOOSTRIX) 5-2.5-18.5 LF-MCG/0.5 injection Inject 0.5 mLs into the muscle once. 0.5  mL 0  . tiZANidine (ZANAFLEX) 4 MG tablet Take 2 mg by mouth 2 (two) times daily. 1/2 tablet every morning, 1/2 tablet at supper per Dr. Thana Ates    . zoster vaccine live, PF, (ZOSTAVAX) 36644 UNT/0.65ML injection Inject 19,400 Units into the skin once. 1 each 0   No current facility-administered medications for this visit.     Objective:   Physical Exam BP 100/50 mmHg  Pulse 98  Temp(Src) 98.4 F (36.9 C) (Oral)  Ht 5\' 4"  (1.626 m)  Wt 222 lb 14.4 oz (101.107 kg)  BMI 38.24 kg/m2 Gen:  Alert, cooperative patient who appears stated age in no acute distress.  Vital signs reviewed.  Ambulates with walker secondary to knee pain.   HEENT: EOMI,  MMM Cardiac:  Regular rate and rhythm.  Pulm:  Clear to auscultation bilaterally.  Mild inspiratory wheezing noted Abd:  Soft/nondistended/nontender.  Good bowel sounds throughout all four quadrants.  No masses noted.  Exts: Non edematous BL  LE, warm and well perfused.   No results found for this or any previous visit (from the past 72 hour(s)).

## 2015-10-14 DIAGNOSIS — M25561 Pain in right knee: Secondary | ICD-10-CM | POA: Insufficient documentation

## 2015-10-14 NOTE — Assessment & Plan Note (Signed)
Low surgical risk based on ACS-National Surgical Quality Improvement Program risk prediction rule.    Initially hypotensive but spurious measurement -- repeated with manual BP revealed [id number] systolic, more in keeping with her baseline.  Had also taken narcotic about 2 hours prior to visit, states this drops her BP usually.  No lightheadedness/orthostatic symptoms.  Repeated EKG  As prior EKG showed flipped t waves.  No changes on this EKG.  She has had not evidence of cardiac pain/angina in past 6 months.   To stop ASA 5 days before surgery.  Will send paperwork to patient's surgeon.

## 2015-10-15 ENCOUNTER — Ambulatory Visit: Payer: Medicare Other | Admitting: Family Medicine

## 2015-10-19 ENCOUNTER — Telehealth: Payer: Self-pay | Admitting: Family Medicine

## 2015-10-19 NOTE — Telephone Encounter (Signed)
Pt husband is calling and states that a form to clear the pt for knee surgery was sent from Dr. Tawanna Sat office at [place].  Pt husband states that they have not received the completed form. Please let pt or pt husband know when to expect this form to be completed and sent.  Sadie Reynolds, ASA

## 2015-10-22 NOTE — Telephone Encounter (Signed)
RN staff - please call patient/husband and inform them that Dr. Mingo Amber is out of the office until next week, he will complete when he returns (he may require a follow up appointment to complete the form).

## 2015-10-22 NOTE — Telephone Encounter (Signed)
Informed pt husband of below and he would like to be notified when forms have been completed and sent. Forwarding to PCP as an [place]. [name], April D, [place]

## 2015-10-28 ENCOUNTER — Telehealth: Payer: Self-pay | Admitting: *Deleted

## 2015-10-28 NOTE — Telephone Encounter (Signed)
Form completed and placed in to be faxed box.

## 2015-10-28 NOTE — Telephone Encounter (Signed)
Please call husband to let him know forms have been completed.

## 2015-10-28 NOTE — Telephone Encounter (Signed)
Forms completed and faxed to [place] and sports medicine, pt husband informed of this. [name], April D, [place]

## 2015-10-28 NOTE — Telephone Encounter (Signed)
Form faxed and husband informed. [name], [name], [place]

## 2015-11-03 DIAGNOSIS — Z01812 Encounter for preprocedural laboratory examination: Secondary | ICD-10-CM | POA: Diagnosis not present

## 2015-11-03 DIAGNOSIS — Z791 Long term (current) use of non-steroidal anti-inflammatories (NSAID): Secondary | ICD-10-CM | POA: Diagnosis not present

## 2015-11-03 DIAGNOSIS — E039 Hypothyroidism, unspecified: Secondary | ICD-10-CM | POA: Diagnosis not present

## 2015-11-03 DIAGNOSIS — M1711 Unilateral primary osteoarthritis, right knee: Secondary | ICD-10-CM | POA: Diagnosis not present

## 2015-11-03 DIAGNOSIS — Z79899 Other long term (current) drug therapy: Secondary | ICD-10-CM | POA: Diagnosis not present

## 2015-11-03 DIAGNOSIS — F329 Major depressive disorder, single episode, unspecified: Secondary | ICD-10-CM | POA: Diagnosis not present

## 2015-11-09 ENCOUNTER — Encounter: Payer: Self-pay | Admitting: *Deleted

## 2015-11-11 ENCOUNTER — Encounter: Payer: Self-pay | Admitting: *Deleted

## 2015-11-16 DIAGNOSIS — N390 Urinary tract infection, site not specified: Secondary | ICD-10-CM | POA: Diagnosis not present

## 2015-11-22 DIAGNOSIS — Z6835 Body mass index (BMI) 35.0-35.9, adult: Secondary | ICD-10-CM | POA: Diagnosis not present

## 2015-11-22 DIAGNOSIS — Z888 Allergy status to other drugs, medicaments and biological substances status: Secondary | ICD-10-CM | POA: Diagnosis not present

## 2015-11-22 DIAGNOSIS — Z96611 Presence of right artificial shoulder joint: Secondary | ICD-10-CM | POA: Diagnosis present

## 2015-11-22 DIAGNOSIS — Z85828 Personal history of other malignant neoplasm of skin: Secondary | ICD-10-CM | POA: Diagnosis not present

## 2015-11-22 DIAGNOSIS — E669 Obesity, unspecified: Secondary | ICD-10-CM | POA: Diagnosis present

## 2015-11-22 DIAGNOSIS — G43909 Migraine, unspecified, not intractable, without status migrainosus: Secondary | ICD-10-CM | POA: Diagnosis present

## 2015-11-22 DIAGNOSIS — Z7982 Long term (current) use of aspirin: Secondary | ICD-10-CM | POA: Diagnosis not present

## 2015-11-22 DIAGNOSIS — M1711 Unilateral primary osteoarthritis, right knee: Secondary | ICD-10-CM | POA: Diagnosis not present

## 2015-11-22 DIAGNOSIS — Z79899 Other long term (current) drug therapy: Secondary | ICD-10-CM | POA: Diagnosis not present

## 2015-11-22 DIAGNOSIS — E039 Hypothyroidism, unspecified: Secondary | ICD-10-CM | POA: Diagnosis present

## 2015-11-22 DIAGNOSIS — F329 Major depressive disorder, single episode, unspecified: Secondary | ICD-10-CM | POA: Diagnosis present

## 2015-11-22 DIAGNOSIS — Z881 Allergy status to other antibiotic agents status: Secondary | ICD-10-CM | POA: Diagnosis not present

## 2015-11-27 DIAGNOSIS — J45909 Unspecified asthma, uncomplicated: Secondary | ICD-10-CM | POA: Diagnosis not present

## 2015-11-27 DIAGNOSIS — K589 Irritable bowel syndrome without diarrhea: Secondary | ICD-10-CM | POA: Diagnosis not present

## 2015-11-27 DIAGNOSIS — Z96651 Presence of right artificial knee joint: Secondary | ICD-10-CM | POA: Diagnosis not present

## 2015-11-27 DIAGNOSIS — M150 Primary generalized (osteo)arthritis: Secondary | ICD-10-CM | POA: Diagnosis not present

## 2015-11-27 DIAGNOSIS — Z471 Aftercare following joint replacement surgery: Secondary | ICD-10-CM | POA: Diagnosis not present

## 2015-11-30 DIAGNOSIS — Z471 Aftercare following joint replacement surgery: Secondary | ICD-10-CM | POA: Diagnosis not present

## 2015-11-30 DIAGNOSIS — J45909 Unspecified asthma, uncomplicated: Secondary | ICD-10-CM | POA: Diagnosis not present

## 2015-11-30 DIAGNOSIS — M150 Primary generalized (osteo)arthritis: Secondary | ICD-10-CM | POA: Diagnosis not present

## 2015-11-30 DIAGNOSIS — Z96651 Presence of right artificial knee joint: Secondary | ICD-10-CM | POA: Diagnosis not present

## 2015-11-30 DIAGNOSIS — K589 Irritable bowel syndrome without diarrhea: Secondary | ICD-10-CM | POA: Diagnosis not present

## 2015-12-03 ENCOUNTER — Telehealth: Payer: Self-pay | Admitting: *Deleted

## 2015-12-03 DIAGNOSIS — Z96651 Presence of right artificial knee joint: Secondary | ICD-10-CM | POA: Diagnosis not present

## 2015-12-03 DIAGNOSIS — M150 Primary generalized (osteo)arthritis: Secondary | ICD-10-CM | POA: Diagnosis not present

## 2015-12-03 DIAGNOSIS — K589 Irritable bowel syndrome without diarrhea: Secondary | ICD-10-CM | POA: Diagnosis not present

## 2015-12-03 DIAGNOSIS — J45909 Unspecified asthma, uncomplicated: Secondary | ICD-10-CM | POA: Diagnosis not present

## 2015-12-03 DIAGNOSIS — Z471 Aftercare following joint replacement surgery: Secondary | ICD-10-CM | POA: Diagnosis not present

## 2015-12-03 NOTE — Telephone Encounter (Signed)
Husband returned call. Reports patient is now feeling better and they think they know the cause of the dizziness and low blood pressures.  Patient recently had knee replacement. Someone brought over food for them and they think it contained dairy. 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 This morning felt dizzy and they think she was dehydrated. Symptoms have resolved with oral hydration. Most recent BP was 100/74, consistent with BP at last office visit.  Recommended calling back if symptoms return. Husband appreciative.  Leeanne Rio, MD

## 2015-12-03 NOTE — Telephone Encounter (Signed)
[name], Physical Therapist with [facility] called stating she was out doing home visit with patient s/p right knee surgery.  Patient's BP 70/50 with complaints of dizziness.  Tried to have patient drink more water, increased to 75/50. However patient is still complaining of dizziness.  Dizziness started this morning.  Offered an same day appointment, but declined.  Patient's husband is not able to get patient out of the house. Please advise.  Please give patient and husband a call at [phone].  Derl Barrow, RN

## 2015-12-03 NOTE — Telephone Encounter (Signed)
I am covering for Dr. Mingo Amber who is away from the office.  Attempted x2 to reach patient/husband, but no answer. Was not able to leave VM. If they return call I will be happy to speak with them. I do recommend she be evaluated in person if she is dizzy and her blood pressure is persistently that low.  Leeanne Rio, MD

## 2015-12-06 DIAGNOSIS — Z96651 Presence of right artificial knee joint: Secondary | ICD-10-CM | POA: Diagnosis not present

## 2015-12-06 DIAGNOSIS — Z471 Aftercare following joint replacement surgery: Secondary | ICD-10-CM | POA: Diagnosis not present

## 2015-12-06 DIAGNOSIS — J45909 Unspecified asthma, uncomplicated: Secondary | ICD-10-CM | POA: Diagnosis not present

## 2015-12-06 DIAGNOSIS — K589 Irritable bowel syndrome without diarrhea: Secondary | ICD-10-CM | POA: Diagnosis not present

## 2015-12-06 DIAGNOSIS — M150 Primary generalized (osteo)arthritis: Secondary | ICD-10-CM | POA: Diagnosis not present

## 2015-12-07 DIAGNOSIS — K589 Irritable bowel syndrome without diarrhea: Secondary | ICD-10-CM | POA: Diagnosis not present

## 2015-12-07 DIAGNOSIS — M150 Primary generalized (osteo)arthritis: Secondary | ICD-10-CM | POA: Diagnosis not present

## 2015-12-07 DIAGNOSIS — Z96651 Presence of right artificial knee joint: Secondary | ICD-10-CM | POA: Diagnosis not present

## 2015-12-07 DIAGNOSIS — J45909 Unspecified asthma, uncomplicated: Secondary | ICD-10-CM | POA: Diagnosis not present

## 2015-12-07 DIAGNOSIS — Z471 Aftercare following joint replacement surgery: Secondary | ICD-10-CM | POA: Diagnosis not present

## 2015-12-08 DIAGNOSIS — Z96651 Presence of right artificial knee joint: Secondary | ICD-10-CM | POA: Diagnosis not present

## 2015-12-08 DIAGNOSIS — J45909 Unspecified asthma, uncomplicated: Secondary | ICD-10-CM | POA: Diagnosis not present

## 2015-12-08 DIAGNOSIS — K589 Irritable bowel syndrome without diarrhea: Secondary | ICD-10-CM | POA: Diagnosis not present

## 2015-12-08 DIAGNOSIS — M150 Primary generalized (osteo)arthritis: Secondary | ICD-10-CM | POA: Diagnosis not present

## 2015-12-08 DIAGNOSIS — Z471 Aftercare following joint replacement surgery: Secondary | ICD-10-CM | POA: Diagnosis not present

## 2015-12-09 DIAGNOSIS — K589 Irritable bowel syndrome without diarrhea: Secondary | ICD-10-CM | POA: Diagnosis not present

## 2015-12-09 DIAGNOSIS — Z96651 Presence of right artificial knee joint: Secondary | ICD-10-CM | POA: Diagnosis not present

## 2015-12-09 DIAGNOSIS — J45909 Unspecified asthma, uncomplicated: Secondary | ICD-10-CM | POA: Diagnosis not present

## 2015-12-09 DIAGNOSIS — M150 Primary generalized (osteo)arthritis: Secondary | ICD-10-CM | POA: Diagnosis not present

## 2015-12-09 DIAGNOSIS — Z471 Aftercare following joint replacement surgery: Secondary | ICD-10-CM | POA: Diagnosis not present

## 2015-12-10 DIAGNOSIS — Z96651 Presence of right artificial knee joint: Secondary | ICD-10-CM | POA: Diagnosis not present

## 2015-12-10 DIAGNOSIS — K589 Irritable bowel syndrome without diarrhea: Secondary | ICD-10-CM | POA: Diagnosis not present

## 2015-12-10 DIAGNOSIS — J45909 Unspecified asthma, uncomplicated: Secondary | ICD-10-CM | POA: Diagnosis not present

## 2015-12-10 DIAGNOSIS — M150 Primary generalized (osteo)arthritis: Secondary | ICD-10-CM | POA: Diagnosis not present

## 2015-12-10 DIAGNOSIS — Z471 Aftercare following joint replacement surgery: Secondary | ICD-10-CM | POA: Diagnosis not present

## 2015-12-13 DIAGNOSIS — J45909 Unspecified asthma, uncomplicated: Secondary | ICD-10-CM | POA: Diagnosis not present

## 2015-12-13 DIAGNOSIS — Z96651 Presence of right artificial knee joint: Secondary | ICD-10-CM | POA: Diagnosis not present

## 2015-12-13 DIAGNOSIS — M150 Primary generalized (osteo)arthritis: Secondary | ICD-10-CM | POA: Diagnosis not present

## 2015-12-13 DIAGNOSIS — Z471 Aftercare following joint replacement surgery: Secondary | ICD-10-CM | POA: Diagnosis not present

## 2015-12-13 DIAGNOSIS — K589 Irritable bowel syndrome without diarrhea: Secondary | ICD-10-CM | POA: Diagnosis not present

## 2015-12-15 DIAGNOSIS — Z96651 Presence of right artificial knee joint: Secondary | ICD-10-CM | POA: Diagnosis not present

## 2015-12-15 DIAGNOSIS — Z471 Aftercare following joint replacement surgery: Secondary | ICD-10-CM | POA: Diagnosis not present

## 2015-12-15 DIAGNOSIS — J45909 Unspecified asthma, uncomplicated: Secondary | ICD-10-CM | POA: Diagnosis not present

## 2015-12-15 DIAGNOSIS — K589 Irritable bowel syndrome without diarrhea: Secondary | ICD-10-CM | POA: Diagnosis not present

## 2015-12-15 DIAGNOSIS — M150 Primary generalized (osteo)arthritis: Secondary | ICD-10-CM | POA: Diagnosis not present

## 2015-12-16 DIAGNOSIS — Z471 Aftercare following joint replacement surgery: Secondary | ICD-10-CM | POA: Diagnosis not present

## 2015-12-16 DIAGNOSIS — M150 Primary generalized (osteo)arthritis: Secondary | ICD-10-CM | POA: Diagnosis not present

## 2015-12-16 DIAGNOSIS — K589 Irritable bowel syndrome without diarrhea: Secondary | ICD-10-CM | POA: Diagnosis not present

## 2015-12-16 DIAGNOSIS — Z96651 Presence of right artificial knee joint: Secondary | ICD-10-CM | POA: Diagnosis not present

## 2015-12-16 DIAGNOSIS — J45909 Unspecified asthma, uncomplicated: Secondary | ICD-10-CM | POA: Diagnosis not present

## 2015-12-17 DIAGNOSIS — K589 Irritable bowel syndrome without diarrhea: Secondary | ICD-10-CM | POA: Diagnosis not present

## 2015-12-17 DIAGNOSIS — Z471 Aftercare following joint replacement surgery: Secondary | ICD-10-CM | POA: Diagnosis not present

## 2015-12-17 DIAGNOSIS — J45909 Unspecified asthma, uncomplicated: Secondary | ICD-10-CM | POA: Diagnosis not present

## 2015-12-17 DIAGNOSIS — M150 Primary generalized (osteo)arthritis: Secondary | ICD-10-CM | POA: Diagnosis not present

## 2015-12-17 DIAGNOSIS — Z96651 Presence of right artificial knee joint: Secondary | ICD-10-CM | POA: Diagnosis not present

## 2015-12-21 DIAGNOSIS — K589 Irritable bowel syndrome without diarrhea: Secondary | ICD-10-CM | POA: Diagnosis not present

## 2015-12-21 DIAGNOSIS — Z96651 Presence of right artificial knee joint: Secondary | ICD-10-CM | POA: Diagnosis not present

## 2015-12-21 DIAGNOSIS — M150 Primary generalized (osteo)arthritis: Secondary | ICD-10-CM | POA: Diagnosis not present

## 2015-12-21 DIAGNOSIS — J45909 Unspecified asthma, uncomplicated: Secondary | ICD-10-CM | POA: Diagnosis not present

## 2015-12-21 DIAGNOSIS — Z471 Aftercare following joint replacement surgery: Secondary | ICD-10-CM | POA: Diagnosis not present

## 2015-12-27 DIAGNOSIS — Z09 Encounter for follow-up examination after completed treatment for conditions other than malignant neoplasm: Secondary | ICD-10-CM | POA: Diagnosis not present

## 2015-12-28 DIAGNOSIS — Z471 Aftercare following joint replacement surgery: Secondary | ICD-10-CM | POA: Diagnosis not present

## 2015-12-28 DIAGNOSIS — J45909 Unspecified asthma, uncomplicated: Secondary | ICD-10-CM | POA: Diagnosis not present

## 2015-12-28 DIAGNOSIS — Z96651 Presence of right artificial knee joint: Secondary | ICD-10-CM | POA: Diagnosis not present

## 2015-12-28 DIAGNOSIS — M150 Primary generalized (osteo)arthritis: Secondary | ICD-10-CM | POA: Diagnosis not present

## 2015-12-28 DIAGNOSIS — K589 Irritable bowel syndrome without diarrhea: Secondary | ICD-10-CM | POA: Diagnosis not present

## 2015-12-29 DIAGNOSIS — K589 Irritable bowel syndrome without diarrhea: Secondary | ICD-10-CM | POA: Diagnosis not present

## 2015-12-29 DIAGNOSIS — Z471 Aftercare following joint replacement surgery: Secondary | ICD-10-CM | POA: Diagnosis not present

## 2015-12-29 DIAGNOSIS — M150 Primary generalized (osteo)arthritis: Secondary | ICD-10-CM | POA: Diagnosis not present

## 2015-12-29 DIAGNOSIS — Z96651 Presence of right artificial knee joint: Secondary | ICD-10-CM | POA: Diagnosis not present

## 2015-12-29 DIAGNOSIS — J45909 Unspecified asthma, uncomplicated: Secondary | ICD-10-CM | POA: Diagnosis not present

## 2015-12-31 DIAGNOSIS — M150 Primary generalized (osteo)arthritis: Secondary | ICD-10-CM | POA: Diagnosis not present

## 2015-12-31 DIAGNOSIS — K589 Irritable bowel syndrome without diarrhea: Secondary | ICD-10-CM | POA: Diagnosis not present

## 2015-12-31 DIAGNOSIS — Z471 Aftercare following joint replacement surgery: Secondary | ICD-10-CM | POA: Diagnosis not present

## 2015-12-31 DIAGNOSIS — J45909 Unspecified asthma, uncomplicated: Secondary | ICD-10-CM | POA: Diagnosis not present

## 2015-12-31 DIAGNOSIS — Z96651 Presence of right artificial knee joint: Secondary | ICD-10-CM | POA: Diagnosis not present

## 2016-01-03 ENCOUNTER — Other Ambulatory Visit: Payer: Self-pay | Admitting: Family Medicine

## 2016-01-07 DIAGNOSIS — Z96651 Presence of right artificial knee joint: Secondary | ICD-10-CM | POA: Diagnosis not present

## 2016-01-13 ENCOUNTER — Telehealth: Payer: Self-pay | Admitting: Family Medicine

## 2016-01-13 NOTE — Telephone Encounter (Signed)
Patient's husband calls, patient is for sure that she has a UTI. Would like antibiotic called in. Please advise.

## 2016-01-14 MED ORDER — CEPHALEXIN 500 MG PO CAPS: 500.0000 mg | ORAL_CAPSULE | Freq: Three times a day (TID) | ORAL | Status: DC

## 2016-01-14 NOTE — Telephone Encounter (Signed)
Called and spoke with husband.  Patient has symptoms indicative of UTI.  Plan to treat with Keflex.  FU if no improvement by next week.  No fevers or systemic symptoms indicative of pyelonephritis.

## 2016-01-17 DIAGNOSIS — Z96651 Presence of right artificial knee joint: Secondary | ICD-10-CM | POA: Diagnosis not present

## 2016-01-19 DIAGNOSIS — Z96651 Presence of right artificial knee joint: Secondary | ICD-10-CM | POA: Diagnosis not present

## 2016-02-17 DIAGNOSIS — Z96651 Presence of right artificial knee joint: Secondary | ICD-10-CM | POA: Diagnosis not present

## 2016-02-17 DIAGNOSIS — M1712 Unilateral primary osteoarthritis, left knee: Secondary | ICD-10-CM | POA: Diagnosis not present

## 2016-02-28 ENCOUNTER — Encounter: Payer: Self-pay | Admitting: Family Medicine

## 2016-02-28 ENCOUNTER — Ambulatory Visit (INDEPENDENT_AMBULATORY_CARE_PROVIDER_SITE_OTHER): Payer: Medicare Other | Admitting: Family Medicine

## 2016-02-28 VITALS — BP 110/68 | HR 77 | Temp 97.9°F | Wt 219.4 lb

## 2016-02-28 DIAGNOSIS — M546 Pain in thoracic spine: Secondary | ICD-10-CM | POA: Diagnosis not present

## 2016-02-28 DIAGNOSIS — R0789 Other chest pain: Secondary | ICD-10-CM | POA: Diagnosis not present

## 2016-02-28 DIAGNOSIS — W19XXXA Unspecified fall, initial encounter: Secondary | ICD-10-CM | POA: Diagnosis not present

## 2016-02-28 DIAGNOSIS — R0781 Pleurodynia: Secondary | ICD-10-CM | POA: Diagnosis not present

## 2016-02-28 MED ORDER — OXYCODONE HCL 15 MG PO TABS: 15.0000 mg | ORAL_TABLET | ORAL | Status: DC | PRN

## 2016-02-28 NOTE — Progress Notes (Signed)
Subjective:    Patient ID: [name], female    DOB: June 20, 1946, 70 y.o.   MRN: [id number]  [name] is a 70 y.o. female presenting on 02/28/2016 for Fall   Patient presents for a same day appointment.    HPI  FALL, TRAUMATIC / LEFT RIB-CHEST WALL PAIN: - Chronic known history of low back pain, DJD/OA, osteopenia, prior back surgery with T12 vertebroplasty, including prior rib fracture various ribs on L-side after similar traumatic fall 2 years ago - Presents today with husband, they report fall at home with traumatic injury 2 days ago, described fall after patient was coming out of bathroom using walker, stated that the "walker got tangled" and she tripped forward, hit her left chest wall / ribcage on walker and ground also tangled her right arm/shoulder in walker, husband in living room able to help her up. Describes acute onset severe pain Left chest wall / ribs after trauma, persistent gradual worsening aching/throbbing pain, up to 10/10, worse with deep breath or any movement, improved with ice pack, not tried heat - Tried Oxycodone IR 15mg  PRN with good relief, only few pills left, this was prescribed previously by Dr Nelva Bush (Spine Surgery). Not taking NSAIDs (history of PUD, since resolved, has occasionally taken short courses meloxicam) - Next apt scheduled with Dr Nelva Bush (Spine, Ortho) within 2 weeks - Denies any LOC, head injury, syncope, CP, SOB  Social History  Substance Use Topics  . Smoking status: Never Smoker   . Smokeless tobacco: None  . Alcohol Use: Yes     Comment: rare use of white wine     Review of Systems Per HPI unless specifically indicated above     Objective:    BP 110/68 mmHg  Pulse 77  Temp(Src) 97.9 F (36.6 C) (Oral)  Wt 219 lb 6.4 oz (99.519 kg)  SpO2 93%  Wt Readings from Last 3 Encounters:  02/28/16 219 lb 6.4 oz (99.519 kg)  10/12/15 222 lb 14.4 oz (101.107 kg)  07/02/15 231 lb 1.6 oz (104.826 kg)    Physical Exam    Constitutional: She appears well-developed and well-nourished. No distress.  Chronically ill appearing, obese, moderate discomfort left ribcage pain holding ice pack, has walker  HENT:  Head: Normocephalic and atraumatic.  Mouth/Throat: Oropharynx is clear and moist.  Neck: Normal range of motion. Neck supple.  Cardiovascular: Normal rate, regular rhythm, normal heart sounds and intact distal pulses.   No murmur heard. Pulmonary/Chest: Breath sounds normal. No respiratory distress. She exhibits tenderness (Left mid lateral chest wall over ribs).  Limited resp effort with deep inspirations due to rib pain, but overall good air movement. Appears comfortable resp status  Abdominal: Soft. Bowel sounds are normal. She exhibits no distension.  Musculoskeletal: She exhibits no edema.  Low Back Inspection: Normal appearance, Large body habitus, no spinal deformity, symmetrical. Palpation: Mild tenderness over spinous processes lower T spine. Bilateral low back paraspinal muscles with some hypertonicity and tenderness. ROM: Limited active ROM due to acute pain Special Testing: Seated SLR negative for radicular pain bilaterally Strength: Bilateral hip flex/ext 5/5, knee flex/ext 5/5, ankle dorsiflex/plantarflex 5/5 Neurovascular: intact distal sensation to light touch  Skin: Skin is warm and dry. She is not diaphoretic.  Right posterior upper arm with 3 x 6 cm ecchymosis  Left lateral ribcage with 10 x 5 round ecchymosis  Nursing note and vitals reviewed.      Assessment & Plan:   Problem List Items Addressed This Visit  Chest wall tenderness - Primary    Left lateral rib pain with ecchymosis secondary to traumatic fall, concern possible rib fracture Refill chronic oxycodone PRN pain, X-rays, follow-up      Relevant Medications   oxyCODONE (ROXICODONE) 15 MG immediate release tablet   Other Relevant Orders   DG Ribs Unilateral Left    Other Visit Diagnoses    Rib pain on left side         Relevant Medications    oxyCODONE (ROXICODONE) 15 MG immediate release tablet    Other Relevant Orders    DG Ribs Unilateral Left    Fall, initial encounter        Acute midline thoracic back pain        Relevant Medications    oxyCODONE (ROXICODONE) 15 MG immediate release tablet    Other Relevant Orders    DG Thoracic Spine 2 View       Concern for possible left lateral rib fracture following traumatic fall with significant pain and ecchymosis, consistent with MSK/rib injury, prior history rib fractures similar area from fall, osteopenia inc risk for fracture. Also complicated with complex spine history OA/DJD spinal stenosis and surgery, followed by Dr Gloris [facility]. Considered other etiology of fall however history does not seem supportive, also alrdy on Vitamin D for elderly fall risk reduction. - Refill Oxycodone 15mg  IR q 4 hr PRN #40 tabs for 2 week supply, then follow-up with Dr Nelva Bush for other treatment - Ordered Left unilateral rib films, and thoracic spine image to rule out fractures given location of pain - Ice/heat, rest, conservative management - Follow-up if not improving   Meds ordered this encounter  Medications  . oxyCODONE (ROXICODONE) 15 MG immediate release tablet    Sig: Take 1 tablet (15 mg total) by mouth every 4 (four) hours as needed for pain.    Dispense:  40 tablet    Refill:  0      Follow up plan: Return in about 2 weeks (around 03/13/2016), or if symptoms worsen or fail to improve, for fall, rib pain / back pain.  Nobie Putnam, [place], PGY-3

## 2016-02-28 NOTE — Patient Instructions (Signed)
Thank you for coming in to clinic today.  1. Take Oxycodone as needed for pain 2 to 3 pills a day over next 2 weeks until you see Dr Nelva Bush 2. Ordered X-rays at [place], walk in. I will call you with results in next few days 3. Use ice packs and sometimes.  Please schedule a follow-up appointment with Dr Mingo Amber as needed within next 2-4 weeks if persistent pain or new symptoms return sooner.  If you have any other questions or concerns, please feel free to call the clinic to contact me. You may also schedule an earlier appointment if necessary.  However, if your symptoms get significantly worse, please go to the Emergency Department to seek immediate medical attention.  Nobie Putnam, [place]

## 2016-02-29 NOTE — Assessment & Plan Note (Signed)
Left lateral rib pain with ecchymosis secondary to traumatic fall, concern possible rib fracture Refill chronic oxycodone PRN pain, X-rays, follow-up

## 2016-03-02 ENCOUNTER — Other Ambulatory Visit: Payer: Self-pay | Admitting: Family Medicine

## 2016-03-02 ENCOUNTER — Ambulatory Visit
Admission: RE | Admit: 2016-03-02 | Discharge: 2016-03-02 | Disposition: A | Discharge status: Home / Self Care | Payer: Medicare Other | Source: Ambulatory Visit | Attending: Family Medicine | Admitting: Family Medicine

## 2016-03-02 DIAGNOSIS — R0781 Pleurodynia: Secondary | ICD-10-CM

## 2016-03-02 DIAGNOSIS — M546 Pain in thoracic spine: Secondary | ICD-10-CM

## 2016-03-02 DIAGNOSIS — R0789 Other chest pain: Secondary | ICD-10-CM

## 2016-03-02 DIAGNOSIS — S299XXA Unspecified injury of thorax, initial encounter: Secondary | ICD-10-CM | POA: Diagnosis not present

## 2016-03-03 ENCOUNTER — Telehealth: Payer: Self-pay | Admitting: Family Medicine

## 2016-03-03 NOTE — Telephone Encounter (Signed)
Last seen on 02/28/16 for traumatic fall injury Left sided ribs / back. Obtained Thoracic X-ray and Left rib X-ray. See results below, essentially no new fractures identified. Given ecchymosis over ribs on exam, suspect likely rib / muscle contusion, causing significant pain. Discussed these results with patient / family, and given reassurance to continue with prior recommendations, pain control and activity modification. Reassurance no fracture. Advised follow-up as planned within 3-6 weeks.  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 No acute fracture. No spondylolisthesis. Disc space narrowing at multiple levels.  6/29 Left rib x-ray IMPRESSION: Old left posterior rib fractures. No new left rib fracture.  Nobie Putnam, [place], PGY-3

## 2016-03-09 DIAGNOSIS — M5441 Lumbago with sciatica, right side: Secondary | ICD-10-CM | POA: Diagnosis not present

## 2016-03-09 DIAGNOSIS — G894 Chronic pain syndrome: Secondary | ICD-10-CM | POA: Diagnosis not present

## 2016-03-09 DIAGNOSIS — Z79891 Long term (current) use of opiate analgesic: Secondary | ICD-10-CM | POA: Diagnosis not present

## 2016-03-09 DIAGNOSIS — G8929 Other chronic pain: Secondary | ICD-10-CM | POA: Diagnosis not present

## 2016-03-22 DIAGNOSIS — Z96651 Presence of right artificial knee joint: Secondary | ICD-10-CM | POA: Diagnosis not present

## 2016-03-22 DIAGNOSIS — M1712 Unilateral primary osteoarthritis, left knee: Secondary | ICD-10-CM | POA: Diagnosis not present

## 2016-03-24 DIAGNOSIS — Z9181 History of falling: Secondary | ICD-10-CM | POA: Diagnosis not present

## 2016-03-24 DIAGNOSIS — R2689 Other abnormalities of gait and mobility: Secondary | ICD-10-CM | POA: Diagnosis not present

## 2016-03-24 DIAGNOSIS — K589 Irritable bowel syndrome without diarrhea: Secondary | ICD-10-CM | POA: Diagnosis not present

## 2016-03-24 DIAGNOSIS — Z96651 Presence of right artificial knee joint: Secondary | ICD-10-CM | POA: Diagnosis not present

## 2016-03-24 DIAGNOSIS — R296 Repeated falls: Secondary | ICD-10-CM | POA: Diagnosis not present

## 2016-03-24 DIAGNOSIS — Z471 Aftercare following joint replacement surgery: Secondary | ICD-10-CM | POA: Diagnosis not present

## 2016-03-24 DIAGNOSIS — J45909 Unspecified asthma, uncomplicated: Secondary | ICD-10-CM | POA: Diagnosis not present

## 2016-03-27 DIAGNOSIS — K589 Irritable bowel syndrome without diarrhea: Secondary | ICD-10-CM | POA: Diagnosis not present

## 2016-03-27 DIAGNOSIS — R296 Repeated falls: Secondary | ICD-10-CM | POA: Diagnosis not present

## 2016-03-27 DIAGNOSIS — R2689 Other abnormalities of gait and mobility: Secondary | ICD-10-CM | POA: Diagnosis not present

## 2016-03-27 DIAGNOSIS — J45909 Unspecified asthma, uncomplicated: Secondary | ICD-10-CM | POA: Diagnosis not present

## 2016-03-27 DIAGNOSIS — Z471 Aftercare following joint replacement surgery: Secondary | ICD-10-CM | POA: Diagnosis not present

## 2016-03-27 DIAGNOSIS — Z96651 Presence of right artificial knee joint: Secondary | ICD-10-CM | POA: Diagnosis not present

## 2016-03-31 DIAGNOSIS — Z96651 Presence of right artificial knee joint: Secondary | ICD-10-CM | POA: Diagnosis not present

## 2016-03-31 DIAGNOSIS — K589 Irritable bowel syndrome without diarrhea: Secondary | ICD-10-CM | POA: Diagnosis not present

## 2016-03-31 DIAGNOSIS — Z471 Aftercare following joint replacement surgery: Secondary | ICD-10-CM | POA: Diagnosis not present

## 2016-03-31 DIAGNOSIS — J45909 Unspecified asthma, uncomplicated: Secondary | ICD-10-CM | POA: Diagnosis not present

## 2016-03-31 DIAGNOSIS — R2689 Other abnormalities of gait and mobility: Secondary | ICD-10-CM | POA: Diagnosis not present

## 2016-03-31 DIAGNOSIS — R296 Repeated falls: Secondary | ICD-10-CM | POA: Diagnosis not present

## 2016-04-03 ENCOUNTER — Encounter (HOSPITAL_COMMUNITY): Payer: Self-pay | Admitting: Emergency Medicine

## 2016-04-03 ENCOUNTER — Emergency Department (HOSPITAL_COMMUNITY)
Admission: EM | Admit: 2016-04-03 | Discharge: 2016-04-03 | Disposition: A | Discharge status: Home / Self Care | Payer: Medicare Other | Attending: Emergency Medicine | Admitting: Emergency Medicine

## 2016-04-03 DIAGNOSIS — E039 Hypothyroidism, unspecified: Secondary | ICD-10-CM | POA: Diagnosis not present

## 2016-04-03 DIAGNOSIS — Z96611 Presence of right artificial shoulder joint: Secondary | ICD-10-CM | POA: Diagnosis not present

## 2016-04-03 DIAGNOSIS — G43909 Migraine, unspecified, not intractable, without status migrainosus: Secondary | ICD-10-CM | POA: Diagnosis not present

## 2016-04-03 DIAGNOSIS — Z7982 Long term (current) use of aspirin: Secondary | ICD-10-CM | POA: Diagnosis not present

## 2016-04-03 DIAGNOSIS — Z96651 Presence of right artificial knee joint: Secondary | ICD-10-CM | POA: Insufficient documentation

## 2016-04-03 DIAGNOSIS — I5030 Unspecified diastolic (congestive) heart failure: Secondary | ICD-10-CM | POA: Diagnosis not present

## 2016-04-03 DIAGNOSIS — G43009 Migraine without aura, not intractable, without status migrainosus: Secondary | ICD-10-CM

## 2016-04-03 DIAGNOSIS — Z85828 Personal history of other malignant neoplasm of skin: Secondary | ICD-10-CM | POA: Diagnosis not present

## 2016-04-03 MED ORDER — PROCHLORPERAZINE EDISYLATE 5 MG/ML IJ SOLN
10.0000 mg | Freq: Once | INTRAMUSCULAR | Status: AC
  Administered 2016-04-03: 10 mg via INTRAVENOUS
  Filled 2016-04-03: qty 2

## 2016-04-03 MED ORDER — MAGNESIUM SULFATE 2 GM/50ML IV SOLN
2.0000 g | Freq: Once | INTRAVENOUS | Status: AC
  Administered 2016-04-03: 2 g via INTRAVENOUS
  Filled 2016-04-03: qty 50

## 2016-04-03 MED ORDER — ONDANSETRON 4 MG PO TBDP
ORAL_TABLET | ORAL | Status: AC
  Filled 2016-04-03: qty 1

## 2016-04-03 MED ORDER — SODIUM CHLORIDE 0.9 % IV BOLUS (SEPSIS)
1000.0000 mL | Freq: Once | INTRAVENOUS | Status: AC
  Administered 2016-04-03: 1000 mL via INTRAVENOUS

## 2016-04-03 MED ORDER — VALPROATE SODIUM 500 MG/5ML IV SOLN
500.0000 mg | Freq: Once | INTRAVENOUS | Status: AC
  Administered 2016-04-03: 500 mg via INTRAVENOUS
  Filled 2016-04-03: qty 5

## 2016-04-03 MED ORDER — DIPHENHYDRAMINE HCL 50 MG/ML IJ SOLN
25.0000 mg | Freq: Once | INTRAMUSCULAR | Status: AC
  Administered 2016-04-03: 25 mg via INTRAVENOUS
  Filled 2016-04-03: qty 1

## 2016-04-03 MED ORDER — ONDANSETRON 4 MG PO TBDP
4.0000 mg | ORAL_TABLET | Freq: Once | ORAL | Status: AC
  Administered 2016-04-03: 4 mg via ORAL

## 2016-04-03 MED ORDER — DEXAMETHASONE SODIUM PHOSPHATE 10 MG/ML IJ SOLN
10.0000 mg | Freq: Once | INTRAMUSCULAR | Status: AC
  Administered 2016-04-03: 10 mg via INTRAVENOUS
  Filled 2016-04-03: qty 1

## 2016-04-03 MED ORDER — HYDROMORPHONE HCL 1 MG/ML IJ SOLN
1.0000 mg | Freq: Once | INTRAMUSCULAR | Status: AC
  Administered 2016-04-03: 1 mg via INTRAVENOUS
  Filled 2016-04-03: qty 1

## 2016-04-03 MED ORDER — VALPROATE SODIUM 500 MG/5ML IV SOLN: 500.0000 mg | Freq: Once | INTRAVENOUS | Status: DC

## 2016-04-03 MED ORDER — KETOROLAC TROMETHAMINE 15 MG/ML IJ SOLN
15.0000 mg | Freq: Once | INTRAMUSCULAR | Status: AC
  Administered 2016-04-03: 15 mg via INTRAVENOUS
  Filled 2016-04-03: qty 1

## 2016-04-03 NOTE — ED Provider Notes (Signed)
[facility] DEPT Provider Note   CSN: [id number] Arrival date & time: 04/03/16  1417  First Provider Contact:  First MD Initiated Contact with Patient 04/03/16 1746     History   Chief Complaint Chief Complaint  Patient presents with  . Migraine    HPI [name] is a 70 y.o. female.  The history is provided by the patient and the spouse. No language interpreter was used.  Migraine  This is a new problem. The current episode started in the past 7 days. The problem occurs constantly. The problem has been gradually worsening. Associated symptoms include headaches and nausea. Pertinent negatives include no abdominal pain, anorexia, arthralgias, change in bowel habit, chest pain, chills, coughing, fatigue, fever, numbness, rash, sore throat, swollen glands, urinary symptoms, vertigo, visual change, vomiting or weakness. Nothing aggravates the symptoms. Treatments tried: Oxycodone. The treatment provided no relief.    Past Medical History:  Diagnosis Date  . Anemia    took Fe- orally 2 yrs. ago   . Anxiety    panic attacks   . Arthritis    shoulders, knees  . Cancer ([place])    skin- preCA  . Depression   . GERD (gastroesophageal reflux disease)    uses Protonix as needed   . Headache(784.0)   . Humerus fracture    Right   . Hyposmolality and/or hyponatremia 02/27/2013  . Hypothyroidism   . IBS (irritable bowel syndrome)   . Mental disorder   . Preoperative examination 02/26/2013  . 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 Shortness of breath     Patient Active Problem List   Diagnosis Date Noted  . Right knee pain 10/14/2015  . Actinic keratoses 07/02/2015  . Injury of right hand 04/27/2014  . Chest wall tenderness 04/27/2014  . Traumatic injury of lower extremity 03/25/2014  . Obesity (BMI 30.0-34.9) 01/30/2014  . Health care maintenance 01/30/2014  . 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  .  Diastolic CHF ([place]) 0000000  . Fibromyalgia 08/14/2012  . GERD with stricture 03/04/2012  . Leg edema 03/04/2012  . CATARACT, LEFT EYE 08/23/2010  . Abnormality of gait 08/23/2010  . 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 ANEMIA, IRON DEFICIENCY 03/06/2009  . 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 SYMPTOM, INCONTINENCE W/O SENSORY AWARENESS 12/26/2006  . Memory loss 12/26/2006  . Hypothyroidism 11/01/2006  . DEPRESSION, MAJOR, RECURRENT 11/01/2006  . PANIC ATTACKS 11/01/2006  . Migraine headache 11/01/2006  . Carpal tunnel syndrome 11/01/2006  . 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 Irritable bowel syndrome 11/01/2006  . OSTEOPENIA 11/01/2006    Past Surgical History:  Procedure Laterality Date  . ABDOMINAL HYSTERECTOMY    . APPENDECTOMY    . BACK SURGERY  1990's  . BREAST SURGERY  1972   augmentation   . CHOLECYSTECTOMY    . GASTRECTOMY  1994   Due to ulcer, required multiple revisions.   Marland Kitchen HERNIA REPAIR    . ORIF ANKLE FRACTURE Right 2004  . REPLACEMENT TOTAL KNEE    . REVERSE SHOULDER ARTHROPLASTY Right 05/16/2013   Procedure: RIGHT HEMI ARTHROPLASTY  VERSES  REVERSE TOTAL SHOULDER ARTHROPLASTY ;  Surgeon: Augustin Schooling, MD;  Location: [facility];  Service: Orthopedics;  Laterality: Right;  . SPINE SURGERY  2005   Lumbar spinal stenosis   . STOMACH SURGERY     1/3 resection for obstruction  . TONSILLECTOMY    . TUBAL LIGATION  OB History    No data available       Home Medications    Prior to Admission medications   Medication Sig Start Date End Date Taking? Authorizing Provider  aspirin 500 MG EC tablet Take 500 mg by mouth daily as needed for pain.   Yes Historical Provider, MD  Cholecalciferol (VITAMIN D-3) 1000 units CAPS Take 1,000 Units by mouth.   Yes Historical Provider, MD  Cholecalciferol (VITAMIN D3) 2000 UNIT capsule Take 1,000 Units by mouth every morning.    Yes Historical Provider, MD  clonazePAM (KLONOPIN) 1 MG tablet Take 1 mg by mouth 2  (two) times daily. Per Dr. Toy Care   Yes Historical Provider, MD  Coenzyme Q10 (CO Q-10) 300 MG CAPS Take 1 capsule by mouth daily.     Yes Historical Provider, MD  DULoxetine (CYMBALTA) 30 MG capsule Take 30 mg by mouth 2 (two) times daily. 60 mg in am and 30 mg in pm    Yes Historical Provider, MD  fluticasone (FLONASE) 50 MCG/ACT nasal spray Place 2 sprays into the nose daily. Patient taking differently: Place 2 sprays into the nose every evening.  07/10/12  Yes Katherina Mires, MD  frovatriptan (FROVA) 2.5 MG tablet Take 2.5 mg by mouth as needed. If recurs, may repeat after 2 hours. Max of 3 tabs in 24 hours.  Per Dr. Thana Ates    Yes Historical Provider, MD  furosemide (LASIX) 20 MG tablet Take 1 tab daily only when needed for lower extremity swelling or increase in weight Patient taking differently: Take 20 mg by mouth daily as needed for fluid. Take 1 tab daily only when needed for lower extremity swelling or increase in weight 07/07/15  Yes Alveda Reasons, MD  levothyroxine (SYNTHROID, LEVOTHROID) 112 MCG tablet Take 112 mcg by mouth at bedtime.   Yes Historical Provider, MD  Multiple Vitamins-Minerals (CENTRUM SILVER PO) Take 1 tablet by mouth daily.    Yes Historical Provider, MD  ondansetron (ZOFRAN) 4 MG tablet Take 4 mg by mouth every 8 (eight) hours as needed. for migraine per Dr. Collene Mares   Yes Historical Provider, MD  oxyCODONE (ROXICODONE) 15 MG immediate release tablet Take 1 tablet (15 mg total) by mouth every 4 (four) hours as needed for pain. 02/28/16  Yes Alexander J Karamalegos, DO  pregabalin (LYRICA) 100 MG capsule Take 100 mg by mouth 5 (five) times daily. Per Dr. Payton Spark   Yes Historical Provider, MD  pregabalin (LYRICA) 150 MG capsule Take 150 mg by mouth 3 (three) times daily. 02/11/16 02/10/17 Yes Historical Provider, MD  QUEtiapine (SEROQUEL) 50 MG tablet Take 50 mg by mouth at bedtime.     Yes Historical Provider, MD  ramelteon (ROZEREM) 8 MG tablet Take 8 mg by mouth at bedtime.  Per Dr. Toy Care    Yes Historical Provider, MD  S-Adenosylmethionine (MOOD PLUS SAM-E DOUBLE ST) 400 MG TBEC Take 1 tablet by mouth daily.   Yes Historical Provider, MD  S-Adenosylmethionine (SAM-E) 200 MG TABS Take 2 tablets by mouth daily. Per patient   Yes Historical Provider, MD  tiZANidine (ZANAFLEX) 4 MG tablet Take 2-4 mg by mouth 2 (two) times daily.   Yes Historical Provider, MD  Turmeric (RA TURMERIC) 500 MG CAPS Take 500 mg by mouth 2 (two) times daily.   Yes Historical Provider, MD    Family History Family History  Problem Relation Age of Onset  . Aneurysm Mother   . Cancer Father     pancreatic  .  Stroke Father 47  . Heart disease Maternal Grandfather     Social History Social History  Substance Use Topics  . Smoking status: Never Smoker  . Smokeless tobacco: Never Used  . Alcohol use No     Comment: rare use of white wine      Allergies   Bupropion hcl; Dairy aid [lactase]; Azithromycin; and Erythromycin   Review of Systems Review of Systems  Constitutional: Negative for chills, fatigue and fever.  HENT: Negative for ear pain and sore throat.   Eyes: Negative for pain and visual disturbance.  Respiratory: Negative for cough and shortness of breath.   Cardiovascular: Negative for chest pain and palpitations.  Gastrointestinal: Positive for nausea. Negative for abdominal pain, anorexia, change in bowel habit and vomiting.  Genitourinary: Negative for dysuria and hematuria.  Musculoskeletal: Negative for arthralgias and back pain.  Skin: Negative for color change and rash.  Neurological: Positive for headaches. Negative for dizziness, vertigo, tremors, seizures, syncope, facial asymmetry, speech difficulty, weakness, light-headedness and numbness.  All other systems reviewed and are negative.    Physical Exam Updated Vital Signs BP 123/80   Pulse 66   Temp 97.9 F (36.6 C) (Oral)   Resp 20   Ht 5\' 5"  (1.651 m)   Wt 97.1 kg   SpO2 (!) 89%   BMI 35.61  kg/m   Physical Exam  Constitutional: She appears well-developed and well-nourished. No distress.  HENT:  Head: Normocephalic and atraumatic.  Eyes: Conjunctivae are normal.  Neck: Neck supple.  Cardiovascular: Normal rate and regular rhythm.   No murmur heard. Pulmonary/Chest: Effort normal and breath sounds normal. No respiratory distress.  Abdominal: Soft. There is no tenderness.  Musculoskeletal: She exhibits no edema.  Neurological: She is alert. She has normal strength. She displays no atrophy and no tremor. No cranial nerve deficit or sensory deficit. She exhibits normal muscle tone. Coordination and gait normal. GCS eye subscore is 4. GCS verbal subscore is 5. GCS motor subscore is 6.  Patient with normal strength and sensation bilateral upper lower extremities. Normal cranial nerve exam. Normal deep tendon reflexes, normal coordination, normal gait.  Skin: Skin is warm and dry.  Psychiatric: She has a normal mood and affect.  Nursing note and vitals reviewed.    ED Treatments / Results  Labs (all labs ordered are listed, but only abnormal results are displayed) Labs Reviewed - No data to display  EKG  EKG Interpretation None       Radiology No results found.  Procedures Procedures (including critical care time)  Medications Ordered in ED Medications  ondansetron (ZOFRAN-ODT) disintegrating tablet 4 mg (4 mg Oral Given 04/03/16 1545)  sodium chloride 0.9 % bolus 1,000 mL (0 mLs Intravenous Stopped 04/03/16 2118)  prochlorperazine (COMPAZINE) injection 10 mg (10 mg Intravenous Given 04/03/16 1940)  diphenhydrAMINE (BENADRYL) injection 25 mg (25 mg Intravenous Given 04/03/16 1942)  dexamethasone (DECADRON) injection 10 mg (10 mg Intravenous Given 04/03/16 1939)  ketorolac (TORADOL) 15 MG/ML injection 15 mg (15 mg Intravenous Given 04/03/16 1941)  magnesium sulfate IVPB 2 g 50 mL (0 g Intravenous Stopped 04/03/16 2118)  sodium chloride 0.9 % bolus 1,000 mL (0 mLs  Intravenous Stopped 04/03/16 2224)  valproate (DEPACON) 500 mg in dextrose 5 % 50 mL IVPB (0 mg Intravenous Stopped 04/03/16 2218)  HYDROmorphone (DILAUDID) injection 1 mg (1 mg Intravenous Given 04/03/16 2223)     Initial Impression / Assessment and Plan / ED Course  I have reviewed the triage vital  signs and the nursing notes.  Pertinent labs & imaging results that were available during my care of the patient were reviewed by me and considered in my medical decision making (see chart for details).  Clinical Course   Patient presents with headache 4 days. States this is very typical to her prior migraines. Headache on right side of head, nausea, photosensitivity. Patient attempted to take her home medications including oxycodone prescribed by her back doctor. She states minimal improvement with these interventions.    Vital signs stable. Patient wearing sunglasses and ice pack. No focal neurologic deficits on cranial nerve exam or on upper or lower extremity exam. No sensory deficits.  Do not suspect intracranial process, no signs of meningitis. Exam and history consistent with migraine headache. Patient states migraines well-controlled on previous regimen including OxyContin. She has been off this medication for roughly 6 months.  Patient has follow-up appointment with neurologist in 3-4 days. Patient requesting 2-4 mg of Dilaudid for help with her headache.  We'll treat with migraine cocktail, Compazine/Benadryl/Toradol/Decadron/normal saline bolus and reevaluate patient.   8:15 PM: Patient reevaluated. Continues at 10/10 pain. Ordered Depacon, magnesium, additional normal saline bolus or patient. She is tearful on exam and requesting 2 mg of Dilaudid for pain. I told her that we have to work through our migraine algorithm and that narcotics are not indicated for migraines. Patient tearful and requested to leave. I was able to discuss patient and her husband and they agree to stay for  treatment.  11:11 PM: Patient reevaluated after Depacon, magnesium, Dilaudid. Patient reports resolution of her headache and wishes to be discharged home. She is following up with Dr. Doy Mince on Wednesday for long-term management treatment of her chronic migraines.  Patient able to tolerate oral intake, ambulatory no acute distress at time of discharge.  Discussed case with my attending, Dr. Vanita Panda.   Final Clinical Impressions(s) / ED Diagnoses   Final diagnoses:  Migraine without aura and without status migrainosus, not intractable    New Prescriptions New Prescriptions   No medications on file     Vira Blanco, MD 04/03/16 [place], MD 04/04/16 [phone]

## 2016-04-03 NOTE — ED Triage Notes (Signed)
Pt c/o migraine started 5 days ago, has used medication for migraines, has appt with new neurologist on Wednesday-- can't stand the pain at present

## 2016-04-03 NOTE — ED Notes (Signed)
Pt stable, ambulatory, states understanding of discharge instructions 

## 2016-04-03 NOTE — ED Notes (Signed)
Pt yelling out door of room "the doctor isn't treating my pain, I want to go home", "the medicine he gave me isn't working".  Dr. Adela Glimpse notified and at bedside.

## 2016-04-04 ENCOUNTER — Other Ambulatory Visit: Payer: Self-pay | Admitting: Family Medicine

## 2016-04-05 DIAGNOSIS — Z79899 Other long term (current) drug therapy: Secondary | ICD-10-CM | POA: Diagnosis not present

## 2016-04-05 DIAGNOSIS — G43109 Migraine with aura, not intractable, without status migrainosus: Secondary | ICD-10-CM | POA: Diagnosis not present

## 2016-04-05 DIAGNOSIS — Z881 Allergy status to other antibiotic agents status: Secondary | ICD-10-CM | POA: Diagnosis not present

## 2016-04-05 DIAGNOSIS — M797 Fibromyalgia: Secondary | ICD-10-CM | POA: Diagnosis not present

## 2016-04-05 DIAGNOSIS — E039 Hypothyroidism, unspecified: Secondary | ICD-10-CM | POA: Diagnosis not present

## 2016-04-05 DIAGNOSIS — Z888 Allergy status to other drugs, medicaments and biological substances status: Secondary | ICD-10-CM | POA: Diagnosis not present

## 2016-04-07 DIAGNOSIS — M1712 Unilateral primary osteoarthritis, left knee: Secondary | ICD-10-CM | POA: Diagnosis not present

## 2016-04-10 DIAGNOSIS — R2689 Other abnormalities of gait and mobility: Secondary | ICD-10-CM | POA: Diagnosis not present

## 2016-04-10 DIAGNOSIS — Z96651 Presence of right artificial knee joint: Secondary | ICD-10-CM | POA: Diagnosis not present

## 2016-04-10 DIAGNOSIS — R296 Repeated falls: Secondary | ICD-10-CM | POA: Diagnosis not present

## 2016-04-10 DIAGNOSIS — J45909 Unspecified asthma, uncomplicated: Secondary | ICD-10-CM | POA: Diagnosis not present

## 2016-04-10 DIAGNOSIS — Z471 Aftercare following joint replacement surgery: Secondary | ICD-10-CM | POA: Diagnosis not present

## 2016-04-10 DIAGNOSIS — K589 Irritable bowel syndrome without diarrhea: Secondary | ICD-10-CM | POA: Diagnosis not present

## 2016-04-12 DIAGNOSIS — J45909 Unspecified asthma, uncomplicated: Secondary | ICD-10-CM | POA: Diagnosis not present

## 2016-04-12 DIAGNOSIS — R296 Repeated falls: Secondary | ICD-10-CM | POA: Diagnosis not present

## 2016-04-12 DIAGNOSIS — K589 Irritable bowel syndrome without diarrhea: Secondary | ICD-10-CM | POA: Diagnosis not present

## 2016-04-12 DIAGNOSIS — Z96651 Presence of right artificial knee joint: Secondary | ICD-10-CM | POA: Diagnosis not present

## 2016-04-12 DIAGNOSIS — Z471 Aftercare following joint replacement surgery: Secondary | ICD-10-CM | POA: Diagnosis not present

## 2016-04-12 DIAGNOSIS — R2689 Other abnormalities of gait and mobility: Secondary | ICD-10-CM | POA: Diagnosis not present

## 2016-04-17 DIAGNOSIS — R296 Repeated falls: Secondary | ICD-10-CM | POA: Diagnosis not present

## 2016-04-17 DIAGNOSIS — J45909 Unspecified asthma, uncomplicated: Secondary | ICD-10-CM | POA: Diagnosis not present

## 2016-04-17 DIAGNOSIS — Z96651 Presence of right artificial knee joint: Secondary | ICD-10-CM | POA: Diagnosis not present

## 2016-04-17 DIAGNOSIS — K589 Irritable bowel syndrome without diarrhea: Secondary | ICD-10-CM | POA: Diagnosis not present

## 2016-04-17 DIAGNOSIS — R2689 Other abnormalities of gait and mobility: Secondary | ICD-10-CM | POA: Diagnosis not present

## 2016-04-17 DIAGNOSIS — Z471 Aftercare following joint replacement surgery: Secondary | ICD-10-CM | POA: Diagnosis not present

## 2016-04-19 DIAGNOSIS — Z96651 Presence of right artificial knee joint: Secondary | ICD-10-CM | POA: Diagnosis not present

## 2016-04-19 DIAGNOSIS — R2689 Other abnormalities of gait and mobility: Secondary | ICD-10-CM | POA: Diagnosis not present

## 2016-04-19 DIAGNOSIS — J45909 Unspecified asthma, uncomplicated: Secondary | ICD-10-CM | POA: Diagnosis not present

## 2016-04-19 DIAGNOSIS — K589 Irritable bowel syndrome without diarrhea: Secondary | ICD-10-CM | POA: Diagnosis not present

## 2016-04-19 DIAGNOSIS — Z471 Aftercare following joint replacement surgery: Secondary | ICD-10-CM | POA: Diagnosis not present

## 2016-04-19 DIAGNOSIS — R296 Repeated falls: Secondary | ICD-10-CM | POA: Diagnosis not present

## 2016-04-24 DIAGNOSIS — J45909 Unspecified asthma, uncomplicated: Secondary | ICD-10-CM | POA: Diagnosis not present

## 2016-04-24 DIAGNOSIS — Z471 Aftercare following joint replacement surgery: Secondary | ICD-10-CM | POA: Diagnosis not present

## 2016-04-24 DIAGNOSIS — R2689 Other abnormalities of gait and mobility: Secondary | ICD-10-CM | POA: Diagnosis not present

## 2016-04-24 DIAGNOSIS — Z96651 Presence of right artificial knee joint: Secondary | ICD-10-CM | POA: Diagnosis not present

## 2016-04-24 DIAGNOSIS — R296 Repeated falls: Secondary | ICD-10-CM | POA: Diagnosis not present

## 2016-04-24 DIAGNOSIS — K589 Irritable bowel syndrome without diarrhea: Secondary | ICD-10-CM | POA: Diagnosis not present

## 2016-04-26 DIAGNOSIS — R2689 Other abnormalities of gait and mobility: Secondary | ICD-10-CM | POA: Diagnosis not present

## 2016-04-26 DIAGNOSIS — Z96651 Presence of right artificial knee joint: Secondary | ICD-10-CM | POA: Diagnosis not present

## 2016-04-26 DIAGNOSIS — K589 Irritable bowel syndrome without diarrhea: Secondary | ICD-10-CM | POA: Diagnosis not present

## 2016-04-26 DIAGNOSIS — J45909 Unspecified asthma, uncomplicated: Secondary | ICD-10-CM | POA: Diagnosis not present

## 2016-04-26 DIAGNOSIS — Z471 Aftercare following joint replacement surgery: Secondary | ICD-10-CM | POA: Diagnosis not present

## 2016-04-26 DIAGNOSIS — R296 Repeated falls: Secondary | ICD-10-CM | POA: Diagnosis not present

## 2016-05-03 DIAGNOSIS — Z96651 Presence of right artificial knee joint: Secondary | ICD-10-CM | POA: Diagnosis not present

## 2016-05-03 DIAGNOSIS — K589 Irritable bowel syndrome without diarrhea: Secondary | ICD-10-CM | POA: Diagnosis not present

## 2016-05-03 DIAGNOSIS — Z471 Aftercare following joint replacement surgery: Secondary | ICD-10-CM | POA: Diagnosis not present

## 2016-05-03 DIAGNOSIS — J45909 Unspecified asthma, uncomplicated: Secondary | ICD-10-CM | POA: Diagnosis not present

## 2016-05-03 DIAGNOSIS — R2689 Other abnormalities of gait and mobility: Secondary | ICD-10-CM | POA: Diagnosis not present

## 2016-05-03 DIAGNOSIS — R296 Repeated falls: Secondary | ICD-10-CM | POA: Diagnosis not present

## 2016-05-10 DIAGNOSIS — K589 Irritable bowel syndrome without diarrhea: Secondary | ICD-10-CM | POA: Diagnosis not present

## 2016-05-10 DIAGNOSIS — Z471 Aftercare following joint replacement surgery: Secondary | ICD-10-CM | POA: Diagnosis not present

## 2016-05-10 DIAGNOSIS — J45909 Unspecified asthma, uncomplicated: Secondary | ICD-10-CM | POA: Diagnosis not present

## 2016-05-10 DIAGNOSIS — R2689 Other abnormalities of gait and mobility: Secondary | ICD-10-CM | POA: Diagnosis not present

## 2016-05-10 DIAGNOSIS — R296 Repeated falls: Secondary | ICD-10-CM | POA: Diagnosis not present

## 2016-05-10 DIAGNOSIS — Z96651 Presence of right artificial knee joint: Secondary | ICD-10-CM | POA: Diagnosis not present

## 2016-05-15 DIAGNOSIS — Z96651 Presence of right artificial knee joint: Secondary | ICD-10-CM | POA: Diagnosis not present

## 2016-05-15 DIAGNOSIS — R296 Repeated falls: Secondary | ICD-10-CM | POA: Diagnosis not present

## 2016-05-15 DIAGNOSIS — Z471 Aftercare following joint replacement surgery: Secondary | ICD-10-CM | POA: Diagnosis not present

## 2016-05-15 DIAGNOSIS — R2689 Other abnormalities of gait and mobility: Secondary | ICD-10-CM | POA: Diagnosis not present

## 2016-05-15 DIAGNOSIS — K589 Irritable bowel syndrome without diarrhea: Secondary | ICD-10-CM | POA: Diagnosis not present

## 2016-05-15 DIAGNOSIS — J45909 Unspecified asthma, uncomplicated: Secondary | ICD-10-CM | POA: Diagnosis not present

## 2016-05-30 DIAGNOSIS — Z96651 Presence of right artificial knee joint: Secondary | ICD-10-CM | POA: Diagnosis not present

## 2016-05-30 DIAGNOSIS — M1712 Unilateral primary osteoarthritis, left knee: Secondary | ICD-10-CM | POA: Diagnosis not present

## 2016-07-25 DIAGNOSIS — G894 Chronic pain syndrome: Secondary | ICD-10-CM | POA: Diagnosis not present

## 2016-07-25 DIAGNOSIS — G8929 Other chronic pain: Secondary | ICD-10-CM | POA: Diagnosis not present

## 2016-07-25 DIAGNOSIS — M5441 Lumbago with sciatica, right side: Secondary | ICD-10-CM | POA: Diagnosis not present

## 2016-07-25 DIAGNOSIS — Z79891 Long term (current) use of opiate analgesic: Secondary | ICD-10-CM | POA: Diagnosis not present

## 2016-08-18 ENCOUNTER — Ambulatory Visit: Payer: Medicare Other | Admitting: Student

## 2016-08-22 ENCOUNTER — Encounter: Payer: Self-pay | Admitting: Family Medicine

## 2016-08-22 ENCOUNTER — Ambulatory Visit (INDEPENDENT_AMBULATORY_CARE_PROVIDER_SITE_OTHER): Payer: Medicare Other | Admitting: Family Medicine

## 2016-08-22 DIAGNOSIS — L03011 Cellulitis of right finger: Secondary | ICD-10-CM | POA: Diagnosis present

## 2016-08-22 DIAGNOSIS — Z23 Encounter for immunization: Secondary | ICD-10-CM | POA: Diagnosis not present

## 2016-08-22 NOTE — Patient Instructions (Addendum)
It was a pleasure seeing you today in our clinic. Today we discussed your fingertip infection. Here is the treatment plan we have discussed and agreed upon together:  - Continue to use the bacitracin cream over the next week. - Soak the affected finger in warm water 2-3 times a day over the next 1 week. - Throughout the daytime to keep this area clean and dry. - Do not scrub or pick at this area.     Fingertip Infection Introduction There are two main types of fingertip infections:  Paronychia. This is an infection that happens around your nail. This type of infection can start suddenly in one nail or occur gradually over time and affect more than one nail. Long-term (chronic) paronychia can make your fingernails thick and deformed.  Felon. This is a bacterial infection in the padded tip of your finger. Felon infection can cause a painful collection of pus (abscess) to form inside your fingertip. If the infection is not treated, the infection can spread as deep as the bone. What are the causes? Paronychia infection can be caused by bacteria, funguses, or a mix of both. Felon infection is usually caused by the bacteria that are normally found on your skin. An infection can develop if the bacteria spread through your skin to the pad of tissue inside your fingertip. What increases the risk? A fingertip infection is more likely to develop in people:  Who have diabetes.  Who have a weak body defense (immune) system.  Who work with their hands.  Whose hands are exposed to moisture, chemicals, or irritants for long periods of time.  Who have poor circulation.  Who bite, chew, or pick their fingernails. What are the signs or symptoms? Symptoms of paronychia may affect one or more fingernails and include:  Pain, swelling, and redness around the nail.  Pus-filled pockets at the base or side of the fingernail (cuticle).  Thick fingernails that separate from the nail bed.  Pus that  drains from the nail bed. Symptoms of felon usually affect just one fingertip pad and include:  Severe, throbbing pain.  Redness.  Swelling.  Warmth.  Tenderness when the affected fingertip is touched. How is this diagnosed? A fingertip infection is diagnosed with a medical history and physical exam. If there is pus draining from the infection, it may be swabbed and sent to the lab for a culture. An X-ray may be done to see if the infection has spread to the bone. How is this treated? Treatment for a fingertip infection may include:  Warm water or salt-water soaks several times per day.  Antibiotic medicine. This may be an ointment or pills.  Steroid ointment.  Antifungal pills.  Drainage of pus pockets. This is done by making a surgical cut (incision) to open the fingertip to drain pus.  Wearing gloves to protect your nails. Follow these instructions at home: Medicines  Take or apply over-the-counter and prescription medicines only as told by your health care provider.  If you were prescribed antibiotic medicine, take or apply it as told by your health care provider. Do not stop using the antibiotic even if your condition improves. Wound care  Clean the infected area each day with warm water or salt water, or as told by your health care provider.  Gently wash the infected area with mild soap and water.  Rinse the infected area with water to remove all soap.  Pat the infected area dry with a clean towel. Do not rub it.  To make a salt-water mixture, completely dissolve -1 tsp of salt in 1 cup of warm water.  Follow instructions from your health care provider about:  How to take care of the infection.  When and how you should change your bandage (dressing).  When you should remove your dressing.  Check the infected area every day for more signs of infection. Watch for:  More redness, swelling, or pain.  More fluid or blood.  Warmth.  A bad smell. General  instructions  Keep the dressing dry until your health care provider says it can be removed. Do not take baths, swim, use a hot tub, or do anything that would put your wound underwater until your health care provider approves.  Raise (elevate) the infected area above the level of your heart while you are sitting or lying down or as told by your health care provider.  Do not scratch or pick at the infection.  Wear gloves as told by your health care provider, if this applies.  Keep all follow-up visits as told by your health care provider. This is important. How is this prevented?  Wear gloves when you work with your hands.  Wash your hands often with antibacterial soap.  Avoid letting your hands stay wet or irritated for long periods of time.  Do not bite your fingernails. Do not pull on your cuticles. Do not suck on your fingers.  Use clean scissors or nail clippers to trim your nails. Do not cut your fingernails very short. Contact a health care provider if:  Your pain medicine is not helping.  You have more redness, swelling, or pain at your fingertip.  You continue to have fluid, blood, or pus coming from your fingertip.  Your infection area feels warm to the touch.  You continue to notice a bad smell coming from your fingertip or your dressing. Get help right away if:  The area of redness is spreading, or you notice a red streak going away from your fingertip.  You have a fever. This information is not intended to replace advice given to you by your health care provider. Make sure you discuss any questions you have with your health care provider. Document Released: 09/28/2004 Document Revised: 01/27/2016 Document Reviewed: 02/08/2015  2017 [employer]vier

## 2016-08-22 NOTE — Assessment & Plan Note (Signed)
Patient is here with signs and symptoms consistent with her neck area of the right index finger. Patient endorsed previous evidence of purulence however she was able to lance this at home with good drainage and improvement in symptoms after. - Bacitracin daily for 3-5 days (until erythema clears/improves) - Warm water soaks to 3 times a day 20 minutes - Keep clean and dry throughout most of the day. - Return precautions discussed

## 2016-08-22 NOTE — Progress Notes (Signed)
   HPI  CC: Paronychia, right index Patient is here with complaints of a right index finger infection. She states that she first noticed the redness and tenderness approximately one week ago. This infection gradually worsened and developed some areas of purulence. Approximately 2-3 days ago she then "drained it" at home. She endorses "a lot" of purulence and some blood with the drainage. She had some improvement in the pain and tenderness shortly after draining the infection. She denies any persistent drainage or current purulence.  Patient denies any systemic symptoms. No significant pain with flexion or extension of the affected finger. No extension of erythema beyond the joint line. She does endorse some tightness of the affected finger when she tries to flex it.  Patient denies any fever, chills, nausea, vomiting, diarrhea, headache, dizziness, blurred vision.  Review of Systems    See HPI for ROS. All other systems reviewed and are negative.  CC, SH/smoking status, and VS noted  Objective: BP 96/60   Pulse (!) 104   Temp 98.1 F (36.7 C) (Oral)   Ht 5\' 5"  (1.651 m)   Wt 209 lb (94.8 kg)   BMI 34.78 kg/m  Gen: NAD, alert, cooperative. CV: Well-perfused. Resp: Non-labored. Neuro: Sensation intact throughout. Finger, Right, Index: ROM intact. Erythema noted along the radial aspect of the nail cuticle. This erythema expands outward across the palmar aspect of the pad. At the edges of erythema there is some epithelial scaling. Along the radial aspect of the nailbed there is healing tissue and a scab. No evidence of purulence. No active drainage. Capillary refill is intact and equal throughout. Sensation intact and equal to adjacent fingers.   Assessment and plan:  Paronychia of finger of right hand Patient is here with signs and symptoms consistent with her neck area of the right index finger. Patient endorsed previous evidence of purulence however she was able to lance this at home  with good drainage and improvement in symptoms after. - Bacitracin daily for 3-5 days (until erythema clears/improves) - Warm water soaks to 3 times a day 20 minutes - Keep clean and dry throughout most of the day. - Return precautions discussed    Elberta Leatherwood, MD,MS,  PGY3 08/22/2016 3:03 PM

## 2016-09-12 ENCOUNTER — Encounter: Payer: Self-pay | Admitting: *Deleted

## 2016-09-12 ENCOUNTER — Ambulatory Visit (INDEPENDENT_AMBULATORY_CARE_PROVIDER_SITE_OTHER): Payer: Medicare Other | Admitting: *Deleted

## 2016-09-12 VITALS — BP 88/64 | HR 84 | Temp 98.0°F | Ht 65.0 in | Wt 210.8 lb

## 2016-09-12 DIAGNOSIS — Z0000 Encounter for general adult medical examination without abnormal findings: Secondary | ICD-10-CM

## 2016-09-12 NOTE — Progress Notes (Signed)
Subjective:   [name] is a 71 y.o. female who presents with husband via wheel chair for an Initial Medicare Annual Wellness Visit.   Cardiac Risk Factors include: obesity (BMI >30kg/m2);sedentary lifestyle     Objective:    Today's Vitals   09/12/16 1442  BP: (!) 88/64  Pulse: 84  Temp: 98 F (36.7 C)  TempSrc: Oral  SpO2: 99%  Weight: 210 lb 12.8 oz (95.6 kg)  Height: 5\' 5"  (1.651 m)   Body mass index is 35.08 kg/m. BP 88/64 Preceptor, Dr. Andria Frames, notified of BP. Patient instructed to f/u with PCP regarding BP and multiple sedating meds as soon as possible. Patient has appt with Lillard Anes, NP on 09/27/2016 at different [facility] and may be switching offices.  Current Medications (verified) Outpatient Encounter Prescriptions as of 09/12/2016  Medication Sig  . aspirin 500 MG EC tablet Take 500 mg by mouth daily as needed for pain.  . Cholecalciferol (VITAMIN D-3) 1000 units CAPS Take 1,000 Units by mouth.  . clonazePAM (KLONOPIN) 1 MG tablet Take 1 mg by mouth 2 (two) times daily. Per Dr. Toy Care  . Coenzyme Q10 (CO Q-10) 300 MG CAPS Take 1 capsule by mouth daily.    . fluticasone (FLONASE) 50 MCG/ACT nasal spray Place 2 sprays into the nose daily. (Patient taking differently: Place 2 sprays into the nose every evening. )  . frovatriptan (FROVA) 2.5 MG tablet Take 2.5 mg by mouth as needed. If recurs, may repeat after 2 hours. Max of 3 tabs in 24 hours.  Per Dr. Thana Ates   . furosemide (LASIX) 20 MG tablet Take 1 tab daily only when needed for lower extremity swelling or increase in weight (Patient taking differently: Take 20 mg by mouth daily as needed for fluid. Take 1 tab daily only when needed for lower extremity swelling or increase in weight)  . levothyroxine (SYNTHROID, LEVOTHROID) 100 MCG tablet TAKE 1 TABLET BY MOUTH DAILY BEFORE BREAKFAST  . Multiple Vitamins-Minerals (CENTRUM SILVER PO) Take 1 tablet by mouth daily.   . ondansetron (ZOFRAN) 4 MG tablet Take 4 mg  by mouth every 8 (eight) hours as needed. for migraine per Dr. Collene Mares  . oxyCODONE (ROXICODONE) 15 MG immediate release tablet Take 1 tablet (15 mg total) by mouth every 4 (four) hours as needed for pain.  . pregabalin (LYRICA) 150 MG capsule Take 150 mg by mouth 3 (three) times daily.  . QUEtiapine (SEROQUEL) 50 MG tablet Take 50 mg by mouth at bedtime.    Marland Kitchen tiZANidine (ZANAFLEX) 4 MG tablet Take 2-4 mg by mouth 2 (two) times daily.  . Turmeric (RA TURMERIC) 500 MG CAPS Take 500 mg by mouth 2 (two) times daily.  Marland Kitchen vortioxetine HBr (TRINTELLIX) 20 MG TABS Take 20 mg by mouth daily.  . ramelteon (ROZEREM) 8 MG tablet Take 8 mg by mouth at bedtime. Per Dr. Toy Care   . S-Adenosylmethionine (MOOD PLUS SAM-E DOUBLE ST) 400 MG TBEC Take 1 tablet by mouth daily.  . S-Adenosylmethionine (SAM-E) 200 MG TABS Take 2 tablets by mouth daily. Per patient  . [DISCONTINUED] Cholecalciferol (VITAMIN D3) 2000 UNIT capsule Take 1,000 Units by mouth every morning.   . [DISCONTINUED] DULoxetine (CYMBALTA) 30 MG capsule Take 30 mg by mouth 2 (two) times daily. 60 mg in am and 30 mg in pm   . [DISCONTINUED] pregabalin (LYRICA) 100 MG capsule Take 100 mg by mouth 5 (five) times daily. Per Dr. Payton Spark   No facility-administered  encounter medications on file as of 09/12/2016.     Allergies (verified) Bupropion hcl; Dairy aid [lactase]; Azithromycin; and Erythromycin   History: Past Medical History:  Diagnosis Date  . Anemia    took Fe- orally 2 yrs. ago   . Anxiety    panic attacks   . Arthritis    shoulders, knees  . Cancer ([place])    skin- preCA  . Depression   . GERD (gastroesophageal reflux disease)    uses Protonix as needed   . Headache(784.0)   . Humerus fracture    Right   . Hyposmolality and/or hyponatremia 02/27/2013  . Hypothyroidism   . IBS (irritable bowel syndrome)   . Mental disorder   . Preoperative examination 02/26/2013  . 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 Shortness of breath    Past Surgical History:  Procedure Laterality Date  . ABDOMINAL HYSTERECTOMY    . APPENDECTOMY    . BACK SURGERY  1990's  . BREAST SURGERY  1972   augmentation   . CHOLECYSTECTOMY    . GASTRECTOMY  1994   Due to ulcer, required multiple revisions.   Marland Kitchen HERNIA REPAIR    . ORIF ANKLE FRACTURE Right 2004  . REPLACEMENT TOTAL KNEE    . REVERSE SHOULDER ARTHROPLASTY Right 05/16/2013   Procedure: RIGHT HEMI ARTHROPLASTY  VERSES  REVERSE TOTAL SHOULDER ARTHROPLASTY ;  Surgeon: Augustin Schooling, MD;  Location: [facility];  Service: Orthopedics;  Laterality: Right;  . SPINE SURGERY  2005   Lumbar spinal stenosis   . STOMACH SURGERY     1/3 resection for obstruction  . TONSILLECTOMY    . TUBAL LIGATION     Family History  Problem Relation Age of Onset  . Aneurysm Mother   . Heart disease Mother   . Cancer Father     pancreatic  . Stroke Father 40  . Hypertension Father   . Alcohol abuse Father   . Heart disease Maternal Grandfather    Social History   Occupational History  . Not on file.   Social History Main Topics  . Smoking status: Never Smoker  . Smokeless tobacco: Never Used  . Alcohol use No     Comment: rare use of white wine   . Drug use: No  . Sexual activity: Not on file    Tobacco Counseling Counseling given: Yes Patient has never smoked and has no plans to start.   Activities of Daily Living In your present state of health, do you have any difficulty performing the following activities: 09/12/2016  Hearing? Y  Vision? N  Difficulty concentrating or making decisions? Y  Walking or climbing stairs? Y  Dressing or bathing? N  Doing errands, shopping? Y  Preparing Food and eating ? Y  Using the Toilet? N  In the past six months, have you accidently leaked urine? Y  Do you have problems with loss of bowel control? N  Managing your Medications? Y  Managing your Finances? Y  Housekeeping or managing your  Housekeeping? N  Some recent data might be hidden  Home Safety:  My home has a working smoke alarm:  Yes X 1           My home throw rugs have been fastened down to the floor or removed:  Fastened down I have non-slip mats in the bathtub and shower:  Yes  All my home's stairs have railings or bannisters: one level home. Patient has chair lift from home into garage         My home's floors, stairs and hallways are free from clutter, wires and cords:  Yes, in walking paths     I wear seatbelts consistently:  Yes    Immunizations and Health Maintenance Immunization History  Administered Date(s) Administered  . Influenza Whole 06/10/2007, 06/03/2009, 07/15/2010  . Influenza,inj,Quad PF,36+ Mos 09/17/2014, 07/02/2015, 08/22/2016  . Pneumococcal Conjugate-13 07/02/2015  . Pneumococcal Polysaccharide-23 08/11/2010  . Td 02/03/2003   Health Maintenance Due  Topic Date Due  . ZOSTAVAX  09/26/2003  . DEXA SCAN  09/25/2008  . TETANUS/TDAP  02/02/2013  . MAMMOGRAM  02/03/2016  [facility] and Dexa Scan. Contact info given. Patient will obtain TDaP at local pharmacy Discussed receiving Shingrix when available.  Patient Care Team: Leeanne Rio, MD as PCP - General (Family Medicine) Ileene Patrick, LCSW as Social Worker (Licensed Clinical Social Worker) Chucky May, MD as Financial risk analyst Physician (Psychiatry) Michela Pitcher. Laurance Flatten, MD as Physician Assistant ([place]) Margaretmary Lombard, PhD as Referring Physician (Psychology)  Indicate any recent Medical Services you may have received from other than [facility] providers in the past year (date may be approximate).     Assessment:   This is a routine wellness examination for [name].   Hearing/Vision screen  Hearing Screening   Method: Audiometry   125Hz  250Hz  500Hz  1000Hz  2000Hz  3000Hz  4000Hz  6000Hz  8000Hz   Right ear:   Fail 40 40  40    Left ear:   40 40 40  40       Dietary issues and exercise activities discussed: Current Exercise Habits: The patient does not participate in regular exercise at present, Exercise limited by: orthopedic condition(s);psychological condition(s)  Goals    . Exercise 6x per week (15 min per time)    . Weight (lb) < 195 lb (88.5 kg)          7 % weight loss     Discussed recording consumption intake to assist with desired 7% weight loss. Recommended MyPLate or My Fitness Pal apps to assist with recording. Discussed engaging in physical activity 150 min/week Discussed Silver Sneakers and the [facility]  Depression Screen PHQ 2/9 Scores 09/12/2016 10/12/2015 07/02/2015  PHQ - 2 Score 6 0 0  PHQ- 9 Score 21 - -  Patient has appt with psychologist next week 09/20/16. PCP notified.   Fall Risk Fall Risk  09/12/2016 10/12/2015 07/02/2015 04/27/2014 03/27/2013  Falls in the past year? Yes Yes Yes Yes Yes  Number falls in past yr: 1 - 2 or more 2 or more 2 or more  Injury with Fall? No - No - -  Risk Factor Category  - - - High Fall Risk High Fall Risk  Risk for fall due to : History of fall(s);Impaired balance/gait;Impaired mobility - - History of fall(s) -  Follow up Falls evaluation completed;Education provided;Falls prevention discussed - - - -  TUG Test:  Unable to perform. Patient sitting in wheelchair and does not have walker with her. Feels balance is decreased at present. Had near fall today in clinic parking lot stepping up onto curb. Was assisted by passer-by to prevent fall.  Cognitive Function: Mini-Cog  Failed with score 2/5   Screening Tests Health Maintenance  Topic Date Due  . ZOSTAVAX  09/26/2003  . DEXA SCAN  09/25/2008  . TETANUS/TDAP  02/02/2013  .  MAMMOGRAM  02/03/2016  . COLONOSCOPY  06/05/2023  . INFLUENZA VACCINE  Completed  . Hepatitis C Screening  Completed  . PNA vac Low Risk Adult  Completed      Plan:    [facility] mamm[facility] and Dexa Scan. Contact  info given. Patient will obtain TDaP at local pharmacy Discussed receiving Shingrix when available.   During the course of the visit, Eibhleann was educated and counseled about the following appropriate screening and preventive services:   Vaccines to include Pneumoccal, Influenza, Td, Zostavax/Shingrix  Cardiovascular disease screening  Colorectal cancer screening  Bone density screening  Diabetes screening  Mammography/PAP  Nutrition counseling  Patient Instructions (the written plan) were given to the patient.    Velora Heckler, RN   09/12/2016

## 2016-09-12 NOTE — Patient Instructions (Signed)
Fall Prevention in the Home Introduction Falls can cause injuries. They can happen to people of all ages. There are many things you can do to make your home safe and to help prevent falls. What can I do on the outside of my home?  Regularly fix the edges of walkways and driveways and fix any cracks.  Remove anything that might make you trip as you walk through a door, such as a raised step or threshold.  Trim any bushes or trees on the path to your home.  Use bright outdoor lighting.  Clear any walking paths of anything that might make someone trip, such as rocks or tools.  Regularly check to see if handrails are loose or broken. Make sure that both sides of any steps have handrails.  Any raised decks and porches should have guardrails on the edges.  Have any leaves, snow, or ice cleared regularly.  Use sand or salt on walking paths during winter.  Clean up any spills in your garage right away. This includes oil or grease spills. What can I do in the bathroom?  Use night lights.  Install grab bars by the toilet and in the tub and shower. Do not use towel bars as grab bars.  Use non-skid mats or decals in the tub or shower.  If you need to sit down in the shower, use a plastic, non-slip stool.  Keep the floor dry. Clean up any water that spills on the floor as soon as it happens.  Remove soap buildup in the tub or shower regularly.  Attach bath mats securely with double-sided non-slip rug tape.  Do not have throw rugs and other things on the floor that can make you trip. What can I do in the bedroom?  Use night lights.  Make sure that you have a light by your bed that is easy to reach.  Do not use any sheets or blankets that are too big for your bed. They should not hang down onto the floor.  Have a firm chair that has side arms. You can use this for support while you get dressed.  Do not have throw rugs and other things on the floor that can make you trip. What  can I do in the kitchen?  Clean up any spills right away.  Avoid walking on wet floors.  Keep items that you use a lot in easy-to-reach places.  If you need to reach something above you, use a strong step stool that has a grab bar.  Keep electrical cords out of the way.  Do not use floor polish or wax that makes floors slippery. If you must use wax, use non-skid floor wax.  Do not have throw rugs and other things on the floor that can make you trip. What can I do with my stairs?  Do not leave any items on the stairs.  Make sure that there are handrails on both sides of the stairs and use them. Fix handrails that are broken or loose. Make sure that handrails are as long as the stairways.  Check any carpeting to make sure that it is firmly attached to the stairs. Fix any carpet that is loose or worn.  Avoid having throw rugs at the top or bottom of the stairs. If you do have throw rugs, attach them to the floor with carpet tape.  Make sure that you have a light switch at the top of the stairs and the bottom of the stairs. If  you do not have them, ask someone to add them for you. What else can I do to help prevent falls?  Wear shoes that:  Do not have high heels.  Have rubber bottoms.  Are comfortable and fit you well.  Are closed at the toe. Do not wear sandals.  If you use a stepladder:  Make sure that it is fully opened. Do not climb a closed stepladder.  Make sure that both sides of the stepladder are locked into place.  Ask someone to hold it for you, if possible.  Clearly mark and make sure that you can see:  Any grab bars or handrails.  First and last steps.  Where the edge of each step is.  Use tools that help you move around (mobility aids) if they are needed. These include:  Canes.  Walkers.  Scooters.  Crutches.  Turn on the lights when you go into a dark area. Replace any light bulbs as soon as they burn out.  Set up your furniture so you  have a clear path. Avoid moving your furniture around.  If any of your floors are uneven, fix them.  If there are any pets around you, be aware of where they are.  Review your medicines with your doctor. Some medicines can make you feel dizzy. This can increase your chance of falling. Ask your doctor what other things that you can do to help prevent falls. This information is not intended to replace advice given to you by your health care provider. Make sure you discuss any questions you have with your health care provider. Document Released: 06/17/2009 Document Revised: 01/27/2016 Document Reviewed: 09/25/2014  2017 [employer]  Health Maintenance, Female Introduction Adopting a healthy lifestyle and getting preventive care can go a long way to promote health and wellness. Talk with your health care provider about what schedule of regular examinations is right for you. This is a good chance for you to check in with your provider about disease prevention and staying healthy. In between checkups, there are plenty of things you can do on your own. Experts have done a lot of research about which lifestyle changes and preventive measures are most likely to keep you healthy. Ask your health care provider for more information. Weight and diet Eat a healthy diet  Be sure to include plenty of vegetables, fruits, low-fat dairy products, and lean protein.  Do not eat a lot of foods high in solid fats, added sugars, or salt.  Get regular exercise. This is one of the most important things you can do for your health.  Most adults should exercise for at least 150 minutes each week. The exercise should increase your heart rate and make you sweat (moderate-intensity exercise).  Most adults should also do strengthening exercises at least twice a week. This is in addition to the moderate-intensity exercise. Maintain a healthy weight  Body mass index (BMI) is a measurement that can be used to identify  possible weight problems. It estimates body fat based on height and weight. Your health care provider can help determine your BMI and help you achieve or maintain a healthy weight.  For females 21 years of age and older:  A BMI below 18.5 is considered underweight.  A BMI of 18.5 to 24.9 is normal.  A BMI of 25 to 29.9 is considered overweight.  A BMI of 30 and above is considered obese. Watch levels of cholesterol and blood lipids  You should start having your blood tested  for lipids and cholesterol at 73 years of age, then have this test every 5 years.  You may need to have your cholesterol levels checked more often if:  Your lipid or cholesterol levels are high.  You are older than 73 years of age.  You are at high risk for heart disease. Cancer screening Lung Cancer  Lung cancer screening is recommended for adults 25-63 years old who are at high risk for lung cancer because of a history of smoking.  A yearly low-dose CT scan of the lungs is recommended for people who:  Currently smoke.  Have quit within the past 15 years.  Have at least a 30-pack-year history of smoking. A pack year is smoking an average of one pack of cigarettes a day for 1 year.  Yearly screening should continue until it has been 15 years since you quit.  Yearly screening should stop if you develop a health problem that would prevent you from having lung cancer treatment. Breast Cancer  Practice breast self-awareness. This means understanding how your breasts normally appear and feel.  It also means doing regular breast self-exams. Let your health care provider know about any changes, no matter how small.  If you are in your 20s or 30s, you should have a clinical breast exam (CBE) by a health care provider every 1-3 years as part of a regular health exam.  If you are 10 or older, have a CBE every year. Also consider having a breast X-ray (mammogram) every year.  If you have a family history of  breast cancer, talk to your health care provider about genetic screening.  If you are at high risk for breast cancer, talk to your health care provider about having an MRI and a mammogram every year.  Breast cancer gene (BRCA) assessment is recommended for women who have family members with BRCA-related cancers. BRCA-related cancers include:  Breast.  Ovarian.  Tubal.  Peritoneal cancers.  Results of the assessment will determine the need for genetic counseling and BRCA1 and BRCA2 testing. Cervical Cancer  Your health care provider may recommend that you be screened regularly for cancer of the pelvic organs (ovaries, uterus, and vagina). This screening involves a pelvic examination, including checking for microscopic changes to the surface of your cervix (Pap test). You may be encouraged to have this screening done every 3 years, beginning at age 45.  For women ages 73-65, health care providers may recommend pelvic exams and Pap testing every 3 years, or they may recommend the Pap and pelvic exam, combined with testing for human papilloma virus (HPV), every 5 years. Some types of HPV increase your risk of cervical cancer. Testing for HPV may also be done on women of any age with unclear Pap test results.  Other health care providers may not recommend any screening for nonpregnant women who are considered low risk for pelvic cancer and who do not have symptoms. Ask your health care provider if a screening pelvic exam is right for you.  If you have had past treatment for cervical cancer or a condition that could lead to cancer, you need Pap tests and screening for cancer for at least 20 years after your treatment. If Pap tests have been discontinued, your risk factors (such as having a new sexual partner) need to be reassessed to determine if screening should resume. Some women have medical problems that increase the chance of getting cervical cancer. In these cases, your health care provider may  recommend more frequent  screening and Pap tests. Colorectal Cancer  This type of cancer can be detected and often prevented.  Routine colorectal cancer screening usually begins at 73 years of age and continues through 73 years of age.  Your health care provider may recommend screening at an earlier age if you have risk factors for colon cancer.  Your health care provider may also recommend using home test kits to check for hidden blood in the stool.  A small camera at the end of a tube can be used to examine your colon directly (sigmoidoscopy or colonoscopy). This is done to check for the earliest forms of colorectal cancer.  Routine screening usually begins at age 50.  Direct examination of the colon should be repeated every 5-10 years through 73 years of age. However, you may need to be screened more often if early forms of precancerous polyps or small growths are found. Skin Cancer  Check your skin from head to toe regularly.  Tell your health care provider about any new moles or changes in moles, especially if there is a change in a mole's shape or color.  Also tell your health care provider if you have a mole that is larger than the size of a pencil eraser.  Always use sunscreen. Apply sunscreen liberally and repeatedly throughout the day.  Protect yourself by wearing long sleeves, pants, a wide-brimmed hat, and sunglasses whenever you are outside. Heart disease, diabetes, and high blood pressure  High blood pressure causes heart disease and increases the risk of stroke. High blood pressure is more likely to develop in:  People who have blood pressure in the high end of the normal range (130-139/85-89 mm Hg).  People who are overweight or obese.  People who are African American.  If you are 18-39 years of age, have your blood pressure checked every 3-5 years. If you are 40 years of age or older, have your blood pressure checked every year. You should have your blood pressure  measured twice-once when you are at a hospital or clinic, and once when you are not at a hospital or clinic. Record the average of the two measurements. To check your blood pressure when you are not at a hospital or clinic, you can use:  An automated blood pressure machine at a pharmacy.  A home blood pressure monitor.  If you are between 55 years and 79 years old, ask your health care provider if you should take aspirin to prevent strokes.  Have regular diabetes screenings. This involves taking a blood sample to check your fasting blood sugar level.  If you are at a normal weight and have a low risk for diabetes, have this test once every three years after 73 years of age.  If you are overweight and have a high risk for diabetes, consider being tested at a younger age or more often. Preventing infection Hepatitis B  If you have a higher risk for hepatitis B, you should be screened for this virus. You are considered at high risk for hepatitis B if:  You were born in a country where hepatitis B is common. Ask your health care provider which countries are considered high risk.  Your parents were born in a high-risk country, and you have not been immunized against hepatitis B (hepatitis B vaccine).  You have HIV or AIDS.  You use needles to inject street drugs.  You live with someone who has hepatitis B.  You have had sex with someone who has hepatitis B.    You get hemodialysis treatment.  You take certain medicines for conditions, including cancer, organ transplantation, and autoimmune conditions. Hepatitis C  Blood testing is recommended for:  Everyone born from [birth year] through 1967.  Anyone with known risk factors for hepatitis C. Sexually transmitted infections (STIs)  You should be screened for sexually transmitted infections (STIs) including gonorrhea and chlamydia if:  You are sexually active and are younger than 73 years of age.  You are older than 73 years of age and  your health care provider tells you that you are at risk for this type of infection.  Your sexual activity has changed since you were last screened and you are at an increased risk for chlamydia or gonorrhea. Ask your health care provider if you are at risk.  If you do not have HIV, but are at risk, it may be recommended that you take a prescription medicine daily to prevent HIV infection. This is called pre-exposure prophylaxis (PrEP). You are considered at risk if:  You are sexually active and do not regularly use condoms or know the HIV status of your partner(s).  You take drugs by injection.  You are sexually active with a partner who has HIV. Talk with your health care provider about whether you are at high risk of being infected with HIV. If you choose to begin PrEP, you should first be tested for HIV. You should then be tested every 3 months for as long as you are taking PrEP. Pregnancy  If you are premenopausal and you may become pregnant, ask your health care provider about preconception counseling.  If you may become pregnant, take 400 to 800 micrograms (mcg) of folic acid every day.  If you want to prevent pregnancy, talk to your health care provider about birth control (contraception). Osteoporosis and menopause  Osteoporosis is a disease in which the bones lose minerals and strength with aging. This can result in serious bone fractures. Your risk for osteoporosis can be identified using a bone density scan.  If you are 5 years of age or older, or if you are at risk for osteoporosis and fractures, ask your health care provider if you should be screened.  Ask your health care provider whether you should take a calcium or vitamin D supplement to lower your risk for osteoporosis.  Menopause may have certain physical symptoms and risks.  Hormone replacement therapy may reduce some of these symptoms and risks. Talk to your health care provider about whether hormone replacement  therapy is right for you. Follow these instructions at home:  Schedule regular health, dental, and eye exams.  Stay current with your immunizations.  Do not use any tobacco products including cigarettes, chewing tobacco, or electronic cigarettes.  If you are pregnant, do not drink alcohol.  If you are breastfeeding, limit how much and how often you drink alcohol.  Limit alcohol intake to no more than 1 drink per day for nonpregnant women. One drink equals 12 ounces of beer, 5 ounces of wine, or 1 ounces of hard liquor.  Do not use street drugs.  Do not share needles.  Ask your health care provider for help if you need support or information about quitting drugs.  Tell your health care provider if you often feel depressed.  Tell your health care provider if you have ever been abused or do not feel safe at home. This information is not intended to replace advice given to you by your health care provider. Make sure you discuss any  questions you have with your health care provider. Document Released: 03/06/2011 Document Revised: 01/27/2016 Document Reviewed: 05/25/2015  2017 [employer]

## 2016-09-25 NOTE — Progress Notes (Signed)
I have reviewed this visit and discussed with Howell Rucks, RN, BSN, and agree with her documentation.    Notably patient has not followed up with me nor anyone else at [facility] for her blood pressure as was recommended. I've never actually met patient before. Her prior PCP was Dr. Mingo Amber, whom she last saw in February 2017.  I will hold on placing a referral to a new neurologist as she has an appointment in 2 days to establish care with a new practice.  Leeanne Rio, MD

## 2016-09-27 ENCOUNTER — Ambulatory Visit: Payer: Medicare Other | Admitting: Adult Health

## 2016-10-03 ENCOUNTER — Other Ambulatory Visit: Payer: Self-pay | Admitting: *Deleted

## 2016-10-04 NOTE — Telephone Encounter (Signed)
Pt will be out of this medication tomorrow, husband would like to get it refilled today. ep

## 2016-10-05 MED ORDER — LEVOTHYROXINE SODIUM 100 MCG PO TABS: ORAL_TABLET | ORAL | 0 refills | Status: DC

## 2016-10-18 ENCOUNTER — Ambulatory Visit: Payer: Medicare Other | Admitting: Adult Health

## 2016-10-23 ENCOUNTER — Ambulatory Visit (INDEPENDENT_AMBULATORY_CARE_PROVIDER_SITE_OTHER): Payer: Medicare Other | Admitting: Adult Health

## 2016-10-23 ENCOUNTER — Encounter: Payer: Self-pay | Admitting: Adult Health

## 2016-10-23 VITALS — BP 82/58 | HR 77 | Ht 65.0 in | Wt 211.5 lb

## 2016-10-23 DIAGNOSIS — G43911 Migraine, unspecified, intractable, with status migrainosus: Secondary | ICD-10-CM

## 2016-10-23 DIAGNOSIS — K580 Irritable bowel syndrome with diarrhea: Secondary | ICD-10-CM

## 2016-10-23 DIAGNOSIS — R413 Other amnesia: Secondary | ICD-10-CM

## 2016-10-23 DIAGNOSIS — M159 Polyosteoarthritis, unspecified: Secondary | ICD-10-CM | POA: Diagnosis not present

## 2016-10-23 DIAGNOSIS — E669 Obesity, unspecified: Secondary | ICD-10-CM

## 2016-10-23 DIAGNOSIS — R269 Unspecified abnormalities of gait and mobility: Secondary | ICD-10-CM | POA: Diagnosis not present

## 2016-10-23 DIAGNOSIS — Z0000 Encounter for general adult medical examination without abnormal findings: Secondary | ICD-10-CM

## 2016-10-23 DIAGNOSIS — M797 Fibromyalgia: Secondary | ICD-10-CM

## 2016-10-23 NOTE — Assessment & Plan Note (Signed)
Healthy eating discussed.  Reduced saturated fat intake and safely increase regular movement (i.e. Stationary bike).

## 2016-10-23 NOTE — Assessment & Plan Note (Signed)
Regular f/u every 3 months at PCP. Continue with specialists as directed.

## 2016-10-23 NOTE — Assessment & Plan Note (Signed)
Neurology consult placed

## 2016-10-23 NOTE — Assessment & Plan Note (Signed)
Continue pain management per Dr. Suella Broad.  Reviewed DEA Registry information.  Discussed how this clinic will not provide additional narcotics.

## 2016-10-23 NOTE — Progress Notes (Signed)
Subjective:    Patient ID: [name], female    DOB: 1946/07/12, 71 y.o.   MRN: [id number]  HPI:  [name] presents to establish as new pt.  She has extensive medical hx of depression, panic attakcs, migraine HA, seizures, memory loss, gait imbalance, fibromyalgia, obesity, chronic pain, and opioid dependence. Since her "drug-induced seizure about 8-10 years ago" her memory has been severely impaired and she quite a poor historian, husband at [facility] provides majority of information.  She denies CP/dyspnea at rest.  She has hx of hypotension, however denies dizziness or syncopal episodes.  She has had "explosive diarrhea a few days a week" and urinary incontinence both intermittently occurring "for years".  She feels that dairy trigger the IBS sx's.  She was seen by GI for routine colonoscopy 2014 and no follow-up since.  She has psychiatry appt this week.  She was visiting neurology annually, however sadly Dr. Thana Ates unexpectedly passed away.  She was seen by Dr. Monico Hoar once and does not wish to return to that specific practice and requests new referral. Her chronic back pain (5/10 on most days) is managed Oxycodone 15 mg 4-5 times daily and she visits Dr. Suella Broad monthly.  She reports a "decent diet" of fruits, vegetables, and meat.  She does not drink water, instead drinks a sugar/caffeine free soda, about 6/day.  She denies regular exercise and formal PT.    Patient Care Team    Relationship Specialty Notifications Start End  [name], NP PCP - General Family Medicine  10/23/16   Ileene Patrick, LCSW Social Worker Licensed Clinical Social Worker  08/11/13   Chucky May, MD Consulting Physician Psychiatry  09/12/16   Michela Pitcher. Laurance Flatten, MD Physician Assistant Sports Medicine  09/12/16   Margaretmary Lombard, PhD Referring Physician Psychology  09/12/16     Patient Active Problem List   Diagnosis Date Noted  . Paronychia of finger of right hand 08/22/2016  . Right knee pain  10/14/2015  . Actinic keratoses 07/02/2015  . Injury of right hand 04/27/2014  . Chest wall tenderness 04/27/2014  . Traumatic injury of lower extremity 03/25/2014  . Obesity (BMI 30.0-34.9) 01/30/2014  . Health care maintenance 01/30/2014  . 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  . Diastolic CHF ([place]) 0000000  . Fibromyalgia 08/14/2012  . GERD with stricture 03/04/2012  . Leg edema 03/04/2012  . CATARACT, LEFT EYE 08/23/2010  . Abnormality of gait 08/23/2010  . Osteoarthritis 02/24/2010  . ANEMIA, IRON DEFICIENCY 03/06/2009  . 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 SYMPTOM, INCONTINENCE W/O SENSORY AWARENESS 12/26/2006  . Memory loss 12/26/2006  . Hypothyroidism 11/01/2006  . DEPRESSION, MAJOR, RECURRENT 11/01/2006  . PANIC ATTACKS 11/01/2006  . Migraine headache 11/01/2006  . Carpal tunnel syndrome 11/01/2006  . 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 Irritable bowel syndrome 11/01/2006  . OSTEOPENIA 11/01/2006     Past Medical History:  Diagnosis Date  . Anemia    took Fe- orally 2 yrs. ago   . Anxiety    panic attacks   . Arthritis    shoulders, knees  . Cancer ([place])    skin- preCA  . Depression   . GERD (gastroesophageal reflux disease)    uses Protonix as needed   . Headache(784.0)   . Humerus fracture    Right   . Hyposmolality and/or hyponatremia 02/27/2013  . Hypothyroidism   . IBS (irritable bowel syndrome)   . Mental disorder   . Preoperative examination 02/26/2013  .  Seizures ([place])    caused by depression , seritonin seizure - effected short term memory    . Shortness of breath      Past Surgical History:  Procedure Laterality Date  . ABDOMINAL HYSTERECTOMY    . APPENDECTOMY    . BACK SURGERY  1990's  . BREAST SURGERY  1972   augmentation   . CHOLECYSTECTOMY    . GASTRECTOMY  1994   Due to ulcer, required multiple revisions.   Marland Kitchen HERNIA REPAIR    . ORIF ANKLE FRACTURE Right 2004  . REPLACEMENT TOTAL KNEE    . REVERSE SHOULDER  ARTHROPLASTY Right 05/16/2013   Procedure: RIGHT HEMI ARTHROPLASTY  VERSES  REVERSE TOTAL SHOULDER ARTHROPLASTY ;  Surgeon: Augustin Schooling, MD;  Location: [place];  Service: Orthopedics;  Laterality: Right;  . SPINE SURGERY  2005   Lumbar spinal stenosis   . STOMACH SURGERY     1/3 resection for obstruction  . TONSILLECTOMY    . TUBAL LIGATION       Family History  Problem Relation Age of Onset  . Aneurysm Mother   . Heart disease Mother   . Cancer Father     pancreatic  . Stroke Father 73  . Hypertension Father   . Alcohol abuse Father   . Heart disease Maternal Grandfather      History  Drug Use No     History  Alcohol Use No    Comment: rare use of white wine      History  Smoking Status  . Never Smoker  Smokeless Tobacco  . Never Used     Outpatient Encounter Prescriptions as of 10/23/2016  Medication Sig  . aspirin 500 MG EC tablet Take 500 mg by mouth daily as needed for pain.  . Cholecalciferol (VITAMIN D-3) 1000 units CAPS Take 1,000 Units by mouth.  . clonazePAM (KLONOPIN) 1 MG tablet Take 1 mg by mouth 2 (two) times daily. Per Dr. Toy Care  . Coenzyme Q10 (CO Q-10) 300 MG CAPS Take 1 capsule by mouth daily.    . fluticasone (FLONASE) 50 MCG/ACT nasal spray Place 2 sprays into the nose daily. (Patient taking differently: Place 2 sprays into the nose every evening. )  . frovatriptan (FROVA) 2.5 MG tablet Take 2.5 mg by mouth as needed. If recurs, may repeat after 2 hours. Max of 3 tabs in 24 hours.  Per Dr. Thana Ates   . furosemide (LASIX) 20 MG tablet Take 1 tab daily only when needed for lower extremity swelling or increase in weight (Patient taking differently: Take 20 mg by mouth daily as needed for fluid. Take 1 tab daily only when needed for lower extremity swelling or increase in weight)  . L-Methylfolate-Algae (DEPLIN 15 PO) Take 1 tablet by mouth daily.  . Multiple Vitamins-Minerals (CENTRUM SILVER PO) Take 1 tablet by mouth daily.   . ondansetron  (ZOFRAN) 4 MG tablet Take 4 mg by mouth every 8 (eight) hours as needed. for migraine per Dr. Collene Mares  . oxyCODONE (ROXICODONE) 15 MG immediate release tablet Take 1 tablet (15 mg total) by mouth every 4 (four) hours as needed for pain.  . pregabalin (LYRICA) 150 MG capsule Take 150 mg by mouth 3 (three) times daily.  . QUEtiapine (SEROQUEL) 50 MG tablet Take 50 mg by mouth at bedtime.    . S-Adenosylmethionine (MOOD PLUS SAM-E DOUBLE ST) 400 MG TBEC Take 1 tablet by mouth daily.  . S-Adenosylmethionine (SAM-E) 200 MG TABS Take 2 tablets by  mouth daily. Per patient  . tiZANidine (ZANAFLEX) 4 MG tablet Take 2-4 mg by mouth 2 (two) times daily.  . Turmeric (RA TURMERIC) 500 MG CAPS Take 500 mg by mouth 2 (two) times daily.  Marland Kitchen vortioxetine HBr (TRINTELLIX) 20 MG TABS Take 20 mg by mouth daily.  . [DISCONTINUED] levothyroxine (SYNTHROID, LEVOTHROID) 100 MCG tablet TAKE 1 TABLET BY MOUTH DAILY BEFORE BREAKFAST  . [DISCONTINUED] ramelteon (ROZEREM) 8 MG tablet Take 8 mg by mouth at bedtime. Per Dr. Toy Care    No facility-administered encounter medications on file as of 10/23/2016.     Allergies: Bupropion hcl; Dairy aid [lactase]; Azithromycin; and Erythromycin  Body mass index is 35.2 kg/m.  Blood pressure (!) 82/58, pulse 77, height 5\' 5"  (1.651 m), weight 211 lb 8 oz (95.9 kg).    Review of Systems  Constitutional: Negative for activity change, appetite change, chills, diaphoresis, fatigue, fever and unexpected weight change.  HENT: Positive for sinus pressure. Negative for congestion, sore throat, trouble swallowing and voice change.   Eyes: Negative for visual disturbance.  Respiratory: Negative for cough, shortness of breath, wheezing and stridor.   Cardiovascular: Positive for leg swelling. Negative for chest pain and palpitations.  Gastrointestinal: Positive for diarrhea. Negative for abdominal distention, abdominal pain, blood in stool, nausea and vomiting.  Endocrine: Negative for cold  intolerance, heat intolerance, polydipsia, polyphagia and polyuria.  Genitourinary: Positive for urgency. Negative for decreased urine volume, difficulty urinating, flank pain, vaginal bleeding, vaginal discharge and vaginal pain.  Musculoskeletal: Positive for arthralgias, back pain, gait problem, joint swelling and myalgias.  Skin: Negative for color change, pallor, rash and wound.  Neurological: Positive for seizures and headaches. Negative for dizziness and light-headedness.       Last seizure was 8-10 years ago.  Pt/Pt's husband states that it was "medication induced".  Psychiatric/Behavioral: Positive for confusion, decreased concentration and sleep disturbance. Negative for self-injury and suicidal ideas. The patient is nervous/anxious. The patient is not hyperactive.        Objective:   Physical Exam  Constitutional: She appears well-developed and well-nourished. No distress.  HENT:  Head: Normocephalic and atraumatic.  Right Ear: External ear normal.  Left Ear: External ear normal.  Eyes: Conjunctivae and EOM are normal. Pupils are equal, round, and reactive to light.  Neck: Normal range of motion. Neck supple.  Cardiovascular: Normal rate, regular rhythm, normal heart sounds and intact distal pulses.   Pulmonary/Chest: Effort normal and breath sounds normal.  Abdominal: Soft. Bowel sounds are normal. She exhibits no distension. There is no tenderness.  Musculoskeletal: She exhibits edema and tenderness.       Right ankle: She exhibits normal pulse.       Left ankle: She exhibits normal pulse.       Thoracic back: She exhibits tenderness and pain.       Lumbar back: She exhibits tenderness and pain.  Lymphadenopathy:    She has no cervical adenopathy.  Neurological: She is alert. She is disoriented. She displays normal reflexes. She exhibits normal muscle tone.  Unable to state year correctly  Skin: Skin is warm and dry. No rash noted. She is not diaphoretic. No erythema. No  pallor.  Psychiatric: She has a normal mood and affect. Her behavior is normal. Judgment normal.  Nursing note and vitals reviewed.         Assessment & Plan:   1. Intractable migraine with status migrainosus, unspecified migraine type   2. Irritable bowel syndrome with diarrhea  3. Osteoarthritis of multiple joints, unspecified osteoarthritis type   4. Memory loss   5. Abnormality of gait   6. Fibromyalgia   7. Obesity (BMI 30.0-34.9)   8. Health care maintenance     Migraine headache Continue Tizanidine as directed.  Neurology referral placed.  Irritable bowel syndrome IBS eating guide provided.  GI referral placed.  Osteoarthritis Continue pain management per Dr. Suella Broad.  Reviewed DEA Registry information.  Discussed how this clinic will not provide additional narcotics.  Memory loss Neurology consult placed.  Abnormality of gait Continue use of ambulation aids. Discussed low impact exercise (stationary bike, seating weight lifting) to maintain strength.  Fibromyalgia Neurology referral placed.  Obesity (BMI 30.0-34.9) Healthy eating discussed.  Reduced saturated fat intake and safely increase regular movement (i.e. Stationary bike).  Health care maintenance Regular f/u every 3 months at PCP. Continue with specialists as directed.     FOLLOW-UP:  Return in about 3 months (around 01/20/2017) for Regular Follow Up.

## 2016-10-23 NOTE — Assessment & Plan Note (Signed)
IBS eating guide provided.  GI referral placed.

## 2016-10-23 NOTE — Patient Instructions (Signed)
Migraine Headache A migraine headache is an intense, throbbing pain on one side or both sides of the head. Migraines may also cause other symptoms, such as nausea, vomiting, and sensitivity to light and noise. What are the causes? Doing or taking certain things may also trigger migraines, such as:  Alcohol.  Smoking.  Medicines, such as:  Medicine used to treat chest pain (nitroglycerine).  Birth control pills.  Estrogen pills.  Certain blood pressure medicines.  Aged cheeses, chocolate, or caffeine.  Foods or drinks that contain nitrates, glutamate, aspartame, or tyramine.  Physical activity. Other things that may trigger a migraine include:  Menstruation.  Pregnancy.  Hunger.  Stress, lack of sleep, too much sleep, or fatigue.  Weather changes. What increases the risk? The following factors may make you more likely to experience migraine headaches:  Age. Risk increases with age.  Family history of migraine headaches.  Being Caucasian.  Depression and anxiety.  Obesity.  Being a woman.  Having a hole in the heart (patent foramen ovale) or other heart problems. What are the signs or symptoms? The main symptom of this condition is pulsating or throbbing pain. Pain may:  Happen in any area of the head, such as on one side or both sides.  Interfere with daily activities.  Get worse with physical activity.  Get worse with exposure to bright lights or loud noises. Other symptoms may include:  Nausea.  Vomiting.  Dizziness.  General sensitivity to bright lights, loud noises, or smells. Before you get a migraine, you may get warning signs that a migraine is developing (aura). An aura may include:  Seeing flashing lights or having blind spots.  Seeing bright spots, halos, or zigzag lines.  Having tunnel vision or blurred vision.  Having numbness or a tingling feeling.  Having trouble talking.  Having muscle weakness. How is this diagnosed? A  migraine headache can be diagnosed based on:  Your symptoms.  A physical exam.  Tests, such as CT scan or MRI of the head. These imaging tests can help rule out other causes of headaches.  Taking fluid from the spine (lumbar puncture) and analyzing it (cerebrospinal fluid analysis, or CSF analysis). How is this treated? A migraine headache is usually treated with medicines that:  Relieve pain.  Relieve nausea.  Prevent migraines from coming back. Treatment may also include:  Acupuncture.  Lifestyle changes like avoiding foods that trigger migraines. Follow these instructions at home: Medicines  Take over-the-counter and prescription medicines only as told by your health care provider.  Do not drive or use heavy machinery while taking prescription pain medicine.  To prevent or treat constipation while you are taking prescription pain medicine, your health care provider may recommend that you:  Drink enough fluid to keep your urine clear or pale yellow.  Take over-the-counter or prescription medicines.  Eat foods that are high in fiber, such as fresh fruits and vegetables, whole grains, and beans.  Limit foods that are high in fat and processed sugars, such as fried and sweet foods. Lifestyle  Avoid alcohol use.  Do not use any products that contain nicotine or tobacco, such as cigarettes and e-cigarettes. If you need help quitting, ask your health care provider.  Get at least 8 hours of sleep every night.  Limit your stress. General instructions  Keep a journal to find out what may trigger your migraine headaches. For example, write down:  What you eat and drink.  How much sleep you get.  Any change  to your diet or medicines.  If you have a migraine:  Avoid things that make your symptoms worse, such as bright lights.  It may help to lie down in a dark, quiet room.  Do not drive or use heavy machinery.  Ask your health care provider what activities are  safe for you while you are experiencing symptoms.  Keep all follow-up visits as told by your health care provider. This is important. Contact a health care provider if:  You develop symptoms that are different or more severe than your usual migraine symptoms. Get help right away if:  Your migraine becomes severe.  You have a fever.  You have a stiff neck.  You have vision loss.  Your muscles feel weak or like you cannot control them.  You start to lose your balance often.  You develop trouble walking.  You faint. This information is not intended to replace advice given to you by your health care provider. Make sure you discuss any questions you have with your health care provider. Document Released: 08/21/2005 Document Revised: 03/10/2016 Document Reviewed: 02/07/2016 Elsevier Interactive Patient Education  2017 [place]. Diet for Irritable Bowel Syndrome Introduction When you have irritable bowel syndrome (IBS), the foods you eat and your eating habits are very important. IBS may cause various symptoms, such as abdominal pain, constipation, or diarrhea. Choosing the right foods can help ease discomfort caused by these symptoms. Work with your health care provider and dietitian to find the best eating plan to help control your symptoms. What general guidelines do I need to follow?  Keep a food diary. This will help you identify foods that cause symptoms. Write down:  What you eat and when.  What symptoms you have.  When symptoms occur in relation to your meals.  Avoid foods that cause symptoms. Talk with your dietitian about other ways to get the same nutrients that are in these foods.  Eat more foods that contain fiber. Take a fiber supplement if directed by your dietitian.  Eat your meals slowly, in a relaxed setting.  Aim to eat 5-6 small meals per day. Do not skip meals.  Drink enough fluids to keep your urine clear or pale yellow.  Ask your health care  provider if you should take an over-the-counter probiotic during flare-ups to help restore healthy gut bacteria.  If you have cramping or diarrhea, try making your meals low in fat and high in carbohydrates. Examples of carbohydrates are pasta, rice, whole grain breads and cereals, fruits, and vegetables.  If dairy products cause your symptoms to flare up, try eating less of them. You might be able to handle yogurt better than other dairy products because it contains bacteria that help with digestion. What foods are not recommended? The following are some foods and drinks that may worsen your symptoms:  Fatty foods, such as [place] fries.  Milk products, such as cheese or ice cream.  Chocolate.  Alcohol.  Products with caffeine, such as coffee.  Carbonated drinks, such as soda. The items listed above may not be a complete list of foods and beverages to avoid. Contact your dietitian for more information.  What foods are good sources of fiber? Your health care provider or dietitian may recommend that you eat more foods that contain fiber. Fiber can help reduce constipation and other IBS symptoms. Add foods with fiber to your diet a little at a time so that your body can get used to them. Too much fiber at once  might cause gas and swelling of your abdomen. The following are some foods that are good sources of fiber:  Apples.  Peaches.  Pears.  Berries.  Figs.  Broccoli (raw).  Cabbage.  Carrots.  Raw peas.  Kidney beans.  Lima beans.  Whole grain bread.  Whole grain cereal. Where to find more information: [name] for Functional Gastrointestinal Disorders: www.iffgd.[employer] of Diabetes and Digestive and Kidney Diseases: NetworkAff[employer].co.za.aspx This information is not intended to replace advice given to you by your health care provider. Make sure you discuss any questions  you have with your health care provider. Document Released: 11/11/2003 Document Revised: 01/27/2016 Document Reviewed: 11/21/2013  2017 Elsevier Urinary Incontinence Urinary incontinence is the involuntary loss of urine from your bladder. CAUSES  There are many causes of urinary incontinence. They include:  Medicines.  Infections.  Prostatic enlargement, leading to overflow of urine from your bladder.  Surgery.  Neurological diseases.  Emotional factors. SIGNS AND SYMPTOMS Urinary Incontinence can be divided into four types: 1. Urge incontinence. Urge incontinence is the involuntary loss of urine before you have the opportunity to go to the bathroom. There is a sudden urge to void but not enough time to reach a bathroom. 2. Stress incontinence. Stress incontinence is the sudden loss of urine with any activity that forces urine to pass. It is commonly caused by anatomical changes to the pelvis and sphincter areas of your body. 3. Overflow incontinence. Overflow incontinence is the loss of urine from an obstructed opening to your bladder. This results in a backup of urine and a resultant buildup of pressure within the bladder. When the pressure within the bladder exceeds the closing pressure of the sphincter, the urine overflows, which causes incontinence, similar to water overflowing a dam. 4. Total incontinence. Total incontinence is the loss of urine as a result of the inability to store urine within your bladder. DIAGNOSIS  Evaluating the cause of incontinence may require:  A thorough and complete medical and obstetric history.  A complete physical exam.  Laboratory tests such as a urine culture and sensitivities. When additional tests are indicated, they can include:  An ultrasound exam.  Kidney and bladder X-rays.  Cystoscopy. This is an exam of the bladder using a narrow scope.  Urodynamic testing to test the nerve function to the bladder and sphincter areas. TREATMENT   Treatment for urinary incontinence depends on the cause:  For urge incontinence caused by a bacterial infection, antibiotics will be prescribed. If the urge incontinence is related to medicines you take, your health care provider may have you change the medicine.  For stress incontinence, surgery to re-establish anatomical support to the bladder or sphincter, or both, will often correct the condition.  For overflow incontinence caused by an enlarged prostate, an operation to open the channel through the enlarged prostate will allow the flow of urine out of the bladder. In women with fibroids, a hysterectomy may be recommended.  For total incontinence, surgery on your urinary sphincter may help. An artificial urinary sphincter (an inflatable cuff placed around the urethra) may be required. In women who have developed a hole-like passage between their bladder and vagina (vesicovaginal fistula), surgery to close the fistula often is required. HOME CARE INSTRUCTIONS  Normal daily hygiene and the use of pads or adult diapers that are changed regularly will help prevent odors and skin damage.  Avoid caffeine. It can overstimulate your bladder.  Use the bathroom regularly. Try about every 2-3 hours to go  to the bathroom, even if you do not feel the need to do so. Take time to empty your bladder completely. After urinating, wait a minute. Then try to urinate again.  For causes involving nerve dysfunction, keep a log of the medicines you take and a journal of the times you go to the bathroom. SEEK MEDICAL CARE IF:  You experience worsening of pain instead of improvement in pain after your procedure.  Your incontinence becomes worse instead of better. SEE IMMEDIATE MEDICAL CARE IF:  You experience fever or shaking chills.  You are unable to pass your urine.  You have redness spreading into your groin or down into your thighs. MAKE SURE YOU:  ?Understand these instructions.   Will watch  your condition.  Will get help right away if you are not doing well or get worse. This information is not intended to replace advice given to you by your health care provider. Make sure you discuss any questions you have with your health care provider. Document Released: 09/28/2004 Document Revised: 09/11/2014 Document Reviewed: 01/28/2013 Elsevier Interactive Patient Education  2017 [place] all medications as directed. Referral for GI specialist and Neurologist placed. Please return in 3 months, or sooner if needed.

## 2016-10-23 NOTE — Assessment & Plan Note (Signed)
Continue Tizanidine as directed.  Neurology referral placed.

## 2016-10-23 NOTE — Assessment & Plan Note (Signed)
Neurology referral placed

## 2016-10-23 NOTE — Assessment & Plan Note (Signed)
Continue use of ambulation aids. Discussed low impact exercise (stationary bike, seating weight lifting) to maintain strength.

## 2016-10-25 ENCOUNTER — Ambulatory Visit: Payer: Medicare Other | Admitting: Neurology

## 2016-11-01 ENCOUNTER — Telehealth: Payer: Self-pay | Admitting: Adult Health

## 2016-11-01 NOTE — Telephone Encounter (Signed)
Pt's husband cld states she is out of thyroid medicine Synthroid(sp)-- it is not listed in her medication but spouse said was prescribed by prior provider-- Pt request refill be called into pharmacy. --glh

## 2016-11-02 ENCOUNTER — Other Ambulatory Visit: Payer: Self-pay | Admitting: Adult Health

## 2016-11-02 DIAGNOSIS — E039 Hypothyroidism, unspecified: Secondary | ICD-10-CM

## 2016-11-02 MED ORDER — LEVOTHYROXINE SODIUM 100 MCG PO TABS
100.0000 ug | ORAL_TABLET | Freq: Every day | ORAL | 0 refills | Status: DC
Start: 2016-11-02 — End: 2016-12-04

## 2016-11-02 NOTE — Telephone Encounter (Signed)
Per notes from previous PCP, pt is taking Synthroid 149mcg QD.  We have not prescribed this medication for the patient before.  Please approve if appropriate.  Charyl Bigger, CMA

## 2016-11-02 NOTE — Telephone Encounter (Signed)
Good Afternoon [name], Can you please call [name] and share that I sent in 30 day supply of Levothyroxine 135mcg daily. She needs to come in for full fasting labs ASAP! Can you please put those orders in? Thanks! Valetta Fuller

## 2016-11-02 NOTE — Telephone Encounter (Signed)
LVM for pt's spouse to return call.  Charyl Bigger, CMA

## 2016-11-02 NOTE — Telephone Encounter (Signed)
Pt's spouse informed of need for fasting labs.  Spouse expressed understanding and will call back to schedule lab appointment.  Charyl Bigger, CMA

## 2016-11-02 NOTE — Progress Notes (Unsigned)
Needs full set of fastin labs ASAP.

## 2016-11-06 ENCOUNTER — Encounter: Payer: Self-pay | Admitting: Neurology

## 2016-11-06 ENCOUNTER — Ambulatory Visit (INDEPENDENT_AMBULATORY_CARE_PROVIDER_SITE_OTHER): Payer: Medicare Other | Admitting: Neurology

## 2016-11-06 VITALS — BP 99/69 | HR 85 | Ht 65.0 in | Wt 208.0 lb

## 2016-11-06 DIAGNOSIS — G43911 Migraine, unspecified, intractable, with status migrainosus: Secondary | ICD-10-CM

## 2016-11-06 DIAGNOSIS — R413 Other amnesia: Secondary | ICD-10-CM

## 2016-11-06 DIAGNOSIS — R269 Unspecified abnormalities of gait and mobility: Secondary | ICD-10-CM | POA: Diagnosis not present

## 2016-11-06 MED ORDER — DICLOFENAC POTASSIUM(MIGRAINE) 50 MG PO PACK: 50.0000 mg | PACK | ORAL | 11 refills | Status: DC | PRN

## 2016-11-06 NOTE — Progress Notes (Signed)
PATIENT: [name] DOB: 09-Sep-1945  Chief Complaint  Patient presents with  . Migraine    She is here with her husband, [name].  She typically gets 2-3 migraines each month.  She tends to get relief with Frova, if she is also able to lay down to rest.  She has also used oxycodone in the past.  . Memory Loss    MMSE - animals. She tends to have problems with her short-term memory.  Feels this started after having a seizure from increased serotonin levels 18 years ago.  [name] PCP    [name], NP     HISTORICAL  [name] is a 71 years old right-handed female, accompanied by her husband [name],  seen in refer by her primary care physician nurse practitioner [name] for evaluation of migraine, memory loss, initial evaluation was November 06 2016.  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  She reported lifelong history of migraine headaches, her typical migraine are right side severe pounding headache with associated light noise sensitivity, nauseous, lasting for 3 days, she was previously treated by neurologist at [facility] with Lyrica 400 mg daily, tizanidine Frova as needed, oxycodone 20 mg 4 times a day,  Her physician changed at the beginning of the year, she was tapered off oxycodone, since then, she complained of frequent  headaches, about once a week severe right side migraine headache, lasting for 3 days, has to go to emergency room,  She has chronic gait abnormality due to right knee pain, she had right knee replacement, left knee severe arthritis disease.   REVIEW OF SYSTEMS: Full 14 system review of systems performed and notable only for as above  ALLERGIES: Allergies  Allergen Reactions  . Bupropion Hcl     REACTION: seizure  . Dairy Aid [Lactase] Diarrhea  . Azithromycin Rash    REACTION: rash  . Erythromycin Rash    REACTION: rash    HOME MEDICATIONS: Current Outpatient Prescriptions  Medication Sig Dispense Refill  . aspirin 500 MG EC tablet Take 500 mg by mouth daily as needed for pain.    . Cholecalciferol (VITAMIN D-3) 1000 units CAPS Take 1,000 Units by mouth.    . clonazePAM (KLONOPIN) 1 MG tablet Take 1 mg by mouth 2 (two) times daily. Per Dr. Toy Care    . Coenzyme Q10 (CO Q-10) 300 MG CAPS Take 1 capsule by mouth daily.      . fluticasone (FLONASE) 50 MCG/ACT nasal spray Place 2 sprays into the nose daily. (Patient taking differently: Place 2 sprays into the nose every evening. ) 16 g 5  . frovatriptan (FROVA) 2.5 MG tablet Take 2.5 mg by mouth as needed. If recurs, may repeat after 2 hours. Max of 3 tabs in 24 hours.    . furosemide (LASIX) 20 MG tablet Take 1 tab  daily only when needed for lower extremity swelling or increase in weight (Patient taking differently: Take 20 mg by mouth daily as needed for fluid. Take 1 tab daily only when needed for lower extremity swelling or increase in weight) 30 tablet 0  . levothyroxine (SYNTHROID, LEVOTHROID) 100 MCG tablet Take 1 tablet (100 mcg total) by mouth daily. 30 tablet 0  . Multiple Vitamins-Minerals (CENTRUM SILVER PO) Take 1 tablet by mouth daily.     . ondansetron (ZOFRAN) 4 MG tablet Take 4 mg by mouth every 8 (eight) hours as needed. for migraine per Dr. Collene Mares    . oxyCODONE (ROXICODONE) 15 MG immediate release tablet Take 1 tablet (15  mg total) by mouth every 4 (four) hours as needed for pain. 40 tablet 0  . pregabalin (LYRICA) 150 MG capsule Take 150 mg by mouth 3 (three) times daily.    . QUEtiapine (SEROQUEL) 50 MG tablet Take 50 mg by mouth at bedtime.      . S-Adenosylmethionine (MOOD PLUS SAM-E DOUBLE ST) 400 MG TBEC Take 1 tablet by mouth daily.    . S-Adenosylmethionine (SAM-E) 200 MG TABS Take 2 tablets by mouth daily. Per patient    . tiZANidine (ZANAFLEX) 4 MG tablet Take 2-4 mg by mouth 2 (two) times daily.    . Turmeric (RA TURMERIC) 500 MG CAPS Take 500 mg by mouth 2 (two) times daily.    [name] vortioxetine HBr (TRINTELLIX) 20 MG TABS Take 20 mg by mouth daily.     No current facility-administered medications for this visit.     PAST MEDICAL HISTORY: Past Medical History:  Diagnosis Date  . Anemia    took Fe- orally 2 yrs. ago   . Anxiety    panic attacks   . Arthritis    shoulders, knees  . Cancer ([place])    skin- preCA  . Depression   . GERD (gastroesophageal reflux disease)    uses Protonix as needed   . Headache(784.0)   . Humerus fracture    Right   . Hyposmolality and/or hyponatremia 02/27/2013  . Hypothyroidism   . IBS (irritable bowel syndrome)   . Memory loss   . Mental disorder   . Migraine   . Preoperative examination 02/26/2013  . Seizures ([place])    caused by depression , seritonin seizure - effected short term memory    . Shortness of breath     PAST SURGICAL HISTORY: Past Surgical History:  Procedure Laterality Date  . ABDOMINAL HYSTERECTOMY    . APPENDECTOMY    . BACK SURGERY  1990's  . BREAST SURGERY  1972   augmentation   . CHOLECYSTECTOMY    . GASTRECTOMY  1994   Due to ulcer, required multiple revisions.   [name] HERNIA REPAIR    . ORIF ANKLE FRACTURE Right 2004  . REPLACEMENT TOTAL KNEE    . REVERSE SHOULDER ARTHROPLASTY Right 05/16/2013   Procedure: RIGHT HEMI ARTHROPLASTY  VERSES  REVERSE TOTAL SHOULDER ARTHROPLASTY ;  Surgeon: Augustin Schooling, MD;  Location: [facility];   Service: Orthopedics;  Laterality: Right;  . SPINE SURGERY  2005   Lumbar spinal stenosis   . STOMACH SURGERY     1/3 resection for obstruction  . TONSILLECTOMY    . TUBAL LIGATION      FAMILY HISTORY: Family History  Problem Relation Age of Onset  . Aneurysm Mother   . Heart disease Mother   . Cancer Father     pancreatic  . Stroke  Father 69  . Hypertension Father   . Alcohol abuse Father   . Heart disease Maternal Grandfather     SOCIAL HISTORY:  Social History   Social History  . Marital status: Married    Spouse name: N/A  . Number of children: 1  . Years of education: College   Occupational History  . Retired    Social History Main Topics  . Smoking status: Never Smoker  . Smokeless tobacco: Never Used  . Alcohol use No     Comment: rare use of white wine   . Drug use: No  . Sexual activity: Not on file   Other Topics Concern  . Not on file   Social History Narrative   Lives at home with her husband.   2 cups caffeine daily.   Right-handed.     PHYSICAL EXAM   Vitals:   11/06/16 1544  BP: 99/69  Pulse: 85  Weight: 208 lb (94.3 kg)  Height: 5\' 5"  (1.651 m)    Not recorded      Body mass index is 34.61 kg/m.  PHYSICAL EXAMNIATION:  Gen: NAD, conversant, well nourised, obese, well groomed                     Cardiovascular: Regular rate rhythm, no peripheral edema, warm, nontender. Eyes: Conjunctivae clear without exudates or hemorrhage Neck: Supple, no carotid bruits. Pulmonary: Clear to auscultation bilaterally   NEUROLOGICAL EXAM:  MENTAL STATUS: Speech:    Speech is normal; fluent and spontaneous with normal comprehension.  Cognition:Mini-Mental Status Examination 22/30     Orientation: She is not oriented to time and place,     Recent and remote memory: She missed one out of 3 recalls     Normal Attention span and concentration    Language, naming, repeating,spontaneous speech: She has difficulty copy design     Fund of  knowledge   CRANIAL NERVES: CN II: Visual fields are full to confrontation. Fundoscopic exam is normal with sharp discs and no vascular changes. Pupils are round equal and briskly reactive to light. CN III, IV, VI: extraocular movement are normal. No ptosis. CN V: Facial sensation is intact to pinprick in all 3 divisions bilaterally. Corneal responses are intact.  CN VII: Face is symmetric with normal eye closure and smile. CN VIII: Hearing is normal to rubbing fingers CN IX, X: Palate elevates symmetrically. Phonation is normal. CN XI: Head turning and shoulder shrug are intact CN XII: Tongue is midline with normal movements and no atrophy.  MOTOR: There is no pronator drift of out-stretched arms. Muscle bulk and tone are normal. Muscle strength is normal.  REFLEXES: Reflexes are 2+ and symmetric at the biceps, triceps, knees, and ankles. Plantar responses are flexor.  SENSORY: Intact to light touch, pinprick, positional sensation and vibratory sensation are intact in fingers and toes.  COORDINATION: Rapid alternating movements and fine finger movements are intact. There is no dysmetria on finger-to-nose and heel-knee-shin.    GAIT/STANCE: She needs push up to get up from seated position, antalgic, unsteady, allow her walker,  DIAGNOSTIC DATA (LABS, IMAGING, TESTING) - I reviewed patient records, labs, notes, testing and imaging myself where available.   ASSESSMENT AND PLAN  GERALINE KRIBS is a 71 y.o. 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     Marcial Pacas, M.D. Ph.D.  [facility] [address] [place], [place]  [address] Ph: [phone] Fax: [phone]  CC: [name], NP

## 2016-11-07 ENCOUNTER — Telehealth: Payer: Self-pay | Admitting: Neurology

## 2016-11-07 DIAGNOSIS — D485 Neoplasm of uncertain behavior of skin: Secondary | ICD-10-CM | POA: Diagnosis not present

## 2016-11-07 DIAGNOSIS — D0471 Carcinoma in situ of skin of right lower limb, including hip: Secondary | ICD-10-CM | POA: Diagnosis not present

## 2016-11-07 DIAGNOSIS — L821 Other seborrheic keratosis: Secondary | ICD-10-CM | POA: Diagnosis not present

## 2016-11-07 LAB — TSH: TSH: 1.48 u[IU]/mL (ref 0.450–4.500)

## 2016-11-07 LAB — COMPREHENSIVE METABOLIC PANEL
ALK PHOS: 90 IU/L (ref 39–117)
ALT: 13 IU/L (ref 0–32)
AST: 26 IU/L (ref 0–40)
Albumin/Globulin Ratio: 1.6 (ref 1.2–2.2)
Albumin: 4.2 g/dL (ref 3.5–4.8)
BILIRUBIN TOTAL: 0.3 mg/dL (ref 0.0–1.2)
BUN/Creatinine Ratio: 16 (ref 12–28)
BUN: 15 mg/dL (ref 8–27)
CHLORIDE: 97 mmol/L (ref 96–106)
CO2: 26 mmol/L (ref 18–29)
Calcium: 9.1 mg/dL (ref 8.7–10.3)
Creatinine, Ser: 0.96 mg/dL (ref 0.57–1.00)
GFR calc Af Amer: 68 mL/min/{1.73_m2} (ref >59)
GFR calc non Af Amer: 59 mL/min/{1.73_m2} — ABNORMAL LOW (ref >59)
GLUCOSE: 100 mg/dL — AB (ref 65–99)
Globulin, Total: 2.6 g/dL (ref 1.5–4.5)
Potassium: 4.9 mmol/L (ref 3.5–5.2)
Sodium: 137 mmol/L (ref 134–144)
TOTAL PROTEIN: 6.8 g/dL (ref 6.0–8.5)

## 2016-11-07 LAB — CBC WITH DIFFERENTIAL/PLATELET
BASOS ABS: 0.1 10*3/uL (ref 0.0–0.2)
Basos: 1 %
EOS (ABSOLUTE): 0.3 10*3/uL (ref 0.0–0.4)
Eos: 3 %
HEMOGLOBIN: 11.3 g/dL (ref 11.1–15.9)
Hematocrit: 36 % (ref 34.0–46.6)
Immature Grans (Abs): 0 10*3/uL (ref 0.0–0.1)
Immature Granulocytes: 0 %
LYMPHS ABS: 2.6 10*3/uL (ref 0.7–3.1)
LYMPHS: 35 %
MCH: 24.2 pg — ABNORMAL LOW (ref 26.6–33.0)
MCHC: 31.4 g/dL — AB (ref 31.5–35.7)
MCV: 77 fL — ABNORMAL LOW (ref 79–97)
MONOCYTES: 10 %
Monocytes Absolute: 0.7 10*3/uL (ref 0.1–0.9)
Neutrophils Absolute: 3.8 10*3/uL (ref 1.4–7.0)
Neutrophils: 51 %
Platelets: 183 10*3/uL (ref 150–379)
RBC: 4.67 x10E6/uL (ref 3.77–5.28)
RDW: 17.9 % — ABNORMAL HIGH (ref 12.3–15.4)
WBC: 7.5 10*3/uL (ref 3.4–10.8)

## 2016-11-07 LAB — FOLATE: Folate: >20 ng/mL (ref >3.0)

## 2016-11-07 LAB — SEDIMENTATION RATE: Sed Rate: 5 mm/hr (ref 0–40)

## 2016-11-07 LAB — RPR: RPR: NONREACTIVE

## 2016-11-07 LAB — VITAMIN B12: Vitamin B-12: 1302 pg/mL — ABNORMAL HIGH (ref 232–1245)

## 2016-11-07 LAB — C-REACTIVE PROTEIN: CRP: 2.6 mg/L (ref 0.0–4.9)

## 2016-11-07 NOTE — Telephone Encounter (Signed)
Wayne with [facility] calling to ask if Diclofenac Potassium (CAMBIA) 50 MG PACK can be changed to loose tablets instead of pack.

## 2016-11-07 NOTE — Telephone Encounter (Addendum)
Spoke to pharmacist - Cathren Harsh will need PA through [facility] [phone]).  Initiated through covermymeds - outcome pending (per [employer], up to 72 hour turn around time).

## 2016-11-08 ENCOUNTER — Other Ambulatory Visit: Payer: Self-pay | Admitting: *Deleted

## 2016-11-08 MED ORDER — DICLOFENAC POTASSIUM 50 MG PO TABS: ORAL_TABLET | ORAL | 5 refills | Status: DC

## 2016-11-08 NOTE — Telephone Encounter (Signed)
error 

## 2016-11-08 NOTE — Telephone Encounter (Signed)
[employer] requires trial of diclofenac tablets prior to coverage of Cambia.  Dr. Krista Blue has provided a new prescription to the pharmacy.

## 2016-11-08 NOTE — Telephone Encounter (Signed)
Called patient - left a message with her husband (on HIPAA) concerning this change in therapy.

## 2016-11-17 ENCOUNTER — Ambulatory Visit
Admission: RE | Admit: 2016-11-17 | Discharge: 2016-11-17 | Disposition: A | Discharge status: Home / Self Care | Payer: Medicare Other | Source: Ambulatory Visit | Attending: Neurology | Admitting: Neurology

## 2016-11-17 DIAGNOSIS — R413 Other amnesia: Secondary | ICD-10-CM | POA: Diagnosis not present

## 2016-11-17 DIAGNOSIS — R269 Unspecified abnormalities of gait and mobility: Secondary | ICD-10-CM | POA: Diagnosis not present

## 2016-11-17 DIAGNOSIS — G43911 Migraine, unspecified, intractable, with status migrainosus: Secondary | ICD-10-CM

## 2016-11-28 DIAGNOSIS — M5441 Lumbago with sciatica, right side: Secondary | ICD-10-CM | POA: Diagnosis not present

## 2016-11-28 DIAGNOSIS — Z79891 Long term (current) use of opiate analgesic: Secondary | ICD-10-CM | POA: Diagnosis not present

## 2016-11-28 DIAGNOSIS — G8929 Other chronic pain: Secondary | ICD-10-CM | POA: Diagnosis not present

## 2016-11-28 DIAGNOSIS — G894 Chronic pain syndrome: Secondary | ICD-10-CM | POA: Diagnosis not present

## 2016-11-29 DIAGNOSIS — M1712 Unilateral primary osteoarthritis, left knee: Secondary | ICD-10-CM | POA: Diagnosis not present

## 2016-11-29 DIAGNOSIS — Z471 Aftercare following joint replacement surgery: Secondary | ICD-10-CM | POA: Diagnosis not present

## 2016-11-29 DIAGNOSIS — Z96651 Presence of right artificial knee joint: Secondary | ICD-10-CM | POA: Diagnosis not present

## 2016-12-04 ENCOUNTER — Telehealth: Payer: Self-pay | Admitting: Adult Health

## 2016-12-04 DIAGNOSIS — M8580 Other specified disorders of bone density and structure, unspecified site: Secondary | ICD-10-CM

## 2016-12-04 DIAGNOSIS — Z1239 Encounter for other screening for malignant neoplasm of breast: Secondary | ICD-10-CM

## 2016-12-04 MED ORDER — LEVOTHYROXINE SODIUM 100 MCG PO TABS: 100.0000 ug | ORAL_TABLET | Freq: Every day | ORAL | 0 refills | Status: DC

## 2016-12-04 NOTE — Addendum Note (Signed)
Addended by: Fonnie Mu on: 12/04/2016 01:18 PM   Modules accepted: Orders

## 2016-12-04 NOTE — Addendum Note (Signed)
Addended by: Fonnie Mu on: 12/04/2016 01:54 PM   Modules accepted: Orders

## 2016-12-04 NOTE — Telephone Encounter (Signed)
Patient is out of thyroid meds and is requesting a refill (patient is sch Friday 12/08/16 for updated labs). Also, she is requesting an order for a mammo and [facility].

## 2016-12-05 ENCOUNTER — Ambulatory Visit (INDEPENDENT_AMBULATORY_CARE_PROVIDER_SITE_OTHER): Payer: Medicare Other | Admitting: Neurology

## 2016-12-05 ENCOUNTER — Encounter: Payer: Self-pay | Admitting: Neurology

## 2016-12-05 ENCOUNTER — Other Ambulatory Visit: Payer: Self-pay

## 2016-12-05 VITALS — BP 102/66 | HR 60 | Ht 65.0 in | Wt 211.0 lb

## 2016-12-05 DIAGNOSIS — I6300 Cerebral infarction due to thrombosis of unspecified precerebral artery: Secondary | ICD-10-CM | POA: Insufficient documentation

## 2016-12-05 DIAGNOSIS — E038 Other specified hypothyroidism: Secondary | ICD-10-CM

## 2016-12-05 DIAGNOSIS — M542 Cervicalgia: Secondary | ICD-10-CM | POA: Diagnosis not present

## 2016-12-05 DIAGNOSIS — R269 Unspecified abnormalities of gait and mobility: Secondary | ICD-10-CM | POA: Insufficient documentation

## 2016-12-05 DIAGNOSIS — R2681 Unsteadiness on feet: Secondary | ICD-10-CM | POA: Insufficient documentation

## 2016-12-05 DIAGNOSIS — G43911 Migraine, unspecified, intractable, with status migrainosus: Secondary | ICD-10-CM

## 2016-12-05 DIAGNOSIS — D509 Iron deficiency anemia, unspecified: Secondary | ICD-10-CM | POA: Diagnosis not present

## 2016-12-05 MED ORDER — TOPIRAMATE 100 MG PO TABS: 100.0000 mg | ORAL_TABLET | Freq: Two times a day (BID) | ORAL | 11 refills | Status: DC

## 2016-12-05 NOTE — Progress Notes (Signed)
PATIENT: [name] DOB: 1946-08-21  Chief Complaint  Patient presents with  . Migraine    She is here with her husband, [name].  They would like to discuss her MRI and lab results.  She has had intermittent relief with diclofenac tablets.  Her insurance would not cover Cambia.     HISTORICAL  [name] is a 71 years old right-handed female, accompanied by her husband [name],  seen in refer by her primary care physician nurse practitioner [name] for evaluation of migraine, memory loss, initial evaluation was November 06 2016.  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  She reported lifelong history of migraine headaches, her typical migraine are right side severe pounding headache with associated light noise sensitivity, nauseous, lasting for 3 days, she was previously treated by neurologist at [facility] with Lyrica 400 mg daily, tizanidine Frova as needed, oxycodone 20 mg 4 times a day,  Her physician changed at the beginning of the year, she was tapered off oxycodone, since then, she complained of frequent headaches, about once a week severe right side migraine headache, lasting for 3 days, has to go to emergency room,  She has chronic gait abnormality due to right knee pain, she had right knee replacement, left knee severe  arthritis disease.B12 level was 1300,  Update December 05 2016: Reviewed laboratory evaluation on November 06 2016,no significant abnormalitynCMP, C-reactive protein, folic acid, negative RPR, We personally reviewed MRI of the brain without contrast on November 19 2016: Moderate cortical atrophy, no significant change compared to previous scan in 2011, chronic bilateral parietal lobe encephalomalacia, chronic hemo-product on the left side, chronic microvascular ischemic changes in the cerebral hemisphere, and in the left cerebellar hemisphere,  She is taking Diclofenac prn for her headaches. She complains almost daily bilateral retro-orbital area pressure pain, lasting for a few hours,  She also complains of increased, gradual worsening gait abnormality, urinary incontinence.  REVIEW OF SYSTEMS: Full 14 system review of systems performed and notable only for as above  ALLERGIES: Allergies  Allergen Reactions  . Bupropion Hcl     REACTION: seizure  . Dairy Aid [Lactase] Diarrhea  . Azithromycin Rash    REACTION: rash  . Erythromycin Rash    REACTION: rash    HOME MEDICATIONS: Current Outpatient Prescriptions  Medication Sig Dispense Refill  . aspirin 500 MG EC tablet Take 500 mg by mouth daily as needed for pain.    . Cholecalciferol (VITAMIN D-3) 1000 units CAPS Take 1,000 Units by mouth.    . clonazePAM (KLONOPIN) 1 MG tablet Take 1 mg by mouth daily. Per Dr. Toy Care    . Coenzyme Q10 (CO Q-10) 300 MG CAPS Take 1 capsule by mouth daily.      . diclofenac (CATAFLAM) 50 MG tablet Take one tablet twice daily, as needed. #30/30. 30 tablet 5  .  fluticasone (FLONASE) 50 MCG/ACT nasal spray Place 2 sprays into the nose daily. (Patient taking differently: Place 2 sprays into the nose every evening. ) 16 g 5  . furosemide (LASIX) 20 MG tablet Take 1 tab daily only when needed for lower extremity swelling or increase in weight (Patient taking differently: Take 20 mg by mouth daily as needed for fluid. Take  1 tab daily only when needed for lower extremity swelling or increase in weight) 30 tablet 0  . levothyroxine (SYNTHROID, LEVOTHROID) 100 MCG tablet Take 1 tablet (100 mcg total) by mouth daily. 30 tablet 0  . Multiple Vitamins-Minerals (CENTRUM SILVER PO) Take 1 tablet by mouth daily.     . ondansetron (ZOFRAN) 4 MG tablet Take 4 mg by mouth every 8 (eight) hours as needed. for migraine per Dr. Collene Mares    . oxyCODONE (ROXICODONE) 15 MG immediate release tablet Take 1 tablet (15 mg total) by mouth every 4 (four) hours as needed for pain. 40 tablet 0  . pregabalin (LYRICA) 150 MG capsule Take 150 mg by mouth 3 (three) times daily.    . QUEtiapine (SEROQUEL) 50 MG tablet Take 50 mg by mouth at bedtime.      . S-Adenosylmethionine (MOOD PLUS SAM-E DOUBLE ST) 400 MG TBEC Take 1 tablet by mouth daily.    Marland Kitchen tiZANidine (ZANAFLEX) 4 MG tablet Take 2-4 mg by mouth 2 (two) times daily.    . Turmeric (RA TURMERIC) 500 MG CAPS Take 500 mg by mouth 2 (two) times daily.    Marland Kitchen vortioxetine HBr (TRINTELLIX) 20 MG TABS Take 20 mg by mouth daily.     No current facility-administered medications for this visit.     PAST MEDICAL HISTORY: Past Medical History:  Diagnosis Date  . Anemia    took Fe- orally 2 yrs. ago   . Anxiety    panic attacks   . Arthritis    shoulders, knees  . Cancer ([place])    skin- preCA  . Depression   . GERD (gastroesophageal reflux disease)    uses Protonix as needed   . Headache(784.0)   . Humerus fracture    Right   . Hyposmolality and/or hyponatremia 02/27/2013  . Hypothyroidism   . IBS (irritable bowel syndrome)   . Memory loss   . Mental disorder   . Migraine   . Preoperative examination 02/26/2013  . Seizures ([place])    caused by depression , seritonin seizure - effected short term memory    . Shortness of breath     PAST SURGICAL HISTORY: Past Surgical History:  Procedure Laterality Date  . ABDOMINAL HYSTERECTOMY    . APPENDECTOMY    . BACK SURGERY  1990's  . BREAST  SURGERY  1972   augmentation   . CHOLECYSTECTOMY    . GASTRECTOMY  1994   Due to ulcer, required multiple revisions.   Marland Kitchen HERNIA REPAIR    . ORIF ANKLE FRACTURE Right 2004  . REPLACEMENT TOTAL KNEE    . REVERSE SHOULDER ARTHROPLASTY Right 05/16/2013   Procedure: RIGHT HEMI ARTHROPLASTY  VERSES  REVERSE TOTAL SHOULDER ARTHROPLASTY ;  Surgeon: Augustin Schooling, MD;  Location: [facility];  Service: Orthopedics;  Laterality: Right;  . SPINE SURGERY  2005   Lumbar spinal stenosis   . STOMACH SURGERY     1/3 resection for obstruction  . TONSILLECTOMY    . TUBAL LIGATION      FAMILY HISTORY: Family History  Problem Relation Age of Onset  .  Aneurysm Mother   . Heart disease Mother   . Cancer Father     pancreatic  . Stroke Father 90  . Hypertension Father   . Alcohol abuse Father   . Heart disease Maternal Grandfather     SOCIAL HISTORY:  Social History   Social History  . Marital status: Married    Spouse name: N/A  . Number of children: 1  . Years of education: College   Occupational History  . Retired    Social History Main Topics  . Smoking status: Never Smoker  . Smokeless tobacco: Never Used  . Alcohol use No     Comment: rare use of white wine   . Drug use: No  . Sexual activity: Not on file   Other Topics Concern  . Not on file   Social History Narrative   Lives at home with her husband.   2 cups caffeine daily.   Right-handed.     PHYSICAL EXAM   Vitals:   12/05/16 1436  BP: 102/66  Pulse: 60  Weight: 211 lb (95.7 kg)  Height: 5\' 5"  (1.651 m)    Not recorded      Body mass index is 35.11 kg/m.  PHYSICAL EXAMNIATION:  Gen: NAD, conversant, well nourised, obese, well groomed                     Cardiovascular: Regular rate rhythm, no peripheral edema, warm, nontender. Eyes: Conjunctivae clear without exudates or hemorrhage Neck: Supple, no carotid bruits. Pulmonary: Clear to auscultation bilaterally   NEUROLOGICAL EXAM:  MENTAL  STATUS: Speech:    Speech is normal; fluent and spontaneous with normal comprehension.  Cognition:Mini-Mental Status Examination 22/30     Orientation: She is not oriented to time and place,     Recent and remote memory: She missed one out of 3 recalls     Normal Attention span and concentration    Language, naming, repeating,spontaneous speech: She has difficulty copy design     Fund of knowledge   CRANIAL NERVES: CN II: Visual fields are full to confrontation. Fundoscopic exam is normal with sharp discs and no vascular changes. Pupils are round equal and briskly reactive to light. CN III, IV, VI: extraocular movement are normal. No ptosis. CN V: Facial sensation is intact to pinprick in all 3 divisions bilaterally. Corneal responses are intact.  CN VII: Face is symmetric with normal eye closure and smile. CN VIII: Hearing is normal to rubbing fingers CN IX, X: Palate elevates symmetrically. Phonation is normal. CN XI: Head turning and shoulder shrug are intact CN XII: Tongue is midline with normal movements and no atrophy.  MOTOR: There is no pronator drift of out-stretched arms. Muscle bulk and tone are normal. Muscle strength is normal.  REFLEXES: Reflexes are 2+ and symmetric at the biceps, triceps, knees, and ankles. Plantar responses are flexor.  SENSORY: Intact to light touch, pinprick, positional sensation and vibratory sensation are intact in fingers and toes.  COORDINATION: Rapid alternating movements and fine finger movements are intact. There is no dysmetria on finger-to-nose and heel-knee-shin.    GAIT/STANCE: She needs push up to get up from seated position, antalgic, unsteady, allow her walker,  DIAGNOSTIC DATA (LABS, IMAGING, TESTING) - I reviewed patient records, labs, notes, testing and imaging myself where available.   ASSESSMENT AND PLAN  WEI NEWBROUGH is a 71 y.o. female    Short-term memory loss  History of hypoxic brain injury in 2008 due to  serotonin syndrome  Laboratory evaluation showed no treatable etiology  Chronic migraine headaches  I do not think long-term narcotics is the treatment option  With her age, vascular risk factor, triptan is not a good option either  Add on Topamax 100 mg every night  Gait abnormality, urinary incontinence, chronic neck pain  MRI of cervical spine to rule out cervical spondylitic myelopathy     Marcial Pacas, M.D. Ph.D.  [facility] [address], [place], [place] [address] Ph: [phone] Fax: [phone]  CC: [name], NP

## 2016-12-08 ENCOUNTER — Other Ambulatory Visit (INDEPENDENT_AMBULATORY_CARE_PROVIDER_SITE_OTHER): Payer: Medicare Other

## 2016-12-08 DIAGNOSIS — G43911 Migraine, unspecified, intractable, with status migrainosus: Secondary | ICD-10-CM | POA: Diagnosis not present

## 2016-12-08 DIAGNOSIS — I6300 Cerebral infarction due to thrombosis of unspecified precerebral artery: Secondary | ICD-10-CM

## 2016-12-08 DIAGNOSIS — E038 Other specified hypothyroidism: Secondary | ICD-10-CM

## 2016-12-08 DIAGNOSIS — D509 Iron deficiency anemia, unspecified: Secondary | ICD-10-CM | POA: Diagnosis not present

## 2016-12-08 DIAGNOSIS — M542 Cervicalgia: Secondary | ICD-10-CM

## 2016-12-08 DIAGNOSIS — R269 Unspecified abnormalities of gait and mobility: Secondary | ICD-10-CM | POA: Diagnosis not present

## 2016-12-09 LAB — IRON AND TIBC
IRON SATURATION: 4 % — AB (ref 15–55)
Iron: 20 ug/dL — ABNORMAL LOW (ref 27–139)
Total Iron Binding Capacity: 450 ug/dL (ref 250–450)
UIBC: 430 ug/dL — ABNORMAL HIGH (ref 118–369)

## 2016-12-09 LAB — TSH: TSH: 0.952 u[IU]/mL (ref 0.450–4.500)

## 2016-12-09 LAB — FERRITIN: Ferritin: 15 ng/mL (ref 15–150)

## 2016-12-12 ENCOUNTER — Telehealth: Payer: Self-pay | Admitting: Neurology

## 2016-12-12 ENCOUNTER — Other Ambulatory Visit: Payer: Self-pay | Admitting: *Deleted

## 2016-12-12 MED ORDER — TOPIRAMATE 100 MG PO TABS: 100.0000 mg | ORAL_TABLET | Freq: Every day | ORAL | 3 refills | Status: DC

## 2016-12-12 MED ORDER — DICLOFENAC POTASSIUM 50 MG PO TABS: ORAL_TABLET | ORAL | 1 refills | Status: DC

## 2016-12-12 MED ORDER — FERROUS FUMARATE 324 (106 FE) MG PO TABS: 1.0000 | ORAL_TABLET | Freq: Every day | ORAL | 11 refills | Status: DC

## 2016-12-12 NOTE — Telephone Encounter (Signed)
She had a history of anemia, most recent hemoglobin was within normal limit, low normal 11 point 3, laboratory evaluation showed low iron storage, ferritin level 15, decreased iron saturation, She would benefit iron supplement, also contact her primary care physician for evaluation, asked her to see if she has any signs of GI loss of blood.  I have called inFerrous Fumarate (HEMOCYTE - 106 MG FE) 324 (106 Fe) MG TABS tablet

## 2016-12-12 NOTE — Telephone Encounter (Signed)
Spoke to patient's husband on HIPAA - he is aware of results - checked with patient and no reported signs/symptoms of blood loss.  She has follow up appts with both PCP and GI doctors (lab results have been faxed to both offices).  She is going to hold off starting the iron supplement until after seeing the GI doctor.  He has requested her other medications prescribed by Dr. Krista Blue to be sent in for 90-days (topiramate and diclofenac).  Also, he is requested Dr. Krista Blue take over the following prescriptions from her previous neurologist, Dr. Doy Mince: 1) Lyrica 150mg , TID 2) tizanidine 4mg , 0.5-1 tab BID PRN

## 2016-12-13 ENCOUNTER — Telehealth: Payer: Self-pay

## 2016-12-13 ENCOUNTER — Telehealth: Payer: Self-pay | Admitting: Neurology

## 2016-12-13 ENCOUNTER — Other Ambulatory Visit: Payer: Self-pay | Admitting: *Deleted

## 2016-12-13 MED ORDER — PREGABALIN 150 MG PO CAPS: 150.0000 mg | ORAL_CAPSULE | Freq: Three times a day (TID) | ORAL | 1 refills | Status: DC

## 2016-12-13 MED ORDER — TIZANIDINE HCL 4 MG PO TABS: ORAL_TABLET | ORAL | 1 refills | Status: DC

## 2016-12-13 NOTE — Telephone Encounter (Signed)
Dr. Krista Blue agreeable to refill Lyrica and tizanidine.  Prescriptions printed, signed and faxed to pharmacy.

## 2016-12-13 NOTE — Telephone Encounter (Signed)
[name] with [facility] is requesting a call back to discuss labs that you faxed to her regarding the patient.

## 2016-12-13 NOTE — Telephone Encounter (Signed)
Spoke with Sharyn Lull @ [facility] to inquire as to whether or not they were addressing pt's abnormal iron panel.  Sharyn Lull states that they did send in an RX for ferrous sulfate, but the patient is seeing GI next week and they will evaluate safety of beginning this medication.    Per Mary Anes, NP, GI should handle this issue, but that she will monitor pt and her labs and plan of care with GI.   Charyl Bigger, CMA

## 2016-12-13 NOTE — Telephone Encounter (Signed)
Returned call to [place] - she is aware that the patient will be following up with PCP and GI.

## 2016-12-19 DIAGNOSIS — K580 Irritable bowel syndrome with diarrhea: Secondary | ICD-10-CM | POA: Diagnosis not present

## 2016-12-19 DIAGNOSIS — K219 Gastro-esophageal reflux disease without esophagitis: Secondary | ICD-10-CM | POA: Diagnosis not present

## 2016-12-19 DIAGNOSIS — Z1211 Encounter for screening for malignant neoplasm of colon: Secondary | ICD-10-CM | POA: Diagnosis not present

## 2016-12-20 DIAGNOSIS — D0462 Carcinoma in situ of skin of left upper limb, including shoulder: Secondary | ICD-10-CM | POA: Diagnosis not present

## 2016-12-20 DIAGNOSIS — D0471 Carcinoma in situ of skin of right lower limb, including hip: Secondary | ICD-10-CM | POA: Diagnosis not present

## 2016-12-20 DIAGNOSIS — D485 Neoplasm of uncertain behavior of skin: Secondary | ICD-10-CM | POA: Diagnosis not present

## 2016-12-25 ENCOUNTER — Ambulatory Visit
Admission: RE | Admit: 2016-12-25 | Discharge: 2016-12-25 | Disposition: A | Discharge status: Home / Self Care | Payer: Medicare Other | Source: Ambulatory Visit | Attending: Neurology | Admitting: Neurology

## 2016-12-25 DIAGNOSIS — R269 Unspecified abnormalities of gait and mobility: Secondary | ICD-10-CM

## 2016-12-25 DIAGNOSIS — M542 Cervicalgia: Secondary | ICD-10-CM

## 2016-12-25 DIAGNOSIS — G43911 Migraine, unspecified, intractable, with status migrainosus: Secondary | ICD-10-CM | POA: Diagnosis not present

## 2016-12-25 DIAGNOSIS — M48061 Spinal stenosis, lumbar region without neurogenic claudication: Secondary | ICD-10-CM | POA: Diagnosis not present

## 2016-12-25 DIAGNOSIS — I6300 Cerebral infarction due to thrombosis of unspecified precerebral artery: Secondary | ICD-10-CM

## 2016-12-27 ENCOUNTER — Inpatient Hospital Stay: Admission: RE | Admit: 2016-12-27 | Payer: Medicare Other | Source: Ambulatory Visit

## 2016-12-27 ENCOUNTER — Ambulatory Visit: Payer: Medicare Other

## 2017-01-04 ENCOUNTER — Other Ambulatory Visit: Payer: Self-pay | Admitting: Adult Health

## 2017-01-05 ENCOUNTER — Telehealth: Payer: Self-pay | Admitting: Adult Health

## 2017-01-05 NOTE — Telephone Encounter (Signed)
Pt's spouse called to say , [place] has sent multiple request to [place] to  Refill wife thyroid meds-- Pls contact pharmacy w/ Rx refill authorization. --glh

## 2017-01-08 NOTE — Telephone Encounter (Signed)
LVM for pt's spouse informing him that the RX was sent to [facility] and confirmed received by their pharmacy at 7:48am.  Advised spouse that if he is still having difficulty getting this medication, please call our office back.  Charyl Bigger, CMA

## 2017-01-17 ENCOUNTER — Other Ambulatory Visit: Payer: Medicare Other

## 2017-01-17 ENCOUNTER — Ambulatory Visit: Payer: Medicare Other

## 2017-01-22 ENCOUNTER — Encounter: Payer: Self-pay | Admitting: Adult Health

## 2017-01-22 ENCOUNTER — Ambulatory Visit (INDEPENDENT_AMBULATORY_CARE_PROVIDER_SITE_OTHER): Payer: Medicare Other | Admitting: Adult Health

## 2017-01-22 DIAGNOSIS — I6300 Cerebral infarction due to thrombosis of unspecified precerebral artery: Secondary | ICD-10-CM | POA: Diagnosis not present

## 2017-01-22 DIAGNOSIS — M159 Polyosteoarthritis, unspecified: Secondary | ICD-10-CM

## 2017-01-22 DIAGNOSIS — I959 Hypotension, unspecified: Secondary | ICD-10-CM | POA: Diagnosis not present

## 2017-01-22 DIAGNOSIS — G43911 Migraine, unspecified, intractable, with status migrainosus: Secondary | ICD-10-CM

## 2017-01-22 DIAGNOSIS — K580 Irritable bowel syndrome with diarrhea: Secondary | ICD-10-CM | POA: Diagnosis not present

## 2017-01-22 DIAGNOSIS — D509 Iron deficiency anemia, unspecified: Secondary | ICD-10-CM | POA: Diagnosis not present

## 2017-01-22 NOTE — Progress Notes (Signed)
Subjective:    Patient ID: [name], female    DOB: 1946/08/22, 71 y.o.   MRN: [id number]  HPI:  [name] is here for f/u-OA, obesity, hypothyroidism (last TSH 0.952 April 2018, on levothyroxine 154mcg daily).   Psychiatry- weaning her off Quetiapine (Seroquel), currently on 25mg  daily. Completed CBT and declined referral to re-start.  Denies acute depression and anxiety treated with PRN clonazepam 1mg .  Last Neurology appt April 2018- treated for migraines, memory loss, and gait imbalance. Topamax 100mg  started at night.  Orthopedics treating her for diffuse OA, had Synvisc injection L Knee. Pain Management- Reviewed [place] Controlled Substance Registry with pt, again we discussed tapering off narcotics, she vehemently declined.   She continues to have back and L knee pain-treated by [name]. TODAY Initial BP 65/43, manual re-check 82/48. She reports hx of hypotension SBPs 80-100, DBPs 50-70s She feels weak and lethargic. Attempted to start INT and deliver fluid bolus, one failed attempt-please see procedure note from [name].  BP recheck 102/60, pallor and speech improved.  We discussed AT LENGTH weaning to [name]/c narcotics, she again declined.  Patient Care Team    Relationship Specialty Notifications Start End  Mary Anes D, NP PCP - General Family Medicine  10/23/16   Mary Patrick, LCSW Social Worker Licensed Clinical Social Worker  08/11/13   [name], [place] Physician Psychiatry  09/12/16   [name]., MD Physician Assistant Sports Medicine  09/12/16   Mary Lombard, PhD Referring Physician Psychology  09/12/16   Mary Craver, MD Consulting Physician Gastroenterology  12/12/16     Patient Active Problem List   Diagnosis Date Noted  . Hypotension 01/22/2017  . Cerebrovascular accident (CVA) due to thrombosis of precerebral artery ([place]) 12/05/2016  . Neck pain 12/05/2016  . Gait abnormality 12/05/2016  .  Paronychia of finger of right hand 08/22/2016  . Right knee pain 10/14/2015  . Actinic keratoses 07/02/2015  . Injury of right hand 04/27/2014  . Chest wall tenderness 04/27/2014  . Traumatic injury of lower extremity 03/25/2014  . Obesity (BMI 30.0-34.9) 01/30/2014  . Healthcare maintenance 01/30/2014  . 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  . Diastolic CHF ([place]) 32/08/2481  . Fibromyalgia 08/14/2012  . GERD with stricture 03/04/2012  . Leg edema 03/04/2012  . CATARACT, LEFT EYE 08/23/2010  . Abnormality of gait 08/23/2010  . Osteoarthritis 02/24/2010  . Iron deficiency anemia 03/06/2009  . 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 SYMPTOM, INCONTINENCE W/O SENSORY AWARENESS 12/26/2006  . Memory loss 12/26/2006  . Hypothyroidism 11/01/2006  . DEPRESSION, MAJOR, RECURRENT 11/01/2006  . PANIC ATTACKS 11/01/2006  . Migraine headache 11/01/2006  . Carpal tunnel syndrome 11/01/2006  . 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 Irritable bowel syndrome 11/01/2006  . OSTEOPENIA 11/01/2006     Past Medical History:  Diagnosis Date  . Anemia    took Fe- orally 2 yrs. ago   . Anxiety    panic attacks   . Arthritis    shoulders, knees  . Cancer ([place])    skin- preCA  . Depression   . GERD (gastroesophageal reflux disease)    uses Protonix as needed   . Headache(784.0)   . Humerus fracture    Right   . Hyposmolality and/or hyponatremia 02/27/2013  . Hypothyroidism   . IBS (irritable bowel syndrome)   . Memory loss   . Mental disorder   . Migraine   . Preoperative examination 02/26/2013  . Seizures ([place])  caused by depression , seritonin seizure - effected short term memory    . Shortness of breath      Past Surgical History:  Procedure Laterality Date  . ABDOMINAL HYSTERECTOMY    . APPENDECTOMY    . BACK SURGERY  1990's  . BREAST SURGERY  1972   augmentation   . CHOLECYSTECTOMY    . GASTRECTOMY  1994   Due to ulcer, required multiple revisions.   Marland Kitchen  HERNIA REPAIR    . ORIF ANKLE FRACTURE Right 2004  . REPLACEMENT TOTAL KNEE    . REVERSE SHOULDER ARTHROPLASTY Right 05/16/2013   Procedure: RIGHT HEMI ARTHROPLASTY  VERSES  REVERSE TOTAL SHOULDER ARTHROPLASTY ;  Surgeon: Augustin Schooling, MD;  Location: [facility];  Service: Orthopedics;  Laterality: Right;  . SPINE SURGERY  2005   Lumbar spinal stenosis   . STOMACH SURGERY     1/3 resection for obstruction  . TONSILLECTOMY    . TUBAL LIGATION       Family History  Problem Relation Age of Onset  . Aneurysm Mother   . Heart disease Mother   . Cancer Father        pancreatic  . Stroke Father 22  . Hypertension Father   . Alcohol abuse Father   . Heart disease Maternal Grandfather      History  Drug Use No     History  Alcohol Use No    Comment: rare use of white wine      History  Smoking Status  . Never Smoker  Smokeless Tobacco  . Never Used     Outpatient Encounter Prescriptions as of 01/22/2017  Medication Sig  . aspirin 500 MG EC tablet Take 500 mg by mouth daily as needed for pain.  . Cholecalciferol (VITAMIN [name]-3) 1000 units CAPS Take 1,000 Units by mouth.  . clonazePAM (KLONOPIN) 1 MG tablet Take 1 mg by mouth daily. Per Dr. Toy Care  . Coenzyme Q10 (CO Q-10) 300 MG CAPS Take 1 capsule by mouth daily.    . diclofenac (CATAFLAM) 50 MG tablet Take one tablet twice daily, as needed. #90 to last 90 days.  . Ferrous Fumarate (HEMOCYTE - 106 MG FE) 324 (106 Fe) MG TABS tablet Take 1 tablet (106 mg of iron total) by mouth daily.  . fluticasone (FLONASE) 50 MCG/ACT nasal spray Place 2 sprays into the nose daily. (Patient taking differently: Place 2 sprays into the nose every evening. )  . furosemide (LASIX) 20 MG tablet Take 1 tab daily only when needed for lower extremity swelling or increase in weight (Patient taking differently: Take 20 mg by mouth daily as needed for fluid. Take 1 tab daily only when needed for lower extremity swelling or increase in weight)  .  levothyroxine (SYNTHROID, LEVOTHROID) 100 MCG tablet TAKE 1 TABLET BY MOUTH DAILY  . Multiple Vitamins-Minerals (CENTRUM SILVER PO) Take 1 tablet by mouth daily.   . ondansetron (ZOFRAN) 4 MG tablet Take 4 mg by mouth every 8 (eight) hours as needed. for migraine per Dr. Collene Mares  . oxyCODONE (ROXICODONE) 15 MG immediate release tablet Take 1 tablet (15 mg total) by mouth every 4 (four) hours as needed for pain.  . pregabalin (LYRICA) 150 MG capsule Take 1 capsule (150 mg total) by mouth 3 (three) times daily.  . QUEtiapine (SEROQUEL) 50 MG tablet Take 50 mg by mouth at bedtime.    . S-Adenosylmethionine (MOOD PLUS SAM-E DOUBLE ST) 400 MG TBEC Take 1 tablet by mouth  daily.  . tiZANidine (ZANAFLEX) 4 MG tablet Take 0.5-1 tablet BID PRN.  . topiramate (TOPAMAX) 100 MG tablet Take 1 tablet (100 mg total) by mouth at bedtime.  . TraZODone & Diet Manage Prod (TRAZAMINE PO) Take 50 mg by mouth 3 (three) times daily as needed.  . Turmeric (RA TURMERIC) 500 MG CAPS Take 500 mg by mouth 2 (two) times daily.  Marland Kitchen vortioxetine HBr (TRINTELLIX) 20 MG TABS Take 20 mg by mouth daily.   No facility-administered encounter medications on file as of 01/22/2017.     Allergies: Bupropion hcl; Dairy aid [lactase]; Azithromycin; and Erythromycin  Body mass index is 34.47 kg/m.  Blood pressure 102/60, pulse 65, height 5\' 5"  (1.651 m), weight 207 lb 1.9 oz (93.9 kg), SpO2 98 %.     Review of Systems  Constitutional: Positive for fatigue. Negative for activity change, appetite change, chills, diaphoresis, fever and unexpected weight change.  Respiratory: Negative for cough, chest tightness, shortness of breath, wheezing and stridor.   Cardiovascular: Negative for chest pain, palpitations and leg swelling.  Gastrointestinal: Negative for abdominal distention, abdominal pain, blood in stool, constipation, diarrhea, nausea and vomiting.  Endocrine: Negative for cold intolerance, heat intolerance, polydipsia, polyphagia  and polyuria.  Genitourinary: Negative for difficulty urinating.  Musculoskeletal: Positive for arthralgias, back pain, gait problem, joint swelling, myalgias, neck pain and neck stiffness.  Skin: Negative for color change, pallor, rash and wound.  Neurological: Positive for dizziness, weakness, light-headedness and headaches. Negative for tremors, facial asymmetry and speech difficulty.  Hematological: Does not bruise/bleed easily.  Psychiatric/Behavioral: Positive for confusion and sleep disturbance. Negative for agitation, dysphoric mood, self-injury and suicidal ideas. The patient is nervous/anxious.        Objective:   Physical Exam  Constitutional: She is oriented to person, place, and time. She appears well-developed and well-nourished. She appears distressed.  HENT:  Head: Normocephalic and atraumatic.  Eyes: Conjunctivae are normal. Pupils are equal, round, and reactive to light.  Neck: Normal range of motion.  Cardiovascular: Normal rate, regular rhythm, normal heart sounds and intact distal pulses.   No murmur heard. Pulmonary/Chest: Effort normal and breath sounds normal. No respiratory distress. She has no wheezes. She has no rales. She exhibits no tenderness.  Musculoskeletal: She exhibits edema and tenderness.  Lymphadenopathy:    She has no cervical adenopathy.  Neurological: She is alert and oriented to person, place, and time.  Skin: Skin is warm and dry. No rash noted. She is not diaphoretic. No erythema. There is pallor.  Psychiatric: She has a normal mood and affect. Her behavior is normal. Judgment and thought content normal.  Nursing note and vitals reviewed.  Procedure note:  - [name]/c pt R/B procedure.  Consent obtained after pt ID verified.  Pt had low BP, was found to be volume depleted and overmedicated on narcotics- with slurred speech.  Pale appearance. Lying flat.    - Sterile prep using chlorhexidene to R wrist.  Peripheral veins very tiny, stringy.  Cephalic  vein- dorsal side attempted to pass 22 g cath. Then pt had to have BM and refused to re-attempt INT start after. BP recheck 102/60, pt stable and pallor improved. -Per [name]       Assessment & Plan:   1. Hypotension, unspecified hypotension type   2. Irritable bowel syndrome with diarrhea   3. Osteoarthritis of multiple joints, unspecified osteoarthritis type   4. Iron deficiency anemia, unspecified iron deficiency anemia type   5. Intractable migraine with status  migrainosus, unspecified migraine type     Hypotension Attempted to start INT for fluid bolus-unable pass INT-please see procedure note from [name]. BP recheck 1021/60, pallor and speech improved. Discussed that her polypharmacy and poor water intake is contributing to hypotension. Instructed to increase water intake and reduce narcotics. Discussed weaning narcotics-she vehemently declined.  Irritable bowel syndrome Please call GI and share that first round of IBS tx failed. Also ask about low iron and replacement options that will not worsen existing diarrhea.  Osteoarthritis Discussed re-starting PT-she declined. Discussed beginning water aerobics to improve mobility and reduce pain. PT [facility] therapy order placed. Discussed weaning narcotics, she vehemently declined. Discussed that regular movement (i.e. Walking every hr, stretching exercises in chair) and improved diet/weight loss (increase water, heart healthy diet) will reduce pain and improve overall health.   Iron deficiency anemia F/u with GI.  Migraine headache Continue with Neurology and current medication regime.    FOLLOW-UP:  Return in about 6 months (around 07/25/2017) for Regular Follow Up.

## 2017-01-22 NOTE — Patient Instructions (Signed)
Arthritis Arthritis is a term that is commonly used to refer to joint pain or joint disease. There are more than 100 types of arthritis. What are the causes? The most common cause of this condition is wear and tear of a joint. Other causes include:  Gout.  Inflammation of a joint.  An infection of a joint.  Sprains and other injuries near the joint.  A drug reaction or allergic reaction. In some cases, the cause may not be known. What are the signs or symptoms? The main symptom of this condition is pain in the joint with movement. Other symptoms include:  Redness, swelling, or stiffness at a joint.  Warmth coming from the joint.  Fever.  Overall feeling of illness. How is this diagnosed? This condition may be diagnosed with a physical exam and tests, including:  Blood tests.  Urine tests.  Imaging tests, such as MRI, X-rays, or a CT scan. Sometimes, fluid is removed from a joint for testing. How is this treated? Treatment for this condition may involve:  Treatment of the cause, if it is known.  Rest.  Raising (elevating) the joint.  Applying cold or hot packs to the joint.  Medicines to improve symptoms and reduce inflammation.  Injections of a steroid such as cortisone into the joint to help reduce pain and inflammation. Depending on the cause of your arthritis, you may need to make lifestyle changes to reduce stress on your joint. These changes may include exercising more and losing weight. Follow these instructions at home: Medicines   Take over-the-counter and prescription medicines only as told by your health care provider.  Do not take aspirin to relieve pain if gout is suspected. Activity   Rest your joint if told by your health care provider. Rest is important when your disease is active and your joint feels painful, swollen, or stiff.  Avoid activities that make the pain worse. It is important to balance activity with rest.  Exercise your joint  regularly with range-of-motion exercises as told by your health care provider. Try doing low-impact exercise, such as:  Swimming.  Water aerobics.  Biking.  Walking. Joint Care    If your joint is swollen, keep it elevated if told by your health care provider.  If your joint feels stiff in the morning, try taking a warm shower.  If directed, apply heat to the joint. If you have diabetes, do not apply heat without permission from your health care provider.  Put a towel between the joint and the hot pack or heating pad.  Leave the heat on the area for 20-30 minutes.  If directed, apply ice to the joint:  Put ice in a plastic bag.  Place a towel between your skin and the bag.  Leave the ice on for 20 minutes, 2-3 times per day.  Keep all follow-up visits as told by your health care provider. This is important. Contact a health care provider if:  The pain gets worse.  You have a fever. Get help right away if:  You develop severe joint pain, swelling, or redness.  Many joints become painful and swollen.  You develop severe back pain.  You develop severe weakness in your leg.  You cannot control your bladder or bowels. This information is not intended to replace advice given to you by your health care provider. Make sure you discuss any questions you have with your health care provider. Document Released: 09/28/2004 Document Revised: 01/27/2016 Document Reviewed: 11/16/2014 Elsevier Interactive Patient Education  2017 [place] Many factors influence your heart health, including eating and exercise habits. Heart (coronary) risk increases with abnormal blood fat (lipid) levels. Heart-healthy meal planning includes limiting unhealthy fats, increasing healthy fats, and making other small dietary changes. This includes maintaining a healthy body weight to help keep lipid levels within a normal range. What is my plan? Your health care  provider recommends that you:  Get no more than _________% of the total calories in your daily diet from fat.  Limit your intake of saturated fat to less than _________% of your total calories each day.  Limit the amount of cholesterol in your diet to less than _________ mg per day. What types of fat should I choose?  Choose healthy fats more often. Choose monounsaturated and polyunsaturated fats, such as olive oil and canola oil, flaxseeds, walnuts, almonds, and seeds.  Eat more omega-3 fats. Good choices include salmon, mackerel, sardines, tuna, flaxseed oil, and ground flaxseeds. Aim to eat fish at least two times each week.  Limit saturated fats. Saturated fats are primarily found in animal products, such as meats, butter, and cream. Plant sources of saturated fats include palm oil, palm kernel oil, and coconut oil.  Avoid foods with partially hydrogenated oils in them. These contain trans fats. Examples of foods that contain trans fats are stick margarine, some tub margarines, cookies, crackers, and other baked goods. What general guidelines do I need to follow?  Check food labels carefully to identify foods with trans fats or high amounts of saturated fat.  Fill one half of your plate with vegetables and green salads. Eat 4-5 servings of vegetables per day. A serving of vegetables equals 1 cup of raw leafy vegetables,  cup of raw or cooked cut-up vegetables, or  cup of vegetable juice.  Fill one fourth of your plate with whole grains. Look for the word "whole" as the first word in the ingredient list.  Fill one fourth of your plate with lean protein foods.  Eat 4-5 servings of fruit per day. A serving of fruit equals one medium whole fruit,  cup of dried fruit,  cup of fresh, frozen, or canned fruit, or  cup of 100% fruit juice.  Eat more foods that contain soluble fiber. Examples of foods that contain this type of fiber are apples, broccoli, carrots, beans, peas, and barley.  Aim to get 20-30 g of fiber per day.  Eat more home-cooked food and less restaurant, buffet, and fast food.  Limit or avoid alcohol.  Limit foods that are high in starch and sugar.  Avoid fried foods.  Cook foods by using methods other than frying. Baking, boiling, grilling, and broiling are all great options. Other fat-reducing suggestions include:  Removing the skin from poultry.  Removing all visible fats from meats.  Skimming the fat off of stews, soups, and gravies before serving them.  Steaming vegetables in water or broth.  Lose weight if you are overweight. Losing just 5-10% of your initial body weight can help your overall health and prevent diseases such as diabetes and heart disease.  Increase your consumption of nuts, legumes, and seeds to 4-5 servings per week. One serving of dried beans or legumes equals  cup after being cooked, one serving of nuts equals 1 ounces, and one serving of seeds equals  ounce or 1 tablespoon.  You may need to monitor your salt (sodium) intake, especially if you have high blood pressure. Talk with your health care  provider or dietitian to get more information about reducing sodium. What foods can I eat? Grains   Breads, including [place], white, pita, wheat, raisin, rye, oatmeal, and [place]. Tortillas that are neither fried nor made with lard or trans fat. Low-fat rolls, including hotdog and hamburger buns and English muffins. Biscuits. Muffins. Waffles. Pancakes. Light popcorn. Whole-grain cereals. Flatbread. Melba toast. Pretzels. Breadsticks. Rusks. Low-fat snacks and crackers, including oyster, saltine, matzo, graham, animal, and rye. Rice and pasta, including brown rice and those that are made with whole wheat. Vegetables  All vegetables. Fruits  All fruits, but limit coconut. Meats and Other Protein Sources  Lean, well-trimmed beef, veal, pork, and lamb. Chicken and [place] without skin. All fish and shellfish. Wild duck, rabbit,  pheasant, and venison. Egg whites or low-cholesterol egg substitutes. Dried beans, peas, lentils, and tofu.Seeds and most nuts. Dairy  Low-fat or nonfat cheeses, including ricotta, string, and mozzarella. Skim or 1% milk that is liquid, powdered, or evaporated. Buttermilk that is made with low-fat milk. Nonfat or low-fat yogurt. Beverages  Mineral water. Diet carbonated beverages. Sweets and Desserts  Sherbets and fruit ices. Honey, jam, marmalade, jelly, and syrups. Meringues and gelatins. Pure sugar candy, such as hard candy, jelly beans, gumdrops, mints, marshmallows, and small amounts of dark chocolate. [employer] Eat all sweets and desserts in moderation. Fats and Oils  Nonhydrogenated (trans-free) margarines. Vegetable oils, including soybean, sesame, sunflower, olive, peanut, safflower, corn, canola, and cottonseed. Salad dressings or mayonnaise that are made with a vegetable oil. Limit added fats and oils that you use for cooking, baking, salads, and as spreads. Other  Cocoa powder. Coffee and tea. All seasonings and condiments. The items listed above may not be a complete list of recommended foods or beverages. Contact your dietitian for more options.  What foods are not recommended? Grains  Breads that are made with saturated or trans fats, oils, or whole milk. Croissants. Butter rolls. Cheese breads. Sweet rolls. Donuts. Buttered popcorn. Chow mein noodles. High-fat crackers, such as cheese or butter crackers. Meats and Other Protein Sources  Fatty meats, such as hotdogs, short ribs, sausage, spareribs, bacon, ribeye roast or steak, and mutton. High-fat deli meats, such as salami and bologna. Caviar. Domestic duck and goose. Organ meats, such as kidney, liver, sweetbreads, brains, gizzard, chitterlings, and heart. Dairy  Cream, sour cream, cream cheese, and creamed cottage cheese. Whole milk cheeses, including blue (bleu), Monte[place] Jack, [place], [place], American, [place], Swiss,  [place], [place], and [place]. Whole or 2% milk that is liquid, evaporated, or condensed. Whole buttermilk. Cream sauce or high-fat cheese sauce. Yogurt that is made from whole milk. Beverages  Regular sodas and drinks with added sugar. Sweets and Desserts  Frosting. Pudding. Cookies. Cakes other than angel food cake. Candy that has milk chocolate or white chocolate, hydrogenated fat, butter, coconut, or unknown ingredients. Buttered syrups. Full-fat ice cream or ice cream drinks. Fats and Oils  Gravy that has suet, meat fat, or shortening. Cocoa butter, hydrogenated oils, palm oil, coconut oil, palm kernel oil. These can often be found in baked products, candy, fried foods, nondairy creamers, and whipped toppings. Solid fats and shortenings, including bacon fat, salt pork, lard, and butter. Nondairy cream substitutes, such as coffee creamers and sour cream substitutes. Salad dressings that are made of unknown oils, cheese, or sour cream. The items listed above may not be a complete list of foods and beverages to avoid. Contact your dietitian for more information.  This information is not intended to  replace advice given to you by your health care provider. Make sure you discuss any questions you have with your health care provider. Document Released: 05/30/2008 Document Revised: 03/10/2016 Document Reviewed: 02/12/2014 Elsevier Interactive Patient Education  2017 [name].   Increase water intake and follow heart healthy diet. Please call GI and share that diarrhea has not improved and she needs iron supplementation. Will send information on local water aerobics to help reduce pain and improve mobility. Please check BP at home and if <90/60, please call clinic. Return in 6 months, sooner if needed.

## 2017-01-23 NOTE — Assessment & Plan Note (Addendum)
Discussed re-starting PT-she declined. Discussed beginning water aerobics to improve mobility and reduce pain. PT [facility] therapy order placed. Discussed weaning narcotics, she vehemently declined. Discussed that regular movement (i.e. Walking every hr, stretching exercises in chair) and improved diet/weight loss (increase water, heart healthy diet) will reduce pain and improve overall health.

## 2017-01-23 NOTE — Assessment & Plan Note (Signed)
Continue with Neurology and current medication regime.

## 2017-01-23 NOTE — Assessment & Plan Note (Addendum)
Attempted to start INT for fluid bolus-unable pass INT-please see procedure note from Dr. Raliegh Scarlet. BP recheck 1021/60, pallor and speech improved. Discussed that her polypharmacy and poor water intake is contributing to hypotension. Instructed to increase water intake and reduce narcotics. Discussed weaning narcotics-she vehemently declined.

## 2017-01-23 NOTE — Assessment & Plan Note (Signed)
F/u with GI  

## 2017-01-23 NOTE — Assessment & Plan Note (Signed)
Please call GI and share that first round of IBS tx failed. Also ask about low iron and replacement options that will not worsen existing diarrhea.

## 2017-01-24 DIAGNOSIS — D0462 Carcinoma in situ of skin of left upper limb, including shoulder: Secondary | ICD-10-CM | POA: Diagnosis not present

## 2017-02-05 ENCOUNTER — Other Ambulatory Visit: Payer: Self-pay

## 2017-02-05 MED ORDER — LEVOTHYROXINE SODIUM 100 MCG PO TABS: 100.0000 ug | ORAL_TABLET | Freq: Every day | ORAL | 1 refills | Status: DC

## 2017-02-12 ENCOUNTER — Telehealth: Payer: Self-pay | Admitting: Neurology

## 2017-02-12 NOTE — Telephone Encounter (Signed)
IF patient calls back we call the pharmacy she has another 3 month refill good till end of August 2018. LEft vm detailing they have refills good till end of August. The rx was done in April 2018.No new rx needed.

## 2017-02-12 NOTE — Telephone Encounter (Signed)
Rn call pharmacy. They stated had a 3 month supply order in April. Pt has a refill waiting on her. IF patient picks refill up she is good till end of August.

## 2017-02-12 NOTE — Telephone Encounter (Signed)
Pt's husband said another neurologist was filling RX for pregabalin (LYRICA) 150 MG capsule , Dr Krista Blue said she would do it. He is requesting refill to be sent to [facility], 3 mth supply

## 2017-02-28 ENCOUNTER — Other Ambulatory Visit: Payer: Medicare Other

## 2017-02-28 ENCOUNTER — Ambulatory Visit: Payer: Medicare Other

## 2017-03-12 ENCOUNTER — Ambulatory Visit
Admission: RE | Admit: 2017-03-12 | Discharge: 2017-03-12 | Disposition: A | Discharge status: Home / Self Care | Payer: Medicare Other | Source: Ambulatory Visit | Attending: Adult Health | Admitting: Adult Health

## 2017-03-12 DIAGNOSIS — M810 Age-related osteoporosis without current pathological fracture: Secondary | ICD-10-CM | POA: Diagnosis not present

## 2017-03-12 DIAGNOSIS — Z780 Asymptomatic menopausal state: Secondary | ICD-10-CM | POA: Diagnosis not present

## 2017-03-12 DIAGNOSIS — Z1239 Encounter for other screening for malignant neoplasm of breast: Secondary | ICD-10-CM

## 2017-03-12 DIAGNOSIS — Z1231 Encounter for screening mammogram for malignant neoplasm of breast: Secondary | ICD-10-CM | POA: Diagnosis not present

## 2017-03-13 ENCOUNTER — Other Ambulatory Visit: Payer: Self-pay | Admitting: Adult Health

## 2017-03-13 DIAGNOSIS — R928 Other abnormal and inconclusive findings on diagnostic imaging of breast: Secondary | ICD-10-CM

## 2017-03-19 ENCOUNTER — Other Ambulatory Visit: Payer: Medicare Other

## 2017-03-20 ENCOUNTER — Other Ambulatory Visit: Payer: Medicare Other

## 2017-03-27 ENCOUNTER — Other Ambulatory Visit: Payer: Medicare Other

## 2017-04-02 ENCOUNTER — Ambulatory Visit: Payer: Medicare Other | Admitting: Neurology

## 2017-04-02 ENCOUNTER — Telehealth: Payer: Self-pay | Admitting: *Deleted

## 2017-04-02 NOTE — Telephone Encounter (Signed)
No showed follow up appointment. 

## 2017-04-03 ENCOUNTER — Encounter: Payer: Self-pay | Admitting: Neurology

## 2017-04-23 ENCOUNTER — Other Ambulatory Visit: Payer: Medicare Other

## 2017-05-08 ENCOUNTER — Other Ambulatory Visit: Payer: Medicare Other

## 2017-06-11 ENCOUNTER — Telehealth: Payer: Self-pay | Admitting: *Deleted

## 2017-06-11 ENCOUNTER — Encounter: Payer: Self-pay | Admitting: Neurology

## 2017-06-11 ENCOUNTER — Ambulatory Visit (INDEPENDENT_AMBULATORY_CARE_PROVIDER_SITE_OTHER): Payer: Medicare Other | Admitting: Neurology

## 2017-06-11 VITALS — BP 94/62 | HR 60 | Ht 65.0 in | Wt 191.5 lb

## 2017-06-11 DIAGNOSIS — I6300 Cerebral infarction due to thrombosis of unspecified precerebral artery: Secondary | ICD-10-CM

## 2017-06-11 DIAGNOSIS — G43911 Migraine, unspecified, intractable, with status migrainosus: Secondary | ICD-10-CM | POA: Diagnosis not present

## 2017-06-11 MED ORDER — METHYLPREDNISOLONE 4 MG PO TBPK: ORAL_TABLET | ORAL | 0 refills | Status: DC

## 2017-06-11 MED ORDER — ERENUMAB-AOOE 70 MG/ML ~~LOC~~ SOAJ: 70.0000 mg | SUBCUTANEOUS | 6 refills | Status: DC

## 2017-06-11 MED ORDER — TOPIRAMATE 100 MG PO TABS: 100.0000 mg | ORAL_TABLET | Freq: Every day | ORAL | 4 refills | Status: DC

## 2017-06-11 MED ORDER — ONDANSETRON HCL 4 MG PO TABS: 4.0000 mg | ORAL_TABLET | Freq: Three times a day (TID) | ORAL | 11 refills | Status: DC | PRN

## 2017-06-11 NOTE — Patient Instructions (Signed)
You may try combination of  NSAIDs such as diclofenac, even 2 or 3 tablets of over-the-counter Aleve  Together with Zofran for nausea Tizanidine as muscle relaxant Benadryl to promote sleep  ,

## 2017-06-11 NOTE — Progress Notes (Addendum)
PATIENT: [name] DOB: [birth year]  Chief Complaint  Patient presents with  . Memory Loss    MMSE 22/30 - 6 animals. She is here with her husband, [name]. Reports her memory has continued to decline.  They would like to review her brain MRI.  . Gait Difficulty    She is using a rolling walker to assist with gait.  They would like to review her MRI results.  . Migraine    Feels there is no improvement in migraines with the addition of Topamax.     HISTORICAL  [name] is a 71 years old right-handed female, accompanied by her husband [name],  seen in refer by her primary care physician nurse practitioner [name] for evaluation of migraine, memory loss, initial evaluation was November 06 2016.  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  She reported lifelong history of migraine headaches, her typical migraine are right side severe pounding headache with associated light noise sensitivity, nauseous, lasting for 3 days, she was previously treated by neurologist at [facility] with Lyrica 400 mg daily, tizanidine Frova as needed, oxycodone 20 mg 4 times a day,  Her physician changed at the beginning of the year, she was tapered off oxycodone, since then, she complained of frequent headaches, about once a week severe right side migraine  headache, lasting for 3 days, has to go to emergency room,  She has chronic gait abnormality due to right knee pain, she had right knee replacement, left knee severe arthritis disease.B12 level was 1300,  Update December 05 2016: Reviewed laboratory evaluation on November 06 2016,no significant abnormalitynCMP, C-reactive protein, folic acid, negative RPR, We personally reviewed MRI of the brain without contrast on November 19 2016: Moderate cortical atrophy, no significant change compared to previous scan in 2011, chronic bilateral parietal lobe encephalomalacia, chronic hemo-product on the left side, chronic microvascular ischemic changes in the cerebral hemisphere, and in the left cerebellar hemisphere,  She is taking Diclofenac prn for her headaches. She complains almost daily bilateral retro-orbital area pressure pain, lasting for a few hours,  She also complains of increased, gradual worsening gait abnormality, urinary incontinence.  UPDATE Jun 11 2017: She complains of constant headache,  lateralized severe pounding headache, lasting for 2 days, she is now taking frequent almost daily diclofenac without much help, she is also taking Topamax 100 mg every day at nighttime as preventive medication, which did not make much difference,  She has tried BOTOX Injection in the past, without help, she is not a candidate for triptan treatment because evidence of previous stroke, multiple vascular risk factors  MRI of cervical in April 2018 showed evidence of multilevel degenerative changes, most severe at C4-5, C5-6, with evidence of disc bulging, severe bilateral foraminal narrowing  REVIEW OF SYSTEMS: Full 14 system review of systems performed and notable only for  as above  ALLERGIES: Allergies  Allergen Reactions  . Bupropion Hcl     REACTION: seizure  . Dairy Aid [Lactase] Diarrhea  . Azithromycin Rash    REACTION: rash  . Erythromycin Rash    REACTION: rash    HOME MEDICATIONS: Current  Outpatient Prescriptions  Medication Sig Dispense Refill  . aspirin 500 MG EC tablet Take 500 mg by mouth daily as needed for pain.    . Cholecalciferol (VITAMIN D-3) 1000 units CAPS Take 1,000 Units by mouth.    . clonazePAM (KLONOPIN) 1 MG tablet Take 1 mg by mouth daily. Per Dr. Toy Care    . Coenzyme Q10 (CO Q-10) 300 MG CAPS Take 1 capsule by mouth daily.      . diclofenac (CATAFLAM) 50 MG tablet Take one tablet twice daily, as needed. #90 to last 90 days. 90 tablet 1  . fluticasone (FLONASE) 50 MCG/ACT nasal spray Place 2 sprays into the nose daily. (Patient taking differently: Place 2 sprays into the nose every evening. ) 16 g 5  . furosemide (LASIX) 20 MG tablet Take 1 tab daily only when needed for lower extremity swelling or increase in weight (Patient taking differently: Take 20 mg by mouth daily as needed for fluid. Take 1 tab daily only when needed for lower extremity swelling or increase in weight) 30 tablet 0  . levothyroxine (SYNTHROID, LEVOTHROID) 100 MCG tablet Take 1 tablet (100 mcg total) by mouth daily. 90 tablet 1  . Magnesium 400 MG TABS Take by mouth 2 (two) times daily.    . Multiple Vitamins-Minerals (CENTRUM SILVER PO) Take 1 tablet by mouth daily.     . ondansetron (ZOFRAN) 4 MG tablet Take 4 mg by mouth every 8 (eight) hours as needed. for migraine per Dr. Collene Mares    . oxyCODONE (ROXICODONE) 15 MG immediate release tablet Take 1 tablet (15 mg total) by mouth every 4 (four) hours as needed for pain. 40 tablet 0  . pregabalin (LYRICA) 150 MG capsule Take 1 capsule (150 mg total) by mouth 3 (three) times daily. 270 capsule 1  . S-Adenosylmethionine (MOOD PLUS SAM-E DOUBLE ST) 400 MG TBEC Take 1 tablet by mouth daily.    Marland Kitchen tiZANidine (ZANAFLEX) 4 MG tablet Take 0.5-1 tablet BID PRN. 180 tablet 1  . topiramate (TOPAMAX) 100 MG tablet Take 1 tablet (100 mg total) by mouth at bedtime. 90 tablet 3  . TraZODone & Diet Manage Prod (TRAZAMINE PO) Take 50 mg by mouth 3 (three) times daily  as needed.    . Turmeric (RA TURMERIC) 500 MG CAPS Take 500 mg by mouth 2 (two) times daily.    Marland Kitchen vortioxetine HBr (TRINTELLIX) 20 MG TABS Take 20 mg by mouth daily.     No current facility-administered medications for this visit.     PAST MEDICAL HISTORY: Past Medical History:  Diagnosis Date  . Anemia    took Fe- orally 2 yrs. ago   . Anxiety    panic attacks   . Arthritis    shoulders, knees  . Cancer ([place])    skin- preCA  . Depression   . GERD (gastroesophageal reflux disease)    uses Protonix as needed   . Headache(784.0)   . Humerus fracture    Right   . Hyposmolality and/or hyponatremia 02/27/2013  . Hypothyroidism   . IBS (irritable bowel syndrome)   . Memory loss   . Mental disorder   . Migraine   . Preoperative examination 02/26/2013  .  Seizures ([place])    caused by depression , seritonin seizure - effected short term memory    . Shortness of breath     PAST SURGICAL HISTORY: Past Surgical History:  Procedure Laterality Date  . ABDOMINAL HYSTERECTOMY    . APPENDECTOMY    . BACK SURGERY  1990's  . BREAST SURGERY  1972   augmentation   . CHOLECYSTECTOMY    . GASTRECTOMY  1994   Due to ulcer, required multiple revisions.   Marland Kitchen HERNIA REPAIR    . ORIF ANKLE FRACTURE Right 2004  . REPLACEMENT TOTAL KNEE    . REVERSE SHOULDER ARTHROPLASTY Right 05/16/2013   Procedure: RIGHT HEMI ARTHROPLASTY  VERSES  REVERSE TOTAL SHOULDER ARTHROPLASTY ;  Surgeon: Augustin Schooling, MD;  Location: [facility];  Service: Orthopedics;  Laterality: Right;  . SPINE SURGERY  2005   Lumbar spinal stenosis   . STOMACH SURGERY     1/3 resection for obstruction  . TONSILLECTOMY    . TUBAL LIGATION      FAMILY HISTORY: Family History  Problem Relation Age of Onset  . Aneurysm Mother   . Heart disease Mother   . Cancer Father        pancreatic  . Stroke Father 23  . Hypertension Father   . Alcohol abuse Father   . Heart disease Maternal Grandfather     SOCIAL HISTORY:  Social  History   Social History  . Marital status: Married    Spouse name: N/A  . Number of children: 1  . Years of education: College   Occupational History  . Retired    Social History Main Topics  . Smoking status: Never Smoker  . Smokeless tobacco: Never Used  . Alcohol use No     Comment: rare use of white wine   . Drug use: No  . Sexual activity: Not on file   Other Topics Concern  . Not on file   Social History Narrative   Lives at home with her husband.   2 cups caffeine daily.   Right-handed.     PHYSICAL EXAM   Vitals:   06/11/17 1648  BP: 94/62  Pulse: 60  Weight: 191 lb 8 oz (86.9 kg)  Height: 5\' 5"  (1.651 m)    Not recorded      Body mass index is 31.87 kg/m.  PHYSICAL EXAMNIATION:  Gen: NAD, conversant, well nourised, obese, well groomed                     Cardiovascular: Regular rate rhythm, no peripheral edema, warm, nontender. Eyes: Conjunctivae clear without exudates or hemorrhage Neck: Supple, no carotid bruits. Pulmonary: Clear to auscultation bilaterally   NEUROLOGICAL EXAM:  MMSE - Mini Mental State Exam 06/11/2017 11/06/2016  Orientation to time 1 3  Orientation to Place 4 4  Registration 3 3  Attention/ Calculation 3 2  Recall 2 2  Language- name 2 objects 2 2  Language- repeat 1 1  Language- follow 3 step command 3 3  Language- read & follow direction 1 1  Write a sentence 1 1  Copy design 1 0  Total score 22 22  animal naming 6   CRANIAL NERVES: CN II: Visual fields are full to confrontation. Fundoscopic exam is normal with sharp discs and no vascular changes. Pupils are round equal and briskly reactive to light. CN III, IV, VI: extraocular movement are normal. No ptosis. CN V: Facial sensation is intact to pinprick in  all 3 divisions bilaterally. Corneal responses are intact.  CN VII: Face is symmetric with normal eye closure and smile. CN VIII: Hearing is normal to rubbing fingers CN IX, X: Palate elevates symmetrically.  Phonation is normal. CN XI: Head turning and shoulder shrug are intact CN XII: Tongue is midline with normal movements and no atrophy.  MOTOR: There is no pronator drift of out-stretched arms. Muscle bulk and tone are normal. Muscle strength is normal.  REFLEXES: Reflexes are 2+ and symmetric at the biceps, triceps, knees, and ankles. Plantar responses are flexor.  SENSORY: Intact to light touch, pinprick, positional sensation and vibratory sensation are intact in fingers and toes.  COORDINATION: Rapid alternating movements and fine finger movements are intact. There is no dysmetria on finger-to-nose and heel-knee-shin.    GAIT/STANCE: She needs push up to get up from seated position, antalgic, unsteady, allow her walker,  DIAGNOSTIC DATA (LABS, IMAGING, TESTING) - I reviewed patient records, labs, notes, testing and imaging myself where available.   ASSESSMENT AND PLAN  NASIYAH LAVERDIERE is a 71 y.o. 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  NSAIDs, along with zofran, tizanidine, benadryl for abortive treatment  Gait abnormality, urinary incontinence, chronic neck pain  MRI of cervical spine in April 2018 showed prominent spondylitic changes at C4-5, C5-6 with disc, mild canal, severe bilateral foraminal narrowing,     Marcial Pacas, M.D. Ph.D.  [facility] [address] [place], [place] [address] Ph: [phone] Fax: [phone]  CC: [name], NP

## 2017-06-11 NOTE — Telephone Encounter (Addendum)
Patient arrived at 4:10 for 4pm appt.

## 2017-06-12 ENCOUNTER — Telehealth: Payer: Self-pay

## 2017-06-12 DIAGNOSIS — M5441 Lumbago with sciatica, right side: Secondary | ICD-10-CM | POA: Diagnosis not present

## 2017-06-12 DIAGNOSIS — G8929 Other chronic pain: Secondary | ICD-10-CM | POA: Diagnosis not present

## 2017-06-12 DIAGNOSIS — G894 Chronic pain syndrome: Secondary | ICD-10-CM | POA: Diagnosis not present

## 2017-06-12 NOTE — Telephone Encounter (Signed)
Completed prior auth for Aimovig, sent to [facility]. Should have determination in 3-5 business days.

## 2017-06-13 ENCOUNTER — Encounter: Payer: Self-pay | Admitting: *Deleted

## 2017-06-13 ENCOUNTER — Other Ambulatory Visit: Payer: Self-pay | Admitting: *Deleted

## 2017-06-13 ENCOUNTER — Telehealth: Payer: Self-pay | Admitting: *Deleted

## 2017-06-13 MED ORDER — VENLAFAXINE HCL 37.5 MG PO TABS: 37.5000 mg | ORAL_TABLET | Freq: Two times a day (BID) | ORAL | 11 refills | Status: DC

## 2017-06-13 MED ORDER — DICLOFENAC POTASSIUM 50 MG PO TABS: ORAL_TABLET | ORAL | 1 refills | Status: DC

## 2017-06-13 MED ORDER — TOPIRAMATE 100 MG PO TABS: 100.0000 mg | ORAL_TABLET | Freq: Two times a day (BID) | ORAL | 4 refills | Status: DC

## 2017-06-13 NOTE — Telephone Encounter (Signed)
Duplicate task - please see telephone note on 06/12/17.

## 2017-06-13 NOTE — Telephone Encounter (Signed)
PA denied by [facility].  Letter stated she has to try the preferred medications: divalproex delayed release, propranolol, metoprolol and venlafaxine. Dr. Krista Blue has reviewed her chart.  Due to her low BP, she is not a good candidate for the beta blockers.  Per vo by Dr. Krista Blue, provide rx for venlafaxine 37.5mg , one tab BID to replace Aimovig prescription.  Spoke to her husband (on HIPAA) - he is aware and agreeable to this therapy change.

## 2017-06-18 ENCOUNTER — Telehealth: Payer: Self-pay | Admitting: Adult Health

## 2017-06-18 NOTE — Telephone Encounter (Signed)
Call transferred to [facility] to discuss with pt's son.  Charyl Bigger, CMA

## 2017-06-18 NOTE — Telephone Encounter (Signed)
06/18/17 patient's son/ Vida Roller called to speak w/ PCP or nurse (would not disclose reason)--- Advised both were in room with a patient & unavailable to speak with him-- Pt's son requested a call back at [phone]. --glh

## 2017-06-19 ENCOUNTER — Other Ambulatory Visit: Payer: Self-pay | Admitting: Adult Health

## 2017-06-19 ENCOUNTER — Telehealth: Payer: Self-pay

## 2017-06-19 ENCOUNTER — Telehealth: Payer: Self-pay | Admitting: Adult Health

## 2017-06-19 ENCOUNTER — Other Ambulatory Visit: Payer: Medicare Other

## 2017-06-19 DIAGNOSIS — I5030 Unspecified diastolic (congestive) heart failure: Secondary | ICD-10-CM

## 2017-06-19 DIAGNOSIS — R269 Unspecified abnormalities of gait and mobility: Secondary | ICD-10-CM

## 2017-06-19 DIAGNOSIS — G8929 Other chronic pain: Secondary | ICD-10-CM

## 2017-06-19 DIAGNOSIS — R413 Other amnesia: Secondary | ICD-10-CM

## 2017-06-19 DIAGNOSIS — I959 Hypotension, unspecified: Secondary | ICD-10-CM

## 2017-06-19 NOTE — Telephone Encounter (Signed)
Patient's son [name] has additional questions/conerns and would like to be called on his mobile [phone])

## 2017-06-19 NOTE — Telephone Encounter (Signed)
Esaw Grandchild, NP  Fonnie Mu, CMA        Morning Kenney Houseman,  Can you please call Mr. Avon Molock (pt's son [phone])  And tell him I placed [place].  I am aware that she is having mammogram/US today and we will be looking out for those results.  Please tell him to contact us with any additional concerns/questions.  I do not see any future appt's for Ms. Levitan with [place] (despite being called about abnormal Bone Density and needs regular f/u at min ever 6 months). Can you please call Ms. Thelen and have her make a regular f/u appt.  I see that she has been a no-show to several of her last Neurology appt's as well.  Also has she been in contact with GI/Dr, Collene Mares as advised?  Thanks!  Valetta Fuller     Discussed with pt's son.  Vida Roller expressed understanding and is agreeable.  Charyl Bigger, CMA
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
## 2017-06-20 NOTE — Telephone Encounter (Signed)
LVM for pt's son to return call.  T. Nelson, CMA 

## 2017-06-21 NOTE — Telephone Encounter (Signed)
[name], son, stated that we had already called and discussed his questions.  Also advised [name] that Mr. Teas called this morning stating that the pt fell either last night or this morning and they believe she "may have fractured her hip".  [place] instructed husband to take the pt to the ER and pt's son was advised of this.  Charyl Bigger, CMA

## 2017-06-25 ENCOUNTER — Other Ambulatory Visit: Payer: Medicare Other

## 2017-06-26 ENCOUNTER — Telehealth: Payer: Self-pay | Admitting: Adult Health

## 2017-06-26 DIAGNOSIS — R2689 Other abnormalities of gait and mobility: Secondary | ICD-10-CM | POA: Diagnosis not present

## 2017-06-26 DIAGNOSIS — R413 Other amnesia: Secondary | ICD-10-CM | POA: Diagnosis not present

## 2017-06-26 DIAGNOSIS — G8929 Other chronic pain: Secondary | ICD-10-CM | POA: Diagnosis not present

## 2017-06-26 DIAGNOSIS — G43909 Migraine, unspecified, not intractable, without status migrainosus: Secondary | ICD-10-CM | POA: Diagnosis not present

## 2017-06-26 DIAGNOSIS — F329 Major depressive disorder, single episode, unspecified: Secondary | ICD-10-CM | POA: Diagnosis not present

## 2017-06-26 DIAGNOSIS — M6281 Muscle weakness (generalized): Secondary | ICD-10-CM | POA: Diagnosis not present

## 2017-06-26 NOTE — Telephone Encounter (Signed)
10/23 rcvd call from [place] at [facility] states they will be providing care for [name] & request Verbal authorization for OT & PT for ( 2 visit )for the next (3 weeks) & then reduced to 1 visit for 3 weeks there after.  ------ Marlowe Kays states patient had a fall & injured ribs--- Patient also has a possible UTI w/coudy and burning urination.  Please call [phone] to give (Verbal Authorization for services with [facility].  --glh

## 2017-06-26 NOTE — Telephone Encounter (Signed)
Pt authorize PT and OT therapies.  Also, would you like for [facility] to send a urine specimen to the lab if possible to avoid the patient having to come in to the office?  Please advise.  Charyl Bigger, CMA

## 2017-06-27 NOTE — Telephone Encounter (Signed)
Good Morning Tonya, Can you please call Connie/[facility] and authorize the orders for OT and PT. Please have them send urine sample. Please THANK HER for assisting with the care of Ms. Rhome. Thanks! Valetta Fuller

## 2017-06-27 NOTE — Telephone Encounter (Signed)
LVM for Marlowe Kays authorizing OT and PT and requesting that they send urine specimen to lab for testing for UTI.  Charyl Bigger, CMA

## 2017-06-28 ENCOUNTER — Telehealth: Payer: Self-pay | Admitting: Adult Health

## 2017-06-28 NOTE — Telephone Encounter (Signed)
Flonnie Hailstone from [facility] states they have been unable to speak w/Patient's family regarding the OT (occupational therapy)-- [facility] wishes to delay until PT complete, they are requesting PCP give (Verbal Authorization to delay)  Please call Flonnie Hailstone at [phone]  --glh

## 2017-06-28 NOTE — Telephone Encounter (Signed)
Per Mina Marble, NP, ok to delay OT until PT is completed.  Provided Remo Lipps with pt's son's phone number.  Charyl Bigger, CMA

## 2017-06-29 ENCOUNTER — Other Ambulatory Visit: Payer: Self-pay | Admitting: Neurology

## 2017-06-29 ENCOUNTER — Telehealth: Payer: Self-pay | Admitting: Adult Health

## 2017-06-29 NOTE — Telephone Encounter (Signed)
LVM for [name] giving verbal order to do UA next week.  Advised her to advise the family that if she develops any signs/sxs of pyelonephritis or sepsis to proceed to ER immediately.  Charyl Bigger, CMA

## 2017-06-29 NOTE — Telephone Encounter (Signed)
[name] had complained of burning sensation with urinating and HH was going out today to do a UA but husband called and c/x appt. [name] the [facility] nurse needs a verbal order to go out next week to perform UA. She can be reached at [phone]

## 2017-06-29 NOTE — Telephone Encounter (Signed)
Verbal authorization given to start PT next week rather than this week.  Charyl Bigger, CMA

## 2017-06-29 NOTE — Telephone Encounter (Signed)
[place] from [facility] needs verbal orders to move PT for [place] to next week. She can be reached at [phone]

## 2017-07-03 ENCOUNTER — Other Ambulatory Visit: Payer: Medicare Other

## 2017-07-04 DIAGNOSIS — R413 Other amnesia: Secondary | ICD-10-CM | POA: Diagnosis not present

## 2017-07-04 DIAGNOSIS — G8929 Other chronic pain: Secondary | ICD-10-CM | POA: Diagnosis not present

## 2017-07-04 DIAGNOSIS — M6281 Muscle weakness (generalized): Secondary | ICD-10-CM | POA: Diagnosis not present

## 2017-07-04 DIAGNOSIS — N390 Urinary tract infection, site not specified: Secondary | ICD-10-CM | POA: Diagnosis not present

## 2017-07-04 DIAGNOSIS — G43909 Migraine, unspecified, not intractable, without status migrainosus: Secondary | ICD-10-CM | POA: Diagnosis not present

## 2017-07-04 DIAGNOSIS — F329 Major depressive disorder, single episode, unspecified: Secondary | ICD-10-CM | POA: Diagnosis not present

## 2017-07-04 DIAGNOSIS — R2689 Other abnormalities of gait and mobility: Secondary | ICD-10-CM | POA: Diagnosis not present

## 2017-07-06 ENCOUNTER — Telehealth: Payer: Self-pay | Admitting: Adult Health

## 2017-07-06 DIAGNOSIS — G8929 Other chronic pain: Secondary | ICD-10-CM | POA: Diagnosis not present

## 2017-07-06 DIAGNOSIS — F329 Major depressive disorder, single episode, unspecified: Secondary | ICD-10-CM | POA: Diagnosis not present

## 2017-07-06 DIAGNOSIS — R413 Other amnesia: Secondary | ICD-10-CM | POA: Diagnosis not present

## 2017-07-06 DIAGNOSIS — R2689 Other abnormalities of gait and mobility: Secondary | ICD-10-CM | POA: Diagnosis not present

## 2017-07-06 DIAGNOSIS — M6281 Muscle weakness (generalized): Secondary | ICD-10-CM | POA: Diagnosis not present

## 2017-07-06 DIAGNOSIS — G43909 Migraine, unspecified, not intractable, without status migrainosus: Secondary | ICD-10-CM | POA: Diagnosis not present

## 2017-07-06 NOTE — Telephone Encounter (Signed)
Rcvd call frm [facility] PT/ [place], she request Verbal authorization to continue PT for 2 times weekly for the next 4 weeks-- Pls call [phone].  --glh

## 2017-07-09 NOTE — Telephone Encounter (Signed)
LVM for [place] authorizing continued PT.  Charyl Bigger, CMA

## 2017-07-10 ENCOUNTER — Other Ambulatory Visit: Payer: Self-pay

## 2017-07-10 ENCOUNTER — Encounter (HOSPITAL_COMMUNITY): Payer: Self-pay | Admitting: Emergency Medicine

## 2017-07-10 ENCOUNTER — Emergency Department (HOSPITAL_COMMUNITY)
Admission: EM | Admit: 2017-07-10 | Discharge: 2017-07-11 | Disposition: A | Discharge status: Home / Self Care | Payer: Medicare Other | Attending: Emergency Medicine | Admitting: Emergency Medicine

## 2017-07-10 ENCOUNTER — Other Ambulatory Visit: Payer: Medicare Other

## 2017-07-10 ENCOUNTER — Emergency Department (HOSPITAL_COMMUNITY): Payer: Medicare Other

## 2017-07-10 DIAGNOSIS — E039 Hypothyroidism, unspecified: Secondary | ICD-10-CM | POA: Diagnosis not present

## 2017-07-10 DIAGNOSIS — R110 Nausea: Secondary | ICD-10-CM | POA: Diagnosis not present

## 2017-07-10 DIAGNOSIS — R42 Dizziness and giddiness: Secondary | ICD-10-CM | POA: Diagnosis not present

## 2017-07-10 DIAGNOSIS — I5032 Chronic diastolic (congestive) heart failure: Secondary | ICD-10-CM | POA: Insufficient documentation

## 2017-07-10 DIAGNOSIS — Z7982 Long term (current) use of aspirin: Secondary | ICD-10-CM | POA: Insufficient documentation

## 2017-07-10 DIAGNOSIS — R079 Chest pain, unspecified: Secondary | ICD-10-CM | POA: Diagnosis not present

## 2017-07-10 DIAGNOSIS — Z79899 Other long term (current) drug therapy: Secondary | ICD-10-CM | POA: Diagnosis not present

## 2017-07-10 LAB — I-STAT TROPONIN, ED: Troponin i, poc: 0.03 ng/mL (ref 0.00–0.08)

## 2017-07-10 LAB — CBC
HCT: 34.4 % — ABNORMAL LOW (ref 36.0–46.0)
Hemoglobin: 11.2 g/dL — ABNORMAL LOW (ref 12.0–15.0)
MCH: 24.5 pg — ABNORMAL LOW (ref 26.0–34.0)
MCHC: 32.6 g/dL (ref 30.0–36.0)
MCV: 75.1 fL — ABNORMAL LOW (ref 78.0–100.0)
Platelets: 148 10*3/uL — ABNORMAL LOW (ref 150–400)
RBC: 4.58 MIL/uL (ref 3.87–5.11)
RDW: 17.8 % — ABNORMAL HIGH (ref 11.5–15.5)
WBC: 6.4 10*3/uL (ref 4.0–10.5)

## 2017-07-10 LAB — BASIC METABOLIC PANEL
Anion gap: 6 (ref 5–15)
BUN: 9 mg/dL (ref 6–20)
CO2: 22 mmol/L (ref 22–32)
Calcium: 8.1 mg/dL — ABNORMAL LOW (ref 8.9–10.3)
Chloride: 107 mmol/L (ref 101–111)
Creatinine, Ser: 1.02 mg/dL — ABNORMAL HIGH (ref 0.44–1.00)
GFR calc Af Amer: >60 mL/min (ref >60)
GFR calc non Af Amer: 53 mL/min — ABNORMAL LOW (ref >60)
Glucose, Bld: 97 mg/dL (ref 65–99)
Potassium: 3.9 mmol/L (ref 3.5–5.1)
Sodium: 135 mmol/L (ref 135–145)

## 2017-07-10 NOTE — ED Triage Notes (Signed)
Pt was arguing w/ husband and began to have right sided chest pain going to the right arm and dizziness.  Pt was found to have a bp 100/72 hr 57 upon sitting but upon standing 88/50 hr of 90.   She was given 700ns bp went to 138/90.  Pt took two tablets of ASA but does not know strength.
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
## 2017-07-11 LAB — TROPONIN I: Troponin I: <0.03 ng/mL (ref <0.03)

## 2017-07-12 ENCOUNTER — Emergency Department (HOSPITAL_COMMUNITY)
Admission: EM | Admit: 2017-07-12 | Discharge: 2017-07-13 | Disposition: A | Discharge status: Home / Self Care | Payer: Medicare Other | Attending: Emergency Medicine | Admitting: Emergency Medicine

## 2017-07-12 ENCOUNTER — Other Ambulatory Visit: Payer: Self-pay

## 2017-07-12 ENCOUNTER — Encounter (HOSPITAL_COMMUNITY): Payer: Self-pay

## 2017-07-12 DIAGNOSIS — Y929 Unspecified place or not applicable: Secondary | ICD-10-CM | POA: Diagnosis not present

## 2017-07-12 DIAGNOSIS — R0789 Other chest pain: Secondary | ICD-10-CM | POA: Diagnosis not present

## 2017-07-12 DIAGNOSIS — Z8673 Personal history of transient ischemic attack (TIA), and cerebral infarction without residual deficits: Secondary | ICD-10-CM | POA: Diagnosis not present

## 2017-07-12 DIAGNOSIS — Y999 Unspecified external cause status: Secondary | ICD-10-CM | POA: Diagnosis not present

## 2017-07-12 DIAGNOSIS — Z79899 Other long term (current) drug therapy: Secondary | ICD-10-CM | POA: Insufficient documentation

## 2017-07-12 DIAGNOSIS — Z7982 Long term (current) use of aspirin: Secondary | ICD-10-CM | POA: Diagnosis not present

## 2017-07-12 DIAGNOSIS — E039 Hypothyroidism, unspecified: Secondary | ICD-10-CM | POA: Insufficient documentation

## 2017-07-12 DIAGNOSIS — S2231XA Fracture of one rib, right side, initial encounter for closed fracture: Secondary | ICD-10-CM | POA: Diagnosis not present

## 2017-07-12 DIAGNOSIS — R109 Unspecified abdominal pain: Secondary | ICD-10-CM | POA: Diagnosis not present

## 2017-07-12 DIAGNOSIS — R10811 Right upper quadrant abdominal tenderness: Secondary | ICD-10-CM | POA: Diagnosis not present

## 2017-07-12 DIAGNOSIS — Y939 Activity, unspecified: Secondary | ICD-10-CM | POA: Diagnosis not present

## 2017-07-12 DIAGNOSIS — S3991XA Unspecified injury of abdomen, initial encounter: Secondary | ICD-10-CM | POA: Diagnosis not present

## 2017-07-12 DIAGNOSIS — S299XXA Unspecified injury of thorax, initial encounter: Secondary | ICD-10-CM | POA: Diagnosis present

## 2017-07-12 LAB — CBC WITH DIFFERENTIAL/PLATELET
BASOS ABS: 0 10*3/uL (ref 0.0–0.1)
BASOS PCT: 0 %
EOS ABS: 0.2 10*3/uL (ref 0.0–0.7)
EOS PCT: 2 %
HCT: 41.7 % (ref 36.0–46.0)
Hemoglobin: 13.3 g/dL (ref 12.0–15.0)
Lymphocytes Relative: 25 %
Lymphs Abs: 3.2 10*3/uL (ref 0.7–4.0)
MCH: 24.5 pg — ABNORMAL LOW (ref 26.0–34.0)
MCHC: 31.9 g/dL (ref 30.0–36.0)
MCV: 76.9 fL — ABNORMAL LOW (ref 78.0–100.0)
MONO ABS: 1 10*3/uL (ref 0.1–1.0)
Monocytes Relative: 8 %
NEUTROS ABS: 8.4 10*3/uL — AB (ref 1.7–7.7)
Neutrophils Relative %: 66 %
PLATELETS: 136 10*3/uL — AB (ref 150–400)
RBC: 5.42 MIL/uL — ABNORMAL HIGH (ref 3.87–5.11)
RDW: 17.8 % — AB (ref 11.5–15.5)
WBC: 12.8 10*3/uL — ABNORMAL HIGH (ref 4.0–10.5)

## 2017-07-12 LAB — I-STAT CHEM 8, ED
BUN: 13 mg/dL (ref 6–20)
CALCIUM ION: 0.89 mmol/L — AB (ref 1.15–1.40)
Chloride: 108 mmol/L (ref 101–111)
Creatinine, Ser: 1.2 mg/dL — ABNORMAL HIGH (ref 0.44–1.00)
Glucose, Bld: 78 mg/dL (ref 65–99)
HEMATOCRIT: 40 % (ref 36.0–46.0)
HEMOGLOBIN: 13.6 g/dL (ref 12.0–15.0)
Potassium: 4.4 mmol/L (ref 3.5–5.1)
SODIUM: 132 mmol/L — AB (ref 135–145)
TCO2: 17 mmol/L — AB (ref 22–32)

## 2017-07-12 MED ORDER — HYDROCODONE-ACETAMINOPHEN 5-325 MG PO TABS
1.0000 | ORAL_TABLET | Freq: Once | ORAL | Status: AC
  Administered 2017-07-12: 1 via ORAL
  Filled 2017-07-12: qty 1

## 2017-07-12 NOTE — ED Notes (Signed)
Unable to obtain blood. Raquel Sarna, RN at bedside attempting.

## 2017-07-12 NOTE — ED Notes (Signed)
Unable to draw blood and/ or start line. Second nurse brought in and unable. IV consult put in for line/ blood.
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
## 2017-07-13 ENCOUNTER — Telehealth: Payer: Self-pay | Admitting: Adult Health

## 2017-07-13 ENCOUNTER — Emergency Department (HOSPITAL_COMMUNITY): Payer: Medicare Other

## 2017-07-13 DIAGNOSIS — S3991XA Unspecified injury of abdomen, initial encounter: Secondary | ICD-10-CM | POA: Diagnosis not present

## 2017-07-13 DIAGNOSIS — R109 Unspecified abdominal pain: Secondary | ICD-10-CM | POA: Diagnosis not present

## 2017-07-13 DIAGNOSIS — S2231XA Fracture of one rib, right side, initial encounter for closed fracture: Secondary | ICD-10-CM | POA: Diagnosis not present

## 2017-07-13 DIAGNOSIS — S299XXA Unspecified injury of thorax, initial encounter: Secondary | ICD-10-CM | POA: Diagnosis not present

## 2017-07-13 MED ORDER — MORPHINE SULFATE (PF) 4 MG/ML IV SOLN
4.0000 mg | Freq: Once | INTRAVENOUS | Status: AC
  Administered 2017-07-13: 4 mg via INTRAVENOUS
  Filled 2017-07-13: qty 1

## 2017-07-13 MED ORDER — IOPAMIDOL (ISOVUE-300) INJECTION 61%
INTRAVENOUS | Status: AC
  Administered 2017-07-13: 100 mL
  Filled 2017-07-13: qty 100
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
## 2017-07-13 NOTE — ED Notes (Signed)
Patient transported to CT 
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
## 2017-07-13 NOTE — ED Notes (Signed)
Patient ambulatory and states understanding discharge paperwork

## 2017-07-16 ENCOUNTER — Other Ambulatory Visit: Payer: Medicare Other

## 2017-07-17 NOTE — Telephone Encounter (Signed)
Verbal order given to Flonnie Hailstone with [facility].  However, [name] states that pt's husband has not allowed them to do PT because "she is in too much pain".  Advised [name] that she may delay PT further if needed.  Charyl Bigger, CMA

## 2017-07-24 ENCOUNTER — Ambulatory Visit: Payer: Medicare Other | Admitting: Adult Health
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
## 2017-07-25 ENCOUNTER — Ambulatory Visit: Payer: Medicare Other

## 2017-07-25 ENCOUNTER — Encounter: Payer: Self-pay | Admitting: Adult Health

## 2017-07-25 ENCOUNTER — Other Ambulatory Visit: Payer: Self-pay

## 2017-07-25 ENCOUNTER — Ambulatory Visit (INDEPENDENT_AMBULATORY_CARE_PROVIDER_SITE_OTHER): Payer: Medicare Other | Admitting: Adult Health

## 2017-07-25 VITALS — BP 100/71 | HR 80 | Ht 65.0 in | Wt 183.2 lb

## 2017-07-25 DIAGNOSIS — R0789 Other chest pain: Secondary | ICD-10-CM

## 2017-07-25 DIAGNOSIS — S299XXA Unspecified injury of thorax, initial encounter: Secondary | ICD-10-CM | POA: Diagnosis not present

## 2017-07-25 DIAGNOSIS — I6300 Cerebral infarction due to thrombosis of unspecified precerebral artery: Secondary | ICD-10-CM

## 2017-07-25 DIAGNOSIS — M546 Pain in thoracic spine: Secondary | ICD-10-CM

## 2017-07-25 DIAGNOSIS — R0781 Pleurodynia: Secondary | ICD-10-CM

## 2017-07-25 MED ORDER — OXYCODONE HCL 15 MG PO TABS: 15.0000 mg | ORAL_TABLET | Freq: Every day | ORAL | 0 refills | Status: DC

## 2017-07-25 NOTE — Assessment & Plan Note (Signed)
Chest Xray- negative for acute changes Reviewed 07/12/17 CT results at length Please use OTC Acetaminophen and Ibuprofen and ice for pain control. Please call Dr. Nelva Bush if you continue to have uncontrolled pain. Please use incentive spirometer at least once an hour. Increase water and follow heart healthy diet. Please schedule follow-up appt to address chronic medical conditions in 2-3 weeks.

## 2017-07-25 NOTE — Patient Instructions (Addendum)
Rib Fracture A rib fracture is a break or crack in one of the bones of the ribs. The ribs are like a cage that goes around your upper chest. A broken or cracked rib is often painful, but most do not cause other problems. Most rib fractures heal on their own in 1-3 months. Follow these instructions at home:  Avoid activities that cause pain to the injured area. Protect your injured area.  Slowly increase activity as told by your doctor.  Take medicine as told by your doctor.  Put ice on the injured area for the first 1-2 days after you have been treated or as told by your doctor. ? Put ice in a plastic bag. ? Place a towel between your skin and the bag. ? Leave the ice on for 15-20 minutes at a time, every 2 hours while you are awake.  Do deep breathing as told by your doctor. You may be told to: ? Take deep breaths many times a day. ? Cough many times a day while hugging a pillow. ? Use a device (incentive spirometer) to perform deep breathing many times a day.  Drink enough fluids to keep your pee (urine) clear or pale yellow.  Do not wear a rib belt or binder. These do not allow you to breathe deeply. Get help right away if:  You have a fever.  You have trouble breathing.  You cannot stop coughing.  You cough up thick or bloody spit (mucus).  You feel sick to your stomach (nauseous), throw up (vomit), or have belly (abdominal) pain.  Your pain gets worse and medicine does not help. This information is not intended to replace advice given to you by your health care provider. Make sure you discuss any questions you have with your health care provider. Document Released: 05/30/2008 Document Revised: 01/27/2016 Document Reviewed: 10/23/2012 Elsevier Interactive Patient Education  2018 [employer].   Heart-Healthy Eating Plan Many factors influence your heart health, including eating and exercise habits. Heart (coronary) risk increases with abnormal blood fat (lipid) levels.  Heart-healthy meal planning includes limiting unhealthy fats, increasing healthy fats, and making other small dietary changes. This includes maintaining a healthy body weight to help keep lipid levels within a normal range. What is my plan? Your health care provider recommends that you:  Get no more than __25__% of the total calories in your daily diet from fat.  Limit your intake of saturated fat to less than ___5___% of your total calories each day.  Limit the amount of cholesterol in your diet to less than _300___ mg per day.  What types of fat should I choose?  Choose healthy fats more often. Choose monounsaturated and polyunsaturated fats, such as olive oil and canola oil, flaxseeds, walnuts, almonds, and seeds.  Eat more omega-3 fats. Good choices include salmon, mackerel, sardines, tuna, flaxseed oil, and ground flaxseeds. Aim to eat fish at least two times each week.  Limit saturated fats. Saturated fats are primarily found in animal products, such as meats, butter, and cream. Plant sources of saturated fats include palm oil, palm kernel oil, and coconut oil.  Avoid foods with partially hydrogenated oils in them. These contain trans fats. Examples of foods that contain trans fats are stick margarine, some tub margarines, cookies, crackers, and other baked goods. What general guidelines do I need to follow?  Check food labels carefully to identify foods with trans fats or high amounts of saturated fat.  Fill one half of your plate with  vegetables and green salads. Eat 4-5 servings of vegetables per day. A serving of vegetables equals 1 cup of raw leafy vegetables,  cup of raw or cooked cut-up vegetables, or  cup of vegetable juice.  Fill one fourth of your plate with whole grains. Look for the word "whole" as the first word in the ingredient list.  Fill one fourth of your plate with lean protein foods.  Eat 4-5 servings of fruit per day. A serving of fruit equals one medium  whole fruit,  cup of dried fruit,  cup of fresh, frozen, or canned fruit, or  cup of 100% fruit juice.  Eat more foods that contain soluble fiber. Examples of foods that contain this type of fiber are apples, broccoli, carrots, beans, peas, and barley. Aim to get 20-30 g of fiber per day.  Eat more home-cooked food and less restaurant, buffet, and fast food.  Limit or avoid alcohol.  Limit foods that are high in starch and sugar.  Avoid fried foods.  Cook foods by using methods other than frying. Baking, boiling, grilling, and broiling are all great options. Other fat-reducing suggestions include: ? Removing the skin from poultry. ? Removing all visible fats from meats. ? Skimming the fat off of stews, soups, and gravies before serving them. ? Steaming vegetables in water or broth.  Lose weight if you are overweight. Losing just 5-10% of your initial body weight can help your overall health and prevent diseases such as diabetes and heart disease.  Increase your consumption of nuts, legumes, and seeds to 4-5 servings per week. One serving of dried beans or legumes equals  cup after being cooked, one serving of nuts equals 1 ounces, and one serving of seeds equals  ounce or 1 tablespoon.  You may need to monitor your salt (sodium) intake, especially if you have high blood pressure. Talk with your health care provider or dietitian to get more information about reducing sodium. What foods can I eat? Grains  Breads, including [place], white, pita, wheat, raisin, rye, oatmeal, and [place]. Tortillas that are neither fried nor made with lard or trans fat. Low-fat rolls, including hotdog and hamburger buns and English muffins. Biscuits. Muffins. Waffles. Pancakes. Light popcorn. Whole-grain cereals. Flatbread. Melba toast. Pretzels. Breadsticks. Rusks. Low-fat snacks and crackers, including oyster, saltine, matzo, graham, animal, and rye. Rice and pasta, including brown rice and those that are  made with whole wheat. Vegetables All vegetables. Fruits All fruits, but limit coconut. Meats and Other Protein Sources Lean, well-trimmed beef, veal, pork, and lamb. Chicken and [place] without skin. All fish and shellfish. Wild duck, rabbit, pheasant, and venison. Egg whites or low-cholesterol egg substitutes. Dried beans, peas, lentils, and tofu.Seeds and most nuts. Dairy Low-fat or nonfat cheeses, including ricotta, string, and mozzarella. Skim or 1% milk that is liquid, powdered, or evaporated. Buttermilk that is made with low-fat milk. Nonfat or low-fat yogurt. Beverages Mineral water. Diet carbonated beverages. Sweets and Desserts Sherbets and fruit ices. Honey, jam, marmalade, jelly, and syrups. Meringues and gelatins. Pure sugar candy, such as hard candy, jelly beans, gumdrops, mints, marshmallows, and small amounts of dark chocolate. [employer] Eat all sweets and desserts in moderation. Fats and Oils Nonhydrogenated (trans-free) margarines. Vegetable oils, including soybean, sesame, sunflower, olive, peanut, safflower, corn, canola, and cottonseed. Salad dressings or mayonnaise that are made with a vegetable oil. Limit added fats and oils that you use for cooking, baking, salads, and as spreads. Other Cocoa powder. Coffee and tea. All seasonings  and condiments. The items listed above may not be a complete list of recommended foods or beverages. Contact your dietitian for more options. What foods are not recommended? Grains Breads that are made with saturated or trans fats, oils, or whole milk. Croissants. Butter rolls. Cheese breads. Sweet rolls. Donuts. Buttered popcorn. Chow mein noodles. High-fat crackers, such as cheese or butter crackers. Meats and Other Protein Sources Fatty meats, such as hotdogs, short ribs, sausage, spareribs, bacon, ribeye roast or steak, and mutton. High-fat deli meats, such as salami and bologna. Caviar. Domestic duck and goose. Organ meats, such as  kidney, liver, sweetbreads, brains, gizzard, chitterlings, and heart. Dairy Cream, sour cream, cream cheese, and creamed cottage cheese. Whole milk cheeses, including blue (bleu), Monte[place] Jack, [place], [place], [place], [place], [place], [place], [place], and [place]. Whole or 2% milk that is liquid, evaporated, or condensed. Whole buttermilk. Cream sauce or high-fat cheese sauce. Yogurt that is made from whole milk. Beverages Regular sodas and drinks with added sugar. Sweets and Desserts Frosting. Pudding. Cookies. Cakes other than angel food cake. Candy that has milk chocolate or white chocolate, hydrogenated fat, butter, coconut, or unknown ingredients. Buttered syrups. Full-fat ice cream or ice cream drinks. Fats and Oils Gravy that has suet, meat fat, or shortening. Cocoa butter, hydrogenated oils, palm oil, coconut oil, palm kernel oil. These can often be found in baked products, candy, fried foods, nondairy creamers, and whipped toppings. Solid fats and shortenings, including bacon fat, salt pork, lard, and butter. Nondairy cream substitutes, such as coffee creamers and sour cream substitutes. Salad dressings that are made of unknown oils, cheese, or sour cream. The items listed above may not be a complete list of foods and beverages to avoid. Contact your dietitian for more information. This information is not intended to replace advice given to you by your health care provider. Make sure you discuss any questions you have with your health care provider. Document Released: 05/30/2008 Document Revised: 03/10/2016 Document Reviewed: 02/12/2014 Elsevier Interactive Patient Education  2017 [place].   Chest Xray- negative for acute changes Please use OTC Acetaminophen and Ibuprofen and ice for pain control. Please call Dr. Nelva Bush if you continue to have uncontrolled pain. Please use incentive spirometer at least once an hour. Increase water and follow heart healthy diet. Please schedule  follow-up appt to address chronic medical conditions in 2-3 weeks. FEEL BETTER! NICE TO SEE YOU!
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
## 2017-07-25 NOTE — Progress Notes (Signed)
RX for oxycodone accidentally printed.  Was only changing sig on RX in pt's record.  RX destroyed and witnessed by Mina Marble, NP-C.  Charyl Bigger, CMA

## 2017-07-27 DIAGNOSIS — F329 Major depressive disorder, single episode, unspecified: Secondary | ICD-10-CM | POA: Diagnosis not present

## 2017-07-27 DIAGNOSIS — G8929 Other chronic pain: Secondary | ICD-10-CM | POA: Diagnosis not present

## 2017-07-27 DIAGNOSIS — R2689 Other abnormalities of gait and mobility: Secondary | ICD-10-CM | POA: Diagnosis not present

## 2017-07-27 DIAGNOSIS — R413 Other amnesia: Secondary | ICD-10-CM | POA: Diagnosis not present

## 2017-07-27 DIAGNOSIS — M6281 Muscle weakness (generalized): Secondary | ICD-10-CM | POA: Diagnosis not present

## 2017-07-27 DIAGNOSIS — G43909 Migraine, unspecified, not intractable, without status migrainosus: Secondary | ICD-10-CM | POA: Diagnosis not present

## 2017-08-01 DIAGNOSIS — G8929 Other chronic pain: Secondary | ICD-10-CM | POA: Diagnosis not present

## 2017-08-01 DIAGNOSIS — R2689 Other abnormalities of gait and mobility: Secondary | ICD-10-CM | POA: Diagnosis not present

## 2017-08-01 DIAGNOSIS — M6281 Muscle weakness (generalized): Secondary | ICD-10-CM | POA: Diagnosis not present

## 2017-08-01 DIAGNOSIS — R413 Other amnesia: Secondary | ICD-10-CM | POA: Diagnosis not present

## 2017-08-01 DIAGNOSIS — G43909 Migraine, unspecified, not intractable, without status migrainosus: Secondary | ICD-10-CM | POA: Diagnosis not present

## 2017-08-01 DIAGNOSIS — F329 Major depressive disorder, single episode, unspecified: Secondary | ICD-10-CM | POA: Diagnosis not present

## 2017-08-02 ENCOUNTER — Other Ambulatory Visit: Payer: Self-pay | Admitting: Adult Health

## 2017-08-07 ENCOUNTER — Telehealth: Payer: Self-pay | Admitting: Adult Health

## 2017-08-07 NOTE — Telephone Encounter (Signed)
error 

## 2017-08-08 DIAGNOSIS — F329 Major depressive disorder, single episode, unspecified: Secondary | ICD-10-CM | POA: Diagnosis not present

## 2017-08-08 DIAGNOSIS — R2689 Other abnormalities of gait and mobility: Secondary | ICD-10-CM | POA: Diagnosis not present

## 2017-08-08 DIAGNOSIS — R413 Other amnesia: Secondary | ICD-10-CM | POA: Diagnosis not present

## 2017-08-08 DIAGNOSIS — G8929 Other chronic pain: Secondary | ICD-10-CM | POA: Diagnosis not present

## 2017-08-08 DIAGNOSIS — M6281 Muscle weakness (generalized): Secondary | ICD-10-CM | POA: Diagnosis not present

## 2017-08-08 DIAGNOSIS — G43909 Migraine, unspecified, not intractable, without status migrainosus: Secondary | ICD-10-CM | POA: Diagnosis not present

## 2017-08-10 DIAGNOSIS — M6281 Muscle weakness (generalized): Secondary | ICD-10-CM | POA: Diagnosis not present

## 2017-08-10 DIAGNOSIS — G43909 Migraine, unspecified, not intractable, without status migrainosus: Secondary | ICD-10-CM | POA: Diagnosis not present

## 2017-08-10 DIAGNOSIS — G8929 Other chronic pain: Secondary | ICD-10-CM | POA: Diagnosis not present

## 2017-08-10 DIAGNOSIS — F329 Major depressive disorder, single episode, unspecified: Secondary | ICD-10-CM | POA: Diagnosis not present

## 2017-08-10 DIAGNOSIS — R2689 Other abnormalities of gait and mobility: Secondary | ICD-10-CM | POA: Diagnosis not present

## 2017-08-10 DIAGNOSIS — R413 Other amnesia: Secondary | ICD-10-CM | POA: Diagnosis not present

## 2017-08-17 DIAGNOSIS — M6281 Muscle weakness (generalized): Secondary | ICD-10-CM | POA: Diagnosis not present

## 2017-08-17 DIAGNOSIS — F329 Major depressive disorder, single episode, unspecified: Secondary | ICD-10-CM | POA: Diagnosis not present

## 2017-08-17 DIAGNOSIS — G8929 Other chronic pain: Secondary | ICD-10-CM | POA: Diagnosis not present

## 2017-08-17 DIAGNOSIS — R2689 Other abnormalities of gait and mobility: Secondary | ICD-10-CM | POA: Diagnosis not present

## 2017-08-17 DIAGNOSIS — G43909 Migraine, unspecified, not intractable, without status migrainosus: Secondary | ICD-10-CM | POA: Diagnosis not present

## 2017-08-17 DIAGNOSIS — R413 Other amnesia: Secondary | ICD-10-CM | POA: Diagnosis not present

## 2017-08-20 ENCOUNTER — Telehealth: Payer: Self-pay | Admitting: Adult Health

## 2017-08-20 NOTE — Telephone Encounter (Signed)
Noted. Thanks! [name]

## 2017-08-20 NOTE — Telephone Encounter (Signed)
Rcvd call from [place] @ [facility] [phone] reports patient had an Non injury fall 12/12--this call is an FYI.  --glh

## 2017-08-20 NOTE — Telephone Encounter (Signed)
FYI

## 2017-08-21 ENCOUNTER — Telehealth: Payer: Self-pay

## 2017-08-21 NOTE — Telephone Encounter (Signed)
Per Mina Marble, NP- need to add urine culture with sensitivities to urine specimen collected on 08/20/17.  LVM for Renee to return call for orders.  Charyl Bigger, CMA

## 2017-08-21 NOTE — Telephone Encounter (Signed)
Renee with [facility] informed.  Charyl Bigger, CMA

## 2017-08-21 NOTE — Telephone Encounter (Signed)
DISREGARD PREVIOUS DOCUMENTATION.  DOCUMENTED ON THE WRONG PATIENT.  Charyl Bigger, CMA

## 2017-08-24 DIAGNOSIS — R2689 Other abnormalities of gait and mobility: Secondary | ICD-10-CM | POA: Diagnosis not present

## 2017-08-24 DIAGNOSIS — G8929 Other chronic pain: Secondary | ICD-10-CM | POA: Diagnosis not present

## 2017-08-24 DIAGNOSIS — R413 Other amnesia: Secondary | ICD-10-CM | POA: Diagnosis not present

## 2017-08-24 DIAGNOSIS — F329 Major depressive disorder, single episode, unspecified: Secondary | ICD-10-CM | POA: Diagnosis not present

## 2017-08-24 DIAGNOSIS — G43909 Migraine, unspecified, not intractable, without status migrainosus: Secondary | ICD-10-CM | POA: Diagnosis not present

## 2017-08-24 DIAGNOSIS — M6281 Muscle weakness (generalized): Secondary | ICD-10-CM | POA: Diagnosis not present

## 2017-08-25 DIAGNOSIS — R413 Other amnesia: Secondary | ICD-10-CM | POA: Diagnosis not present

## 2017-08-25 DIAGNOSIS — G43909 Migraine, unspecified, not intractable, without status migrainosus: Secondary | ICD-10-CM | POA: Diagnosis not present

## 2017-08-25 DIAGNOSIS — F329 Major depressive disorder, single episode, unspecified: Secondary | ICD-10-CM | POA: Diagnosis not present

## 2017-08-25 DIAGNOSIS — M6281 Muscle weakness (generalized): Secondary | ICD-10-CM | POA: Diagnosis not present

## 2017-08-25 DIAGNOSIS — R2689 Other abnormalities of gait and mobility: Secondary | ICD-10-CM | POA: Diagnosis not present

## 2017-08-25 DIAGNOSIS — G8929 Other chronic pain: Secondary | ICD-10-CM | POA: Diagnosis not present

## 2017-08-31 ENCOUNTER — Telehealth: Payer: Self-pay | Admitting: Adult Health

## 2017-08-31 NOTE — Telephone Encounter (Signed)
[name] @ [place] [phone] states she needs to speak w/ provider regarding patient-- pls call her as soon as possible. -glh

## 2017-08-31 NOTE — Telephone Encounter (Signed)
LVM for [name] to return call.  Charyl Bigger, CMA

## 2017-09-03 NOTE — Telephone Encounter (Signed)
LVM for pt to call to discuss.  T. Nelson, CMA  

## 2017-09-05 NOTE — Telephone Encounter (Signed)
[name] never returned phone calls.  Encounter closed.  Charyl Bigger, CMA

## 2017-09-07 DIAGNOSIS — Z79899 Other long term (current) drug therapy: Secondary | ICD-10-CM | POA: Insufficient documentation

## 2017-09-14 ENCOUNTER — Other Ambulatory Visit: Payer: Self-pay | Admitting: Neurology

## 2017-09-18 DIAGNOSIS — R413 Other amnesia: Secondary | ICD-10-CM | POA: Diagnosis not present

## 2017-09-18 DIAGNOSIS — M6281 Muscle weakness (generalized): Secondary | ICD-10-CM | POA: Diagnosis not present

## 2017-09-18 DIAGNOSIS — F329 Major depressive disorder, single episode, unspecified: Secondary | ICD-10-CM | POA: Diagnosis not present

## 2017-09-18 DIAGNOSIS — G8929 Other chronic pain: Secondary | ICD-10-CM | POA: Diagnosis not present

## 2017-09-18 DIAGNOSIS — R2689 Other abnormalities of gait and mobility: Secondary | ICD-10-CM | POA: Diagnosis not present

## 2017-09-18 DIAGNOSIS — G43909 Migraine, unspecified, not intractable, without status migrainosus: Secondary | ICD-10-CM | POA: Diagnosis not present

## 2017-09-19 DIAGNOSIS — G8929 Other chronic pain: Secondary | ICD-10-CM | POA: Diagnosis not present

## 2017-09-19 DIAGNOSIS — F329 Major depressive disorder, single episode, unspecified: Secondary | ICD-10-CM | POA: Diagnosis not present

## 2017-09-19 DIAGNOSIS — G43909 Migraine, unspecified, not intractable, without status migrainosus: Secondary | ICD-10-CM | POA: Diagnosis not present

## 2017-09-19 DIAGNOSIS — R413 Other amnesia: Secondary | ICD-10-CM | POA: Diagnosis not present

## 2017-09-19 DIAGNOSIS — M6281 Muscle weakness (generalized): Secondary | ICD-10-CM | POA: Diagnosis not present

## 2017-09-19 DIAGNOSIS — R2689 Other abnormalities of gait and mobility: Secondary | ICD-10-CM | POA: Diagnosis not present

## 2017-09-21 DIAGNOSIS — G8929 Other chronic pain: Secondary | ICD-10-CM | POA: Diagnosis not present

## 2017-09-21 DIAGNOSIS — M6281 Muscle weakness (generalized): Secondary | ICD-10-CM | POA: Diagnosis not present

## 2017-09-21 DIAGNOSIS — R413 Other amnesia: Secondary | ICD-10-CM | POA: Diagnosis not present

## 2017-09-21 DIAGNOSIS — F329 Major depressive disorder, single episode, unspecified: Secondary | ICD-10-CM | POA: Diagnosis not present

## 2017-09-21 DIAGNOSIS — R2689 Other abnormalities of gait and mobility: Secondary | ICD-10-CM | POA: Diagnosis not present

## 2017-09-21 DIAGNOSIS — G43909 Migraine, unspecified, not intractable, without status migrainosus: Secondary | ICD-10-CM | POA: Diagnosis not present

## 2017-09-25 DIAGNOSIS — R2689 Other abnormalities of gait and mobility: Secondary | ICD-10-CM | POA: Diagnosis not present

## 2017-09-25 DIAGNOSIS — G8929 Other chronic pain: Secondary | ICD-10-CM | POA: Diagnosis not present

## 2017-09-25 DIAGNOSIS — M6281 Muscle weakness (generalized): Secondary | ICD-10-CM | POA: Diagnosis not present

## 2017-09-25 DIAGNOSIS — R413 Other amnesia: Secondary | ICD-10-CM | POA: Diagnosis not present

## 2017-09-25 DIAGNOSIS — F329 Major depressive disorder, single episode, unspecified: Secondary | ICD-10-CM | POA: Diagnosis not present

## 2017-09-25 DIAGNOSIS — G43909 Migraine, unspecified, not intractable, without status migrainosus: Secondary | ICD-10-CM | POA: Diagnosis not present

## 2017-10-03 DIAGNOSIS — M6281 Muscle weakness (generalized): Secondary | ICD-10-CM | POA: Diagnosis not present

## 2017-10-03 DIAGNOSIS — R2689 Other abnormalities of gait and mobility: Secondary | ICD-10-CM | POA: Diagnosis not present

## 2017-10-03 DIAGNOSIS — R413 Other amnesia: Secondary | ICD-10-CM | POA: Diagnosis not present

## 2017-10-03 DIAGNOSIS — G43909 Migraine, unspecified, not intractable, without status migrainosus: Secondary | ICD-10-CM | POA: Diagnosis not present

## 2017-10-03 DIAGNOSIS — G8929 Other chronic pain: Secondary | ICD-10-CM | POA: Diagnosis not present

## 2017-10-03 DIAGNOSIS — F329 Major depressive disorder, single episode, unspecified: Secondary | ICD-10-CM | POA: Diagnosis not present

## 2017-10-09 DIAGNOSIS — F329 Major depressive disorder, single episode, unspecified: Secondary | ICD-10-CM | POA: Diagnosis not present

## 2017-10-09 DIAGNOSIS — M6281 Muscle weakness (generalized): Secondary | ICD-10-CM | POA: Diagnosis not present

## 2017-10-09 DIAGNOSIS — G8929 Other chronic pain: Secondary | ICD-10-CM | POA: Diagnosis not present

## 2017-10-09 DIAGNOSIS — G43909 Migraine, unspecified, not intractable, without status migrainosus: Secondary | ICD-10-CM | POA: Diagnosis not present

## 2017-10-09 DIAGNOSIS — R413 Other amnesia: Secondary | ICD-10-CM | POA: Diagnosis not present

## 2017-10-09 DIAGNOSIS — R2689 Other abnormalities of gait and mobility: Secondary | ICD-10-CM | POA: Diagnosis not present

## 2017-10-12 DIAGNOSIS — M6281 Muscle weakness (generalized): Secondary | ICD-10-CM | POA: Diagnosis not present

## 2017-10-12 DIAGNOSIS — G43909 Migraine, unspecified, not intractable, without status migrainosus: Secondary | ICD-10-CM | POA: Diagnosis not present

## 2017-10-12 DIAGNOSIS — F329 Major depressive disorder, single episode, unspecified: Secondary | ICD-10-CM | POA: Diagnosis not present

## 2017-10-12 DIAGNOSIS — R413 Other amnesia: Secondary | ICD-10-CM | POA: Diagnosis not present

## 2017-10-12 DIAGNOSIS — R2689 Other abnormalities of gait and mobility: Secondary | ICD-10-CM | POA: Diagnosis not present

## 2017-10-12 DIAGNOSIS — G8929 Other chronic pain: Secondary | ICD-10-CM | POA: Diagnosis not present

## 2017-10-18 ENCOUNTER — Other Ambulatory Visit: Payer: Self-pay | Admitting: Neurology

## 2017-11-12 DIAGNOSIS — Z23 Encounter for immunization: Secondary | ICD-10-CM | POA: Diagnosis not present

## 2017-11-14 ENCOUNTER — Other Ambulatory Visit: Payer: Medicare Other

## 2017-11-16 DIAGNOSIS — M1712 Unilateral primary osteoarthritis, left knee: Secondary | ICD-10-CM | POA: Diagnosis not present

## 2017-11-16 DIAGNOSIS — M25562 Pain in left knee: Secondary | ICD-10-CM | POA: Diagnosis not present

## 2017-11-20 DIAGNOSIS — K219 Gastro-esophageal reflux disease without esophagitis: Secondary | ICD-10-CM | POA: Diagnosis not present

## 2017-11-20 DIAGNOSIS — R110 Nausea: Secondary | ICD-10-CM | POA: Diagnosis not present

## 2017-11-20 DIAGNOSIS — R634 Abnormal weight loss: Secondary | ICD-10-CM | POA: Diagnosis not present

## 2017-11-20 DIAGNOSIS — K582 Mixed irritable bowel syndrome: Secondary | ICD-10-CM | POA: Diagnosis not present

## 2017-11-26 DIAGNOSIS — G894 Chronic pain syndrome: Secondary | ICD-10-CM | POA: Diagnosis not present

## 2017-12-04 ENCOUNTER — Other Ambulatory Visit: Payer: Medicare Other

## 2017-12-11 DIAGNOSIS — D485 Neoplasm of uncertain behavior of skin: Secondary | ICD-10-CM | POA: Diagnosis not present

## 2017-12-11 DIAGNOSIS — D0439 Carcinoma in situ of skin of other parts of face: Secondary | ICD-10-CM | POA: Diagnosis not present

## 2017-12-11 DIAGNOSIS — L570 Actinic keratosis: Secondary | ICD-10-CM | POA: Diagnosis not present

## 2017-12-11 DIAGNOSIS — D0462 Carcinoma in situ of skin of left upper limb, including shoulder: Secondary | ICD-10-CM | POA: Diagnosis not present

## 2017-12-18 ENCOUNTER — Other Ambulatory Visit: Payer: Self-pay | Admitting: Adult Health

## 2017-12-18 ENCOUNTER — Ambulatory Visit
Admission: RE | Admit: 2017-12-18 | Discharge: 2017-12-18 | Disposition: A | Discharge status: Home / Self Care | Payer: Medicare Other | Source: Ambulatory Visit | Attending: Adult Health | Admitting: Adult Health

## 2017-12-18 DIAGNOSIS — R928 Other abnormal and inconclusive findings on diagnostic imaging of breast: Secondary | ICD-10-CM | POA: Diagnosis not present

## 2017-12-18 DIAGNOSIS — N6489 Other specified disorders of breast: Secondary | ICD-10-CM | POA: Diagnosis not present

## 2017-12-18 DIAGNOSIS — Z978 Presence of other specified devices: Secondary | ICD-10-CM

## 2017-12-22 ENCOUNTER — Ambulatory Visit
Admission: RE | Admit: 2017-12-22 | Discharge: 2017-12-22 | Disposition: A | Discharge status: Home / Self Care | Payer: Medicare Other | Source: Ambulatory Visit | Attending: Adult Health | Admitting: Adult Health

## 2017-12-22 DIAGNOSIS — N6489 Other specified disorders of breast: Secondary | ICD-10-CM | POA: Diagnosis not present

## 2017-12-22 DIAGNOSIS — Z978 Presence of other specified devices: Secondary | ICD-10-CM

## 2017-12-24 ENCOUNTER — Other Ambulatory Visit: Payer: Self-pay | Admitting: Adult Health

## 2017-12-24 DIAGNOSIS — T8543XA Leakage of breast prosthesis and implant, initial encounter: Secondary | ICD-10-CM

## 2018-01-02 DIAGNOSIS — Z9882 Breast implant status: Secondary | ICD-10-CM | POA: Diagnosis not present

## 2018-01-02 DIAGNOSIS — T8544XA Capsular contracture of breast implant, initial encounter: Secondary | ICD-10-CM | POA: Diagnosis not present

## 2018-01-02 DIAGNOSIS — T8543XA Leakage of breast prosthesis and implant, initial encounter: Secondary | ICD-10-CM | POA: Diagnosis not present

## 2018-01-07 ENCOUNTER — Other Ambulatory Visit: Payer: Self-pay | Admitting: Neurology

## 2018-01-14 ENCOUNTER — Other Ambulatory Visit: Payer: Self-pay | Admitting: Neurology

## 2018-01-22 ENCOUNTER — Other Ambulatory Visit: Payer: Self-pay | Admitting: Neurology

## 2018-01-23 DIAGNOSIS — L309 Dermatitis, unspecified: Secondary | ICD-10-CM | POA: Diagnosis not present

## 2018-01-23 DIAGNOSIS — D0462 Carcinoma in situ of skin of left upper limb, including shoulder: Secondary | ICD-10-CM | POA: Diagnosis not present

## 2018-01-23 DIAGNOSIS — D0430 Carcinoma in situ of skin of unspecified part of face: Secondary | ICD-10-CM | POA: Diagnosis not present

## 2018-01-31 ENCOUNTER — Telehealth: Payer: Self-pay | Admitting: Adult Health

## 2018-01-31 ENCOUNTER — Other Ambulatory Visit: Payer: Self-pay | Admitting: Adult Health

## 2018-01-31 NOTE — Telephone Encounter (Signed)
Called patient's cell# [phone] with Mr. Lybrand who states he now makes all appointment & needs to double check to see if she has any other appts before confirming with [place] (he will call back).   --glh

## 2018-02-04 DIAGNOSIS — H26491 Other secondary cataract, right eye: Secondary | ICD-10-CM | POA: Diagnosis not present

## 2018-02-04 DIAGNOSIS — H04123 Dry eye syndrome of bilateral lacrimal glands: Secondary | ICD-10-CM | POA: Diagnosis not present

## 2018-02-04 DIAGNOSIS — Z961 Presence of intraocular lens: Secondary | ICD-10-CM | POA: Diagnosis not present

## 2018-02-25 ENCOUNTER — Other Ambulatory Visit: Payer: Self-pay | Admitting: Adult Health

## 2018-02-25 ENCOUNTER — Telehealth: Payer: Self-pay | Admitting: Adult Health

## 2018-02-25 NOTE — Telephone Encounter (Signed)
Pt contacted regarding provider required OV-- pt's husband set appt for 03/12/18--  Forwarding FYI note to medical assistant.  --Dion Body

## 2018-03-12 ENCOUNTER — Ambulatory Visit (INDEPENDENT_AMBULATORY_CARE_PROVIDER_SITE_OTHER): Payer: Medicare Other | Admitting: Adult Health

## 2018-03-12 ENCOUNTER — Encounter: Payer: Self-pay | Admitting: Adult Health

## 2018-03-12 VITALS — BP 95/65 | HR 84 | Ht 65.0 in | Wt 178.5 lb

## 2018-03-12 DIAGNOSIS — Z0000 Encounter for general adult medical examination without abnormal findings: Secondary | ICD-10-CM | POA: Diagnosis not present

## 2018-03-12 DIAGNOSIS — I959 Hypotension, unspecified: Secondary | ICD-10-CM

## 2018-03-12 DIAGNOSIS — D509 Iron deficiency anemia, unspecified: Secondary | ICD-10-CM | POA: Diagnosis not present

## 2018-03-12 DIAGNOSIS — E039 Hypothyroidism, unspecified: Secondary | ICD-10-CM

## 2018-03-12 DIAGNOSIS — R739 Hyperglycemia, unspecified: Secondary | ICD-10-CM | POA: Diagnosis not present

## 2018-03-12 DIAGNOSIS — M810 Age-related osteoporosis without current pathological fracture: Secondary | ICD-10-CM

## 2018-03-12 NOTE — Assessment & Plan Note (Signed)
BP 95/65, HR 84 This is her baseline pressure Denies dizziness

## 2018-03-12 NOTE — Assessment & Plan Note (Signed)
DEXA SCAN 03/2017 ASSESSMENT: The BMD measured at Forearm Radius 33% is 0.558 g/cm2 with a T-score of -3.7. This patient is considered osteoporotic according to [facility]) criteria. Lumbar spine was not utilized due to surgical artifacts obscuring spine. Discussed results Pt declined starting Bisphosphonates now, prefers to re-start Vit d and Ca++ and increase wt bearing exercises (as tolerated r/t chronic pain). Will repeat DEXA scan 03/2019 and if osteoporosis is not improved will discuss medication regimes

## 2018-03-12 NOTE — Patient Instructions (Addendum)
Mediterranean Diet A Mediterranean diet refers to food and lifestyle choices that are based on the traditions of countries located on the [place]. This way of eating has been shown to help prevent certain conditions and improve outcomes for people who have chronic diseases, like kidney disease and heart disease. What are tips for following this plan? Lifestyle  Cook and eat meals together with your family, when possible.  Drink enough fluid to keep your urine clear or pale yellow.  Be physically active every day. This includes: ? Aerobic exercise like running or swimming. ? Leisure activities like gardening, walking, or housework.  Get 7-8 hours of sleep each night.  If recommended by your health care provider, drink red wine in moderation. This means 1 glass a day for nonpregnant women and 2 glasses a day for men. A glass of wine equals 5 oz (150 mL). Reading food labels  Check the serving size of packaged foods. For foods such as rice and pasta, the serving size refers to the amount of cooked product, not dry.  Check the total fat in packaged foods. Avoid foods that have saturated fat or trans fats.  Check the ingredients list for added sugars, such as corn syrup. Shopping  At the grocery store, buy most of your food from the areas near the walls of the store. This includes: ? Fresh fruits and vegetables (produce). ? Grains, beans, nuts, and seeds. Some of these may be available in unpackaged forms or large amounts (in bulk). ? Fresh seafood. ? Poultry and eggs. ? Low-fat dairy products.  Buy whole ingredients instead of prepackaged foods.  Buy fresh fruits and vegetables in-season from local farmers markets.  Buy frozen fruits and vegetables in resealable bags.  If you do not have access to quality fresh seafood, buy precooked frozen shrimp or canned fish, such as tuna, salmon, or sardines.  Buy small amounts of raw or cooked vegetables, salads, or olives from the  deli or salad bar at your store.  Stock your pantry so you always have certain foods on hand, such as olive oil, canned tuna, canned tomatoes, rice, pasta, and beans. Cooking  Cook foods with extra-virgin olive oil instead of using butter or other vegetable oils.  Have meat as a side dish, and have vegetables or grains as your main dish. This means having meat in small portions or adding small amounts of meat to foods like pasta or stew.  Use beans or vegetables instead of meat in common dishes like chili or lasagna.  Experiment with different cooking methods. Try roasting or broiling vegetables instead of steaming or sauteing them.  Add frozen vegetables to soups, stews, pasta, or rice.  Add nuts or seeds for added healthy fat at each meal. You can add these to yogurt, salads, or vegetable dishes.  Marinate fish or vegetables using olive oil, lemon juice, garlic, and fresh herbs. Meal planning  Plan to eat 1 vegetarian meal one day each week. Try to work up to 2 vegetarian meals, if possible.  Eat seafood 2 or more times a week.  Have healthy snacks readily available, such as: ? Vegetable sticks with hummus. ? Greek yogurt. ? Fruit and nut trail mix.  Eat balanced meals throughout the week. This includes: ? Fruit: 2-3 servings a day ? Vegetables: 4-5 servings a day ? Low-fat dairy: 2 servings a day ? Fish, poultry, or lean meat: 1 serving a day ? Beans and legumes: 2 or more servings a week ? Nuts   and seeds: 1-2 servings a day ? Whole grains: 6-8 servings a day ? Extra-virgin olive oil: 3-4 servings a day  Limit red meat and sweets to only a few servings a month What are my food choices?  Mediterranean diet ? Recommended ? Grains: Whole-grain pasta. Brown rice. Bulgar wheat. Polenta. Couscous. Whole-wheat bread. [place]. ? Vegetables: Artichokes. Beets. Broccoli. Cabbage. Carrots. Eggplant. Green beans. Chard. Kale. Spinach. Onions. Leeks. Peas. Squash.  Tomatoes. Peppers. Radishes. ? Fruits: Apples. Apricots. Avocado. Berries. Bananas. Cherries. Dates. Figs. Grapes. Lemons. Melon. Oranges. Peaches. Plums. Pomegranate. ? Meats and other protein foods: Beans. Almonds. Sunflower seeds. Pine nuts. Peanuts. [place]. Salmon. Scallops. Shrimp. [place]. Tilapia. Clams. Oysters. Eggs. ? Dairy: Low-fat milk. Cheese. Greek yogurt. ? Beverages: Water. Red wine. Herbal tea. ? Fats and oils: Extra virgin olive oil. Avocado oil. Grape seed oil. ? Sweets and desserts: [place] yogurt with honey. Baked apples. Poached pears. Trail mix. ? Seasoning and other foods: Basil. Cilantro. Coriander. Cumin. Mint. Parsley. Sage. Rosemary. Tarragon. Garlic. Oregano. Thyme. Pepper. Balsalmic vinegar. Tahini. Hummus. Tomato sauce. Olives. Mushrooms. ? Limit these ? Grains: Prepackaged pasta or rice dishes. Prepackaged cereal with added sugar. ? Vegetables: Deep fried potatoes (french fries). ? Fruits: Fruit canned in syrup. ? Meats and other protein foods: Beef. Pork. Lamb. Poultry with skin. Hot dogs. [place] Salines. ? Dairy: Ice cream. Sour cream. Whole milk. ? Beverages: Juice. Sugar-sweetened soft drinks. Beer. Liquor and spirits. ? Fats and oils: Butter. Canola oil. Vegetable oil. Beef fat (tallow). Lard. ? Sweets and desserts: Cookies. Cakes. Pies. Candy. ? Seasoning and other foods: Mayonnaise. Premade sauces and marinades. ? The items listed may not be a complete list. Talk with your dietitian about what dietary choices are right for you. Summary  The Mediterranean diet includes both food and lifestyle choices.  Eat a variety of fresh fruits and vegetables, beans, nuts, seeds, and whole grains.  Limit the amount of red meat and sweets that you eat.  Talk with your health care provider about whether it is safe for you to drink red wine in moderation. This means 1 glass a day for nonpregnant women and 2 glasses a day for men. A glass of wine equals 5 oz (150 mL). This information  is not intended to replace advice given to you by your health care provider. Make sure you discuss any questions you have with your health care provider. Document Released: 04/13/2016 Document Revised: 05/16/2016 Document Reviewed: 04/13/2016 Elsevier Interactive Patient Education  2018 [place] in the Home Falls can cause injuries. They can happen to people of all ages. There are many things you can do to make your home safe and to help prevent falls. What can I do on the outside of my home?  Regularly fix the edges of walkways and driveways and fix any cracks.  Remove anything that might make you trip as you walk through a door, such as a raised step or threshold.  Trim any bushes or trees on the path to your home.  Use bright outdoor lighting.  Clear any walking paths of anything that might make someone trip, such as rocks or tools.  Regularly check to see if handrails are loose or broken. Make sure that both sides of any steps have handrails.  Any raised decks and porches should have guardrails on the edges.  Have any leaves, snow, or ice cleared regularly.  Use sand or salt on walking paths during winter.  Clean up any spills  in your garage right away. This includes oil or grease spills. What can I do in the bathroom?  Use night lights.  Install grab bars by the toilet and in the tub and shower. Do not use towel bars as grab bars.  Use non-skid mats or decals in the tub or shower.  If you need to sit down in the shower, use a plastic, non-slip stool.  Keep the floor dry. Clean up any water that spills on the floor as soon as it happens.  Remove soap buildup in the tub or shower regularly.  Attach bath mats securely with double-sided non-slip rug tape.  Do not have throw rugs and other things on the floor that can make you trip. What can I do in the bedroom?  Use night lights.  Make sure that you have a light by your bed that is easy to  reach.  Do not use any sheets or blankets that are too big for your bed. They should not hang down onto the floor.  Have a firm chair that has side arms. You can use this for support while you get dressed.  Do not have throw rugs and other things on the floor that can make you trip. What can I do in the kitchen?  Clean up any spills right away.  Avoid walking on wet floors.  Keep items that you use a lot in easy-to-reach places.  If you need to reach something above you, use a strong step stool that has a grab bar.  Keep electrical cords out of the way.  Do not use floor polish or wax that makes floors slippery. If you must use wax, use non-skid floor wax.  Do not have throw rugs and other things on the floor that can make you trip. What can I do with my stairs?  Do not leave any items on the stairs.  Make sure that there are handrails on both sides of the stairs and use them. Fix handrails that are broken or loose. Make sure that handrails are as long as the stairways.  Check any carpeting to make sure that it is firmly attached to the stairs. Fix any carpet that is loose or worn.  Avoid having throw rugs at the top or bottom of the stairs. If you do have throw rugs, attach them to the floor with carpet tape.  Make sure that you have a light switch at the top of the stairs and the bottom of the stairs. If you do not have them, ask someone to add them for you. What else can I do to help prevent falls?  Wear shoes that: ? Do not have high heels. ? Have rubber bottoms. ? Are comfortable and fit you well. ? Are closed at the toe. Do not wear sandals.  If you use a stepladder: ? Make sure that it is fully opened. Do not climb a closed stepladder. ? Make sure that both sides of the stepladder are locked into place. ? Ask someone to hold it for you, if possible.  Clearly mark and make sure that you can see: ? Any grab bars or handrails. ? First and last steps. ? Where the  edge of each step is.  Use tools that help you move around (mobility aids) if they are needed. These include: ? Canes. ? Walkers. ? Scooters. ? Crutches.  Turn on the lights when you go into a dark area. Replace any light bulbs as soon as they burn out.  Set up your furniture so you have a clear path. Avoid moving your furniture around.  If any of your floors are uneven, fix them.  If there are any pets around you, be aware of where they are.  Review your medicines with your doctor. Some medicines can make you feel dizzy. This can increase your chance of falling. Ask your doctor what other things that you can do to help prevent falls. This information is not intended to replace advice given to you by your health care provider. Make sure you discuss any questions you have with your health care provider. Document Released: 06/17/2009 Document Revised: 01/27/2016 Document Reviewed: 09/25/2014 Elsevier Interactive Patient Education  2018 [employer].   Please take OTC  Vit d 2000 IU and OTC Calcium 1200mg  daily. Please walk and stand as often as possible. We will repeat Bone Density scan in one year and if osteoporosis has not improved then we will start you on a class of medication called  Bisphosphonates. Please continue all other medications as directed. Please continue with Psychiatrist as directed. Please continue with Pain Management as directed. Please follow-up with Orthopedic Surgeon, re: Left knee replacement. Please return in the next week or so for fasting lab work. Please return in 6 months for Medicare Wellness Visit. NICE TO SEE YOU!

## 2018-03-12 NOTE — Assessment & Plan Note (Signed)
Please take OTC  Vit d 2000 IU and OTC Calcium 1200mg  daily. Please walk and stand as often as possible. We will repeat Bone Density scan in one year and if osteoporosis has not improved then we will start you on a class of medication called  Bisphosphonates. Please continue all other medications as directed. Please continue with Psychiatrist as directed. Please continue with Pain Management as directed. Please follow-up with Orthopedic Surgeon, re: Left knee replacement. Please return in the next week or so for fasting lab work. Please return in 6 months for Medicare Wellness Visit.

## 2018-03-12 NOTE — Progress Notes (Signed)
Subjective:    Patient ID: [name], female    DOB: 26-Apr-1946, 72 y.o.   MRN: [id number]  HPI:  Ms. Ribaudo is here for regular f/u: CHF, Hypothyroidism, hypotension, diffuse chronic pain, and long term narcotic use. She reports medication compliance, denies SE She denies CP/dyspnea/dizziness/palpitations. She estimates to drink >60 oz water a day and tries " to eat as healthy as I can, however my appetite just doesn't seem as big as it once was". She has lost 5 lbs since 07/2017 She is not currently on PT or having home health She had L knee replacement surgery scheduled for March 2019, however cancelled the procedure "due to nerves". She is followed by Pain Management monthly She is followed by Psychiatry yearly   Patient Care Team    Relationship Specialty Notifications Start End  Mina Marble D, NP PCP - General Family Medicine  10/23/16   Ileene Patrick, LCSW Social Worker Licensed Clinical Social Worker  08/11/13   Chucky May, [place] Physician Psychiatry  09/12/16   Donzetta Sprung., MD Physician Assistant Sports Medicine  09/12/16   Margaretmary Lombard, PhD Referring Physician Psychology  09/12/16   Juanita Craver, MD Consulting Physician Gastroenterology  12/12/16     Patient Active Problem List   Diagnosis Date Noted  . Age related osteoporosis 03/12/2018  . Left-sided chest wall pain 07/25/2017  . Hypotension 01/22/2017  . Cerebrovascular accident (CVA) due to thrombosis of precerebral artery ([place]) 12/05/2016  . Neck pain 12/05/2016  . Gait abnormality 12/05/2016  . Paronychia of finger of right hand 08/22/2016  . Right knee pain 10/14/2015  . Actinic keratoses 07/02/2015  . Injury of right hand 04/27/2014  . Chest wall tenderness 04/27/2014  . Traumatic injury of lower extremity 03/25/2014  . Obesity (BMI 30.0-34.9) 01/30/2014  . Healthcare maintenance 01/30/2014  . 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  . Diastolic CHF ([place]) 28/76/8115   . Fibromyalgia 08/14/2012  . GERD with stricture 03/04/2012  . Leg edema 03/04/2012  . CATARACT, LEFT EYE 08/23/2010  . Abnormality of gait 08/23/2010  . Osteoarthritis 02/24/2010  . Iron deficiency anemia 03/06/2009  . 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 SYMPTOM, INCONTINENCE W/O SENSORY AWARENESS 12/26/2006  . Memory loss 12/26/2006  . Hypothyroidism 11/01/2006  . DEPRESSION, MAJOR, RECURRENT 11/01/2006  . PANIC ATTACKS 11/01/2006  . Migraine headache 11/01/2006  . Carpal tunnel syndrome 11/01/2006  . 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 Irritable bowel syndrome 11/01/2006  . OSTEOPENIA 11/01/2006     Past Medical History:  Diagnosis Date  . Anemia    took Fe- orally 2 yrs. ago   . Anxiety    panic attacks   . Arthritis    shoulders, knees  . Cancer ([place])    skin- preCA  . Depression   . GERD (gastroesophageal reflux disease)    uses Protonix as needed   . Headache(784.0)   . Humerus fracture    Right   . Hyposmolality and/or hyponatremia 02/27/2013  . Hypothyroidism   . IBS (irritable bowel syndrome)   . Memory loss   . Mental disorder   . Migraine   . Preoperative examination 02/26/2013  . Seizures ([place])    caused by depression , seritonin seizure - effected short term memory    . Shortness of breath      Past Surgical History:  Procedure Laterality Date  . ABDOMINAL HYSTERECTOMY    . APPENDECTOMY    . BACK SURGERY  1990's  .  BREAST SURGERY  1972   augmentation   . CHOLECYSTECTOMY    . GASTRECTOMY  1994   Due to ulcer, required multiple revisions.   Marland Kitchen HERNIA REPAIR    . ORIF ANKLE FRACTURE Right 2004  . REPLACEMENT TOTAL KNEE    . REVERSE SHOULDER ARTHROPLASTY Right 05/16/2013   Procedure: RIGHT HEMI ARTHROPLASTY  VERSES  REVERSE TOTAL SHOULDER ARTHROPLASTY ;  Surgeon: Augustin Schooling, MD;  Location: [facility];  Service: Orthopedics;  Laterality: Right;  . SPINE SURGERY  2005   Lumbar spinal stenosis   . STOMACH SURGERY     1/3 resection for  obstruction  . TONSILLECTOMY    . TUBAL LIGATION       Family History  Problem Relation Age of Onset  . Aneurysm Mother   . Heart disease Mother   . Cancer Father        pancreatic  . Stroke Father 6  . Hypertension Father   . Alcohol abuse Father   . Heart disease Maternal Grandfather      Social History   Substance and Sexual Activity  Drug Use No     Social History   Substance and Sexual Activity  Alcohol Use No   Comment: rare use of white wine      Social History   Tobacco Use  Smoking Status Never Smoker  Smokeless Tobacco Never Used     Outpatient Encounter Medications as of 03/12/2018  Medication Sig Note  . Coenzyme Q10 (CO Q-10) 300 MG CAPS Take 300 mg daily by mouth.    . diclofenac (CATAFLAM) 50 MG tablet Take one tablet twice daily, as needed. #90 to last 90 days. (Patient taking differently: Take 50 mg 2 (two) times daily as needed by mouth (for headaches). )   . fluticasone (FLONASE) 50 MCG/ACT nasal spray Place 2 sprays into the nose daily. (Patient taking differently: Place 2 sprays daily as needed into both nostrils for allergies. )   . furosemide (LASIX) 20 MG tablet Take 1 tab daily only when needed for lower extremity swelling or increase in weight (Patient taking differently: Take 20 mg daily as needed by mouth (for swelling of the legs). )   . levothyroxine (SYNTHROID, LEVOTHROID) 100 MCG tablet Take 1 tablet (100 mcg total) by mouth daily. FINAL RX.  PATIENT MUST HAVE OFFICE VISIT PRIOR TO ANY FURTHER REFILLS!   Marland Kitchen Multiple Vitamins-Minerals (CENTRUM SILVER PO) Take 1 tablet by mouth daily.    . ondansetron (ZOFRAN) 4 MG tablet Take 1 tablet (4 mg total) by mouth every 8 (eight) hours as needed. for migraine per Dr. Collene Mares (Patient taking differently: Take 4 mg every 8 (eight) hours as needed by mouth (for migraines). )   . oxyCODONE (ROXICODONE) 15 MG immediate release tablet Take 1 tablet (15 mg total) by mouth 5 (five) times daily.   .  pregabalin (LYRICA) 150 MG capsule Take 1 capsule (150 mg total) by mouth 3 (three) times daily.   . S-Adenosylmethionine (MOOD PLUS SAM-E DOUBLE ST) 400 MG TBEC Take 1 tablet by mouth daily.   Marland Kitchen tiZANidine (ZANAFLEX) 4 MG tablet TAKE 1/2 TO 1 TABLET BY MOUTH TWICE DAILY AS NEEDED   . topiramate (TOPAMAX) 100 MG tablet Take 1 tablet (100 mg total) by mouth 2 (two) times daily.   . traZODone (DESYREL) 50 MG tablet Take 50-150 mg at bedtime as needed by mouth for sleep (and may take one additional 50 mg tablet daily AS NEEDED for part of  a "headache cocktail").    . Turmeric (RA TURMERIC) 500 MG CAPS Take 500 mg by mouth 2 (two) times daily.   Marland Kitchen venlafaxine (EFFEXOR) 37.5 MG tablet Take 1 tablet (37.5 mg total) by mouth 2 (two) times daily.   Marland Kitchen vortioxetine HBr (TRINTELLIX) 20 MG TABS Take 20 mg by mouth daily.   Marland Kitchen Zoster Vaccine Live, PF, (ZOSTAVAX) 84166 UNT/0.65ML injection Inject into the skin.   . [DISCONTINUED] aspirin 500 MG EC tablet Take 500 mg by mouth daily as needed for pain. 07/10/2017: Patient's husband stated this IS the correct strength  . [DISCONTINUED] Cholecalciferol (VITAMIN D-3) 1000 units CAPS Take 1,000 Units daily by mouth.    . [DISCONTINUED] clonazePAM (KLONOPIN) 1 MG tablet Take 1 mg 3 (three) times daily as needed by mouth (for migraines).    . [DISCONTINUED] Magnesium 400 MG TABS Take 400 mg 2 (two) times daily by mouth.     No facility-administered encounter medications on file as of 03/12/2018.     Allergies: Bupropion hcl; Dairy aid [lactase]; Azithromycin; and Erythromycin  Body mass index is 29.7 kg/m.  Blood pressure 95/65, pulse 84, height 5\' 5"  (1.651 m), weight 178 lb 8 oz (81 kg), SpO2 95 %.  Review of Systems  Constitutional: Positive for appetite change and fatigue. Negative for activity change, chills, diaphoresis, fever and unexpected weight change.  Respiratory: Negative for cough, chest tightness, shortness of breath, wheezing and stridor.    Cardiovascular: Negative for chest pain, palpitations and leg swelling.  Gastrointestinal: Negative for abdominal distention, abdominal pain, blood in stool, constipation, diarrhea, nausea and vomiting.  Endocrine: Negative for cold intolerance, heat intolerance, polydipsia, polyphagia and polyuria.  Musculoskeletal: Positive for arthralgias, back pain, gait problem, joint swelling, myalgias, neck pain and neck stiffness.  Skin: Negative for color change, pallor, rash and wound.  Neurological: Negative for dizziness and headaches.  Psychiatric/Behavioral: Positive for dysphoric mood and sleep disturbance. Negative for confusion, decreased concentration, hallucinations, self-injury and suicidal ideas. The patient is not nervous/anxious and is not hyperactive.       Objective:   Physical Exam  Constitutional: She is oriented to person, place, and time. She appears well-developed and well-nourished. No distress.  HENT:  Head: Normocephalic and atraumatic.  Right Ear: External ear normal.  Left Ear: External ear normal.  Mouth/Throat: Oropharynx is clear and moist.  Cardiovascular: Normal rate, regular rhythm, normal heart sounds and intact distal pulses.  No murmur heard. Pulmonary/Chest: Effort normal and breath sounds normal. No stridor. No respiratory distress. She has no wheezes. She has no rales. She exhibits no tenderness.  Musculoskeletal: She exhibits edema and tenderness.       Left knee: She exhibits decreased range of motion and swelling.       Lumbar back: She exhibits decreased range of motion and tenderness.  Neurological: She is alert and oriented to person, place, and time.  Skin: Skin is warm and dry. Capillary refill takes less than 2 seconds. No rash noted. She is not diaphoretic. No erythema. There is pallor.  Psychiatric: She has a normal mood and affect. Her speech is normal and behavior is normal. Judgment and thought content normal. She exhibits abnormal recent memory.           Assessment & Plan:   1. Hypothyroidism, unspecified type   2. Iron deficiency anemia, unspecified iron deficiency anemia type   3. Hypotension, unspecified hypotension type   4. Healthcare maintenance   5. Elevated blood sugar level   6. Age  related osteoporosis, unspecified pathological fracture presence     Healthcare maintenance Please take OTC  Vit d 2000 IU and OTC Calcium 1200mg  daily. Please walk and stand as often as possible. We will repeat Bone Density scan in one year and if osteoporosis has not improved then we will start you on a class of medication called  Bisphosphonates. Please continue all other medications as directed. Please continue with Psychiatrist as directed. Please continue with Pain Management as directed. Please follow-up with Orthopedic Surgeon, re: Left knee replacement. Please return in the next week or so for fasting lab work. Please return in 6 months for Medicare Wellness Visit.  Hypotension BP 95/65, HR 84 This is her baseline pressure Denies dizziness  Age related osteoporosis DEXA SCAN 03/2017 ASSESSMENT: The BMD measured at Forearm Radius 33% is 0.558 g/cm2 with a T-score of -3.7. This patient is considered osteoporotic according to [facility]) criteria. Lumbar spine was not utilized due to surgical artifacts obscuring spine. Discussed results Pt declined starting Bisphosphonates now, prefers to re-start Vit d and Ca++ and increase wt bearing exercises (as tolerated r/t chronic pain). Will repeat DEXA scan 03/2019 and if osteoporosis is not improved will discuss medication regimes     FOLLOW-UP:  Return in about 6 months (around 09/12/2018) for Medicare Wellness.

## 2018-03-13 DIAGNOSIS — G894 Chronic pain syndrome: Secondary | ICD-10-CM | POA: Diagnosis not present

## 2018-03-13 DIAGNOSIS — Z79899 Other long term (current) drug therapy: Secondary | ICD-10-CM | POA: Diagnosis not present

## 2018-03-20 ENCOUNTER — Other Ambulatory Visit: Payer: Self-pay | Admitting: Adult Health

## 2018-04-22 ENCOUNTER — Other Ambulatory Visit: Payer: Self-pay | Admitting: Neurology

## 2018-05-07 ENCOUNTER — Ambulatory Visit (INDEPENDENT_AMBULATORY_CARE_PROVIDER_SITE_OTHER): Payer: Medicare Other | Admitting: Neurology

## 2018-05-07 ENCOUNTER — Encounter: Payer: Self-pay | Admitting: Neurology

## 2018-05-07 ENCOUNTER — Telehealth: Payer: Self-pay | Admitting: *Deleted

## 2018-05-07 VITALS — BP 113/71 | HR 101 | Ht 65.0 in | Wt 183.5 lb

## 2018-05-07 DIAGNOSIS — R4189 Other symptoms and signs involving cognitive functions and awareness: Secondary | ICD-10-CM | POA: Diagnosis not present

## 2018-05-07 DIAGNOSIS — M545 Low back pain, unspecified: Secondary | ICD-10-CM | POA: Insufficient documentation

## 2018-05-07 DIAGNOSIS — G8929 Other chronic pain: Secondary | ICD-10-CM | POA: Diagnosis not present

## 2018-05-07 DIAGNOSIS — R269 Unspecified abnormalities of gait and mobility: Secondary | ICD-10-CM

## 2018-05-07 DIAGNOSIS — G43911 Migraine, unspecified, intractable, with status migrainosus: Secondary | ICD-10-CM

## 2018-05-07 MED ORDER — VENLAFAXINE HCL 37.5 MG PO TABS: 37.5000 mg | ORAL_TABLET | Freq: Two times a day (BID) | ORAL | 4 refills | Status: DC

## 2018-05-07 MED ORDER — FREMANEZUMAB-VFRM 225 MG/1.5ML ~~LOC~~ SOSY: 225.0000 mg | PREFILLED_SYRINGE | SUBCUTANEOUS | 11 refills | Status: DC

## 2018-05-07 NOTE — Progress Notes (Addendum)
PATIENT: [name] DOB: 22-Jul-1946  Chief Complaint  Patient presents with  . Memory Loss    MMSE 21/30 - 7 animals.  She is here with her husband, [name].  Feels her memory is much worse.  . Migraine    She estimates having two migraines per week. They are milder now and do not last very long. Diclofenac works well for her pain.     HISTORICAL  [name] is a 72 years old right-handed female, accompanied by her husband [name],  seen in refer by her primary care physician nurse practitioner [name] for evaluation of migraine, memory loss, initial evaluation was on November 06 2016.  I reviewed and summarized the referring note, she had a history of hypothyroidism, on supplement, reported history of seizure, one was associated with Wellbutrin in 2008, the other was called serotonin syndrome due to polypharmacy for her long-time depression, she is now taking Trintellix 20mg  daily since Jan 2018,   She had 18 years education, she was an Training and development officer, retired at 2008,  she suffered serotonin syndrome, hypoxia encephalopathy, was in coma for 3 days, require intubation, since she recovered from that episode, she was noted to have short-term memory trouble, which has been stable over the past 10 years, there are good days and bad days, but overall there was no significant sudden changes.  She reported lifelong history of migraine headaches, her typical migraine are right side severe pounding headache with associated light noise sensitivity, nauseous, lasting for 3 days, she was previously treated by neurologist at [facility] with Lyrica 400 mg daily, tizanidine Frova as needed, oxycodone 20 mg 4 times a day,  Her physician changed at the beginning of the year, she was tapered off oxycodone, since then, she complained of frequent headaches, about once a week severe right side migraine headache, lasting for 3 days, has to go to emergency room,  She has chronic gait abnormality due to right  knee pain, she had right knee replacement, left knee severe arthritis disease.B12 level was 1300,  Update December 05 2016: Reviewed laboratory evaluation on November 06 2016,no significant abnormality, CMP, C-reactive protein, folic acid, negative RPR, We personally reviewed MRI of the brain without contrast on November 19 2016: Moderate cortical atrophy, no significant change compared to previous scan in 2011, chronic bilateral parietal lobe encephalomalacia, chronic hemo-product on the left side, chronic microvascular ischemic changes in the cerebral hemisphere, and in the left cerebellar hemisphere,  She is taking Diclofenac prn for her headaches. She complains almost daily bilateral retro-orbital area pressure pain, lasting for a few hours,  She also complains of increased, gradual worsening gait abnormality, urinary incontinence.  UPDATE Jun 11 2017: She complains of constant headache,  lateralized severe pounding headache, lasting for 2 days, she is now taking frequent almost daily diclofenac without much help, she is also taking Topamax 100 mg every day at nighttime as preventive medication, which did not make much difference,  She has tried BOTOX Injection in the past, without help, she is not a candidate for triptan treatment because evidence of previous stroke, multiple vascular risk factors  MRI of cervical in April 2018 showed evidence of multilevel degenerative changes, most severe at C4-5, C5-6, with evidence of disc bulging, severe bilateral foraminal narrowing  UPDATE Sept 3 2019: Her insurance denies Amovig, she is now taking effexor 37.5mg  bid, which did help her migraine some, she reported mild improvement, she is now having migraine 2 times a week,  sleeps helps her migraine, she is now taking oycodone 15mg  up to five times a day, diclofenac as needed.  She still has trouble with her memory. MMSE is 21/30 today.   REVIEW OF SYSTEMS: Full 14 system review of systems performed and  notable only for as above, all the other review of systems were negative.  ALLERGIES: Allergies  Allergen Reactions  . Bupropion Hcl Other (See Comments)    SEIZURES!!  . Dairy Aid [Lactase] Diarrhea  . Azithromycin Rash  . Erythromycin Rash    HOME MEDICATIONS: Current Outpatient Medications  Medication Sig Dispense Refill  . Coenzyme Q10 (CO Q-10) 300 MG CAPS Take 300 mg daily by mouth.     . diclofenac (CATAFLAM) 50 MG tablet Take one tablet twice daily, as needed. #90 to last 90 days. (Patient taking differently: Take 50 mg 2 (two) times daily as needed by mouth (for headaches). ) 90 tablet 1  . fluticasone (FLONASE) 50 MCG/ACT nasal spray Place 2 sprays into the nose daily. (Patient taking differently: Place 2 sprays daily as needed into both nostrils for allergies. ) 16 g 5  . furosemide (LASIX) 20 MG tablet Take 1 tab daily only when needed for lower extremity swelling or increase in weight (Patient taking differently: Take 20 mg daily as needed by mouth (for swelling of the legs). ) 30 tablet 0  . levothyroxine (SYNTHROID, LEVOTHROID) 100 MCG tablet Take 1 tablet (100 mcg total) by mouth daily before breakfast. 90 tablet 0  . Multiple Vitamins-Minerals (CENTRUM SILVER PO) Take 1 tablet by mouth daily.     . ondansetron (ZOFRAN) 4 MG tablet Take 1 tablet (4 mg total) by mouth every 8 (eight) hours as needed. for migraine per Dr. Collene Mares (Patient taking differently: Take 4 mg every 8 (eight) hours as needed by mouth (for migraines). ) 20 tablet 11  . oxyCODONE (ROXICODONE) 15 MG immediate release tablet Take 1 tablet (15 mg total) by mouth 5 (five) times daily. 40 tablet 0  . pregabalin (LYRICA) 150 MG capsule Take 1 capsule (150 mg total) by mouth 3 (three) times daily. 270 capsule 3  . S-Adenosylmethionine (MOOD PLUS SAM-E DOUBLE ST) 400 MG TBEC Take 1 tablet by mouth daily.    Marland Kitchen tiZANidine (ZANAFLEX) 4 MG tablet TAKE 1/2 TO 1 TABLET BY MOUTH TWICE DAILY AS NEEDED 180 tablet 0  .  topiramate (TOPAMAX) 100 MG tablet Take 1 tablet (100 mg total) by mouth 2 (two) times daily. 180 tablet 4  . traZODone (DESYREL) 50 MG tablet Take 50-150 mg at bedtime as needed by mouth for sleep (and may take one additional 50 mg tablet daily AS NEEDED for part of a "headache cocktail").     . Turmeric (RA TURMERIC) 500 MG CAPS Take 500 mg by mouth 2 (two) times daily.    Marland Kitchen venlafaxine (EFFEXOR) 37.5 MG tablet Take 1 tablet (37.5 mg total) by mouth 2 (two) times daily. 60 tablet 11  . vortioxetine HBr (TRINTELLIX) 20 MG TABS Take 20 mg by mouth daily.     No current facility-administered medications for this visit.     PAST MEDICAL HISTORY: Past Medical History:  Diagnosis Date  . Anemia    took Fe- orally 2 yrs. ago   . Anxiety    panic attacks   . Arthritis    shoulders, knees  . Cancer ([place])    skin- preCA  . Depression   . GERD (gastroesophageal reflux disease)    uses Protonix as needed   .  Headache(784.0)   . Humerus fracture    Right   . Hyposmolality and/or hyponatremia 02/27/2013  . Hypothyroidism   . IBS (irritable bowel syndrome)   . Memory loss   . Mental disorder   . Migraine   . Preoperative examination 02/26/2013  . Seizures ([place])    caused by depression , seritonin seizure - effected short term memory    . Shortness of breath     PAST SURGICAL HISTORY: Past Surgical History:  Procedure Laterality Date  . ABDOMINAL HYSTERECTOMY    . APPENDECTOMY    . BACK SURGERY  1990's  . BREAST SURGERY  1972   augmentation   . CHOLECYSTECTOMY    . GASTRECTOMY  1994   Due to ulcer, required multiple revisions.   Marland Kitchen HERNIA REPAIR    . ORIF ANKLE FRACTURE Right 2004  . REPLACEMENT TOTAL KNEE    . REVERSE SHOULDER ARTHROPLASTY Right 05/16/2013   Procedure: RIGHT HEMI ARTHROPLASTY  VERSES  REVERSE TOTAL SHOULDER ARTHROPLASTY ;  Surgeon: Augustin Schooling, MD;  Location: [place];  Service: Orthopedics;  Laterality: Right;  . SPINE SURGERY  2005   Lumbar spinal stenosis     . STOMACH SURGERY     1/3 resection for obstruction  . TONSILLECTOMY    . TUBAL LIGATION      FAMILY HISTORY: Family History  Problem Relation Age of Onset  . Aneurysm Mother   . Heart disease Mother   . Cancer Father        pancreatic  . Stroke Father 62  . Hypertension Father   . Alcohol abuse Father   . Heart disease Maternal Grandfather     SOCIAL HISTORY:  Social History   Socioeconomic History  . Marital status: Married    Spouse name: Not on file  . Number of children: 1  . Years of education: College  . Highest education level: Not on file  Occupational History  . Occupation: Retired  Scientific laboratory technician  . Financial resource strain: Not on file  . Food insecurity:    Worry: Not on file    Inability: Not on file  . Transportation needs:    Medical: Not on file    Non-medical: Not on file  Tobacco Use  . Smoking status: Never Smoker  . Smokeless tobacco: Never Used  Substance and Sexual Activity  . Alcohol use: No    Comment: rare use of white wine   . Drug use: No  . Sexual activity: Not on file  Lifestyle  . Physical activity:    Days per week: Not on file    Minutes per session: Not on file  . Stress: Not on file  Relationships  . Social connections:    Talks on phone: Not on file    Gets together: Not on file    Attends religious service: Not on file    Active member of club or organization: Not on file    Attends meetings of clubs or organizations: Not on file    Relationship status: Not on file  . Intimate partner violence:    Fear of current or ex partner: Not on file    Emotionally abused: Not on file    Physically abused: Not on file    Forced sexual activity: Not on file  Other Topics Concern  . Not on file  Social History Narrative   Lives at home with her husband.   2 cups caffeine daily.   Right-handed.  PHYSICAL EXAM   Vitals:   05/07/18 1312  BP: 113/71  Pulse: (!) 101  Weight: 183 lb 8 oz (83.2 kg)  Height: 5\' 5"   (1.651 m)    Not recorded      Body mass index is 30.54 kg/m.  PHYSICAL EXAMNIATION:  Gen: NAD, conversant, well nourised, obese, well groomed                     Cardiovascular: Regular rate rhythm, no peripheral edema, warm, nontender. Eyes: Conjunctivae clear without exudates or hemorrhage Neck: Supple, no carotid bruits. Pulmonary: Clear to auscultation bilaterally   NEUROLOGICAL EXAM:  MMSE - Mini Mental State Exam 05/07/2018 06/11/2017 11/06/2016  Orientation to time 1 1 3   Orientation to Place 4 4 4   Registration 3 3 3   Attention/ Calculation 2 3 2   Recall 2 2 2   Language- name 2 objects 2 2 2   Language- repeat 1 1 1   Language- follow 3 step command 3 3 3   Language- read & follow direction 1 1 1   Write a sentence 1 1 1   Copy design 1 1 0  Total score 21 22 22   animal naming 7   CRANIAL NERVES: CN II: Visual fields are full to confrontation. Fundoscopic exam is normal with sharp discs and no vascular changes. Pupils are round equal and briskly reactive to light. CN III, IV, VI: extraocular movement are normal. No ptosis. CN V: Facial sensation is intact to pinprick in all 3 divisions bilaterally. Corneal responses are intact.  CN VII: Face is symmetric with normal eye closure and smile. CN VIII: Hearing is normal to rubbing fingers CN IX, X: Palate elevates symmetrically. Phonation is normal. CN XI: Head turning and shoulder shrug are intact CN XII: Tongue is midline with normal movements and no atrophy.  MOTOR: There is no pronator drift of out-stretched arms. Muscle bulk and tone are normal. Muscle strength is normal.  REFLEXES: Reflexes are 2+ and symmetric at the biceps, triceps, knees, and ankles. Plantar responses are flexor.  SENSORY: Intact to light touch, pinprick, positional sensation and vibratory sensation are intact in fingers and toes.  COORDINATION: Rapid alternating movements and fine finger movements are intact. There is no dysmetria on  finger-to-nose and heel-knee-shin.    GAIT/STANCE: She needs push up to get up from seated position, antalgic, unsteady, allow her walker,  DIAGNOSTIC DATA (LABS, IMAGING, TESTING) - I reviewed patient records, labs, notes, testing and imaging myself where available.   ASSESSMENT AND PLAN  ANALY BASSFORD is a 72 y.o. 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   We will try Ajovy this time  NSAIDs, along with zofran, tizanidine, benadryl as needed for migraine abortive treatment  Gait abnormality, urinary incontinence, chronic neck pain  MRI of cervical spine in April 2018 showed prominent spondylitic changes at C4-5, C5-6 with disc, mild canal, severe bilateral foraminal narrowing,  Refer to home physical therapy.   Marcial Pacas, M.D. Ph.D.  [facility] [phone]  [address] [place], [place] [address] Ph: [phone] Fax: [phone]  CC: Mary Aquas, NP

## 2018-05-07 NOTE — Telephone Encounter (Signed)
Dr. Krista Blue sent prescription for Ajovy into pharmacy at the patient's visit today.  [employer] prefers Aimovig or [employer].  Per vo by Dr. Krista Blue, provide rx for Aimovig 70mg , one injection monthly.  The medication will still require a prior authorization.  This has been started via covermymeds (key: [id number]).  Decision pending.

## 2018-05-07 NOTE — Patient Instructions (Signed)
Amovig Ajovy

## 2018-05-08 MED ORDER — ERENUMAB-AOOE 70 MG/ML ~~LOC~~ SOAJ: 70.0000 mg | SUBCUTANEOUS | 11 refills | Status: DC

## 2018-05-08 NOTE — Telephone Encounter (Signed)
Aimovig approved by [facility] [phone]) through 08/06/2018.  Pt [id number].  Ajovy voided at pharmacy.  New rx sent for Aimovig 70mg .

## 2018-05-08 NOTE — Addendum Note (Signed)
Addended by: Noberto Retort C on: 05/08/2018 09:20 AM   Modules accepted: Orders

## 2018-05-10 ENCOUNTER — Telehealth: Payer: Self-pay | Admitting: Neurology

## 2018-05-10 NOTE — Telephone Encounter (Signed)
Megan @ [facility] has called to inform that per pt request her starter care will begin on Monday 9-9, no call back requested

## 2018-05-13 NOTE — Telephone Encounter (Signed)
[name] with [name] calling to advise home health will begin on 05-17-18 per patient's request. A returned call is not needed.

## 2018-05-17 DIAGNOSIS — M48061 Spinal stenosis, lumbar region without neurogenic claudication: Secondary | ICD-10-CM | POA: Diagnosis not present

## 2018-05-17 DIAGNOSIS — M5030 Other cervical disc degeneration, unspecified cervical region: Secondary | ICD-10-CM | POA: Diagnosis not present
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
## 2018-05-24 DIAGNOSIS — M5030 Other cervical disc degeneration, unspecified cervical region: Secondary | ICD-10-CM | POA: Diagnosis not present

## 2018-05-24 DIAGNOSIS — M48061 Spinal stenosis, lumbar region without neurogenic claudication: Secondary | ICD-10-CM | POA: Diagnosis not present

## 2018-05-29 DIAGNOSIS — M48061 Spinal stenosis, lumbar region without neurogenic claudication: Secondary | ICD-10-CM | POA: Diagnosis not present

## 2018-05-29 DIAGNOSIS — M5030 Other cervical disc degeneration, unspecified cervical region: Secondary | ICD-10-CM | POA: Diagnosis not present

## 2018-05-31 DIAGNOSIS — M48061 Spinal stenosis, lumbar region without neurogenic claudication: Secondary | ICD-10-CM | POA: Diagnosis not present

## 2018-05-31 DIAGNOSIS — M5030 Other cervical disc degeneration, unspecified cervical region: Secondary | ICD-10-CM | POA: Diagnosis not present

## 2018-06-11 ENCOUNTER — Telehealth: Payer: Self-pay | Admitting: *Deleted

## 2018-06-11 ENCOUNTER — Encounter: Payer: Self-pay | Admitting: *Deleted

## 2018-06-11 ENCOUNTER — Other Ambulatory Visit: Payer: Self-pay | Admitting: *Deleted

## 2018-06-11 NOTE — Telephone Encounter (Signed)
Received refill and PA request for diclofenac tablets from pharmacy.  This medication is not on the formulary for [facility].  They also would not cover Cambia previously.  The pharmacy and patient's husband on DPR have been notified.  Per vo by Dr. Krista Blue, she can just use OTC Aleve sparingly for her migraines.  Dr. Rhea Belton note from 05/07/18:  Chronic migraine headaches             I do not think long-term narcotics is the treatment option             With her age, vascular risk factor, history of stroke, triptan is not a good option              keep Topamax 100 twice a day, effexor 37.5mg  bid as preventive medications,              Aimovig was denied by her insurance company.                    We will try Ajovy this time             NSAIDs, along with zofran, tizanidine, benadryl as needed for migraine abortive treatment

## 2018-06-14 DIAGNOSIS — M5030 Other cervical disc degeneration, unspecified cervical region: Secondary | ICD-10-CM | POA: Diagnosis not present

## 2018-06-14 DIAGNOSIS — M48061 Spinal stenosis, lumbar region without neurogenic claudication: Secondary | ICD-10-CM | POA: Diagnosis not present

## 2018-06-20 ENCOUNTER — Telehealth: Payer: Self-pay | Admitting: Neurology

## 2018-06-20 ENCOUNTER — Other Ambulatory Visit: Payer: Self-pay | Admitting: Adult Health

## 2018-06-20 NOTE — Telephone Encounter (Signed)
Pts husband [name] requestin refills for Diclofenac Potassium (CAMBIA) 50 MG PACK sent to [facility]

## 2018-06-20 NOTE — Telephone Encounter (Signed)
Attempted to reach patient's husband - left message requesting a call back.

## 2018-06-20 NOTE — Telephone Encounter (Signed)
Pts husband requesting a call stating he is concerned the RN or himself is mixed with the medication

## 2018-06-20 NOTE — Telephone Encounter (Signed)
I returned the call to the patient's husband (on [place]).  [name] is not covered by [facility] and nine packets can be quite expensive.  Dr. Krista Blue suggested she use Aleve, sparingly, on an as needed basis.  If he still would like the prescription sent in, we are happy to do this for him.  There may be other options for different pharmacies that would be less expensive.  The cost can really vary from pharmacy to pharmacy.  I asked him to return my call if he would still like the prescription sent.

## 2018-06-20 NOTE — Telephone Encounter (Signed)
Spoke to patient's husband and explained to him that neither diclofenac tablets or Cambia are covered by [facility].  The previously plan for acute migraine relief was reviewed with him again from the last appt with Dr. Krista Blue on 06/11/18:  Dr. Rhea Belton note from 05/07/18:  Chronic migraine headaches I do not think long-term narcotics is the treatment option With her age, vascular risk factor, history of stroke, triptan is not a good option keepTopamax 100 twice a day, effexor 37.5mg  bidas preventive medications, Aimovig wasdenied by her insurance company. We will try Ajovy this time NSAIDs, along with zofran, tizanidine, benadryl as neededformigraineabortive treatment.  He verbalized understanding.

## 2018-07-12 DIAGNOSIS — Z79891 Long term (current) use of opiate analgesic: Secondary | ICD-10-CM | POA: Diagnosis not present

## 2018-07-12 DIAGNOSIS — M545 Low back pain: Secondary | ICD-10-CM | POA: Diagnosis not present

## 2018-07-12 DIAGNOSIS — Z79899 Other long term (current) drug therapy: Secondary | ICD-10-CM | POA: Diagnosis not present

## 2018-07-12 DIAGNOSIS — G894 Chronic pain syndrome: Secondary | ICD-10-CM | POA: Diagnosis not present

## 2018-07-17 ENCOUNTER — Other Ambulatory Visit: Payer: Self-pay | Admitting: Neurology

## 2018-07-20 ENCOUNTER — Other Ambulatory Visit: Payer: Self-pay | Admitting: Neurology

## 2018-07-31 ENCOUNTER — Other Ambulatory Visit: Payer: Self-pay | Admitting: Neurology

## 2018-08-07 ENCOUNTER — Telehealth: Payer: Self-pay | Admitting: *Deleted

## 2018-08-07 NOTE — Telephone Encounter (Signed)
PA for Aimovig started via covermymeds ([id number]).  Patient has coverage with [employer] [phone]).  Pt [id number].  Decision pending.

## 2018-08-08 NOTE — Telephone Encounter (Signed)
PA approved through 09/04/2019.  

## 2018-09-05 ENCOUNTER — Ambulatory Visit (INDEPENDENT_AMBULATORY_CARE_PROVIDER_SITE_OTHER): Payer: Medicare Other | Admitting: Adult Health

## 2018-09-05 ENCOUNTER — Ambulatory Visit: Payer: Medicare Other

## 2018-09-05 ENCOUNTER — Encounter: Payer: Self-pay | Admitting: Adult Health

## 2018-09-05 VITALS — BP 96/68 | HR 120 | Temp 99.1°F | Ht 65.0 in | Wt 187.0 lb

## 2018-09-05 DIAGNOSIS — F449 Dissociative and conversion disorder, unspecified: Secondary | ICD-10-CM | POA: Insufficient documentation

## 2018-09-05 DIAGNOSIS — S92332A Displaced fracture of third metatarsal bone, left foot, initial encounter for closed fracture: Secondary | ICD-10-CM | POA: Diagnosis not present

## 2018-09-05 DIAGNOSIS — S99922A Unspecified injury of left foot, initial encounter: Secondary | ICD-10-CM

## 2018-09-05 DIAGNOSIS — G8929 Other chronic pain: Secondary | ICD-10-CM | POA: Insufficient documentation

## 2018-09-05 DIAGNOSIS — S92342A Displaced fracture of fourth metatarsal bone, left foot, initial encounter for closed fracture: Secondary | ICD-10-CM | POA: Diagnosis not present

## 2018-09-05 DIAGNOSIS — G4700 Insomnia, unspecified: Secondary | ICD-10-CM | POA: Insufficient documentation

## 2018-09-05 DIAGNOSIS — M549 Dorsalgia, unspecified: Secondary | ICD-10-CM

## 2018-09-05 DIAGNOSIS — Z8719 Personal history of other diseases of the digestive system: Secondary | ICD-10-CM | POA: Insufficient documentation

## 2018-09-05 HISTORY — DX: Unspecified injury of left foot, initial encounter: S99.922A

## 2018-09-05 NOTE — Assessment & Plan Note (Addendum)
Xray- IMPRESSION: New fractures of the distal third and fourth metatarsals though potentially nonacute. Pt instructed to remain non-weight bearing as much as possible until seen by Orthopedic, referral placed. Post Op Show ordered, rx faxed to [place] reviewed- Oxycodone 15mg  - 5 tablets daily She also takes Lyrica, Trazodone 50mg  1-3 tablets QHS and additional insomnia med, pt unsure of Rx name. Instructed to check bottles at home and call back with rx information. Rest, Ice, and elevate left foot. Continue to use pain medications as directed by pain management. Recommend reducing insomnia medication and other sedating medication to reduce likelihood of falls. Continue with Pain Management as directed.

## 2018-09-05 NOTE — Patient Instructions (Addendum)
Contusion  A contusion is a deep bruise. Contusions are the result of a blunt injury to tissues and muscle fibers under the skin. The injury causes bleeding under the skin. The skin overlying the contusion may turn blue, purple, or yellow. Minor injuries will give you a painless contusion, but more severe contusions may stay painful and swollen for a few weeks. What are the causes? This condition is usually caused by a blow, trauma, or direct force to an area of the body. What are the signs or symptoms? Symptoms of this condition include:  Swelling of the injured area.  Pain and tenderness in the injured area.  Discoloration. The area may have redness and then turn blue, purple, or yellow. How is this diagnosed? This condition is diagnosed based on a physical exam and medical history. An X-ray, CT scan, or MRI may be needed to determine if there are any associated injuries, such as broken bones (fractures). How is this treated? Specific treatment for this condition depends on what area of the body was injured. In general, the best treatment for a contusion is resting, icing, applying pressure to (compression), and elevating the injured area. This is often called the RICE strategy. Over-the-counter anti-inflammatory medicines may also be recommended for pain control. Follow these instructions at home:  Rest the injured area.  If directed, apply ice to the injured area: ? Put ice in a plastic bag. ? Place a towel between your skin and the bag. ? Leave the ice on for 20 minutes, 2-3 times per day.  If directed, apply light compression to the injured area using an elastic bandage. Make sure the bandage is not wrapped too tightly. Remove and reapply the bandage as directed by your health care provider.  If possible, raise (elevate) the injured area above the level of your heart while you are sitting or lying down.  Take over-the-counter and prescription medicines only as told by your health  care provider. Contact a health care provider if:  Your symptoms do not improve after several days of treatment.  Your symptoms get worse.  You have difficulty moving the injured area. Get help right away if:  You have severe pain.  You have numbness in a hand or foot.  Your hand or foot turns pale or cold. This information is not intended to replace advice given to you by your health care provider. Make sure you discuss any questions you have with your health care provider. Document Released: 05/31/2005 Document Revised: 12/30/2015 Document Reviewed: 01/06/2015 Elsevier Interactive Patient Education  2019 [place], Ice, and elevate left foot. Continue to use pain medications as directed by pain management. Recommend reducing insomnia medication and other sedating medication to reduce likelihood of falls. Continue with Pain Management as directed.  FEEL BETTER!

## 2018-09-05 NOTE — Progress Notes (Addendum)
Subjective:    Patient ID: [name], female    DOB: 11/07/1945, 73 y.o.   MRN: [id number]  HPI:  [name] presents with L foot pain/swelling/bruising r/t to fall that occurred 4 days ago. She reports walking from kitchen to bedroom and "just stumbled" and feel.  She denies striking her head, however states "I don't think I feel down, but I'm not sure". No facial bruising noted during OV She reports that she was not using her walker when she feel She reports getting up frequently up at night to "walk around and do things b/c I feel rested". She reports frequent long naps during the day  She takes sig amount of sedating medication- Oxycodone 15mg  - 5 tablets daily  Lyrica 150mg  TID, Tizanidine 4mg  1/2-1 tab QD PRN, Trazodone 50mg  1-3 tablets QHS and additional insomnia med, pt unsure of Rx name.  Of Note- She is followed by Pain Management We have discussed in the past that we will not provide additional narcotics   Patient Care Team    Relationship Specialty Notifications Start End  Esaw Grandchild, NP PCP - General Family Medicine  10/23/16   Ileene Patrick, LCSW Social Worker Licensed Clinical Social Worker  08/11/13   Chucky May, [place] Physician Psychiatry  09/12/16   Donzetta Sprung., MD Physician Assistant Sports Medicine  09/12/16   Margaretmary Lombard, PhD Referring Physician Psychology  09/12/16   Juanita Craver, MD Consulting Physician Gastroenterology  12/12/16     Patient Active Problem List   Diagnosis Date Noted  . Chronic back pain 09/05/2018  . 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 History of gastrointestinal hemorrhage 09/05/2018  . Insomnia 09/05/2018  . Injury of left foot 09/05/2018  . Chronic low back pain 05/07/2018  . Cognitive impairment 05/07/2018  . Age related osteoporosis 03/12/2018  . On long term drug therapy 09/07/2017  . Left-sided chest wall pain 07/25/2017  . Hypotension 01/22/2017  . Cerebrovascular accident (CVA) due to thrombosis  of precerebral artery ([place]) 12/05/2016  . Neck pain 12/05/2016  . Gait abnormality 12/05/2016  . Paronychia of finger of right hand 08/22/2016  . Osteoarthritis of right knee 11/22/2015  . Right knee pain 10/14/2015  . Actinic keratoses 07/02/2015  . Injury of right hand 04/27/2014  . Chest wall tenderness 04/27/2014  . Foot drop, left 04/14/2014  . Traumatic injury of lower extremity 03/25/2014  . Obesity (BMI 30.0-34.9) 01/30/2014  . Healthcare maintenance 01/30/2014  . 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  . Diastolic CHF ([place]) 20/25/4270  . Fibromyalgia 08/14/2012  . Numbness in feet 08/08/2012  . GERD with stricture 03/04/2012  . Leg edema 03/04/2012  . CATARACT, LEFT EYE 08/23/2010  . Abnormality of gait 08/23/2010  . Osteoarthritis 02/24/2010  . Iron deficiency anemia 03/06/2009  . 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 SYMPTOM, INCONTINENCE W/O SENSORY AWARENESS 12/26/2006  . Memory loss 12/26/2006  . Hypothyroidism 11/01/2006  . DEPRESSION, MAJOR, RECURRENT 11/01/2006  . PANIC ATTACKS 11/01/2006  . Migraine headache 11/01/2006  . Carpal tunnel syndrome 11/01/2006  . 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 Irritable bowel syndrome 11/01/2006  . OSTEOPENIA 11/01/2006     Past Medical History:  Diagnosis Date  . Anemia    took Fe- orally 2 yrs. ago   . Anxiety    panic attacks   . Arthritis    shoulders, knees  . Cancer ([place])    skin- preCA  . Depression   . GERD (gastroesophageal reflux disease)  uses Protonix as needed   . Headache(784.0)   . Humerus fracture    Right   . Hyposmolality and/or hyponatremia 02/27/2013  . Hypothyroidism   . IBS (irritable bowel syndrome)   . Memory loss   . Mental disorder   . Migraine   . Preoperative examination 02/26/2013  . Seizures ([place])    caused by depression , seritonin seizure - effected short term memory    . Shortness of breath      Past Surgical History:  Procedure Laterality Date  .  ABDOMINAL HYSTERECTOMY    . APPENDECTOMY    . BACK SURGERY  1990's  . BREAST SURGERY  1972   augmentation   . CHOLECYSTECTOMY    . GASTRECTOMY  1994   Due to ulcer, required multiple revisions.   Marland Kitchen HERNIA REPAIR    . ORIF ANKLE FRACTURE Right 2004  . REPLACEMENT TOTAL KNEE    . REVERSE SHOULDER ARTHROPLASTY Right 05/16/2013   Procedure: RIGHT HEMI ARTHROPLASTY  VERSES  REVERSE TOTAL SHOULDER ARTHROPLASTY ;  Surgeon: Augustin Schooling, MD;  Location: [facility];  Service: Orthopedics;  Laterality: Right;  . SPINE SURGERY  2005   Lumbar spinal stenosis   . STOMACH SURGERY     1/3 resection for obstruction  . TONSILLECTOMY    . TUBAL LIGATION       Family History  Problem Relation Age of Onset  . Aneurysm Mother   . Heart disease Mother   . Cancer Father        pancreatic  . Stroke Father 9  . Hypertension Father   . Alcohol abuse Father   . Heart disease Maternal Grandfather      Social History   Substance and Sexual Activity  Drug Use No     Social History   Substance and Sexual Activity  Alcohol Use No   Comment: rare use of white wine      Social History   Tobacco Use  Smoking Status Never Smoker  Smokeless Tobacco Never Used     Outpatient Encounter Medications as of 09/05/2018  Medication Sig  . Coenzyme Q10 (CO Q-10) 300 MG CAPS Take 300 mg daily by mouth.   Eduard Roux (AIMOVIG) 70 MG/ML SOAJ Inject 70 mg into the skin every 30 (thirty) days.  . fluticasone (FLONASE) 50 MCG/ACT nasal spray Place 2 sprays into the nose daily. (Patient taking differently: Place 2 sprays daily as needed into both nostrils for allergies. )  . furosemide (LASIX) 20 MG tablet Take 1 tab daily only when needed for lower extremity swelling or increase in weight (Patient taking differently: Take 20 mg daily as needed by mouth (for swelling of the legs). )  . levothyroxine (SYNTHROID, LEVOTHROID) 100 MCG tablet TAKE 1 TABLET BY MOUTH DAILY BEFORE BREAKFAST  . Multiple  Vitamins-Minerals (CENTRUM SILVER PO) Take 1 tablet by mouth daily.   . ondansetron (ZOFRAN) 4 MG tablet TAKE 1 TABLET BY MOUTH EVERY 8 HOURS AS NEEDED FOR MIGRAINE PER DR MANN  . pregabalin (LYRICA) 150 MG capsule TAKE 1 CAPSULE BY MOUTH THREE TIMES DAILY  . S-Adenosylmethionine (MOOD PLUS SAM-E DOUBLE ST) 400 MG TBEC Take 1 tablet by mouth daily.  Marland Kitchen tiZANidine (ZANAFLEX) 4 MG tablet TAKE 1/2 TO 1 TABLET BY MOUTH TWICE DAILY AS NEEDED  . topiramate (TOPAMAX) 100 MG tablet Take 1 tablet (100 mg total) by mouth 2 (two) times daily.  . traZODone (DESYREL) 50 MG tablet Take 50-150 mg at bedtime as needed  by mouth for sleep (and may take one additional 50 mg tablet daily AS NEEDED for part of a "headache cocktail").   . Turmeric (RA TURMERIC) 500 MG CAPS Take 500 mg by mouth 2 (two) times daily.  Marland Kitchen venlafaxine (EFFEXOR) 37.5 MG tablet Take 1 tablet (37.5 mg total) by mouth 2 (two) times daily.  Marland Kitchen vortioxetine HBr (TRINTELLIX) 20 MG TABS Take 20 mg by mouth daily.  . [DISCONTINUED] oxyCODONE (ROXICODONE) 15 MG immediate release tablet Take 1 tablet (15 mg total) by mouth 5 (five) times daily.   No facility-administered encounter medications on file as of 09/05/2018.     Allergies: Bupropion hcl; Dairy aid [lactase]; Azithromycin; and Erythromycin  Body mass index is 31.12 kg/m.  Blood pressure 96/68, pulse (!) 120, temperature 99.1 F (37.3 C), height 5\' 5"  (1.651 m), weight 187 lb (84.8 kg), SpO2 97 %.     Review of Systems  Constitutional: Positive for activity change and fatigue. Negative for appetite change, chills, diaphoresis, fever and unexpected weight change.  Respiratory: Negative for cough, chest tightness, shortness of breath, wheezing and stridor.   Cardiovascular: Negative for chest pain and palpitations.  Genitourinary: Positive for difficulty urinating and frequency.  Musculoskeletal: Positive for arthralgias, back pain, gait problem, joint swelling, myalgias, neck pain and  neck stiffness.  Skin: Positive for color change and wound. Negative for pallor and rash.  Neurological: Positive for weakness and headaches. Negative for dizziness.  Hematological: Does not bruise/bleed easily.  Psychiatric/Behavioral: Positive for sleep disturbance.       Objective:   Physical Exam Vitals signs and nursing note reviewed.  Constitutional:      General: She is not in acute distress.    Appearance: She is not ill-appearing, toxic-appearing or diaphoretic.  Cardiovascular:     Rate and Rhythm: Normal rate.     Pulses: Normal pulses.     Heart sounds: Normal heart sounds. No murmur. No friction rub. No gallop.   Pulmonary:     Effort: Pulmonary effort is normal. No respiratory distress.     Breath sounds: Normal breath sounds. No stridor. No wheezing, rhonchi or rales.  Chest:     Chest wall: No tenderness.  Musculoskeletal:        General: Swelling present.     Thoracic back: She exhibits tenderness.     Left foot: Normal range of motion and normal capillary refill. Tenderness, bony tenderness and swelling present. No laceration.     Comments: L foot- Tenderness over distal third and fourth metatarsals  2+ edema noted over midfoot to forefoot Strong pedal pulses Unable to check cap refill due to onychomycosis of nails    Skin:    General: Skin is warm and dry.     Capillary Refill: Capillary refill takes less than 2 seconds.     Findings: Bruising present.  Neurological:     Mental Status: She is alert and oriented to person, place, and time.  Psychiatric:        Mood and Affect: Mood normal.        Behavior: Behavior normal.        Thought Content: Thought content normal.        Judgment: Judgment normal.        Assessment & Plan:   1. Injury of left foot, initial encounter     Injury of left foot Xray- IMPRESSION: New fractures of the distal third and fourth metatarsals though potentially nonacute. Pt instructed to remain non-weight bearing as  much as possible until seen by Orthopedic, referral placed. Post Op Show ordered, rx faxed to [facility] reviewed- Oxycodone 15mg  - 5 tablets daily She also takes Lyrica, Trazodone 50mg  1-3 tablets QHS and additional insomnia med, pt unsure of Rx name. Instructed to check bottles at home and call back with rx information. Rest, Ice, and elevate left foot. Continue to use pain medications as directed by pain management. Recommend reducing insomnia medication and other sedating medication to reduce likelihood of falls. Continue with Pain Management as directed.     FOLLOW-UP:  Return if symptoms worsen or fail to improve.

## 2018-09-06 ENCOUNTER — Telehealth: Payer: Self-pay

## 2018-09-06 NOTE — Telephone Encounter (Signed)
-----   Message from Esaw Grandchild, NP sent at 09/06/2018  8:07 AM EST ----- Regarding: Post Op Shoe Please also let Ms. Garvey know that we faxed over order for [facility] Thanks!Valetta Fuller

## 2018-09-06 NOTE — Addendum Note (Signed)
Addended by: Mina Marble D on: 09/06/2018 08:08 AM   Modules accepted: Orders

## 2018-09-06 NOTE — Telephone Encounter (Signed)
Patient's husband was notified and DME order was faxed to [facility] [address]. MPulliam, CMA/RT(R)

## 2018-09-10 ENCOUNTER — Other Ambulatory Visit: Payer: Self-pay

## 2018-09-10 ENCOUNTER — Telehealth: Payer: Self-pay | Admitting: Adult Health

## 2018-09-10 ENCOUNTER — Other Ambulatory Visit: Payer: Self-pay | Admitting: Adult Health

## 2018-09-10 DIAGNOSIS — S99922A Unspecified injury of left foot, initial encounter: Secondary | ICD-10-CM

## 2018-09-10 NOTE — Progress Notes (Signed)
Error. MPulliam, CMA/RT(R)  

## 2018-09-10 NOTE — Telephone Encounter (Signed)
Noted new order sent to [facility], CMA/RT(R)

## 2018-09-10 NOTE — Telephone Encounter (Signed)
[name] husband called states they drove to [facility] on [facility] but couldn't find/ they called ph# but it's now disconnected.  --- Patient ask if provider could suggest different place-- I called [facility] on [address] after trying several pharmacies (they sell the boot.)-- --( [facility] information given to pt's husband w/ph# for him to call /go to pick up.   ---Forwarding message to medical assistant to send Order for patient if one is required  --glh

## 2018-09-11 ENCOUNTER — Ambulatory Visit (INDEPENDENT_AMBULATORY_CARE_PROVIDER_SITE_OTHER): Payer: Medicare Other | Admitting: Orthopedic Surgery

## 2018-09-11 ENCOUNTER — Other Ambulatory Visit: Payer: Self-pay | Admitting: Neurology

## 2018-09-13 ENCOUNTER — Ambulatory Visit (INDEPENDENT_AMBULATORY_CARE_PROVIDER_SITE_OTHER): Payer: Medicare Other | Admitting: Physician Assistant

## 2018-09-20 ENCOUNTER — Other Ambulatory Visit: Payer: Self-pay | Admitting: Adult Health

## 2018-10-17 ENCOUNTER — Telehealth: Payer: Self-pay | Admitting: *Deleted

## 2018-10-17 NOTE — Telephone Encounter (Signed)
PA approved through 01/14/2019.

## 2018-10-17 NOTE — Telephone Encounter (Signed)
PA for Aimovig started on covermymeds (key: [id number]).  Pt has pharmacy coverage with [employer] - [phone] [facility] Medicare plan).  ID: [id number].  [id number].  Decision pending.

## 2018-12-16 ENCOUNTER — Other Ambulatory Visit: Payer: Self-pay | Admitting: Neurology

## 2018-12-20 ENCOUNTER — Other Ambulatory Visit: Payer: Self-pay | Admitting: Adult Health

## 2018-12-20 NOTE — Telephone Encounter (Signed)
Please call and let the patient know, that from what I can see, she has not had any labs in our system since 2018.    Thyroid levels should be checked yearly and hence, we are only giving her ONE 30-day supply ( no more RF)  and she will need to have a telehealth visit if she would like further refills and continue her care

## 2018-12-23 NOTE — Telephone Encounter (Signed)
Called and left message for the patient to call the office. MPulliam, CMA/RT(R)  

## 2018-12-25 ENCOUNTER — Encounter: Payer: Self-pay | Admitting: Adult Health

## 2018-12-25 ENCOUNTER — Other Ambulatory Visit: Payer: Self-pay

## 2018-12-25 ENCOUNTER — Ambulatory Visit (INDEPENDENT_AMBULATORY_CARE_PROVIDER_SITE_OTHER): Payer: Medicare Other | Admitting: Adult Health

## 2018-12-25 VITALS — Temp 96.6°F | Ht 65.0 in | Wt 183.0 lb

## 2018-12-25 DIAGNOSIS — Z0000 Encounter for general adult medical examination without abnormal findings: Secondary | ICD-10-CM

## 2018-12-25 DIAGNOSIS — R739 Hyperglycemia, unspecified: Secondary | ICD-10-CM

## 2018-12-25 DIAGNOSIS — Z515 Encounter for palliative care: Secondary | ICD-10-CM | POA: Insufficient documentation

## 2018-12-25 DIAGNOSIS — I959 Hypotension, unspecified: Secondary | ICD-10-CM | POA: Diagnosis not present

## 2018-12-25 DIAGNOSIS — Z23 Encounter for immunization: Secondary | ICD-10-CM | POA: Diagnosis not present

## 2018-12-25 DIAGNOSIS — E039 Hypothyroidism, unspecified: Secondary | ICD-10-CM

## 2018-12-25 DIAGNOSIS — D509 Iron deficiency anemia, unspecified: Secondary | ICD-10-CM

## 2018-12-25 MED ORDER — TETANUS-DIPHTH-ACELL PERTUSSIS 5-2-15.5 LF-MCG/0.5 IM SUSP: 0.5000 mL | Freq: Once | INTRAMUSCULAR | 0 refills | Status: AC

## 2018-12-25 MED ORDER — ZOSTER VAC RECOMB ADJUVANTED 50 MCG/0.5ML IM SUSR: 0.5000 mL | Freq: Once | INTRAMUSCULAR | 0 refills | Status: AC

## 2018-12-25 NOTE — Progress Notes (Signed)
Virtual Visit via Telephone Note  I connected with [name] on 12/25/18 at  3:45 PM EDT by telephone and verified that I am speaking with the correct person using two identifiers.   I discussed the limitations, risks, security and privacy concerns of performing an evaluation and management service by telephone and the availability of in person appointments.The staff discussed with the patient that there may be a patient responsible charge related to this service. The patient expressed understanding and agreed to proceed.  Location of Patient- Home Location of Provider- In Clinic   Subjective:   [name] is a 73 y.o. female who presents for Medicare Annual (Subsequent) preventive examination.  Review of Systems: General:   No F/C, wt loss Pulm:   No DIB, SOB, pleuritic chest pain Card:  No CP, palpitations Abd:  No n/v/d or pain Ext:  No inc edema from baseline      Objective:     Vitals: Temp (!) 96.6 F (35.9 C) (Oral)   Ht 5\' 5"  (1.651 m)   Wt 183 lb (83 kg)   BMI 30.45 kg/m   Body mass index is 30.45 kg/m.  Advanced Directives 09/12/2016 08/22/2016 10/12/2015 07/02/2015 05/08/2013  Does Patient Have a Medical Advance Directive? No No No Yes;No Patient does not have advance directive;Patient would like information  Does patient want to make changes to medical advance directive? - - Yes - information given - -  Would patient like information on creating a medical advance directive? Yes (MAU/Ambulatory/Procedural Areas - Information given) - - No - patient declined information Advance directive packet given    Tobacco Social History   Tobacco Use  Smoking Status Never Smoker  Smokeless Tobacco Never Used     Counseling given: Not Answered   Past Medical History:  Diagnosis Date  . Anemia    took Fe- orally 2 yrs. ago   . Anxiety    panic attacks   . Arthritis    shoulders, knees  . Cancer ([place])    skin- preCA  . Depression   . GERD (gastroesophageal  reflux disease)    uses Protonix as needed   . Headache(784.0)   . Humerus fracture    Right   . Hyposmolality and/or hyponatremia 02/27/2013  . Hypothyroidism   . IBS (irritable bowel syndrome)   . Memory loss   . Mental disorder   . Migraine   . Preoperative examination 02/26/2013  . Seizures ([place])    caused by depression , seritonin seizure - effected short term memory    . Shortness of breath    Past Surgical History:  Procedure Laterality Date  . ABDOMINAL HYSTERECTOMY    . APPENDECTOMY    . BACK SURGERY  1990's  . BREAST SURGERY  1972   augmentation   . CHOLECYSTECTOMY    . GASTRECTOMY  1994   Due to ulcer, required multiple revisions.   Marland Kitchen HERNIA REPAIR    . ORIF ANKLE FRACTURE Right 2004  . REPLACEMENT TOTAL KNEE    . REVERSE SHOULDER ARTHROPLASTY Right 05/16/2013   Procedure: RIGHT HEMI ARTHROPLASTY  VERSES  REVERSE TOTAL SHOULDER ARTHROPLASTY ;  Surgeon: Augustin Schooling, MD;  Location: [facility];  Service: Orthopedics;  Laterality: Right;  . SPINE SURGERY  2005   Lumbar spinal stenosis   . STOMACH SURGERY     1/3 resection for obstruction  . TONSILLECTOMY    . TUBAL LIGATION     Family History  Problem Relation Age  of Onset  . Aneurysm Mother   . Heart disease Mother   . Cancer Father        pancreatic  . Stroke Father 45  . Hypertension Father   . Alcohol abuse Father   . Heart disease Maternal Grandfather    Social History   Socioeconomic History  . Marital status: Married    Spouse name: Not on file  . Number of children: 1  . Years of education: College  . Highest education level: Not on file  Occupational History  . Occupation: Retired  Scientific laboratory technician  . Financial resource strain: Not on file  . Food insecurity:    Worry: Not on file    Inability: Not on file  . Transportation needs:    Medical: Not on file    Non-medical: Not on file  Tobacco Use  . Smoking status: Never Smoker  . Smokeless tobacco: Never Used  Substance and Sexual Activity   . Alcohol use: No    Comment: rare use of white wine   . Drug use: No  . Sexual activity: Not on file  Lifestyle  . Physical activity:    Days per week: Not on file    Minutes per session: Not on file  . Stress: Not on file  Relationships  . Social connections:    Talks on phone: Not on file    Gets together: Not on file    Attends religious service: Not on file    Active member of club or organization: Not on file    Attends meetings of clubs or organizations: Not on file    Relationship status: Not on file  Other Topics Concern  . Not on file  Social History Narrative   Lives at home with her husband.   2 cups caffeine daily.   Right-handed.    Outpatient Encounter Medications as of 12/25/2018  Medication Sig  . Coenzyme Q10 (CO Q-10) 300 MG CAPS Take 300 mg daily by mouth.   Eduard Roux (AIMOVIG) 70 MG/ML SOAJ Inject 70 mg into the skin every 30 (thirty) days.  . fluticasone (FLONASE) 50 MCG/ACT nasal spray Place 2 sprays into the nose daily. (Patient taking differently: Place 2 sprays daily as needed into both nostrils for allergies. )  . furosemide (LASIX) 20 MG tablet Take 1 tab daily only when needed for lower extremity swelling or increase in weight (Patient taking differently: Take 20 mg daily as needed by mouth (for swelling of the legs). )  . levothyroxine (SYNTHROID) 100 MCG tablet Take 1 tablet (100 mcg total) by mouth daily for 30 days. (Needs bldwrk and OV for RF)  . Multiple Vitamins-Minerals (CENTRUM SILVER PO) Take 1 tablet by mouth daily.   . ondansetron (ZOFRAN) 4 MG tablet TAKE 1 TABLET BY MOUTH EVERY 8 HOURS AS NEEDED FOR MIGRAINE PER DR MANN  . pregabalin (LYRICA) 150 MG capsule TAKE 1 CAPSULE BY MOUTH THREE TIMES DAILY  . S-Adenosylmethionine (MOOD PLUS SAM-E DOUBLE ST) 400 MG TBEC Take 1 tablet by mouth daily.  Marland Kitchen tiZANidine (ZANAFLEX) 4 MG tablet TAKE 1/2 TO 1 TABLET BY MOUTH TWICE DAILY AS NEEDED  . topiramate (TOPAMAX) 100 MG tablet TAKE 1 TABLET  BY MOUTH TWICE DAILY  . traZODone (DESYREL) 50 MG tablet Take 50-150 mg at bedtime as needed by mouth for sleep (and may take one additional 50 mg tablet daily AS NEEDED for part of a "headache cocktail").   . Turmeric (RA TURMERIC) 500 MG CAPS Take  500 mg by mouth daily.   Marland Kitchen venlafaxine (EFFEXOR) 37.5 MG tablet Take 1 tablet (37.5 mg total) by mouth 2 (two) times daily.  Marland Kitchen vortioxetine HBr (TRINTELLIX) 20 MG TABS Take 20 mg by mouth daily.  Marland Kitchen oxyCODONE (ROXICODONE) 15 MG immediate release tablet Take 1 tablet by mouth. May take up to 5 times daily  . ramelteon (ROZEREM) 8 MG tablet Take 1 tablet by mouth daily.  . Tdap (ADACEL) 01-03-14.5 LF-MCG/0.5 injection Inject 0.5 mLs into the muscle once for 1 dose.  Marland Kitchen Zoster Vaccine Adjuvanted [facility]) injection Inject 0.5 mLs into the muscle once for 1 dose.   No facility-administered encounter medications on file as of 12/25/2018.     Activities of Daily Living In your present state of health, do you have any difficulty performing the following activities: 12/25/2018  Hearing? N  Vision? N  Difficulty concentrating or making decisions? Y  Walking or climbing stairs? N  Dressing or bathing? N  Doing errands, shopping? Y  Comment Doesn't drive  Some recent data might be hidden    Patient Care Team: Esaw Grandchild, NP as PCP - General (Family Medicine) Lynda Rainwater Hazle Quant, LCSW as Social Worker (Licensed Clinical Social Worker) Chucky May, MD as Consulting Physician (Psychiatry) Donzetta Sprung., MD as Physician Assistant ([place]) Margaretmary Lombard, PhD as Referring Physician (Psychology) Juanita Craver, MD as Consulting Physician (Gastroenterology)    Assessment:   This is a routine wellness examination for Laural.  Exercise Activities and Dietary recommendations  Walking daily around house and on back deck when weather permitting Recommend adding in daily seated stretching  Goals    . Exercise 6x per week (15 min per  time)    . Weight (lb) < 195 lb (88.5 kg)     7 % weight loss       Fall Risk Fall Risk  12/25/2018 09/05/2018 09/12/2016 10/12/2015 07/02/2015  Falls in the past year? 0 1 Yes Yes Yes  Number falls in past yr: - 0 1 - 2 or more  Comment - - - - -  Injury with Fall? - 1 No - No  Risk Factor Category  - - - - -  Risk for fall due to : - Impaired balance/gait;Impaired mobility History of fall(s);Impaired balance/gait;Impaired mobility - -  Follow up - Falls evaluation completed Falls evaluation completed;Education provided;Falls prevention discussed - -   Is the patient's home free of loose throw rugs in walkways, pet beds, electrical cords, etc?   yes      Grab bars in the bathroom? yes      Handrails on the stairs?   yes      Adequate lighting?   yes  Timed Get Up and Go performed: N/A, encounter not performed in clinic   Depression Screen PHQ 2/9 Scores 12/25/2018 03/12/2018 07/25/2017 09/12/2016  PHQ - 2 Score 0 4 2 6   PHQ- 9 Score 0 11 6 21      Cognitive Function MMSE - Mini Mental State Exam 05/07/2018 06/11/2017 11/06/2016  Orientation to time 1 1 3   Orientation to Place 4 4 4   Registration 3 3 3   Attention/ Calculation 2 3 2   Recall 2 2 2   Language- name 2 objects 2 2 2   Language- repeat 1 1 1   Language- follow 3 step command 3 3 3   Language- read & follow direction 1 1 1   Write a sentence 1 1 1   Copy design 1 1 0  Total score 21 22  22     6CIT Screen 12/25/2018  What Year? 4 points  What month? 3 points  What time? 3 points  Count back from 20 0 points  Months in reverse 4 points  Repeat phrase 8 points  Total Score 22    Immunization History  Administered Date(s) Administered  . Influenza Whole 06/10/2007, 06/03/2009, 07/15/2010  . Influenza, High Dose Seasonal PF 11/12/2017  . Influenza,inj,Quad PF,6+ Mos 09/17/2014, 07/02/2015, 08/22/2016  . Pneumococcal Conjugate-13 07/02/2015  . Pneumococcal Polysaccharide-23 08/11/2010  . Td 02/03/2003    Qualifies for  Shingles Vaccine?yes  Screening Tests Health Maintenance  Topic Date Due  . TETANUS/TDAP  09/06/2019 (Originally 02/02/2013)  . INFLUENZA VACCINE  04/05/2019  . COLONOSCOPY  06/05/2023  . DEXA SCAN  Completed  . Hepatitis C Screening  Completed  . PNA vac Low Risk Adult  Completed    Cancer Screenings: Lung: Low Dose CT Chest recommended if Age 51-80 years, 30 pack-year currently smoking OR have quit w/in 15years. Patient does not qualify. Breast:  Up to date on Mammogram? Yes   Up to date of Bone Density/Dexa? Yes Colorectal: Needs to be updated, referral to Gastroenterology placed   Additional Screenings:  Hepatitis C Screening:      Plan:  Continue all medications as directed. Remain well hydrated and follow heart healthy diet Continue to walk daily around house/on back beck Keep Neurology f/u May 2020 Schedule primary care f/u with fasting labs 3 months Continue to self isolate per COVID-19 Stay at Home orders  I have personally reviewed and noted the following in the patient's chart:   . Medical and social history . Use of alcohol, tobacco or illicit drugs  . Current medications and supplements . Functional ability and status . Nutritional status . Physical activity . Advanced directives . List of other physicians . Hospitalizations, surgeries, and ER visits in previous 12 months . Vitals . Screenings to include cognitive, depression, and falls . Referrals and appointments  In addition, I have reviewed and discussed with patient certain preventive protocols, quality metrics, and best practice recommendations. A written personalized care plan for preventive services as well as general preventive health recommendations were provided to patient.   I discussed the assessment and treatment plan with the patient. The patient was provided an opportunity to ask questions and all were answered. The patient agreed with the plan and demonstrated an understanding of the  instructions.   The patient was advised to call back or seek an in-person evaluation if the symptoms worsen or if the condition fails to improve as anticipated.  I provided 25 minutes of non-face-to-face time during this encounter.   Esaw Grandchild, NP    Esaw Grandchild, NP  12/25/2018

## 2018-12-25 NOTE — Assessment & Plan Note (Signed)
Continue all medications as directed. Remain well hydrated and follow heart healthy diet Continue to walk daily around house/on back beck Keep Neurology f/u May 2020 Schedule primary care f/u with fasting labs 3 months Continue to self isolate per COVID-19 Stay at Home orders  I have personally reviewed and noted the following in the patient's chart:   . Medical and social history . Use of alcohol, tobacco or illicit drugs  . Current medications and supplements . Functional ability and status . Nutritional status . Physical activity . Advanced directives . List of other physicians . Hospitalizations, surgeries, and ER visits in previous 12 months . Vitals . Screenings to include cognitive, depression, and falls . Referrals and appointments  In addition, I have reviewed and discussed with patient certain preventive protocols, quality metrics, and best practice recommendations. A written personalized care plan for preventive services as well as general preventive health recommendations were provided to patient.   I discussed the assessment and treatment plan with the patient. The patient was provided an opportunity to ask questions and all were answered. The patient agreed with the plan and demonstrated an understanding of the instructions.   The patient was advised to call back or seek an in-person evaluation if the symptoms worsen or if the condition fails to improve as anticipated.

## 2019-01-06 ENCOUNTER — Telehealth: Payer: Self-pay

## 2019-01-06 DIAGNOSIS — G894 Chronic pain syndrome: Secondary | ICD-10-CM | POA: Diagnosis not present

## 2019-01-06 DIAGNOSIS — M545 Low back pain: Secondary | ICD-10-CM | POA: Diagnosis not present

## 2019-01-06 NOTE — Telephone Encounter (Signed)
We received a prior authorization request for Aimovig 70mg /mL. I have completed and submitted the PA on Cover My Meds and should have a determination within 48-72 hours.  Cover My Meds Key: [id number]

## 2019-01-16 ENCOUNTER — Telehealth: Payer: Self-pay

## 2019-01-16 NOTE — Telephone Encounter (Signed)
Unable to get in contact with the patient to convert their office visit with Judson Roch on 01/21/2019 into a doxy.me visit. I left a voicemail asking the patient to return my call. Office number was provided.   If patient calls back please convert their office visit into a doxy.me visit.

## 2019-01-16 NOTE — Telephone Encounter (Signed)
Patient has been converted

## 2019-01-20 ENCOUNTER — Other Ambulatory Visit: Payer: Self-pay | Admitting: Neurology

## 2019-01-21 ENCOUNTER — Ambulatory Visit (INDEPENDENT_AMBULATORY_CARE_PROVIDER_SITE_OTHER): Payer: Medicare Other | Admitting: Neurology

## 2019-01-21 ENCOUNTER — Ambulatory Visit: Payer: Medicare Other | Admitting: Adult Health

## 2019-01-21 ENCOUNTER — Other Ambulatory Visit: Payer: Self-pay

## 2019-01-21 ENCOUNTER — Encounter: Payer: Self-pay | Admitting: Neurology

## 2019-01-21 DIAGNOSIS — G43911 Migraine, unspecified, intractable, with status migrainosus: Secondary | ICD-10-CM | POA: Diagnosis not present

## 2019-01-21 DIAGNOSIS — R269 Unspecified abnormalities of gait and mobility: Secondary | ICD-10-CM

## 2019-01-21 DIAGNOSIS — R413 Other amnesia: Secondary | ICD-10-CM | POA: Diagnosis not present

## 2019-01-21 MED ORDER — ERENUMAB-AOOE 140 MG/ML ~~LOC~~ SOAJ: 140.0000 mg | SUBCUTANEOUS | 5 refills | Status: DC

## 2019-01-21 MED ORDER — PREGABALIN 150 MG PO CAPS: 150.0000 mg | ORAL_CAPSULE | Freq: Three times a day (TID) | ORAL | 1 refills | Status: DC

## 2019-01-21 NOTE — Progress Notes (Addendum)
Virtual Visit via Video Note  I connected with [name] on 01/21/19 at  1:45 PM EDT by a video enabled telemedicine application and verified that I am speaking with the correct person using two identifiers.  Location: Patient: At her home Provider: At my home    I discussed the limitations of evaluation and management by telemedicine and the availability of in person appointments. The patient expressed understanding and agreed to proceed.  History of Present Illness: [name] is a 73 years old right-handed female, accompanied by her husband [name],  seen in refer by her primary care physician nurse practitioner [name] for evaluation of migraine, memory loss, initial evaluation was on November 06 2016.  I reviewed and summarized the referring note, she had a history of hypothyroidism, on supplement, reported history of seizure, one was associated with Wellbutrin in 2008, the other was called serotonin syndrome due to polypharmacy for her long-time depression, she is now taking Trintellix 20mg  daily since Jan 2018,   She had 18 years education, she was an Training and development officer, retired at 2008,  she suffered serotonin syndrome, hypoxia encephalopathy, was in coma for 3 days, require intubation, since she recovered from that episode, she was noted to have short-term memory trouble, which has been stable over the past 10 years, there are good days and bad days, but overall there was no significant sudden changes.  She reported lifelong history of migraine headaches, her typical migraine are right side severe pounding headache with associated light noise sensitivity, nauseous, lasting for 3 days, she was previously treated by neurologist at [facility] with Lyrica 400 mg daily, tizanidine Frova as needed, oxycodone 20 mg 4 times a day,  Her physician changed at the beginning of the year, she was tapered off oxycodone, since then, she complained of frequent headaches, about once a week severe  right side migraine headache, lasting for 3 days, has to go to emergency room,  She has chronic gait abnormality due to right knee pain, she had right knee replacement, left knee severe arthritis disease.B12 level was 1300,  Update December 05 2016: Reviewed laboratory evaluation on November 06 2016,no significant abnormality, CMP, C-reactive protein, folic acid, negative RPR, We personally reviewed MRI of the brain without contrast on November 19 2016: Moderate cortical atrophy, no significant change compared to previous scan in 2011, chronic bilateral parietal lobe encephalomalacia, chronic hemo-product on the left side, chronic microvascular ischemic changes in the cerebral hemisphere, and in the left cerebellar hemisphere,  She is taking Diclofenac prn for her headaches. She complains almost daily bilateral retro-orbital area pressure pain, lasting for a few hours,  She also complains of increased, gradual worsening gait abnormality, urinary incontinence.  UPDATE Jun 11 2017: She complains of constant headache,  lateralized severe pounding headache, lasting for 2 days, she is now taking frequent almost daily diclofenac without much help, she is also taking Topamax 100 mg every day at nighttime as preventive medication, which did not make much difference,  She has tried BOTOX Injection in the past, without help, she is not a candidate for triptan treatment because evidence of previous stroke, multiple vascular risk factors  MRI of cervical in April 2018 showed evidence of multilevel degenerative changes, most severe at C4-5, C5-6, with evidence of disc bulging, severe bilateral foraminal narrowing  UPDATE Sept 3 2019: Her insurance denies Amovig, she is now taking effexor 37.5mg  bid, which did help her migraine some, she reported mild improvement, she is now  having migraine 2 times a week, sleeps helps her migraine, she is now taking oycodone 15mg  up to five times a day, diclofenac as needed.   She still has trouble with her memory. MMSE is 21/30 today.  Update Jan 21, 2019 SS: She indicates she has been doing well, she reports her headaches are the same, having 3-4 headaches a month, they may last 24 hours.  She did find Aimovig injections, they say they did not see much change.  Her husband reports he has not seen much change in her memory.  She does her own ADLs, does some housework, not much cooking.  She does walk with a walker, she says for chronic back pain and for stability.  She says there is nothing wrong with her legs.  Her short-term memory is not good.  In the past she has tried Botox for headaches and it was not helpful.  During the day she likes to watch TV and sleep.  Her husband saw [name] advertised on TV, is wondering if this might be a good medication for her.   Observations/Objective: Appears to be in good spirits, in the bed, alert, facial symmetry noted, fair historian, disoriented to date month year and day.  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  Her husband reports no real change in memory.  MMSE was 21/30 in September 2019.  2. Chronic migraine headaches   We will try Aimovig 140 mg monthly injection. I think she might be a good candidate for [name], since triptan is contraindicated with her history stroke, age, vascular risk factor. Will discuss with Dr. Krista Blue. She will continue on Lyrica 150 mg three times daily, topamax 100 mg twice daily, effexor 37.5 mg twice daily. Will refill Lyrica.   3. Gait abnormality  Continue using walker for safety, due to chronic  back pain. She see's Dr. Nelva Bush for chronic back pain. Completed PT last fall, try to do home exercises.   Addendum 01/22/2019: I talked with Dr. Krista Blue. We can try Ubrelvy 50 mg tablets, max 100 mg/day, not to take more than 8 days a month. I am not sure Medicare will pay, may require a PA. I left a message on the patients voicemail. I have sent in the prescription.   She is not a candidate for triptans due to her age, vascular risk factors, history of stroke. She has tried NSAIDS, zofran, tizanidine, benadryl, cataflam for acute headache treatment, she is having about 3-4 migraines a month lasting 24 hours.   Follow Up Instructions: 4 months, 05/27/2019 3:00 with Dr. Krista Blue    I discussed the assessment and treatment plan with the patient. The patient was provided an opportunity to ask questions and all were answered. The patient agreed with the plan and demonstrated an understanding of the instructions.   The patient was advised to call back or seek an in-person evaluation if the symptoms worsen or if the condition fails to improve as anticipated.  I provided 30 minutes of non-face-to-face time during this encounter.   [name], AGNP-C, DNP  [facility] [address], [place], [place] [address] [phone]

## 2019-01-22 ENCOUNTER — Telehealth: Payer: Self-pay

## 2019-01-22 ENCOUNTER — Other Ambulatory Visit: Payer: Self-pay | Admitting: Family Medicine

## 2019-01-22 MED ORDER — UBROGEPANT 50 MG PO TABS: 50.0000 mg | ORAL_TABLET | ORAL | 11 refills | Status: DC | PRN

## 2019-01-22 NOTE — Telephone Encounter (Signed)
Afternoon [name], She has OV in August, but I agree she needs fasting labs completed prior to refill. Last TSH was >1 year. Can you please have her schedule fasting lab appt ASAP. Thanks! Valetta Fuller

## 2019-01-22 NOTE — Telephone Encounter (Signed)
Pt's spouse informed.  Lab appt scheduled.  Charyl Bigger, CMA

## 2019-01-22 NOTE — Telephone Encounter (Signed)
Per Dr. Hershal Coria last refill authorization for this medication, pt needed OV and labs prior to any further refills.  Given the current pandemic, do you want to bring pt in for labs prior to any further refills?  Charyl Bigger, CMA

## 2019-01-22 NOTE — Addendum Note (Signed)
Addended by: Suzzanne Cloud on: 01/22/2019 08:46 AM   Modules accepted: Orders

## 2019-01-22 NOTE — Telephone Encounter (Signed)
I will send in 15 tablets of Levothyroxine to cover her until she can get in for fasting labs Thanks! Valetta Fuller

## 2019-01-22 NOTE — Telephone Encounter (Signed)
Pending approval for Ubrelvy 50 mg tablets Key: [id number]  Rx #: [id number]  I will update once a decision has been made.

## 2019-01-22 NOTE — Progress Notes (Signed)
I have reviewed and agreed above plan. 

## 2019-01-23 NOTE — Telephone Encounter (Signed)
Can we try to appeal on the fact that she is not a candidate for triptans based on age, vascular risk factors, stroke history.

## 2019-01-23 NOTE — Telephone Encounter (Signed)
[name] was denied due to the patient not trying and failing 2 triptans (Naratriptan, Rizatriptan and Sumatriptan)

## 2019-01-28 ENCOUNTER — Other Ambulatory Visit: Payer: Self-pay | Admitting: Adult Health

## 2019-01-28 ENCOUNTER — Telehealth: Payer: Self-pay

## 2019-01-28 NOTE — Telephone Encounter (Signed)
Please call pt to schedule lab only visit.  No further refills on her thyroid medication until she has these labs drawn.  Charyl Bigger, CMA

## 2019-01-29 ENCOUNTER — Other Ambulatory Visit: Payer: Self-pay

## 2019-01-30 NOTE — Telephone Encounter (Signed)
Appeal process has been started. Waiting on a decision now. Key: [id number]

## 2019-02-04 ENCOUNTER — Other Ambulatory Visit (INDEPENDENT_AMBULATORY_CARE_PROVIDER_SITE_OTHER): Payer: Medicare Other

## 2019-02-04 ENCOUNTER — Other Ambulatory Visit: Payer: Self-pay

## 2019-02-04 DIAGNOSIS — I959 Hypotension, unspecified: Secondary | ICD-10-CM

## 2019-02-04 DIAGNOSIS — Z0000 Encounter for general adult medical examination without abnormal findings: Secondary | ICD-10-CM | POA: Diagnosis not present

## 2019-02-04 DIAGNOSIS — E039 Hypothyroidism, unspecified: Secondary | ICD-10-CM | POA: Diagnosis not present

## 2019-02-04 DIAGNOSIS — D509 Iron deficiency anemia, unspecified: Secondary | ICD-10-CM | POA: Diagnosis not present

## 2019-02-04 DIAGNOSIS — R739 Hyperglycemia, unspecified: Secondary | ICD-10-CM

## 2019-02-05 ENCOUNTER — Other Ambulatory Visit: Payer: Self-pay | Admitting: Adult Health

## 2019-02-05 LAB — CBC WITH DIFFERENTIAL/PLATELET
Basophils Absolute: 0.1 10*3/uL (ref 0.0–0.2)
Basos: 1 %
EOS (ABSOLUTE): 0.1 10*3/uL (ref 0.0–0.4)
Eos: 2 %
Hematocrit: 34.9 % (ref 34.0–46.6)
Hemoglobin: 11.2 g/dL (ref 11.1–15.9)
Immature Grans (Abs): 0 10*3/uL (ref 0.0–0.1)
Immature Granulocytes: 0 %
Lymphocytes Absolute: 2 10*3/uL (ref 0.7–3.1)
Lymphs: 33 %
MCH: 24.4 pg — ABNORMAL LOW (ref 26.6–33.0)
MCHC: 32.1 g/dL (ref 31.5–35.7)
MCV: 76 fL — ABNORMAL LOW (ref 79–97)
Monocytes Absolute: 0.6 10*3/uL (ref 0.1–0.9)
Monocytes: 9 %
Neutrophils Absolute: 3.4 10*3/uL (ref 1.4–7.0)
Neutrophils: 55 %
Platelets: 171 10*3/uL (ref 150–450)
RBC: 4.59 x10E6/uL (ref 3.77–5.28)
RDW: 16.2 % — ABNORMAL HIGH (ref 11.7–15.4)
WBC: 6.2 10*3/uL (ref 3.4–10.8)

## 2019-02-05 LAB — HEMOGLOBIN A1C
Est. average glucose Bld gHb Est-mCnc: 117 mg/dL
Hgb A1c MFr Bld: 5.7 % — ABNORMAL HIGH (ref 4.8–5.6)

## 2019-02-05 LAB — COMPREHENSIVE METABOLIC PANEL
ALT: 10 IU/L (ref 0–32)
AST: 22 IU/L (ref 0–40)
Albumin/Globulin Ratio: 2 (ref 1.2–2.2)
Albumin: 4.1 g/dL (ref 3.7–4.7)
Alkaline Phosphatase: 70 IU/L (ref 39–117)
BUN/Creatinine Ratio: 16 (ref 12–28)
BUN: 13 mg/dL (ref 8–27)
Bilirubin Total: 0.4 mg/dL (ref 0.0–1.2)
CO2: 23 mmol/L (ref 20–29)
Calcium: 8.9 mg/dL (ref 8.7–10.3)
Chloride: 105 mmol/L (ref 96–106)
Creatinine, Ser: 0.81 mg/dL (ref 0.57–1.00)
GFR calc Af Amer: 82 mL/min/{1.73_m2} (ref >59)
GFR calc non Af Amer: 71 mL/min/{1.73_m2} (ref >59)
Globulin, Total: 2.1 g/dL (ref 1.5–4.5)
Glucose: 83 mg/dL (ref 65–99)
Potassium: 4.5 mmol/L (ref 3.5–5.2)
Sodium: 140 mmol/L (ref 134–144)
Total Protein: 6.2 g/dL (ref 6.0–8.5)

## 2019-02-05 LAB — TSH: TSH: 1.18 u[IU]/mL (ref 0.450–4.500)

## 2019-02-05 LAB — LIPID PANEL
Chol/HDL Ratio: 2.2 ratio (ref 0.0–4.4)
Cholesterol, Total: 179 mg/dL (ref 100–199)
HDL: 83 mg/dL (ref >39)
LDL Calculated: 78 mg/dL (ref 0–99)
Triglycerides: 89 mg/dL (ref 0–149)
VLDL Cholesterol Cal: 18 mg/dL (ref 5–40)

## 2019-02-11 NOTE — Telephone Encounter (Signed)
Received a decision on the approval for [name] and it was approved through 09-04-2019. A copy has been faxed to the patient's pharmacy. Confirmation fax has been received. Patient has been notified via [employer].

## 2019-02-11 NOTE — Telephone Encounter (Signed)
Noted. Thanks for letting her her know.

## 2019-02-12 ENCOUNTER — Other Ambulatory Visit: Payer: Self-pay | Admitting: Adult Health

## 2019-03-03 ENCOUNTER — Telehealth: Payer: Self-pay | Admitting: Adult Health

## 2019-03-04 ENCOUNTER — Telehealth: Payer: Self-pay | Admitting: Adult Health

## 2019-03-04 NOTE — Telephone Encounter (Signed)
Advised pt's son that I have checked with multiple agencies in the area and no home health agencies will make daily visits to administer pt's medications.  Pt's son requested a medication list, which was printed and left at front desk for pick up.  Charyl Bigger, CMA

## 2019-03-04 NOTE — Telephone Encounter (Signed)
LVM for pt's son to return call.  Charyl Bigger, CMA

## 2019-03-04 NOTE — Telephone Encounter (Signed)
Per Pt's son / [name] , Mr. [name] husband (pt's husband) is in the hospital ------  he (son) taking care of Patient & needs Home Health assistance with mom.  --Forwarding message to medical assistance that (new) DPR signed by patient granting son permission to spk w/provider.  Vida Roller Pandora Leiter @ [phone]  --Dion Body

## 2019-04-22 ENCOUNTER — Ambulatory Visit: Payer: Medicare Other | Admitting: Adult Health

## 2019-05-22 ENCOUNTER — Other Ambulatory Visit: Payer: Self-pay | Admitting: Neurology

## 2019-05-27 ENCOUNTER — Ambulatory Visit: Payer: Medicare Other | Admitting: Neurology

## 2019-06-26 ENCOUNTER — Ambulatory Visit (INDEPENDENT_AMBULATORY_CARE_PROVIDER_SITE_OTHER): Payer: Medicare Other | Admitting: Adult Health

## 2019-06-26 ENCOUNTER — Encounter: Payer: Self-pay | Admitting: Adult Health

## 2019-06-26 ENCOUNTER — Other Ambulatory Visit: Payer: Self-pay

## 2019-06-26 VITALS — Temp 97.0°F

## 2019-06-26 DIAGNOSIS — F1120 Opioid dependence, uncomplicated: Secondary | ICD-10-CM | POA: Diagnosis not present

## 2019-06-26 DIAGNOSIS — R7989 Other specified abnormal findings of blood chemistry: Secondary | ICD-10-CM

## 2019-06-26 DIAGNOSIS — R4189 Other symptoms and signs involving cognitive functions and awareness: Secondary | ICD-10-CM | POA: Diagnosis not present

## 2019-06-26 DIAGNOSIS — D509 Iron deficiency anemia, unspecified: Secondary | ICD-10-CM

## 2019-06-26 DIAGNOSIS — Z0000 Encounter for general adult medical examination without abnormal findings: Secondary | ICD-10-CM

## 2019-06-26 DIAGNOSIS — R269 Unspecified abnormalities of gait and mobility: Secondary | ICD-10-CM | POA: Diagnosis not present

## 2019-06-26 DIAGNOSIS — R413 Other amnesia: Secondary | ICD-10-CM

## 2019-06-26 DIAGNOSIS — R748 Abnormal levels of other serum enzymes: Secondary | ICD-10-CM

## 2019-06-26 NOTE — Progress Notes (Signed)
Virtual Visit via Telephone Note  I connected with [name] on 06/27/19 at  3:15 PM EDT by telephone and verified that I am speaking with the correct person using two identifiers.  Location: Patient: Home Provider: In Clinic   I discussed the limitations, risks, security and privacy concerns of performing an evaluation and management service by telephone and the availability of in person appointments. I also discussed with the patient that there may be a patient responsible charge related to this service. The patient expressed understanding and agreed to proceed.   History of Present Illness: 03/12/2018 [name] is here for regular f/u: CHF, Hypothyroidism, hypotension, diffuse chronic pain, and long term narcotic use. She reports medication compliance, denies SE She denies CP/dyspnea/dizziness/palpitations. She estimates to drink >60 oz water a day and tries " to eat as healthy as I can, however my appetite just doesn't seem as big as it once was". She has lost 5 lbs since 07/2017 She is not currently on PT or having home health She had L knee replacement surgery scheduled for March 2019, however cancelled the procedure "due to nerves". She is followed by Pain Management monthly She is followed by Psychiatry yearly  Instructed to f/u 6 months for chronic issues- did not She was seen 09/2018 for an acute issue 02/05/2019 Labs- TSH-WNL, 1.180, no change to current Levothyroxine dosage  A1c-5.7, barely pre-diabetic, please encourage her to reduce CHO/sugar intake and remain as active as tolerated (she has chronic pain and immobility issues).  CMP-stable, kidney fx improved  CBC-stable, she has hx of iron def anemia  Please tell her to continue her OTC Multivitamin (Centrum Silver) and I would recommend  Ferrous Sulfate 300(60 fe)mg/5mg /ml- 18ml BID- she can obtain that generic OTC.  Please encourage her to remain well hydrated and eat consume plenty of fiber in her diet.  Will re-check  CBC at her f/u in August.  Lipid Panel-overall greatly improved  Tot- 179  TGs- 89  HDL -83  LDL- 78  Not on statin  Again please encourage:  Ferrous Sulfate 300(60 fe)mg/5mg /ml- 22ml BID- she can obtain that generic OTC.   06/26/2019 OV: Pt's son "[name]" calls in today "just to touch base". [name] is listed on pt's DPR Pt's husband "[name]" suffered a fall at home >4 months ago- hospitalized now in [place]". "[name]" was the pt's primary care giver and now the son has had non-skilled care-givers coming to the house BID too- Administer medications [place] with bathing  Per son, the pt appears well nourished and well groomed. Per son, the pt denies recent falls. Per son, the pt denies acute cardiac sx's or increase in dyspnea. Per son, the pt denies increase in fatigue. Per son, the pt denies over sedation with current medication regime- She is followed by Dr. Nelva Bush for chronic pain PDMP Reviewed- Oxycodone 15mg -up to 5 tablets per day 150 tablets filled on 06/02/2019 Pregabalin 150mg  Q4H PRN, 270 tablets filled on 04/09/2019  [facility] OV 01/21/2019 Started on Ubrelvy 50 mg tablets, max 100 mg/day, not to take more than 8 days a month for chronic HA control. Instructed to f/u with Dr. Krista Blue 05/2019  Per son she has not been taking Ferrous Sulfate to correct anemia- if levels are still low at re-check- will discuss referral to Hemat/Onc for evaluation/tx  Diastolic CHF- she is not on any anti-hypertensives, BP will not tolerate. Believe low BP is combination of volume depletion and sedating medications. She  cannot recall last evaluation by cards- will address at [facility] in the next few weeks- however again he denies (per son) of any acute cardiac sx's.  She is followed by Psychiatrist/Dr. Toy Care for Anxiety,, Panic Attacks, Depression,  Per son and pt- she is unsure when she last saw this provider- advised to f/u.  Patient Care Team     Relationship Specialty Notifications Start End  Mina Marble D, NP PCP - General Family Medicine  10/23/16   Ileene Patrick, LCSW Social Worker Licensed Clinical Social Worker  08/11/13   Chucky May, [place] Physician Psychiatry  09/12/16   Donzetta Sprung., MD Physician Assistant Sports Medicine  09/12/16   Margaretmary Lombard, PhD Referring Physician Psychology  09/12/16   Juanita Craver, MD Consulting Physician Gastroenterology  12/12/16     Patient Active Problem List   Diagnosis Date Noted  . Encounter for Medicare annual wellness exam 12/25/2018  . Chronic back pain 09/05/2018  . 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 History of gastrointestinal hemorrhage 09/05/2018  . Insomnia 09/05/2018  . Injury of left foot 09/05/2018  . Chronic low back pain 05/07/2018  . Cognitive impairment 05/07/2018  . Age related osteoporosis 03/12/2018  . On long term drug therapy 09/07/2017  . Left-sided chest wall pain 07/25/2017  . Hypotension 01/22/2017  . Cerebrovascular accident (CVA) due to thrombosis of precerebral artery ([place]) 12/05/2016  . Neck pain 12/05/2016  . Gait abnormality 12/05/2016  . Paronychia of finger of right hand 08/22/2016  . Osteoarthritis of right knee 11/22/2015  . Right knee pain 10/14/2015  . Actinic keratoses 07/02/2015  . Injury of right hand 04/27/2014  . Chest wall tenderness 04/27/2014  . Foot drop, left 04/14/2014  . Traumatic injury of lower extremity 03/25/2014  . Obesity (BMI 30.0-34.9) 01/30/2014  . Healthcare maintenance 01/30/2014  . 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  . Diastolic CHF ([place]) 0000000  . Fibromyalgia 08/14/2012  . Numbness in feet 08/08/2012  . GERD with stricture 03/04/2012  . Leg edema 03/04/2012  . CATARACT, LEFT EYE 08/23/2010  . Abnormality of gait 08/23/2010  . Osteoarthritis 02/24/2010  . Iron deficiency anemia 03/06/2009  . 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 SYMPTOM, INCONTINENCE W/O  SENSORY AWARENESS 12/26/2006  . Memory loss 12/26/2006  . Hypothyroidism 11/01/2006  . DEPRESSION, MAJOR, RECURRENT 11/01/2006  . PANIC ATTACKS 11/01/2006  . Migraine headache 11/01/2006  . Carpal tunnel syndrome 11/01/2006  . 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 Irritable bowel syndrome 11/01/2006  . OSTEOPENIA 11/01/2006     Past Medical History:  Diagnosis Date  . Anemia    took Fe- orally 2 yrs. ago   . Anxiety    panic attacks   . Arthritis    shoulders, knees  . Cancer ([place])    skin- preCA  . Depression   . GERD (gastroesophageal reflux disease)    uses Protonix as needed   . Headache(784.0)   . Humerus fracture    Right   . Hyposmolality and/or hyponatremia 02/27/2013  . Hypothyroidism   . IBS (irritable bowel syndrome)   . Memory loss   . Mental disorder   . Migraine   . Preoperative examination 02/26/2013  . Seizures ([place])    caused by depression , seritonin seizure - effected short term memory    . Shortness of breath      Past Surgical History:  Procedure Laterality Date  . ABDOMINAL HYSTERECTOMY    . APPENDECTOMY    . BACK SURGERY  1990's  .  BREAST SURGERY  1972   augmentation   . CHOLECYSTECTOMY    . GASTRECTOMY  1994   Due to ulcer, required multiple revisions.   Marland Kitchen HERNIA REPAIR    . ORIF ANKLE FRACTURE Right 2004  . REPLACEMENT TOTAL KNEE    . REVERSE SHOULDER ARTHROPLASTY Right 05/16/2013   Procedure: RIGHT HEMI ARTHROPLASTY  VERSES  REVERSE TOTAL SHOULDER ARTHROPLASTY ;  Surgeon: Augustin Schooling, MD;  Location: [facility];  Service: Orthopedics;  Laterality: Right;  . SPINE SURGERY  2005   Lumbar spinal stenosis   . STOMACH SURGERY     1/3 resection for obstruction  . TONSILLECTOMY    . TUBAL LIGATION       Family History  Problem Relation Age of Onset  . Aneurysm Mother   . Heart disease Mother   . Cancer Father        pancreatic  . Stroke Father 51  . Hypertension Father   . Alcohol abuse Father   . Heart disease Maternal Grandfather       Social History   Substance and Sexual Activity  Drug Use No     Social History   Substance and Sexual Activity  Alcohol Use No   Comment: rare use of white wine      Social History   Tobacco Use  Smoking Status Never Smoker  Smokeless Tobacco Never Used     Outpatient Encounter Medications as of 06/26/2019  Medication Sig  . fluticasone (FLONASE) 50 MCG/ACT nasal spray Place 2 sprays into the nose daily. (Patient taking differently: Place 2 sprays daily as needed into both nostrils for allergies. )  . levothyroxine (SYNTHROID) 100 MCG tablet TAKE 1 TABLET BY MOUTH DAILY  . oxyCODONE (ROXICODONE) 15 MG immediate release tablet Take 1 tablet by mouth. May take up to 5 times daily  . pregabalin (LYRICA) 150 MG capsule Take 1 capsule (150 mg total) by mouth 3 (three) times daily.  . ramelteon (ROZEREM) 8 MG tablet Take 1 tablet by mouth daily.  Marland Kitchen tiZANidine (ZANAFLEX) 4 MG tablet TAKE 1/2 TO 1 TABLET BY MOUTH TWICE DAILY AS NEEDED  . topiramate (TOPAMAX) 100 MG tablet TAKE 1 TABLET BY MOUTH TWICE DAILY  . traZODone (DESYREL) 50 MG tablet Take 50-150 mg at bedtime as needed by mouth for sleep (and may take one additional 50 mg tablet daily AS NEEDED for part of a "headache cocktail").   . Ubrogepant (UBRELVY) 50 MG TABS Take 50 mg by mouth as needed (may repeat dose in 2 hours, not to exceed 100 mg/day, do not take more than 8 days a month).  . venlafaxine (EFFEXOR) 37.5 MG tablet Take 1 tablet (37.5 mg total) by mouth 2 (two) times daily.  Marland Kitchen vortioxetine HBr (TRINTELLIX) 20 MG TABS Take 20 mg by mouth daily.  . Coenzyme Q10 (CO Q-10) 300 MG CAPS Take 300 mg daily by mouth.   Eduard Roux (AIMOVIG) 140 MG/ML SOAJ Inject 140 mg into the skin every 30 (thirty) days. (Patient not taking: Reported on 06/26/2019)  . furosemide (LASIX) 20 MG tablet Take 1 tab daily only when needed for lower extremity swelling or increase in weight (Patient not taking: Reported on 06/26/2019)   . Multiple Vitamins-Minerals (CENTRUM SILVER PO) Take 1 tablet by mouth daily.   . ondansetron (ZOFRAN) 4 MG tablet TAKE 1 TABLET BY MOUTH EVERY 8 HOURS AS NEEDED FOR MIGRAINE PER DR [facility] (Patient not taking: Reported on 06/26/2019)  . S-Adenosylmethionine (MOOD PLUS SAM-E DOUBLE  ST) 400 MG TBEC Take 1 tablet by mouth daily.  . Turmeric (RA TURMERIC) 500 MG CAPS Take 500 mg by mouth daily.    No facility-administered encounter medications on file as of 06/26/2019.     Allergies: Bupropion hcl, Dairy aid [lactase], Azithromycin, and Erythromycin  There is no height or weight on file to calculate BMI.  Temperature (!) 97 F (36.1 C), temperature source Oral.  Patient Care Team    Relationship Specialty Notifications Start End  Vesta, Valetta Fuller D, NP PCP - General Family Medicine  10/23/16   Ileene Patrick, LCSW Social Worker Licensed Clinical Social Worker  08/11/13   Chucky May, [place] Physician Psychiatry  09/12/16   Donzetta Sprung., MD Physician Assistant Sports Medicine  09/12/16   Margaretmary Lombard, PhD Referring Physician Psychology  09/12/16   Juanita Craver, MD Consulting Physician Gastroenterology  12/12/16     Patient Active Problem List   Diagnosis Date Noted  . Encounter for Medicare annual wellness exam 12/25/2018  . Chronic back pain 09/05/2018  . 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 History of gastrointestinal hemorrhage 09/05/2018  . Insomnia 09/05/2018  . Injury of left foot 09/05/2018  . Chronic low back pain 05/07/2018  . Cognitive impairment 05/07/2018  . Age related osteoporosis 03/12/2018  . On long term drug therapy 09/07/2017  . Left-sided chest wall pain 07/25/2017  . Hypotension 01/22/2017  . Cerebrovascular accident (CVA) due to thrombosis of precerebral artery ([place]) 12/05/2016  . Neck pain 12/05/2016  . Gait abnormality 12/05/2016  . Paronychia of finger of right hand 08/22/2016  . Osteoarthritis of right knee 11/22/2015  . Right knee pain  10/14/2015  . Actinic keratoses 07/02/2015  . Injury of right hand 04/27/2014  . Chest wall tenderness 04/27/2014  . Foot drop, left 04/14/2014  . Traumatic injury of lower extremity 03/25/2014  . Obesity (BMI 30.0-34.9) 01/30/2014  . Healthcare maintenance 01/30/2014  . 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  . Diastolic CHF ([place]) 0000000  . Fibromyalgia 08/14/2012  . Numbness in feet 08/08/2012  . GERD with stricture 03/04/2012  . Leg edema 03/04/2012  . CATARACT, LEFT EYE 08/23/2010  . Abnormality of gait 08/23/2010  . Osteoarthritis 02/24/2010  . Iron deficiency anemia 03/06/2009  . 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 SYMPTOM, INCONTINENCE W/O SENSORY AWARENESS 12/26/2006  . Memory loss 12/26/2006  . Hypothyroidism 11/01/2006  . DEPRESSION, MAJOR, RECURRENT 11/01/2006  . PANIC ATTACKS 11/01/2006  . Migraine headache 11/01/2006  . Carpal tunnel syndrome 11/01/2006  . 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 Irritable bowel syndrome 11/01/2006  . OSTEOPENIA 11/01/2006     Past Medical History:  Diagnosis Date  . Anemia    took Fe- orally 2 yrs. ago   . Anxiety    panic attacks   . Arthritis    shoulders, knees  . Cancer ([place])    skin- preCA  . Depression   . GERD (gastroesophageal reflux disease)    uses Protonix as needed   . Headache(784.0)   . Humerus fracture    Right   . Hyposmolality and/or hyponatremia 02/27/2013  . Hypothyroidism   . IBS (irritable bowel syndrome)   . Memory loss   . Mental disorder   . Migraine   . Preoperative examination 02/26/2013  . Seizures ([place])    caused by depression , seritonin seizure - effected short term memory    . Shortness of breath      Past Surgical History:  Procedure Laterality Date  .  ABDOMINAL HYSTERECTOMY    . APPENDECTOMY    . BACK SURGERY  1990's  . BREAST SURGERY  1972   augmentation   . CHOLECYSTECTOMY    . GASTRECTOMY  1994   Due to ulcer, required multiple revisions.   Marland Kitchen HERNIA  REPAIR    . ORIF ANKLE FRACTURE Right 2004  . REPLACEMENT TOTAL KNEE    . REVERSE SHOULDER ARTHROPLASTY Right 05/16/2013   Procedure: RIGHT HEMI ARTHROPLASTY  VERSES  REVERSE TOTAL SHOULDER ARTHROPLASTY ;  Surgeon: Augustin Schooling, MD;  Location: [facility];  Service: Orthopedics;  Laterality: Right;  . SPINE SURGERY  2005   Lumbar spinal stenosis   . STOMACH SURGERY     1/3 resection for obstruction  . TONSILLECTOMY    . TUBAL LIGATION       Family History  Problem Relation Age of Onset  . Aneurysm Mother   . Heart disease Mother   . Cancer Father        pancreatic  . Stroke Father 66  . Hypertension Father   . Alcohol abuse Father   . Heart disease Maternal Grandfather      Social History   Substance and Sexual Activity  Drug Use No     Social History   Substance and Sexual Activity  Alcohol Use No   Comment: rare use of white wine      Social History   Tobacco Use  Smoking Status Never Smoker  Smokeless Tobacco Never Used     Outpatient Encounter Medications as of 06/26/2019  Medication Sig  . fluticasone (FLONASE) 50 MCG/ACT nasal spray Place 2 sprays into the nose daily. (Patient taking differently: Place 2 sprays daily as needed into both nostrils for allergies. )  . levothyroxine (SYNTHROID) 100 MCG tablet TAKE 1 TABLET BY MOUTH DAILY  . oxyCODONE (ROXICODONE) 15 MG immediate release tablet Take 1 tablet by mouth. May take up to 5 times daily  . pregabalin (LYRICA) 150 MG capsule Take 1 capsule (150 mg total) by mouth 3 (three) times daily.  . ramelteon (ROZEREM) 8 MG tablet Take 1 tablet by mouth daily.  Marland Kitchen tiZANidine (ZANAFLEX) 4 MG tablet TAKE 1/2 TO 1 TABLET BY MOUTH TWICE DAILY AS NEEDED  . topiramate (TOPAMAX) 100 MG tablet TAKE 1 TABLET BY MOUTH TWICE DAILY  . traZODone (DESYREL) 50 MG tablet Take 50-150 mg at bedtime as needed by mouth for sleep (and may take one additional 50 mg tablet daily AS NEEDED for part of a "headache cocktail").   .  Ubrogepant (UBRELVY) 50 MG TABS Take 50 mg by mouth as needed (may repeat dose in 2 hours, not to exceed 100 mg/day, do not take more than 8 days a month).  . venlafaxine (EFFEXOR) 37.5 MG tablet Take 1 tablet (37.5 mg total) by mouth 2 (two) times daily.  Marland Kitchen vortioxetine HBr (TRINTELLIX) 20 MG TABS Take 20 mg by mouth daily.  . Coenzyme Q10 (CO Q-10) 300 MG CAPS Take 300 mg daily by mouth.   Eduard Roux (AIMOVIG) 140 MG/ML SOAJ Inject 140 mg into the skin every 30 (thirty) days. (Patient not taking: Reported on 06/26/2019)  . furosemide (LASIX) 20 MG tablet Take 1 tab daily only when needed for lower extremity swelling or increase in weight (Patient not taking: Reported on 06/26/2019)  . Multiple Vitamins-Minerals (CENTRUM SILVER PO) Take 1 tablet by mouth daily.   . ondansetron (ZOFRAN) 4 MG tablet TAKE 1 TABLET BY MOUTH EVERY 8 HOURS AS NEEDED FOR  MIGRAINE PER DR [facility] (Patient not taking: Reported on 06/26/2019)  . S-Adenosylmethionine (MOOD PLUS SAM-E DOUBLE ST) 400 MG TBEC Take 1 tablet by mouth daily.  . Turmeric (RA TURMERIC) 500 MG CAPS Take 500 mg by mouth daily.    No facility-administered encounter medications on file as of 06/26/2019.     Allergies: Bupropion hcl, Dairy aid [lactase], Azithromycin, and Erythromycin  There is no height or weight on file to calculate BMI.  Temperature (!) 97 F (36.1 C), temperature source Oral. Review of Systems: General:   Denies fever, chills, unexplained weight loss.  Optho/Auditory:   Denies visual changes, blurred vision/LOV Respiratory:   Denies SOB, DOE more than baseline levels.  Cardiovascular:   Denies chest pain, palpitations, new onset peripheral edema  Gastrointestinal:   Denies nausea, vomiting, diarrhea.  Genitourinary: Denies dysuria, freq/ urgency, flank pain or discharge from genitals.  Endocrine:     Denies hot or cold intolerance, polyuria, polydipsia. Musculoskeletal:   Myalgias +, joint swelling+, arthralgias +, gait  problems +  Skin:  Denies rash, suspicious lesions Neurological:     Denies dizziness, unexplained weakness, numbness  Psychiatric/Behavioral:   Denies mood changes, suicidal or homicidal ideations, hallucinations This patient does not have sx concerning for COVID-19 Infection (ie; fever, chills, cough, new or worsening shortness of breath).   Observations/Objective: Spoke to pt's son "[name]" Pt communicated via son- denies any acute distress  Assessment and Plan: Recommend purchasing home BP machine and checking BP/HR daily- record, bring log to f/u to various f/u with providers. Increase water intake. Follow heart healthy diet. Provided contact information for Neurologist, Pain Mgt. Instructed to f/u with Psychiatrist-she had contact information. Will discuss cards f/u at [place] in the next few weeks. Continue to use walker and other assistive devices consistently to reduce risk of falls Home Health Orders Placed Consider referral to Hemat/Onc to address- will discuss with pt/son  Follow Up Instructions: Come into clinic for OV with labs ASAP in the next 1-2 weeks   I discussed the assessment and treatment plan with the patient. The patient was provided an opportunity to ask questions and all were answered. The patient agreed with the plan and demonstrated an understanding of the instructions.   The patient was advised to call back or seek an in-person evaluation if the symptoms worsen or if the condition fails to improve as anticipated.  I provided 35 minutes of non-face-to-face time during this encounter.   Esaw Grandchild, NP

## 2019-06-27 NOTE — Assessment & Plan Note (Signed)
Assessment and Plan: Recommend purchasing home BP machine and checking BP/HR daily- record, bring log to f/u to various f/u with providers. Increase water intake. Follow heart healthy diet. Provided contact information for Neurologist, Pain Mgt. Instructed to f/u with Psychiatrist-she had contact information. Will discuss cards f/u at [place] in the next few weeks. Continue to use walker and other assistive devices consistently to reduce risk of falls Home Health Orders Placed Consider referral to Hemat/Onc to address- will discuss with pt/son  Follow Up Instructions: Come into clinic for OV with labs ASAP in the next 1-2 weeks   I discussed the assessment and treatment plan with the patient. The patient was provided an opportunity to ask questions and all were answered. The patient agreed with the plan and demonstrated an understanding of the instructions.   The patient was advised to call back or seek an in-person evaluation if the symptoms worsen or if the condition fails to improve as anticipated.

## 2019-07-01 ENCOUNTER — Telehealth: Payer: Self-pay

## 2019-07-01 NOTE — Telephone Encounter (Signed)
Dorian Pod with [facility] called to inform us that she will be beginning services for pt on 07/03/2019.  Charyl Bigger, CMA

## 2019-07-04 ENCOUNTER — Telehealth: Payer: Self-pay | Admitting: Adult Health

## 2019-07-04 NOTE — Telephone Encounter (Signed)
Left message on confidential voicemail of [name] with verbal order for PT per Kenney Houseman this is a standing order. AS, CMA

## 2019-07-04 NOTE — Telephone Encounter (Signed)
Almira from [facility] is requesting a call back from clinic staff for verbal auth to start in home PT for patient. They are requesting twice a week for two weeks then once a week for another 2 weeks. She can be reached back at [phone]

## 2019-07-08 ENCOUNTER — Other Ambulatory Visit: Payer: Self-pay | Admitting: Neurology

## 2019-07-16 ENCOUNTER — Other Ambulatory Visit: Payer: Self-pay | Admitting: Neurology

## 2019-07-17 NOTE — Progress Notes (Signed)
Subjective:    Patient ID: [name], female    DOB: 01-17-1946, 73 y.o.   MRN: [id number]  HPI: 03/12/2018 [name] is here for regular f/u: CHF, Hypothyroidism,hypotension,diffuse chronic pain,andlong term narcotic use. She reports medication compliance, denies SE She denies CP/dyspnea/dizziness/palpitations. She estimates to drink >60 oz water a day and tries " to eat as healthy as I can, however my appetite just doesn't seem as big as it once was". She has lost 5 lbs since 07/2017 She is not currently on PT or having home health She had L knee replacement surgery scheduled for March 2019, however cancelled the procedure "due to nerves". She is followed by Pain Management monthly She is followed by Psychiatry yearly Instructed to f/u 6 months for chronic issues- did not She was seen 09/2018 for an acute issue 02/05/2019 Labs- TSH-WNL, 1.180, no change to current Levothyroxine dosage  A1c-5.7, barely pre-diabetic, please encourage her to reduce CHO/sugar intake and remain as active as tolerated (she has chronic pain and immobility issues).  CMP-stable, kidney fx improved  CBC-stable, she has hx of iron def anemia  Please tell her to continue her OTC Multivitamin (Centrum Silver) and I would recommend  Ferrous Sulfate 300(60 fe)mg/5mg /ml- 1ml BID- she can obtain that generic OTC.  Please encourage her to remain well hydrated and eat consume plenty of fiber in her diet.  Will re-check CBC at her f/u in August.  Lipid Panel-overall greatly improved  Tot- 179  TGs- 89  HDL -83  LDL- 78  Not on statin  Again please encourage:  Ferrous Sulfate 300(60 fe)mg/5mg /ml- 40ml BID- she can obtain that generic OTC.   06/26/2019 OV: Pt's son "[name]" calls in today "just to touch base". [name] is listed on pt's DPR Pt's husband "[name]" suffered a fall at home >4 months ago- hospitalized now in [place] "[name]" was the pt's primary [name] giver and now the  son has had non-skilled [name]-givers coming to the house BID too- Administer medications Clean [place]oke with bathing  Per son, the pt appears well nourished and well groomed. Per son, the pt denies recent falls. Per son, the pt denies acute cardiac sx's or increase in dyspnea. Per son, the pt denies increase in fatigue. Per son, the pt denies over sedation with current medication regime- She is followed by [name] for chronic pain PDMP Reviewed- Oxycodone 15mg -up to 5 tablets per day 150 tablets filled on 06/02/2019 Pregabalin 150mg  Q4H PRN, 270 tablets filled on 04/09/2019 [facility] OV 01/21/2019 Started on Ubrelvy 50 mg tablets, max 100 mg/day, not to take more than 8 days a month for chronic HA control. Instructed to f/u with [name] 05/2019  Per son she has not been taking Ferrous Sulfate to correct anemia- if levels are still low at re-check- will discuss referral to Hemat/Onc for evaluation/tx  Diastolic CHF- she is not on any anti-hypertensives, BP will not tolerate. Believe low BP is combination of volume depletion and sedating medications. She cannot recall last evaluation by cards- will address at [place] in the next few weeks- however again he denies (per son) of any acute cardiac sx's.  She is followed by Psychiatrist/[name] for Anxiety,, Panic Attacks, Depression,  Per son and pt- she is unsure when she last saw this provider- advised to f/u.  07/18/2019 OV:  [name] is here for f/u: Obesity, Hypotension, IBS, Hypothyroidism, Migraine HA, Depression, Iron Def Anemia- she has not been taking Ferrous Sulfate  due to GI- specifically diarrhea when she takes. Discussed taking with a full meal and also adding in an OTC Probiotic to help with chronic IBS. Home Health was recently ordered, in addition, Home PT. PT twice weekly visits- working on increasing strength and mobility. She reports 100% compliance with walker- denies any recent falls. She reports napping  when she watches TC or when sitting on porch. Discussed Sleep Hygiene and limiting napping to improve night time sleep. She reports decrease in lower ext edema- has not used PRN Furosemide in months. She has hx of Diastolic HF- has not been seen by cards in years. She denies CP/chest tightness with exertion. She denies shortness of breath or palpitations.  BP is low today- which is her typical SBP mid-upper 90s. She has not been checking BP/HR at home- advised to start and to call clinic in 6 weeks with readings. Encouraged to increase fluids. She has Pain Mgt appt 04 Aug 2019-[name] She has been trying to get in with her established Neurologist- encouraged her to be placed on "cancellation list" in hopes of being seen soon quicker. Encouraged to engage in some form of mental stimulation at least 15 mins/day. She asked opinion about getting a puppy to replace her dog that recently passed away- discouraged her adding a dog into home due to safety concerns. Husband "[name]" remains at [place]- no est d/c date for him Son "[name]" at bs during the [place]   Patient [name] Team    Relationship Specialty Notifications Start End  Esaw Grandchild, NP PCP - General Family Medicine  10/23/16   Ileene Patrick, LCSW Social Worker Licensed Clinical Social Worker  08/11/13   Chucky May, [place] Physician Psychiatry  09/12/16   Donzetta Sprung., MD Physician Assistant Sports Medicine  09/12/16   Margaretmary Lombard, PhD Referring Physician Psychology  09/12/16   Juanita Craver, MD Consulting Physician Gastroenterology  12/12/16     Patient Active Problem List   Diagnosis Date Noted  . Encounter for Medicare annual wellness exam 12/25/2018  . Chronic back pain 09/05/2018  . 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 History of gastrointestinal hemorrhage 09/05/2018  . Insomnia 09/05/2018  . Injury of left foot 09/05/2018  . Chronic low back pain 05/07/2018  . Cognitive impairment 05/07/2018   . Age related osteoporosis 03/12/2018  . On long term drug therapy 09/07/2017  . Left-sided chest wall pain 07/25/2017  . Hypotension 01/22/2017  . Cerebrovascular accident (CVA) due to thrombosis of precerebral artery ([place]) 12/05/2016  . Neck pain 12/05/2016  . Gait abnormality 12/05/2016  . Paronychia of finger of right hand 08/22/2016  . Osteoarthritis of right knee 11/22/2015  . Right knee pain 10/14/2015  . Actinic keratoses 07/02/2015  . Injury of right hand 04/27/2014  . Chest wall tenderness 04/27/2014  . Foot drop, left 04/14/2014  . Traumatic injury of lower extremity 03/25/2014  . Obesity (BMI 30.0-34.9) 01/30/2014  . Healthcare maintenance 01/30/2014  . Status [name] right shoulder hemiarthroplasty 01/30/2014  . Diastolic CHF ([place]) 0000000  . Fibromyalgia 08/14/2012  . Numbness in feet 08/08/2012  . GERD with stricture 03/04/2012  . Leg edema 03/04/2012  . CATARACT, LEFT EYE 08/23/2010  . Abnormality of gait 08/23/2010  . Osteoarthritis 02/24/2010  . Iron deficiency anemia 03/06/2009  . 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 SYMPTOM, INCONTINENCE W/O SENSORY AWARENESS 12/26/2006  . Memory loss 12/26/2006  . Hypothyroidism 11/01/2006  . DEPRESSION, MAJOR, RECURRENT 11/01/2006  . PANIC ATTACKS 11/01/2006  .  Migraine headache 11/01/2006  . Carpal tunnel syndrome 11/01/2006  . 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 Irritable bowel syndrome 11/01/2006  . OSTEOPENIA 11/01/2006     Past Medical History:  Diagnosis Date  . Anemia    took Fe- orally 2 yrs. ago   . Anxiety    panic attacks   . Arthritis    shoulders, knees  . Cancer ([place])    skin- preCA  . Depression   . GERD (gastroesophageal reflux disease)    uses Protonix as needed   . Headache(784.0)   . Humerus fracture    Right   . Hyposmolality and/or hyponatremia 02/27/2013  . Hypothyroidism   . IBS (irritable bowel syndrome)   . Memory loss   . Mental disorder   . Migraine   . Preoperative  examination 02/26/2013  . Seizures ([place])    caused by depression , seritonin seizure - effected short term memory    . Shortness of breath      Past Surgical History:  Procedure Laterality Date  . ABDOMINAL HYSTERECTOMY    . APPENDECTOMY    . BACK SURGERY  1990's  . BREAST SURGERY  1972   augmentation   . CHOLECYSTECTOMY    . GASTRECTOMY  1994   Due to ulcer, required multiple revisions.   Marland Kitchen HERNIA REPAIR    . ORIF ANKLE FRACTURE Right 2004  . REPLACEMENT TOTAL KNEE    . REVERSE SHOULDER ARTHROPLASTY Right 05/16/2013   Procedure: RIGHT HEMI ARTHROPLASTY  VERSES  REVERSE TOTAL SHOULDER ARTHROPLASTY ;  Surgeon: Augustin Schooling, MD;  Location: [facility];  Service: Orthopedics;  Laterality: Right;  . SPINE SURGERY  2005   Lumbar spinal stenosis   . STOMACH SURGERY     1/3 resection for obstruction  . TONSILLECTOMY    . TUBAL LIGATION       Family History  Problem Relation Age of Onset  . Aneurysm Mother   . Heart disease Mother   . Cancer Father        pancreatic  . Stroke Father 61  . Hypertension Father   . Alcohol abuse Father   . Heart disease Maternal Grandfather      Social History   Substance and Sexual Activity  Drug Use No     Social History   Substance and Sexual Activity  Alcohol Use No   Comment: rare use of white wine      Social History   Tobacco Use  Smoking Status Never Smoker  Smokeless Tobacco Never Used     Outpatient Encounter Medications as of 07/18/2019  Medication Sig  . levothyroxine (SYNTHROID) 100 MCG tablet TAKE 1 TABLET BY MOUTH DAILY  . Multiple Vitamins-Minerals (CENTRUM SILVER PO) Take 1 tablet by mouth daily.   Marland Kitchen oxyCODONE (ROXICODONE) 15 MG immediate release tablet Take 1 tablet by mouth. May take up to 5 times daily  . pregabalin (LYRICA) 150 MG capsule TAKE 1 CAPSULE BY MOUTH THREE TIMES A DAY  . ramelteon (ROZEREM) 8 MG tablet Take 1 tablet by mouth daily.  Marland Kitchen topiramate (TOPAMAX) 100 MG tablet TAKE 1 TABLET BY MOUTH  TWICE DAILY  . traZODone (DESYREL) 50 MG tablet Take 50-150 mg at bedtime as needed by mouth for sleep (and may take one additional 50 mg tablet daily AS NEEDED for part of a "headache cocktail").   . Ubrogepant (UBRELVY) 50 MG TABS Take 50 mg by mouth as needed (may repeat dose in 2 hours, not to exceed 100  mg/day, do not take more than 8 days a month).  . venlafaxine (EFFEXOR) 37.5 MG tablet TAKE 1 TABLET BY MOUTH TWICE DAILY  . vortioxetine HBr (TRINTELLIX) 20 MG TABS Take 20 mg by mouth daily.  . [DISCONTINUED] Coenzyme Q10 (CO Q-10) 300 MG CAPS Take 300 mg daily by mouth.   . [DISCONTINUED] Erenumab-aooe (AIMOVIG) 140 MG/ML SOAJ Inject 140 mg into the skin every 30 (thirty) days. (Patient not taking: Reported on 06/26/2019)  . [DISCONTINUED] fluticasone (FLONASE) 50 MCG/ACT nasal spray Place 2 sprays into the nose daily. (Patient not taking: Reported on 07/18/2019)  . [DISCONTINUED] furosemide (LASIX) 20 MG tablet Take 1 tab daily only when needed for lower extremity swelling or increase in weight (Patient not taking: Reported on 06/26/2019)  . [DISCONTINUED] ondansetron (ZOFRAN) 4 MG tablet TAKE 1 TABLET BY MOUTH EVERY 8 HOURS AS NEEDED FOR MIGRAINE PER DR [facility] (Patient not taking: Reported on 06/26/2019)  . [DISCONTINUED] S-Adenosylmethionine (MOOD PLUS SAM-E DOUBLE ST) 400 MG TBEC Take 1 tablet by mouth daily.  . [DISCONTINUED] tiZANidine (ZANAFLEX) 4 MG tablet TAKE 1/2 TO 1 TABLET BY MOUTH TWICE DAILY AS NEEDED (Patient not taking: Reported on 07/18/2019)  . [DISCONTINUED] Turmeric (RA TURMERIC) 500 MG CAPS Take 500 mg by mouth daily.    No facility-administered encounter medications on file as of 07/18/2019.     Allergies: Bupropion hcl, Dairy aid [lactase], Azithromycin, and Erythromycin  Body mass index is 33.97 kg/m.  Blood pressure 96/74, pulse 98, temperature 98.4 F (36.9 C), temperature source Oral, resp. rate 16, height 5\' 4"  (1.626 m), weight 197 lb 14.4 oz (89.8 kg), SpO2  93 %.  Review of Systems  Constitutional: Positive for fatigue. Negative for activity change, appetite change, chills, diaphoresis, fever and unexpected weight change.  HENT: Negative for congestion.   Eyes: Negative for visual disturbance.  Respiratory: Negative for cough, chest tightness, shortness of breath, wheezing and stridor.   Cardiovascular: Negative for chest pain, palpitations and leg swelling.  Gastrointestinal: Positive for diarrhea and nausea. Negative for abdominal distention, abdominal pain, constipation and vomiting.  Endocrine: Negative for polydipsia, polyphagia and polyuria.  Genitourinary: Positive for difficulty urinating, frequency and urgency.  Musculoskeletal: Positive for arthralgias, back pain, gait problem, joint swelling, myalgias, neck pain and neck stiffness.  Skin: Negative for color change, pallor, rash and wound.  Neurological: Positive for weakness and headaches. Negative for dizziness.  Hematological: Negative for adenopathy. Does not bruise/bleed easily.  Psychiatric/Behavioral: Positive for sleep disturbance. Negative for agitation, confusion, decreased concentration, dysphoric mood, hallucinations, self-injury and suicidal ideas. The patient is nervous/anxious. The patient is not hyperactive.        Objective:   Physical Exam Vitals signs and nursing note reviewed.  Constitutional:      Appearance: Normal appearance. She is obese.  HENT:     Head: Normocephalic and atraumatic.  Eyes:     Extraocular Movements: Extraocular movements intact.     Conjunctiva/sclera: Conjunctivae normal.     Pupils: Pupils are equal, round, and reactive to light.  Cardiovascular:     Rate and Rhythm: Normal rate and regular rhythm.     Pulses: Normal pulses.     Heart sounds: Normal heart sounds. No murmur. No friction rub. No gallop.   Musculoskeletal:     Right lower leg: Edema present.     Left lower leg: Edema present.  Skin:    Capillary Refill: Capillary  refill takes less than 2 seconds.     Coloration: Skin is pale.  Neurological:     Mental Status: She is alert and oriented to person, place, and time.  Psychiatric:        Attention and Perception: Attention and perception normal.        Mood and Affect: Mood and affect normal.        Speech: Speech normal.        Behavior: Behavior normal.        Thought Content: Thought content normal.        Cognition and Memory: Memory is impaired. She exhibits impaired recent memory and impaired remote memory.        Judgment: Judgment normal.       Assessment & Plan:   1. Need for influenza vaccination   2. Prediabetes   3. Healthcare maintenance   4. Gait abnormality   5. Foot drop, left   6. Hypotension, unspecified hypotension type   7. Iron deficiency anemia, unspecified iron deficiency anemia type   8. Insomnia, unspecified type   9. DEPENDENCE, OPIOID, CONTINUOUS     Healthcare maintenance Continue all medications as directed. Please take Ferrous Sulfate 300mg  (60 fe)5mg /26ml twice daily with a full meal. Recommend OTC Probiotic to help with IBS symptoms. Remain well hydrated with water, follow heart healthy diet. Continue with Home Health and Home PT. Continue consistent use of walker. Try to get up and walk once an hour during the daytime. Limit to daytime napping. Strive to engage in some form of mental activity for at least 30mins/day- Ie: Crossword puzzles, reading, word search, painting, drawing, cards, calling someone. Reduce daily TC watching. Follow-up in 2 month, will re-check complete blood count at that time. Continue follow-up with Neurologist and Pain Specialist as directed. Continue to social distance and wear a mask when in public.  Gait abnormality Ambulates with walker Home PT twice weekly currently  Foot drop, left Ambulates with walker Home PT twice weekly currently  Hypotension 96/74, HR 98 Increase daily fluids intake Check BP/HR daily- call  clinic in 6 weeks with readings or if ANY cardiac sx's develop She is on several sedating medications   Iron deficiency anemia Re-start Ferrous Sulfate- take with meals Re-check CBC in 2 months at f/u  Insomnia She reports napping when she watches TC or when sitting on porch. Discussed Sleep Hygiene and limiting napping to improve night time sleep.  DEPENDENCE, OPIOID, CONTINUOUS PDMP reviewed Followed by [name]- has f/u 08/04/2019    FOLLOW-UP:  Return in about 2 months (around 09/17/2019) for Regular Follow Up.

## 2019-07-18 ENCOUNTER — Encounter: Payer: Self-pay | Admitting: Adult Health

## 2019-07-18 ENCOUNTER — Other Ambulatory Visit: Payer: Self-pay

## 2019-07-18 ENCOUNTER — Ambulatory Visit (INDEPENDENT_AMBULATORY_CARE_PROVIDER_SITE_OTHER): Payer: Medicare Other | Admitting: Adult Health

## 2019-07-18 VITALS — BP 96/74 | HR 98 | Temp 98.4°F | Resp 16 | Ht 64.0 in | Wt 197.9 lb

## 2019-07-18 DIAGNOSIS — D509 Iron deficiency anemia, unspecified: Secondary | ICD-10-CM

## 2019-07-18 DIAGNOSIS — M21372 Foot drop, left foot: Secondary | ICD-10-CM | POA: Diagnosis not present

## 2019-07-18 DIAGNOSIS — F1120 Opioid dependence, uncomplicated: Secondary | ICD-10-CM

## 2019-07-18 DIAGNOSIS — R269 Unspecified abnormalities of gait and mobility: Secondary | ICD-10-CM | POA: Diagnosis not present

## 2019-07-18 DIAGNOSIS — I959 Hypotension, unspecified: Secondary | ICD-10-CM

## 2019-07-18 DIAGNOSIS — Z0000 Encounter for general adult medical examination without abnormal findings: Secondary | ICD-10-CM

## 2019-07-18 DIAGNOSIS — G4700 Insomnia, unspecified: Secondary | ICD-10-CM

## 2019-07-18 DIAGNOSIS — Z23 Encounter for immunization: Secondary | ICD-10-CM | POA: Diagnosis not present

## 2019-07-18 DIAGNOSIS — R7303 Prediabetes: Secondary | ICD-10-CM | POA: Diagnosis not present

## 2019-07-18 LAB — POCT GLYCOSYLATED HEMOGLOBIN (HGB A1C): Hemoglobin A1C: 5.6 % (ref 4.0–5.6)

## 2019-07-18 NOTE — Assessment & Plan Note (Signed)
Ambulates with walker Home PT twice weekly currently

## 2019-07-18 NOTE — Assessment & Plan Note (Signed)
PDMP reviewed Followed by Dr. Nelva Bush- has f/u 08/04/2019

## 2019-07-18 NOTE — Assessment & Plan Note (Signed)
She reports napping when she watches TC or when sitting on porch. Discussed Sleep Hygiene and limiting napping to improve night time sleep.

## 2019-07-18 NOTE — Assessment & Plan Note (Signed)
Continue all medications as directed. Please take Ferrous Sulfate 300mg  (60 fe)5mg /79ml twice daily with a full meal. Recommend OTC Probiotic to help with IBS symptoms. Remain well hydrated with water, follow heart healthy diet. Continue with Home Health and Home PT. Continue consistent use of walker. Try to get up and walk once an hour during the daytime. Limit to daytime napping. Strive to engage in some form of mental activity for at least 69mins/day- Ie: Crossword puzzles, reading, word search, painting, drawing, cards, calling someone. Reduce daily TC watching. Follow-up in 2 month, will re-check complete blood count at that time. Continue follow-up with Neurologist and Pain Specialist as directed. Continue to social distance and wear a mask when in public.

## 2019-07-18 NOTE — Patient Instructions (Addendum)
Iron Deficiency Anemia, Adult Iron-deficiency anemia is when you have a low amount of red blood cells or hemoglobin. This happens because you have too little iron in your body. Hemoglobin carries oxygen to parts of the body. Anemia can cause your body to not get enough oxygen. It may or may not cause symptoms. Follow these instructions at home: Medicines  Take over-the-counter and prescription medicines only as told by your doctor. This includes iron pills (supplements) and vitamins.  If you cannot handle taking iron pills by mouth, ask your doctor about getting iron through: ? A vein (intravenously). ? A shot (injection) into a muscle.  Take iron pills when your stomach is empty. If you cannot handle this, take them with food.  Do not drink milk or take antacids at the same time as your iron pills.  To prevent trouble pooping (constipation), eat fiber or take medicine (stool softener) as told by your doctor. Eating and drinking   Talk with your doctor before changing the foods you eat. He or she may tell you to eat foods that have a lot of iron, such as: ? Liver. ? Lowfat (lean) beef. ? Breads and cereals that have iron added to them (fortified breads and cereals). ? Eggs. ? Dried fruit. ? Dark green, leafy vegetables.  Drink enough fluid to keep your pee (urine) clear or pale yellow.  Eat fresh fruits and vegetables that are high in vitamin C. They help your body to use iron. Foods with a lot of vitamin C include: ? Oranges. ? Peppers. ? Tomatoes. ? Mangoes. General instructions  Return to your normal activities as told by your doctor. Ask your doctor what activities are safe for you.  Keep yourself clean, and keep things clean around you (your surroundings). Anemia can make you get sick more easily.  Keep all follow-up visits as told by your doctor. This is important. Contact a doctor if:  You feel sick to your stomach (nauseous).  You throw up (vomit).  You feel  weak.  You are sweating for no clear reason.  You have trouble pooping, such as: ? Pooping (having a bowel movement) less than 3 times a week. ? Straining to poop. ? Having poop that is hard, dry, or larger than normal. ? Feeling full or bloated. ? Pain in the lower belly. ? Not feeling better after pooping. Get help right away if:  You pass out (faint). If this happens, do not drive yourself to the hospital. Call your local emergency services (911 in the [place]  You have chest pain.  You have shortness of breath that: ? Is very bad. ? Gets worse with physical activity.  You have a fast heartbeat.  You get light-headed when getting up from sitting or lying down. This information is not intended to replace advice given to you by your health care provider. Make sure you discuss any questions you have with your health care provider. Document Released: 09/23/2010 Document Revised: 08/03/2017 Document Reviewed: 05/10/2016 Elsevier Patient Education  2020 [place] all medications as directed. Please take Ferrous Sulfate 300mg  (60 fe)5mg /47ml twice daily with a full meal. Recommend OTC Probiotic to help with IBS symptoms. Remain well hydrated with water, follow heart healthy diet. Continue with Home Health and Home PT. Continue consistent use of walker. Try to get up and walk once an hour during the daytime. Limit to daytime napping. Strive to engage in some form of mental activity for at least 13mins/day- Ie: Crossword puzzles,  reading, word search, painting, drawing, cards, calling someone. Reduce daily TC watching. Follow-up in 2 month, will re-check complete blood count at that time. Continue follow-up with Neurologist and Pain Specialist as directed. Continue to social distance and wear a mask when in public. GREAT TO SEE YOU!

## 2019-07-18 NOTE — Assessment & Plan Note (Signed)
96/74, HR 98 Increase daily fluids intake Check BP/HR daily- call clinic in 6 weeks with readings or if ANY cardiac sx's develop She is on several sedating medications

## 2019-07-18 NOTE — Assessment & Plan Note (Signed)
Re-start Ferrous Sulfate- take with meals Re-check CBC in 2 months at f/u

## 2019-07-21 ENCOUNTER — Telehealth: Payer: Self-pay | Admitting: Adult Health

## 2019-07-21 NOTE — Telephone Encounter (Signed)
Daleen Snook @ [facility] request OV notes from 07/18/19 be faxed to [phone]--  Faxed as requested.  --glh

## 2019-08-04 DIAGNOSIS — Z5181 Encounter for therapeutic drug level monitoring: Secondary | ICD-10-CM | POA: Diagnosis not present

## 2019-08-04 DIAGNOSIS — M5136 Other intervertebral disc degeneration, lumbar region: Secondary | ICD-10-CM | POA: Diagnosis not present

## 2019-08-04 DIAGNOSIS — M545 Low back pain: Secondary | ICD-10-CM | POA: Diagnosis not present

## 2019-08-04 DIAGNOSIS — Z79899 Other long term (current) drug therapy: Secondary | ICD-10-CM | POA: Diagnosis not present

## 2019-08-04 DIAGNOSIS — G894 Chronic pain syndrome: Secondary | ICD-10-CM | POA: Diagnosis not present

## 2019-08-06 ENCOUNTER — Other Ambulatory Visit: Payer: Self-pay | Admitting: Adult Health

## 2019-09-03 ENCOUNTER — Telehealth: Payer: Self-pay | Admitting: Adult Health

## 2019-09-03 NOTE — Telephone Encounter (Signed)
[facility] rep Lexine Baton called requested [name]'s signature on forms for Patient's home svcs.    ---Faxing over to provider to [place] for [facility] to sign & return, so they can get their billing & be reimbursed for Oct 2020- Dec 2020 services.  --Forms placed in provider's basket for review & signing.  --glh

## 2019-09-03 NOTE — Telephone Encounter (Signed)
Noted.  T. Chayse Gracey, CMA 

## 2019-09-12 ENCOUNTER — Other Ambulatory Visit: Payer: Self-pay | Admitting: Neurology

## 2019-09-16 ENCOUNTER — Other Ambulatory Visit: Payer: Self-pay

## 2019-09-16 ENCOUNTER — Telehealth: Payer: Self-pay | Admitting: Adult Health

## 2019-09-16 ENCOUNTER — Encounter: Payer: Self-pay | Admitting: Adult Health

## 2019-09-16 ENCOUNTER — Ambulatory Visit (INDEPENDENT_AMBULATORY_CARE_PROVIDER_SITE_OTHER): Payer: Medicare Other | Admitting: Adult Health

## 2019-09-16 ENCOUNTER — Ambulatory Visit: Payer: Medicare Other | Admitting: Adult Health

## 2019-09-16 DIAGNOSIS — Z0000 Encounter for general adult medical examination without abnormal findings: Secondary | ICD-10-CM | POA: Diagnosis not present

## 2019-09-16 NOTE — Telephone Encounter (Signed)
Patient's son/ [name] called request to speak w/ [name] (advised she is in afternoon clinic w/patient) & I would forward the message that he had Questions/ concerns he wanted to speak to her about.   Message to med asst that when provider has a minute, can she call him ( I also suggested he use the Mychart app to send a direct message to provider, if he has been granted access to Pt's Mychart(gave him information on how to get access)  --[name] still ask for provider to call him back at  [phone].  --glh

## 2019-09-16 NOTE — Telephone Encounter (Signed)
Please see message.  [name], CMA

## 2019-09-16 NOTE — Assessment & Plan Note (Signed)
Assessment and Plan: Continue all medications as directed. Recommended referral to Hemat/Onc for Iron transfusion since she cannot tolerate oral ferrous sulfate-she declined. Remain well hydrated, follow heart healthy diet. Continue use of walker, call clinic with name of previous home PT so that we can order another round of therapy with same therapist. Recommend daily stimulating activities- ie puzzles, word finds, etc. Keep Neurology appt in Feb. Continue to social distance and wear a mask when in public. Discussed at length the current guidelines with COVID-19 vaccines. Per pt she has never had anaphylactic reactions to any medications/insects/foods or known allergy to polyethylene glycol.   Follow Up Instructions: 3 month OV   I discussed the assessment and treatment plan with the patient. The patient was provided an opportunity to ask questions and all were answered. The patient agreed with the plan and demonstrated an understanding of the instructions.   The patient was advised to call back or seek an in-person evaluation if the symptoms worsen or if the condition fails to improve as anticipated.  I provided 35 minutes of non-face-to-face time during this encounter.

## 2019-09-16 NOTE — Progress Notes (Signed)
Virtual Visit via Telephone Note  I connected with [name] on 09/16/19 at 10:00 AM EST by telephone and verified that I am speaking with the correct person using two identifiers.  Location: Patient: Home Provider: In Clinic   I discussed the limitations, risks, security and privacy concerns of performing an evaluation and management service by telephone and the availability of in person appointments. I also discussed with the patient that there may be a patient responsible charge related to this service. The patient expressed understanding and agreed to proceed.   History of Present Illness: 03/12/2018 [name] is here for regular f/u: CHF, Hypothyroidism,hypotension,diffuse chronic pain,andlong term narcotic use. She reports medication compliance, denies SE She denies CP/dyspnea/dizziness/palpitations. She estimates to drink >60 oz water a day and tries " to eat as healthy as I can, however my appetite just doesn't seem as big as it once was". She has lost 5 lbs since 07/2017 She is not currently on PT or having home health She had L knee replacement surgery scheduled for March 2019, however cancelled the procedure "due to nerves". She is followed by Pain Management monthly She is followed by Psychiatry yearly Instructed to f/u 6 months for chronic issues- did not She was seen 09/2018 for an acute issue 02/05/2019 Labs- TSH-WNL, 1.180, no change to current Levothyroxine dosage  A1c-5.7, barely pre-diabetic, please encourage her to reduce CHO/sugar intake and remain as active as tolerated (she has chronic pain and immobility issues).  CMP-stable, kidney fx improved  CBC-stable, she has hx of iron def anemia  Please tell her to continue her OTC Multivitamin (Centrum Silver) and I would recommend  Ferrous Sulfate 300(60 fe)mg/5mg /ml- 42ml BID- she can obtain that generic OTC.  Please encourage her to remain well hydrated and eat consume plenty of fiber in her diet.  Will re-check  CBC at her f/u in August.  Lipid Panel-overall greatly improved  Tot- 179  TGs- 89  HDL -83  LDL- 78  Not on statin  Again please encourage:  Ferrous Sulfate 300(60 fe)mg/5mg /ml- 66ml BID- she can obtain that generic OTC.   06/26/2019 OV: Pt's son "[name]"calls in today "just to touch base". [name] is listed onpt's DPR Pt's husband "[name]" suffered a fall at home >4 months ago- hospitalized now in [place] [facility] "[name]" was the pt's primary [name] giver and now the son has had non-skilled [name]-givers coming to the house BID too- Administer medications [place] with bathing  Per son, the pt appears well nourished and well groomed. Per son, the pt denies recent falls. Per son, the pt denies acute cardiac sx's or increase in dyspnea. Per son, the pt denies increase in fatigue. Per son, the pt denies over sedation with current medication regime- She is followed by [name] for chronic pain PDMP Reviewed- Oxycodone 15mg -up to 5 tablets per day 150 tablets filled on 06/02/2019 Pregabalin 150mg  Q4H PRN, 270 tablets filled on 04/09/2019 [facility] OV 01/21/2019 Started onUbrelvy 50 mg tablets, max 100 mg/day, not to take more than 8 days a monthfor chronic HA control. Instructed to f/u with [name] 05/2019  Per son she has not been taking Ferrous Sulfate to correct anemia- if levels are still low at re-check- will discuss referral to Hemat/Onc for evaluation/tx  Diastolic CHF- she is not on any anti-hypertensives, BP will not tolerate. Believe low BP is combination of volume depletion and sedating medications. She cannot recall last evaluation by cards- will address at [facility] in  the next few weeks- however again he denies (per son) of any acute cardiac sx's.  She is followed by Psychiatrist/[name] for Anxiety,, Panic Attacks, Depression,  Per son and pt- she is unsure when she last saw this provider- advised to f/u.  07/18/2019 OV:  Ms.  [name] is here for f/u: Obesity, Hypotension, IBS, Hypothyroidism, Migraine HA, Depression, Iron Def Anemia- she has not been taking Ferrous Sulfate due to GI- specifically diarrhea when she takes. Discussed taking with a full meal and also adding in an OTC Probiotic to help with chronic IBS. Home Health was recently ordered, in addition, Home PT. PT twice weekly visits- working on increasing strength and mobility. She reports 100% compliance with walker- denies any recent falls. She reports napping when she watches TC or when sitting on porch. Discussed Sleep Hygiene and limiting napping to improve night time sleep. She reports decrease in lower ext edema- has not used PRN Furosemide in months. She has hx of Diastolic HF- has not been seen by cards in years. She denies CP/chest tightness with exertion. She denies shortness of breath or palpitations.  BP is low today- which is her typical SBP mid-upper 90s. She has not been checking BP/HR at home- advised to start and to call clinic in 6 weeks with readings. Encouraged to increase fluids. She has Pain Mgt appt 04 Aug 2019-[name] She has been trying to get in with her established Neurologist- encouraged her to be placed on "cancellation list" in hopes of being seen soon quicker. Encouraged to engage in some form of mental stimulation at least 15 mins/day. She asked opinion about getting a puppy to replace her dog that recently passed away- discouraged her adding a dog into home due to safety concerns. Husband "[name]" remains at [facility]- no est d/c date for him Son "[name]" at bs during the [place]  09/16/2019 OV: [name] calls for 78month f/u: She estimates to 10-12 x 10 oz glasses of water per day. She continues to abstain from tobacco/vape/ETOH use.   Her husband is now in assisted living facility- will be transferred back to hospital for knee surgery this week.  She has put out some games/puzzles, however has yet use them. She  has appt with Neurology 10/27/2019- to address HA. She denies change in vision, has not had dilated eye examination >3 years.  Discussed at length the current guidelines with COVID-19 vaccines. Per pt she has never had anaphylactic reactions to any medications/insects/foods or known allergy to polyethylene glycol.  She was unable to tolerate oral ferrous sulfate, offered referral to hemat/onc for iron transfusions- she declined today.  Her son "[name]" was on call, requests that she have another round of home PT and would like previous home PT. Asked family to find name or previous therapist and call us with information-we will request.  Ms. Bentham denies acute cardiac sx's, she is not checking her BP/HR at home- she has hx of hypotension. She denies any recent falls at home.  Patient [name] Team    Relationship Specialty Notifications Start End  Mina Marble D, NP PCP - General Family Medicine  10/23/16   Ileene Patrick, LCSW Social Worker Licensed Clinical Social Worker  08/11/13   Chucky May, [place] Physician Psychiatry  09/12/16   Donzetta Sprung., MD Physician Assistant Sports Medicine  09/12/16   Margaretmary Lombard, PhD Referring Physician Psychology  09/12/16   Juanita Craver, MD Consulting Physician Gastroenterology  12/12/16     Patient  Active Problem List   Diagnosis Date Noted  . Encounter for Medicare annual wellness exam 12/25/2018  . Chronic back pain 09/05/2018  . 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 History of gastrointestinal hemorrhage 09/05/2018  . Insomnia 09/05/2018  . Injury of left foot 09/05/2018  . Chronic low back pain 05/07/2018  . Cognitive impairment 05/07/2018  . Age related osteoporosis 03/12/2018  . On long term drug therapy 09/07/2017  . Left-sided chest wall pain 07/25/2017  . Hypotension 01/22/2017  . Cerebrovascular accident (CVA) due to thrombosis of precerebral artery ([place]) 12/05/2016  . Neck pain 12/05/2016  . Gait abnormality 12/05/2016   . Paronychia of finger of right hand 08/22/2016  . Osteoarthritis of right knee 11/22/2015  . Right knee pain 10/14/2015  . Actinic keratoses 07/02/2015  . Injury of right hand 04/27/2014  . Chest wall tenderness 04/27/2014  . Foot drop, left 04/14/2014  . Traumatic injury of lower extremity 03/25/2014  . Obesity (BMI 30.0-34.9) 01/30/2014  . Healthcare maintenance 01/30/2014  . Status [name] right shoulder hemiarthroplasty 01/30/2014  . Diastolic CHF ([place]) 0000000  . Fibromyalgia 08/14/2012  . Numbness in feet 08/08/2012  . GERD with stricture 03/04/2012  . Leg edema 03/04/2012  . CATARACT, LEFT EYE 08/23/2010  . Abnormality of gait 08/23/2010  . Osteoarthritis 02/24/2010  . Iron deficiency anemia 03/06/2009  . 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 SYMPTOM, INCONTINENCE W/O SENSORY AWARENESS 12/26/2006  . Memory loss 12/26/2006  . Hypothyroidism 11/01/2006  . DEPRESSION, MAJOR, RECURRENT 11/01/2006  . PANIC ATTACKS 11/01/2006  . Migraine headache 11/01/2006  . Carpal tunnel syndrome 11/01/2006  . 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 Irritable bowel syndrome 11/01/2006  . OSTEOPENIA 11/01/2006     Past Medical History:  Diagnosis Date  . Anemia    took Fe- orally 2 yrs. ago   . Anxiety    panic attacks   . Arthritis    shoulders, knees  . Cancer ([place])    skin- preCA  . Depression   . GERD (gastroesophageal reflux disease)    uses Protonix as needed   . Headache(784.0)   . Humerus fracture    Right   . Hyposmolality and/or hyponatremia 02/27/2013  . Hypothyroidism   . IBS (irritable bowel syndrome)   . Memory loss   . Mental disorder   . Migraine   . Preoperative examination 02/26/2013  . Seizures ([place])    caused by depression , seritonin seizure - effected short term memory    . Shortness of breath      Past Surgical History:  Procedure Laterality Date  . ABDOMINAL HYSTERECTOMY    . APPENDECTOMY    . BACK SURGERY  1990's  . BREAST SURGERY  1972    augmentation   . CHOLECYSTECTOMY    . GASTRECTOMY  1994   Due to ulcer, required multiple revisions.   Marland Kitchen HERNIA REPAIR    . ORIF ANKLE FRACTURE Right 2004  . REPLACEMENT TOTAL KNEE    . REVERSE SHOULDER ARTHROPLASTY Right 05/16/2013   Procedure: RIGHT HEMI ARTHROPLASTY  VERSES  REVERSE TOTAL SHOULDER ARTHROPLASTY ;  Surgeon: Augustin Schooling, MD;  Location: [facility];  Service: Orthopedics;  Laterality: Right;  . SPINE SURGERY  2005   Lumbar spinal stenosis   . STOMACH SURGERY     1/3 resection for obstruction  . TONSILLECTOMY    . TUBAL LIGATION       Family History  Problem Relation Age of Onset  . Aneurysm Mother   .  Heart disease Mother   . Cancer Father        pancreatic  . Stroke Father 68  . Hypertension Father   . Alcohol abuse Father   . Heart disease Maternal Grandfather      Social History   Substance and Sexual Activity  Drug Use No     Social History   Substance and Sexual Activity  Alcohol Use No   Comment: rare use of white wine      Social History   Tobacco Use  Smoking Status Never Smoker  Smokeless Tobacco Never Used     Outpatient Encounter Medications as of 09/16/2019  Medication Sig  . lactobacillus acidophilus (BACID) TABS tablet Take 1 tablet by mouth every other day.  . levothyroxine (SYNTHROID) 100 MCG tablet TAKE 1 TABLET BY MOUTH DAILY  . oxyCODONE (ROXICODONE) 15 MG immediate release tablet Take 1 tablet by mouth. May take up to 5 times daily  . pregabalin (LYRICA) 150 MG capsule TAKE 1 CAPSULE BY MOUTH THREE TIMES A DAY  . ramelteon (ROZEREM) 8 MG tablet Take 1 tablet by mouth daily.  Marland Kitchen topiramate (TOPAMAX) 100 MG tablet TAKE 1 TABLET BY MOUTH TWICE DAILY  . traZODone (DESYREL) 50 MG tablet Take 50-150 mg at bedtime as needed by mouth for sleep (and may take one additional 50 mg tablet daily AS NEEDED for part of a "headache cocktail").   . Ubrogepant (UBRELVY) 50 MG TABS Take 50 mg by mouth as needed (may repeat dose in 2 hours,  not to exceed 100 mg/day, do not take more than 8 days a month).  . venlafaxine (EFFEXOR) 37.5 MG tablet TAKE 1 TABLET BY MOUTH TWICE DAILY  . vortioxetine HBr (TRINTELLIX) 20 MG TABS Take 20 mg by mouth daily.  . [DISCONTINUED] Multiple Vitamins-Minerals (CENTRUM SILVER PO) Take 1 tablet by mouth daily.    No facility-administered encounter medications on file as of 09/16/2019.    Allergies: Bupropion hcl, Dairy aid [lactase], Azithromycin, and Erythromycin  There is no height or weight on file to calculate BMI.  Temperature (!) 97.4 F (36.3 C), temperature source Temporal. Review of Systems: General:   Denies fever, chills, unexplained weight loss.  Optho/Auditory:   Denies visual changes, blurred vision/LOV Respiratory:   Denies SOB, DOE more than baseline levels.  Cardiovascular:   Denies chest pain, palpitations, new onset peripheral edema  Gastrointestinal:   Denies nausea, vomiting, diarrhea.  Genitourinary: Denies dysuria, freq/ urgency, flank pain or discharge from genitals.  Endocrine:     Denies hot or cold intolerance, polyuria, polydipsia. Musculoskeletal:   Denies unexplained myalgias, joint swelling, unexplained arthralgias, gait problems.  Skin:  Denies rash, suspicious lesions Neurological:     Denies dizziness, unexplained weakness, numbness  Psychiatric/Behavioral:   Denies mood changes, suicidal or homicidal ideations, hallucinations    Observations/Objective: No acute distress noted during telephone conversation today.  Assessment and Plan: Continue all medications as directed. Recommended referral to Hemat/Onc for Iron transfusion since she cannot tolerate oral ferrous sulfate-she declined. Remain well hydrated, follow heart healthy diet. Continue use of walker, call clinic with name of previous home PT so that we can order another round of therapy with same therapist. Recommend daily stimulating activities- ie puzzles, word finds, etc. Keep Neurology appt  in Feb. Continue to social distance and wear a mask when in public. Discussed at length the current guidelines with COVID-19 vaccines. Per pt she has never had anaphylactic reactions to any medications/insects/foods or known allergy to polyethylene glycol.  Follow Up Instructions: 3 month OV   I discussed the assessment and treatment plan with the patient. The patient was provided an opportunity to ask questions and all were answered. The patient agreed with the plan and demonstrated an understanding of the instructions.   The patient was advised to call back or seek an in-person evaluation if the symptoms worsen or if the condition fails to improve as anticipated.  I provided 35 minutes of non-face-to-face time during this encounter.   Esaw Grandchild, NP

## 2019-10-14 ENCOUNTER — Other Ambulatory Visit: Payer: Self-pay | Admitting: Neurology

## 2019-10-27 ENCOUNTER — Other Ambulatory Visit: Payer: Self-pay

## 2019-10-27 ENCOUNTER — Other Ambulatory Visit: Payer: Self-pay | Admitting: Adult Health

## 2019-10-27 ENCOUNTER — Encounter: Payer: Self-pay | Admitting: Neurology

## 2019-10-27 ENCOUNTER — Ambulatory Visit (INDEPENDENT_AMBULATORY_CARE_PROVIDER_SITE_OTHER): Payer: Medicare Other | Admitting: Neurology

## 2019-10-27 VITALS — BP 92/60 | HR 88 | Temp 97.8°F | Ht 64.0 in | Wt 203.0 lb

## 2019-10-27 DIAGNOSIS — R269 Unspecified abnormalities of gait and mobility: Secondary | ICD-10-CM

## 2019-10-27 DIAGNOSIS — R413 Other amnesia: Secondary | ICD-10-CM | POA: Diagnosis not present

## 2019-10-27 DIAGNOSIS — G43911 Migraine, unspecified, intractable, with status migrainosus: Secondary | ICD-10-CM | POA: Diagnosis not present

## 2019-10-27 MED ORDER — TOPIRAMATE 100 MG PO TABS: 100.0000 mg | ORAL_TABLET | Freq: Two times a day (BID) | ORAL | 4 refills | Status: DC

## 2019-10-27 NOTE — Progress Notes (Signed)
PATIENT: [name] DOB: Mar 04, 1946  Chief Complaint  Patient presents with  . Migraine    She is here with her son, [name]. She seldom has migraines and Roselyn Meier works well when she does have one.      HISTORICAL:  [name] is a 74 years old right-handed female, accompanied by her husband [name],  seen in refer by her primary care physician nurse practitioner [name] for evaluation of migraine, memory loss, initial evaluation was on November 06 2016.  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  She reported lifelong history of migraine headaches, her typical migraine are right side severe pounding headache with associated light noise sensitivity, nauseous, lasting for 3 days, she was previously treated by neurologist at [facility] with Lyrica 400 mg daily, tizanidine Frova as needed, oxycodone 20 mg 4 times a day,  Her physician changed at the beginning of the year, she was tapered off oxycodone, since then, she complained of frequent headaches, about once a week severe right side migraine headache, lasting for 3 days, has to go to emergency room,  She has chronic gait abnormality due to right knee pain, she had right knee replacement, left knee severe arthritis disease.B12 level was 1300,  Update December 05 2016: Reviewed laboratory evaluation  on November 06 2016,no significant abnormality, CMP, C-reactive protein, folic acid, negative RPR, We personally reviewed MRI of the brain without contrast on November 19 2016: Moderate cortical atrophy, no significant change compared to previous scan in 2011, chronic bilateral parietal lobe encephalomalacia, chronic hemo-product on the left side, chronic microvascular ischemic changes in the cerebral hemisphere, and in the left cerebellar hemisphere,  She is taking Diclofenac prn for her headaches. She complains almost daily bilateral retro-orbital area pressure pain, lasting for a few hours,  She also complains of increased, gradual worsening gait abnormality, urinary incontinence.  UPDATE Jun 11 2017: She complains of constant headache,  lateralized severe pounding headache, lasting for 2 days, she is now taking frequent almost daily diclofenac without much help, she is also taking Topamax 100 mg every day at nighttime as preventive medication, which did not make much difference,  She has tried BOTOX Injection in the past, without help, she is not a candidate for triptan treatment because evidence of previous stroke, multiple vascular risk factors  MRI of cervical in April 2018 showed evidence of multilevel degenerative changes, most severe at C4-5, C5-6, with evidence of disc bulging, severe bilateral foraminal narrowing  UPDATE Sept 3 2019: Her insurance denies Amovig, she is now taking effexor 37.5mg  bid, which did help her migraine some, she reported mild improvement, she is now having migraine 2 times a week, sleeps helps her migraine, she is now taking oycodone 15mg  up to five times a day, diclofenac as needed.  She still has trouble with her memory. MMSE is 21/30 today.  UPDATE  Oct 27 2019: She is accompanied by her [name] at today's visit, her migraine headache is overall doing very well, only has couple migraines each month, Roselyn Meier works well for her migraine,  She was noted to  have gradual worsening memory loss, especially since her husband recent stay at nursing home, she has low back, knee hip pain, on polypharmacy treatment, including oxycodone 15 mg 4 times a day, Lyrica 150 mg 3 times a day, trazodone 50 mg at bedtime, Effexor, Trintellix, and Topamax 100 mg twice a day, she had a history of seizure, has not had recurrent seizure for years  REVIEW OF SYSTEMS: Full 14 system review of systems performed and notable only for as above All other review of systems were negative.  ALLERGIES: Allergies  Allergen Reactions  . Bupropion Hcl Other (See Comments)    SEIZURES!!  . Dairy Aid [Lactase] Diarrhea  . Azithromycin Rash  . Erythromycin Rash    HOME MEDICATIONS: Current Outpatient Medications  Medication Sig Dispense Refill  . lactobacillus acidophilus (BACID) TABS tablet Take 1 tablet by mouth every other day.    . levothyroxine (SYNTHROID) 100 MCG tablet TAKE 1 TABLET BY MOUTH DAILY 90 tablet 0  . oxyCODONE (ROXICODONE) 15 MG immediate release tablet Take 1 tablet by mouth. May take up to 5 times daily    . pregabalin (LYRICA) 150 MG capsule TAKE 1 CAPSULE BY MOUTH THREE TIMES A DAY 270 capsule 0  . ramelteon (ROZEREM) 8 MG tablet Take 1 tablet by mouth daily.    Marland Kitchen topiramate (TOPAMAX) 100 MG tablet TAKE 1 TABLET BY MOUTH TWICE DAILY 180 tablet 0  . traZODone (DESYREL) 50 MG tablet Take 50-150 mg at bedtime as needed by mouth for sleep (and may take one additional 50 mg tablet daily AS NEEDED for part of a "headache cocktail").     . Ubrogepant (UBRELVY) 50 MG TABS Take 50 mg by mouth as needed (may repeat dose in 2 hours, not to exceed 100 mg/day, do not take more than 8 days a month). 10 tablet 11  . venlafaxine (EFFEXOR) 37.5 MG tablet TAKE 1 TABLET BY MOUTH TWICE DAILY 180 tablet 4  . vortioxetine HBr (TRINTELLIX) 20 MG TABS Take 20 mg by mouth daily.     No current facility-administered medications for this visit.    PAST MEDICAL HISTORY: Past  Medical History:  Diagnosis Date  . Anemia    took Fe- orally 2 yrs. ago   . Anxiety    panic attacks   . Arthritis    shoulders, knees  . Cancer ([place])    skin- preCA  . Depression   . GERD (gastroesophageal reflux disease)    uses Protonix as needed   . Headache(784.0)   . Humerus fracture    Right   . Hyposmolality and/or hyponatremia 02/27/2013  . Hypothyroidism   . IBS (irritable bowel syndrome)   . Memory loss   . Mental disorder   . Migraine   . Preoperative examination 02/26/2013  . Seizures ([place])    caused by depression , seritonin seizure - effected short term memory    . Shortness of breath     PAST SURGICAL HISTORY: Past Surgical History:  Procedure Laterality Date  . ABDOMINAL HYSTERECTOMY    . APPENDECTOMY    . BACK SURGERY  1990's  . BREAST SURGERY  1972   augmentation   . CHOLECYSTECTOMY    . GASTRECTOMY  1994   Due to ulcer, required multiple revisions.   Marland Kitchen  HERNIA REPAIR    . ORIF ANKLE FRACTURE Right 2004  . REPLACEMENT TOTAL KNEE    . REVERSE SHOULDER ARTHROPLASTY Right 05/16/2013   Procedure: RIGHT HEMI ARTHROPLASTY  VERSES  REVERSE TOTAL SHOULDER ARTHROPLASTY ;  Surgeon: Augustin Schooling, MD;  Location: [facility];  Service: Orthopedics;  Laterality: Right;  . SPINE SURGERY  2005   Lumbar spinal stenosis   . STOMACH SURGERY     1/3 resection for obstruction  . TONSILLECTOMY    . TUBAL LIGATION      FAMILY HISTORY: Family History  Problem Relation Age of Onset  . Aneurysm Mother   . Heart disease Mother   . Cancer Father        pancreatic  . Stroke Father 33  . Hypertension Father   . Alcohol abuse Father   . Heart disease Maternal Grandfather     SOCIAL HISTORY: Social History   Socioeconomic History  . Marital status: Married    Spouse name: Not on file  . Number of children: 1  . Years of education: College  . Highest education level: Not on file  Occupational History  . Occupation: Retired  Tobacco Use  . Smoking status: Never  Smoker  . Smokeless tobacco: Never Used  Substance and Sexual Activity  . Alcohol use: No    Comment: rare use of white wine   . Drug use: No  . Sexual activity: Not on file  Other Topics Concern  . Not on file  Social History Narrative   Lives at home with her husband.   2 cups caffeine daily.   Right-handed.   Social Determinants of Health   Financial Resource Strain:   . Difficulty of Paying Living Expenses: Not on file  Food Insecurity:   . Worried About Charity fundraiser in the Last Year: Not on file  . Ran Out of Food in the Last Year: Not on file  Transportation Needs:   . Lack of Transportation (Medical): Not on file  . Lack of Transportation (Non-Medical): Not on file  Physical Activity:   . Days of Exercise per Week: Not on file  . Minutes of Exercise per Session: Not on file  Stress:   . Feeling of Stress : Not on file  Social Connections:   . Frequency of Communication with Friends and Family: Not on file  . Frequency of Social Gatherings with Friends and Family: Not on file  . Attends Religious Services: Not on file  . Active Member of Clubs or Organizations: Not on file  . Attends Archivist Meetings: Not on file  . Marital Status: Not on file  Intimate Partner Violence:   . Fear of Current or Ex-Partner: Not on file  . Emotionally Abused: Not on file  . Physically Abused: Not on file  . Sexually Abused: Not on file     PHYSICAL EXAM   Vitals:   10/27/19 1313  BP: 92/60  Pulse: 88  Temp: 97.8 F (36.6 C)  Weight: 203 lb (92.1 kg)  Height: 5\' 4"  (1.626 m)    Not recorded      Body mass index is 34.84 kg/m.  PHYSICAL EXAMNIATION:  Gen: NAD, conversant, well nourised, well groomed                     Cardiovascular: Regular rate rhythm, no peripheral edema, warm, nontender. Eyes: Conjunctivae clear without exudates or hemorrhage Neck: Supple, no carotid bruits. Pulmonary: Clear to auscultation  bilaterally   NEUROLOGICAL  EXAM:  MENTAL STATUS: Speech/cognition:    Speech is normal; fluent and spontaneous with normal comprehension.  Rely on her son to provide history,  CRANIAL NERVES: CN II: Visual fields are full to confrontation. Pupils are round equal and briskly reactive to light. CN III, IV, VI: extraocular movement are normal. No ptosis. CN V: Facial sensation is intact to light touch CN VII: Face is symmetric with normal eye closure  CN VIII: Hearing is normal to causal conversation. CN IX, X: Phonation is normal. CN XI: Head turning and shoulder shrug are intact  MOTOR: There is no pronator drift of out-stretched arms. Muscle bulk and tone are normal. Muscle strength is normal.  REFLEXES: Reflexes are 1 and symmetric at the biceps, triceps, knees, and ankles. Plantar responses are flexor.  SENSORY: Intact to light touch, pinprick and vibratory sensation are intact in fingers and toes.  COORDINATION: There is no trunk or limb dysmetria noted.  GAIT/STANCE: She rely on her walker to get up from seated position, antalgic, unsteady gait   DIAGNOSTIC DATA (LABS, IMAGING, TESTING) - I reviewed patient records, labs, notes, testing and imaging myself where available.   ASSESSMENT AND PLAN  MAICEE ZEBLEY is a 74 y.o. female    Chronic migraine headaches  Overall is under better control, Ubrelvy as needed works well for her Mild cognitive impairment  Slowly progressive, her polypharmacy likely contributed as well Reported history of seizure  Keep Topamax 100 mg twice a day Gait abnormality  Multifactorial, including deconditioning, aging, multiple joints pain,  Will refer her to home physical therapy  Marcial Pacas, M.D. Ph.D.  [facility] [address] [place], [place] [address] Ph: [phone] Fax: [phone]  CC: Referring Provider

## 2019-10-29 ENCOUNTER — Telehealth: Payer: Self-pay | Admitting: Adult Health

## 2019-10-29 NOTE — Telephone Encounter (Signed)
Patient 's son called states pharmacy has sent [place] several request for refill approval on :  Outpatient Medication Detail   Disp Refills Start End   levothyroxine (SYNTHROID) 100 MCG tablet 90 tablet 0 10/27/2019    Sig: TAKE 1 TABLET BY MOUTH DAILY   Sent to pharmacy as: levothyroxine (SYNTHROID) 100 MCG tablet   E-Prescribing Status: Receipt confirmed by pharmacy (10/27/2019 10:06 AM EST)    --advised him provider out of office & unless authorized med asst/ nurse could not order refill but see it was sent on 2/22.  --glh

## 2019-10-30 NOTE — Telephone Encounter (Signed)
Blaine and confirmed receipt of Synthroid medication on 10/27/19.  Pharmacy confirmed receipt and I called patient's son [name] to let him know the medication was ready for pick-up.

## 2019-11-07 ENCOUNTER — Telehealth: Payer: Self-pay | Admitting: Adult Health

## 2019-11-07 NOTE — Telephone Encounter (Signed)
Call in regards to [employer] forms faxed to our office requesting provider signature for Genetic testing of comprehensive hereditary cardiac gene panel.  Dr. Raliegh Scarlet had left me a note on this document requesting me to call patient to be sure this is what she wanted done. I called and spoke with Patients [name] who is on patient DPR who states they fell this is a scam and are not interested in having these labs done. Forms have been placed in shred bin. AS, CMA

## 2019-11-07 NOTE — Telephone Encounter (Signed)
Patient's son [name] trying to reach [place] who was in a room with a patient at the time of call. Please contact [name] when available.

## 2019-11-21 ENCOUNTER — Telehealth: Payer: Self-pay | Admitting: Adult Health

## 2019-11-21 NOTE — Telephone Encounter (Signed)
Per Lovey Newcomer w/ [facility] @[phone] --Pt has advised them not to return as she is quitting w/ the Speech therapy.  --Pt's son has been notified as well.  --Forwarding information update to medical staff.  --Dion Body

## 2019-11-24 NOTE — Telephone Encounter (Signed)
FYI

## 2019-11-30 DIAGNOSIS — G8929 Other chronic pain: Secondary | ICD-10-CM | POA: Diagnosis not present

## 2019-12-01 DIAGNOSIS — Z79891 Long term (current) use of opiate analgesic: Secondary | ICD-10-CM | POA: Insufficient documentation

## 2019-12-16 ENCOUNTER — Ambulatory Visit: Payer: Medicare Other | Admitting: Physician Assistant

## 2019-12-25 NOTE — Progress Notes (Signed)
Established Patient Office Visit  Subjective:  Patient ID: [name], female    DOB: 1946/09/01  Age: 74 y.o. MRN: [id number]  CC:  Chief Complaint  Patient presents with  . Hyperlipidemia  . Hypotension  . Hypothyroidism  . Pain    HPI CLARITZA SCHNARS presents for 3 month chronic follow-up and is accompanied by her son [name]. She reports medication compliance and denies side effects. Denies new onset of chest pain, palpitations, shortness of breath or dizziness. Reports daily, bilateral temporal headaches which feels like pressure around her head. States she takes aspirin to help with headaches. She is unable to quantify how much aspirin but states she "takes a lot," which her son doesn't fully agree because her aspirin bottle isn't empty or low. He doesn't live with patient, but tries to accommodate care and caregivers stay with patient during the day to assist with daily activities.  She has tried Tylenol in the past and that doesn't really provide pain relief. Patient's son feels like her memory is worsening because he has noticed repeat conversations and pt seems more forgetful. Pt disagrees with his concern. Pt states her mood has been stable and doesn't really feel it has changed from baseline. Her dog past away in 10/20 and son feels like this has affected her because he was her companion, so she feels lonely. Patient sees Dr. Toy Care for mood management.     Past Medical History:  Diagnosis Date  . Anemia    took Fe- orally 2 yrs. ago   . Anxiety    panic attacks   . Arthritis    shoulders, knees  . Cancer ([place])    skin- preCA  . Depression   . GERD (gastroesophageal reflux disease)    uses Protonix as needed   . Headache(784.0)   . Humerus fracture    Right   . Hyposmolality and/or hyponatremia 02/27/2013  . Hypothyroidism   . IBS (irritable bowel syndrome)   . Memory loss   . Mental disorder   . Migraine   . Preoperative examination 02/26/2013  . Seizures ([place])    caused by depression , seritonin seizure - effected short term memory    . Shortness of breath     Past Surgical History:  Procedure Laterality Date  . ABDOMINAL HYSTERECTOMY    . APPENDECTOMY    . BACK SURGERY  1990's  . BREAST SURGERY  1972   augmentation   . CHOLECYSTECTOMY    . GASTRECTOMY  1994   Due to ulcer, required multiple revisions.   Marland Kitchen HERNIA REPAIR    . ORIF ANKLE FRACTURE Right 2004  . REPLACEMENT TOTAL KNEE    . REVERSE SHOULDER ARTHROPLASTY Right 05/16/2013   Procedure: RIGHT HEMI ARTHROPLASTY  VERSES  REVERSE TOTAL SHOULDER ARTHROPLASTY ;  Surgeon: Augustin Schooling, MD;  Location: [facility];  Service: Orthopedics;  Laterality: Right;  . SPINE SURGERY  2005   Lumbar spinal stenosis   . STOMACH SURGERY     1/3 resection for obstruction  . TONSILLECTOMY    . TUBAL LIGATION      Family History  Problem Relation Age of Onset  . Aneurysm Mother   . Heart disease Mother   . Cancer Father        pancreatic  . Stroke Father 32  . Hypertension Father   . Alcohol abuse Father   . Heart disease Maternal Grandfather     Social History   Socioeconomic History  .  Marital status: Married    Spouse name: Not on file  . Number of children: 1  . Years of education: College  . Highest education level: Not on file  Occupational History  . Occupation: Retired  Tobacco Use  . Smoking status: Never Smoker  . Smokeless tobacco: Never Used  Substance and Sexual Activity  . Alcohol use: No    Comment: rare use of white wine   . Drug use: No  . Sexual activity: Not on file  Other Topics Concern  . Not on file  Social History Narrative   Lives at home with her husband.   2 cups caffeine daily.   Right-handed.   Social Determinants of Health   Financial Resource Strain:   . Difficulty of Paying Living Expenses:   Food Insecurity:   . Worried About Charity fundraiser in the Last Year:   . Arboriculturist in the Last Year:   Transportation Needs:   . Lexicographer (Medical):   Marland Kitchen Lack of Transportation (Non-Medical):   Physical Activity:   . Days of Exercise per Week:   . Minutes of Exercise per Session:   Stress:   . Feeling of Stress :   Social Connections:   . Frequency of Communication with Friends and Family:   . Frequency of Social Gatherings with Friends and Family:   . Attends Religious Services:   . Active Member of Clubs or Organizations:   . Attends Archivist Meetings:   Marland Kitchen Marital Status:   Intimate Partner Violence:   . Fear of Current or Ex-Partner:   . Emotionally Abused:   Marland Kitchen Physically Abused:   . Sexually Abused:     Outpatient Medications Prior to Visit  Medication Sig Dispense Refill  . lactobacillus acidophilus (BACID) TABS tablet Take 1 tablet by mouth every other day.    . levothyroxine (SYNTHROID) 100 MCG tablet TAKE 1 TABLET BY MOUTH DAILY 90 tablet 0  . oxyCODONE (ROXICODONE) 15 MG immediate release tablet Take 1 tablet by mouth. May take up to 5 times daily    . pregabalin (LYRICA) 150 MG capsule TAKE 1 CAPSULE BY MOUTH THREE TIMES A DAY 270 capsule 0  . ramelteon (ROZEREM) 8 MG tablet Take 1 tablet by mouth daily.    Marland Kitchen topiramate (TOPAMAX) 100 MG tablet Take 1 tablet (100 mg total) by mouth 2 (two) times daily. 180 tablet 4  . traZODone (DESYREL) 50 MG tablet Take 50-150 mg at bedtime as needed by mouth for sleep (and may take one additional 50 mg tablet daily AS NEEDED for part of a "headache cocktail").     . Ubrogepant (UBRELVY) 50 MG TABS Take 50 mg by mouth as needed (may repeat dose in 2 hours, not to exceed 100 mg/day, do not take more than 8 days a month). 10 tablet 11  . venlafaxine (EFFEXOR) 37.5 MG tablet TAKE 1 TABLET BY MOUTH TWICE DAILY 180 tablet 4  . vortioxetine HBr (TRINTELLIX) 20 MG TABS Take 20 mg by mouth daily.     No facility-administered medications prior to visit.    Allergies  Allergen Reactions  . Bupropion Hcl Other (See Comments)    SEIZURES!!  . Dairy Aid  [Lactase] Diarrhea  . Azithromycin Rash  . Erythromycin Rash    ROS Review of Systems  A fourteen system review of systems was performed and found to be positive as per HPI.   Objective:    Physical Exam  General: Appears stated age, in no acute distress  Neuro:  Alert and oriented,  extra-ocular muscles intact  HEENT:  Normocephalic, atraumatic Skin:  no gross rash, warm, pink. Cardiac:  RRR Respiratory:  ECTA B/L and A/P, Not using accessory muscles, speaking in full sentences- unlabored. Vascular:  Ext warm, no cyanosis apprec.; cap RF less 2 sec. Psych:  No HI/SI, judgement and insight stable, mood stable.  BP (!) 89/59   Pulse (!) 105   Temp 98.6 F (37 C) (Oral)   Ht 5' 5.25" (1.657 m)   Wt 209 lb 9.6 oz (95.1 kg)   SpO2 90% Comment: on RA  BMI 34.61 kg/m  Wt Readings from Last 3 Encounters:  12/26/19 209 lb 9.6 oz (95.1 kg)  10/27/19 203 lb (92.1 kg)  07/18/19 197 lb 14.4 oz (89.8 kg)     Health Maintenance Due  Topic Date Due  . COVID-19 Vaccine (1) Never done  . TETANUS/TDAP  02/02/2013    There are no preventive care reminders to display for this patient.  Lab Results  Component Value Date   TSH 1.180 02/04/2019   Lab Results  Component Value Date   WBC 6.2 02/04/2019   HGB 11.2 02/04/2019   HCT 34.9 02/04/2019   MCV 76 (L) 02/04/2019   PLT 171 02/04/2019   Lab Results  Component Value Date   NA 140 02/04/2019   K 4.5 02/04/2019   CO2 23 02/04/2019   GLUCOSE 83 02/04/2019   BUN 13 02/04/2019   CREATININE 0.81 02/04/2019   BILITOT 0.4 02/04/2019   ALKPHOS 70 02/04/2019   AST 22 02/04/2019   ALT 10 02/04/2019   PROT 6.2 02/04/2019   ALBUMIN 4.1 02/04/2019   CALCIUM 8.9 02/04/2019   ANIONGAP 6 07/10/2017   Lab Results  Component Value Date   CHOL 179 02/04/2019   Lab Results  Component Value Date   HDL 83 02/04/2019   Lab Results  Component Value Date   LDLCALC 78 02/04/2019   Lab Results  Component Value Date   TRIG 89  02/04/2019   Lab Results  Component Value Date   CHOLHDL 2.2 02/04/2019   Lab Results  Component Value Date   HGBA1C 5.6 07/18/2019      Assessment & Plan:   Problem List Items Addressed This Visit      Cardiovascular and Mediastinum   Hypotension     Other   Iron deficiency anemia   Memory loss   Gait abnormality    Other Visit Diagnoses    Prediabetes    -  Primary   Depression, recurrent (HCC)       Chronic tension-type headache, not intractable          Hypotension: - BP today is 89/59, stable. Pt's baseline is in the lower range. - Continue to stay well hydrated-at least 64 fl oz and follow a heart healthy diet. - At follow-up OV plan to discuss possible referral to cardiology. Pt also has hx of diastolic CHF.  - Recommend to check BP and pulse at home and keep a log.  Mood management, Depression: - Pt's PHQ9 today is 17. Denies SI/HI. - She is followed by Psychiatry- Dr. Toy Care - [name] reports pt recently had telemed visit (03/20) and advised to contact Dr. Toy Care if patient's mood worsens.  Pre-diabetes: - Most recent A1c 5.6, stable. - Continue low carb/glucose diet - Will continue to monitor   Iron deficiency anemia: - Pt didn't tolerate ferrous sulfate well  and continues to decline referral to hem/onc for iron transfusions. - Will continue to monitor and plan to recheck cbc at yearly [facility].  Memory loss: - Pt is followed by neurology- Dr. Krista Blue - Mordecai Maes to continue monitoring for changes from pt's baseline memory and notify neurologist of his concern. - Encouraged daily stimulating activities.  Gait abnormality: - Continue to ambulate with walker. - [facility] notified office pt advised them not to return because she was quitting w/ Speech therapy, so PT was discontinued. - Pt states she didn't cancel, she actually completed therapy and even received graduation certificate.  - Will continue to monitor and will consider new PT referral if  condition worsening. Per son, pt has gone through several home health agencies.  Chronic tension-type headache: - Continue to use aspirin as needed for pain, do not exceed 4 grams/day.  - Follow-up with Neurology for further management and possible treatment alternatives such as trigger point injections. Aspirin is an NSAID and chronic use could lead to side effects such as decreased kidney function, internal bleeding, stomach ulcers etc. Pt verbalized understanding.  No orders of the defined types were placed in this encounter.   Follow-up: Return for chornic follow-up in 3-4 mons.    Lorrene Reid, PA-C

## 2019-12-26 ENCOUNTER — Encounter: Payer: Self-pay | Admitting: Physician Assistant

## 2019-12-26 ENCOUNTER — Ambulatory Visit (INDEPENDENT_AMBULATORY_CARE_PROVIDER_SITE_OTHER): Payer: Medicare Other | Admitting: Physician Assistant

## 2019-12-26 ENCOUNTER — Other Ambulatory Visit: Payer: Self-pay

## 2019-12-26 VITALS — BP 89/59 | HR 105 | Temp 98.6°F | Ht 65.25 in | Wt 209.6 lb

## 2019-12-26 DIAGNOSIS — R7303 Prediabetes: Secondary | ICD-10-CM | POA: Diagnosis not present

## 2019-12-26 DIAGNOSIS — I959 Hypotension, unspecified: Secondary | ICD-10-CM | POA: Diagnosis not present

## 2019-12-26 DIAGNOSIS — R269 Unspecified abnormalities of gait and mobility: Secondary | ICD-10-CM | POA: Diagnosis not present

## 2019-12-26 DIAGNOSIS — F339 Major depressive disorder, recurrent, unspecified: Secondary | ICD-10-CM

## 2019-12-26 DIAGNOSIS — D509 Iron deficiency anemia, unspecified: Secondary | ICD-10-CM | POA: Diagnosis not present

## 2019-12-26 DIAGNOSIS — R413 Other amnesia: Secondary | ICD-10-CM | POA: Diagnosis not present

## 2019-12-26 DIAGNOSIS — G44229 Chronic tension-type headache, not intractable: Secondary | ICD-10-CM | POA: Diagnosis not present

## 2019-12-26 NOTE — Patient Instructions (Signed)
Tension Headache, Adult A tension headache is pain, pressure, or aching in your head. Tension headaches can last from 30 minutes to several days. Follow these instructions at home: Managing pain  Take over-the-counter and prescription medicines only as told by your doctor.  When you have a headache, lie down in a dark, quiet room.  If told, put ice on your head and neck: ? Put ice in a plastic bag. ? Place a towel between your skin and the bag. ? Leave the ice on for 20 minutes, 2-3 times a day.  If told, put heat on the back of your neck. Do this as often as your doctor tells you to. Use the kind of heat that your doctor recommends, such as a moist heat pack or a heating pad. ? Place a towel between your skin and the heat. ? Leave the heat on for 20-30 minutes. ? Remove the heat if your skin turns bright red. Eating and drinking  Eat meals on a regular schedule.  Watch how much alcohol you drink: ? If you are a woman and are not pregnant, do not drink more than 1 drink a day. ? If you are a man, do not drink more than 2 drinks a day.  Drink enough fluid to keep your pee (urine) pale yellow.  Do not use a lot of caffeine, or stop using caffeine. Lifestyle  Get enough sleep. Get 7-9 hours of sleep each night. Or get the amount of sleep that your doctor tells you to.  At bedtime, remove all electronic devices from your room. Examples of electronic devices are computers, phones, and tablets.  Find ways to lessen your stress. Some things that can lessen stress are: ? Exercise. ? Deep breathing. ? Yoga. ? Music. ? Positive thoughts.  Sit up straight. Do not tighten (tense) your muscles.  Do not use any products that have nicotine or tobacco in them, such as cigarettes and e-cigarettes. If you need help quitting, ask your doctor. General instructions   Keep all follow-up visits as told by your doctor. This is important.  Avoid things that can bring on headaches. Keep a  journal to find out if certain things bring on headaches. For example, write down: ? What you eat and drink. ? How much sleep you get. ? Any change to your diet or medicines. Contact a doctor if:  Your headache does not get better.  Your headache comes back.  You have a headache and sounds, light, or smells bother you.  You feel sick to your stomach (nauseous) or you throw up (vomit).  Your stomach hurts. Get help right away if:  You suddenly get a very bad headache along with any of these: ? A stiff neck. ? Feeling sick to your stomach. ? Throwing up. ? Feeling weak. ? Trouble seeing. ? Feeling short of breath. ? A rash. ? Feeling unusually sleepy. ? Trouble speaking. ? Pain in your eye or ear. ? Trouble walking or balancing. ? Feeling like you will pass out (faint). ? Passing out. Summary  A tension headache is pain, pressure, or aching in your head.  Tension headaches can last from 30 minutes to several days.  Lifestyle changes and medicines may help relieve pain. This information is not intended to replace advice given to you by your health care provider. Make sure you discuss any questions you have with your health care provider. Document Revised: 06/18/2019 Document Reviewed: 12/01/2016 Elsevier Patient Education  2020 Elsevier Inc.  

## 2020-01-06 ENCOUNTER — Other Ambulatory Visit: Payer: Self-pay | Admitting: Neurology

## 2020-01-30 ENCOUNTER — Other Ambulatory Visit: Payer: Self-pay

## 2020-01-30 MED ORDER — LEVOTHYROXINE SODIUM 100 MCG PO TABS: 100.0000 ug | ORAL_TABLET | Freq: Every day | ORAL | 0 refills | Status: DC

## 2020-03-19 ENCOUNTER — Ambulatory Visit (INDEPENDENT_AMBULATORY_CARE_PROVIDER_SITE_OTHER): Payer: Medicare Other | Admitting: Physician Assistant

## 2020-03-19 ENCOUNTER — Other Ambulatory Visit: Payer: Self-pay

## 2020-03-19 VITALS — BP 108/68 | HR 98 | Temp 98.2°F | Ht 65.25 in | Wt 210.5 lb

## 2020-03-19 DIAGNOSIS — R7303 Prediabetes: Secondary | ICD-10-CM

## 2020-03-19 DIAGNOSIS — D509 Iron deficiency anemia, unspecified: Secondary | ICD-10-CM | POA: Diagnosis not present

## 2020-03-19 DIAGNOSIS — I5030 Unspecified diastolic (congestive) heart failure: Secondary | ICD-10-CM | POA: Diagnosis not present

## 2020-03-19 DIAGNOSIS — E039 Hypothyroidism, unspecified: Secondary | ICD-10-CM

## 2020-03-19 DIAGNOSIS — Z87898 Personal history of other specified conditions: Secondary | ICD-10-CM

## 2020-03-19 DIAGNOSIS — I959 Hypotension, unspecified: Secondary | ICD-10-CM

## 2020-03-19 LAB — POCT GLYCOSYLATED HEMOGLOBIN (HGB A1C): Hemoglobin A1C: 5.5 % (ref 4.0–5.6)

## 2020-03-19 NOTE — Progress Notes (Signed)
Established Patient Office Visit  Subjective:  Patient ID: [name], female    DOB: 1946-02-18  Age: 74 y.o. MRN: [id number]  CC:  Chief Complaint  Patient presents with   Hyperlipidemia   Hypertension   Hypothyroidism   Pre- DM    HPI [name] presents for hyperlipidemia, hypotension, hypothyroidism, and prediabetes. Patient reports she has been doing fine. She does report an episode of chest pain that happened 2 months ago and woke her up from her sleep. States pain was severe and central, radiated to arm and neck. She also reports she couldn't see and then had a severe headache. Unsure how long chest pain lasted but wasn't long. Denies any further chest pain episodes. Denies dizziness, palpitations, or increased shortness of breath from baseline. She did not mention her chest pain episode to her son and did not seek treatment.  Iron deficiency anemia: Denies fatigue, melena or hematochezia. She is unable to tolerate oral iron supplement and has declined iron infusions in the past.   Hypothyroid: Denies symptoms of bowel changes, palpitations, hair loss/thinning, or cold intolerance.  Reports medication compliance.  Past Medical History:  Diagnosis Date   Anemia    took Fe- orally 2 yrs. ago    Anxiety    panic attacks    Arthritis    shoulders, knees   Cancer (HCC)    skin- preCA   Depression    GERD (gastroesophageal reflux disease)    uses Protonix as needed    Headache(784.0)    Humerus fracture    Right    Hyposmolality and/or hyponatremia 02/27/2013   Hypothyroidism    IBS (irritable bowel syndrome)    Memory loss    Mental disorder    Migraine    Preoperative examination 02/26/2013   Seizures ([place])    caused by depression , seritonin seizure - effected short term memory     Shortness of breath     Past Surgical History:  Procedure Laterality Date   ABDOMINAL HYSTERECTOMY     APPENDECTOMY     BACK SURGERY  1990's    BREAST SURGERY  1972   augmentation    CHOLECYSTECTOMY     GASTRECTOMY  1994   Due to ulcer, required multiple revisions.    HERNIA REPAIR     ORIF ANKLE FRACTURE Right 2004   REPLACEMENT TOTAL KNEE     REVERSE SHOULDER ARTHROPLASTY Right 05/16/2013   Procedure: RIGHT HEMI ARTHROPLASTY  VERSES  REVERSE TOTAL SHOULDER ARTHROPLASTY ;  Surgeon: Augustin Schooling, MD;  Location: [facility];  Service: Orthopedics;  Laterality: Right;   SPINE SURGERY  2005   Lumbar spinal stenosis    STOMACH SURGERY     1/3 resection for obstruction   TONSILLECTOMY     TUBAL LIGATION      Family History  Problem Relation Age of Onset   Aneurysm Mother    Heart disease Mother    Cancer Father        pancreatic   Stroke Father 34   Hypertension Father    Alcohol abuse Father    Heart disease Maternal Grandfather     Social History   Socioeconomic History   Marital status: Married    Spouse name: Not on file   Number of children: 1   Years of education: College   Highest education level: Not on file  Occupational History   Occupation: Retired  Tobacco Use   Smoking status: Never Smoker  Smokeless tobacco: Never Used  Substance and Sexual Activity   Alcohol use: No    Comment: rare use of white wine    Drug use: No   Sexual activity: Not on file  Other Topics Concern   Not on file  Social History Narrative   Lives at home with her husband.   2 cups caffeine daily.   Right-handed.   Social Determinants of Health   Financial Resource Strain:    Difficulty of Paying Living Expenses:   Food Insecurity:    Worried About Charity fundraiser in the Last Year:    Arboriculturist in the Last Year:   Transportation Needs:    Film/video editor (Medical):    Lack of Transportation (Non-Medical):   Physical Activity:    Days of Exercise per Week:    Minutes of Exercise per Session:   Stress:    Feeling of Stress :   Social Connections:    Frequency of  Communication with Friends and Family:    Frequency of Social Gatherings with Friends and Family:    Attends Religious Services:    Active Member of Clubs or Organizations:    Attends Music therapist:    Marital Status:   Intimate Partner Violence:    Fear of Current or Ex-Partner:    Emotionally Abused:    Physically Abused:    Sexually Abused:     Outpatient Medications Prior to Visit  Medication Sig Dispense Refill   lactobacillus acidophilus (BACID) TABS tablet Take 1 tablet by mouth every other day.     levothyroxine (SYNTHROID) 100 MCG tablet Take 1 tablet (100 mcg total) by mouth daily. 90 tablet 0   oxyCODONE (ROXICODONE) 15 MG immediate release tablet Take 1 tablet by mouth. May take up to 5 times daily     pregabalin (LYRICA) 150 MG capsule TAKE 1 CAPSULE BY MOUTH THREE TIMES A DAY 270 capsule 1   ramelteon (ROZEREM) 8 MG tablet Take 1 tablet by mouth daily.     topiramate (TOPAMAX) 100 MG tablet Take 1 tablet (100 mg total) by mouth 2 (two) times daily. 180 tablet 4   traZODone (DESYREL) 50 MG tablet Take 50-150 mg at bedtime as needed by mouth for sleep (and may take one additional 50 mg tablet daily AS NEEDED for part of a "headache cocktail").      Ubrogepant (UBRELVY) 50 MG TABS Take 50 mg by mouth as needed (may repeat dose in 2 hours, not to exceed 100 mg/day, do not take more than 8 days a month). 10 tablet 11   venlafaxine (EFFEXOR) 37.5 MG tablet TAKE 1 TABLET BY MOUTH TWICE DAILY 180 tablet 4   vortioxetine HBr (TRINTELLIX) 20 MG TABS Take 20 mg by mouth daily.     No facility-administered medications prior to visit.    Allergies  Allergen Reactions   Bupropion Hcl Other (See Comments)    SEIZURES!!   Dairy Aid [Lactase] Diarrhea   Azithromycin Rash   Erythromycin Rash    ROS Review of Systems Review of Systems:  A fourteen system review of systems was performed and found to be positive as per HPI.  Objective:     Physical Exam Constitutional:      General: She is not in acute distress.    Appearance: Normal appearance.  HENT:     Head: Normocephalic and atraumatic.  Eyes:     Extraocular Movements: Extraocular movements intact.  Cardiovascular:  Rate and Rhythm: Normal rate and regular rhythm.     Pulses: Normal pulses.     Heart sounds: Normal heart sounds. No murmur heard.   Pulmonary:     Effort: Pulmonary effort is normal.     Breath sounds: Normal breath sounds. No wheezing.  Skin:    General: Skin is warm.     Findings: No rash.  Neurological:     Mental Status: She is alert. Mental status is at baseline.  Psychiatric:        Mood and Affect: Mood normal.        Behavior: Behavior normal.     BP 108/68    Pulse 98    Temp 98.2 F (36.8 C) (Oral)    Ht 5' 5.25" (1.657 m)    Wt 210 lb 8 oz (95.5 kg)    SpO2 92% Comment: on RA   BMI 34.76 kg/m  Wt Readings from Last 3 Encounters:  03/19/20 210 lb 8 oz (95.5 kg)  12/26/19 209 lb 9.6 oz (95.1 kg)  10/27/19 203 lb (92.1 kg)     Health Maintenance Due  Topic Date Due   COVID-19 Vaccine (1) Never done   TETANUS/TDAP  02/02/2013    There are no preventive care reminders to display for this patient.  Lab Results  Component Value Date   TSH 2.980 03/19/2020   Lab Results  Component Value Date   WBC 5.0 03/19/2020   HGB 10.7 (L) 03/19/2020   HCT 35.9 03/19/2020   MCV 79 03/19/2020   PLT 217 03/19/2020   Lab Results  Component Value Date   NA 133 (L) 03/19/2020   K 4.1 03/19/2020   CO2 18 (L) 03/19/2020   GLUCOSE 81 03/19/2020   BUN 13 03/19/2020   CREATININE 0.99 03/19/2020   BILITOT 0.2 03/19/2020   ALKPHOS 76 03/19/2020   AST 19 03/19/2020   ALT 9 03/19/2020   PROT 6.5 03/19/2020   ALBUMIN 4.3 03/19/2020   CALCIUM 9.0 03/19/2020   ANIONGAP 6 07/10/2017   Lab Results  Component Value Date   CHOL 179 02/04/2019   Lab Results  Component Value Date   HDL 83 02/04/2019   Lab Results  Component  Value Date   LDLCALC 78 02/04/2019   Lab Results  Component Value Date   TRIG 89 02/04/2019   Lab Results  Component Value Date   CHOLHDL 2.2 02/04/2019   Lab Results  Component Value Date   HGBA1C 5.5 03/19/2020      Assessment & Plan:   Problem List Items Addressed This Visit      Cardiovascular and Mediastinum   Diastolic CHF ([place])    -Per chart review, last echo was in 2010: Impressions:  Normal LV systolic function with EF 60-65%. Mild LV hypertrophy. Moderate diastolic dysfunction. Mild pulmonary hypertension may be secondary to elevated LV filling pressure. The RV is mildly dilated with normal systolic function. -Patient denies worsening shortness of breath. -Placed cardiology referral.       Relevant Orders   Ambulatory referral to Cardiology   Hypotension    -BP today is 105/68 HR 97, stable. -Encouraged to continue ambulatory BP and HR monitoring. -Continue good hydration. -Will continue to monitor.      Relevant Orders   Comp Met (CMET) (Completed)     Endocrine   Hypothyroidism    -Asymptomatic. -Last TSH 1.180 -Continue levothyroxine 100 mcg. -Rechecking TSH today.      Relevant Orders   TSH (Completed)  Other   Iron deficiency anemia    -Asymptomatic. -Checking CBC and iron panel today.      Relevant Orders   CBC w/Diff (Completed)   Iron, TIBC and Ferritin Panel (Completed)    Other Visit Diagnoses    Prediabetes    -  Primary   Relevant Orders   POCT glycosylated hemoglobin (Hb A1C) (Completed)   History of chest pain       Relevant Orders   EKG 12-Lead   Ambulatory referral to Cardiology     Prediabetes: -A1c today 5.5, stable. -Recommend to follow low carbohydrate and glucose diet. -Will continue to monitor.  History of chest pain episode: -Performed EKG in the office, which revealed changes when compared to prior EKG (2018): T wave inversions lead V3; NSR, no acute ST-T changes, rate 73 -Chest pain episode occurred  two months ago, currently asymptomatic and no other episodes since then, and EKG changes could possibly be related to prior ischemia/infarction so discussed referral to cardiology for further evaluation. Patient and son both agreeable. -Discussed red flag signs and symptoms with patient, and the importance to seek immediate medical care and calling 911 if she becomes symptomatic. Pt verbalized understanding.  -Currently takes aspirin on daily basis to help with headaches and recommend to take 81 mg once daily.   No orders of the defined types were placed in this encounter.   Follow-up: Return in about 3 months (around 06/19/2020) for Hypothyroid, HLD, hypotension.    Lorrene Reid, PA-C

## 2020-03-19 NOTE — Assessment & Plan Note (Signed)
-  Asymptomatic. -Last TSH 1.180 -Continue levothyroxine 100 mcg. -Rechecking TSH today.

## 2020-03-19 NOTE — Assessment & Plan Note (Signed)
-  Asymptomatic. -Checking CBC and iron panel today.

## 2020-03-19 NOTE — Assessment & Plan Note (Signed)
-  Per chart review, last echo was in 2010: Impressions:  Normal LV systolic function with EF 60-65%. Mild LV hypertrophy. Moderate diastolic dysfunction. Mild pulmonary hypertension may be secondary to elevated LV filling pressure. The RV is mildly dilated with normal systolic function. -Patient denies worsening shortness of breath. -Placed cardiology referral.

## 2020-03-19 NOTE — Assessment & Plan Note (Signed)
-  BP today is 105/68 HR 97, stable. -Encouraged to continue ambulatory BP and HR monitoring. -Continue good hydration. -Will continue to monitor.

## 2020-03-19 NOTE — Patient Instructions (Signed)

## 2020-03-20 LAB — CBC WITH DIFFERENTIAL/PLATELET
Basophils Absolute: 0.1 10*3/uL (ref 0.0–0.2)
Basos: 1 %
EOS (ABSOLUTE): 0.2 10*3/uL (ref 0.0–0.4)
Eos: 4 %
Hematocrit: 35.9 % (ref 34.0–46.6)
Hemoglobin: 10.7 g/dL — ABNORMAL LOW (ref 11.1–15.9)
Immature Grans (Abs): 0 10*3/uL (ref 0.0–0.1)
Immature Granulocytes: 0 %
Lymphocytes Absolute: 1.4 10*3/uL (ref 0.7–3.1)
Lymphs: 28 %
MCH: 23.4 pg — ABNORMAL LOW (ref 26.6–33.0)
MCHC: 29.8 g/dL — ABNORMAL LOW (ref 31.5–35.7)
MCV: 79 fL (ref 79–97)
Monocytes Absolute: 0.6 10*3/uL (ref 0.1–0.9)
Monocytes: 12 %
Neutrophils Absolute: 2.8 10*3/uL (ref 1.4–7.0)
Neutrophils: 55 %
Platelets: 217 10*3/uL (ref 150–450)
RBC: 4.57 x10E6/uL (ref 3.77–5.28)
RDW: 16 % — ABNORMAL HIGH (ref 11.7–15.4)
WBC: 5 10*3/uL (ref 3.4–10.8)

## 2020-03-20 LAB — IRON,TIBC AND FERRITIN PANEL
Ferritin: 10 ng/mL — ABNORMAL LOW (ref 15–150)
Iron Saturation: 5 % — CL (ref 15–55)
Iron: 25 ug/dL — ABNORMAL LOW (ref 27–139)
Total Iron Binding Capacity: 506 ug/dL — ABNORMAL HIGH (ref 250–450)
UIBC: 481 ug/dL — ABNORMAL HIGH (ref 118–369)

## 2020-03-20 LAB — COMPREHENSIVE METABOLIC PANEL
ALT: 9 IU/L (ref 0–32)
AST: 19 IU/L (ref 0–40)
Albumin/Globulin Ratio: 2 (ref 1.2–2.2)
Albumin: 4.3 g/dL (ref 3.7–4.7)
Alkaline Phosphatase: 76 IU/L (ref 48–121)
BUN/Creatinine Ratio: 13 (ref 12–28)
BUN: 13 mg/dL (ref 8–27)
Bilirubin Total: 0.2 mg/dL (ref 0.0–1.2)
CO2: 18 mmol/L — ABNORMAL LOW (ref 20–29)
Calcium: 9 mg/dL (ref 8.7–10.3)
Chloride: 101 mmol/L (ref 96–106)
Creatinine, Ser: 0.99 mg/dL (ref 0.57–1.00)
GFR calc Af Amer: 64 mL/min/{1.73_m2} (ref >59)
GFR calc non Af Amer: 56 mL/min/{1.73_m2} — ABNORMAL LOW (ref >59)
Globulin, Total: 2.2 g/dL (ref 1.5–4.5)
Glucose: 81 mg/dL (ref 65–99)
Potassium: 4.1 mmol/L (ref 3.5–5.2)
Sodium: 133 mmol/L — ABNORMAL LOW (ref 134–144)
Total Protein: 6.5 g/dL (ref 6.0–8.5)

## 2020-03-20 LAB — TSH: TSH: 2.98 u[IU]/mL (ref 0.450–4.500)

## 2020-03-24 ENCOUNTER — Telehealth: Payer: Self-pay | Admitting: Physician Assistant

## 2020-03-24 DIAGNOSIS — D509 Iron deficiency anemia, unspecified: Secondary | ICD-10-CM

## 2020-03-24 NOTE — Telephone Encounter (Signed)
-----   Message from Lorrene Reid, [place] sent at 03/23/2020  1:01 PM EDT ----- Please call Ms. Herdt and notify of lab results. CMP is stable, TSH is wnl, cbc and iron panel consistent with iron deficiency anemia. Patient has intolerance to oral iron supplement  so recommend to increase dietary intake of iron such as fortified cereals, nuts, lentils, beans etc. and also recommend referral to hem/onc for IV iron transfusions which she has declined in the past. Please inquire if she is agreeable for referral.   Thank you, Herb Grays

## 2020-03-24 NOTE — Telephone Encounter (Signed)
error 

## 2020-03-26 ENCOUNTER — Telehealth: Payer: Self-pay | Admitting: Hematology and Oncology

## 2020-03-26 NOTE — Telephone Encounter (Signed)
Received a new hem referral from [name], [place] for IDA. [name] has been scheduled to see Dr. Lorenso [name] on 8/13 at 1pm. Appt date and time has been given to the pt's son. Aware to arrive 15 minutes early.

## 2020-03-30 ENCOUNTER — Encounter: Payer: Self-pay | Admitting: Physician Assistant

## 2020-04-02 DIAGNOSIS — G894 Chronic pain syndrome: Secondary | ICD-10-CM | POA: Diagnosis not present

## 2020-04-02 DIAGNOSIS — M545 Low back pain: Secondary | ICD-10-CM | POA: Diagnosis not present

## 2020-04-02 DIAGNOSIS — Z79891 Long term (current) use of opiate analgesic: Secondary | ICD-10-CM | POA: Diagnosis not present

## 2020-04-13 ENCOUNTER — Telehealth: Payer: Self-pay | Admitting: Oncology

## 2020-04-13 NOTE — Telephone Encounter (Signed)
Pt's son cld to reschedule [name] new hem appt to 8/27 at 2pm to see Dr. Alen Blew.

## 2020-04-16 ENCOUNTER — Inpatient Hospital Stay: Payer: Medicare Other

## 2020-04-16 ENCOUNTER — Inpatient Hospital Stay: Payer: Medicare Other | Admitting: Hematology and Oncology

## 2020-04-19 ENCOUNTER — Other Ambulatory Visit: Payer: Self-pay

## 2020-04-19 ENCOUNTER — Ambulatory Visit (INDEPENDENT_AMBULATORY_CARE_PROVIDER_SITE_OTHER): Payer: Medicare Other | Admitting: Physician Assistant

## 2020-04-19 VITALS — Ht 65.25 in | Wt 210.0 lb

## 2020-04-19 DIAGNOSIS — R300 Dysuria: Secondary | ICD-10-CM | POA: Diagnosis not present

## 2020-04-19 DIAGNOSIS — N3001 Acute cystitis with hematuria: Secondary | ICD-10-CM

## 2020-04-19 LAB — POCT URINALYSIS DIPSTICK
Bilirubin, UA: NEGATIVE
Glucose, UA: NEGATIVE
Nitrite, UA: POSITIVE
Protein, UA: NEGATIVE
Spec Grav, UA: 1.025 (ref 1.010–1.025)
Urobilinogen, UA: 0.2 E.U./dL
pH, UA: 5.5 (ref 5.0–8.0)

## 2020-04-19 MED ORDER — NITROFURANTOIN MONOHYD MACRO 100 MG PO CAPS: 100.0000 mg | ORAL_CAPSULE | Freq: Two times a day (BID) | ORAL | 0 refills | Status: DC

## 2020-04-19 NOTE — Progress Notes (Signed)
Patient complaining of dysuria. Urinalysis revealed trace of blood, positive nitrites, and trace of leukocytes. Will send for urine culture and start empiric antibiotic with Nitrofurantoin. Pending urine culture, will change antibiotic if indicated.  Lorrene Reid, PA-C

## 2020-04-21 LAB — URINE CULTURE

## 2020-04-22 ENCOUNTER — Other Ambulatory Visit: Payer: Self-pay | Admitting: Physician Assistant

## 2020-04-26 ENCOUNTER — Telehealth: Payer: Medicare Other | Admitting: Neurology

## 2020-04-26 NOTE — Progress Notes (Deleted)
Virtual Visit via Video Note  I connected with [name] on 04/26/20 at 12:45 PM EDT by a video enabled telemedicine application and verified that I am speaking with the correct person using two identifiers.  Location: Patient: *** Provider: ***   I discussed the limitations of evaluation and management by telemedicine and the availability of in person appointments. The patient expressed understanding and agreed to proceed.  History of Present Illness: [name] a 74 years old right-handed female, accompanied by her husband [name], seen in refer by her primary care physician nurse practitioner [name] for evaluation of migraine, memory loss, initial evaluation was onMarch 5 2018.  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  She reported lifelong history of migraine headaches, her typical migraine are right side severe pounding headache with associated light noise sensitivity, nauseous, lasting for 3 days, she was previously treated by neurologist at [facility] with Lyrica 400 mg daily, tizanidine Frova as needed, oxycodone 20 mg 4 times a day,  Her physician changed at the beginning of the year, she was tapered off oxycodone, since then, she complained of frequent headaches, about once a week severe right side migraine headache, lasting for 3  days, has to go to emergency room,  She has chronic gait abnormality due to right knee pain, she had right knee replacement, left knee severe arthritis disease.B12 level was 1300,  Update December 05 2016: Reviewed laboratory evaluation on November 06 2016,no significant abnormality,CMP, C-reactive protein, folic acid, negative RPR, We personally reviewed MRI of the brain without contrast on November 19 2016: Moderate cortical atrophy, no significant change compared to previous scan in 2011, chronic bilateral parietal lobe encephalomalacia, chronic hemo-product on the left side, chronic microvascular ischemic changes in the cerebral hemisphere, and in the left cerebellar hemisphere,  She is taking Diclofenac prn for her headaches. She complains almost daily bilateral retro-orbital area pressure pain, lasting for a few hours,  She also complains of increased, gradual worsening gait abnormality, urinary incontinence.  UPDATE Jun 11 2017: She complains of constant headache, lateralized severe pounding headache, lasting for 2 days, she is now taking frequent almost daily diclofenac without much help, she is also taking Topamax 100 mg every day at nighttime as preventive medication, which did not make much difference,  She has tried BOTOX Injection in the past, without help, she is not a candidate for triptan treatment because evidence of previous stroke, multiple vascular risk factors  MRI of cervical in April 2018 showed evidence of multilevel degenerative changes, most severe at C4-5, C5-6, with evidence of disc bulging, severe bilateral foraminal narrowing  UPDATE Sept 3 2019: Her insurance denies Amovig, she is now taking effexor 37.5mg  bid, which did help her migraine some, she reported mild improvement, she is now having migraine 2 times a week, sleeps helps her migraine, she is now taking oycodone 15mg  up to five times a  day, diclofenac as needed.  She still has trouble with her memory. MMSE  is 21/30 today.  UPDATE Oct 27 2019: She is accompanied by her [name] at today's visit, her migraine headache is overall doing very well, only has couple migraines each month, Roselyn Meier works well for her migraine,  She was noted to have gradual worsening memory loss, especially since her husband recent stay at nursing home, she has low back, knee hip pain, on polypharmacy treatment, including oxycodone 15 mg 4 times a day, Lyrica 150 mg 3 times a day, trazodone 50 mg at bedtime, Effexor, Trintellix, and Topamax 100 mg twice a day, she had a history of seizure, has not had recurrent seizure for years  Update April 26, 2020 SS:    Observations/Objective:   Assessment and Plan:   Follow Up Instructions:    I discussed the assessment and treatment plan with the patient. The patient was provided an opportunity to ask questions and all were answered. The patient agreed with the plan and demonstrated an understanding of the instructions.   The patient was advised to call back or seek an in-person evaluation if the symptoms worsen or if the condition fails to improve as anticipated.  I provided *** minutes of non-face-to-face time during this encounter.   Suzzanne Cloud, NP

## 2020-04-30 ENCOUNTER — Other Ambulatory Visit: Payer: Self-pay

## 2020-04-30 ENCOUNTER — Telehealth: Payer: Self-pay | Admitting: Oncology

## 2020-04-30 ENCOUNTER — Inpatient Hospital Stay: Payer: Medicare Other | Attending: Hematology and Oncology | Admitting: Oncology

## 2020-04-30 VITALS — BP 129/66 | HR 77 | Temp 98.3°F | Resp 18 | Ht 65.25 in | Wt 213.7 lb

## 2020-04-30 DIAGNOSIS — K580 Irritable bowel syndrome with diarrhea: Secondary | ICD-10-CM | POA: Insufficient documentation

## 2020-04-30 DIAGNOSIS — Z8249 Family history of ischemic heart disease and other diseases of the circulatory system: Secondary | ICD-10-CM | POA: Diagnosis not present

## 2020-04-30 DIAGNOSIS — K219 Gastro-esophageal reflux disease without esophagitis: Secondary | ICD-10-CM | POA: Insufficient documentation

## 2020-04-30 DIAGNOSIS — D509 Iron deficiency anemia, unspecified: Secondary | ICD-10-CM | POA: Diagnosis not present

## 2020-04-30 DIAGNOSIS — Z79899 Other long term (current) drug therapy: Secondary | ICD-10-CM | POA: Diagnosis not present

## 2020-04-30 DIAGNOSIS — E039 Hypothyroidism, unspecified: Secondary | ICD-10-CM | POA: Insufficient documentation

## 2020-04-30 DIAGNOSIS — F419 Anxiety disorder, unspecified: Secondary | ICD-10-CM | POA: Insufficient documentation

## 2020-04-30 DIAGNOSIS — F329 Major depressive disorder, single episode, unspecified: Secondary | ICD-10-CM | POA: Insufficient documentation

## 2020-04-30 NOTE — Telephone Encounter (Signed)
Scheduled per los. Patient requested dates. ok'd by MD. Andrey Cota printout

## 2020-04-30 NOTE — Progress Notes (Signed)
Reason for the request:    Iron deficiency anemia  HPI: I was asked by Mary Reid PA-C to evaluate [name] for diagnosis of iron deficiency.  She is a 74 year old woman with history of hypothyroidism, arthritis who was found to have iron deficiency dating back to 2010.  Iron studies at that time showed iron level of 36 with ferritin of 7.  Repeat iron studies in 2018 showed an iron level of 28 with ferritin of 4.  Laboratory data obtained on July 2021 showed hemoglobin of 10.7 with MCV 79 and elevated RDW of 16.0.  White cell count and platelet count were all normal with normal differential.  Iron studies on July 16 showed iron of 25 and ferritin of 5.  She has been on oral iron replacement past on multiple occasions although she has been intolerant to them with GI toxicity including dyspepsia and diarrhea.  She did have a symptoms of dysuria with urine analysis on August 16 showed trace blood with positive nitrate and was diagnosed with a UTI.  She denies any hematochezia or hemoptysis.  She is up-to-date on her colonoscopy.  She does not report any headaches, blurry vision, syncope or seizures. Does not report any fevers, chills or sweats.  Does not report any cough, wheezing or hemoptysis.  Does not report any chest pain, palpitation, orthopnea or leg edema.  Does not report any nausea, vomiting or abdominal pain.  Does not report any constipation or diarrhea.  Does not report any skeletal complaints.    Does not report frequency, urgency or hematuria.  Does not report any skin rashes or lesions. Does not report any heat or cold intolerance.  Does not report any lymphadenopathy or petechiae.  Does not report any anxiety or depression.  Remaining review of systems is negative.    Past Medical History:  Diagnosis Date  . Anemia    took Fe- orally 2 yrs. ago   . Anxiety    panic attacks   . Arthritis    shoulders, knees  . Cancer ([place])    skin- preCA  . Depression   . GERD (gastroesophageal  reflux disease)    uses Protonix as needed   . Headache(784.0)   . Humerus fracture    Right   . Hyposmolality and/or hyponatremia 02/27/2013  . Hypothyroidism   . IBS (irritable bowel syndrome)   . Memory loss   . Mental disorder   . Migraine   . Preoperative examination 02/26/2013  . Seizures ([place])    caused by depression , seritonin seizure - effected short term memory    . Shortness of breath   :  Past Surgical History:  Procedure Laterality Date  . ABDOMINAL HYSTERECTOMY    . APPENDECTOMY    . BACK SURGERY  1990's  . BREAST SURGERY  1972   augmentation   . CHOLECYSTECTOMY    . GASTRECTOMY  1994   Due to ulcer, required multiple revisions.   Marland Kitchen HERNIA REPAIR    . ORIF ANKLE FRACTURE Right 2004  . REPLACEMENT TOTAL KNEE    . REVERSE SHOULDER ARTHROPLASTY Right 05/16/2013   Procedure: RIGHT HEMI ARTHROPLASTY  VERSES  REVERSE TOTAL SHOULDER ARTHROPLASTY ;  Surgeon: Augustin Schooling, MD;  Location: [facility];  Service: Orthopedics;  Laterality: Right;  . SPINE SURGERY  2005   Lumbar spinal stenosis   . STOMACH SURGERY     1/3 resection for obstruction  . TONSILLECTOMY    . TUBAL LIGATION    :  Current Outpatient Medications:  .  lactobacillus acidophilus (BACID) TABS tablet, Take 1 tablet by mouth every other day., Disp: , Rfl:  .  levothyroxine (SYNTHROID) 100 MCG tablet, TAKE 1 TABLET BY MOUTH DAILY, Disp: 90 tablet, Rfl: 0 .  nitrofurantoin, macrocrystal-monohydrate, (MACROBID) 100 MG capsule, Take 1 capsule (100 mg total) by mouth 2 (two) times daily., Disp: 10 capsule, Rfl: 0 .  oxyCODONE (ROXICODONE) 15 MG immediate release tablet, Take 1 tablet by mouth. May take up to 5 times daily, Disp: , Rfl:  .  pregabalin (LYRICA) 150 MG capsule, TAKE 1 CAPSULE BY MOUTH THREE TIMES A DAY, Disp: 270 capsule, Rfl: 1 .  ramelteon (ROZEREM) 8 MG tablet, Take 1 tablet by mouth daily., Disp: , Rfl:  .  topiramate (TOPAMAX) 100 MG tablet, Take 1 tablet (100 mg total) by mouth 2 (two)  times daily., Disp: 180 tablet, Rfl: 4 .  traZODone (DESYREL) 50 MG tablet, Take 50-150 mg at bedtime as needed by mouth for sleep (and may take one additional 50 mg tablet daily AS NEEDED for part of a "headache cocktail"). , Disp: , Rfl:  .  Ubrogepant (UBRELVY) 50 MG TABS, Take 50 mg by mouth as needed (may repeat dose in 2 hours, not to exceed 100 mg/day, do not take more than 8 days a month)., Disp: 10 tablet, Rfl: 11 .  venlafaxine (EFFEXOR) 37.5 MG tablet, TAKE 1 TABLET BY MOUTH TWICE DAILY, Disp: 180 tablet, Rfl: 4 .  vortioxetine HBr (TRINTELLIX) 20 MG TABS, Take 20 mg by mouth daily., Disp: , Rfl: :  Allergies  Allergen Reactions  . Bupropion Hcl Other (See Comments)    SEIZURES!!  . Dairy Aid [Lactase] Diarrhea  . Azithromycin Rash  . Erythromycin Rash  :  Family History  Problem Relation Age of Onset  . Aneurysm Mother   . Heart disease Mother   . Cancer Father        pancreatic  . Stroke Father 69  . Hypertension Father   . Alcohol abuse Father   . Heart disease Maternal Grandfather   :  Social History   Socioeconomic History  . Marital status: Married    Spouse name: Not on file  . Number of children: 1  . Years of education: College  . Highest education level: Not on file  Occupational History  . Occupation: Retired  Tobacco Use  . Smoking status: Never Smoker  . Smokeless tobacco: Never Used  Substance and Sexual Activity  . Alcohol use: No    Comment: rare use of white wine   . Drug use: No  . Sexual activity: Not on file  Other Topics Concern  . Not on file  Social History Narrative   Lives at home with her husband.   2 cups caffeine daily.   Right-handed.   Social Determinants of Health   Financial Resource Strain:   . Difficulty of Paying Living Expenses: Not on file  Food Insecurity:   . Worried About Charity fundraiser in the Last Year: Not on file  . Ran Out of Food in the Last Year: Not on file  Transportation Needs:   . Lack of  Transportation (Medical): Not on file  . Lack of Transportation (Non-Medical): Not on file  Physical Activity:   . Days of Exercise per Week: Not on file  . Minutes of Exercise per Session: Not on file  Stress:   . Feeling of Stress : Not on file  Social Connections:   .  Frequency of Communication with Friends and Family: Not on file  . Frequency of Social Gatherings with Friends and Family: Not on file  . Attends Religious Services: Not on file  . Active Member of Clubs or Organizations: Not on file  . Attends Archivist Meetings: Not on file  . Marital Status: Not on file  Intimate Partner Violence:   . Fear of Current or Ex-Partner: Not on file  . Emotionally Abused: Not on file  . Physically Abused: Not on file  . Sexually Abused: Not on file  :  Pertinent items are noted in HPI.  Exam: Blood pressure 129/66, pulse 77, temperature 98.3 F (36.8 C), temperature source Tympanic, resp. rate 18, height 5' 5.25" (1.657 m), weight 213 lb 11.2 oz (96.9 kg), SpO2 95 %.   ECOG 1   General appearance: alert and cooperative appeared without distress. Head: atraumatic without any abnormalities. Eyes: conjunctivae/corneas clear. PERRL.  Sclera anicteric. Throat: lips, mucosa, and tongue normal; without oral thrush or ulcers. Resp: clear to auscultation bilaterally without rhonchi, wheezes or dullness to percussion. Cardio: regular rate and rhythm, S1, S2 normal, no murmur, click, rub or gallop GI: soft, non-tender; bowel sounds normal; no masses,  no organomegaly Skin: Skin color, texture, turgor normal. No rashes or lesions Lymph nodes: Cervical, supraclavicular, and axillary nodes normal. Neurologic: Grossly normal without any motor, sensory or deep tendon reflexes. Musculoskeletal: No joint deformity or effusion.  CBC    Component Value Date/Time   WBC 5.0 03/19/2020 1142   WBC 12.8 (H) 07/12/2017 2257   RBC 4.57 03/19/2020 1142   RBC 5.42 (H) 07/12/2017 2257    HGB 10.7 (L) 03/19/2020 1142   HCT 35.9 03/19/2020 1142   PLT 217 03/19/2020 1142   MCV 79 03/19/2020 1142   MCH 23.4 (L) 03/19/2020 1142   MCH 24.5 (L) 07/12/2017 2257   MCHC 29.8 (L) 03/19/2020 1142   MCHC 31.9 07/12/2017 2257   RDW 16.0 (H) 03/19/2020 1142   LYMPHSABS 1.4 03/19/2020 1142   MONOABS 1.0 07/12/2017 2257   EOSABS 0.2 03/19/2020 1142   BASOSABS 0.1 03/19/2020 1142     Chemistry      Component Value Date/Time   NA 133 (L) 03/19/2020 1142   K 4.1 03/19/2020 1142   CL 101 03/19/2020 1142   CO2 18 (L) 03/19/2020 1142   BUN 13 03/19/2020 1142   CREATININE 0.99 03/19/2020 1142   CREATININE 0.99 (H) 07/02/2015 1228      Component Value Date/Time   CALCIUM 9.0 03/19/2020 1142   ALKPHOS 76 03/19/2020 1142   AST 19 03/19/2020 1142   ALT 9 03/19/2020 1142   BILITOT 0.2 03/19/2020 1142       Assessment and Plan:    74 year old woman with:  1.  Iron deficiency anemia diagnosed at least since 2010 with iron level currently at 25 and ferritin of 5.  Her hemoglobin currently at 10.7 with slight microcytosis and elevated RDW.  The differential diagnosis of her anemia were discussed.  Her iron deficiency is likely related to poor iron absorption without any clear-cut GU or GI malignancy.  Treatment options were discussed at this time.  Oral iron therapy versus intravenous therapy was discussed today in detail.  Complication associated with intravenous iron include arthralgias, myalgias and infusion related complications and rarely access.  Alternatively, trial of oral therapy with iron could be a consideration.  After discussion today, she is agreeable to proceed with intravenous iron.  She will receive Feraheme  infusion on 2 separate occasions for a total of 1020 mg.   2.  Follow-up: In 6 months for repeat evaluation.   45  minutes were dedicated to this visit. The time was spent on reviewing laboratory data,  discussing treatment options, discussing differential  diagnosis and answering questions regarding future plan.     A copy of this consult has been forwarded to the requesting provider

## 2020-05-11 ENCOUNTER — Telehealth: Payer: Self-pay | Admitting: Oncology

## 2020-05-11 NOTE — Telephone Encounter (Signed)
Called pt per 9/7 sch msg - left message for patient to call back to reschedule appt.

## 2020-05-13 NOTE — Progress Notes (Signed)
Cardiology Office Note:   Date:  05/14/2020  NAME:  [name]    MRN: [id number] DOB:  May 30, 1946   PCP:  Lorrene Reid, PA-C  Cardiologist:  No primary care provider on file.   Referring MD: Lorrene Reid, PA-C   Chief Complaint  Patient presents with  . Shortness of Breath   History of Present Illness:   ALAIAH [name] is a 74 y.o. female with a hx of iron deficiency anemia, hypothyroidism who is being seen today for the evaluation of shortness of breath at the request of Lorrene Reid, PA-C. Recently diagnosed with iron deficiency anemia and started on iron infusion.  She was recently evaluated by her primary care physician.  Apparently back in July she had an episode where she woke up in the middle the night with shortness of breath.  She also had what she describes as chest pressure.  She apparently forgot about the episode.  She presents with her son.  There is some memory troubles per his report.  She is had no further episodes.  She reports that she does get short of breath with exertion.  I did review a CT scan from 2018 of her chest which demonstrates pulmonary artery enlargement consistent with pulmonary hypertension.  She reports she does not think she snores and there is no documented sleep apnea.  She is never had congestive heart failure.  She reports she has trouble with balance and falls are an issue.  She apparently stays around the house.  Her son reports that her husband and the patient's father recently had to move into a facility and his mother has declined heavily since then.  She gets no exertional chest pain or pressure she does have this one-time episode of waking in the middle of the night with chest pain and pressure.  Apparently, she did not tell anyone and just mention this in passing to her physician.  I did review her CT scan which shows no evidence of coronary calcium back in 2018.  Her EKG today shows normal sinus rhythm with no acute ischemic change or  evidence of prior infarction.  She is a never smoker.  There is no strong family history of heart disease.  She never had a heart attack or stroke.  Her medical history is significant for hypothyroidism and recently diagnosed iron deficiency anemia.  She also has a noticeable murmur on examination today.  Past Medical History: Past Medical History:  Diagnosis Date  . Anemia    took Fe- orally 2 yrs. ago   . Anxiety    panic attacks   . Arthritis    shoulders, knees  . Cancer ([place])    skin- preCA  . Depression   . GERD (gastroesophageal reflux disease)    uses Protonix as needed   . Headache(784.0)   . Humerus fracture    Right   . Hyposmolality and/or hyponatremia 02/27/2013  . Hypothyroidism   . IBS (irritable bowel syndrome)   . Memory loss   . Mental disorder   . Migraine   . Preoperative examination 02/26/2013  . Seizures ([place])    caused by depression , seritonin seizure - effected short term memory    . Shortness of breath     Past Surgical History: Past Surgical History:  Procedure Laterality Date  . ABDOMINAL HYSTERECTOMY    . APPENDECTOMY    . BACK SURGERY  1990's  . BREAST SURGERY  1972   augmentation   . CHOLECYSTECTOMY    .  GASTRECTOMY  1994   Due to ulcer, required multiple revisions.   Marland Kitchen HERNIA REPAIR    . ORIF ANKLE FRACTURE Right 2004  . REPLACEMENT TOTAL KNEE    . REVERSE SHOULDER ARTHROPLASTY Right 05/16/2013   Procedure: RIGHT HEMI ARTHROPLASTY  VERSES  REVERSE TOTAL SHOULDER ARTHROPLASTY ;  Surgeon: Augustin Schooling, MD;  Location: [facility];  Service: Orthopedics;  Laterality: Right;  . SPINE SURGERY  2005   Lumbar spinal stenosis   . STOMACH SURGERY     1/3 resection for obstruction  . TONSILLECTOMY    . TUBAL LIGATION      Current Medications: Current Meds  Medication Sig  . lactobacillus acidophilus (BACID) TABS tablet Take 1 tablet by mouth every other day.  . levothyroxine (SYNTHROID) 100 MCG tablet TAKE 1 TABLET BY MOUTH DAILY  .  nitrofurantoin, macrocrystal-monohydrate, (MACROBID) 100 MG capsule Take 1 capsule (100 mg total) by mouth 2 (two) times daily.  Marland Kitchen oxyCODONE (ROXICODONE) 15 MG immediate release tablet Take 1 tablet by mouth. May take up to 5 times daily  . pregabalin (LYRICA) 150 MG capsule TAKE 1 CAPSULE BY MOUTH THREE TIMES A DAY  . ramelteon (ROZEREM) 8 MG tablet Take 1 tablet by mouth daily.  Marland Kitchen topiramate (TOPAMAX) 100 MG tablet Take 1 tablet (100 mg total) by mouth 2 (two) times daily.  . traZODone (DESYREL) 50 MG tablet Take 50-150 mg at bedtime as needed by mouth for sleep (and may take one additional 50 mg tablet daily AS NEEDED for part of a "headache cocktail").   . Ubrogepant (UBRELVY) 50 MG TABS Take 50 mg by mouth as needed (may repeat dose in 2 hours, not to exceed 100 mg/day, do not take more than 8 days a month).  . venlafaxine (EFFEXOR) 37.5 MG tablet TAKE 1 TABLET BY MOUTH TWICE DAILY  . vortioxetine HBr (TRINTELLIX) 20 MG TABS Take 20 mg by mouth daily.     Allergies:    Bupropion hcl, Dairy aid [lactase], Azithromycin, and Erythromycin   Social History: Social History   Socioeconomic History  . Marital status: Married    Spouse name: Not on file  . Number of children: 1  . Years of education: College  . Highest education level: Not on file  Occupational History  . Occupation: Retired  Tobacco Use  . Smoking status: Never Smoker  . Smokeless tobacco: Never Used  Substance and Sexual Activity  . Alcohol use: No    Comment: rare use of white wine   . Drug use: No  . Sexual activity: Not on file  Other Topics Concern  . Not on file  Social History Narrative   Lives at home with her husband.   2 cups caffeine daily.   Right-handed.   Social Determinants of Health   Financial Resource Strain:   . Difficulty of Paying Living Expenses: Not on file  Food Insecurity:   . Worried About Charity fundraiser in the Last Year: Not on file  . Ran Out of Food in the Last Year: Not on  file  Transportation Needs:   . Lack of Transportation (Medical): Not on file  . Lack of Transportation (Non-Medical): Not on file  Physical Activity:   . Days of Exercise per Week: Not on file  . Minutes of Exercise per Session: Not on file  Stress:   . Feeling of Stress : Not on file  Social Connections:   . Frequency of Communication with Friends and Family: Not  on file  . Frequency of Social Gatherings with Friends and Family: Not on file  . Attends Religious Services: Not on file  . Active Member of Clubs or Organizations: Not on file  . Attends Archivist Meetings: Not on file  . Marital Status: Not on file     Family History: The patient's family history includes Alcohol abuse in her father; Aneurysm in her mother; Cancer in her father; Heart disease in her maternal grandfather and mother; Hypertension in her father; Stroke (age of onset: 69) in her father.  ROS:   All other ROS reviewed and negative. Pertinent positives noted in the HPI.     EKGs/Labs/Other Studies Reviewed:   The following studies were personally reviewed by me today:  EKG:  EKG is ordered today.  The ekg ordered today demonstrates normal sinus rhythm, heart rate 72, no acute ischemic changes, no evidence of prior infarction, and was personally reviewed by me.   Recent Labs: 03/19/2020: ALT 9; BUN 13; Creatinine, Ser 0.99; Hemoglobin 10.7; Platelets 217; Potassium 4.1; Sodium 133; TSH 2.980   Recent Lipid Panel    Component Value Date/Time   CHOL 179 02/04/2019 1036   TRIG 89 02/04/2019 1036   HDL 83 02/04/2019 1036   CHOLHDL 2.2 02/04/2019 1036   CHOLHDL 3.3 07/02/2015 1228   VLDL 34 (H) 07/02/2015 1228   LDLCALC 78 02/04/2019 1036    Physical Exam:   VS:  BP 140/66   Pulse 72   Ht 5\' 5"  (1.651 m)   Wt 217 lb 9.6 oz (98.7 kg)   SpO2 95%   BMI 36.21 kg/m    Wt Readings from Last 3 Encounters:  05/14/20 217 lb 9.6 oz (98.7 kg)  04/30/20 213 lb 11.2 oz (96.9 kg)  04/19/20 210 lb  (95.3 kg)    General: Well nourished, well developed, in no acute distress Heart: Atraumatic, normal size  Eyes: PEERLA, EOMI  Neck: Supple, no JVD Endocrine: No thryomegaly Cardiac: Normal S1, S2; 3 out of 6 holosystolic murmur Lungs: Clear to auscultation bilaterally, no wheezing, rhonchi or rales  Abd: Soft, nontender, no hepatomegaly  Ext: Trace edema Musculoskeletal: No deformities, BUE and BLE strength normal and equal Skin: Warm and dry, no rashes   Neuro: Alert and oriented to person, place, time, no focal deficits  ASSESSMENT:   MONQUIE FULGHAM is a 74 y.o. female who presents for the following: 1. SOB (shortness of breath) on exertion   2. Chest pain of uncertain etiology   3. Leg edema   4. Murmur   5. Pulmonary hypertension, unspecified ([place])     PLAN:   1. SOB (shortness of breath) on exertion 2. Chest pain of uncertain etiology 3. Leg edema 4. Murmur 5. Pulmonary hypertension, unspecified ([place]) -She presents for a one-time episode of chest pain.  Was awoken from her sleep with crushing chest pain.  EKG is normal today.  I did review a CT scan of her chest which was done in 2018.  She had no evidence of coronary calcium.  She has no major risk factors for CAD other than obesity.  Most recent lipid profile shows a total cholesterol 179, HDL 83, LDL 78 and triglycerides 89.  A1c is 5.5. -She does have a noticeable murmur on examination.  Her CT scan in the past is also demonstrated pulmonary artery enlargement.  I am more concerned for sleep apnea possibly this is the etiology of her chest pain.  I highly doubt this is  related to obstructive CAD.  She is had no further episodes and cannot remember any either.  This is also concerning. -She does need an echocardiogram.  She also has evidence of fluid retention on today's exam.  We will start Lasix 20 mg every other day.  I would also like to obtain a BNP.  Her murmur is suggestive of tricuspid regurgitation.  Possibly she has  worsening pulmonary hypertension.  This could also explain her chest pain.  I will see her back in 2 months after the echo. -Her chest pain is atypical.  EKG is normal.  Given no coronary calcium on recent CT chest I have a low suspicion for obstructive CAD.  We will start with the above work-up.  Disposition: Return in about 2 months (around 07/14/2020).  Medication Adjustments/Labs and Tests Ordered: Current medicines are reviewed at length with the patient today.  Concerns regarding medicines are outlined above.  Orders Placed This Encounter  Procedures  . Brain natriuretic peptide  . EKG 12-Lead  . ECHOCARDIOGRAM COMPLETE   Meds ordered this encounter  Medications  . furosemide (LASIX) 20 MG tablet    Sig: Take 1 tablet (20 mg total) by mouth every other day.    Dispense:  90 tablet    Refill:  3    Patient Instructions  Medication Instructions:  Start Lasix 20 mg every other day   *If you need a refill on your cardiac medications before your next appointment, please call your pharmacy*   Lab Work: BNP today   If you have labs (blood work) drawn today and your tests are completely normal, you will receive your results only by: Marland Kitchen MyChart Message (if you have MyChart) OR . A paper copy in the mail If you have any lab test that is abnormal or we need to change your treatment, we will call you to review the results.   Testing/Procedures: Echocardiogram - Your physician has requested that you have an echocardiogram. Echocardiography is a painless test that uses sound waves to create images of your heart. It provides your doctor with information about the size and shape of your heart and how well your heart's chambers and valves are working. This procedure takes approximately one hour. There are no restrictions for this procedure. This will be performed at our [facility] location - [address], [address].    Follow-Up: At [facility], you and your health needs are our  priority.  As part of our continuing mission to provide you with exceptional heart care, we have created designated Provider Care Teams.  These Care Teams include your primary Cardiologist (physician) and Advanced Practice Providers (APPs -  Physician Assistants and Nurse Practitioners) who all work together to provide you with the care you need, when you need it.  We recommend signing up for the patient portal called "MyChart".  Sign up information is provided on this After Visit Summary.  MyChart is used to connect with patients for Virtual Visits (Telemedicine).  Patients are able to view lab/test results, encounter notes, upcoming appointments, etc.  Non-urgent messages can be sent to your provider as well.   To learn more about what you can do with MyChart, go to [email or web address].    Your next appointment:   2 month(s)  The format for your next appointment:   In Person  Provider:   Eleonore Chiquito, MD      Signed, Addison Naegeli. Audie Box, [place]  [address]  [place], [place] [address] [phone]  05/14/2020 3:52 PM

## 2020-05-14 ENCOUNTER — Encounter: Payer: Self-pay | Admitting: Cardiovascular Disease

## 2020-05-14 ENCOUNTER — Ambulatory Visit (INDEPENDENT_AMBULATORY_CARE_PROVIDER_SITE_OTHER): Payer: Medicare Other | Admitting: Cardiovascular Disease

## 2020-05-14 ENCOUNTER — Other Ambulatory Visit: Payer: Self-pay

## 2020-05-14 VITALS — BP 140/66 | HR 72 | Ht 65.0 in | Wt 217.6 lb

## 2020-05-14 DIAGNOSIS — R011 Cardiac murmur, unspecified: Secondary | ICD-10-CM

## 2020-05-14 DIAGNOSIS — R600 Localized edema: Secondary | ICD-10-CM | POA: Diagnosis not present

## 2020-05-14 DIAGNOSIS — R079 Chest pain, unspecified: Secondary | ICD-10-CM | POA: Diagnosis not present

## 2020-05-14 DIAGNOSIS — R0602 Shortness of breath: Secondary | ICD-10-CM

## 2020-05-14 DIAGNOSIS — I272 Pulmonary hypertension, unspecified: Secondary | ICD-10-CM | POA: Diagnosis not present

## 2020-05-14 MED ORDER — FUROSEMIDE 20 MG PO TABS: 20.0000 mg | ORAL_TABLET | ORAL | 3 refills | Status: DC

## 2020-05-14 NOTE — Patient Instructions (Signed)
Medication Instructions:  Start Lasix 20 mg every other day   *If you need a refill on your cardiac medications before your next appointment, please call your pharmacy*   Lab Work: BNP today   If you have labs (blood work) drawn today and your tests are completely normal, you will receive your results only by: Marland Kitchen MyChart Message (if you have MyChart) OR . A paper copy in the mail If you have any lab test that is abnormal or we need to change your treatment, we will call you to review the results.   Testing/Procedures: Echocardiogram - Your physician has requested that you have an echocardiogram. Echocardiography is a painless test that uses sound waves to create images of your heart. It provides your doctor with information about the size and shape of your heart and how well your heart's chambers and valves are working. This procedure takes approximately one hour. There are no restrictions for this procedure. This will be performed at our [facility] location - [address].    Follow-Up: At [facility], you and your health needs are our priority.  As part of our continuing mission to provide you with exceptional heart care, we have created designated Provider Care Teams.  These Care Teams include your primary Cardiologist (physician) and Advanced Practice Providers (APPs -  Physician Assistants and Nurse Practitioners) who all work together to provide you with the care you need, when you need it.  We recommend signing up for the patient portal called "MyChart".  Sign up information is provided on this After Visit Summary.  MyChart is used to connect with patients for Virtual Visits (Telemedicine).  Patients are able to view lab/test results, encounter notes, upcoming appointments, etc.  Non-urgent messages can be sent to your provider as well.   To learn more about what you can do with MyChart, go to [email or web address].    Your next appointment:   2 month(s)  The  format for your next appointment:   In Person  Provider:   Eleonore Chiquito, MD

## 2020-05-15 LAB — BRAIN NATRIURETIC PEPTIDE: BNP: 305.6 pg/mL — ABNORMAL HIGH (ref 0.0–100.0)

## 2020-05-21 ENCOUNTER — Ambulatory Visit (INDEPENDENT_AMBULATORY_CARE_PROVIDER_SITE_OTHER): Payer: Medicare Other | Admitting: Physician Assistant

## 2020-05-21 ENCOUNTER — Inpatient Hospital Stay: Payer: Medicare Other

## 2020-05-21 ENCOUNTER — Other Ambulatory Visit: Payer: Self-pay

## 2020-05-21 ENCOUNTER — Ambulatory Visit: Payer: Medicare Other

## 2020-05-21 VITALS — Ht 65.25 in | Wt 210.0 lb

## 2020-05-21 DIAGNOSIS — R300 Dysuria: Secondary | ICD-10-CM | POA: Diagnosis not present

## 2020-05-21 NOTE — Progress Notes (Signed)
Rechecking urine to insure treatment worked. AS, CMA  Recently treated for uncomplicated cystitis and rechecking to ensure resolution of infection. Lorrene Reid, PA-C

## 2020-05-26 ENCOUNTER — Telehealth: Payer: Self-pay | Admitting: Physician Assistant

## 2020-05-26 DIAGNOSIS — N3001 Acute cystitis with hematuria: Secondary | ICD-10-CM

## 2020-05-26 LAB — URINE CULTURE

## 2020-05-26 MED ORDER — CIPROFLOXACIN HCL 500 MG PO TABS: 500.0000 mg | ORAL_TABLET | Freq: Every day | ORAL | 0 refills | Status: DC

## 2020-05-26 NOTE — Telephone Encounter (Signed)
Sent rx for Ciprofloxacin 500 mg once daily x 5 days for cystitis. Lorrene Reid, PA-C

## 2020-05-28 ENCOUNTER — Ambulatory Visit: Payer: Medicare Other

## 2020-06-04 ENCOUNTER — Ambulatory Visit (HOSPITAL_COMMUNITY): Payer: Medicare Other | Attending: Cardiology

## 2020-06-04 ENCOUNTER — Other Ambulatory Visit: Payer: Self-pay

## 2020-06-04 DIAGNOSIS — R0602 Shortness of breath: Secondary | ICD-10-CM | POA: Insufficient documentation

## 2020-06-04 LAB — ECHOCARDIOGRAM COMPLETE
Area-P 1/2: 3.3 cm2
P 1/2 time: 534 msec
S' Lateral: 2.3 cm

## 2020-06-22 ENCOUNTER — Telehealth: Payer: Self-pay | Admitting: Physician Assistant

## 2020-06-22 NOTE — Telephone Encounter (Signed)
Patient's son called in requesting to speak with [facility]. His mother is scheduled for Friday and he has a few questions to ask before then as he stated it would be easier to ask them on the phone instead of Friday with his mother here. His name is [name] and his phone number is [phone]. Thanks

## 2020-06-23 ENCOUNTER — Telehealth: Payer: Self-pay | Admitting: Physician Assistant

## 2020-06-23 NOTE — Telephone Encounter (Signed)
Patient's son [name] called requesting to speak with mother's PCP prior to Friday's f/u appt privately about an unspecified concern. He can be reached at [phone].

## 2020-06-23 NOTE — Telephone Encounter (Signed)
Called [name] and discussed cognition concerns.   Thank you, Herb Grays

## 2020-06-23 NOTE — Telephone Encounter (Signed)
Pt's son has some concerns regarding pt's mental status.  Son states that she recently saw cardiologist and he was concerned regarding the pt's mental status as well.  Please call pt's son to discuss prior to pt's appt on Friday.  Charyl Bigger, CMA

## 2020-06-25 ENCOUNTER — Ambulatory Visit (INDEPENDENT_AMBULATORY_CARE_PROVIDER_SITE_OTHER): Payer: Medicare Other | Admitting: Physician Assistant

## 2020-06-25 ENCOUNTER — Inpatient Hospital Stay: Payer: Medicare Other

## 2020-06-25 ENCOUNTER — Encounter: Payer: Self-pay | Admitting: Physician Assistant

## 2020-06-25 ENCOUNTER — Other Ambulatory Visit: Payer: Self-pay

## 2020-06-25 ENCOUNTER — Inpatient Hospital Stay: Payer: Medicare Other | Attending: Hematology and Oncology

## 2020-06-25 VITALS — BP 100/65 | HR 96 | Temp 98.1°F | Ht 65.25 in | Wt 210.4 lb

## 2020-06-25 DIAGNOSIS — I959 Hypotension, unspecified: Secondary | ICD-10-CM | POA: Diagnosis not present

## 2020-06-25 NOTE — Patient Instructions (Signed)
Hypotension As your heart beats, it forces blood through your body. This force is called blood pressure. If you have hypotension, you have low blood pressure. When your blood pressure is too low, you may not get enough blood to your brain or other parts of your body. This may cause you to feel weak, light-headed, have a fast heartbeat, or even pass out (faint). Low blood pressure may be harmless, or it may cause serious problems. What are the causes?  Blood loss.  Not enough water in the body (dehydration).  Heart problems.  Hormone problems.  Pregnancy.  A very bad infection.  Not having enough of certain nutrients.  Very bad allergic reactions.  Certain medicines. What increases the risk?  Age. The risk increases as you get older.  Conditions that affect the heart or the brain and spinal cord (central nervous system).  Taking certain medicines.  Being pregnant. What are the signs or symptoms?  Feeling: ? Weak. ? Light-headed. ? Dizzy. ? Tired (fatigued).  Blurred vision.  Fast heartbeat.  Passing out, in very bad cases. How is this treated?  Changing your diet. This may involve eating more salt (sodium) or drinking more water.  Taking medicines to raise your blood pressure.  Changing how much you take (the dosage) of some of your medicines.  Wearing compression stockings. These stockings help to prevent blood clots and reduce swelling in your legs. In some cases, you may need to go to the hospital for:  Fluid replacement. This means you will receive fluids through an IV tube.  Blood replacement. This means you will receive donated blood through an IV tube (transfusion).  Treating an infection or heart problems, if this applies.  Monitoring. You may need to be monitored while medicines that you are taking wear off. Follow these instructions at home: Eating and drinking   Drink enough fluids to keep your pee (urine) pale yellow.  Eat a healthy diet.  Follow instructions from your doctor about what you can eat or drink. A healthy diet includes: ? Fresh fruits and vegetables. ? Whole grains. ? Low-fat (lean) meats. ? Low-fat dairy products.  Eat extra salt only as told. Do not add extra salt to your diet unless your doctor tells you to.  Eat small meals often.  Avoid standing up quickly after you eat. Medicines  Take over-the-counter and prescription medicines only as told by your doctor. ? Follow instructions from your doctor about changing how much you take of your medicines, if this applies. ? Do not stop or change any of your medicines on your own. General instructions   Wear compression stockings as told by your doctor.  Get up slowly from lying down or sitting.  Avoid hot showers and a lot of heat as told by your doctor.  Return to your normal activities as told by your doctor. Ask what activities are safe for you.  Do not use any products that contain nicotine or tobacco, such as cigarettes, e-cigarettes, and chewing tobacco. If you need help quitting, ask your doctor.  Keep all follow-up visits as told by your doctor. This is important. Contact a doctor if:  You throw up (vomit).  You have watery poop (diarrhea).  You have a fever for more than 2-3 days.  You feel more thirsty than normal.  You feel weak and tired. Get help right away if:  You have chest pain.  You have a fast or uneven heartbeat.  You lose feeling (have numbness) in any   part of your body.  You cannot move your arms or your legs.  You have trouble talking.  You get sweaty or feel light-headed.  You pass out.  You have trouble breathing.  You have trouble staying awake.  You feel mixed up (confused). Summary  Hypotension is also called low blood pressure. It is when the force of blood pumping through your arteries is too weak.  Hypotension may be harmless, or it may cause serious problems.  Treatment may include changing  your diet and medicines, and wearing compression stockings.  In very bad cases, you may need to go to the hospital. This information is not intended to replace advice given to you by your health care provider. Make sure you discuss any questions you have with your health care provider. Document Revised: 02/14/2018 Document Reviewed: 02/14/2018 Elsevier Patient Education  2020 Elsevier Inc.  

## 2020-06-25 NOTE — Progress Notes (Signed)
Established Patient Office Visit  Subjective:  Patient ID: [name], female    DOB: [birth year]  Age: 74 y.o. MRN: [id number]  CC:  Chief Complaint  Patient presents with  . Hyperlipidemia  . Hypothyroidism  . Hypotension    HPI [name] presents for chronic follow-up. During visit patient became very sleepy and unable to stay awake so had to reschedule. Denies cardiac symptoms, dizziness, headache or shortness of breath. Upon further questioning, patient took 2 tablets of her pain medication this morning per patient's sonDorothea [name]. States patient had taken medications before he was able to get to them. Usually she only takes one Oxycodone but because she had a few doctor's appointments today patient decided to go ahead and take two of her pills. Took meds about 2 hours ago.  Patient was in no respiratory distress and vital signs were stable.  No charge visit  Past Medical History:  Diagnosis Date  . Anemia    took Fe- orally 2 yrs. ago   . Anxiety    panic attacks   . Arthritis    shoulders, knees  . Cancer ([place])    skin- preCA  . Depression   . GERD (gastroesophageal reflux disease)    uses Protonix as needed   . Headache(784.0)   . Humerus fracture    Right   . Hyposmolality and/or hyponatremia 02/27/2013  . Hypothyroidism   . IBS (irritable bowel syndrome)   . Memory loss   . Mental disorder   . Migraine   . Preoperative examination 02/26/2013  . 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 Shortness of breath     Past Surgical History:  Procedure Laterality Date  . ABDOMINAL HYSTERECTOMY    . APPENDECTOMY    . BACK SURGERY  1990's  . BREAST SURGERY  1972   augmentation   . CHOLECYSTECTOMY    . GASTRECTOMY  1994   Due to ulcer, required multiple revisions.   Marland Kitchen HERNIA REPAIR    . ORIF ANKLE FRACTURE Right 2004  . REPLACEMENT TOTAL KNEE    . REVERSE SHOULDER ARTHROPLASTY Right 05/16/2013   Procedure:  RIGHT HEMI ARTHROPLASTY  VERSES  REVERSE TOTAL SHOULDER ARTHROPLASTY ;  Surgeon: Augustin Schooling, MD;  Location: [facility];  Service: Orthopedics;  Laterality: Right;  . SPINE SURGERY  2005   Lumbar spinal stenosis   . STOMACH SURGERY     1/3 resection for obstruction  . TONSILLECTOMY    . TUBAL LIGATION      Family History  Problem Relation Age of Onset  . Aneurysm Mother   . Heart disease Mother   . Cancer Father        pancreatic  . Stroke Father 20  . Hypertension Father   . Alcohol abuse Father   . Heart disease Maternal Grandfather     Social History   Socioeconomic History  . Marital status: Married    Spouse name: Not on file  . Number of children: 1  . Years of education: College  . Highest education level: Not on file  Occupational History  . Occupation: Retired  Tobacco Use  . Smoking status: Never Smoker  . Smokeless tobacco: Never Used  Substance and Sexual Activity  . Alcohol use: No    Comment: rare use of white wine   . Drug use: No  . Sexual activity: Not on file  Other Topics  Concern  . Not on file  Social History Narrative   Lives at home with her husband.   2 cups caffeine daily.   Right-handed.   Social Determinants of Health   Financial Resource Strain:   . Difficulty of Paying Living Expenses: Not on file  Food Insecurity:   . Worried About Charity fundraiser in the Last Year: Not on file  . Ran Out of Food in the Last Year: Not on file  Transportation Needs:   . Lack of Transportation (Medical): Not on file  . Lack of Transportation (Non-Medical): Not on file  Physical Activity:   . Days of Exercise per Week: Not on file  . Minutes of Exercise per Session: Not on file  Stress:   . Feeling of Stress : Not on file  Social Connections:   . Frequency of Communication with Friends and Family: Not on file  . Frequency of Social Gatherings with Friends and Family: Not on file  . Attends Religious Services: Not on file  . Active Member of  Clubs or Organizations: Not on file  . Attends Archivist Meetings: Not on file  . Marital Status: Not on file  Intimate Partner Violence:   . Fear of Current or Ex-Partner: Not on file  . Emotionally Abused: Not on file  . Physically Abused: Not on file  . Sexually Abused: Not on file    Outpatient Medications Prior to Visit  Medication Sig Dispense Refill  . furosemide (LASIX) 20 MG tablet Take 1 tablet (20 mg total) by mouth every other day. 90 tablet 3  . lactobacillus acidophilus (BACID) TABS tablet Take 1 tablet by mouth every other day.    . levothyroxine (SYNTHROID) 100 MCG tablet TAKE 1 TABLET BY MOUTH DAILY 90 tablet 0  . oxyCODONE (ROXICODONE) 15 MG immediate release tablet Take 1 tablet by mouth. May take up to 5 times daily    . pregabalin (LYRICA) 150 MG capsule TAKE 1 CAPSULE BY MOUTH THREE TIMES A DAY 270 capsule 1  . ramelteon (ROZEREM) 8 MG tablet Take 1 tablet by mouth daily.    Marland Kitchen topiramate (TOPAMAX) 100 MG tablet Take 1 tablet (100 mg total) by mouth 2 (two) times daily. 180 tablet 4  . traZODone (DESYREL) 50 MG tablet Take 50-150 mg at bedtime as needed by mouth for sleep (and may take one additional 50 mg tablet daily AS NEEDED for part of a "headache cocktail").     . Ubrogepant (UBRELVY) 50 MG TABS Take 50 mg by mouth as needed (may repeat dose in 2 hours, not to exceed 100 mg/day, do not take more than 8 days a month). 10 tablet 11  . venlafaxine (EFFEXOR) 37.5 MG tablet TAKE 1 TABLET BY MOUTH TWICE DAILY 180 tablet 4  . vortioxetine HBr (TRINTELLIX) 20 MG TABS Take 20 mg by mouth daily.    . ciprofloxacin (CIPRO) 500 MG tablet Take 1 tablet (500 mg total) by mouth daily with breakfast. 5 tablet 0   No facility-administered medications prior to visit.    Allergies  Allergen Reactions  . Bupropion Hcl Other (See Comments)    SEIZURES!!  . Dairy Aid [Lactase] Diarrhea  . Azithromycin Rash  . Erythromycin Rash    ROS Review of Systems     Objective:    Physical Exam Cardiovascular:     Rate and Rhythm: Normal rate and regular rhythm.     Pulses: Normal pulses.  Pulmonary:     Breath  sounds: Normal breath sounds.     BP 100/65   Pulse 96   Temp 98.1 F (36.7 C) (Oral)   Ht 5' 5.25" (1.657 m)   Wt 210 lb 6.4 oz (95.4 kg)   SpO2 93% Comment: on RA  BMI 34.74 kg/m  Wt Readings from Last 3 Encounters:  06/25/20 210 lb 6.4 oz (95.4 kg)  05/21/20 210 lb (95.3 kg)  05/14/20 217 lb 9.6 oz (98.7 kg)     Health Maintenance Due  Topic Date Due  . COVID-19 Vaccine (1) Never done  . TETANUS/TDAP  02/02/2013  . INFLUENZA VACCINE  04/04/2020    There are no preventive care reminders to display for this patient.  Lab Results  Component Value Date   TSH 2.980 03/19/2020   Lab Results  Component Value Date   WBC 5.0 03/19/2020   HGB 10.7 (L) 03/19/2020   HCT 35.9 03/19/2020   MCV 79 03/19/2020   PLT 217 03/19/2020   Lab Results  Component Value Date   NA 133 (L) 03/19/2020   K 4.1 03/19/2020   CO2 18 (L) 03/19/2020   GLUCOSE 81 03/19/2020   BUN 13 03/19/2020   CREATININE 0.99 03/19/2020   BILITOT 0.2 03/19/2020   ALKPHOS 76 03/19/2020   AST 19 03/19/2020   ALT 9 03/19/2020   PROT 6.5 03/19/2020   ALBUMIN 4.3 03/19/2020   CALCIUM 9.0 03/19/2020   ANIONGAP 6 07/10/2017   Lab Results  Component Value Date   CHOL 179 02/04/2019   Lab Results  Component Value Date   HDL 83 02/04/2019   Lab Results  Component Value Date   LDLCALC 78 02/04/2019   Lab Results  Component Value Date   TRIG 89 02/04/2019   Lab Results  Component Value Date   CHOLHDL 2.2 02/04/2019   Lab Results  Component Value Date   HGBA1C 5.5 03/19/2020      Assessment & Plan:   Problem List Items Addressed This Visit      Cardiovascular and Mediastinum   Hypotension - Primary      No orders of the defined types were placed in this encounter.   Follow-up: Return for reschedule.    Lorrene Reid, PA-C

## 2020-07-02 ENCOUNTER — Other Ambulatory Visit: Payer: Self-pay

## 2020-07-02 ENCOUNTER — Inpatient Hospital Stay: Payer: Medicare Other

## 2020-07-02 ENCOUNTER — Ambulatory Visit (INDEPENDENT_AMBULATORY_CARE_PROVIDER_SITE_OTHER): Payer: Medicare Other | Admitting: Physician Assistant

## 2020-07-02 ENCOUNTER — Encounter: Payer: Self-pay | Admitting: Physician Assistant

## 2020-07-02 VITALS — BP 122/66 | HR 108 | Ht 65.25 in | Wt 210.0 lb

## 2020-07-02 DIAGNOSIS — I959 Hypotension, unspecified: Secondary | ICD-10-CM

## 2020-07-02 DIAGNOSIS — R413 Other amnesia: Secondary | ICD-10-CM | POA: Diagnosis not present

## 2020-07-02 DIAGNOSIS — Z8673 Personal history of transient ischemic attack (TIA), and cerebral infarction without residual deficits: Secondary | ICD-10-CM

## 2020-07-02 DIAGNOSIS — E039 Hypothyroidism, unspecified: Secondary | ICD-10-CM

## 2020-07-02 DIAGNOSIS — R4189 Other symptoms and signs involving cognitive functions and awareness: Secondary | ICD-10-CM | POA: Diagnosis not present

## 2020-07-02 DIAGNOSIS — F1120 Opioid dependence, uncomplicated: Secondary | ICD-10-CM

## 2020-07-02 DIAGNOSIS — N390 Urinary tract infection, site not specified: Secondary | ICD-10-CM | POA: Diagnosis not present

## 2020-07-02 DIAGNOSIS — F339 Major depressive disorder, recurrent, unspecified: Secondary | ICD-10-CM

## 2020-07-02 LAB — POCT URINALYSIS DIPSTICK
Bilirubin, UA: NEGATIVE
Glucose, UA: NEGATIVE
Ketones, UA: NEGATIVE
Nitrite, UA: POSITIVE
Protein, UA: POSITIVE — AB
Spec Grav, UA: 1.02 (ref 1.010–1.025)
Urobilinogen, UA: 0.2 E.U./dL
pH, UA: 5.5 (ref 5.0–8.0)

## 2020-07-02 NOTE — Addendum Note (Signed)
Addended by: Mickel Crow on: 07/02/2020 12:20 PM   Modules accepted: Orders

## 2020-07-02 NOTE — Assessment & Plan Note (Signed)
-  Followed by pain management, Dr. Nelva Bush. -Discussed with patient taking pain medication as directed and avoid taking more than 1 tablet per dose due to potential side effects.  Patient verbalized understanding.

## 2020-07-02 NOTE — Assessment & Plan Note (Signed)
-  Last TSH WNL -Continue current medication regimen.

## 2020-07-02 NOTE — Patient Instructions (Signed)
Hypotension As your heart beats, it forces blood through your body. This force is called blood pressure. If you have hypotension, you have low blood pressure. When your blood pressure is too low, you may not get enough blood to your brain or other parts of your body. This may cause you to feel weak, light-headed, have a fast heartbeat, or even pass out (faint). Low blood pressure may be harmless, or it may cause serious problems. What are the causes?  Blood loss.  Not enough water in the body (dehydration).  Heart problems.  Hormone problems.  Pregnancy.  A very bad infection.  Not having enough of certain nutrients.  Very bad allergic reactions.  Certain medicines. What increases the risk?  Age. The risk increases as you get older.  Conditions that affect the heart or the brain and spinal cord (central nervous system).  Taking certain medicines.  Being pregnant. What are the signs or symptoms?  Feeling: ? Weak. ? Light-headed. ? Dizzy. ? Tired (fatigued).  Blurred vision.  Fast heartbeat.  Passing out, in very bad cases. How is this treated?  Changing your diet. This may involve eating more salt (sodium) or drinking more water.  Taking medicines to raise your blood pressure.  Changing how much you take (the dosage) of some of your medicines.  Wearing compression stockings. These stockings help to prevent blood clots and reduce swelling in your legs. In some cases, you may need to go to the hospital for:  Fluid replacement. This means you will receive fluids through an IV tube.  Blood replacement. This means you will receive donated blood through an IV tube (transfusion).  Treating an infection or heart problems, if this applies.  Monitoring. You may need to be monitored while medicines that you are taking wear off. Follow these instructions at home: Eating and drinking   Drink enough fluids to keep your pee (urine) pale yellow.  Eat a healthy diet.  Follow instructions from your doctor about what you can eat or drink. A healthy diet includes: ? Fresh fruits and vegetables. ? Whole grains. ? Low-fat (lean) meats. ? Low-fat dairy products.  Eat extra salt only as told. Do not add extra salt to your diet unless your doctor tells you to.  Eat small meals often.  Avoid standing up quickly after you eat. Medicines  Take over-the-counter and prescription medicines only as told by your doctor. ? Follow instructions from your doctor about changing how much you take of your medicines, if this applies. ? Do not stop or change any of your medicines on your own. General instructions   Wear compression stockings as told by your doctor.  Get up slowly from lying down or sitting.  Avoid hot showers and a lot of heat as told by your doctor.  Return to your normal activities as told by your doctor. Ask what activities are safe for you.  Do not use any products that contain nicotine or tobacco, such as cigarettes, e-cigarettes, and chewing tobacco. If you need help quitting, ask your doctor.  Keep all follow-up visits as told by your doctor. This is important. Contact a doctor if:  You throw up (vomit).  You have watery poop (diarrhea).  You have a fever for more than 2-3 days.  You feel more thirsty than normal.  You feel weak and tired. Get help right away if:  You have chest pain.  You have a fast or uneven heartbeat.  You lose feeling (have numbness) in any   part of your body.  You cannot move your arms or your legs.  You have trouble talking.  You get sweaty or feel light-headed.  You pass out.  You have trouble breathing.  You have trouble staying awake.  You feel mixed up (confused). Summary  Hypotension is also called low blood pressure. It is when the force of blood pumping through your arteries is too weak.  Hypotension may be harmless, or it may cause serious problems.  Treatment may include changing  your diet and medicines, and wearing compression stockings.  In very bad cases, you may need to go to the hospital. This information is not intended to replace advice given to you by your health care provider. Make sure you discuss any questions you have with your health care provider. Document Revised: 02/14/2018 Document Reviewed: 02/14/2018 Elsevier Patient Education  2020 Elsevier Inc.  

## 2020-07-02 NOTE — Assessment & Plan Note (Signed)
-  BP stable -Recommend to continue with good hydration. -Will continue to monitor.

## 2020-07-02 NOTE — Progress Notes (Addendum)
Established Patient Office Visit  Subjective:  Patient ID: [name], female    DOB: [birth year]  Age: 74 y.o. MRN: [id number]  CC:  Chief Complaint  Patient presents with  . Hyperlipidemia  . Hypothyroidism  . Hypertension    HPI Mary WITTHUHN presents for follow up on hypothyroidism and hypotension.  Patient is accompanied by her son.  Hypotension: Patient denies dizziness, palpitations or vision changes. Reports good hydration.  Hypothyroidism: Asymptomatic.Taking medication as directed.  Memory: Reports feels like her memory is progressing, becoming more forgetful and is stumbling more. Reports a recent fall.  Denies head trauma.  Patient son states she has done PT in the past and last PT was discontinued since patient plateaued. Patient's son also has similar concerns of patient becoming more forgetful, confused, combative and angry during conversations which tend to fluctuate. Patient states is not fully satisfied with current neurologist and would prefer a second opinion.    Past Medical History:  Diagnosis Date  . Anemia    took Fe- orally 2 yrs. ago   . Anxiety    panic attacks   . Arthritis    shoulders, knees  . Cancer ([place])    skin- preCA  . Depression   . GERD (gastroesophageal reflux disease)    uses Protonix as needed   . Headache(784.0)   . Humerus fracture    Right   . Hyposmolality and/or hyponatremia 02/27/2013  . Hypothyroidism   . IBS (irritable bowel syndrome)   . Memory loss   . Mental disorder   . Migraine   . Preoperative examination 02/26/2013  . Seizures ([place])    caused by depression , seritonin seizure - effected short term memory    . Shortness of breath     Past Surgical History:  Procedure Laterality Date  . ABDOMINAL HYSTERECTOMY    . APPENDECTOMY    . BACK SURGERY  1990's  . BREAST SURGERY  1972   augmentation   . CHOLECYSTECTOMY    . GASTRECTOMY  1994   Due to ulcer, required multiple revisions.   Marland Kitchen HERNIA REPAIR    .  ORIF ANKLE FRACTURE Right 2004  . REPLACEMENT TOTAL KNEE    . REVERSE SHOULDER ARTHROPLASTY Right 05/16/2013   Procedure: RIGHT HEMI ARTHROPLASTY  VERSES  REVERSE TOTAL SHOULDER ARTHROPLASTY ;  Surgeon: Augustin Schooling, MD;  Location: [facility];  Service: Orthopedics;  Laterality: Right;  . SPINE SURGERY  2005   Lumbar spinal stenosis   . STOMACH SURGERY     1/3 resection for obstruction  . TONSILLECTOMY    . TUBAL LIGATION      Family History  Problem Relation Age of Onset  . Aneurysm Mother   . Heart disease Mother   . Cancer Father        pancreatic  . Stroke Father 68  . Hypertension Father   . Alcohol abuse Father   . Heart disease Maternal Grandfather     Social History   Socioeconomic History  . Marital status: Married    Spouse name: Not on file  . Number of children: 1  . Years of education: College  . Highest education level: Not on file  Occupational History  . Occupation: Retired  Tobacco Use  . Smoking status: Never Smoker  . Smokeless tobacco: Never Used  Substance and Sexual Activity  . Alcohol use: No    Comment: rare use of white wine   . Drug use: No  .  Sexual activity: Not on file  Other Topics Concern  . Not on file  Social History Narrative   Lives at home with her husband.   2 cups caffeine daily.   Right-handed.   Social Determinants of Health   Financial Resource Strain:   . Difficulty of Paying Living Expenses: Not on file  Food Insecurity:   . Worried About Charity fundraiser in the Last Year: Not on file  . Ran Out of Food in the Last Year: Not on file  Transportation Needs:   . Lack of Transportation (Medical): Not on file  . Lack of Transportation (Non-Medical): Not on file  Physical Activity:   . Days of Exercise per Week: Not on file  . Minutes of Exercise per Session: Not on file  Stress:   . Feeling of Stress : Not on file  Social Connections:   . Frequency of Communication with Friends and Family: Not on file  . Frequency  of Social Gatherings with Friends and Family: Not on file  . Attends Religious Services: Not on file  . Active Member of Clubs or Organizations: Not on file  . Attends Archivist Meetings: Not on file  . Marital Status: Not on file  Intimate Partner Violence:   . Fear of Current or Ex-Partner: Not on file  . Emotionally Abused: Not on file  . Physically Abused: Not on file  . Sexually Abused: Not on file    Outpatient Medications Prior to Visit  Medication Sig Dispense Refill  . furosemide (LASIX) 20 MG tablet Take 1 tablet (20 mg total) by mouth every other day. 90 tablet 3  . lactobacillus acidophilus (BACID) TABS tablet Take 1 tablet by mouth every other day.    . levothyroxine (SYNTHROID) 100 MCG tablet TAKE 1 TABLET BY MOUTH DAILY 90 tablet 0  . oxyCODONE (ROXICODONE) 15 MG immediate release tablet Take 1 tablet by mouth. May take up to 5 times daily    . pregabalin (LYRICA) 150 MG capsule TAKE 1 CAPSULE BY MOUTH THREE TIMES A DAY 270 capsule 1  . ramelteon (ROZEREM) 8 MG tablet Take 1 tablet by mouth daily.    Marland Kitchen topiramate (TOPAMAX) 100 MG tablet Take 1 tablet (100 mg total) by mouth 2 (two) times daily. 180 tablet 4  . traZODone (DESYREL) 50 MG tablet Take 50-150 mg at bedtime as needed by mouth for sleep (and may take one additional 50 mg tablet daily AS NEEDED for part of a "headache cocktail").     . Ubrogepant (UBRELVY) 50 MG TABS Take 50 mg by mouth as needed (may repeat dose in 2 hours, not to exceed 100 mg/day, do not take more than 8 days a month). 10 tablet 11  . venlafaxine (EFFEXOR) 37.5 MG tablet TAKE 1 TABLET BY MOUTH TWICE DAILY 180 tablet 4  . vortioxetine HBr (TRINTELLIX) 20 MG TABS Take 20 mg by mouth daily.     No facility-administered medications prior to visit.    Allergies  Allergen Reactions  . Bupropion Hcl Other (See Comments)    SEIZURES!!  . Dairy Aid [Lactase] Diarrhea  . Other   . Azithromycin Rash  . Erythromycin Rash    ROS Review  of Systems A fourteen system review of systems was performed and found to be positive as per HPI.   Objective:    Physical Exam General:  Well Developed, in no acute distress, intermittent somnolence  Neuro:  Alert and oriented,  extra-ocular muscles intact  HEENT:  Normocephalic, atraumatic, neck supple Skin:  no gross rash, warm, pink. Cardiac:  RRR, S1 S2, +murmur Respiratory:  ECTA B/L and A/P, Not using accessory muscles, speaking in full sentences- unlabored. Vascular:  Ext warm, no cyanosis apprec.; cap RF less 2 sec. Peripheral edema noted Psych:  No HI/SI, Euthymic mood. Full Affect.   BP 122/66   Pulse (!) 108   Ht 5' 5.25" (1.657 m)   Wt 210 lb (95.3 kg)   SpO2 (!) 87%   BMI 34.68 kg/m  Wt Readings from Last 3 Encounters:  07/02/20 210 lb (95.3 kg)  06/25/20 210 lb 6.4 oz (95.4 kg)  05/21/20 210 lb (95.3 kg)     Health Maintenance Due  Topic Date Due  . COVID-19 Vaccine (1) Never done  . TETANUS/TDAP  02/02/2013  . INFLUENZA VACCINE  04/04/2020    There are no preventive care reminders to display for this patient.  Lab Results  Component Value Date   TSH 2.980 03/19/2020   Lab Results  Component Value Date   WBC 5.0 03/19/2020   HGB 10.7 (L) 03/19/2020   HCT 35.9 03/19/2020   MCV 79 03/19/2020   PLT 217 03/19/2020   Lab Results  Component Value Date   NA 133 (L) 03/19/2020   K 4.1 03/19/2020   CO2 18 (L) 03/19/2020   GLUCOSE 81 03/19/2020   BUN 13 03/19/2020   CREATININE 0.99 03/19/2020   BILITOT 0.2 03/19/2020   ALKPHOS 76 03/19/2020   AST 19 03/19/2020   ALT 9 03/19/2020   PROT 6.5 03/19/2020   ALBUMIN 4.3 03/19/2020   CALCIUM 9.0 03/19/2020   ANIONGAP 6 07/10/2017   Lab Results  Component Value Date   CHOL 179 02/04/2019   Lab Results  Component Value Date   HDL 83 02/04/2019   Lab Results  Component Value Date   LDLCALC 78 02/04/2019   Lab Results  Component Value Date   TRIG 89 02/04/2019   Lab Results  Component  Value Date   CHOLHDL 2.2 02/04/2019   Lab Results  Component Value Date   HGBA1C 5.5 03/19/2020      Assessment & Plan:   Problem List Items Addressed This Visit      Cardiovascular and Mediastinum   Hypotension - Primary    -BP stable -Recommend to continue with good hydration. -Will continue to monitor.         Endocrine   Hypothyroidism    -Last TSH WNL -Continue current medication regimen.        Other   DEPENDENCE, OPIOID, CONTINUOUS    -Followed by pain management, Dr. Nelva Bush. -Discussed with patient taking pain medication as directed and avoid taking more than 1 tablet per dose due to potential side effects.  Patient verbalized understanding.      Memory loss   Relevant Orders   Ambulatory referral to Neurology   Cognitive impairment   Relevant Orders   Ambulatory referral to Neurology    Other Visit Diagnoses    History of CVA (cerebrovascular accident)       Relevant Orders   Ambulatory referral to Neurology   Depression, recurrent ([place])         Recurrent depression: -PHQ-9 score of 4 -Followed by Psychiatry. -Continue current medication regimen.  Memory loss, Cognitive impairment, History of CVA: -Due to concerns of progressive memory loss and behavioral changes will place Neurology referral for second opinion.  -Encourage patient to do brain stimulating activities such as puzzles.  -  Patient has done PT in the past and pending Neurology consultation, could possibly benefit from additional PT. -Noticed confusion during visit today, patient initially reported taking 2 pain pills this morning and then denied taking 2 tablets, stated only took one.  Recurrent UTI: -Patient provided UA sample, recently treated for UTI.  UA positive for nitrites and will send for urine culture. Pending urine culture will start appropriate antibiotic. Currently asymptomatic. -Will place Urology referral for recurrent UTIs.    No orders of the defined types were  placed in this encounter.   Follow-up: Return in about 4 months (around 11/01/2020) for hypotension, pre-dm, memory and FBW .   Note:  This note was prepared with assistance of Dragon voice recognition software. Occasional wrong-word or sound-a-like substitutions may have occurred due to the inherent limitations of voice recognition software.   Lorrene Reid, PA-C

## 2020-07-02 NOTE — Addendum Note (Signed)
Addended by: Mickel Crow on: 07/02/2020 12:21 PM   Modules accepted: Orders

## 2020-07-06 ENCOUNTER — Other Ambulatory Visit: Payer: Self-pay | Admitting: Neurology

## 2020-07-06 ENCOUNTER — Other Ambulatory Visit: Payer: Self-pay | Admitting: Physician Assistant

## 2020-07-06 ENCOUNTER — Telehealth: Payer: Self-pay | Admitting: Physician Assistant

## 2020-07-06 DIAGNOSIS — N3001 Acute cystitis with hematuria: Secondary | ICD-10-CM

## 2020-07-06 LAB — URINE CULTURE

## 2020-07-06 MED ORDER — NITROFURANTOIN MONOHYD MACRO 100 MG PO CAPS: 100.0000 mg | ORAL_CAPSULE | Freq: Two times a day (BID) | ORAL | 0 refills | Status: DC

## 2020-07-06 NOTE — Telephone Encounter (Signed)
Urine culture positive for E. Coli, sent rx for Nitrofurantoin 100 mg BID x 5 days.  Lorrene Reid, PA-C

## 2020-07-06 NOTE — Telephone Encounter (Signed)
Error

## 2020-07-09 ENCOUNTER — Other Ambulatory Visit: Payer: Self-pay | Admitting: Neurology

## 2020-07-09 NOTE — Telephone Encounter (Signed)
Pt request refill pregabalin (LYRICA) 150 MG capsule at [facility]

## 2020-07-12 ENCOUNTER — Other Ambulatory Visit: Payer: Self-pay | Admitting: Physician Assistant

## 2020-07-12 MED ORDER — PREGABALIN 150 MG PO CAPS
ORAL_CAPSULE | ORAL | 1 refills | Status: DC
Start: 2020-07-12 — End: 2020-11-08

## 2020-07-12 NOTE — Telephone Encounter (Signed)
She has a pending follow up on 08/10/20.

## 2020-07-16 ENCOUNTER — Encounter: Payer: Self-pay | Admitting: Neurology

## 2020-07-26 DIAGNOSIS — Z79899 Other long term (current) drug therapy: Secondary | ICD-10-CM | POA: Diagnosis not present

## 2020-07-26 DIAGNOSIS — M545 Low back pain, unspecified: Secondary | ICD-10-CM | POA: Diagnosis not present

## 2020-07-26 DIAGNOSIS — Z79891 Long term (current) use of opiate analgesic: Secondary | ICD-10-CM | POA: Diagnosis not present

## 2020-08-04 NOTE — Progress Notes (Deleted)
Cardiology Office Note:   Date:  08/04/2020  NAME:  [name]    MRN: [id number] DOB:  [birth year]   PCP:  Lorrene Reid, PA-C  Cardiologist:  No primary care provider on file.  Electrophysiologist:  None   Referring MD: Lorrene Reid, PA-C   No chief complaint on file. ***  History of Present Illness:   [name] is a 74 y.o. female with a hx of Obesity, hypothyroidism, HFpEF who presents for follow-up. Seen several months ago for SOB. Elevated BNP and started on lasix.   Problem List 1. IDA 2. Hypothyroidism  3. Obesity 34 4. HFpEF -SOB/elevated BNP  Past Medical History: Past Medical History:  Diagnosis Date  . Anemia    took Fe- orally 2 yrs. ago   . Anxiety    panic attacks   . Arthritis    shoulders, knees  . Cancer ([place])    skin- preCA  . Depression   . GERD (gastroesophageal reflux disease)    uses Protonix as needed   . Headache(784.0)   . Humerus fracture    Right   . Hyposmolality and/or hyponatremia 02/27/2013  . Hypothyroidism   . IBS (irritable bowel syndrome)   . Memory loss   . Mental disorder   . Migraine   . Preoperative examination 02/26/2013  . Seizures ([place])    caused by depression , seritonin seizure - effected short term memory    . Shortness of breath     Past Surgical History: Past Surgical History:  Procedure Laterality Date  . ABDOMINAL HYSTERECTOMY    . APPENDECTOMY    . BACK SURGERY  1990's  . BREAST SURGERY  1972   augmentation   . CHOLECYSTECTOMY    . GASTRECTOMY  1994   Due to ulcer, required multiple revisions.   Marland Kitchen HERNIA REPAIR    . ORIF ANKLE FRACTURE Right 2004  . REPLACEMENT TOTAL KNEE    . REVERSE SHOULDER ARTHROPLASTY Right 05/16/2013   Procedure: RIGHT HEMI ARTHROPLASTY  VERSES  REVERSE TOTAL SHOULDER ARTHROPLASTY ;  Surgeon: Augustin Schooling, MD;  Location: [facility];  Service: Orthopedics;  Laterality: Right;  . SPINE SURGERY  2005   Lumbar spinal stenosis   . STOMACH SURGERY     1/3 resection for  obstruction  . TONSILLECTOMY    . TUBAL LIGATION      Current Medications: No outpatient medications have been marked as taking for the 08/06/20 encounter (Appointment) with O'Neal, Cassie Freer, MD.     Allergies:    Bupropion hcl, Dairy aid [lactase], Other, Azithromycin, and Erythromycin   Social History: Social History   Socioeconomic History  . Marital status: Married    Spouse name: Not on file  . Number of children: 1  . Years of education: College  . Highest education level: Not on file  Occupational History  . Occupation: Retired  Tobacco Use  . Smoking status: Never Smoker  . Smokeless tobacco: Never Used  Substance and Sexual Activity  . Alcohol use: No    Comment: rare use of white wine   . Drug use: No  . Sexual activity: Not on file  Other Topics Concern  . Not on file  Social History Narrative   Lives at home with her husband.   2 cups caffeine daily.   Right-handed.   Social Determinants of Health   Financial Resource Strain:   . Difficulty of Paying Living Expenses: Not on file  Food Insecurity:   .  Worried About Charity fundraiser in the Last Year: Not on file  . Ran Out of Food in the Last Year: Not on file  Transportation Needs:   . Lack of Transportation (Medical): Not on file  . Lack of Transportation (Non-Medical): Not on file  Physical Activity:   . Days of Exercise per Week: Not on file  . Minutes of Exercise per Session: Not on file  Stress:   . Feeling of Stress : Not on file  Social Connections:   . Frequency of Communication with Friends and Family: Not on file  . Frequency of Social Gatherings with Friends and Family: Not on file  . Attends Religious Services: Not on file  . Active Member of Clubs or Organizations: Not on file  . Attends Archivist Meetings: Not on file  . Marital Status: Not on file     Family History: The patient's ***family history includes Alcohol abuse in her father; Aneurysm in her mother;  Cancer in her father; Heart disease in her maternal grandfather and mother; Hypertension in her father; Stroke (age of onset: 1) in her father.  ROS:   All other ROS reviewed and negative. Pertinent positives noted in the HPI.     EKGs/Labs/Other Studies Reviewed:   The following studies were personally reviewed by me today:  EKG:  EKG is *** ordered today.  The ekg ordered today demonstrates ***, and was personally reviewed by me.   TTE 06/04/2020 1. Left ventricular ejection fraction, by estimation, is 60 to 65%. The  left ventricle has normal function. The left ventricle has no regional  wall motion abnormalities. There is mild asymmetric left ventricular  hypertrophy of the basal-septal segment.  Left ventricular diastolic parameters are indeterminate.  2. Right ventricular systolic function is normal. The right ventricular  size is normal. There is normal pulmonary artery systolic pressure.  3. The mitral valve is normal in structure. Trivial mitral valve  regurgitation.  4. The aortic valve is tricuspid. Aortic valve regurgitation is moderate.  No aortic stenosis is present.  5. The inferior vena cava is normal in size with greater than 50%  respiratory variability, suggesting right atrial pressure of 3 mmHg.   Recent Labs: 03/19/2020: ALT 9; BUN 13; Creatinine, Ser 0.99; Hemoglobin 10.7; Platelets 217; Potassium 4.1; Sodium 133; TSH 2.980 05/14/2020: BNP 305.6   Recent Lipid Panel    Component Value Date/Time   CHOL 179 02/04/2019 1036   TRIG 89 02/04/2019 1036   HDL 83 02/04/2019 1036   CHOLHDL 2.2 02/04/2019 1036   CHOLHDL 3.3 07/02/2015 1228   VLDL 34 (H) 07/02/2015 1228   LDLCALC 78 02/04/2019 1036    Physical Exam:   VS:  There were no vitals taken for this visit.   Wt Readings from Last 3 Encounters:  07/02/20 210 lb (95.3 kg)  06/25/20 210 lb 6.4 oz (95.4 kg)  05/21/20 210 lb (95.3 kg)    General: Well nourished, well developed, in no acute  distress Heart: Atraumatic, normal size  Eyes: PEERLA, EOMI  Neck: Supple, no JVD Endocrine: No thryomegaly Cardiac: Normal S1, S2; RRR; no murmurs, rubs, or gallops Lungs: Clear to auscultation bilaterally, no wheezing, rhonchi or rales  Abd: Soft, nontender, no hepatomegaly  Ext: No edema, pulses 2+ Musculoskeletal: No deformities, BUE and BLE strength normal and equal Skin: Warm and dry, no rashes   Neuro: Alert and oriented to person, place, time, and situation, CNII-XII grossly intact, no focal deficits  Psych: Normal  mood and affect   ASSESSMENT:   [name] is a 74 y.o. female who presents for the following: No diagnosis found.  PLAN:   There are no diagnoses linked to this encounter.  Disposition: No follow-ups on file.  Medication Adjustments/Labs and Tests Ordered: Current medicines are reviewed at length with the patient today.  Concerns regarding medicines are outlined above.  No orders of the defined types were placed in this encounter.  No orders of the defined types were placed in this encounter.   There are no Patient Instructions on file for this visit.   Time Spent with Patient: I have spent a total of *** minutes with patient reviewing hospital notes, telemetry, EKGs, labs and examining the patient as well as establishing an assessment and plan that was discussed with the patient.  > 50% of time was spent in direct patient care.  Signed, Addison Naegeli. Audie Box, [facility]  [address] [place], [place] [address] [phone]  08/04/2020 4:06 PM

## 2020-08-06 ENCOUNTER — Telehealth: Payer: Self-pay | Admitting: Neurology

## 2020-08-06 ENCOUNTER — Ambulatory Visit: Payer: Medicare Other | Admitting: Cardiovascular Disease

## 2020-08-06 DIAGNOSIS — Z5181 Encounter for therapeutic drug level monitoring: Secondary | ICD-10-CM | POA: Diagnosis not present

## 2020-08-06 DIAGNOSIS — R600 Localized edema: Secondary | ICD-10-CM

## 2020-08-06 DIAGNOSIS — I5032 Chronic diastolic (congestive) heart failure: Secondary | ICD-10-CM

## 2020-08-06 DIAGNOSIS — Z79899 Other long term (current) drug therapy: Secondary | ICD-10-CM | POA: Diagnosis not present

## 2020-08-06 DIAGNOSIS — R0602 Shortness of breath: Secondary | ICD-10-CM

## 2020-08-06 NOTE — Telephone Encounter (Signed)
..   Pt understands that although there may be some limitations with this type of visit, we will take all precautions to reduce any security or privacy concerns.  Pt understands that this will be treated like an in office visit and we will file with pt's insurance, and there may be a patient responsible charge related to this service. ? ?

## 2020-08-10 ENCOUNTER — Telehealth (INDEPENDENT_AMBULATORY_CARE_PROVIDER_SITE_OTHER): Payer: Medicare Other | Admitting: Neurology

## 2020-08-10 DIAGNOSIS — R413 Other amnesia: Secondary | ICD-10-CM

## 2020-08-10 DIAGNOSIS — G43911 Migraine, unspecified, intractable, with status migrainosus: Secondary | ICD-10-CM | POA: Diagnosis not present

## 2020-08-10 DIAGNOSIS — R269 Unspecified abnormalities of gait and mobility: Secondary | ICD-10-CM

## 2020-08-10 NOTE — Progress Notes (Signed)
HISTORICAL:  [name] is a 74 years old right-handed female, accompanied by her husband [name],  seen in refer by her primary care physician nurse practitioner [name] for evaluation of migraine, memory loss, initial evaluation was on November 06 2016.  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  She reported lifelong history of migraine headaches, her typical migraine are right side severe pounding headache with associated light noise sensitivity, nauseous, lasting for 3 days, she was previously treated by neurologist at [facility] with Lyrica 400 mg daily, tizanidine Frova as needed, oxycodone 20 mg 4 times a day,  Her physician changed at the beginning of the year, she was tapered off oxycodone, since then, she complained of frequent headaches, about once a week severe right side migraine headache, lasting for 3 days, has to go to emergency room,  She has chronic gait abnormality due to right knee pain, she had right knee replacement, left knee severe arthritis disease.B12 level was 1300,  Update December 05 2016: Reviewed laboratory evaluation on November 06 2016,no significant abnormality, CMP, C-reactive protein, folic acid, negative RPR, We personally reviewed MRI of the brain without contrast on November 19 2016: Moderate cortical atrophy, no significant  change compared to previous scan in 2011, chronic bilateral parietal lobe encephalomalacia, chronic hemo-product on the left side, chronic microvascular ischemic changes in the cerebral hemisphere, and in the left cerebellar hemisphere,  She is taking Diclofenac prn for her headaches. She complains almost daily bilateral retro-orbital area pressure pain, lasting for a few hours,  She also complains of increased, gradual worsening gait abnormality, urinary incontinence.  UPDATE Jun 11 2017: She complains of constant headache,  lateralized severe pounding headache, lasting for 2 days, she is now taking frequent almost daily diclofenac without much help, she is also taking Topamax 100 mg every day at nighttime as preventive medication, which did not make much difference,  She has tried BOTOX Injection in the past, without help, she is not a candidate for triptan treatment because evidence of previous stroke, multiple vascular risk factors  MRI of cervical in April 2018 showed evidence of multilevel degenerative changes, most severe at C4-5, C5-6, with evidence of disc bulging, severe bilateral foraminal narrowing  UPDATE Sept 3 2019: Her insurance denies Amovig, she is now taking effexor 37.5mg  bid, which did help her migraine some, she reported mild improvement, she is now having migraine 2 times a week, sleeps helps her migraine, she is now taking oycodone 15mg  up to five times a day, diclofenac as needed.  She still has trouble with her memory. MMSE is 21/30 today.  UPDATE Oct 27 2019: She is accompanied by her [name] at today's visit, her migraine headache is overall doing very well, only has couple migraines each month, Roselyn Meier works well for her migraine,  She was noted to have gradual worsening memory loss, especially since  her husband recent stay at nursing home, she has low back, knee hip pain, on polypharmacy treatment, including oxycodone 15 mg 4 times a day, Lyrica 150 mg 3  times a day, trazodone 50 mg at bedtime, Effexor, Trintellix, and Topamax 100 mg twice a day, she had a history of seizure, has not had recurrent seizure for years  Virtual Visit via Video Aug 10 2020:  I connected with [name] on 08/10/20 at  by Video and verified that I am speaking with the correct person using two identifiers.   I discussed the limitations, risks, security and privacy concerns of performing an evaluation and management service by video and the availability of in person appointments. I also discussed with the patient that there may be a patient responsible charge related to this service. The patient expressed understanding and agreed to proceed.  History of Present Illness: She is with her son at virtual visit, very sleepy, drifting to sleep multiple times, headache overall has much improved, only use Ubrelvy few times since February, responding well, she complains of diffuse body achy pain, multiple joints pain, taking Lyrica, 3 times a day, has slow worsening memory loss, gait abnormality, her husband went to nursing home,   Observations/Objective: I have reviewed problem lists, medications, allergies. Tired looking elderly female, drifting to sleep during conversation, facial symmetric, moving 4 extremities without difficulty  Assessment and Plan: [name] is a 74 y.o. female   Chronic migraine headaches  Overall is under better control, Ubrelvy as needed works well for her  Taper off Topamax 100 mg twice a day to avoid the side effect  Mild cognitive impairment  Slowly progressive, her polypharmacy likely contributed as well  At next visit in 3 months, may consider further decrease medication, options are decreased to lower dose of Lyrica   Gait abnormality  Multifactorial, including deconditioning, aging, multiple joints pain,  Continue home physical therapy  Follow Up Instructions:  3 months with nurse practitioner [name], M.D.  Ph.D.  [name] [address] [place], [place] [address] Ph: [phone] Fax: [phone]  CC: Referring Provider

## 2020-08-25 NOTE — Progress Notes (Deleted)
Cardiology Office Note:   Date:  08/25/2020  NAME:  [name]    MRN: [id number] DOB:  1946/05/09   PCP:  Lorrene Reid, PA-C  Cardiologist:  No primary care provider on file.  Electrophysiologist:  None   Referring MD: Lorrene Reid, PA-C   No chief complaint on file. ***  History of Present Illness:   [name] is a 74 y.o. female with a hx of IDA, dementia, hypothyroidism, HFpEF who presents for follow-up. Seen for SOB. BNP 305. Echo unremarkable. Had SOB and CP that work her up. Suspect sleep apnea. Started on lasix.   Problem List 1. IDA 2. Dementia 3. Hypothyroidism  4. HFpEF -55-60% 5. Pulmonary hypertension? -CTA with pulmonary artery enlargment -normal RVSP on echo -0 coronary calcium  Past Medical History: Past Medical History:  Diagnosis Date  . Anemia    took Fe- orally 2 yrs. ago   . Anxiety    panic attacks   . Arthritis    shoulders, knees  . Cancer ([place])    skin- preCA  . Depression   . GERD (gastroesophageal reflux disease)    uses Protonix as needed   . Headache(784.0)   . Humerus fracture    Right   . Hyposmolality and/or hyponatremia 02/27/2013  . Hypothyroidism   . IBS (irritable bowel syndrome)   . Memory loss   . Mental disorder   . Migraine   . Preoperative examination 02/26/2013  . Seizures ([place])    caused by depression , seritonin seizure - effected short term memory    . Shortness of breath     Past Surgical History: Past Surgical History:  Procedure Laterality Date  . ABDOMINAL HYSTERECTOMY    . APPENDECTOMY    . BACK SURGERY  1990's  . BREAST SURGERY  1972   augmentation   . CHOLECYSTECTOMY    . GASTRECTOMY  1994   Due to ulcer, required multiple revisions.   Marland Kitchen HERNIA REPAIR    . ORIF ANKLE FRACTURE Right 2004  . REPLACEMENT TOTAL KNEE    . REVERSE SHOULDER ARTHROPLASTY Right 05/16/2013   Procedure: RIGHT HEMI ARTHROPLASTY  VERSES  REVERSE TOTAL SHOULDER ARTHROPLASTY ;  Surgeon: Augustin Schooling, MD;   Location: [place];  Service: Orthopedics;  Laterality: Right;  . SPINE SURGERY  2005   Lumbar spinal stenosis   . STOMACH SURGERY     1/3 resection for obstruction  . TONSILLECTOMY    . TUBAL LIGATION      Current Medications: No outpatient medications have been marked as taking for the 08/26/20 encounter (Appointment) with O'Neal, Cassie Freer, MD.     Allergies:    Bupropion hcl, Dairy aid [lactase], Other, Azithromycin, and Erythromycin   Social History: Social History   Socioeconomic History  . Marital status: Married    Spouse name: Not on file  . Number of children: 1  . Years of education: College  . Highest education level: Not on file  Occupational History  . Occupation: Retired  Tobacco Use  . Smoking status: Never Smoker  . Smokeless tobacco: Never Used  Substance and Sexual Activity  . Alcohol use: No    Comment: rare use of white wine   . Drug use: No  . Sexual activity: Not on file  Other Topics Concern  . Not on file  Social History Narrative   Lives at home with her husband.   2 cups caffeine daily.   Right-handed.   Social Determinants of  Health   Financial Resource Strain: Not on file  Food Insecurity: Not on file  Transportation Needs: Not on file  Physical Activity: Not on file  Stress: Not on file  Social Connections: Not on file     Family History: The patient's ***family history includes Alcohol abuse in her father; Aneurysm in her mother; Cancer in her father; Heart disease in her maternal grandfather and mother; Hypertension in her father; Stroke (age of onset: 2) in her father.  ROS:   All other ROS reviewed and negative. Pertinent positives noted in the HPI.     EKGs/Labs/Other Studies Reviewed:   The following studies were personally reviewed by me today:  EKG:  EKG is *** ordered today.  The ekg ordered today demonstrates ***, and was personally reviewed by me.   TTE 06/04/2020 1. Left ventricular ejection fraction, by  estimation, is 60 to 65%. The  left ventricle has normal function. The left ventricle has no regional  wall motion abnormalities. There is mild asymmetric left ventricular  hypertrophy of the basal-septal segment.  Left ventricular diastolic parameters are indeterminate.  2. Right ventricular systolic function is normal. The right ventricular  size is normal. There is normal pulmonary artery systolic pressure.  3. The mitral valve is normal in structure. Trivial mitral valve  regurgitation.  4. The aortic valve is tricuspid. Aortic valve regurgitation is moderate.  No aortic stenosis is present.  5. The inferior vena cava is normal in size with greater than 50%  respiratory variability, suggesting right atrial pressure of 3 mmHg.   Recent Labs: 03/19/2020: ALT 9; BUN 13; Creatinine, Ser 0.99; Hemoglobin 10.7; Platelets 217; Potassium 4.1; Sodium 133; TSH 2.980 05/14/2020: BNP 305.6   Recent Lipid Panel    Component Value Date/Time   CHOL 179 02/04/2019 1036   TRIG 89 02/04/2019 1036   HDL 83 02/04/2019 1036   CHOLHDL 2.2 02/04/2019 1036   CHOLHDL 3.3 07/02/2015 1228   VLDL 34 (H) 07/02/2015 1228   LDLCALC 78 02/04/2019 1036    Physical Exam:   VS:  There were no vitals taken for this visit.   Wt Readings from Last 3 Encounters:  07/02/20 210 lb (95.3 kg)  06/25/20 210 lb 6.4 oz (95.4 kg)  05/21/20 210 lb (95.3 kg)    General: Well nourished, well developed, in no acute distress Head: Atraumatic, normal size  Eyes: PEERLA, EOMI  Neck: Supple, no JVD Endocrine: No thryomegaly Cardiac: Normal S1, S2; RRR; no murmurs, rubs, or gallops Lungs: Clear to auscultation bilaterally, no wheezing, rhonchi or rales  Abd: Soft, nontender, no hepatomegaly  Ext: No edema, pulses 2+ Musculoskeletal: No deformities, BUE and BLE strength normal and equal Skin: Warm and dry, no rashes   Neuro: Alert and oriented to person, place, time, and situation, CNII-XII grossly intact, no focal  deficits  Psych: Normal mood and affect   ASSESSMENT:   [name] is a 74 y.o. female who presents for the following: No diagnosis found.  PLAN:   There are no diagnoses linked to this encounter.  Disposition: No follow-ups on file.  Medication Adjustments/Labs and Tests Ordered: Current medicines are reviewed at length with the patient today.  Concerns regarding medicines are outlined above.  No orders of the defined types were placed in this encounter.  No orders of the defined types were placed in this encounter.   There are no Patient Instructions on file for this visit.   Time Spent with Patient: I have spent a  total of *** minutes with patient reviewing hospital notes, telemetry, EKGs, labs and examining the patient as well as establishing an assessment and plan that was discussed with the patient.  > 50% of time was spent in direct patient care.  Signed, Addison Naegeli. Audie Box, [place]  [address] [place], [place] [address] [phone]  08/25/2020 2:27 PM

## 2020-08-26 ENCOUNTER — Ambulatory Visit: Payer: Medicare Other | Admitting: Cardiovascular Disease

## 2020-09-09 ENCOUNTER — Emergency Department (HOSPITAL_COMMUNITY): Payer: Medicare Other

## 2020-09-09 ENCOUNTER — Emergency Department (HOSPITAL_COMMUNITY)
Admission: EM | Admit: 2020-09-09 | Discharge: 2020-09-10 | Disposition: A | Discharge status: Home / Self Care | Payer: Medicare Other | Attending: Emergency Medicine | Admitting: Emergency Medicine

## 2020-09-09 ENCOUNTER — Other Ambulatory Visit: Payer: Self-pay

## 2020-09-09 DIAGNOSIS — Z79899 Other long term (current) drug therapy: Secondary | ICD-10-CM | POA: Insufficient documentation

## 2020-09-09 DIAGNOSIS — I639 Cerebral infarction, unspecified: Secondary | ICD-10-CM | POA: Diagnosis not present

## 2020-09-09 DIAGNOSIS — N390 Urinary tract infection, site not specified: Secondary | ICD-10-CM | POA: Diagnosis not present

## 2020-09-09 DIAGNOSIS — E039 Hypothyroidism, unspecified: Secondary | ICD-10-CM | POA: Insufficient documentation

## 2020-09-09 DIAGNOSIS — W19XXXA Unspecified fall, initial encounter: Secondary | ICD-10-CM | POA: Diagnosis not present

## 2020-09-09 DIAGNOSIS — M25562 Pain in left knee: Secondary | ICD-10-CM | POA: Diagnosis not present

## 2020-09-09 DIAGNOSIS — J320 Chronic maxillary sinusitis: Secondary | ICD-10-CM | POA: Diagnosis not present

## 2020-09-09 DIAGNOSIS — G9389 Other specified disorders of brain: Secondary | ICD-10-CM | POA: Diagnosis not present

## 2020-09-09 DIAGNOSIS — R0902 Hypoxemia: Secondary | ICD-10-CM | POA: Diagnosis not present

## 2020-09-09 DIAGNOSIS — G319 Degenerative disease of nervous system, unspecified: Secondary | ICD-10-CM | POA: Diagnosis not present

## 2020-09-09 DIAGNOSIS — M19072 Primary osteoarthritis, left ankle and foot: Secondary | ICD-10-CM | POA: Diagnosis not present

## 2020-09-09 DIAGNOSIS — I10 Essential (primary) hypertension: Secondary | ICD-10-CM | POA: Diagnosis not present

## 2020-09-09 DIAGNOSIS — M7989 Other specified soft tissue disorders: Secondary | ICD-10-CM | POA: Diagnosis not present

## 2020-09-09 DIAGNOSIS — Y92002 Bathroom of unspecified non-institutional (private) residence single-family (private) house as the place of occurrence of the external cause: Secondary | ICD-10-CM | POA: Diagnosis not present

## 2020-09-09 DIAGNOSIS — Z85828 Personal history of other malignant neoplasm of skin: Secondary | ICD-10-CM | POA: Insufficient documentation

## 2020-09-09 DIAGNOSIS — R410 Disorientation, unspecified: Secondary | ICD-10-CM | POA: Diagnosis not present

## 2020-09-09 DIAGNOSIS — M7732 Calcaneal spur, left foot: Secondary | ICD-10-CM | POA: Diagnosis not present

## 2020-09-09 LAB — CBC WITH DIFFERENTIAL/PLATELET
Abs Immature Granulocytes: 0.02 10*3/uL (ref 0.00–0.07)
Basophils Absolute: 0.1 10*3/uL (ref 0.0–0.1)
Basophils Relative: 1 %
Eosinophils Absolute: 0.4 10*3/uL (ref 0.0–0.5)
Eosinophils Relative: 5 %
HCT: 38.2 % (ref 36.0–46.0)
Hemoglobin: 11.5 g/dL — ABNORMAL LOW (ref 12.0–15.0)
Immature Granulocytes: 0 %
Lymphocytes Relative: 30 %
Lymphs Abs: 2.1 10*3/uL (ref 0.7–4.0)
MCH: 23.6 pg — ABNORMAL LOW (ref 26.0–34.0)
MCHC: 30.1 g/dL (ref 30.0–36.0)
MCV: 78.3 fL — ABNORMAL LOW (ref 80.0–100.0)
Monocytes Absolute: 0.7 10*3/uL (ref 0.1–1.0)
Monocytes Relative: 9 %
Neutro Abs: 3.9 10*3/uL (ref 1.7–7.7)
Neutrophils Relative %: 55 %
Platelets: 115 10*3/uL — ABNORMAL LOW (ref 150–400)
RBC: 4.88 MIL/uL (ref 3.87–5.11)
RDW: 18.9 % — ABNORMAL HIGH (ref 11.5–15.5)
WBC: 6.8 10*3/uL (ref 4.0–10.5)
nRBC: 0 % (ref 0.0–0.2)

## 2020-09-09 LAB — BASIC METABOLIC PANEL
Anion gap: 12 (ref 5–15)
BUN: 13 mg/dL (ref 8–23)
CO2: 18 mmol/L — ABNORMAL LOW (ref 22–32)
Calcium: 8.4 mg/dL — ABNORMAL LOW (ref 8.9–10.3)
Chloride: 104 mmol/L (ref 98–111)
Creatinine, Ser: 1.02 mg/dL — ABNORMAL HIGH (ref 0.44–1.00)
GFR, Estimated: 57 mL/min — ABNORMAL LOW (ref >60)
Glucose, Bld: 76 mg/dL (ref 70–99)
Potassium: 4.9 mmol/L (ref 3.5–5.1)
Sodium: 134 mmol/L — ABNORMAL LOW (ref 135–145)

## 2020-09-09 NOTE — ED Notes (Signed)
Pt was complaining of being cold.  Turns out her purewick was out of place and the bed was wet. I cleaned the pt, changed the bed and reset the purewick with a brief in place to assist with securement and keeping her dry.  Th pt voids often so I hope to have a sample soon.

## 2020-09-09 NOTE — ED Provider Notes (Signed)
[facility] EMERGENCY DEPARTMENT Provider Note   CSN: [id number] Arrival date & time: 09/09/20  1830     History Chief Complaint  Patient presents with  . [place] is a 75 y.o. female.  The history is provided by the patient, the EMS personnel and medical records.  Millen is a 75 y.o. female who presents to the Emergency Department complaining of fall.  Level V caveat due to confusion.  Hx is provided by EMS and patient. EMS reports that patient lives at home alone and had a fall that was witnessed by her home health aide. According to the home health aide her right knee went out and she fell to the floor. She told EMS she was having pain in her left knee. On presentation to the emergency department she denies any pain at all. She denies any head injury. It is unclear if she lost consciousness. She lives alone but does have home health aides that are present during the day. She baseline ambulates with a walker. EMS reports transient pressure of 90 and sats of 89%. She is not on oxygen at home. She denies any recent illnesses. She denies any fevers, cough, chest pain, Donald pain, nausea, vomiting, dysuria. No known sick contacts. She has been vaccinated for COVID-19.  Attempted to contact patient's son, no answer when called. Additional history available from patient's son after patient's initial ED assessment. He states that when the home health aide found her on the floor in the bathroom she was lethargic, confused and slurring her words.    Past Medical History:  Diagnosis Date  . Anemia    took Fe- orally 2 yrs. ago   . Anxiety    panic attacks   . Arthritis    shoulders, knees  . Cancer ([place])    skin- preCA  . Depression   . GERD (gastroesophageal reflux disease)    uses Protonix as needed   . Headache(784.0)   . Humerus fracture    Right   . Hyposmolality and/or hyponatremia 02/27/2013  . Hypothyroidism   . IBS (irritable  bowel syndrome)   . Memory loss   . Mental disorder   . Migraine   . Preoperative examination 02/26/2013  . Seizures ([place])    caused by depression , seritonin seizure - effected short term memory    . Shortness of breath     Patient Active Problem List   Diagnosis Date Noted  . Long-term current use of opiate analgesic 12/01/2019  . Encounter for Medicare annual wellness exam 12/25/2018  . Chronic back pain 09/05/2018  . 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 History of gastrointestinal hemorrhage 09/05/2018  . Insomnia 09/05/2018  . Injury of left foot 09/05/2018  . Chronic low back pain 05/07/2018  . Cognitive impairment 05/07/2018  . Age related osteoporosis 03/12/2018  . On long term drug therapy 09/07/2017  . Left-sided chest wall pain 07/25/2017  . Hypotension 01/22/2017  . Cerebrovascular accident (CVA) due to thrombosis of precerebral artery ([place]) 12/05/2016  . Neck pain 12/05/2016  . Gait abnormality 12/05/2016  . Paronychia of finger of right hand 08/22/2016  . Osteoarthritis of right knee 11/22/2015  . Right knee pain 10/14/2015  . Actinic keratoses 07/02/2015  . Injury of right hand 04/27/2014  . Chest wall tenderness 04/27/2014  . Foot drop, left 04/14/2014  . Traumatic injury of lower extremity 03/25/2014  . Obesity (BMI 30.0-34.9) 01/30/2014  . Healthcare  maintenance 01/30/2014  . 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  . Diastolic CHF ([place]) 0000000  . Fibromyalgia 08/14/2012  . Numbness in feet 08/08/2012  . GERD with stricture 03/04/2012  . Leg edema 03/04/2012  . CATARACT, LEFT EYE 08/23/2010  . Abnormality of gait 08/23/2010  . Osteoarthritis 02/24/2010  . Iron deficiency anemia 03/06/2009  . 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 SYMPTOM, INCONTINENCE W/O SENSORY AWARENESS 12/26/2006  . Memory loss 12/26/2006  . Hypothyroidism 11/01/2006  . DEPRESSION, MAJOR, RECURRENT 11/01/2006  . PANIC ATTACKS 11/01/2006  . Migraine  headache 11/01/2006  . Carpal tunnel syndrome 11/01/2006  . 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 Irritable bowel syndrome 11/01/2006  . OSTEOPENIA 11/01/2006    Past Surgical History:  Procedure Laterality Date  . ABDOMINAL HYSTERECTOMY    . APPENDECTOMY    . BACK SURGERY  1990's  . BREAST SURGERY  1972   augmentation   . CHOLECYSTECTOMY    . GASTRECTOMY  1994   Due to ulcer, required multiple revisions.   Marland Kitchen HERNIA REPAIR    . ORIF ANKLE FRACTURE Right 2004  . REPLACEMENT TOTAL KNEE    . REVERSE SHOULDER ARTHROPLASTY Right 05/16/2013   Procedure: RIGHT HEMI ARTHROPLASTY  VERSES  REVERSE TOTAL SHOULDER ARTHROPLASTY ;  Surgeon: Augustin Schooling, MD;  Location: [facility];  Service: Orthopedics;  Laterality: Right;  . SPINE SURGERY  2005   Lumbar spinal stenosis   . STOMACH SURGERY     1/3 resection for obstruction  . TONSILLECTOMY    . TUBAL LIGATION       OB History   No obstetric history on file.     Family History  Problem Relation Age of Onset  . Aneurysm Mother   . Heart disease Mother   . Cancer Father        pancreatic  . Stroke Father 4  . Hypertension Father   . Alcohol abuse Father   . Heart disease Maternal Grandfather     Social History   Tobacco Use  . Smoking status: Never Smoker  . Smokeless tobacco: Never Used  Substance Use Topics  . Alcohol use: No    Comment: rare use of white wine   . Drug use: No    Home Medications Prior to Admission medications   Medication Sig Start Date End Date Taking? Authorizing Provider  furosemide (LASIX) 20 MG tablet Take 1 tablet (20 mg total) by mouth every other day. 05/14/20 08/12/20  O'NealCassie Freer, MD  lactobacillus acidophilus (BACID) TABS tablet Take 1 tablet by mouth every other day.    [provider]  levothyroxine (SYNTHROID) 100 MCG tablet TAKE 1 TABLET BY MOUTH DAILY 07/12/20   Abonza, Maritza, PA-C  nitrofurantoin, macrocrystal-monohydrate, (MACROBID) 100 MG capsule Take 1 capsule (100 mg total)  by mouth 2 (two) times daily. 07/06/20   Lorrene Reid, PA-C  oxyCODONE (ROXICODONE) 15 MG immediate release tablet Take 1 tablet by mouth. May take up to 5 times daily 10/10/18   [provider]  pregabalin (LYRICA) 150 MG capsule TAKE 1 CAPSULE BY MOUTH THREE TIMES A DAY 07/12/20   Marcial Pacas, MD  ramelteon (ROZEREM) 8 MG tablet Take 1 tablet by mouth daily. 11/26/18   [provider]  traZODone (DESYREL) 50 MG tablet Take 50-150 mg at bedtime as needed by mouth for sleep (and may take one additional 50 mg tablet daily AS NEEDED for part of a "headache cocktail").     [provider]  Ubrogepant (  UBRELVY) 50 MG TABS Take 50 mg by mouth as needed (may repeat dose in 2 hours, not to exceed 100 mg/day, do not take more than 8 days a month). 01/22/19   Suzzanne Cloud, NP  venlafaxine (EFFEXOR) 37.5 MG tablet TAKE 1 TABLET BY MOUTH TWICE DAILY 07/09/19   Marcial Pacas, MD  vortioxetine HBr (TRINTELLIX) 20 MG TABS Take 20 mg by mouth daily. 07/13/16   [provider]    Allergies    Bupropion hcl, Dairy aid [lactase], Other, Azithromycin, and Erythromycin  Review of Systems   Review of Systems  All other systems reviewed and are negative.   Physical Exam Updated Vital Signs BP (!) 153/71   Pulse 63   Temp 97.6 F (36.4 C) (Oral)   Resp 14   SpO2 94%   Physical Exam Vitals and nursing note reviewed.  Constitutional:      Appearance: She is well-developed and well-nourished.  HENT:     Head: Normocephalic and atraumatic.  Cardiovascular:     Rate and Rhythm: Normal rate and regular rhythm.     Heart sounds: No murmur heard.   Pulmonary:     Effort: Pulmonary effort is normal. No respiratory distress.     Breath sounds: Normal breath sounds.  Abdominal:     Palpations: Abdomen is soft.     Tenderness: There is no abdominal tenderness. There is no guarding or rebound.  Musculoskeletal:        General: No swelling, tenderness or edema.     Comments: No  tenderness over the knees, hips. She is able to range bilateral lower extremities  Skin:    General: Skin is warm and dry.  Neurological:     Mental Status: She is alert.     Comments: Oriented to person place and recent events. Disoriented to year. Moves all extremities symmetrically and strongly.  Psychiatric:        Mood and Affect: Mood and affect normal.        Behavior: Behavior normal.     ED Results / Procedures / Treatments   Labs (all labs ordered are listed, but only abnormal results are displayed) Labs Reviewed  BASIC METABOLIC PANEL - Abnormal; Notable for the following components:      Result Value   Sodium 134 (*)    CO2 18 (*)    Creatinine, Ser 1.02 (*)    Calcium 8.4 (*)    GFR, Estimated 57 (*)    All other components within normal limits  CBC WITH DIFFERENTIAL/PLATELET - Abnormal; Notable for the following components:   Hemoglobin 11.5 (*)    MCV 78.3 (*)    MCH 23.6 (*)    RDW 18.9 (*)    Platelets 115 (*)    All other components within normal limits  URINE CULTURE  URINALYSIS, ROUTINE W REFLEX MICROSCOPIC    EKG EKG Interpretation  Date/Time:  Thursday September 09 2020 18:38:59 EST Ventricular Rate:  61 PR Interval:    QRS Duration: 96 QT Interval:  447 QTC Calculation: 451 R Axis:   34 Text Interpretation: Sinus rhythm Borderline T abnormalities, anterior leads Confirmed by Quintella Reichert [phone]) on 09/09/2020 8:19:34 PM   Radiology DG Chest 2 View  Result Date: 09/09/2020 CLINICAL DATA:  [place], transient hypoxia EXAM: CHEST - 2 VIEW COMPARISON:  07/25/2017 FINDINGS: Frontal and lateral views of the chest demonstrate a stable cardiac silhouette allowing for differences in positioning and technique. No airspace disease, effusion, or pneumothorax. Stable right  shoulder arthroplasty. Calcified bilateral breast prostheses again noted. Chronic compression deformity at the thoracolumbar junction with previous vertebral augmentation. Multiple prior  healed left rib fractures. IMPRESSION: 1. No acute intrathoracic process. Electronically Signed   By: Sharlet Salina M.D.   On: 09/09/2020 19:58   CT Head Wo Contrast  Result Date: 09/09/2020 CLINICAL DATA:  Status post fall. EXAM: CT HEAD WITHOUT CONTRAST TECHNIQUE: Contiguous axial images were obtained from the base of the skull through the vertex without intravenous contrast. COMPARISON:  August 03, 2010 FINDINGS: Brain: There is mild cerebral atrophy with widening of the extra-axial spaces and ventricular dilatation. There are areas of decreased attenuation within the white matter tracts of the supratentorial brain, consistent with microvascular disease changes. Stable chronic bilateral posterior parietal lobe infarcts are seen. Vascular: No hyperdense vessel or unexpected calcification. Skull: Normal. Negative for fracture or focal lesion. Sinuses/Orbits: There is moderate severity left maxillary sinus mucosal thickening. Other: None. IMPRESSION: 1. Generalized cerebral atrophy without acute intracranial abnormality. 2. Chronic bilateral posterior parietal lobe infarcts. 3. Moderate severity left maxillary sinus disease. Electronically Signed   By: Aram Candela M.D.   On: 09/09/2020 20:24    Procedures Procedures (including critical care time)  Medications Ordered in ED Medications - No data to display  ED Course  I have reviewed the triage vital signs and the nursing notes.  Pertinent labs & imaging results that were available during my care of the patient were reviewed by me and considered in my medical decision making (see chart for details).    MDM Rules/Calculators/A&P                         patient here for evaluation of injuries following a fall at home, unclear if there was loss of consciousness. On ED evaluation she is completely asymptomatic and neurologically intact. She did have brief hypoxia for EMS without shortness of breath. Hypoxia resolved on ED presentation. She  does have a history of somnolence and polypharmacy contributing to this. No reports of overdose accidental or intentional. Imaging is negative for acute intracranial abnormality. On multiple reassessments patient is awake, alert and resting comfortably. Presentation is not consistent with CVA/TIA, seizure, sepsis, PE. Patient care transferred pending UA with anticipation for discharge home.  Final Clinical Impression(s) / ED Diagnoses Final diagnoses:  None    Rx / DC Orders ED Discharge Orders    None       Tilden Fossa, MD 09/10/20 0001

## 2020-09-09 NOTE — ED Triage Notes (Signed)
Pt BIB [facility] for witnessed mechanical fall in bathroom with Home health aide present.  Pt states right knee went out. NO LOC, No obvious injuries, no blood thinners.   Home health aide would like to be notiified upon D/C,  They are anticipating a [facility] transport if appropriate and would like to be at house when they arrive.  Home health aide's name is Claira Jeter, [phone]

## 2020-09-09 NOTE — ED Notes (Signed)
Tyler (Son#[phone]) called to check on patient's status.  Thank you

## 2020-09-10 ENCOUNTER — Emergency Department (HOSPITAL_COMMUNITY): Payer: Medicare Other

## 2020-09-10 DIAGNOSIS — M19072 Primary osteoarthritis, left ankle and foot: Secondary | ICD-10-CM | POA: Diagnosis not present

## 2020-09-10 DIAGNOSIS — M7732 Calcaneal spur, left foot: Secondary | ICD-10-CM | POA: Diagnosis not present

## 2020-09-10 DIAGNOSIS — M7989 Other specified soft tissue disorders: Secondary | ICD-10-CM | POA: Diagnosis not present

## 2020-09-10 DIAGNOSIS — M25562 Pain in left knee: Secondary | ICD-10-CM | POA: Diagnosis not present

## 2020-09-10 LAB — URINALYSIS, ROUTINE W REFLEX MICROSCOPIC
Bilirubin Urine: NEGATIVE
Glucose, UA: NEGATIVE mg/dL
Hgb urine dipstick: NEGATIVE
Ketones, ur: NEGATIVE mg/dL
Nitrite: POSITIVE — AB
Protein, ur: NEGATIVE mg/dL
Specific Gravity, Urine: 1.005 (ref 1.005–1.030)
pH: 6 (ref 5.0–8.0)

## 2020-09-10 MED ORDER — CEPHALEXIN 250 MG PO CAPS: 1000.0000 mg | ORAL_CAPSULE | Freq: Once | ORAL | Status: DC

## 2020-09-10 MED ORDER — SODIUM CHLORIDE 0.9 % IV SOLN
1.0000 g | Freq: Once | INTRAVENOUS | Status: AC
  Administered 2020-09-10: 1 g via INTRAVENOUS
  Filled 2020-09-10: qty 10

## 2020-09-10 MED ORDER — CEPHALEXIN 500 MG PO CAPS: 500.0000 mg | ORAL_CAPSULE | Freq: Four times a day (QID) | ORAL | 0 refills | Status: DC

## 2020-09-10 MED ORDER — SODIUM CHLORIDE 0.9 % IV BOLUS
1000.0000 mL | Freq: Once | INTRAVENOUS | Status: AC
  Administered 2020-09-10: 1000 mL via INTRAVENOUS

## 2020-09-10 MED ORDER — ACETAMINOPHEN 500 MG PO TABS
1000.0000 mg | ORAL_TABLET | Freq: Once | ORAL | Status: AC
  Administered 2020-09-10: 1000 mg via ORAL
  Filled 2020-09-10: qty 2

## 2020-09-10 NOTE — ED Notes (Signed)
[name] (Son#[phone])

## 2020-09-10 NOTE — ED Notes (Addendum)
Pt ambulated with a walker without assistance.  Pt complains of pain to the distal end of foot including toes while standing and lying in bed.  Pt also complains of "terrible HA.

## 2020-09-10 NOTE — ED Notes (Signed)
Son, [name] notified of d/c. Son is out of town but will be driving in to get her to go home.

## 2020-09-10 NOTE — ED Notes (Signed)
Patient verbalizes understanding of discharge instructions. Opportunity for questioning and answers were provided. Armband removed by staff, pt discharged from ED via wheelchair to lobby to go home with son.  

## 2020-09-10 NOTE — ED Provider Notes (Signed)
12:54 AM Assumed care from Dr. Ralene Bathe, please see their note for full history, physical and decision making until this point. In brief this is a 75 y.o. year old female who presented to the ED tonight with Fall     Here with a fall.  Unknown syncope.  Apparently his son thinks that the patient might of had slurred speech initially but paramedics stated that was not present on their arrival although the son thought it was.  Patient is asymptomatic at this time.  Planning for urinalysis and ambulation prior to discharge.  On ambulation patient had some left foot pain.  X-ray ordered.  Tylenol ordered.  Urine does appear infected.  Cultures were sent.  Will start Keflex.   Mildly decreased CO2, will give a liter of fluids with keflex. otherwise will be stable for discharge afterwards.   Discharge instructions, including strict return precautions for new or worsening symptoms, given. Patient and/or family verbalized understanding and agreement with the plan as described.   Labs, studies and imaging reviewed by myself and considered in medical decision making if ordered. Imaging interpreted by radiology.  Labs Reviewed  URINALYSIS, ROUTINE W REFLEX MICROSCOPIC - Abnormal; Notable for the following components:      Result Value   APPearance HAZY (*)    Nitrite POSITIVE (*)    Leukocytes,Ua LARGE (*)    Bacteria, UA MANY (*)    All other components within normal limits  BASIC METABOLIC PANEL - Abnormal; Notable for the following components:   Sodium 134 (*)    CO2 18 (*)    Creatinine, Ser 1.02 (*)    Calcium 8.4 (*)    GFR, Estimated 57 (*)    All other components within normal limits  CBC WITH DIFFERENTIAL/PLATELET - Abnormal; Notable for the following components:   Hemoglobin 11.5 (*)    MCV 78.3 (*)    MCH 23.6 (*)    RDW 18.9 (*)    Platelets 115 (*)    All other components within normal limits  URINE CULTURE    CT Head Wo Contrast  Final Result    DG Chest 2 View  Final  Result    DG Foot Complete Left    (Results Pending)    No follow-ups on file.    Merrily Pew, MD 09/11/20 [phone]

## 2020-09-12 LAB — URINE CULTURE: Culture: >=100000 — AB

## 2020-09-22 ENCOUNTER — Other Ambulatory Visit: Payer: Self-pay | Admitting: Neurology

## 2020-10-15 ENCOUNTER — Encounter: Payer: Self-pay | Admitting: Neurology

## 2020-10-15 ENCOUNTER — Ambulatory Visit (INDEPENDENT_AMBULATORY_CARE_PROVIDER_SITE_OTHER): Payer: Medicare Other | Admitting: Neurology

## 2020-10-15 ENCOUNTER — Other Ambulatory Visit: Payer: Self-pay

## 2020-10-15 VITALS — BP 97/61 | HR 102 | Ht 64.0 in | Wt 214.8 lb

## 2020-10-15 DIAGNOSIS — R413 Other amnesia: Secondary | ICD-10-CM

## 2020-10-15 NOTE — Patient Instructions (Signed)
Good to meet you. Schedule Neurocognitive testing. Call for any questions/concerns.   You have been referred for a neuropsychological evaluation (i.e., evaluation of memory and thinking abilities). Please bring someone with you to this appointment if possible, as it is helpful for the doctor to hear from both you and another adult who knows you well. Please bring eyeglasses and hearing aids if you wear them.    The evaluation will take approximately 3 hours and has two parts:   . The first part is a clinical interview with the neuropsychologist (Dr. Melvyn Novas or Dr. [name]). During the interview, the neuropsychologist will speak with you and the individual you brought to the appointment.    . The second part of the evaluation is testing with the doctor's technician Hinton Dyer or Maudie Mercury). During the testing, the technician will ask you to remember different types of material, solve problems, and answer some questionnaires. Your family member will not be present for this portion of the evaluation.   Please note: We must reserve several hours of the neuropsychologist's time and the psychometrician's time for your evaluation appointment. As such, there is a No-Show fee of $100. If you are unable to attend any of your appointments, please contact our office as soon as possible to reschedule.
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
## 2020-10-22 ENCOUNTER — Encounter: Payer: Self-pay | Admitting: Physician Assistant

## 2020-10-22 ENCOUNTER — Other Ambulatory Visit: Payer: Self-pay

## 2020-10-22 ENCOUNTER — Ambulatory Visit (INDEPENDENT_AMBULATORY_CARE_PROVIDER_SITE_OTHER): Payer: Medicare Other | Admitting: Physician Assistant

## 2020-10-22 VITALS — BP 117/63 | HR 80 | Temp 97.3°F | Ht 65.25 in | Wt 215.9 lb

## 2020-10-22 DIAGNOSIS — I959 Hypotension, unspecified: Secondary | ICD-10-CM | POA: Diagnosis not present

## 2020-10-22 DIAGNOSIS — R413 Other amnesia: Secondary | ICD-10-CM | POA: Diagnosis not present

## 2020-10-22 DIAGNOSIS — F339 Major depressive disorder, recurrent, unspecified: Secondary | ICD-10-CM

## 2020-10-22 DIAGNOSIS — F1120 Opioid dependence, uncomplicated: Secondary | ICD-10-CM

## 2020-10-22 DIAGNOSIS — R634 Abnormal weight loss: Secondary | ICD-10-CM | POA: Insufficient documentation

## 2020-10-22 DIAGNOSIS — R110 Nausea: Secondary | ICD-10-CM | POA: Insufficient documentation

## 2020-10-22 NOTE — Progress Notes (Signed)
Established Patient Office Visit  Subjective:  Patient ID: [name], female    DOB: [birth year]  Age: 75 y.o. MRN: [id number]  CC:  Chief Complaint  Patient presents with  . Memory Loss    HPI [name] presents for follow up on memory concerns. Patient is accompanied by her sonDorothea Ogle. Patient saw Neurologist Dr. Krista Blue in December and Topiramate was discontinued. Patient and son have noticed an improvement with memory and cognition. Is able to follow a conversation or show. Patient reports not feeling as groggy and being able to stay awake. Recently evaluated by [facility] and will have formal neurocognitive testing done. Patient was evaluated for a fall and treated for UTI (by ED) last month. Denies recurrent falls, dysuria, fever, chest pain, chills, dizziness, hematuria, or abdominal pain. Continues to try stay hydrated.   Past Medical History:  Diagnosis Date  . Anemia    took Fe- orally 2 yrs. ago   . Anxiety    panic attacks   . Arthritis    shoulders, knees  . Cancer ([place])    skin- preCA  . Depression   . GERD (gastroesophageal reflux disease)    uses Protonix as needed   . Headache(784.0)   . Humerus fracture    Right   . Hyposmolality and/or hyponatremia 02/27/2013  . Hypothyroidism   . IBS (irritable bowel syndrome)   . Memory loss   . Mental disorder   . Migraine   . Preoperative examination 02/26/2013  . Seizures ([place])    caused by depression , seritonin seizure - effected short term memory    . Shortness of breath     Past Surgical History:  Procedure Laterality Date  . ABDOMINAL HYSTERECTOMY    . APPENDECTOMY    . BACK SURGERY  1990's  . BREAST SURGERY  1972   augmentation   . CHOLECYSTECTOMY    . GASTRECTOMY  1994   Due to ulcer, required multiple revisions.   Marland Kitchen HERNIA REPAIR    . ORIF ANKLE FRACTURE Right 2004  . REPLACEMENT TOTAL KNEE    . REVERSE SHOULDER ARTHROPLASTY Right 05/16/2013   Procedure: RIGHT HEMI ARTHROPLASTY  VERSES   REVERSE TOTAL SHOULDER ARTHROPLASTY ;  Surgeon: Augustin Schooling, MD;  Location: [facility];  Service: Orthopedics;  Laterality: Right;  . SPINE SURGERY  2005   Lumbar spinal stenosis   . STOMACH SURGERY     1/3 resection for obstruction  . TONSILLECTOMY    . TUBAL LIGATION      Family History  Problem Relation Age of Onset  . Aneurysm Mother   . Heart disease Mother   . Cancer Father        pancreatic  . Stroke Father 35  . Hypertension Father   . Alcohol abuse Father   . Heart disease Maternal Grandfather     Social History   Socioeconomic History  . Marital status: Married    Spouse name: Not on file  . Number of children: 1  . Years of education: College  . Highest education level: Not on file  Occupational History  . Occupation: Retired  Tobacco Use  . Smoking status: Never Smoker  . Smokeless tobacco: Never Used  Vaping Use  . Vaping Use: Never used  Substance and Sexual Activity  . Alcohol use: Yes    Comment: rare use of white wine   . Drug use: No  . Sexual activity: Not on file  Other Topics Concern  .  Not on file  Social History Narrative   Lives at home with her husband.   2 cups caffeine daily.   Right-handed.   Social Determinants of Health   Financial Resource Strain: Not on file  Food Insecurity: Not on file  Transportation Needs: Not on file  Physical Activity: Not on file  Stress: Not on file  Social Connections: Not on file  Intimate Partner Violence: Not on file    Outpatient Medications Prior to Visit  Medication Sig Dispense Refill  . aspirin 325 MG tablet Take 325 mg by mouth daily.    Marland Kitchen levothyroxine (SYNTHROID) 100 MCG tablet TAKE 1 TABLET BY MOUTH DAILY 90 tablet 1  . oxyCODONE (ROXICODONE) 15 MG immediate release tablet Take 1 tablet by mouth. May take up to 5 times daily    . pregabalin (LYRICA) 150 MG capsule TAKE 1 CAPSULE BY MOUTH THREE TIMES A DAY 270 capsule 1  . ramelteon (ROZEREM) 8 MG tablet Take 1 tablet by mouth daily.     . traZODone (DESYREL) 50 MG tablet Take 50-150 mg at bedtime as needed by mouth for sleep (and may take one additional 50 mg tablet daily AS NEEDED for part of a "headache cocktail").     . Ubrogepant (UBRELVY) 50 MG TABS Take 50 mg by mouth as needed (may repeat dose in 2 hours, not to exceed 100 mg/day, do not take more than 8 days a month). 10 tablet 11  . venlafaxine (EFFEXOR) 37.5 MG tablet TAKE 1 TABLET BY MOUTH TWICE DAILY 180 tablet 0  . vortioxetine HBr (TRINTELLIX) 20 MG TABS tablet Take 20 mg by mouth daily.     No facility-administered medications prior to visit.    Allergies  Allergen Reactions  . Bupropion Hcl Other (See Comments)    SEIZURES!!  . Dairy Aid [Lactase] Diarrhea  . Other   . Azithromycin Rash  . Erythromycin Rash    ROS Review of Systems A fourteen system review of systems was performed and found to be positive as per HPI.  Objective:    Physical Exam General:  Well Developed, well nourished, appropriate for stated age.  Neuro:  Alert and oriented,  extra-ocular muscles intact  HEENT:  Normocephalic, atraumatic, neck supple, no carotid bruits appreciated  Skin:  no gross rash, warm, pink. Cardiac:  RRR, S1 S2, +murmur  Respiratory:  ECTA B/L, Not using accessory muscles, speaking in full sentences- unlabored. Vascular:  Ext warm, no cyanosis apprec.;  Trace edema, L>R Psych:  No HI/SI, judgement and insight stable, Euthymic mood. Full Affect.   BP 117/63   Pulse 80   Temp (!) 97.3 F (36.3 C)   Ht 5' 5.25" (1.657 m)   Wt 215 lb 14.4 oz (97.9 kg)   SpO2 90%   BMI 35.65 kg/m  Wt Readings from Last 3 Encounters:  10/22/20 215 lb 14.4 oz (97.9 kg)  10/15/20 214 lb 12.8 oz (97.4 kg)  07/02/20 210 lb (95.3 kg)     Health Maintenance Due  Topic Date Due  . COVID-19 Vaccine (1) Never done  . TETANUS/TDAP  02/02/2013  . INFLUENZA VACCINE  04/04/2020    There are no preventive care reminders to display for this patient.  Lab Results   Component Value Date   TSH 2.980 03/19/2020   Lab Results  Component Value Date   WBC 6.8 09/09/2020   HGB 11.5 (L) 09/09/2020   HCT 38.2 09/09/2020   MCV 78.3 (L) 09/09/2020   PLT 115 (  L) 09/09/2020   Lab Results  Component Value Date   NA 134 (L) 09/09/2020   K 4.9 09/09/2020   CO2 18 (L) 09/09/2020   GLUCOSE 76 09/09/2020   BUN 13 09/09/2020   CREATININE 1.02 (H) 09/09/2020   BILITOT 0.2 03/19/2020   ALKPHOS 76 03/19/2020   AST 19 03/19/2020   ALT 9 03/19/2020   PROT 6.5 03/19/2020   ALBUMIN 4.3 03/19/2020   CALCIUM 8.4 (L) 09/09/2020   ANIONGAP 12 09/09/2020   Lab Results  Component Value Date   CHOL 179 02/04/2019   Lab Results  Component Value Date   HDL 83 02/04/2019   Lab Results  Component Value Date   LDLCALC 78 02/04/2019   Lab Results  Component Value Date   TRIG 89 02/04/2019   Lab Results  Component Value Date   CHOLHDL 2.2 02/04/2019   Lab Results  Component Value Date   HGBA1C 5.5 03/19/2020      Assessment & Plan:   Problem List Items Addressed This Visit      Cardiovascular and Mediastinum   Hypotension   Relevant Medications   aspirin 325 MG tablet     Other   DEPENDENCE, OPIOID, CONTINUOUS   Memory loss - Primary    Other Visit Diagnoses    Depression, recurrent ([place])         Memory loss: -Some improvement with discontinuation of topiramate.  -Recommend to continue with neurocognitive testing. -Reviewed recent head CT WO contrast: FINDINGS:  Brain: There is mild cerebral atrophy with widening of the  extra-axial spaces and ventricular dilatation.  There are areas of decreased attenuation within the white matter  tracts of the supratentorial brain, consistent with microvascular  disease changes. -Continue to follow up with Neurology.  Hypotension: -BP stable and wnl. -Recommend to continue to stay hydrated. -Will continue to monitor.  Depression, recurrent: -Followed by Psychiatry. -PHQ-9 score of 6, PHQ-2  score of 0. -Continue current medication regimen.  Dependence, opioid, continuous: -Followed by pain management.  No orders of the defined types were placed in this encounter.   Follow-up: Return in about 4 months (around 02/19/2021) for [place] and [place] .   Note:  This note was prepared with assistance of Dragon voice recognition software. Occasional wrong-word or sound-a-like substitutions may have occurred due to the inherent limitations of voice recognition software.  Lorrene Reid, PA-C

## 2020-10-22 NOTE — Patient Instructions (Signed)

## 2020-11-08 ENCOUNTER — Telehealth: Payer: Self-pay | Admitting: Physician Assistant

## 2020-11-08 ENCOUNTER — Encounter: Payer: Self-pay | Admitting: Neurology

## 2020-11-08 ENCOUNTER — Ambulatory Visit (INDEPENDENT_AMBULATORY_CARE_PROVIDER_SITE_OTHER): Payer: Medicare Other | Admitting: Neurology

## 2020-11-08 ENCOUNTER — Other Ambulatory Visit: Payer: Self-pay

## 2020-11-08 VITALS — BP 105/63 | HR 81 | Ht 65.0 in | Wt 221.0 lb

## 2020-11-08 DIAGNOSIS — R4189 Other symptoms and signs involving cognitive functions and awareness: Secondary | ICD-10-CM | POA: Diagnosis not present

## 2020-11-08 DIAGNOSIS — G43911 Migraine, unspecified, intractable, with status migrainosus: Secondary | ICD-10-CM | POA: Diagnosis not present

## 2020-11-08 MED ORDER — UBRELVY 50 MG PO TABS: 50.0000 mg | ORAL_TABLET | ORAL | 11 refills | Status: DC | PRN

## 2020-11-08 MED ORDER — VENLAFAXINE HCL 37.5 MG PO TABS: 37.5000 mg | ORAL_TABLET | Freq: Two times a day (BID) | ORAL | 1 refills | Status: DC

## 2020-11-08 MED ORDER — PREGABALIN 150 MG PO CAPS: ORAL_CAPSULE | ORAL | 0 refills | Status: DC

## 2020-11-08 NOTE — Telephone Encounter (Signed)
Patient scheduled.

## 2020-11-08 NOTE — Telephone Encounter (Signed)
Pt will need an appointment for evaluation of the rash. Please contact patient to schedule accordingly. Can see Nira Conn if no appointment available for [facility].

## 2020-11-08 NOTE — Patient Instructions (Signed)
Drop back the Lyrica 150 mg twice daily Keep other medications See your pcp about rash Continue with memory work up from Dr. Delice Lesch See you back in 6-8 months

## 2020-11-08 NOTE — Telephone Encounter (Signed)
Patient's son called in stating his mother has a rash primarily on her arms, hands, and forearms, and upper arms. It is worse on her forearms. Patient says it is very itchy. Patient has been scratching at it and there are some scabs from the scratching. Patient has been using neosporin for the rash. Thanks

## 2020-11-08 NOTE — Progress Notes (Signed)
PATIENT: [name] DOB: 05-04-1946  REASON FOR VISIT: follow up HISTORY FROM: patient  HISTORY OF PRESENT ILLNESS: Today 11/08/20  HISTORY  [name] a 75 years old right-handed female, accompanied by her husband [name], seen in refer by her primary care physician nurse practitioner [name] for evaluation of migraine, memory loss, initial evaluation was onMarch 5 2018.  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  She reported lifelong history of migraine headaches, her typical migraine are right side severe pounding headache with associated light noise sensitivity, nauseous, lasting for 3 days, she was previously treated by neurologist at [facility] with Lyrica 400 mg daily, tizanidine Frova as needed, oxycodone 20 mg 4 times a day,  Her physician changed at the beginning of the year, she was tapered off oxycodone, since then, she complained of frequent headaches, about once a week severe right side migraine headache, lasting for 3 days, has to go to emergency room,  She has chronic gait abnormality due to right knee pain, she had right knee replacement, left knee severe arthritis disease.B12 level was 1300,  Update December 05 2016: Reviewed laboratory evaluation on November 06 2016,no significant abnormality,CMP, C-reactive protein,  folic acid, negative RPR, We personally reviewed MRI of the brain without contrast on November 19 2016: Moderate cortical atrophy, no significant change compared to previous scan in 2011, chronic bilateral parietal lobe encephalomalacia, chronic hemo-product on the left side, chronic microvascular ischemic changes in the cerebral hemisphere, and in the left cerebellar hemisphere,  She is taking Diclofenac prn for her headaches. She complains almost daily bilateral retro-orbital area pressure pain, lasting for a few hours,  She also complains of increased, gradual worsening gait abnormality, urinary incontinence.  UPDATE Jun 11 2017: She complains of constant headache, lateralized severe pounding headache, lasting for 2 days, she is now taking frequent almost daily diclofenac without much help, she is also taking Topamax 100 mg every day at nighttime as preventive medication, which did not make much difference,  She has tried BOTOX Injection in the past, without help, she is not a candidate for triptan treatment because evidence of previous stroke, multiple vascular risk factors  MRI of cervical in April 2018 showed evidence of multilevel degenerative changes, most severe at C4-5, C5-6, with evidence of disc bulging, severe bilateral foraminal narrowing  UPDATE Sept 3 2019: Her insurance denies Amovig, she is now taking effexor 37.5mg  bid, which did help her migraine some, she reported mild improvement, she is now having migraine 2 times a week, sleeps helps her migraine, she is now taking oycodone 15mg  up to five times a day, diclofenac as needed.  She still has trouble with her memory. MMSE is 21/30 today.  UPDATE Oct 27 2019: She is accompanied by her [name] at today's visit, her migraine headache is overall doing very well, only has couple migraines each month, [name] works well  for her migraine,  She was noted to have gradual worsening memory loss, especially since her husband  recent stay at nursing home, she has low back, knee hip pain, on polypharmacy treatment, including oxycodone 15 mg 4 times a day, Lyrica 150 mg 3 times a day, trazodone 50 mg at bedtime, Effexor, Trintellix, and Topamax 100 mg twice a day, she had a history of seizure, has not had recurrent seizure for years  Virtual Visit via Video Aug 10 2020:  I connected with [name] on 08/10/20 at  by Video and verified that I am speaking with the correct person using two identifiers.  I discussed the limitations, risks, security and privacy concerns of performing an evaluation and management service by video and the availability of in person appointments. I also discussed with the patient that there may be a patient responsible charge related to this service. The patient expressed understanding and agreed to proceed.  08/10/2020 MaryYan History of Present Illness: She is with her son at virtual visit, very sleepy, drifting to sleep multiple times, headache overall has much improved, only use Ubrelvy few times since February, responding well, she complains of diffuse body achy pain, multiple joints pain, taking Lyrica, 3 times a day, has slow worsening memory loss, gait abnormality, her husband went to nursing home  Update November 08, 2020 SS: Saw [name] for 2nd opinion of memory, was referred for neuropsychological evaluation, is pending.  Here with her son, [name]. Has rash to forearms, mostly right forearm. Is itchy. Seeing PCP.   Her son has been more involved in last 18 months. Trouble focusing, her mind wanders. Is off Topamax completely, thinks big improvement in memory and focus. Before had a lot of repetitive questioning, still present but is better. No seizures.   Remains on Lyrica 150 mg 3 times daily. Migraines are well controlled, are very rare, 2-3 times a year. For acute headache will take Ubrelvy, excellent benefit. She has caregiver during day.    Staying awake much better now. Using  walker. No falls. Takes oxycodone from [name] for chronic neck and back pain.   REVIEW OF SYSTEMS: Out of a complete 14 system review of symptoms, the patient complains only of the following symptoms, and all other reviewed systems are negative.  See HPI  ALLERGIES: Allergies  Allergen Reactions  . Bupropion Hcl Other (See Comments)    SEIZURES!!  . Dairy Aid [Lactase] Diarrhea  . Other   . Azithromycin Rash  . Erythromycin Rash    HOME MEDICATIONS: Outpatient Medications Prior to Visit  Medication Sig Dispense Refill  . aspirin 325 MG tablet Take 325 mg by mouth daily.    Marland Kitchen levothyroxine (SYNTHROID) 100 MCG tablet TAKE 1 TABLET BY MOUTH DAILY 90 tablet 1  . oxyCODONE (ROXICODONE) 15 MG immediate release tablet Take 1 tablet by mouth. May take up to 5 times daily    . ramelteon (ROZEREM) 8 MG tablet Take 1 tablet by mouth daily.    . traZODone (DESYREL) 50 MG tablet Take 50-150 mg at bedtime as needed by mouth for sleep (and may take one additional 50 mg tablet daily AS NEEDED for part of a "headache cocktail").     . vortioxetine HBr (TRINTELLIX) 20 MG TABS tablet Take 20 mg by mouth daily.    . pregabalin (LYRICA) 150 MG capsule TAKE 1 CAPSULE BY MOUTH THREE TIMES A DAY 270 capsule 1  . Ubrogepant (UBRELVY) 50 MG TABS Take 50 mg by mouth  as needed (may repeat dose in 2 hours, not to exceed 100 mg/day, do not take more than 8 days a month). 10 tablet 11  . venlafaxine (EFFEXOR) 37.5 MG tablet TAKE 1 TABLET BY MOUTH TWICE DAILY 180 tablet 0   No facility-administered medications prior to visit.    PAST MEDICAL HISTORY: Past Medical History:  Diagnosis Date  . Anemia    took Fe- orally 2 yrs. ago   . Anxiety    panic attacks   . Arthritis    shoulders, knees  . Cancer ([place])    skin- preCA  . Depression   . GERD (gastroesophageal reflux disease)    uses Protonix as needed   . Headache(784.0)   . Humerus fracture    Right   . Hyposmolality and/or hyponatremia  02/27/2013  . Hypothyroidism   . IBS (irritable bowel syndrome)   . Memory loss   . Mental disorder   . Migraine   . Preoperative examination 02/26/2013  . Seizures ([place])    caused by depression , seritonin seizure - effected short term memory    . Shortness of breath     PAST SURGICAL HISTORY: Past Surgical History:  Procedure Laterality Date  . ABDOMINAL HYSTERECTOMY    . APPENDECTOMY    . BACK SURGERY  1990's  . BREAST SURGERY  1972   augmentation   . CHOLECYSTECTOMY    . GASTRECTOMY  1994   Due to ulcer, required multiple revisions.   Marland Kitchen HERNIA REPAIR    . ORIF ANKLE FRACTURE Right 2004  . REPLACEMENT TOTAL KNEE    . REVERSE SHOULDER ARTHROPLASTY Right 05/16/2013   Procedure: RIGHT HEMI ARTHROPLASTY  VERSES  REVERSE TOTAL SHOULDER ARTHROPLASTY ;  Surgeon: Augustin Schooling, MD;  Location: [facility];  Service: Orthopedics;  Laterality: Right;  . SPINE SURGERY  2005   Lumbar spinal stenosis   . STOMACH SURGERY     1/3 resection for obstruction  . TONSILLECTOMY    . TUBAL LIGATION      FAMILY HISTORY: Family History  Problem Relation Age of Onset  . Aneurysm Mother   . Heart disease Mother   . Cancer Father        pancreatic  . Stroke Father 80  . Hypertension Father   . Alcohol abuse Father   . Heart disease Maternal Grandfather     SOCIAL HISTORY: Social History   Socioeconomic History  . Marital status: Married    Spouse name: Not on file  . Number of children: 1  . Years of education: College  . Highest education level: Not on file  Occupational History  . Occupation: Retired  Tobacco Use  . Smoking status: Never Smoker  . Smokeless tobacco: Never Used  Vaping Use  . Vaping Use: Never used  Substance and Sexual Activity  . Alcohol use: Yes    Comment: rare use of white wine   . Drug use: No  . Sexual activity: Not on file  Other Topics Concern  . Not on file  Social History Narrative   Lives at home with her husband.   2 cups caffeine daily.    Right-handed.   Social Determinants of Health   Financial Resource Strain: Not on file  Food Insecurity: Not on file  Transportation Needs: Not on file  Physical Activity: Not on file  Stress: Not on file  Social Connections: Not on file  Intimate Partner Violence: Not on file   PHYSICAL EXAM  Vitals:  11/08/20 1351  BP: 105/63  Pulse: 81  Weight: 221 lb (100.2 kg)  Height: 5\' 5"  (1.651 m)   Body mass index is 36.78 kg/m.  Generalized: Well developed, in no acute distress  Neurological examination  Mentation: Alert oriented to time, place, history taking. Follows all commands speech and language fluent, stays awake entire time, interactive Cranial nerve II-XII: Pupils were equal round reactive to light. Extraocular movements were full, visual field were full on confrontational test. Facial sensation and strength were normal. Head turning and shoulder shrug were normal and symmetric. Motor: The motor testing reveals 5 over 5 strength of all 4 extremities. Good symmetric motor tone is noted throughout.  Sensory: Sensory testing is intact to soft touch on all 4 extremities. No evidence of extinction is noted.  Coordination: Cerebellar testing reveals good finger-nose-finger and heel-to-shin bilaterally.  Gait and station: Has to push off from seated position to stand, gait is wide-based, antalgic, unsteady independent, steady with walker Reflexes: Deep tendon reflexes are 1+  DIAGNOSTIC DATA (LABS, IMAGING, TESTING) - I reviewed patient records, labs, notes, testing and imaging myself where available.  Lab Results  Component Value Date   WBC 6.8 09/09/2020   HGB 11.5 (L) 09/09/2020   HCT 38.2 09/09/2020   MCV 78.3 (L) 09/09/2020   PLT 115 (L) 09/09/2020      Component Value Date/Time   NA 134 (L) 09/09/2020 2139   NA 133 (L) 03/19/2020 1142   K 4.9 09/09/2020 2139   CL 104 09/09/2020 2139   CO2 18 (L) 09/09/2020 2139   GLUCOSE 76 09/09/2020 2139   BUN 13 09/09/2020  2139   BUN 13 03/19/2020 1142   CREATININE 1.02 (H) 09/09/2020 2139   CREATININE 0.99 (H) 07/02/2015 1228   CALCIUM 8.4 (L) 09/09/2020 2139   PROT 6.5 03/19/2020 1142   ALBUMIN 4.3 03/19/2020 1142   AST 19 03/19/2020 1142   ALT 9 03/19/2020 1142   ALKPHOS 76 03/19/2020 1142   BILITOT 0.2 03/19/2020 1142   GFRNONAA 57 (L) 09/09/2020 2139   GFRAA 64 03/19/2020 1142   Lab Results  Component Value Date   CHOL 179 02/04/2019   HDL 83 02/04/2019   LDLCALC 78 02/04/2019   TRIG 89 02/04/2019   CHOLHDL 2.2 02/04/2019   Lab Results  Component Value Date   HGBA1C 5.5 03/19/2020   Lab Results  Component Value Date   VITAMINB12 1,302 (H) 11/06/2016   Lab Results  Component Value Date   TSH 2.980 03/19/2020   ASSESSMENT AND PLAN 75 y.o. year old female  has a past medical history of Anemia, Anxiety, Arthritis, Cancer ([place]), Depression, GERD (gastroesophageal reflux disease), Headache(784.0), Humerus fracture, Hyposmolality and/or hyponatremia (02/27/2013), Hypothyroidism, IBS (irritable bowel syndrome), Memory loss, Mental disorder, Migraine, Preoperative examination (02/26/2013), Seizures ([place]), and Shortness of breath. here with:  1.  Chronic migraine headaches -Well controlled, are rare now -Continue Ubrelvy as needed for acute headache -Has done well with taper off Topamax -Decrease Lyrica from 150 mg 3 times daily, to 150 mg twice daily, no script sent in, if does well will further reduce to 100 mg twice daily -Continue Effexor XR 37.5 mg twice daily for migraine, and mood -Return 6-8 months at our office or sooner if needed  2.  Mild cognitive impairment -Slow progressive, likely polypharmacy contributes -Saw [name], pending neuropsychological evaluation -MOCA was 16/30 with [name]  -Son thinks much improvement with elimination of Topamax, hopefully even better with lower dose Lyrica  3.  Gait abnormality -Multifactorial including deconditioning, aging, multiple  joint pain  4. Reported history of seizure -None recent, has done well off Topamax   I spent 30 minutes of face-to-face and non-face-to-face time with patient.  This included previsit chart review, lab review, study review, order entry, electronic health record documentation, patient education.  [name], AGNP-C, DNP 11/08/2020, 2:43 PM [facility] [address], [place], [place] [address] [phone]

## 2020-11-11 ENCOUNTER — Telehealth: Payer: Self-pay

## 2020-11-11 NOTE — Telephone Encounter (Signed)
I connected by phone with [name] and/or patient's caregiver on 11/11/2020 at 1:21 PM to discuss the potential vaccination through our Homebound vaccination initiative.   Prevaccination Checklist for COVID-19 Vaccines  1.  Are you feeling sick today? no  2.  Have you ever received a dose of a COVID-19 vaccine?  yes      If yes, which one? Pfizer   How many dose of Covid-19 vaccine have your received and dates ? 2, 10/18/2019, 11/12/2019   Check all that apply: I live in a long-term care setting. no  I have been diagnosed with a medical condition(s). Please list: cognitive disorder (pertinent to homebound status)  I am a first responder. no  I work in a long-term care facility, correctional facility, hospital, restaurant, retail setting, school, or other setting with high exposure to the public. no  4. Do you have a health condition or are you undergoing treatment that makes you moderately or severely immunocompromised? (This would include treatment for cancer or HIV, receipt of organ transplant, immunosuppressive therapy or high-dose corticosteroids, CAR-T-cell therapy, hematopoietic cell transplant [HCT], DiGeorge syndrome or Wiskott-Aldrich syndrome)  no  5. Have you received hematopoietic cell transplant (HCT) or CAR-T-cell therapies since receiving COVID-19 vaccine? no  6.  Have you ever had an allergic reaction: (This would include a severe reaction [ e.g., anaphylaxis] that required treatment with epinephrine or EpiPen or that caused you to go to the hospital.  It would also include an allergic reaction that occurred within 4 hours that caused hives, swelling, or respiratory distress, including wheezing.) A.  A previous dose of COVID-19 vaccine. no  B.  A vaccine or injectable therapy that contains multiple components, one of which is a COVID-19 vaccine component, but it is not known which component elicited the immediate reaction. no  C.  Are you allergic to polyethylene glycol? no   D. Are you allergic to Polysorbate, which is found in some vaccines, film coated tablets and intravenous steroids?  no   7.  Have you ever had an allergic reaction to another vaccine (other than COVID-19 vaccine) or an injectable medication? (This would include a severe reaction [ e.g., anaphylaxis] that required treatment with epinephrine or EpiPen or that caused you to go to the hospital.  It would also include an allergic reaction that occurred within 4 hours that caused hives, swelling, or respiratory distress, including wheezing.)  no   8.  Have you ever had a severe allergic reaction (e.g., anaphylaxis) to something other than a component of the COVID-19 vaccine, or any vaccine or injectable medication?  This would include food, pet, venom, environmental, or oral medication allergies.  no   Check all that apply to you:  Am a female between ages 77 and 43 years old  no  Women 77 through 77 years of age can receive any FDA-authorized or -approved COVID-19 vaccine. However, they should be informed of the rare but increased risk of thrombosis with thrombocytopenia syndrome (TTS) after receipt of the [employer] Vaccine and the availability of other FDA-authorized and -approved COVID-19 vaccines. People who had TTS after a first dose of Janssen vaccine should not receive a subsequent dose of Janssen product    Am a female between ages 77 and 56 years old  no Males 5 through 77 years of age may receive the correct formulation of Pfizer-BioNTech COVID-19 vaccine. Males 18 and older can receive any FDA-authorized or -approved vaccine. However, people receiving an mRNA COVID-19 vaccine, especially  males 95 through 77 years of age and their parents/legal representative (when relevant), should be informed of the risk of developing myocarditis (an inflammation of the heart muscle) or pericarditis (inflammation of the lining around the heart) after receipt of an mRNA vaccine. The risk of developing either  myocarditis or pericarditis after vaccination is low, and lower than the risk of myocarditis associated with SARS-CoV-2 infection in adolescents and adults. Vaccine recipients should be counseled about the need to seek care if symptoms of myocarditis or pericarditis develop after vaccination     Have a history of myocarditis or pericarditis  no Myocarditis or pericarditis after receipt of the first dose of an mRNA COVID-19 vaccine series but before administration of the second dose  Experts advise that people who develop myocarditis or pericarditis after a dose of an mRNA COVID-19 vaccine not receive a subsequent dose of any COVID-19 vaccine, until additional safety data are available.  Administration of a subsequent dose of COVID-19 vaccine before safety data are available can be considered in certain circumstances after the episode of myocarditis or pericarditis has completely resolved. Until additional data are available, some experts recommend a Alphonsa Overall COVID-19 vaccine be considered instead of an mRNA COVID-19 vaccine. Decisions about proceeding with a subsequent dose should include a conversation between the patient, their parent/legal representative (when relevant), and their clinical team, which may include a cardiologist.    Have been treated with monoclonal antibodies or convalescent serum to prevent or treat COVID-19  no Vaccination should be offered to people regardless of history of prior symptomatic or asymptomatic SARS-CoV-2 infection. There is no recommended minimal interval between infection and vaccination.  However, vaccination should be deferred if a patient received monoclonal antibodies or convalescent serum as treatment for COVID-19 or for post-exposure prophylaxis. This is a precautionary measure until additional information becomes available, to avoid interference of the antibody treatment with vaccine-induced immune responses.  Defer COVID-19 vaccination for 30 days when a  passive antibody product was used for post-exposure prophylaxis.  Defer COVID-19 vaccination for 90 days when a passive antibody product was used to treat COVID-19.     Diagnosed with Multisystem Inflammatory Syndrome (MIS-C or MIS-A) after a COVID-19 infection  no It is unknown if people with a history of MIS-C or MIS-A are at risk for a dysregulated immune response to COVID-19 vaccination.  People with a history of MIS-C or MIS-A may choose to be vaccinated. Considerations for vaccination may include:   Clinical recovery from MIS-C or MIS-A, including return to normal cardiac function   Personal risk of severe acute COVID-19 (e.g., age, underlying conditions)   High or substantial community transmission of SARS-CoV-2 and personal increased risk of reinfection.   Timing of any immunomodulatory therapies (general best practice guidelines for immunization can be consulted for more information [email or web address])   It has been 90 days or more since their diagnosis of MIS-C   Onset of MIS-C occurred before any COVID-19 vaccination   A conversation between the patient, their guardian(s), and their clinical team or a specialist may assist with COVID-19 vaccination decisions. Healthcare providers and health departments may also request a consultation from the [facility] at [email or web address].     Have a bleeding disorder  no Take a blood thinner  no As with all vaccines, any COVID-19 vaccine product may be given to these patients, if a physician familiar with the patient's bleeding risk determines that the vaccine can be administered intramuscularly with reasonable safety.  ACIP  recommends the following technique for intramuscular vaccination in patients with bleeding disorders or taking blood thinners: a fine-gauge needle (23-gauge or smaller caliber) should be used  for the vaccination, followed by firm pressure on the site, without rubbing, for at least 2 minutes.  People who regularly take aspirin or anticoagulants as part of their routine medications do not need to stop these medications prior to receipt of any COVID-19 vaccine.    Have a history of heparin-induced thrombocytopenia (HIT)  no Although the etiology of TTS associated with the Alphonsa Overall COVID-19 vaccine is unclear, it appears to be similar to another rare immune-mediated syndrome, heparin-induced thrombocytopenia (HIT). People with a history of an episode of an immune-mediated syndrome characterized by thrombosis and thrombocytopenia, such as HIT, should be offered a currently FDA-approved or FDA-authorized mRNA COVID-19 vaccine if it has been ?90 days since their TTS resolved. After 90 days, patients may be vaccinated with any currently FDA-approved or FDA-authorized COVID-19 vaccine, including Janssen COVID-19 Vaccine. However, people who developed TTS after their initial Alphonsa Overall vaccine should not receive a Janssen booster dose.  Experts believe the following factors do not make people more susceptible to TTS after receipt of the [employer]. People with these conditions can be vaccinated with any FDA-authorized or - approved COVID-19 vaccine, including the [employer] COVID-19 Vaccine:   A prior history of venous thromboembolism   Risk factors for venous thromboembolism (e.g., inherited or acquired thrombophilia including Factor V Leiden; prothrombin gene 20210A mutation; antiphospholipid syndrome; protein C, protein S or antithrombin deficiency   A prior history of other types of thromboses not associated with thrombocytopenia   Pregnancy, post-partum status, or receipt of hormonal contraceptives (e.g., combined oral contraceptives, patch, ring)   Additional recipient education materials can be found at http://gutierrez-[email or web address]/ vaccines/safety/JJUpdate.html.    Am currently  pregnant or breastfeeding  no Vaccination is recommended for all people aged 63 years and older, including people that are:   Pregnant   Breastfeeding   Trying to get pregnant now or who might become pregnant in the future   Pregnant, breastfeeding, and post-partum people 66 through 77 years of age should be aware of the rare risk of TTS after receipt of the Alphonsa Overall COVID-19 Vaccine and the availability of other FDA-authorized or -approved COVID-19 vaccines (i.e., mRNA vaccines).    Have received dermal fillers  no FDA-authorized or -approved COVID-19 vaccines can be administered to people who have received injectable dermal fillers who have no contraindications for vaccination.  Infrequently, these people might experience temporary swelling at or near the site of filler injection (usually the face or lips) following administration of a dose of an mRNA COVID-19 vaccine. These people should be advised to contact their healthcare provider if swelling develops at or near the site of dermal filler following vaccination.     Have a history of Guillain-Barr Syndrome (GBS)  no People with a history of GBS can receive any FDA-authorized or -approved COVID-19 vaccine. However, given the possible association between the [employer] and an increased risk of GBS, a patient with a history of GBS and their clinical team should discuss the availability of mRNA vaccines to offer protection against COVID-19. The highest risk has been observed in men aged 50-64 years with symptoms of GBS beginning within 42 days after Alphonsa Overall COVID-19 vaccination.  People who had GBS after receiving Janssen vaccine should be made aware of the option to receive an mRNA COVID-19 vaccine booster at least 2 months (8 weeks) after the  Janssen dose. However, Alphonsa Overall vaccine may be used as a booster, particularly if GBS occurred more than 42 days after vaccination or was related to a non-vaccine factor. Prior to booster  vaccination, a conversation between the patient and their clinical team may assist with decisions about use of a COVID-19 booster dose, including the timing of administration     Postvaccination Observation Times for People without Contraindications to Covid 19 Vaccination.  30 minutes:  People with a history of: A contraindication to another type of COVID-19 vaccine product (i.e., mRNA or viral vector COVID-19 vaccines)   Immediate (within 4 hours of exposure) non-severe allergic reaction to a COVID-19 vaccine or injectable therapies   Anaphylaxis due to any cause   Immediate allergic reaction of any severity to a non-COVID-19 vaccine   15 minutes: All other people  This patient is a 75 y.o. female that meets the FDA criteria to receive homebound vaccination. Patient or parent/caregiver understands they have the option to accept or refuse homebound vaccination.  Patient passed the pre-screening checklist and would like to proceed with homebound vaccination.  Based on questionnaire above, I recommend the patient be observed for 15 minutes.  There are an estimated #0 other household members/caregivers who are also interested in receiving the vaccine.    The patient has been confirmed homebound and eligible for homebound vaccination with the considerations outlined above. I will send the patient's information to our scheduling team who will reach out to schedule the patient and potential caregiver/family members for homebound vaccination.    Dan Humphreys 11/11/2020 1:21 PM

## 2020-11-15 ENCOUNTER — Encounter: Payer: Self-pay | Admitting: Physician Assistant

## 2020-11-15 ENCOUNTER — Ambulatory Visit (INDEPENDENT_AMBULATORY_CARE_PROVIDER_SITE_OTHER): Payer: Medicare Other | Admitting: Physician Assistant

## 2020-11-15 ENCOUNTER — Other Ambulatory Visit: Payer: Self-pay

## 2020-11-15 VITALS — BP 129/79 | HR 74 | Temp 95.9°F | Ht 65.0 in | Wt 228.2 lb

## 2020-11-15 DIAGNOSIS — R21 Rash and other nonspecific skin eruption: Secondary | ICD-10-CM

## 2020-11-15 MED ORDER — PREDNISONE 20 MG PO TABS: ORAL_TABLET | ORAL | 0 refills | Status: DC

## 2020-11-15 MED ORDER — MUPIROCIN CALCIUM 2 % EX CREA: 1.0000 "application " | TOPICAL_CREAM | Freq: Two times a day (BID) | CUTANEOUS | 0 refills | Status: DC

## 2020-11-15 NOTE — Progress Notes (Signed)
Acute Office Visit  Subjective:    Patient ID: [name], female    DOB: [birth year], 75 y.o.   MRN: [id number]  Chief Complaint  Patient presents with  . Rash    HPI Patient is in today for itchy rash on both arms x couple of weeks. Denies fever, chills, tick bite, abdominal pain, nausea or vomiting. Patient has chronic pain/arthralgias. Started using hypoallergenic detergent 2 days ago. Denies new medications or soaps. Bed sheets are cleaned routinely by house maids that come to help.   Past Medical History:  Diagnosis Date  . Anemia    took Fe- orally 2 yrs. ago   . Anxiety    panic attacks   . Arthritis    shoulders, knees  . Cancer ([place])    skin- preCA  . Depression   . GERD (gastroesophageal reflux disease)    uses Protonix as needed   . Headache(784.0)   . Humerus fracture    Right   . Hyposmolality and/or hyponatremia 02/27/2013  . Hypothyroidism   . IBS (irritable bowel syndrome)   . Memory loss   . Mental disorder   . Migraine   . Preoperative examination 02/26/2013  . Seizures ([place])    caused by depression , seritonin seizure - effected short term memory    . Shortness of breath     Past Surgical History:  Procedure Laterality Date  . ABDOMINAL HYSTERECTOMY    . APPENDECTOMY    . BACK SURGERY  1990's  . BREAST SURGERY  1972   augmentation   . CHOLECYSTECTOMY    . GASTRECTOMY  1994   Due to ulcer, required multiple revisions.   Marland Kitchen HERNIA REPAIR    . ORIF ANKLE FRACTURE Right 2004  . REPLACEMENT TOTAL KNEE    . REVERSE SHOULDER ARTHROPLASTY Right 05/16/2013   Procedure: RIGHT HEMI ARTHROPLASTY  VERSES  REVERSE TOTAL SHOULDER ARTHROPLASTY ;  Surgeon: Augustin Schooling, MD;  Location: [facility];  Service: Orthopedics;  Laterality: Right;  . SPINE SURGERY  2005   Lumbar spinal stenosis   . STOMACH SURGERY     1/3 resection for obstruction  . TONSILLECTOMY    . TUBAL LIGATION      Family History  Problem Relation Age of Onset  . Aneurysm Mother   .  Heart disease Mother   . Cancer Father        pancreatic  . Stroke Father 55  . Hypertension Father   . Alcohol abuse Father   . Heart disease Maternal Grandfather     Social History   Socioeconomic History  . Marital status: Married    Spouse name: Not on file  . Number of children: 1  . Years of education: College  . Highest education level: Not on file  Occupational History  . Occupation: Retired  Tobacco Use  . Smoking status: Never Smoker  . Smokeless tobacco: Never Used  Vaping Use  . Vaping Use: Never used  Substance and Sexual Activity  . Alcohol use: Yes    Comment: rare use of white wine   . Drug use: No  . Sexual activity: Not on file  Other Topics Concern  . Not on file  Social History Narrative   Lives at home with her husband.   2 cups caffeine daily.   Right-handed.   Social Determinants of Health   Financial Resource Strain: Not on file  Food Insecurity: Not on file  Transportation Needs: Not on file  Physical Activity: Not on file  Stress: Not on file  Social Connections: Not on file  Intimate Partner Violence: Not on file    Outpatient Medications Prior to Visit  Medication Sig Dispense Refill  . aspirin 325 MG tablet Take 325 mg by mouth daily.    Marland Kitchen levothyroxine (SYNTHROID) 100 MCG tablet TAKE 1 TABLET BY MOUTH DAILY 90 tablet 1  . oxyCODONE (ROXICODONE) 15 MG immediate release tablet Take 1 tablet by mouth. May take up to 5 times daily    . pregabalin (LYRICA) 150 MG capsule TAKE 1 CAPSULE BY MOUTH TWICE DAILY 180 capsule 0  . ramelteon (ROZEREM) 8 MG tablet Take 1 tablet by mouth daily.    . traZODone (DESYREL) 50 MG tablet Take 50-150 mg at bedtime as needed by mouth for sleep (and may take one additional 50 mg tablet daily AS NEEDED for part of a "headache cocktail").     . Ubrogepant (UBRELVY) 50 MG TABS Take 50 mg by mouth as needed (may repeat dose in 2 hours, not to exceed 100 mg/day, do not take more than 8 days a month). 10 tablet  11  . venlafaxine (EFFEXOR) 37.5 MG tablet Take 1 tablet (37.5 mg total) by mouth 2 (two) times daily. 180 tablet 1  . vortioxetine HBr (TRINTELLIX) 20 MG TABS tablet Take 20 mg by mouth daily.     No facility-administered medications prior to visit.    Allergies  Allergen Reactions  . Bupropion Hcl Other (See Comments)    SEIZURES!!  . Dairy Aid [Lactase] Diarrhea  . Other   . Azithromycin Rash  . Erythromycin Rash    Review of Systems A fourteen system review of systems was performed and found to be positive as per HPI.  Objective:    Physical Exam General:  Well Developed, well nourished, appropriate for stated age.  Neuro:  Alert and oriented,  extra-ocular muscles intact  HEENT:  Normocephalic, atraumatic, neck supple Skin:  Maculopapular rash of both arms and upper back with excoriations and few open lesions with clear drainage  Cardiac:  RRR, S1 S2 wnl's, +murmur  Respiratory:  ECTA, Not using accessory muscles, speaking in full sentences- unlabored. Vascular:  Ext warm, no cyanosis apprec.; cap RF less 2 sec. +goss edema  Psych:  No HI/SI, judgement and insight good, Euthymic mood. Full Affect.   BP 129/79   Pulse 74   Temp (!) 95.9 F (35.5 C)   Ht 5\' 5"  (1.651 m)   Wt 228 lb 3.2 oz (103.5 kg)   SpO2 95%   BMI 37.97 kg/m  Wt Readings from Last 3 Encounters:  11/15/20 228 lb 3.2 oz (103.5 kg)  11/08/20 221 lb (100.2 kg)  10/22/20 215 lb 14.4 oz (97.9 kg)    Health Maintenance Due  Topic Date Due  . COVID-19 Vaccine (1) Never done  . TETANUS/TDAP  02/02/2013  . INFLUENZA VACCINE  04/04/2020    There are no preventive care reminders to display for this patient.   Lab Results  Component Value Date   TSH 2.980 03/19/2020   Lab Results  Component Value Date   WBC 6.8 09/09/2020   HGB 11.5 (L) 09/09/2020   HCT 38.2 09/09/2020   MCV 78.3 (L) 09/09/2020   PLT 115 (L) 09/09/2020   Lab Results  Component Value Date   NA 134 (L) 09/09/2020   K 4.9  09/09/2020   CO2 18 (L) 09/09/2020   GLUCOSE 76 09/09/2020   BUN 13 09/09/2020  CREATININE 1.02 (H) 09/09/2020   BILITOT 0.2 03/19/2020   ALKPHOS 76 03/19/2020   AST 19 03/19/2020   ALT 9 03/19/2020   PROT 6.5 03/19/2020   ALBUMIN 4.3 03/19/2020   CALCIUM 8.4 (L) 09/09/2020   ANIONGAP 12 09/09/2020   Lab Results  Component Value Date   CHOL 179 02/04/2019   Lab Results  Component Value Date   HDL 83 02/04/2019   Lab Results  Component Value Date   LDLCALC 78 02/04/2019   Lab Results  Component Value Date   TRIG 89 02/04/2019   Lab Results  Component Value Date   CHOLHDL 2.2 02/04/2019   Lab Results  Component Value Date   HGBA1C 5.5 03/19/2020       Assessment & Plan:   Problem List Items Addressed This Visit   None   Visit Diagnoses    Rash    -  Primary   Relevant Medications   predniSONE (DELTASONE) 20 MG tablet   mupirocin cream (BACTROBAN) 2 %     Rash: -Advised patient to avoid scratching and apply topical antibiotic on open lesions. Will start oral corticosteroid given rash is widespread on both arms and back for possible dermatitis. Advised to let me know if rash fails to improve or worsen.   Meds ordered this encounter  Medications  . predniSONE (DELTASONE) 20 MG tablet    Sig: Take 2 tablets by mouth daily x 2 days, then 1 tablet x 2 days, then 0.5 tablet x 2 days    Dispense:  7 tablet    Refill:  0    Order Specific Question:   Supervising Provider    Answer:   Beatrice Lecher D [[id number]]  . mupirocin cream (BACTROBAN) 2 %    Sig: Apply 1 application topically 2 (two) times daily.    Dispense:  15 g    Refill:  0    Order Specific Question:   Supervising Provider    Answer:   Beatrice Lecher D [[id number]]     Lorrene Reid, PA-C

## 2020-11-15 NOTE — Patient Instructions (Signed)
Rash, Adult  A rash is a change in the color of your skin. A rash can also change the way your skin feels. There are many different conditions and factors that can cause a rash. Follow these instructions at home: The goal of treatment is to stop the itching and keep the rash from spreading. Watch for any changes in your symptoms. Let your doctor know about them. Follow these instructions to help with your condition: Medicine Take or apply over-the-counter and prescription medicines only as told by your doctor. These may include medicines:  To treat red or swollen skin (corticosteroid creams).  To treat itching.  To treat an allergy (oral antihistamines).  To treat very bad symptoms (oral corticosteroids).   Skin care  Put cool cloths (compresses) on the affected areas.  Do not scratch or rub your skin.  Avoid covering the rash. Make sure that the rash is exposed to air as much as possible. Managing itching and discomfort  Avoid hot showers or baths. These can make itching worse. A cold shower may help.  Try taking a bath with: ? Epsom salts. You can get these at your local pharmacy or grocery store. Follow the instructions on the package. ? Baking soda. Pour a small amount into the bath as told by your doctor. ? Colloidal oatmeal. You can get this at your local pharmacy or grocery store. Follow the instructions on the package.  Try putting baking soda paste onto your skin. Stir water into baking soda until it gets like a paste.  Try putting on a lotion that relieves itchiness (calamine lotion).  Keep cool and out of the sun. Sweating and being hot can make itching worse. General instructions  Rest as needed.  Drink enough fluid to keep your pee (urine) pale yellow.  Wear loose-fitting clothing.  Avoid scented soaps, detergents, and perfumes. Use gentle soaps, detergents, perfumes, and other cosmetic products.  Avoid anything that causes your rash. Keep a journal to help  track what causes your rash. Write down: ? What you eat. ? What cosmetic products you use. ? What you drink. ? What you wear. This includes jewelry.  Keep all follow-up visits as told by your doctor. This is important.   Contact a doctor if:  You sweat at night.  You lose weight.  You pee (urinate) more than normal.  You pee less than normal, or you notice that your pee is a darker color than normal.  You feel weak.  You throw up (vomit).  Your skin or the whites of your eyes look yellow (jaundice).  Your skin: ? Tingles. ? Is numb.  Your rash: ? Does not go away after a few days. ? Gets worse.  You are: ? More thirsty than normal. ? More tired than normal.  You have: ? New symptoms. ? Pain in your belly (abdomen). ? A fever. ? Watery poop (diarrhea). Get help right away if:  You have a fever and your symptoms suddenly get worse.  You start to feel mixed up (confused).  You have a very bad headache or a stiff neck.  You have very bad joint pains or stiffness.  You have jerky movements that you cannot control (seizure).  Your rash covers all or most of your body. The rash may or may not be painful.  You have blisters that: ? Are on top of the rash. ? Grow larger. ? Grow together. ? Are painful. ? Are inside your nose or mouth.  You have   a rash that: ? Looks like purple pinprick-sized spots all over your body. ? Has a "bull's eye" or looks like a target. ? Is red and painful, causes your skin to peel, and is not from being in the sun too long. Summary  A rash is a change in the color of your skin. A rash can also change the way your skin feels.  The goal of treatment is to stop the itching and keep the rash from spreading.  Take or apply over-the-counter and prescription medicines only as told by your doctor.  Contact a doctor if you have new symptoms or symptoms that get worse.  Keep all follow-up visits as told by your doctor. This is  important. This information is not intended to replace advice given to you by your health care provider. Make sure you discuss any questions you have with your health care provider. Document Revised: 12/13/2018 Document Reviewed: 03/25/2018 Elsevier Patient Education  2021 Elsevier Inc.  

## 2020-12-06 ENCOUNTER — Encounter: Payer: Medicare Other | Admitting: Counselor

## 2020-12-06 ENCOUNTER — Ambulatory Visit: Payer: Medicare Other | Admitting: Physician Assistant

## 2020-12-09 ENCOUNTER — Ambulatory Visit: Payer: Medicare Other

## 2020-12-15 ENCOUNTER — Telehealth: Payer: Self-pay | Admitting: Physician Assistant

## 2020-12-15 DIAGNOSIS — R21 Rash and other nonspecific skin eruption: Secondary | ICD-10-CM

## 2020-12-15 NOTE — Telephone Encounter (Signed)
Referral has been placed. AS, CMA 

## 2020-12-15 NOTE — Telephone Encounter (Signed)
Patient never received a call for a referral for a dermatology. Patient needs another referral for dermatology. Please advise, thanks.

## 2020-12-15 NOTE — Addendum Note (Signed)
Addended by: Mickel Crow on: 12/15/2020 04:41 PM   Modules accepted: Orders

## 2020-12-24 ENCOUNTER — Other Ambulatory Visit: Payer: Self-pay | Admitting: Physician Assistant

## 2020-12-24 ENCOUNTER — Encounter: Payer: Medicare Other | Admitting: Counselor

## 2020-12-24 DIAGNOSIS — R21 Rash and other nonspecific skin eruption: Secondary | ICD-10-CM

## 2020-12-24 MED ORDER — MUPIROCIN CALCIUM 2 % EX CREA: 1.0000 "application " | TOPICAL_CREAM | Freq: Two times a day (BID) | CUTANEOUS | 0 refills | Status: DC

## 2020-12-27 ENCOUNTER — Other Ambulatory Visit: Payer: Self-pay

## 2020-12-27 ENCOUNTER — Ambulatory Visit (INDEPENDENT_AMBULATORY_CARE_PROVIDER_SITE_OTHER): Payer: Medicare Other | Admitting: Nurse Practitioner

## 2020-12-27 ENCOUNTER — Encounter: Payer: Self-pay | Admitting: Nurse Practitioner

## 2020-12-27 VITALS — BP 116/64 | HR 77 | Temp 94.7°F | Ht 65.0 in | Wt 219.0 lb

## 2020-12-27 DIAGNOSIS — L739 Follicular disorder, unspecified: Secondary | ICD-10-CM

## 2020-12-27 DIAGNOSIS — R21 Rash and other nonspecific skin eruption: Secondary | ICD-10-CM | POA: Diagnosis not present

## 2020-12-27 MED ORDER — MUPIROCIN CALCIUM 2 % EX CREA: 1.0000 "application " | TOPICAL_CREAM | Freq: Two times a day (BID) | CUTANEOUS | 2 refills | Status: DC

## 2020-12-27 MED ORDER — TRIAMCINOLONE ACETONIDE 0.025 % EX CREA
1.0000 | TOPICAL_CREAM | Freq: Two times a day (BID) | CUTANEOUS | 2 refills | Status: DC
Start: 2020-12-27 — End: 2021-11-25

## 2020-12-27 MED ORDER — SULFAMETHOXAZOLE-TRIMETHOPRIM 400-80 MG PO TABS: 1.0000 | ORAL_TABLET | Freq: Two times a day (BID) | ORAL | 0 refills | Status: DC

## 2020-12-27 MED ORDER — METHYLPREDNISOLONE ACETATE 40 MG/ML IJ SUSP
40.0000 mg | Freq: Once | INTRAMUSCULAR | Status: AC
  Administered 2020-12-27: 40 mg via INTRAMUSCULAR

## 2020-12-27 NOTE — Progress Notes (Signed)
Acute Office Visit  Subjective:    Patient ID: [name], female    DOB: 1945-12-07, 75 y.o.   MRN: [id number]  Chief Complaint  Patient presents with  . Rash    HPI Patient is in today for evaluation of rash. She states that rash is more like collections of sores. These are mostly on her arms, shoulders, and lower legs. She states that they are very itchy and sting when she scratches them. She states that she does scratch them. Often, she scratches them until they bleed. She was treated for this back in March. Was given prescription for prednisone taper and bactroban ointment. She states that this did help and lesions were nearly clear when she ran out of the ointment. She called office last week and was referred to dermatology. Dermatology does not have an appointment for her until September. She was given a new prescription for the ointment on Thursday and started using it as soon as she was able to do. Her son, who is present with her today, states that there is already a significant improvement in symptoms since she restarted using the ointment.   Past Medical History:  Diagnosis Date  . Anemia    took Fe- orally 2 yrs. ago   . Anxiety    panic attacks   . Arthritis    shoulders, knees  . Cancer ([place])    skin- preCA  . Depression   . GERD (gastroesophageal reflux disease)    uses Protonix as needed   . Headache(784.0)   . Humerus fracture    Right   . Hyposmolality and/or hyponatremia 02/27/2013  . Hypothyroidism   . IBS (irritable bowel syndrome)   . Memory loss   . Mental disorder   . Migraine   . Preoperative examination 02/26/2013  . Seizures ([place])    caused by depression , seritonin seizure - effected short term memory    . Shortness of breath     Past Surgical History:  Procedure Laterality Date  . ABDOMINAL HYSTERECTOMY    . APPENDECTOMY    . BACK SURGERY  1990's  . BREAST SURGERY  1972   augmentation   . CHOLECYSTECTOMY    . GASTRECTOMY  1994   Due  to ulcer, required multiple revisions.   Marland Kitchen HERNIA REPAIR    . ORIF ANKLE FRACTURE Right 2004  . REPLACEMENT TOTAL KNEE    . REVERSE SHOULDER ARTHROPLASTY Right 05/16/2013   Procedure: RIGHT HEMI ARTHROPLASTY  VERSES  REVERSE TOTAL SHOULDER ARTHROPLASTY ;  Surgeon: Augustin Schooling, MD;  Location: [facility];  Service: Orthopedics;  Laterality: Right;  . SPINE SURGERY  2005   Lumbar spinal stenosis   . STOMACH SURGERY     1/3 resection for obstruction  . TONSILLECTOMY    . TUBAL LIGATION      Family History  Problem Relation Age of Onset  . Aneurysm Mother   . Heart disease Mother   . Cancer Father        pancreatic  . Stroke Father 55  . Hypertension Father   . Alcohol abuse Father   . Heart disease Maternal Grandfather     Social History   Socioeconomic History  . Marital status: Married    Spouse name: Not on file  . Number of children: 1  . Years of education: College  . Highest education level: Not on file  Occupational History  . Occupation: Retired  Tobacco Use  . Smoking status: Never  Smoker  . Smokeless tobacco: Never Used  Vaping Use  . Vaping Use: Never used  Substance and Sexual Activity  . Alcohol use: Yes    Comment: rare use of white wine   . Drug use: No  . Sexual activity: Not on file  Other Topics Concern  . Not on file  Social History Narrative   Lives at home with her husband.   2 cups caffeine daily.   Right-handed.   Social Determinants of Health   Financial Resource Strain: Not on file  Food Insecurity: Not on file  Transportation Needs: Not on file  Physical Activity: Not on file  Stress: Not on file  Social Connections: Not on file  Intimate Partner Violence: Not on file    Outpatient Medications Prior to Visit  Medication Sig Dispense Refill  . aspirin 325 MG tablet Take 325 mg by mouth daily.    Marland Kitchen levothyroxine (SYNTHROID) 100 MCG tablet TAKE 1 TABLET BY MOUTH DAILY 90 tablet 1  . oxyCODONE (ROXICODONE) 15 MG immediate release  tablet Take 1 tablet by mouth. May take up to 5 times daily    . pregabalin (LYRICA) 150 MG capsule TAKE 1 CAPSULE BY MOUTH TWICE DAILY 180 capsule 0  . ramelteon (ROZEREM) 8 MG tablet Take 1 tablet by mouth daily.    . traZODone (DESYREL) 50 MG tablet Take 50-150 mg at bedtime as needed by mouth for sleep (and may take one additional 50 mg tablet daily AS NEEDED for part of a "headache cocktail").     . Ubrogepant (UBRELVY) 50 MG TABS Take 50 mg by mouth as needed (may repeat dose in 2 hours, not to exceed 100 mg/day, do not take more than 8 days a month). 10 tablet 11  . venlafaxine (EFFEXOR) 37.5 MG tablet Take 1 tablet (37.5 mg total) by mouth 2 (two) times daily. 180 tablet 1  . vortioxetine HBr (TRINTELLIX) 20 MG TABS tablet Take 20 mg by mouth daily.    . mupirocin cream (BACTROBAN) 2 % Apply 1 application topically 2 (two) times daily. 15 g 0  . predniSONE (DELTASONE) 20 MG tablet Take 2 tablets by mouth daily x 2 days, then 1 tablet x 2 days, then 0.5 tablet x 2 days (Patient not taking: Reported on 12/27/2020) 7 tablet 0   No facility-administered medications prior to visit.    Allergies  Allergen Reactions  . Bupropion Hcl Other (See Comments)    SEIZURES!!  . Dairy Aid [Lactase] Diarrhea  . Other   . Azithromycin Rash  . Erythromycin Rash    Review of Systems  Constitutional: Negative for activity change, chills and fever.  HENT: Negative for congestion and sinus pain.   Eyes: Negative.   Respiratory: Negative for cough, shortness of breath and wheezing.   Cardiovascular: Negative for chest pain and palpitations.  Gastrointestinal: Negative for constipation, diarrhea, nausea and vomiting.  Musculoskeletal: Negative for back pain and myalgias.  Skin: Positive for rash.       Very itchy and red sores. Start as pimple type lesion,, but itch. She states that she scratches them until they bleed. Then they scab over and sting. She has some on both forearms. There is large  collection on right shoulder. There are several on both lower legs.   Allergic/Immunologic: Negative.   Neurological: Negative for dizziness, weakness and headaches.  Hematological: Negative for adenopathy.  Psychiatric/Behavioral: Negative for dysphoric mood. The patient is not nervous/anxious.   All other systems reviewed and are  negative.      Objective:    Physical Exam Vitals and nursing note reviewed.  Constitutional:      Appearance: Normal appearance. She is well-developed.  HENT:     Head: Normocephalic and atraumatic.     Nose: Nose normal.  Eyes:     Conjunctiva/sclera: Conjunctivae normal.     Pupils: Pupils are equal, round, and reactive to light.  Cardiovascular:     Rate and Rhythm: Normal rate and regular rhythm.     Pulses: Normal pulses.     Heart sounds: Normal heart sounds.  Pulmonary:     Effort: Pulmonary effort is normal.     Breath sounds: Normal breath sounds.  Abdominal:     Palpations: Abdomen is soft.  Musculoskeletal:        General: Normal range of motion.     Cervical back: Normal range of motion and neck supple.  Skin:    General: Skin is warm and dry.     Capillary Refill: Capillary refill takes less than 2 seconds.     Comments: There are several red, roundish, scabbed lesions on the forearms and lower legs. They are slightly raised up from surrounding skin. Described as very itchy. There is collection of several of these lesions on right shoulder. All lesions have mild erythema without evidence of infection.   Neurological:     General: No focal deficit present.     Mental Status: She is alert and oriented to person, place, and time.  Psychiatric:        Mood and Affect: Mood normal.        Behavior: Behavior normal.        Thought Content: Thought content normal.        Judgment: Judgment normal.     Today's Vitals   12/27/20 0936  BP: 116/64  Pulse: 77  Temp: (!) 94.7 F (34.8 C)  SpO2: 91%  Weight: 219 lb (99.3 kg)  Height:  5\' 5"  (1.651 m)   Body mass index is 36.44 kg/m.   Wt Readings from Last 3 Encounters:  12/27/20 219 lb (99.3 kg)  11/15/20 228 lb 3.2 oz (103.5 kg)  11/08/20 221 lb (100.2 kg)    Health Maintenance Due  Topic Date Due  . COVID-19 Vaccine (1) Never done  . TETANUS/TDAP  02/02/2013    There are no preventive care reminders to display for this patient.   Lab Results  Component Value Date   TSH 2.980 03/19/2020   Lab Results  Component Value Date   WBC 6.8 09/09/2020   HGB 11.5 (L) 09/09/2020   HCT 38.2 09/09/2020   MCV 78.3 (L) 09/09/2020   PLT 115 (L) 09/09/2020   Lab Results  Component Value Date   NA 134 (L) 09/09/2020   K 4.9 09/09/2020   CO2 18 (L) 09/09/2020   GLUCOSE 76 09/09/2020   BUN 13 09/09/2020   CREATININE 1.02 (H) 09/09/2020   BILITOT 0.2 03/19/2020   ALKPHOS 76 03/19/2020   AST 19 03/19/2020   ALT 9 03/19/2020   PROT 6.5 03/19/2020   ALBUMIN 4.3 03/19/2020   CALCIUM 8.4 (L) 09/09/2020   ANIONGAP 12 09/09/2020   Lab Results  Component Value Date   CHOL 179 02/04/2019   Lab Results  Component Value Date   HDL 83 02/04/2019   Lab Results  Component Value Date   LDLCALC 78 02/04/2019   Lab Results  Component Value Date   TRIG 89 02/04/2019  Lab Results  Component Value Date   CHOLHDL 2.2 02/04/2019   Lab Results  Component Value Date   HGBA1C 5.5 03/19/2020       Assessment & Plan:  1. Folliculitis Single strength bactrim should be taken twice daily for next 10 days. Add triamcinolone 0.025% cream which may be applied to all lesions twice daily to help with itching and irritation. Continue to use bactroban ointment twice daily to prevent localized infection. New referral to dermatology today.  - triamcinolone (KENALOG) 0.025 % cream; Apply 1 application topically 2 (two) times daily.  Dispense: 80 g; Refill: 2 - sulfamethoxazole-trimethoprim (BACTRIM) 400-80 MG tablet; Take 1 tablet by mouth 2 (two) times daily.  Dispense: 20  tablet; Refill: 0 - Ambulatory referral to Dermatology - mupirocin cream (BACTROBAN) 2 %; Apply 1 application topically 2 (two) times daily.  Dispense: 30 g; Refill: 2  2. Rash Depo-medrol 40mg  injection administered in the office to help reduce itching and irritation. Triamcinolone cream and bactroban ointment may be used twice daily to treat inflammation/infection/irritation. New referral to dermatology today.  - triamcinolone (KENALOG) 0.025 % cream; Apply 1 application topically 2 (two) times daily.  Dispense: 80 g; Refill: 2 - Ambulatory referral to Dermatology - mupirocin cream (BACTROBAN) 2 %; Apply 1 application topically 2 (two) times daily.  Dispense: 30 g; Refill: 2 - methylPREDNISolone acetate (DEPO-MEDROL) injection 40 mg   Problem List Items Addressed This Visit      Musculoskeletal and Integument   Folliculitis - Primary   Relevant Medications   triamcinolone (KENALOG) 0.025 % cream   sulfamethoxazole-trimethoprim (BACTRIM) 400-80 MG tablet   mupirocin cream (BACTROBAN) 2 %   Other Relevant Orders   Ambulatory referral to Dermatology   Rash   Relevant Medications   triamcinolone (KENALOG) 0.025 % cream   mupirocin cream (BACTROBAN) 2 %   Other Relevant Orders   Ambulatory referral to Dermatology       Meds ordered this encounter  Medications  . triamcinolone (KENALOG) 0.025 % cream    Sig: Apply 1 application topically 2 (two) times daily.    Dispense:  80 g    Refill:  2    Order Specific Question:   Supervising Provider    Answer:   Beatrice Lecher D [[id number]]  . sulfamethoxazole-trimethoprim (BACTRIM) 400-80 MG tablet    Sig: Take 1 tablet by mouth 2 (two) times daily.    Dispense:  20 tablet    Refill:  0    Order Specific Question:   Supervising Provider    Answer:   Beatrice Lecher D [[id number]]  . mupirocin cream (BACTROBAN) 2 %    Sig: Apply 1 application topically 2 (two) times daily.    Dispense:  30 g    Refill:  2    Order Specific  Question:   Supervising Provider    Answer:   Beatrice Lecher D [[id number]]  . methylPREDNISolone acetate (DEPO-MEDROL) injection 40 mg     Ronnell Freshwater, NP

## 2020-12-27 NOTE — Patient Instructions (Signed)

## 2020-12-28 ENCOUNTER — Ambulatory Visit: Payer: Medicare Other | Attending: Critical Care Medicine

## 2020-12-28 DIAGNOSIS — Z23 Encounter for immunization: Secondary | ICD-10-CM

## 2020-12-28 NOTE — Progress Notes (Signed)
   Covid-19 Vaccination Clinic  Name:  [name]    MRN: [id number] DOB: [birth year]  12/28/2020  [name] was observed post Covid-19 immunization for 15 minutes without incident. She was provided with Vaccine Information Sheet and instruction to access the V-Safe system.   [name] was instructed to call 911 with any severe reactions post vaccine: Marland Kitchen Difficulty breathing  . Swelling of face and throat  . A fast heartbeat  . A bad rash all over body  . Dizziness and weakness   Immunizations Administered    Name Date Dose VIS Date Route   PFIZER Comrnaty(Gray TOP) Covid-19 Vaccine 12/28/2020 10:13 AM 0.3 mL 08/12/2020 Intramuscular   Manufacturer: [employer], [employer]   Lot: [id number]   NDC: [id number]

## 2020-12-29 ENCOUNTER — Encounter: Payer: Self-pay | Admitting: Neurology

## 2020-12-29 ENCOUNTER — Other Ambulatory Visit: Payer: Self-pay | Admitting: Neurology

## 2021-01-12 ENCOUNTER — Ambulatory Visit (INDEPENDENT_AMBULATORY_CARE_PROVIDER_SITE_OTHER): Payer: Medicare Other | Admitting: Counselor

## 2021-01-12 ENCOUNTER — Encounter: Payer: Self-pay | Admitting: Counselor

## 2021-01-12 ENCOUNTER — Other Ambulatory Visit: Payer: Self-pay

## 2021-01-12 ENCOUNTER — Ambulatory Visit: Payer: Medicare Other | Admitting: Psychology

## 2021-01-12 DIAGNOSIS — F0391 Unspecified dementia with behavioral disturbance: Secondary | ICD-10-CM

## 2021-01-12 DIAGNOSIS — F09 Unspecified mental disorder due to known physiological condition: Secondary | ICD-10-CM

## 2021-01-12 DIAGNOSIS — G931 Anoxic brain damage, not elsewhere classified: Secondary | ICD-10-CM | POA: Diagnosis not present

## 2021-01-12 DIAGNOSIS — I6782 Cerebral ischemia: Secondary | ICD-10-CM

## 2021-01-12 NOTE — Progress Notes (Signed)
   Psychometrist Note   Cognitive testing was administered to [name] by Milana Kidney, B.S. (Technician) under the supervision of Alphonzo Severance, Psy.D., ABN. [name] was able to tolerate all test procedures. Dr. [name] met with the patient as needed to manage any emotional reactions to the testing procedures. Rest breaks were offered.    The battery of tests administered was selected by Dr. [name] with consideration to the patient's current level of functioning, the nature of her symptoms, emotional and behavioral responses during the interview, level of literacy, observed level of motivation/effort, and the nature of the referral question. This battery was communicated to the psychometrist. Communication between Dr. [name] and the psychometrist was ongoing throughout the evaluation and Dr. [name] was immediately accessible at all times. Dr. [name] provided supervision to the technician on the date of this service, to the extent necessary to assure the quality of all services provided.    Ms. Liskey will return in approximately one week for an interactive feedback session with Dr. [name], at which time test performance, clinical impressions, and treatment recommendations will be reviewed in detail. The patient understands she can contact our office should she require our assistance before this time.   A total of 80 minutes of billable time were spent with [name] by the technician, including test administration and scoring time. Billing for these services is reflected in Dr. Les Pou note.   This note reflects time spent with the psychometrician and does not include test scores, clinical history, or any interpretations made by Dr. [name]. The full report will follow in a separate note.
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
## 2021-01-13 NOTE — Progress Notes (Signed)
     [facility]  Patient Name: [name] MRN: [id number] Date of Birth: Mar 06, 1946 Age: 75 y.o. Education: 18 years  Measurement properties of test scores: IQ, Index, and Standard Scores (SS): Mean = 100; Standard Deviation = 15 Scaled Scores (Ss): Mean = 10; Standard Deviation = 3 Z scores (Z): Mean = 0; Standard Deviation = 1 T scores (T); Mean = 50; Standard Deviation = 10  TEST SCORES:    Note: This summary of test scores accompanies the interpretive report and should not be interpreted by unqualified individuals or in isolation without reference to the report. Test scores are relative to age, gender, and educational history as available and appropriate.   Mental Status Screening     Total Score Descriptor  MMSE 18 Moderate Dementia      Expected Functioning        Wide Range Achievement Test (Word Reading): Standard/Scaled Score Percentile       Word Reading 111 77      Cognitive Testing        RBANS, Form : Standard/Scaled Score Percentile  Total Score 74 4  Immediate Memory 76 5      List Learning 3 1      Story Memory 8 25  Visuospatial/Constructional 87 19      Figure Copy   (18) 10 50      Judgment of Line Orientation   (12) --- 3-9  Language 85 16      Picture Naming --- 51-75      Semantic Fluency 4 2  Attention 64 1      Digit Span 8 25      Coding 1 <1  Delayed Memory 85 16      List Recall   (0) --- <2      List Recognition   (19) --- 26-50      Story Recall   (4) 5 5      Figure Recall   (5) 5 5      Neuropsychological Assessment Battery (Language Module): T-score Percentile      Naming   (30) 59 82      Verbal Fluency: T-score Percentile      Controlled Oral Word Association (F-A-S) 20 <1      Semantic Fluency (Animals) 40 16      Trail Making Test: T-Score Percentile      Part A 17 <1      Part B 23 <1      [place] Diagnostic Aphasia Exam: Raw Score Scaled Score      Complex Ideational Material 11 9       Clock Drawing Raw Score Descriptor      Command 8 Borderline Impairment      Rating Scales        Clinical Dementia Rating Raw Score Descriptor      Sum of Boxes 11.0 Moderate Dementia      Global Score 2.0 Moderate Dementia      Quick Dementia Rating System Raw Score Descriptor      Sum of Boxes 10 Moderate Dementia      Total Score 14.5 Moderate Dementia  Geriatric Depression Scale - Short Form 12 Positive   Syd Newsome V. [name] PsyD, [place] Clinical Neuropsychologist

## 2021-01-17 ENCOUNTER — Other Ambulatory Visit: Payer: Self-pay | Admitting: Physician Assistant

## 2021-01-20 ENCOUNTER — Ambulatory Visit (INDEPENDENT_AMBULATORY_CARE_PROVIDER_SITE_OTHER): Payer: Medicare Other | Admitting: Counselor

## 2021-01-20 ENCOUNTER — Other Ambulatory Visit: Payer: Self-pay

## 2021-01-20 DIAGNOSIS — F0390 Unspecified dementia without behavioral disturbance: Secondary | ICD-10-CM | POA: Diagnosis not present

## 2021-01-20 DIAGNOSIS — G894 Chronic pain syndrome: Secondary | ICD-10-CM | POA: Diagnosis not present

## 2021-01-20 NOTE — Patient Instructions (Signed)
Your performance and presentation on assessment was consistent with a major neurocognitive disorder level problem (which is synonymous with the term dementia), currently at a moderate level of progression. You had the most difficulties on measures of executive abilities, which involve things like judgment, problem solving, control of attention and the like. You had relatively better performance on measures that tend to be characteristic of Alzheimer's clinical syndrome. I think that your major neurocognitive disorder is mostly due to your previous hypoxic/ischemic brain injury and that the "worsening" noted by your family reflects interfering effects from your medications, interference related to ongoing depression, and other factors as opposed to a separate neurodegenerative illness.   Dementia refers to a group of syndromes where multiple areas of ability are damaged in the brain, such as memory, thinking, judgment, and behavior, and most commonly refers to age related causes of dementia that cause worsening in these abilities over time. Alzheimer's disease is the most common form of dementia in people over the age of 57. Not all dementias are Alzheimer's disease, but all Alzheimer's disease is dementia. When dementia is due to an underlying condition affecting the brain, such as Alzheimer's disease, there is progression over time, which typically proceeds gradually over many years.   In your case, I think that your dementia is due to hypoxic-ischemic brain damage (I.e., not enough oxygen to the brain), sustained 12 years ago. This means that the condition does not necessarily worsen over time, although of course aging in the setting of a preexisting major neurocognitive disorder can cause decline, albeit not in the same way that we see in progressive degenerative disorders.   I think that there are numerous issues that need to be attended to that are contributing to the agitation and behavior problems  your son and caregivers are noticing.  First, you are on very high doses of opiate medications. I suggest that your family work with your prescribing providers to minimize the use of brain impairing therapies and maximize the use of brain sparing therapies if at all possible.   Dementia is (somewhat arbitrarily) divided up into three different stages: Mild, Moderate, and Severe. These stages are not so much discrete stages as they are points on a continuum of severity. These stages govern the types of behavior you can expect, the level of care someone needs, and necessary supports.I think you are currently in the moderate stage. In the moderate stage, individuals with dementia eventually begin to have increasing difficulties with more basic activities (e.g., light housework, assembling a balanced meal, caring for personal hygiene and appearance). Individuals in the moderate stages also sometimes develop delusions (I.e., false beliefs), such as thinking that they have been visited by family members who are deceased, that their house is not their home, and the like. Individuals at this level of progression are not safe to operate a motor vehicle, should not be significantly involved in financial or medical decisions other than expressing their preferences, and should not be left alone for extended periods of time. They should not be left alone at all if there are problematic behaviors such as wandering. Individuals in the severe stage require extensive care and assistance, such as around the clock supervision or a skilled nursing level facility.   I am worried about you self-administering your medications and would recommend that you arrange for professional help with medications and/or use a tamper proof electronic pill minder with supervision by one of your care providers. This includes over the counter medications.  While medications can sometimes be helpful, oftentimes common sense, patience, and  knowledge are the best tools for managing problematic behaviors in dementia. Every individual responds differently, but there are some general recommendations that may be helpful.   Provide assistance as needed with memory-dependent or other cognitively challenging activities (e.g., doctors appointments, medications, planning trips, complex chores, paperwork or other forms).   Use simple, clear words that are easy to understand. Simplify information, be clear and concise.   Provide reassurance rather than trying to reason or give explanations.   Suggest the word that the person may be looking for.   Help simplify steps of activities in daily life, such as writing down steps of a task. Eliminate tasks that are difficult and frustrating.   Give instructions one step at a time.   Consistency, praise, and encouragement are very important; do not use criticism or punishment.   Agitated or catastrophic reactions are often a reaction to something in the environment or task difficulty/failure; know functional limits; when noticing the person getting upset, help redirect them away from the task and/or move to a different environment.   Often times, behavioral disturbance is as much due to understimulation as it is overstimulation. I would therefore recommend that your caregivers work to develop an activity schedule for you, such as crafts, coloring, word search, or other activities that can occupy your time. I would also recommend that you be taken out into the community as you are able, such as to museums, meals or other things.  Things like agitation, wandering, and anxiety can often be improved or eliminated using the "three R's." Redirection (help distract your loved one by focusing their attention on something else, moving them to a new environment, or otherwise engaging them in something other than what is distressing to them), Reassurance (reassure them that you are there to take care of them and  that there is nothing they need to be worried about), and Reconsidering (consider the situation from their perspective and try to identify if there is something about the situation or environment that may be triggering their reaction).  We discussed an elder lawyer, which could help with advance planning. Consultation with an elder care attorney can help you protect your estate and communicate your preferences to your loved ones formally, in the event you are not able to do so yourself. I can provide my strong recommendation for the [employer], who are located at [address] in [place], [place]. They can be reached at [phone]. They also have a website [email or web address].

## 2021-01-20 NOTE — Progress Notes (Signed)
[facility]  Patient Name: [name] MRN: [id number] Date of Birth: [birth year] Age: 75 y.o. 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 She was under the care of her husband until his placement in a nursing facility about 2 years ago, her son has been more involved since then, and he feels like he is seeing some decline. She has been quite impaired for some time, although he notices she is more "ornery" in the evenings, has a harder time self-administering her medications, and they are interested in anything can be done. She was previously on topamax, which has been weaned, and they have noticed some improvement. She has an MRI that shows biparietal areas of encephalomalacia/gliosis (chronic), mild to moderate volume loss with relatively more in the frontal lobes (unchanged over the past decade), and some mild+ leukoaraiosis.   On neuropsychological assessment, Ms. Mccrory demonstrated high average prior abilities on single word reading and performance falling well below that on neuropsychological assessment, with an overall unusually low level of cognitive function as per the RBANS total scaled score. She had very notable difficulties on measures of executive abilities and her memory profile is also consistent with significant executive confound (I.e., much better learning and retention for structured as compared to unstructured information, better delayed recognition than free recall). There does not appear to be a frank memory storage problem. She did well on measures of naming and semantic fluency. She screened positive for the presence of depression, reporting a  very high level of symptoms. Her son characterized her as functioning at a moderate dementia level, which is consistent with my CDR rating.   Ms. Kunin is thus demonstrating a major neurocognitive disorder currently at a moderate level of progression. If anything, her test data are a bit better than might be expected given her functional status. She had the most difficulties on measures of frontal-subcortical abilities and had preserved scores on measures viewed as most specific to Alzheimer's. My sense is that her difficulties are likely longstanding and the "worsening" noted reflects ongoing brain changes in the setting of aging, medication side effects, interference from depression and other factors as opposed to a separate neurodegenerative condition. In terms of her "baseline" level of impairment, hypoxic-ischemic brain injury seems the most likely given reported timeline and there may also be a vascular contribution. Recommend the use of brain sparing therapies in place of medications such as high-dose opiates, which can contribute to cognitive problems. She should not be self-administering her own medications and she should either have professional assistance, help from family members, or an automatic tamper-proof pill dispenser. I think her agitation would be decreased if she had more stimulating activities, such as outings, crafts, or other activities to engage in, as such behaviors can often be a sign of under stimulation.   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 You had the most difficulties on measures of executive abilities, which involve things like judgment, problem solving, control of attention  and the like. You had  relatively better performance on measures that tend to be characteristic of Alzheimer's clinical syndrome. I think that your major neurocognitive disorder is mostly due to your previous hypoxic/ischemic brain injury and that the "worsening" noted by your family reflects interfering effects from your medications, interference related to ongoing depression, and other factors as opposed to a separate neurodegenerative illness.   Dementia refers to a group of syndromes where multiple areas of ability are damaged in the brain, such as memory, thinking, judgment, and behavior, and most commonly refers to age related causes of dementia that cause worsening in these abilities over time. Alzheimer's disease is the most common form of dementia in people over the age of 53. Not all dementias are Alzheimer's disease, but all Alzheimer's disease is dementia. When dementia is due to an underlying condition affecting the brain, such as Alzheimer's disease, there is progression over time, which typically proceeds gradually over many years.   In your case, I think that your dementia is due to hypoxic-ischemic brain damage (I.e., not enough oxygen to the brain), sustained 12 years ago. This means that the condition does not necessarily worsen over time, although of course aging in the setting of a preexisting major neurocognitive disorder can cause decline, albeit not in the same way that we see in progressive degenerative disorders.   I think that there are numerous issues that need to be attended to that are contributing to the agitation and behavior problems your son and caregivers are noticing.  First, you are on very high doses of opiate medications. I suggest that your family work with your prescribing providers to minimize the use of brain impairing therapies and maximize the use of brain sparing therapies if at all possible.   Dementia is (somewhat arbitrarily) divided up into three different  stages: Mild, Moderate, and Severe. These stages are not so much discrete stages as they are points on a continuum of severity. These stages govern the types of behavior you can expect, the level of care someone needs, and necessary supports.I think you are currently in the moderate stage. In the moderate stage, individuals with dementia eventually begin to have increasing difficulties with more basic activities (e.g., light housework, assembling a balanced meal, caring for personal hygiene and appearance). Individuals in the moderate stages also sometimes develop delusions (I.e., false beliefs), such as thinking that they have been visited by family members who are deceased, that their house is not their home, and the like. Individuals at this level of progression are not safe to operate a motor vehicle, should not be significantly involved in financial or medical decisions other than expressing their preferences, and should not be left alone for extended periods of time. They should not be left alone at all if there are problematic behaviors such as wandering. Individuals in the severe stage require extensive care and assistance, such as around the clock supervision or a skilled nursing level facility.   I am worried about you self-administering your medications and would recommend that you arrange for professional help with medications and/or use a tamper proof electronic pill minder with supervision by one of your care providers. This includes over the counter medications.   While medications can sometimes be helpful, oftentimes common sense, patience, and knowledge are the best tools for managing problematic behaviors in dementia. Every individual responds differently, but there are some general recommendations that may be helpful.  . Provide assistance as needed with memory-dependent or other cognitively challenging activities (e.g., doctors appointments, medications, planning  trips, complex chores,  paperwork or other forms).  . Use simple, clear words that are easy to understand. Simplify information, be clear and concise.  Marland Kitchen Provide reassurance rather than trying to reason or give explanations.  . Suggest the word that the person may be looking for.  Marland Kitchen Help simplify steps of activities in daily life, such as writing down steps of a task. Eliminate tasks that are difficult and frustrating.  . Give instructions one step at a time.  . Consistency, praise, and encouragement are very important; do not use criticism or punishment.  . Agitated or catastrophic reactions are often a reaction to something in the environment or task difficulty/failure; know functional limits; when noticing the person getting upset, help redirect them away from the task and/or move to a different environment.   Often times, behavioral disturbance is as much due to understimulation as it is overstimulation. I would therefore recommend that your caregivers work to develop an activity schedule for you, such as crafts, coloring, word search, or other activities that can occupy your time. I would also recommend that you be taken out into the community as you are able, such as to museums, meals or other things.  Things like agitation, wandering, and anxiety can often be improved or eliminated using the "three R's." Redirection (help distract your loved one by focusing their attention on something else, moving them to a new environment, or otherwise engaging them in something other than what is distressing to them), Reassurance (reassure them that you are there to take care of them and that there is nothing they need to be worried about), and Reconsidering (consider the situation from their perspective and try to identify if there is something about the situation or environment that may be triggering their reaction).  Test Findings  Test scores are summarized in additional documentation associated with this encounter. Test scores  are relative to age, gender, and educational history as available and appropriate. There were no concerns about performance validity.   General Intellectual Functioning/Achievement:  Performance on single word reading was high average, which presents as a reasonable standard of comparison for cognitive test data.   Attention and Processing Efficiency: Indicators of attention fell at an extremely low level, overall. She performed well on a measure of digit repetition with an average score but timed number-symbol coding was extremely low. Qualitatively, there was stimulus bound "frontal" behavior on the latter indicator.   Language: Performance on visual object confrontation naming was normal. On timed fluency measures, she performed at an extremely low level on generation of words in response to the letters F-A-S whereas category fluency was low average for animals. Category fluency was unusually low for fruits and vegetables, although some of that may be on the basis of processing speed.   Visuospatial Function: Indicators of visuospatial and perceptual abilities was low average overall, representing an area of strength for this patient. She performed at an average level on figure copy but was very slow. Judgment of angular line orientations was unusually low.   Learning and Memory: Performance on measures of learning and memory was mixed. The profile shows difficulties with encoding and free delayed recall of unstructured information and much better performance with structured information, which is often associated with executive interference. She does not appear to have a complete storage problem and did retain some information across time.   In the verbal realm, Ms. Infantino demonstrated extremely low learning for a 10-item word list but her learning for a  short story was average. Delayed free recall for the word list was extremely low but delayed recognition was average. Delayed recall for the short  story was unusually low.   In the visual realm, delayed recall for a modestly complex figure stimulus was unusually low.   Executive Functions: Performance on executive indicators was low. She demonstrated extremely low alternating sequencing of numbers and letters, generation of words in response to the letters F-A-S, although she did do better and within the average range when reasoning with verbal information on the Complex Ideational Material. Clock drawing was "borderline" with very sloppy appearance and major errors in spatial arrangement of the numbers, although the clock hands were at appropriate locations.   Rating Scale(s): Ms. Tunstill screened positive for the presence of depression and reported a very high level of symptoms. Her son characterized her as functioning at a moderate dementia level, which is concordant with her CDR rating.   Viviano Simas [name], PsyD, ABN Clinical Neuropsychologist  Coding and Compliance  Billing below reflects technician time, my direct face-to-face time with the patient, time spent in test administration, and time spent in professional activities including but not limited to: neuropsychological test interpretation, integration of neuropsychological test data with clinical history, report preparation, treatment planning, care coordination, and review of diagnostically pertinent medical history or studies.   Services associated with this encounter: Clinical Interview [id number]) plus 175 minutes (96132/96133; Neuropsychological Evaluation by Professional)  23 minutes (96136/96137; Test Administration by Professional) 80 minutes (96138/96139; Neuropsychological Testing by Technician)

## 2021-01-21 ENCOUNTER — Encounter: Payer: Self-pay | Admitting: Counselor

## 2021-01-21 NOTE — Progress Notes (Signed)
[facility]  Feedback Note: I met with [name] to review the findings resulting from her neuropsychological evaluation. Since the last appointment, she has been about the same. Time was spent reviewing the impressions and recommendations that are detailed in the evaluation report. We reviewed her diagnosis, I was candid that I do not think there is a separate condition and that changes represent behavioral decompensation and/or interfering effects of depression and medication. We discussed behavior management strategies for dementia at length. Her son is clearly a caring caregiver, he has gone to great lengths to keep her safe and healthy. She often times resists his care (she spent a significant portion of the appointment upset that he is "taking away" her medications, because he limits her aspirin to some extent so she does not overdose). We also discussed that these sorts of problems at home can be a sign living at home is no longer feasible. Referred him to [facility] for possible elder day program. Other topics of discussion as reflected in the patient instructions. I took time to explain the findings and answer all the patient's questions. I encouraged Ms. Mikkelsen to contact me should she have any further questions or if further follow up is desired.   Current Medications and Medical History   Current Outpatient Medications  Medication Sig Dispense Refill  . aspirin 325 MG tablet Take 325 mg by mouth daily.    Marland Kitchen levothyroxine (SYNTHROID) 100 MCG tablet Take 1 tablet (100 mcg total) by mouth daily. 90 tablet 0  . mupirocin cream (BACTROBAN) 2 % Apply 1 application topically 2 (two) times daily. 30 g 2  . oxyCODONE (ROXICODONE) 15 MG immediate release tablet Take 1 tablet by mouth. May take up to 5 times daily    . pregabalin (LYRICA) 150 MG capsule TAKE 1 CAPSULE BY MOUTH TWICE DAILY 180 capsule 0  . ramelteon (ROZEREM) 8 MG tablet Take 1  tablet by mouth daily.    Marland Kitchen sulfamethoxazole-trimethoprim (BACTRIM) 400-80 MG tablet Take 1 tablet by mouth 2 (two) times daily. 20 tablet 0  . traZODone (DESYREL) 50 MG tablet Take 50-150 mg at bedtime as needed by mouth for sleep (and may take one additional 50 mg tablet daily AS NEEDED for part of a "headache cocktail").     . triamcinolone (KENALOG) 0.025 % cream Apply 1 application topically 2 (two) times daily. 80 g 2  . Ubrogepant (UBRELVY) 50 MG TABS Take 50 mg by mouth as needed (may repeat dose in 2 hours, not to exceed 100 mg/day, do not take more than 8 days a month). 10 tablet 11  . venlafaxine (EFFEXOR) 37.5 MG tablet Take 1 tablet (37.5 mg total) by mouth 2 (two) times daily. 180 tablet 1  . vortioxetine HBr (TRINTELLIX) 20 MG TABS tablet Take 20 mg by mouth daily.     No current facility-administered medications for this visit.    Patient Active Problem List   Diagnosis Date Noted  . Folliculitis 95/63/8756  . Rash 12/27/2020  . Abnormal weight loss 10/22/2020  . Morbid obesity ([place]) 10/22/2020  . Nausea 10/22/2020  . Long-term current use of opiate analgesic 12/01/2019  . Encounter for Medicare annual wellness exam 12/25/2018  . Chronic back pain 09/05/2018  . 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 History of gastrointestinal hemorrhage 09/05/2018  . Insomnia 09/05/2018  . Injury of left foot 09/05/2018  . Chronic low back pain 05/07/2018  . Cognitive impairment 05/07/2018  . Age related osteoporosis  03/12/2018  . On long term drug therapy 09/07/2017  . Left-sided chest wall pain 07/25/2017  . Hypotension 01/22/2017  . Cerebrovascular accident (CVA) due to thrombosis of precerebral artery ([place]) 12/05/2016  . Neck pain 12/05/2016  . Gait abnormality 12/05/2016  . Paronychia of finger of right hand 08/22/2016  . Osteoarthritis of right knee 11/22/2015  . Right knee pain 10/14/2015  . Actinic keratoses 07/02/2015  . Injury of right hand 04/27/2014  . Chest wall  tenderness 04/27/2014  . Foot drop, left 04/14/2014  . Traumatic injury of lower extremity 03/25/2014  . Obesity (BMI 30.0-34.9) 01/30/2014  . Healthcare maintenance 01/30/2014  . 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  . Diastolic CHF ([place]) 15/94/7076  . Fibromyalgia 08/14/2012  . Numbness in feet 08/08/2012  . GERD with stricture 03/04/2012  . Leg edema 03/04/2012  . CATARACT, LEFT EYE 08/23/2010  . Abnormality of gait 08/23/2010  . Osteoarthritis 02/24/2010  . Iron deficiency anemia 03/06/2009  . 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 SYMPTOM, INCONTINENCE W/O SENSORY AWARENESS 12/26/2006  . Memory loss 12/26/2006  . Hypothyroidism 11/01/2006  . DEPRESSION, MAJOR, RECURRENT 11/01/2006  . PANIC ATTACKS 11/01/2006  . Migraine headache 11/01/2006  . Carpal tunnel syndrome 11/01/2006  . 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 Irritable bowel syndrome 11/01/2006  . OSTEOPENIA 11/01/2006    Mental Status and Behavioral Observations  TRENACE COUGHLIN presented on time to the present encounter and was alert and generally oriented. Speech was normal in rate, rhythm, volume, and prosody. Self-reported mood was "fine" and affect was labile, ranging from pleasant and euthymic to dramatic and tearful. Thought process was tangential and thought content was often loosely related to the topics discussed. There were no safety concerns identified at today's encounter, such as thoughts of harming self or others.   Plan  Feedback provided regarding the patient's neuropsychological evaluation. Her son will discuss her medication regimen with orthopedics, he is aware of behavioral management strategies. Reinforced his decision to limit her access to medication and also suggested that they provide more opportunities for her to get out of the house. They will look into day programs. [name] was encouraged to contact me if any questions arise or if further follow up is desired.   Viviano Simas  [name], PsyD, ABN Clinical Neuropsychologist  Service(s) Provided at This Encounter: 42 minutes [phone]; Conjoint therapy with patient present)

## 2021-01-24 ENCOUNTER — Other Ambulatory Visit: Payer: Self-pay | Admitting: Neurology

## 2021-01-24 ENCOUNTER — Encounter: Payer: Self-pay | Admitting: *Deleted

## 2021-01-24 NOTE — Telephone Encounter (Signed)
Sent my chart asking how she is doing on Lyrica 150 mg twice daily. Last office note stated may decrease to 100 mg twice daily.

## 2021-02-25 ENCOUNTER — Ambulatory Visit: Payer: Medicare Other | Admitting: Physician Assistant

## 2021-03-01 DIAGNOSIS — G894 Chronic pain syndrome: Secondary | ICD-10-CM | POA: Diagnosis not present

## 2021-03-01 DIAGNOSIS — Z79891 Long term (current) use of opiate analgesic: Secondary | ICD-10-CM | POA: Diagnosis not present

## 2021-03-01 DIAGNOSIS — Z5181 Encounter for therapeutic drug level monitoring: Secondary | ICD-10-CM | POA: Diagnosis not present

## 2021-03-01 DIAGNOSIS — M545 Low back pain, unspecified: Secondary | ICD-10-CM | POA: Diagnosis not present

## 2021-03-04 ENCOUNTER — Ambulatory Visit (INDEPENDENT_AMBULATORY_CARE_PROVIDER_SITE_OTHER): Payer: Medicare Other | Admitting: Physician Assistant

## 2021-03-04 ENCOUNTER — Other Ambulatory Visit: Payer: Self-pay

## 2021-03-04 ENCOUNTER — Encounter: Payer: Self-pay | Admitting: Physician Assistant

## 2021-03-04 VITALS — BP 129/91 | HR 101 | Temp 98.1°F | Ht 63.0 in | Wt 219.0 lb

## 2021-03-04 DIAGNOSIS — R21 Rash and other nonspecific skin eruption: Secondary | ICD-10-CM

## 2021-03-04 DIAGNOSIS — E039 Hypothyroidism, unspecified: Secondary | ICD-10-CM | POA: Diagnosis not present

## 2021-03-04 DIAGNOSIS — Z0000 Encounter for general adult medical examination without abnormal findings: Secondary | ICD-10-CM

## 2021-03-04 DIAGNOSIS — Z780 Asymptomatic menopausal state: Secondary | ICD-10-CM | POA: Diagnosis not present

## 2021-03-04 DIAGNOSIS — R7303 Prediabetes: Secondary | ICD-10-CM | POA: Diagnosis not present

## 2021-03-04 DIAGNOSIS — Z1231 Encounter for screening mammogram for malignant neoplasm of breast: Secondary | ICD-10-CM | POA: Diagnosis not present

## 2021-03-04 DIAGNOSIS — I959 Hypotension, unspecified: Secondary | ICD-10-CM | POA: Diagnosis not present

## 2021-03-04 DIAGNOSIS — M810 Age-related osteoporosis without current pathological fracture: Secondary | ICD-10-CM | POA: Diagnosis not present

## 2021-03-04 DIAGNOSIS — L739 Follicular disorder, unspecified: Secondary | ICD-10-CM

## 2021-03-04 MED ORDER — MUPIROCIN CALCIUM 2 % EX CREA: 1.0000 "application " | TOPICAL_CREAM | Freq: Two times a day (BID) | CUTANEOUS | 2 refills | Status: DC

## 2021-03-04 MED ORDER — CALAMINE EX LOTN: 1.0000 "application " | TOPICAL_LOTION | CUTANEOUS | 0 refills | Status: DC | PRN

## 2021-03-04 NOTE — Patient Instructions (Signed)
Preventive Care 77 Years and Older, Female Preventive care refers to lifestyle choices and visits with your health care provider that can promote health and wellness. This includes: A yearly physical exam. This is also called an annual wellness visit. Regular dental and eye exams. Immunizations. Screening for certain conditions. Healthy lifestyle choices, such as: Eating a healthy diet. Getting regular exercise. Not using drugs or products that contain nicotine and tobacco. Limiting alcohol use. What can I expect for my preventive care visit? Physical exam Your health care provider will check your: Height and weight. These may be used to calculate your BMI (body mass index). BMI is a measurement that tells if you are at a healthy weight. Heart rate and blood pressure. Body temperature. Skin for abnormal spots. Counseling Your health care provider may ask you questions about your: Past medical problems. Family's medical history. Alcohol, tobacco, and drug use. Emotional well-being. Home life and relationship well-being. Sexual activity. Diet, exercise, and sleep habits. History of falls. Memory and ability to understand (cognition). Work and work Statistician. Pregnancy and menstrual history. Access to firearms. What immunizations do I need?  Vaccines are usually given at various ages, according to a schedule. Your health care provider will recommend vaccines for you based on your age, medicalhistory, and lifestyle or other factors, such as travel or where you work. What tests do I need? Blood tests Lipid and cholesterol levels. These may be checked every 5 years, or more often depending on your overall health. Hepatitis C test. Hepatitis B test. Screening Lung cancer screening. You may have this screening every year starting at age 44 if you have a 30-pack-year history of smoking and currently smoke or have quit within the past 15 years. Colorectal cancer screening. All  adults should have this screening starting at age 39 and continuing until age 65. Your health care provider may recommend screening at age 61 if you are at increased risk. You will have tests every 1-10 years, depending on your results and the type of screening test. Diabetes screening. This is done by checking your blood sugar (glucose) after you have not eaten for a while (fasting). You may have this done every 1-3 years. Mammogram. This may be done every 1-2 years. Talk with your health care provider about how often you should have regular mammograms. Abdominal aortic aneurysm (AAA) screening. You may need this if you are a current or former smoker. BRCA-related cancer screening. This may be done if you have a family history of breast, ovarian, tubal, or peritoneal cancers. Other tests STD (sexually transmitted disease) testing, if you are at risk. Bone density scan. This is done to screen for osteoporosis. You may have this done starting at age 54. Talk with your health care provider about your test results, treatment options,and if necessary, the need for more tests. Follow these instructions at home: Eating and drinking  Eat a diet that includes fresh fruits and vegetables, whole grains, lean protein, and low-fat dairy products. Limit your intake of foods with high amounts of sugar, saturated fats, and salt. Take vitamin and mineral supplements as recommended by your health care provider. Do not drink alcohol if your health care provider tells you not to drink. If you drink alcohol: Limit how much you have to 0-1 drink a day. Be aware of how much alcohol is in your drink. In the [place] one drink equals one 12 oz bottle of beer (355 mL), one 5 oz glass of wine (148 mL), or one 1  oz glass of hard liquor (44 mL).  Lifestyle Take daily care of your teeth and gums. Brush your teeth every morning and night with fluoride toothpaste. Floss one time each day. Stay active. Exercise for at  least 30 minutes 5 or more days each week. Do not use any products that contain nicotine or tobacco, such as cigarettes, e-cigarettes, and chewing tobacco. If you need help quitting, ask your health care provider. Do not use drugs. If you are sexually active, practice safe sex. Use a condom or other form of protection in order to prevent STIs (sexually transmitted infections). Talk with your health care provider about taking a low-dose aspirin or statin. Find healthy ways to cope with stress, such as: Meditation, yoga, or listening to music. Journaling. Talking to a trusted person. Spending time with friends and family. Safety Always wear your seat belt while driving or riding in a vehicle. Do not drive: If you have been drinking alcohol. Do not ride with someone who has been drinking. When you are tired or distracted. While texting. Wear a helmet and other protective equipment during sports activities. If you have firearms in your house, make sure you follow all gun safety procedures. What's next? Visit your health care provider once a year for an annual wellness visit. Ask your health care provider how often you should have your eyes and teeth checked. Stay up to date on all vaccines. This information is not intended to replace advice given to you by your health care provider. Make sure you discuss any questions you have with your healthcare provider. Document Revised: 08/11/2020 Document Reviewed: 08/15/2018 Elsevier Patient Education  2022 [employer].

## 2021-03-04 NOTE — Progress Notes (Signed)
Subjective:   [name] is a 75 y.o. female who presents for Medicare Annual (Subsequent) preventive examination.  Review of Systems    General:   No F/C, wt loss Pulm:   No DIB, SOB, pleuritic chest pain Card:  No CP, palpitations Abd:  No n/v/d or pain Ext:  No inc edema from baseline    Objective:    Today's Vitals   03/04/21 0929  BP: (!) 129/91  Pulse: (!) 101  Temp: 98.1 F (36.7 C)  SpO2: 91%  Weight: 219 lb 0.6 oz (99.4 kg)  Height: 5\' 3"  (1.6 m)   Body mass index is 38.8 kg/m.  Advanced Directives 10/15/2020 09/09/2020 04/30/2020 09/12/2016 08/22/2016 10/12/2015 07/02/2015  Does Patient Have a Medical Advance Directive? Yes Yes Yes No No No Yes;No  Type of Paramedic of [place];Living will Healthcare Power of [place] - - - -  Does patient want to make changes to medical advance directive? - No - Patient declined - - - Yes - information given -  Copy of [place] in Chart? - - No - copy requested - - - -  Would patient like information on creating a medical advance directive? - No - Patient declined - Yes (MAU/Ambulatory/Procedural Areas - Information given) - - No - patient declined information    Current Medications (verified) Outpatient Encounter Medications as of 03/04/2021  Medication Sig   aspirin 325 MG tablet Take 325 mg by mouth daily.   calamine lotion Apply 1 application topically as needed for itching.   levothyroxine (SYNTHROID) 100 MCG tablet Take 1 tablet (100 mcg total) by mouth daily.   oxyCODONE (ROXICODONE) 15 MG immediate release tablet Take 1 tablet by mouth. May take up to 5 times daily   pregabalin (LYRICA) 150 MG capsule TAKE 1 CAPSULE BY MOUTH Two TIMES DAILY, new dosing   ramelteon (ROZEREM) 8 MG tablet Take 1 tablet by mouth daily.   sulfamethoxazole-trimethoprim (BACTRIM) 400-80 MG tablet Take 1 tablet by mouth 2 (two) times daily.   traZODone (DESYREL) 50 MG tablet  Take 50-150 mg at bedtime as needed by mouth for sleep (and may take one additional 50 mg tablet daily AS NEEDED for part of a "headache cocktail").    triamcinolone (KENALOG) 0.025 % cream Apply 1 application topically 2 (two) times daily.   Ubrogepant (UBRELVY) 50 MG TABS Take 50 mg by mouth as needed (may repeat dose in 2 hours, not to exceed 100 mg/day, do not take more than 8 days a month).   venlafaxine (EFFEXOR) 37.5 MG tablet Take 1 tablet (37.5 mg total) by mouth 2 (two) times daily.   vortioxetine HBr (TRINTELLIX) 20 MG TABS tablet Take 20 mg by mouth daily.   [DISCONTINUED] mupirocin cream (BACTROBAN) 2 % Apply 1 application topically 2 (two) times daily.   mupirocin cream (BACTROBAN) 2 % Apply 1 application topically 2 (two) times daily.   No facility-administered encounter medications on file as of 03/04/2021.    Allergies (verified) Bupropion hcl, Dairy aid [lactase], Other, Azithromycin, and Erythromycin   History: Past Medical History:  Diagnosis Date   Anemia    took Fe- orally 2 yrs. 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    HERNIA REPAIR     ORIF ANKLE FRACTURE Right 2004   REPLACEMENT TOTAL KNEE     REVERSE SHOULDER ARTHROPLASTY  Right 05/16/2013   Procedure: RIGHT HEMI ARTHROPLASTY  VERSES  REVERSE TOTAL SHOULDER ARTHROPLASTY ;  Surgeon: Augustin Schooling, MD;  Location: [facility];  Service: Orthopedics;  Laterality: Right;   SPINE SURGERY  2005   Lumbar spinal stenosis    STOMACH SURGERY     1/3 resection for obstruction   TONSILLECTOMY     TUBAL LIGATION     Family History  Problem Relation Age of Onset   Aneurysm Mother    Heart disease Mother    Cancer Father        pancreatic   Stroke Father 69   Hypertension Father    Alcohol abuse Father    Heart disease Maternal Grandfather    Social History   Socioeconomic History   Marital status: Married    Spouse name: Not on file   Number of children: 1   Years of education: College   Highest education level: Not on file  Occupational History   Occupation: Retired  Tobacco Use   Smoking status: Never   Smokeless tobacco: Never  Vaping Use   Vaping Use: Never used  Substance and Sexual Activity   Alcohol use: Yes    Comment: rare use of white wine    Drug use: No   Sexual activity: Not on file  Other Topics Concern   Not on file  Social History Narrative   Lives at home with her husband.   2 cups caffeine daily.   Right-handed.   Social Determinants of Health   Financial Resource Strain: Not on file  Food Insecurity: Not on file  Transportation Needs: Not on file  Physical Activity: Not on file  Stress: Not on file  Social Connections: Not on file    Tobacco Counseling Counseling given: Not Answered    Diabetic? No    Activities of Daily Living In your present state of health, do you have any difficulty performing the following activities: 12/27/2020 10/22/2020  Hearing? Y N  Vision? N Y  Difficulty concentrating or making decisions? Y N  Walking or climbing stairs? Y Y  Dressing or bathing? Y Y  Doing errands, shopping? Tempie Donning  Some recent data might be hidden    Patient Care Team: Lorrene Reid, PA-C as PCP - General  (Physician Assistant) Ileene Patrick, LCSW as Social Worker (Licensed Clinical Social Worker) Chucky May, MD as Consulting Physician (Psychiatry) Donzetta Sprung., MD as Physician Assistant (Sports Medicine) Margaretmary Lombard, PhD as Referring Physician (Psychology) Juanita Craver, MD as Consulting Physician (Gastroenterology) Cameron Sprang, MD as Consulting Physician (Neurology)  Indicate any recent [place] you  may have received from other than [facility] providers in the past year (date may be approximate).     Assessment:   This is a routine wellness examination for [name].  Hearing/Vision screen No results found.  Dietary issues and exercise activities discussed:  -Follow a heart healthy diet low in fat, include whole grains, fruits and vegetables. Exercise is limited by chronic pain and deconditioning. Agree with Neurology recommendations for [name] for elder day program.   Goals Addressed   None   Depression Screen PHQ 2/9 Scores 12/27/2020 11/15/2020 10/22/2020 07/02/2020 06/25/2020 03/19/2020 12/26/2019  PHQ - 2 Score 3 2 0 0 1 2 4   PHQ- 9 Score 13 17 6 4 12 10 17     Fall Risk Fall Risk  12/27/2020 11/15/2020 10/22/2020 10/15/2020 07/02/2020  Falls in the past year? 1 0 1 1 1   Number falls in past yr: 0 0 0 0 1  Comment - - - - -  Injury with Fall? - - 1 1 1   Risk Factor Category  - - - - -  Risk for fall due to : - No Fall Risks History of fall(s);Impaired balance/gait - History of fall(s);Impaired balance/gait;Impaired mobility  Follow up Falls evaluation completed - - - Falls evaluation completed    FALL RISK PREVENTION PERTAINING TO THE HOME:  Any stairs in or around the home? Yes  If so, are there any without handrails? Yes  Home free of loose throw rugs in walkways, pet beds, electrical cords, etc? Yes  Adequate lighting in your home to reduce risk of falls? Yes   ASSISTIVE DEVICES UTILIZED TO PREVENT FALLS:  Life alert? No  Use of a  cane, walker or w/c? Yes  Grab bars in the bathroom? Yes  Shower chair or bench in shower? Yes  Elevated toilet seat or a handicapped toilet? Yes   TIMED UP AND GO:  Was the test performed? Yes .  Length of time to ambulate 10 feet: 14 sec.   Gait unsteady with use of assistive device, provider informed and education provided.   Cognitive Function: patient's baseline MMSE - Mini Mental State Exam 01/12/2021 05/07/2018 06/11/2017 11/06/2016  Orientation to time 1 1 1 3   Orientation to Place 2 4 4 4   Registration 3 3 3 3   Attention/ Calculation 1 2 3 2   Recall 2 2 2 2   Language- name 2 objects 2 2 2 2   Language- repeat 1 1 1 1   Language- follow 3 step command 3 3 3 3   Language- read & follow direction 1 1 1 1   Write a sentence 1 1 1 1   Copy design 1 1 1  0  Total score 18 21 22 22    Montreal Cognitive Assessment  10/15/2020  Visuospatial/ Executive (0/5) 3  Naming (0/3) 3  Attention: Read list of digits (0/2) 2  Attention: Read list of letters (0/1) 1  Attention: Serial 7 subtraction starting at 100 (0/3) 0  Language: Repeat phrase (0/2) 2  Language : Fluency (0/1) 0  Abstraction (0/2) 2  Delayed Recall (0/5) 0  Orientation (0/6) 3  Total 16  Adjusted Score (based on education) 16   6CIT Screen 03/04/2021 12/25/2018  What Year? 0 points 4 points  What month? 0 points 3 points  What time? 3 points 3 points  Count back from 20 2 points 0 points  Months in reverse 0 points 4 points  Repeat phrase 4 points 8 points  Total Score 9 22    Immunizations  Immunization History  Administered Date(s) Administered   Fluad Quad(high Dose 65+) 07/18/2019   Influenza Whole 06/10/2007, 06/03/2009, 07/15/2010   Influenza, High Dose Seasonal PF 11/12/2017   Influenza,inj,Quad PF,6+ Mos 09/17/2014, 07/02/2015, 08/22/2016   PFIZER Comirnaty(Gray Top)Covid-19 Tri-Sucrose Vaccine 12/28/2020   Pneumococcal Conjugate-13 07/02/2015   Pneumococcal Polysaccharide-23 08/11/2010   Td 02/03/2003     TDAP status: Due, Education has been provided regarding the importance of this vaccine. Advised may receive this vaccine at local pharmacy or Health Dept. Aware to provide a copy of the vaccination record if obtained from local pharmacy or Health Dept. Verbalized acceptance and understanding.  Flu Vaccine status: Up to date  Pneumococcal vaccine status: Up to date  Covid-19 vaccine status: Completed vaccines  Qualifies for Shingles Vaccine? No   Zostavax completed No   Shingrix Completed?: No.    Education has been provided regarding the importance of this vaccine. Patient has been advised to call insurance company to determine out of pocket expense if they have not yet received this vaccine. Advised may also receive vaccine at local pharmacy or Health Dept. Verbalized acceptance and understanding.  Screening Tests Health Maintenance  Topic Date Due   Zoster Vaccines- Shingrix (1 of 2) Never done   TETANUS/TDAP  02/02/2013   COVID-19 Vaccine (2 - Pfizer risk series) 01/18/2021   INFLUENZA VACCINE  04/04/2021   DEXA SCAN  Completed   Hepatitis C Screening  Completed   PNA vac Low Risk Adult  Completed   HPV VACCINES  Aged Out    Health Maintenance  Health Maintenance Due  Topic Date Due   Zoster Vaccines- Shingrix (1 of 2) Never done   TETANUS/TDAP  02/02/2013   COVID-19 Vaccine (2 - Pfizer risk series) 01/18/2021    Colorectal: Pt declined  Mammogram status: No longer required due to age.  Dexa: Pt initially declined and after discussion agreeable to bone density.  Lung Cancer Screening: (Low Dose CT Chest recommended if Age 71-80 years, 30 pack-year currently smoking OR have quit w/in 15years.) does not qualify.   Lung Cancer Screening Referral:   Additional Screening:  Hepatitis C Screening: does qualify; Completed 07/02/2015  Vision Screening: Recommended annual ophthalmology exams for early detection of glaucoma and other disorders of the eye. Is the patient  up to date with their annual eye exam?  Yes  Who is the provider or what is the name of the office in which the patient attends annual eye exams? Doesn't remember  If pt is not established with a provider, would they like to be referred to a provider to establish care? No .   Dental Screening: Recommended annual dental exams for proper oral hygiene  Community Resource Referral / Chronic Care Management: CRR required this visit?  No   CCM required this visit?  No      Plan:  -Continue to follow up with various specialists. -Will obtain labs today. -Follow up in 4 months for reg OV: thyroid, mood, prediabetes  I have personally reviewed and noted the following in the patient's chart:   Medical and social history Use of alcohol, tobacco or illicit drugs  Current medications and supplements including opioid prescriptions.  Functional ability and status Nutritional status Physical activity Advanced directives List of other physicians Hospitalizations, surgeries, and ER visits in previous 12 months Vitals Screenings to include cognitive, depression, and falls Referrals and appointments  In addition, I have reviewed and discussed with patient certain preventive protocols, quality metrics, and best practice recommendations. A  written personalized care plan for preventive services as well as general preventive health recommendations were provided to patient.

## 2021-03-05 LAB — CBC
Hematocrit: 37.9 % (ref 34.0–46.6)
Hemoglobin: 11.1 g/dL (ref 11.1–15.9)
MCH: 22.9 pg — ABNORMAL LOW (ref 26.6–33.0)
MCHC: 29.3 g/dL — ABNORMAL LOW (ref 31.5–35.7)
MCV: 78 fL — ABNORMAL LOW (ref 79–97)
Platelets: 277 10*3/uL (ref 150–450)
RBC: 4.84 x10E6/uL (ref 3.77–5.28)
RDW: 16.9 % — ABNORMAL HIGH (ref 11.7–15.4)
WBC: 8 10*3/uL (ref 3.4–10.8)

## 2021-03-05 LAB — COMPREHENSIVE METABOLIC PANEL
ALT: 6 IU/L (ref 0–32)
AST: 14 IU/L (ref 0–40)
Albumin/Globulin Ratio: 1.6 (ref 1.2–2.2)
Albumin: 4.2 g/dL (ref 3.7–4.7)
Alkaline Phosphatase: 86 IU/L (ref 44–121)
BUN/Creatinine Ratio: 13 (ref 12–28)
BUN: 13 mg/dL (ref 8–27)
Bilirubin Total: 0.3 mg/dL (ref 0.0–1.2)
CO2: 22 mmol/L (ref 20–29)
Calcium: 9.1 mg/dL (ref 8.7–10.3)
Chloride: 100 mmol/L (ref 96–106)
Creatinine, Ser: 0.97 mg/dL (ref 0.57–1.00)
Globulin, Total: 2.6 g/dL (ref 1.5–4.5)
Glucose: 54 mg/dL — ABNORMAL LOW (ref 65–99)
Potassium: 4.3 mmol/L (ref 3.5–5.2)
Sodium: 137 mmol/L (ref 134–144)
Total Protein: 6.8 g/dL (ref 6.0–8.5)
eGFR: 60 mL/min/{1.73_m2} (ref >59)

## 2021-03-05 LAB — LDL CHOLESTEROL, DIRECT: LDL Direct: 91 mg/dL (ref 0–99)

## 2021-03-05 LAB — HEMOGLOBIN A1C
Est. average glucose Bld gHb Est-mCnc: 117 mg/dL
Hgb A1c MFr Bld: 5.7 % — ABNORMAL HIGH (ref 4.8–5.6)

## 2021-03-05 LAB — TSH: TSH: 5.72 u[IU]/mL — ABNORMAL HIGH (ref 0.450–4.500)

## 2021-03-15 ENCOUNTER — Emergency Department (HOSPITAL_COMMUNITY): Payer: Medicare Other

## 2021-03-15 ENCOUNTER — Other Ambulatory Visit: Payer: Self-pay

## 2021-03-15 ENCOUNTER — Emergency Department (HOSPITAL_COMMUNITY)
Admission: EM | Admit: 2021-03-15 | Discharge: 2021-03-16 | Disposition: A | Discharge status: Home / Self Care | Payer: Medicare Other | Attending: Emergency Medicine | Admitting: Emergency Medicine

## 2021-03-15 DIAGNOSIS — Z7982 Long term (current) use of aspirin: Secondary | ICD-10-CM | POA: Insufficient documentation

## 2021-03-15 DIAGNOSIS — R0789 Other chest pain: Secondary | ICD-10-CM | POA: Diagnosis not present

## 2021-03-15 DIAGNOSIS — R0602 Shortness of breath: Secondary | ICD-10-CM | POA: Insufficient documentation

## 2021-03-15 DIAGNOSIS — R61 Generalized hyperhidrosis: Secondary | ICD-10-CM | POA: Diagnosis not present

## 2021-03-15 DIAGNOSIS — I10 Essential (primary) hypertension: Secondary | ICD-10-CM | POA: Diagnosis not present

## 2021-03-15 DIAGNOSIS — Z5321 Procedure and treatment not carried out due to patient leaving prior to being seen by health care provider: Secondary | ICD-10-CM

## 2021-03-15 DIAGNOSIS — R110 Nausea: Secondary | ICD-10-CM | POA: Diagnosis not present

## 2021-03-15 DIAGNOSIS — R079 Chest pain, unspecified: Secondary | ICD-10-CM | POA: Insufficient documentation

## 2021-03-15 LAB — BASIC METABOLIC PANEL
Anion gap: 10 (ref 5–15)
BUN: 14 mg/dL (ref 8–23)
CO2: 22 mmol/L (ref 22–32)
Calcium: 8.6 mg/dL — ABNORMAL LOW (ref 8.9–10.3)
Chloride: 100 mmol/L (ref 98–111)
Creatinine, Ser: 0.69 mg/dL (ref 0.44–1.00)
GFR, Estimated: >60 mL/min (ref >60)
Glucose, Bld: 102 mg/dL — ABNORMAL HIGH (ref 70–99)
Potassium: 4.7 mmol/L (ref 3.5–5.1)
Sodium: 132 mmol/L — ABNORMAL LOW (ref 135–145)

## 2021-03-15 LAB — CBC
HCT: 37.5 % (ref 36.0–46.0)
Hemoglobin: 10.9 g/dL — ABNORMAL LOW (ref 12.0–15.0)
MCH: 22.8 pg — ABNORMAL LOW (ref 26.0–34.0)
MCHC: 29.1 g/dL — ABNORMAL LOW (ref 30.0–36.0)
MCV: 78.5 fL — ABNORMAL LOW (ref 80.0–100.0)
Platelets: 198 10*3/uL (ref 150–400)
RBC: 4.78 MIL/uL (ref 3.87–5.11)
RDW: 18.6 % — ABNORMAL HIGH (ref 11.5–15.5)
WBC: 9.9 10*3/uL (ref 4.0–10.5)
nRBC: 0 % (ref 0.0–0.2)

## 2021-03-15 LAB — TROPONIN I (HIGH SENSITIVITY): Troponin I (High Sensitivity): 5 ng/L (ref <18)

## 2021-03-15 NOTE — ED Triage Notes (Signed)
Pt c/o centralized chest pain starting today 6 pm while at rest. Along with SOB, nausea, and diaphoresis. Denies pain in triage.

## 2021-03-15 NOTE — ED Triage Notes (Signed)
[facility] report: chest pain around 5:30 pm center of chest does not radiate anywhere. Pain 6/10. Given 324 ASA. Pain has now resided.

## 2021-03-15 NOTE — ED Provider Notes (Signed)
Emergency Medicine Provider Triage Evaluation Note  [name] , a 75 y.o. female  was evaluated in triage.  Pt complains of chest pain that started tonight around 6pm.  Symptoms lasted 45 minutes.  Associated nausea and SOB.  Takes a baby aspirin daily.  No other treatments PTA.  Denies any heart problems.  Review of Systems  Positive: CP (resolved now), SOB Negative: Fever, couigh  Physical Exam  BP (!) 134/59 (BP Location: Right Arm)   Pulse 63   Temp 98.1 F (36.7 C) (Oral)   Resp 18   SpO2 96%  Gen:   Awake, no distress   Resp:  Normal effort  MSK:   Moves extremities without difficulty  Other:    Medical Decision Making  Medically screening exam initiated at 10:13 PM.  Appropriate orders placed.  [name] was informed that the remainder of the evaluation will be completed by another provider, this initial triage assessment does not replace that evaluation, and the importance of remaining in the ED until their evaluation is complete.  Chest pain   Montine Circle, PA-C 03/15/21 [place], [place], DO 03/15/21 2317

## 2021-03-28 ENCOUNTER — Ambulatory Visit: Payer: Medicare Other | Admitting: Neurology

## 2021-04-20 ENCOUNTER — Other Ambulatory Visit: Payer: Self-pay | Admitting: Physician Assistant

## 2021-05-02 ENCOUNTER — Telehealth: Payer: Self-pay | Admitting: Neurology

## 2021-05-02 NOTE — Telephone Encounter (Signed)
Left message for patient letting her know additional refills should be on file at the pharmacy. Directed her to call them to request the medication. If she has any further needs, I provided our number to call back.

## 2021-05-02 NOTE — Telephone Encounter (Signed)
Pt request refill Ubrogepant (UBRELVY) 50 MG TABS at [facility]

## 2021-05-19 ENCOUNTER — Ambulatory Visit (INDEPENDENT_AMBULATORY_CARE_PROVIDER_SITE_OTHER): Payer: Medicare Other | Admitting: Neurology

## 2021-05-19 ENCOUNTER — Other Ambulatory Visit: Payer: Self-pay | Admitting: *Deleted

## 2021-05-19 ENCOUNTER — Encounter: Payer: Self-pay | Admitting: Neurology

## 2021-05-19 VITALS — BP 97/62 | HR 67 | Ht 64.0 in | Wt 219.0 lb

## 2021-05-19 DIAGNOSIS — R269 Unspecified abnormalities of gait and mobility: Secondary | ICD-10-CM

## 2021-05-19 DIAGNOSIS — F03918 Unspecified dementia, unspecified severity, with other behavioral disturbance: Secondary | ICD-10-CM | POA: Insufficient documentation

## 2021-05-19 DIAGNOSIS — G43709 Chronic migraine without aura, not intractable, without status migrainosus: Secondary | ICD-10-CM

## 2021-05-19 DIAGNOSIS — F0391 Unspecified dementia with behavioral disturbance: Secondary | ICD-10-CM

## 2021-05-19 MED ORDER — UBRELVY 50 MG PO TABS: 50.0000 mg | ORAL_TABLET | ORAL | 11 refills | Status: DC | PRN

## 2021-05-19 MED ORDER — AIMOVIG 70 MG/ML ~~LOC~~ SOAJ: 70.0000 mg | SUBCUTANEOUS | 11 refills | Status: DC

## 2021-05-19 MED ORDER — AJOVY 225 MG/1.5ML ~~LOC~~ SOAJ: 225.0000 mg | SUBCUTANEOUS | 11 refills | Status: DC

## 2021-05-19 MED ORDER — TIZANIDINE HCL 4 MG PO TABS
4.0000 mg | ORAL_TABLET | Freq: Four times a day (QID) | ORAL | 6 refills | Status: DC | PRN
Start: 2021-05-19 — End: 2022-11-27

## 2021-05-19 MED ORDER — ONDANSETRON 4 MG PO TBDP
4.0000 mg | ORAL_TABLET | Freq: Three times a day (TID) | ORAL | 6 refills | Status: DC | PRN
Start: 2021-05-19 — End: 2021-11-04

## 2021-05-19 NOTE — Telephone Encounter (Signed)
The patient was under the impression that Aimovig '70mg'$  was denied by insurance. Per covermymeds, there is a current approval for Aimovig '70mg'$  on file for 09/03/21. We actually need to void Ajovy and resend Aimovig '70mg'$ .   I spoke to her son that manages her medication. He has been informed of this information.

## 2021-05-19 NOTE — Patient Instructions (Addendum)
Meds ordered this encounter  Medications   Fremanezumab-vfrm (AJOVY) 225 MG/1.5ML SOAJ---as migraine prevention    Sig: Inject 225 mg into the skin every 30 (thirty) days.    Dispense:  1.68 mL    Refill:  11   Ubrogepant (UBRELVY) 50 MG TABS ----less than twice each week    Sig: Take 50 mg by mouth as needed (may repeat dose in 2 hours, not to exceed 100 mg/day, do not take more than 8 days a month).    Dispense:  12 tablet    Refill:  11    Do not refill in less than 30 days   ondansetron (ZOFRAN ODT) 4 MG disintegrating tablet    Sig: Take 1 tablet (4 mg total) by mouth every 8 (eight) hours as needed for nausea or vomiting.    Dispense:  20 tablet    Refill:  6     tiZANidine (ZANAFLEX) 4 MG tablet    Sig: Take 1 tablet (4 mg total) by mouth every 6 (six) hours as needed for muscle spasms.    Dispense:  30 tablet    Refill:  6       May combine ubrelvy, with zofran, aleve, zofran, tizanidine for severe prolonged headaches.

## 2021-05-19 NOTE — Progress Notes (Signed)
Assessment and Plan: [name] is a 75 y.o. female    Chronic migraine headaches  Frequent occurrence, Ajovy as preventive medication Try to limit Ubrelvy as needed  Dementia Depression anxiety  Slowly progressive, her polypharmacy likely contributed as well  Some improvement with adjusting her medications,  Neuropsychiatric evaluation by Dr. 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   She reported lifelong history of migraine headaches, her typical migraine are right side severe pounding headache with associated light noise sensitivity, nauseous, lasting for 3 days, she was previously treated by neurologist at [facility] with Lyrica 400 mg daily, tizanidine Frova as needed, oxycodone 20 mg 4 times a day,   Her physician changed at the  beginning of the year, she was tapered off oxycodone, since then, she complained of frequent headaches, about once a week severe right side migraine headache, lasting for 3 days, has to go to emergency room,   She has chronic gait abnormality due to right knee pain, she had right knee replacement, left knee severe arthritis disease.B12 level was 1300,   Update December 05 2016: Reviewed laboratory evaluation on November 06 2016,no significant abnormality, CMP, C-reactive protein, folic acid, negative RPR, We personally reviewed MRI of the brain without contrast on November 19 2016: Moderate cortical atrophy, no significant change compared to previous scan in 2011, chronic bilateral parietal lobe encephalomalacia, chronic hemo-product on the left side, chronic microvascular ischemic changes in the cerebral hemisphere, and in the left cerebellar hemisphere,   She is taking Diclofenac prn for her headaches. She complains almost daily bilateral retro-orbital area pressure pain, lasting for a few hours,   She also complains of increased, gradual worsening gait abnormality, urinary incontinence.   UPDATE Jun 11 2017: She complains of constant headache,  lateralized severe pounding headache, lasting for 2 days, she is now taking frequent almost daily diclofenac without much help, she is also taking Topamax 100 mg every day at nighttime as preventive medication, which did not make much difference,   She has tried BOTOX Injection in the past, without help, she is not a candidate for triptan treatment because evidence of previous stroke, multiple vascular risk factors   MRI of cervical in April 2018 showed evidence of multilevel degenerative changes, most severe at C4-5, C5-6, with evidence of disc bulging, severe bilateral foraminal narrowing   UPDATE Sept 3 2019: Her insurance denies  Amovig, she is now taking effexor 37.'5mg'$  bid, which did help her migraine some, she reported mild improvement, she is now having  migraine 2 times a week, sleeps helps her migraine, she is now taking oycodone '15mg'$  up to five times a day, diclofenac as needed.   She still has trouble with her memory. MMSE is 21/30 today.  UPDATE Oct 27 2019: She is accompanied by her [name] at today's visit, her migraine headache is overall doing very well, only has couple migraines each month, Roselyn Meier works well for her migraine,  She was noted to have gradual worsening memory loss, especially since her husband recent stay at nursing home, she has low back, knee hip pain, on polypharmacy treatment, including oxycodone 15 mg 4 times a day, Lyrica 150 mg 3 times a day, trazodone 50 mg at bedtime, Effexor, Trintellix, and Topamax 100 mg twice a day, she had a history of seizure, has not had recurrent seizure for years  Virtual Visit via Video Aug 10 2020: She is with her son at virtual visit, very sleepy, drifting to sleep multiple times, headache overall has much improved, only use Ubrelvy few times since February, responding well, she complains of diffuse body achy pain, multiple joints pain, taking Lyrica, 3 times a day, has slow worsening memory loss, gait abnormality, her husband went to nursing home,  Update May 19, 2021: Is accompanied by her son at today's clinical visit, her husband is still in skilled nursing, she has 24-hour care at home, able to ambulate with a walker, today her main concern is increasing migraine headaches, she was given Roselyn Meier as needed, works well for her headache, but she ended up finishing 10 tablets within 1 week,  She described her migraine often started with visual aura, visual changes, only rarely go to severe disabling headaches,  I also reviewed her evaluation by [facility] neurologist, neuropsychiatric evaluations, in May 2022, the conclusion was major neurocognitive disorder, 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  CRANIAL NERVES: CN II: Visual fields are full to confrontation.  Pupils are round equal and briskly reactive to light. CN III, IV, VI: extraocular movement are normal. No ptosis. CN V: Facial sensation is intact to light touch. CN VII: Face is symmetric with normal eye closure and smile. CN VIII: Hearing is normal to casual conversation CN IX, X: Palate elevates symmetrically. Phonation is normal. CN XI: Head turning and shoulder shrug are intact   MOTOR: Muscle bulk and tone are normal. Muscle strength is normal.  REFLEXES: Reflexes are 1  and symmetric at the biceps, triceps, knees and ankles. Plantar responses are flexor.  SENSORY: Intact to light touch, pinprick, positional and vibratory sensation at fingers and toes.  COORDINATION: There is no trunk or limb ataxia.    GAIT/STANCE: Need to push-up to get up from seated position, rely on her walker, mildly unsteady  Review of systems: 14 system review of system was performed, only relevant for above.  PAST MEDICAL HISTORY: Past Medical History:  Diagnosis Date   Anemia    took Fe- orally 2 yrs. 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   Depression    GERD (gastroesophageal reflux disease)    uses Protonix as needed    Headache(784.0)    Humerus fracture    Right    Hyposmolality and/or hyponatremia 02/27/2013   Hypothyroidism    IBS (irritable bowel syndrome)    Memory loss    Mental disorder    Migraine    Preoperative examination 02/26/2013   Seizures ([place])    caused by depression , seritonin seizure - effected short term memory     Shortness of breath     PAST SURGICAL HISTORY: Past Surgical History:   Procedure Laterality Date   ABDOMINAL HYSTERECTOMY     APPENDECTOMY     BACK SURGERY  1990's   BREAST SURGERY  1972   augmentation    CHOLECYSTECTOMY     GASTRECTOMY  1994   Due to ulcer, required multiple revisions.    HERNIA REPAIR     ORIF ANKLE FRACTURE Right 2004   REPLACEMENT TOTAL KNEE     REVERSE SHOULDER ARTHROPLASTY Right 05/16/2013   Procedure: RIGHT HEMI ARTHROPLASTY  VERSES  REVERSE TOTAL SHOULDER ARTHROPLASTY ;  Surgeon: Augustin Schooling, MD;  Location: [facility];  Service: Orthopedics;  Laterality: Right;   SPINE SURGERY  2005   Lumbar spinal stenosis    STOMACH SURGERY     1/3 resection for obstruction   TONSILLECTOMY     TUBAL LIGATION      MEDICATIONS: Current Outpatient Medications on File Prior to Visit  Medication Sig Dispense Refill   aspirin 325 MG tablet Take 325 mg by mouth daily.     calamine lotion Apply 1 application topically as needed for itching. 120 mL 0   levothyroxine (SYNTHROID) 100 MCG tablet TAKE 1 TABLET BY MOUTH DAILY 90 tablet 0   mupirocin cream (BACTROBAN) 2 % Apply 1 application topically 2 (two) times daily. 30 g 2   oxyCODONE (ROXICODONE) 15 MG immediate release tablet Take 1 tablet by mouth. May take up to 5 times daily     pregabalin (LYRICA) 150 MG capsule TAKE 1 CAPSULE BY MOUTH Two TIMES DAILY, new dosing 180 capsule 1   ramelteon (ROZEREM) 8 MG tablet Take 1 tablet by mouth daily.     traZODone (DESYREL) 50 MG tablet Take 50-150 mg at bedtime as needed by mouth for sleep (and may take one additional 50 mg tablet daily AS NEEDED for part of a "headache cocktail").      triamcinolone (KENALOG) 0.025 % cream Apply 1 application topically 2 (two) times daily. 80 g 2   Ubrogepant (UBRELVY) 50 MG TABS Take 50 mg by mouth as needed (may repeat dose in 2 hours, not to exceed 100 mg/day, do not take more than 8 days a month). 10 tablet 11   venlafaxine (EFFEXOR) 37.5 MG tablet Take 1 tablet (37.5 mg total) by mouth 2 (two) times daily. 180  tablet 1   vortioxetine HBr (TRINTELLIX) 20 MG TABS tablet Take 20 mg by mouth daily.     No current facility-administered medications on file prior to visit.    ALLERGIES: Allergies  Allergen Reactions   Bupropion Hcl Other (See Comments)    SEIZURES!!   Dairy Aid [Lactase] Diarrhea   Other    Azithromycin Rash   Erythromycin Rash    FAMILY HISTORY: Family History  Problem Relation Age of Onset   Aneurysm Mother    Heart disease Mother    Cancer Father  pancreatic   Stroke Father 67   Hypertension Father    Alcohol abuse Father    Heart disease Maternal Grandfather     SOCIAL HISTORY: Social History   Socioeconomic History   Marital status: Married    Spouse name: Not on file   Number of children: 1   Years of education: College   Highest education level: Not on file  Occupational History   Occupation: Retired  Tobacco Use   Smoking status: Never   Smokeless tobacco: Never  Vaping Use   Vaping Use: Never used  Substance and Sexual Activity   Alcohol use: Yes    Comment: rare use of white wine    Drug use: No   Sexual activity: Not on file  Other Topics Concern   Not on file  Social History Narrative   Lives at home with her husband.   2 cups caffeine daily.   Right-handed.   Social Determinants of Health   Financial Resource Strain: Not on file  Food Insecurity: Not on file  Transportation Needs: Not on file  Physical Activity: Not on file  Stress: Not on file  Social Connections: Not on file  Intimate Partner Violence: Not on file      Marcial Pacas, M.D. Ph.D.  [facility] [address] [place], [place] [address] Ph: [phone] Fax: [phone]  CC: Referring Provider

## 2021-05-23 ENCOUNTER — Telehealth: Payer: Self-pay | Admitting: Neurology

## 2021-05-23 NOTE — Telephone Encounter (Signed)
Referral for home health sent to [facility]. They will review and let me know if they can accept.

## 2021-05-23 NOTE — Telephone Encounter (Signed)
Spoke with [place] at [facility]. They can accept referral and will call patient to start care.

## 2021-05-27 ENCOUNTER — Telehealth: Payer: Self-pay | Admitting: Physician Assistant

## 2021-05-27 ENCOUNTER — Ambulatory Visit
Admission: EM | Admit: 2021-05-27 | Discharge: 2021-05-27 | Disposition: A | Discharge status: Home / Self Care | Payer: Medicare Other | Attending: Internal Medicine | Admitting: Internal Medicine

## 2021-05-27 DIAGNOSIS — R350 Frequency of micturition: Secondary | ICD-10-CM | POA: Diagnosis not present

## 2021-05-27 DIAGNOSIS — R300 Dysuria: Secondary | ICD-10-CM | POA: Insufficient documentation

## 2021-05-27 DIAGNOSIS — N3001 Acute cystitis with hematuria: Secondary | ICD-10-CM | POA: Diagnosis not present

## 2021-05-27 LAB — POCT URINALYSIS DIP (MANUAL ENTRY)
Bilirubin, UA: NEGATIVE
Glucose, UA: NEGATIVE mg/dL
Ketones, POC UA: NEGATIVE mg/dL
Nitrite, UA: POSITIVE — AB
Protein Ur, POC: NEGATIVE mg/dL
Spec Grav, UA: 1.02 (ref 1.010–1.025)
Urobilinogen, UA: 0.2 E.U./dL
pH, UA: 6 (ref 5.0–8.0)

## 2021-05-27 MED ORDER — CEPHALEXIN 500 MG PO CAPS: 500.0000 mg | ORAL_CAPSULE | Freq: Four times a day (QID) | ORAL | 0 refills | Status: DC

## 2021-05-27 NOTE — ED Provider Notes (Addendum)
[facility]    CSN: [id number] Arrival date & time: 05/27/21  1434      History   Chief Complaint Chief Complaint  Patient presents with   Dysuria    HPI [name] is a 75 y.o. female.   Patient presents with urinary burning and urinary frequency that started a few days prior.  Denies any vaginal discharge, hematuria, pelvic pain, normal pain, fever, back pain.  States that she has frequent bladder infections.  Last known urinary tract infection was in January per patient and per patient chart.   Dysuria  Past Medical History:  Diagnosis Date   Anemia    took Fe- orally 2 yrs. ago    Anxiety    panic attacks    Arthritis    shoulders, knees   Cancer ([facility])    skin- preCA   Depression    GERD (gastroesophageal reflux disease)    uses Protonix as needed    Headache(784.0)    Humerus fracture    Right    Hyposmolality and/or hyponatremia 02/27/2013   Hypothyroidism    IBS (irritable bowel syndrome)    Memory loss    Mental disorder    Migraine    Preoperative examination 02/26/2013   Seizures ([place])    caused by depression , seritonin seizure - effected short term memory     Shortness of breath     Patient Active Problem List   Diagnosis Date Noted   Chronic migraine w/o aura w/o status migrainosus, not intractable 05/19/2021   Dementia with behavioral disturbance ([place]) 30/86/5784   Folliculitis 69/62/9528   Rash 12/27/2020   Abnormal weight loss 10/22/2020   Morbid obesity ([place]) 10/22/2020   Nausea 10/22/2020   Long-term current use of opiate analgesic 12/01/2019   Encounter for Medicare annual wellness exam 12/25/2018   Chronic back pain 09/05/2018   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   History of gastrointestinal hemorrhage 09/05/2018   Insomnia 09/05/2018   Injury of left foot 09/05/2018   Chronic low back pain 05/07/2018   Cognitive impairment 05/07/2018   Age related osteoporosis 03/12/2018   On long term drug therapy 09/07/2017    Left-sided chest wall pain 07/25/2017   Hypotension 01/22/2017   Cerebrovascular accident (CVA) due to thrombosis of precerebral artery ([place]) 12/05/2016   Neck pain 12/05/2016   Gait abnormality 12/05/2016   Paronychia of finger of right hand 08/22/2016   Osteoarthritis of right knee 11/22/2015   Right knee pain 10/14/2015   Actinic keratoses 07/02/2015   Injury of right hand 04/27/2014   Chest wall tenderness 04/27/2014   Foot drop, left 04/14/2014   Traumatic injury of lower extremity 03/25/2014   Obesity (BMI 30.0-34.9) 01/30/2014   Healthcare maintenance 01/30/2014   Status post right shoulder hemiarthroplasty 41/32/4401   Diastolic CHF ([place]) 02/72/5366   Fibromyalgia 08/14/2012   Numbness in feet 08/08/2012   GERD with stricture 03/04/2012   Leg edema 03/04/2012   CATARACT, LEFT EYE 08/23/2010   Abnormality of gait 08/23/2010   Osteoarthritis 02/24/2010   Iron deficiency anemia 03/06/2009   DEPENDENCE, OPIOID, 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    HERNIA REPAIR     ORIF ANKLE FRACTURE Right 2004   REPLACEMENT TOTAL KNEE     REVERSE SHOULDER ARTHROPLASTY Right 05/16/2013   Procedure: RIGHT HEMI ARTHROPLASTY  VERSES  REVERSE TOTAL SHOULDER ARTHROPLASTY ;  Surgeon: Augustin Schooling, MD;  Location: [facility];  Service: Orthopedics;  Laterality: Right;   SPINE SURGERY  2005   Lumbar spinal stenosis    STOMACH  SURGERY     1/3 resection for obstruction   TONSILLECTOMY     TUBAL LIGATION      OB History   No obstetric history on file.      Home Medications    Prior to Admission medications   Medication Sig Start Date End Date Taking? Authorizing Provider  cephALEXin (KEFLEX) 500 MG capsule Take 1 capsule (500 mg total) by mouth 4 (four) times daily. 05/27/21  Yes Odis Luster, FNP  Erenumab-aooe (AIMOVIG) 70 MG/ML SOAJ Inject 70 mg into the skin every 30 (thirty) days. 05/19/21   Marcial Pacas, MD  aspirin 325 MG tablet Take 325 mg by mouth daily.    [provider]  calamine lotion Apply 1 application topically as needed for itching. 03/04/21   Lorrene Reid, PA-C  levothyroxine (SYNTHROID) 100 MCG tablet TAKE 1 TABLET BY MOUTH DAILY 04/20/21   Lorrene Reid, PA-C  mupirocin cream (BACTROBAN) 2 % Apply 1 application topically 2 (two) times daily. 03/04/21   Lorrene Reid, PA-C  ondansetron (ZOFRAN ODT) 4 MG disintegrating tablet Take 1 tablet (4 mg total) by mouth every 8 (eight) hours as needed for nausea or vomiting. 05/19/21   Marcial Pacas, MD  oxyCODONE (ROXICODONE) 15 MG immediate release tablet Take 1 tablet by mouth. May take up to 5 times daily 10/10/18   [provider]  pregabalin (LYRICA) 150 MG capsule TAKE 1 CAPSULE BY MOUTH Two TIMES DAILY, new dosing 01/24/21   Suzzanne Cloud, NP  ramelteon (ROZEREM) 8 MG tablet Take 1 tablet by mouth daily. 11/26/18   [provider]  tiZANidine (ZANAFLEX) 4 MG tablet Take 1 tablet (4 mg total) by mouth every 6 (six) hours as needed for muscle spasms. 05/19/21   Marcial Pacas, MD  traZODone (DESYREL) 50 MG tablet Take 50-150 mg at bedtime as needed by mouth for sleep (and may take one additional 50 mg tablet daily AS NEEDED for part of a "headache cocktail").     [provider]  triamcinolone (KENALOG) 0.025 % cream Apply 1 application topically 2 (two) times daily. 12/27/20   Ronnell Freshwater, NP  Ubrogepant (UBRELVY)  50 MG TABS Take 50 mg by mouth as needed (may repeat dose in 2 hours, not to exceed 100 mg/day, do not take more than 8 days a month). 05/19/21   Marcial Pacas, MD  venlafaxine (EFFEXOR) 37.5 MG tablet Take 1 tablet (37.5 mg total) by mouth 2 (two) times daily. 11/08/20   Suzzanne Cloud, NP  vortioxetine HBr (TRINTELLIX) 20 MG TABS tablet Take 20 mg by mouth daily. 07/13/16   [provider]    Family History Family History  Problem Relation Age of Onset   Aneurysm Mother    Heart disease Mother    Cancer Father        pancreatic  Stroke Father 65   Hypertension Father    Alcohol abuse Father    Heart disease Maternal Grandfather     Social History Social History   Tobacco Use   Smoking status: Never   Smokeless tobacco: Never  Vaping Use   Vaping Use: Never used  Substance Use Topics   Alcohol use: Yes    Comment: rare use of white wine    Drug use: No     Allergies   Bupropion hcl, Dairy aid [lactase], Other, Penicillins, Azithromycin, and Erythromycin   Review of Systems Review of Systems Per HPI  Physical Exam Triage Vital Signs ED Triage Vitals [05/27/21 1544]  Enc Vitals Group     BP 121/75     Pulse Rate 64     Resp 18     Temp 98 F (36.7 C)     Temp Source Oral     SpO2 92 %     Weight      Height      Head Circumference      Peak Flow      Pain Score 0     Pain Loc      Pain Edu?      Excl. in [place]?    No data found.  Updated Vital Signs BP 121/75 (BP Location: Left Arm)   Pulse 64   Temp 98 F (36.7 C) (Oral)   Resp 18   SpO2 92%   Visual Acuity Right Eye Distance:   Left Eye Distance:   Bilateral Distance:    Right Eye Near:   Left Eye Near:    Bilateral Near:     Physical Exam Constitutional:      Appearance: Normal appearance.  HENT:     Head: Normocephalic and atraumatic.  Eyes:     Extraocular Movements: Extraocular movements intact.     Conjunctiva/sclera: Conjunctivae normal.  Cardiovascular:     Rate and  Rhythm: Normal rate and regular rhythm.     Pulses: Normal pulses.     Heart sounds: Normal heart sounds.  Pulmonary:     Effort: Pulmonary effort is normal.     Breath sounds: Normal breath sounds.  Abdominal:     General: Abdomen is flat. Bowel sounds are normal. There is no distension.     Palpations: Abdomen is soft.     Tenderness: There is no abdominal tenderness.  Skin:    General: Skin is warm and dry.  Neurological:     General: No focal deficit present.     Mental Status: She is alert and oriented to person, place, and time. Mental status is at baseline.  Psychiatric:        Mood and Affect: Mood normal.        Behavior: Behavior normal.        Thought Content: Thought content normal.        Judgment: Judgment normal.     UC Treatments / Results  Labs (all labs ordered are listed, but only abnormal results are displayed) Labs Reviewed  POCT URINALYSIS DIP (MANUAL ENTRY) - Abnormal; Notable for the following components:      Result Value   Clarity, UA cloudy (*)    Blood, UA small (*)    Nitrite, UA Positive (*)    Leukocytes, UA Large (3+) (*)    All other components within normal limits  URINE CULTURE    EKG   Radiology No results found.  Procedures Procedures (including critical care time)  Medications Ordered in UC Medications - No data to display  Initial Impression / Assessment and Plan / UC Course  I have reviewed the triage vital signs and the nursing notes.  Pertinent labs & imaging results that were available during my care of the patient were reviewed by me and considered in my medical decision making (see chart for details).     Urinalysis positive for leukocytes and nitrites.  Will treat with cephalexin antibiotic x7 days due to successful treatment in the past with this antibiotic.  Urine culture is pending.  Patient to increase water intake.oxygen 92% on triage.  This seems consistent with past vital signs.  No respiratory distress or  shortness of breath noted.  Discussed strict return precautions.  No red flags seen on physical exam.  Patient verbalized understanding and is agreeable with plan.  Final Clinical Impressions(s) / UC Diagnoses   Final diagnoses:  Acute cystitis with hematuria  Dysuria  Urinary frequency     Discharge Instructions      Your urine did show signs of urinary tract infection.  Will treat with cephalexin antibiotic.  Urine culture is pending.  We will call if there are changes to treatment.      ED Prescriptions     Medication Sig Dispense Auth. Provider   cephALEXin (KEFLEX) 500 MG capsule Take 1 capsule (500 mg total) by mouth 4 (four) times daily. 28 capsule Odis Luster, FNP      PDMP not reviewed this encounter.   Odis Luster, FNP 05/27/21 [place], [place], [place] 05/27/21 [phone]

## 2021-05-27 NOTE — Discharge Instructions (Addendum)
Your urine did show signs of urinary tract infection.  Will treat with cephalexin antibiotic.  Urine culture is pending.  We will call if there are changes to treatment.

## 2021-05-27 NOTE — ED Triage Notes (Signed)
Pt c/o dysuria, onset "a couple of days ago." States she gets "bladder infections" regularly. C/o polyuria. Denies hematuria, discharge, odor, lower back pain, pelvic pain.

## 2021-05-27 NOTE — Telephone Encounter (Signed)
Patient had friend call and state that patient is having vaginal pain. Denies any bleeding. Advised them to go to [facility] or ED for evaluation as we close at [facility]

## 2021-05-30 LAB — URINE CULTURE: Culture: >=100000 — AB

## 2021-06-04 DIAGNOSIS — M50221 Other cervical disc displacement at C4-C5 level: Secondary | ICD-10-CM | POA: Diagnosis not present

## 2021-06-04 DIAGNOSIS — K219 Gastro-esophageal reflux disease without esophagitis: Secondary | ICD-10-CM | POA: Diagnosis not present

## 2021-06-04 DIAGNOSIS — K589 Irritable bowel syndrome without diarrhea: Secondary | ICD-10-CM | POA: Diagnosis not present

## 2021-06-04 DIAGNOSIS — Z9181 History of falling: Secondary | ICD-10-CM | POA: Diagnosis not present

## 2021-06-04 DIAGNOSIS — S069X0S Unspecified intracranial injury without loss of consciousness, sequela: Secondary | ICD-10-CM | POA: Diagnosis not present

## 2021-06-04 DIAGNOSIS — Z79891 Long term (current) use of opiate analgesic: Secondary | ICD-10-CM | POA: Diagnosis not present

## 2021-06-04 DIAGNOSIS — F0283 Dementia in other diseases classified elsewhere, unspecified severity, with mood disturbance: Secondary | ICD-10-CM | POA: Diagnosis not present

## 2021-06-04 DIAGNOSIS — F39 Unspecified mood [affective] disorder: Secondary | ICD-10-CM | POA: Diagnosis not present

## 2021-06-04 DIAGNOSIS — F410 Panic disorder [episodic paroxysmal anxiety] without agoraphobia: Secondary | ICD-10-CM | POA: Diagnosis not present

## 2021-06-04 DIAGNOSIS — M50321 Other cervical disc degeneration at C4-C5 level: Secondary | ICD-10-CM | POA: Diagnosis not present

## 2021-06-04 DIAGNOSIS — Z8781 Personal history of (healed) traumatic fracture: Secondary | ICD-10-CM | POA: Diagnosis not present

## 2021-06-04 DIAGNOSIS — M150 Primary generalized (osteo)arthritis: Secondary | ICD-10-CM | POA: Diagnosis not present

## 2021-06-04 DIAGNOSIS — G43709 Chronic migraine without aura, not intractable, without status migrainosus: Secondary | ICD-10-CM | POA: Diagnosis not present

## 2021-06-04 DIAGNOSIS — Z7982 Long term (current) use of aspirin: Secondary | ICD-10-CM | POA: Diagnosis not present

## 2021-06-04 DIAGNOSIS — F32A Depression, unspecified: Secondary | ICD-10-CM | POA: Diagnosis not present

## 2021-06-04 DIAGNOSIS — E039 Hypothyroidism, unspecified: Secondary | ICD-10-CM | POA: Diagnosis not present

## 2021-06-07 ENCOUNTER — Telehealth: Payer: Self-pay | Admitting: Physician Assistant

## 2021-06-07 NOTE — Telephone Encounter (Signed)
[place], Physical Therapist w/[facility] called to get verbal orders for physical therapy. Duration is listed below.  Please call [facility] back with the approval to move forward at [phone].   Duration of physical therapy: 1 time a week for 1 week 2 times a week for 5 weeks  1 time a week for 5 weeks

## 2021-06-07 NOTE — Telephone Encounter (Signed)
Called [name] with [facility] gave approval to moved forward with orders for physical therapy

## 2021-06-09 ENCOUNTER — Telehealth: Payer: Self-pay | Admitting: Physician Assistant

## 2021-06-09 DIAGNOSIS — M150 Primary generalized (osteo)arthritis: Secondary | ICD-10-CM | POA: Diagnosis not present

## 2021-06-09 DIAGNOSIS — E039 Hypothyroidism, unspecified: Secondary | ICD-10-CM | POA: Diagnosis not present

## 2021-06-09 DIAGNOSIS — F0283 Dementia in other diseases classified elsewhere, unspecified severity, with mood disturbance: Secondary | ICD-10-CM | POA: Diagnosis not present

## 2021-06-09 DIAGNOSIS — F39 Unspecified mood [affective] disorder: Secondary | ICD-10-CM | POA: Diagnosis not present

## 2021-06-09 DIAGNOSIS — S069X0S Unspecified intracranial injury without loss of consciousness, sequela: Secondary | ICD-10-CM | POA: Diagnosis not present

## 2021-06-09 DIAGNOSIS — G43709 Chronic migraine without aura, not intractable, without status migrainosus: Secondary | ICD-10-CM | POA: Diagnosis not present

## 2021-06-09 NOTE — Telephone Encounter (Signed)
Patients son [name] (on [place]) calling to see if paperwork emailed over on Monday has been completed.   This paperwork was given to [facility]. Please call [name] back at [phone] when completed. AS, CMA

## 2021-06-10 ENCOUNTER — Ambulatory Visit: Payer: Medicare Other

## 2021-06-10 DIAGNOSIS — S069X0S Unspecified intracranial injury without loss of consciousness, sequela: Secondary | ICD-10-CM | POA: Diagnosis not present

## 2021-06-10 DIAGNOSIS — M150 Primary generalized (osteo)arthritis: Secondary | ICD-10-CM | POA: Diagnosis not present

## 2021-06-10 DIAGNOSIS — F39 Unspecified mood [affective] disorder: Secondary | ICD-10-CM | POA: Diagnosis not present

## 2021-06-10 DIAGNOSIS — F0283 Dementia in other diseases classified elsewhere, unspecified severity, with mood disturbance: Secondary | ICD-10-CM | POA: Diagnosis not present

## 2021-06-10 DIAGNOSIS — G43709 Chronic migraine without aura, not intractable, without status migrainosus: Secondary | ICD-10-CM | POA: Diagnosis not present

## 2021-06-10 DIAGNOSIS — E039 Hypothyroidism, unspecified: Secondary | ICD-10-CM | POA: Diagnosis not present

## 2021-06-10 NOTE — Telephone Encounter (Signed)
Called [name] and notified paperwork is ready for pick up.

## 2021-06-22 DIAGNOSIS — M150 Primary generalized (osteo)arthritis: Secondary | ICD-10-CM | POA: Diagnosis not present

## 2021-06-22 DIAGNOSIS — S069X0S Unspecified intracranial injury without loss of consciousness, sequela: Secondary | ICD-10-CM | POA: Diagnosis not present

## 2021-06-22 DIAGNOSIS — G43709 Chronic migraine without aura, not intractable, without status migrainosus: Secondary | ICD-10-CM | POA: Diagnosis not present

## 2021-06-22 DIAGNOSIS — E039 Hypothyroidism, unspecified: Secondary | ICD-10-CM | POA: Diagnosis not present

## 2021-06-22 DIAGNOSIS — F0283 Dementia in other diseases classified elsewhere, unspecified severity, with mood disturbance: Secondary | ICD-10-CM | POA: Diagnosis not present

## 2021-06-22 DIAGNOSIS — F39 Unspecified mood [affective] disorder: Secondary | ICD-10-CM | POA: Diagnosis not present

## 2021-06-27 ENCOUNTER — Other Ambulatory Visit: Payer: Self-pay | Admitting: Neurology

## 2021-06-27 DIAGNOSIS — G894 Chronic pain syndrome: Secondary | ICD-10-CM | POA: Diagnosis not present

## 2021-07-08 ENCOUNTER — Ambulatory Visit: Payer: Medicare Other | Admitting: Physician Assistant

## 2021-07-08 ENCOUNTER — Ambulatory Visit: Payer: Medicare Other

## 2021-07-18 ENCOUNTER — Other Ambulatory Visit: Payer: Self-pay | Admitting: Physician Assistant

## 2021-07-22 ENCOUNTER — Ambulatory Visit (INDEPENDENT_AMBULATORY_CARE_PROVIDER_SITE_OTHER): Payer: Medicare Other | Admitting: Physician Assistant

## 2021-07-22 ENCOUNTER — Other Ambulatory Visit: Payer: Self-pay

## 2021-07-22 ENCOUNTER — Encounter: Payer: Self-pay | Admitting: Physician Assistant

## 2021-07-22 VITALS — BP 138/63 | HR 80 | Temp 98.0°F

## 2021-07-22 DIAGNOSIS — R7303 Prediabetes: Secondary | ICD-10-CM | POA: Diagnosis not present

## 2021-07-22 DIAGNOSIS — F339 Major depressive disorder, recurrent, unspecified: Secondary | ICD-10-CM | POA: Diagnosis not present

## 2021-07-22 DIAGNOSIS — Z23 Encounter for immunization: Secondary | ICD-10-CM

## 2021-07-22 DIAGNOSIS — G43709 Chronic migraine without aura, not intractable, without status migrainosus: Secondary | ICD-10-CM | POA: Diagnosis not present

## 2021-07-22 DIAGNOSIS — D509 Iron deficiency anemia, unspecified: Secondary | ICD-10-CM

## 2021-07-22 DIAGNOSIS — E039 Hypothyroidism, unspecified: Secondary | ICD-10-CM | POA: Diagnosis not present

## 2021-07-22 NOTE — Assessment & Plan Note (Signed)
-  Followed by Neurology. -Reviewed consult 05/19/2021. Started on Ajovy for preventative therapy, on Ubrelvy for abortive therapy.

## 2021-07-22 NOTE — Patient Instructions (Signed)

## 2021-07-22 NOTE — Progress Notes (Signed)
Established Patient Office Visit  Subjective:  Patient ID: [name], female    DOB: Jun 03, 1946  Age: 75 y.o. MRN: [id number]  CC:  Chief Complaint  Patient presents with   Follow-up    HPI [name] presents for follow up on mood, hypothyroidism and prediabetes. [facility] which she has enjoyed so far.   Mood: Followed by psychiatry. Patient states mood has been stable. Some days feels more down than other but rarely. Taking medications as directed without problems.   Hypothyroidism: Denies fatigue, hair loss, palpitations or unintentional weight changes. Taking medication as directed without issues.  Prediabetes: Denies increased thirst or urination from baseline. Reports has an aide that cooks for her.   Migraine: Patient reports was started on preventative therapy for migraine. States headache has mildly improved but has only taken 2 doses of Ajovy.   Past Medical History:  Diagnosis Date   Anemia    took Fe- orally 2 yrs. 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    HERNIA REPAIR     ORIF ANKLE FRACTURE Right 2004   REPLACEMENT TOTAL KNEE     REVERSE SHOULDER ARTHROPLASTY Right 05/16/2013   Procedure:  RIGHT HEMI ARTHROPLASTY  VERSES  REVERSE TOTAL SHOULDER ARTHROPLASTY ;  Surgeon: Augustin Schooling, MD;  Location: [facility];  Service: Orthopedics;  Laterality: Right;   SPINE SURGERY  2005   Lumbar spinal stenosis    STOMACH SURGERY     1/3 resection for obstruction   TONSILLECTOMY     TUBAL LIGATION      Family History  Problem Relation Age of Onset   Aneurysm Mother    Heart disease Mother    Cancer Father        pancreatic   Stroke Father 50   Hypertension Father    Alcohol abuse Father    Heart disease Maternal Grandfather     Social History   Socioeconomic History   Marital status: Married    Spouse name: Not on file   Number of children: 1   Years of education: College   Highest education level: Not on file  Occupational History   Occupation: Retired  Tobacco Use   Smoking status: Never   Smokeless tobacco: Never  Vaping Use   Vaping Use: Never used  Substance and Sexual Activity   Alcohol use: Yes    Comment: rare use of white wine    Drug use: No   Sexual activity: Not on  file  Other Topics Concern   Not on file  Social History Narrative   Lives at home with her husband.   2 cups caffeine daily.   Right-handed.   Social Determinants of Health   Financial Resource Strain: Not on file  Food Insecurity: Not on file  Transportation Needs: Not on file  Physical Activity: Not on file  Stress: Not on file  Social Connections: Not on file  Intimate Partner Violence: Not on file    Outpatient Medications Prior to Visit  Medication Sig Dispense Refill   aspirin 325 MG tablet Take 325 mg by mouth daily.     calamine lotion Apply 1 application topically as needed for itching. 120 mL 0   cephALEXin (KEFLEX) 500 MG capsule Take 1 capsule (500 mg total) by mouth 4 (four) times daily. 28 capsule 0   Erenumab-aooe (AIMOVIG) 70 MG/ML SOAJ Inject 70 mg into the skin every 30 (thirty) days. 1 mL 11   levothyroxine (SYNTHROID) 100 MCG tablet TAKE 1 TABLET BY MOUTH  DAILY 90 tablet 1   mupirocin cream (BACTROBAN) 2 % Apply 1 application topically 2 (two) times daily. 30 g 2   ondansetron (ZOFRAN ODT) 4 MG disintegrating tablet Take 1 tablet (4 mg total) by mouth every 8 (eight) hours as needed for nausea or vomiting. 20 tablet 6   oxyCODONE (ROXICODONE) 15 MG immediate release tablet Take 1 tablet by mouth. May take up to 5 times daily     pregabalin (LYRICA) 150 MG capsule TAKE 1 CAPSULE BY MOUTH Two TIMES DAILY, new dosing 180 capsule 1   ramelteon (ROZEREM) 8 MG tablet Take 1 tablet by mouth daily.     tiZANidine (ZANAFLEX) 4 MG tablet Take 1 tablet (4 mg total) by mouth every 6 (six) hours as needed for muscle spasms. 30 tablet 6   traZODone (DESYREL) 50 MG tablet Take 50-150 mg at bedtime as needed by mouth for sleep (and may take one additional 50 mg tablet daily AS NEEDED for part of a "headache cocktail").      triamcinolone (KENALOG) 0.025 % cream Apply 1 application topically 2 (two) times daily. 80 g 2   Ubrogepant (UBRELVY) 50 MG TABS Take 50 mg by mouth as needed (may repeat dose in 2 hours, not to exceed 100 mg/day, do not take more than 8 days a month). 12 tablet 11   venlafaxine (EFFEXOR) 37.5 MG tablet TAKE 1 TABLET BY MOUTH TWICE DAILY 180 tablet 1   vortioxetine HBr (TRINTELLIX) 20 MG TABS tablet Take 20 mg by mouth daily.     No facility-administered medications prior to visit.    Allergies  Allergen Reactions   Bupropion Hcl Other (See Comments)    SEIZURES!!   Dairy Aid [Tilactase] Diarrhea   Other    Penicillins    Azithromycin Rash   Erythromycin Rash    ROS Review of Systems Review of Systems:  A fourteen system review of systems was performed and found to be positive as per HPI.   Objective:    Physical Exam General:  Pleasant and cooperative, in no acute distress, using walking for ambulation  Neuro:  Alert and oriented,  extra-ocular muscles intact  HEENT:  Normocephalic, atraumatic, neck supple Skin:  no gross  rash, warm, pink. Cardiac:  RRR Respiratory: CTA B/L, Not using accessory muscles, speaking in full sentences- unlabored. Vascular:  Ext warm, no cyanosis apprec.; Psych:  No HI/SI, judgement and insight good, Euthymic mood. Full Affect.  BP 138/63  Pulse 80   Temp 98 F (36.7 C)  Wt Readings from Last 3 Encounters:  05/19/21 219 lb (99.3 kg)  03/15/21 219 lb (99.3 kg)  03/04/21 219 lb 0.6 oz (99.4 kg)     Health Maintenance Due  Topic Date Due   Zoster Vaccines- Shingrix (1 of 2) Never done   TETANUS/TDAP  02/02/2013   COVID-19 Vaccine (2 - Pfizer risk series) 01/18/2021    There are no preventive care reminders to display for this patient.  Lab Results  Component Value Date   TSH 5.720 (H) 03/04/2021   Lab Results  Component Value Date   WBC 9.9 03/15/2021   HGB 10.9 (L) 03/15/2021   HCT 37.5 03/15/2021   MCV 78.5 (L) 03/15/2021   PLT 198 03/15/2021   Lab Results  Component Value Date   NA 132 (L) 03/15/2021   K 4.7 03/15/2021   CO2 22 03/15/2021   GLUCOSE 102 (H) 03/15/2021   BUN 14 03/15/2021   CREATININE 0.69 03/15/2021   BILITOT 0.3 03/04/2021   ALKPHOS 86 03/04/2021   AST 14 03/04/2021   ALT 6 03/04/2021   PROT 6.8 03/04/2021   ALBUMIN 4.2 03/04/2021   CALCIUM 8.6 (L) 03/15/2021   ANIONGAP 10 03/15/2021   EGFR 60 03/04/2021   Lab Results  Component Value Date   CHOL 179 02/04/2019   Lab Results  Component Value Date   HDL 83 02/04/2019   Lab Results  Component Value Date   LDLCALC 78 02/04/2019   Lab Results  Component Value Date   TRIG 89 02/04/2019   Lab Results  Component Value Date   CHOLHDL 2.2 02/04/2019   Lab Results  Component Value Date   HGBA1C 5.7 (H) 03/04/2021   Depression screen PHQ 2/9 07/22/2021 12/27/2020 11/15/2020 10/22/2020 07/02/2020  Decreased Interest 0 2 1 0 0  Down, Depressed, Hopeless 0 1 1 0 0  PHQ - 2 Score 0 3 2 0 0  Altered sleeping 0 3 3 1  0  Tired, decreased energy 0 3 3 0 1  Change in appetite  0 2 3 1 1   Feeling bad or failure about yourself  0 1 3 1 1   Trouble concentrating 0 1 3 3 1   Moving slowly or fidgety/restless 0 0 0 0 0  Suicidal thoughts 0 0 0 0 0  PHQ-9 Score 0 13 17 6 4   Difficult doing work/chores - - - - -  Some recent data might be hidden   GAD 7 : Generalized Anxiety Score 07/22/2021  Nervous, Anxious, on Edge 0  Control/stop worrying 0  Worry too much - different things 0  Trouble relaxing 0  Restless 0  Easily annoyed or irritable 0  Afraid - awful might happen 0  Total GAD 7 Score 0        Assessment & Plan:   Problem List Items Addressed This Visit       Cardiovascular and Mediastinum   Chronic migraine w/o aura w/o status migrainosus, not intractable    -Followed by Neurology. -Reviewed consult 05/19/2021. Started on Ajovy for preventative therapy, on Ubrelvy for abortive therapy.        Endocrine   Hypothyroidism - Primary    -Last TSH wnl, asymptomatic. -Continue current medication regimen. -Rechecking TSH today. Pending results will make medication adjustments if indicated.       Relevant Orders   TSH     Other   Iron deficiency anemia   Relevant Orders  CBC w/Diff   Other Visit Diagnoses     Prediabetes       Relevant Orders   Comp Met (CMET)   HgB A1c   Depression, recurrent ([place])       Need for influenza vaccination       Relevant Orders   Flu Vaccine QUAD High Dose(Fluad) (Completed)      Recurrent depression: -Stable. -Continue current medication regimen. -Followed by Psychiatry.   Prediabetes: -Last A1c 5.7, will repeat A1c with labs. -Recommend to follow low carbohydrate/glucose diet. -Will continue to monitor.   No orders of the defined types were placed in this encounter.   Follow-up: Return in about 4 months (around 11/19/2021) for Thyroid, BP,mood.    Lorrene Reid, PA-C

## 2021-07-22 NOTE — Assessment & Plan Note (Signed)
-  Last TSH wnl, asymptomatic. -Continue current medication regimen. -Rechecking TSH today. Pending results will make medication adjustments if indicated.

## 2021-07-23 LAB — CBC WITH DIFFERENTIAL/PLATELET
Basophils Absolute: 0.1 10*3/uL (ref 0.0–0.2)
Basos: 1 %
EOS (ABSOLUTE): 0.3 10*3/uL (ref 0.0–0.4)
Eos: 5 %
Hematocrit: 36.7 % (ref 34.0–46.6)
Hemoglobin: 11.3 g/dL (ref 11.1–15.9)
Immature Grans (Abs): 0 10*3/uL (ref 0.0–0.1)
Immature Granulocytes: 1 %
Lymphocytes Absolute: 1.5 10*3/uL (ref 0.7–3.1)
Lymphs: 23 %
MCH: 23.6 pg — ABNORMAL LOW (ref 26.6–33.0)
MCHC: 30.8 g/dL — ABNORMAL LOW (ref 31.5–35.7)
MCV: 77 fL — ABNORMAL LOW (ref 79–97)
Monocytes Absolute: 0.5 10*3/uL (ref 0.1–0.9)
Monocytes: 9 %
Neutrophils Absolute: 3.9 10*3/uL (ref 1.4–7.0)
Neutrophils: 61 %
Platelets: 218 10*3/uL (ref 150–450)
RBC: 4.79 x10E6/uL (ref 3.77–5.28)
RDW: 16.2 % — ABNORMAL HIGH (ref 11.7–15.4)
WBC: 6.2 10*3/uL (ref 3.4–10.8)

## 2021-07-23 LAB — COMPREHENSIVE METABOLIC PANEL
ALT: 10 IU/L (ref 0–32)
AST: 18 IU/L (ref 0–40)
Albumin/Globulin Ratio: 1.4 (ref 1.2–2.2)
Albumin: 3.9 g/dL (ref 3.7–4.7)
Alkaline Phosphatase: 85 IU/L (ref 44–121)
BUN/Creatinine Ratio: 18 (ref 12–28)
BUN: 16 mg/dL (ref 8–27)
Bilirubin Total: 0.4 mg/dL (ref 0.0–1.2)
CO2: 23 mmol/L (ref 20–29)
Calcium: 8.7 mg/dL (ref 8.7–10.3)
Chloride: 99 mmol/L (ref 96–106)
Creatinine, Ser: 0.9 mg/dL (ref 0.57–1.00)
Globulin, Total: 2.8 g/dL (ref 1.5–4.5)
Glucose: 83 mg/dL (ref 70–99)
Potassium: 4.6 mmol/L (ref 3.5–5.2)
Sodium: 137 mmol/L (ref 134–144)
Total Protein: 6.7 g/dL (ref 6.0–8.5)
eGFR: 66 mL/min/{1.73_m2} (ref >59)

## 2021-07-23 LAB — TSH: TSH: 3.12 u[IU]/mL (ref 0.450–4.500)

## 2021-07-23 LAB — HEMOGLOBIN A1C
Est. average glucose Bld gHb Est-mCnc: 120 mg/dL
Hgb A1c MFr Bld: 5.8 % — ABNORMAL HIGH (ref 4.8–5.6)

## 2021-08-01 ENCOUNTER — Other Ambulatory Visit: Payer: Self-pay | Admitting: Neurology

## 2021-08-25 ENCOUNTER — Telehealth: Payer: Self-pay | Admitting: Neurology

## 2021-08-25 NOTE — Telephone Encounter (Signed)
Pt is requesting a refill for Ubrogepant (UBRELVY) 50 MG TABS .  Pharmacy: [facility]

## 2021-08-25 NOTE — Telephone Encounter (Signed)
There are refills on file at the pharmacy. I spoke to her son. He says she is fine on the medication.

## 2021-09-26 DIAGNOSIS — Z79891 Long term (current) use of opiate analgesic: Secondary | ICD-10-CM | POA: Diagnosis not present

## 2021-09-26 DIAGNOSIS — M545 Low back pain, unspecified: Secondary | ICD-10-CM | POA: Diagnosis not present

## 2021-09-26 DIAGNOSIS — G894 Chronic pain syndrome: Secondary | ICD-10-CM | POA: Diagnosis not present

## 2021-10-04 ENCOUNTER — Encounter: Payer: Self-pay | Admitting: Physician Assistant

## 2021-10-04 ENCOUNTER — Ambulatory Visit (INDEPENDENT_AMBULATORY_CARE_PROVIDER_SITE_OTHER): Payer: Medicare Other | Admitting: Physician Assistant

## 2021-10-04 ENCOUNTER — Other Ambulatory Visit: Payer: Self-pay

## 2021-10-04 VITALS — BP 127/72 | HR 82 | Temp 98.3°F | Ht 64.0 in | Wt 219.0 lb

## 2021-10-04 DIAGNOSIS — R600 Localized edema: Secondary | ICD-10-CM | POA: Diagnosis not present

## 2021-10-04 MED ORDER — FUROSEMIDE 20 MG PO TABS: 20.0000 mg | ORAL_TABLET | Freq: Every day | ORAL | 0 refills | Status: DC | PRN

## 2021-10-04 NOTE — Patient Instructions (Signed)
Peripheral Edema °Peripheral edema is swelling that is caused by a buildup of fluid. Peripheral edema most often affects the lower legs, ankles, and feet. It can also develop in the arms, hands, and face. The area of the body that has peripheral edema will look swollen. It may also feel heavy or warm. Your clothes may start to feel tight. Pressing on the area may make a temporary dent in your skin. You may not be able to move your swollen arm or leg as much as usual. °There are many causes of peripheral edema. It can happen because of a complication of other conditions such as congestive heart failure, kidney disease, or a problem with your blood circulation. It also can be a side effect of certain medicines or because of an infection. It often happens to women during pregnancy. Sometimes, the cause is not known. °Follow these instructions at home: °Managing pain, stiffness, and swelling ° °Raise (elevate) your legs while you are sitting or lying down. °Move around often to prevent stiffness and to lessen swelling. °Do not sit or stand for long periods of time. °Wear support stockings as told by your health care provider. °Medicines °Take over-the-counter and prescription medicines only as told by your health care provider. °Your health care provider may prescribe medicine to help your body get rid of excess water (diuretic). °General instructions °Pay attention to any changes in your symptoms. °Follow instructions from your health care provider about limiting salt (sodium) in your diet. Sometimes, eating less salt may reduce swelling. °Moisturize skin daily to help prevent skin from cracking and draining. °Keep all follow-up visits as told by your health care provider. This is important. °Contact a health care provider if you have: °A fever. °Edema that starts suddenly or is getting worse, especially if you are pregnant or have a medical condition. °Swelling in only one leg. °Increased swelling, redness, or pain in  one or both of your legs. °Drainage or sores at the area where you have edema. °Get help right away if you: °Develop shortness of breath, especially when you are lying down. °Have pain in your chest or abdomen. °Feel weak. °Feel faint. °Summary °Peripheral edema is swelling that is caused by a buildup of fluid. Peripheral edema most often affects the lower legs, ankles, and feet. °Move around often to prevent stiffness and to lessen swelling. Do not sit or stand for long periods of time. °Pay attention to any changes in your symptoms. °Contact a health care provider if you have edema that starts suddenly or is getting worse, especially if you are pregnant or have a medical condition. °Get help right away if you develop shortness of breath, especially when lying down. °This information is not intended to replace advice given to you by your health care provider. Make sure you discuss any questions you have with your health care provider. °Document Revised: 01/20/2021 Document Reviewed: 05/15/2018 °Elsevier Patient Education © 2022 [employer] Inc. ° °

## 2021-10-04 NOTE — Progress Notes (Signed)
Established patient acute visit   Patient: [name]   DOB: [birth year]   76 y.o. Female  MRN: [id number] Visit Date: 10/04/2021  Chief Complaint  Patient presents with   Acute Visit   Subjective    HPI  Patient presents with c/o lower extremity swelling that has been on and off x several years and over the weekend it got worse. Denies chest pain, palpitations, shortness of breath at rest, lower extremity redness, recent travel, or surgeries. Reports elevation does not help with the swelling. Does monitor sodium intake. States in the past was on a water pill.    Medications: Outpatient Medications Prior to Visit  Medication Sig   aspirin 325 MG tablet Take 325 mg by mouth daily.   calamine lotion Apply 1 application topically as needed for itching.   cephALEXin (KEFLEX) 500 MG capsule Take 1 capsule (500 mg total) by mouth 4 (four) times daily.   Erenumab-aooe (AIMOVIG) 70 MG/ML SOAJ Inject 70 mg into the skin every 30 (thirty) days.   levothyroxine (SYNTHROID) 100 MCG tablet TAKE 1 TABLET BY MOUTH DAILY   mupirocin cream (BACTROBAN) 2 % Apply 1 application topically 2 (two) times daily.   ondansetron (ZOFRAN ODT) 4 MG disintegrating tablet Take 1 tablet (4 mg total) by mouth every 8 (eight) hours as needed for nausea or vomiting.   oxyCODONE (ROXICODONE) 15 MG immediate release tablet Take 1 tablet by mouth. May take up to 5 times daily   pregabalin (LYRICA) 150 MG capsule TAKE 1 CAPSULE BY MOUTH TWICE DAILY   ramelteon (ROZEREM) 8 MG tablet Take 1 tablet by mouth daily.   tiZANidine (ZANAFLEX) 4 MG tablet Take 1 tablet (4 mg total) by mouth every 6 (six) hours as needed for muscle spasms.   traZODone (DESYREL) 50 MG tablet Take 50-150 mg at bedtime as needed by mouth for sleep (and may take one additional 50 mg tablet daily AS NEEDED for part of a "headache cocktail").    triamcinolone (KENALOG) 0.025 % cream Apply 1 application topically 2 (two) times daily.   Ubrogepant  (UBRELVY) 50 MG TABS Take 50 mg by mouth as needed (may repeat dose in 2 hours, not to exceed 100 mg/day, do not take more than 8 days a month).   venlafaxine (EFFEXOR) 37.5 MG tablet TAKE 1 TABLET BY MOUTH TWICE DAILY   vortioxetine HBr (TRINTELLIX) 20 MG TABS tablet Take 20 mg by mouth daily.   No facility-administered medications prior to visit.    Review of Systems Review of Systems:  A fourteen system review of systems was performed and found to be positive as per HPI.     Objective    BP 127/72    Pulse 82    Temp 98.3 F (36.8 C)    Ht 5\' 4"  (1.626 m)    Wt 219 lb (99.3 kg)    SpO2 90%    BMI 37.59 kg/m    Physical Exam  General:  Well Developed, well nourished, appropriate for stated age.  Neuro:  Alert and oriented,  extra-ocular muscles intact  HEENT:  Normocephalic, atraumatic, neck supple  Skin:  no gross rash, warm, pink. Cardiac:  RRR, S1 S2 Respiratory: CTA B/L  Extremities/Vascular:  Ext warm, no cyanosis apprec. 1+ pitting edema of RLE, 2+ LLE, no calve tenderness of redness  Psych:  No HI/SI, judgement and insight good, Euthymic mood. Full Affect.   No results found for any visits on 10/04/21.  Assessment & Plan  Patient has no red flag s/sx for DVT, will defer imaging at this time. S/sx more suggestive of dependent edema especially with decreased mobility. Echocardiogram from 2021 revealed LVEF 6-65% so less likely CHF related. Will start diuretic therapy with Lasix 20 mg to take as needed for edema. Discussed potential side effects and recommend to ensure adequate potassium intake with diet. CMP from 07/2021- normal electrolytes and renal function. Patient and caregiver verbalized understanding. Discussed low sodium diet and to continue elevated.   Return if symptoms worsen or fail to improve.        Lorrene Reid, PA-C  [facility] at [facility] [phone] (phone) [phone] (fax)  [place]

## 2021-10-18 ENCOUNTER — Ambulatory Visit (INDEPENDENT_AMBULATORY_CARE_PROVIDER_SITE_OTHER): Payer: Medicare Other | Admitting: Nurse Practitioner

## 2021-10-18 ENCOUNTER — Other Ambulatory Visit: Payer: Self-pay

## 2021-10-18 ENCOUNTER — Encounter: Payer: Self-pay | Admitting: Nurse Practitioner

## 2021-10-18 VITALS — BP 106/68 | HR 93 | Temp 98.3°F | Ht 64.0 in | Wt 243.0 lb

## 2021-10-18 DIAGNOSIS — R319 Hematuria, unspecified: Secondary | ICD-10-CM

## 2021-10-18 DIAGNOSIS — R300 Dysuria: Secondary | ICD-10-CM | POA: Diagnosis not present

## 2021-10-18 DIAGNOSIS — N390 Urinary tract infection, site not specified: Secondary | ICD-10-CM | POA: Diagnosis not present

## 2021-10-18 LAB — POCT URINALYSIS DIP (CLINITEK)
Bilirubin, UA: NEGATIVE
Glucose, UA: NEGATIVE mg/dL
Ketones, POC UA: NEGATIVE mg/dL
Nitrite, UA: POSITIVE — AB
POC PROTEIN,UA: >=300 — AB
Spec Grav, UA: 1.025 (ref 1.010–1.025)
Urobilinogen, UA: 0.2 E.U./dL
pH, UA: 6 (ref 5.0–8.0)

## 2021-10-18 MED ORDER — NITROFURANTOIN MONOHYD MACRO 100 MG PO CAPS: 100.0000 mg | ORAL_CAPSULE | Freq: Two times a day (BID) | ORAL | 0 refills | Status: DC

## 2021-10-18 MED ORDER — PHENAZOPYRIDINE HCL 200 MG PO TABS: 200.0000 mg | ORAL_TABLET | Freq: Three times a day (TID) | ORAL | 0 refills | Status: DC | PRN

## 2021-10-18 NOTE — Progress Notes (Signed)
Established patient visit   Patient: [name]   DOB: 12-Jan-1946   76 y.o. Female  MRN: [id number] Visit Date: 10/18/2021  Chief Complaint  Patient presents with   Urinary Tract Infection   Subjective    Urinary Tract Infection  This is a new problem. The current episode started in the past 7 days. The problem occurs every urination. The problem has been gradually worsening. The quality of the pain is described as burning. She is Not sexually active. Associated symptoms include frequency, hematuria, hesitancy and urgency. Pertinent negatives include no chills, flank pain, nausea or vomiting. Associated symptoms comments: dizzy. She has tried nothing for the symptoms.     Medications: Outpatient Medications Prior to Visit  Medication Sig   aspirin 325 MG tablet Take 325 mg by mouth daily.   calamine lotion Apply 1 application topically as needed for itching.   cephALEXin (KEFLEX) 500 MG capsule Take 1 capsule (500 mg total) by mouth 4 (four) times daily.   Erenumab-aooe (AIMOVIG) 70 MG/ML SOAJ Inject 70 mg into the skin every 30 (thirty) days.   furosemide (LASIX) 20 MG tablet Take 1 tablet (20 mg total) by mouth daily as needed for edema.   levothyroxine (SYNTHROID) 100 MCG tablet TAKE 1 TABLET BY MOUTH DAILY   mupirocin cream (BACTROBAN) 2 % Apply 1 application topically 2 (two) times daily.   ondansetron (ZOFRAN ODT) 4 MG disintegrating tablet Take 1 tablet (4 mg total) by mouth every 8 (eight) hours as needed for nausea or vomiting.   oxyCODONE (ROXICODONE) 15 MG immediate release tablet Take 1 tablet by mouth. May take up to 5 times daily   pregabalin (LYRICA) 150 MG capsule TAKE 1 CAPSULE BY MOUTH TWICE DAILY   ramelteon (ROZEREM) 8 MG tablet Take 1 tablet by mouth daily.   tiZANidine (ZANAFLEX) 4 MG tablet Take 1 tablet (4 mg total) by mouth every 6 (six) hours as needed for muscle spasms.   traZODone (DESYREL) 50 MG tablet Take 50-150 mg at bedtime as needed by mouth for sleep  (and may take one additional 50 mg tablet daily AS NEEDED for part of a "headache cocktail").    triamcinolone (KENALOG) 0.025 % cream Apply 1 application topically 2 (two) times daily.   Ubrogepant (UBRELVY) 50 MG TABS Take 50 mg by mouth as needed (may repeat dose in 2 hours, not to exceed 100 mg/day, do not take more than 8 days a month).   venlafaxine (EFFEXOR) 37.5 MG tablet TAKE 1 TABLET BY MOUTH TWICE DAILY   vortioxetine HBr (TRINTELLIX) 20 MG TABS tablet Take 20 mg by mouth daily.   No facility-administered medications prior to visit.    Review of Systems  Constitutional:  Positive for fatigue. Negative for activity change, appetite change, chills and fever.  HENT:  Negative for congestion, postnasal drip, rhinorrhea, sinus pressure, sinus pain, sneezing and sore throat.   Eyes: Negative.   Respiratory:  Negative for cough, chest tightness, shortness of breath and wheezing.   Cardiovascular:  Negative for chest pain and palpitations.  Gastrointestinal:  Negative for abdominal pain, constipation, diarrhea, nausea and vomiting.  Endocrine: Negative for cold intolerance, heat intolerance, polydipsia and polyuria.  Genitourinary:  Positive for frequency, hematuria, hesitancy and urgency. Negative for dyspareunia, dysuria and flank pain.  Musculoskeletal:  Negative for arthralgias, back pain and myalgias.  Skin:  Negative for rash.  Allergic/Immunologic: Negative for environmental allergies.  Neurological:  Positive for weakness. Negative for dizziness and headaches.  Hematological:  Negative  for adenopathy.  Psychiatric/Behavioral:  The patient is not nervous/anxious.     Objective     Today's Vitals   10/18/21 1013  BP: 106/68  Pulse: 93  Temp: 98.3 F (36.8 C)  SpO2: 90%  Weight: 243 lb (110.2 kg)  Height: 5\' 4"  (1.626 m)   Body mass index is 41.71 kg/m.   Physical Exam Vitals and nursing note reviewed.  Constitutional:      Appearance: Normal appearance. She is  well-developed. She is obese. She is ill-appearing.  HENT:     Head: Normocephalic.     Nose: Nose normal.     Mouth/Throat:     Mouth: Mucous membranes are moist.     Pharynx: Oropharynx is clear.  Eyes:     Pupils: Pupils are equal, round, and reactive to light.  Cardiovascular:     Rate and Rhythm: Normal rate and regular rhythm.     Pulses: Normal pulses.     Heart sounds: Normal heart sounds.  Pulmonary:     Effort: Pulmonary effort is normal.     Breath sounds: Normal breath sounds.  Abdominal:     General: Bowel sounds are normal. There is no distension.     Palpations: Abdomen is soft. There is no mass.     Tenderness: There is no abdominal tenderness. There is no guarding or rebound.     Hernia: No hernia is present.  Genitourinary:    Comments: Urine sample is positive for nitrites. It is also positive for large blood, large WBC, and large protein.  Musculoskeletal:        General: Normal range of motion.     Cervical back: Normal range of motion and neck supple.  Lymphadenopathy:     Cervical: No cervical adenopathy.  Skin:    General: Skin is warm and dry.     Capillary Refill: Capillary refill takes less than 2 seconds.  Neurological:     General: No focal deficit present.     Mental Status: She is alert and oriented to person, place, and time.  Psychiatric:        Mood and Affect: Mood normal.        Behavior: Behavior normal.        Thought Content: Thought content normal.        Judgment: Judgment normal.     Results for orders placed or performed in visit on 10/18/21  Urine Culture   Specimen: Urine   Urine  Result Value Ref Range   Urine Culture, Routine Final report (A)    Organism ID, Bacteria Comment (A)    Antimicrobial Susceptibility Comment   POCT URINALYSIS DIP (CLINITEK)  Result Value Ref Range   Color, UA yellow yellow   Clarity, UA cloudy (A) clear   Glucose, UA negative negative mg/dL   Bilirubin, UA negative negative   Ketones, POC  UA negative negative mg/dL   Spec Grav, UA 1.025 1.010 - 1.025   Blood, UA large (A) negative   pH, UA 6.0 5.0 - 8.0   POC PROTEIN,UA >=300 (A) negative, trace   Urobilinogen, UA 0.2 0.2 or 1.0 E.U./dL   Nitrite, UA Positive (A) Negative   Leukocytes, UA Large (3+) (A) Negative    Assessment & Plan     1. Urinary tract infection with hematuria, site unspecified Start patient on macrobid bid ro 7 days. Send urine for culture and sensitivity and adjust antibiotics as indicated.  - POCT URINALYSIS DIP (CLINITEK) - Urine Culture;  Future - Urine Culture - nitrofurantoin, macrocrystal-monohydrate, (MACROBID) 100 MG capsule; Take 1 capsule (100 mg total) by mouth 2 (two) times daily.  Dispense: 14 capsule; Refill: 0  2. Dysuria Pyridium 200mg  may be taken up to three times daily as needed for pain and burning with urination.  - phenazopyridine (PYRIDIUM) 200 MG tablet; Take 1 tablet (200 mg total) by mouth 3 (three) times daily as needed for pain.  Dispense: 10 tablet; Refill: 0   Return for prn worsening or persistent symptoms, as scheduled.        Ronnell Freshwater, NP  [facility] at [facility] [phone] (phone) [phone] (fax)  [place]

## 2021-10-18 NOTE — Progress Notes (Signed)
Started patient on macrobid 100mg  bid for 7 days. Will adjust medication based on results of culture and sensitivity.

## 2021-10-21 LAB — URINE CULTURE

## 2021-10-24 DIAGNOSIS — N390 Urinary tract infection, site not specified: Secondary | ICD-10-CM | POA: Insufficient documentation

## 2021-10-24 DIAGNOSIS — R300 Dysuria: Secondary | ICD-10-CM | POA: Insufficient documentation

## 2021-11-04 ENCOUNTER — Ambulatory Visit (HOSPITAL_COMMUNITY): Payer: Medicare Other

## 2021-11-04 ENCOUNTER — Ambulatory Visit (INDEPENDENT_AMBULATORY_CARE_PROVIDER_SITE_OTHER): Payer: Medicare Other

## 2021-11-04 ENCOUNTER — Encounter (HOSPITAL_COMMUNITY): Payer: Self-pay

## 2021-11-04 ENCOUNTER — Ambulatory Visit (HOSPITAL_COMMUNITY)
Admission: EM | Admit: 2021-11-04 | Discharge: 2021-11-04 | Disposition: A | Discharge status: Home / Self Care | Payer: Medicare Other | Attending: Family Medicine | Admitting: Family Medicine

## 2021-11-04 ENCOUNTER — Other Ambulatory Visit: Payer: Self-pay

## 2021-11-04 DIAGNOSIS — R0789 Other chest pain: Secondary | ICD-10-CM

## 2021-11-04 DIAGNOSIS — R0782 Intercostal pain: Secondary | ICD-10-CM | POA: Diagnosis not present

## 2021-11-04 DIAGNOSIS — R0781 Pleurodynia: Secondary | ICD-10-CM | POA: Diagnosis not present

## 2021-11-04 MED ORDER — KETOROLAC TROMETHAMINE 30 MG/ML IJ SOLN
30.0000 mg | Freq: Once | INTRAMUSCULAR | Status: AC
  Administered 2021-11-04: 30 mg via INTRAMUSCULAR

## 2021-11-04 MED ORDER — KETOROLAC TROMETHAMINE 30 MG/ML IJ SOLN
INTRAMUSCULAR | Status: AC
  Filled 2021-11-04: qty 1

## 2021-11-04 MED ORDER — IBUPROFEN 400 MG PO TABS: 400.0000 mg | ORAL_TABLET | Freq: Four times a day (QID) | ORAL | 0 refills | Status: DC | PRN
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
## 2021-11-04 NOTE — Discharge Instructions (Addendum)
X-rays we did today did not show any new fractures. ? ?Take ibuprofen 400 mg 1 tablet every 6 hours as needed for pain. ? ?You have been given an injection of Toradol 30 mg today ? ?See your primary care office in the next week or 2 ? ?If you are worsening in any way proceed to the emergency room for possible advanced imaging  ?
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
## 2021-11-07 ENCOUNTER — Ambulatory Visit: Payer: Medicare Other | Admitting: Nurse Practitioner

## 2021-11-24 NOTE — Progress Notes (Signed)
? ?Established Patient Office Visit ? ?Subjective:  ?Patient ID: [name], female    DOB: [birth year]  Age: 76 y.o. MRN: [id number] ? ?CC:  ?Chief Complaint  ?Patient presents with  ? Follow-up  ?  Mood  ? Thyroid Problem  ? Hypertension  ? ? ?HPI ?[name] presents for follow up on mood, hypothyroidism and BP. Patient is accompanied by her son. Patient has c/o vaginal itching. Denies vaginal discharge, fever, lower abdominal or n/v. Symptoms ongoing for several weeks. ? ?Mood: Followed by psychiatry, Dr. Toy Care. Taking medications as directed without issues. Denies worsening anxiety. No SI/HI. ? ?Hypothyroidism: Reports medication compliance.  ? ?Edema: Reports Lasix 20 mg has not been very effective. Continues to have lower extremity swelling. Tries to elevate her legs as much as possible.  ? ? ? ?Past Medical History:  ?Diagnosis Date  ? Anemia   ? took Fe- orally 2 yrs. ago   ? Anxiety   ? panic attacks   ? Arthritis   ? shoulders, knees  ? Cancer [facility])   ? skin- preCA  ? Depression   ? GERD (gastroesophageal reflux disease)   ? uses Protonix as needed   ? Headache(784.0)   ? Humerus fracture   ? Right   ? Hyposmolality and/or hyponatremia 02/27/2013  ? Hypothyroidism   ? IBS (irritable bowel syndrome)   ? Memory loss   ? Mental disorder   ? Migraine   ? Preoperative examination 02/26/2013  ? Seizures ([place])   ? caused by depression , seritonin seizure - effected short term memory    ? Shortness of breath   ? ? ?Past Surgical History:  ?Procedure Laterality Date  ? ABDOMINAL HYSTERECTOMY    ? APPENDECTOMY    ? BACK SURGERY  1990's  ? BREAST SURGERY  1972  ? augmentation   ? CHOLECYSTECTOMY    ? GASTRECTOMY  1994  ? Due to ulcer, required multiple revisions.   ? HERNIA REPAIR    ? ORIF ANKLE FRACTURE Right 2004  ? REPLACEMENT TOTAL KNEE    ? REVERSE SHOULDER ARTHROPLASTY Right 05/16/2013  ? Procedure: RIGHT HEMI ARTHROPLASTY  VERSES  REVERSE TOTAL SHOULDER ARTHROPLASTY ;  Surgeon: Augustin Schooling, MD;   Location: [facility];  Service: Orthopedics;  Laterality: Right;  ? [place] SURGERY  2005  ? Lumbar spinal stenosis   ? STOMACH SURGERY    ? 1/3 resection for obstruction  ? TONSILLECTOMY    ? TUBAL LIGATION    ? ? ?Family History  ?Problem Relation Age of Onset  ? Aneurysm Mother   ? Heart disease Mother   ? Cancer Father   ?     pancreatic  ? Stroke Father 64  ? Hypertension Father   ? Alcohol abuse Father   ? Heart disease Maternal Grandfather   ? ? ?Social History  ? ?Socioeconomic History  ? Marital status: Married  ?  Spouse name: Not on file  ? Number of children: 1  ? Years of education: College  ? Highest education level: Not on file  ?Occupational History  ? Occupation: Retired  ?Tobacco Use  ? Smoking status: Never  ? Smokeless tobacco: Never  ?Vaping Use  ? Vaping Use: Never used  ?Substance and Sexual Activity  ? Alcohol use: Yes  ?  Comment: rare use of white wine   ? Drug use: No  ? Sexual activity: Not on file  ?Other Topics Concern  ? Not on file  ?Social  History Narrative  ? Lives at home with her husband.  ? 2 cups caffeine daily.  ? Right-handed.  ? ?Social Determinants of Health  ? ?Financial Resource Strain: Not on file  ?Food Insecurity: Not on file  ?Transportation Needs: Not on file  ?Physical Activity: Not on file  ?Stress: Not on file  ?Social Connections: Not on file  ?Intimate Partner Violence: Not on file  ? ? ?Outpatient Medications Prior to Visit  ?Medication Sig Dispense Refill  ? aspirin 325 MG tablet Take 325 mg by mouth daily.    ? calamine lotion Apply 1 application topically as needed for itching. 120 mL 0  ? Erenumab-aooe (AIMOVIG) 70 MG/ML SOAJ Inject 70 mg into the skin every 30 (thirty) days. 1 mL 11  ? levothyroxine (SYNTHROID) 100 MCG tablet TAKE 1 TABLET BY MOUTH DAILY 90 tablet 1  ? oxyCODONE (ROXICODONE) 15 MG immediate release tablet Take 1 tablet by mouth. May take up to 5 times daily    ? pregabalin (LYRICA) 150 MG capsule TAKE 1 CAPSULE BY MOUTH TWICE DAILY 180 capsule 1   ? ramelteon (ROZEREM) 8 MG tablet Take 1 tablet by mouth daily.    ? tiZANidine (ZANAFLEX) 4 MG tablet Take 1 tablet (4 mg total) by mouth every 6 (six) hours as needed for muscle spasms. 30 tablet 6  ? traZODone (DESYREL) 50 MG tablet Take 50-150 mg at bedtime as needed by mouth for sleep (and may take one additional 50 mg tablet daily AS NEEDED for part of a "headache cocktail").     ? Ubrogepant (UBRELVY) 50 MG TABS Take 50 mg by mouth as needed (may repeat dose in 2 hours, not to exceed 100 mg/day, do not take more than 8 days a month). 12 tablet 11  ? venlafaxine (EFFEXOR) 37.5 MG tablet TAKE 1 TABLET BY MOUTH TWICE DAILY 180 tablet 1  ? vortioxetine HBr (TRINTELLIX) 20 MG TABS tablet Take 20 mg by mouth daily.    ? furosemide (LASIX) 20 MG tablet Take 1 tablet (20 mg total) by mouth daily as needed for edema. 90 tablet 0  ? ibuprofen (ADVIL) 400 MG tablet Take 1 tablet (400 mg total) by mouth every 6 (six) hours as needed. 20 tablet 0  ? mupirocin cream (BACTROBAN) 2 % Apply 1 application topically 2 (two) times daily. 30 g 2  ? phenazopyridine (PYRIDIUM) 200 MG tablet Take 1 tablet (200 mg total) by mouth 3 (three) times daily as needed for pain. 10 tablet 0  ? triamcinolone (KENALOG) 0.025 % cream Apply 1 application topically 2 (two) times daily. 80 g 2  ? ?No facility-administered medications prior to visit.  ? ? ?Allergies  ?Allergen Reactions  ? Bupropion Hcl Other (See Comments)  ?  SEIZURES!!  ? Dairy Aid [Tilactase] Diarrhea  ? Other   ? Penicillins   ? Azithromycin Rash  ? Erythromycin Rash  ? ? ?  11/25/2021  ? 10:22 AM 10/18/2021  ? 10:16 AM 10/04/2021  ?  2:37 PM 07/22/2021  ?  9:56 AM 12/27/2020  ?  9:39 AM  ?Depression screen PHQ 2/9  ?Decreased Interest 0 1 1 0 2  ?Down, Depressed, Hopeless 0 1 1 0 1  ?PHQ - 2 Score 0 2 2 0 3  ?Altered sleeping 3 0 1 0 3  ?Tired, decreased energy '1 3 1 '$ 0 3  ?Change in appetite 3 0 0 0 2  ?Feeling bad or failure about yourself  '3 1 1 '$ 0 1  ?  Trouble concentrating 0 0  0 0 1  ?Moving slowly or fidgety/restless 0 0 0 0 0  ?Suicidal thoughts 0 0 0 0 0  ?PHQ-9 Score '10 6 5 '$ 0 13  ?Difficult doing work/chores Somewhat difficult  Not difficult at all    ? ? ?  11/25/2021  ? 10:23 AM 10/18/2021  ? 10:16 AM 10/04/2021  ?  2:37 PM 07/22/2021  ?  9:57 AM  ?GAD 7 : Generalized Anxiety Score  ?Nervous, Anxious, on Edge '2 1 1 '$ 0  ?Control/stop worrying '3 1 1 '$ 0  ?Worry too much - different things '3 1 1 '$ 0  ?Trouble relaxing '2 1 1 '$ 0  ?Restless 0 0 0 0  ?Easily annoyed or irritable '1 1 1 '$ 0  ?Afraid - awful might happen 1 1 0 0  ?Total GAD 7 Score '12 6 5 '$ 0  ?Anxiety Difficulty Somewhat difficult  Not difficult at all   ? ? ? ? ?ROS ?Review of Systems ?Review of Systems:  ?A fourteen system review of systems was performed and found to be positive as per HPI. ? ?  ?Objective:  ?  ?Physical Exam ?General:  Pleasant and cooperative, appropriate for stated age.  ?Neuro:  Alert and oriented,  extra-ocular muscles intact  ?HEENT:  Normocephalic, atraumatic, neck supple  ?Skin:  no gross rash, warm, pink. ?Cardiac:  RRR, S1 S2, +murmur ?Respiratory: CTA B/L  ?Vascular:  Ext warm, no cyanosis apprec.; cap RF less 2 sec. 1+ pitting edema RLE, 2+ pitting edema LLE ?Psych:  No HI/SI, judgement and insight good, Euthymic mood. Full Affect. ? ? ?BP 128/86   Pulse 90   Temp 97.6 ?F (36.4 ?C)   Ht '5\' 4"'$  (1.626 m)   Wt 243 lb (110.2 kg)   SpO2 98%   BMI 41.71 kg/m?  ?Wt Readings from Last 3 Encounters:  ?11/25/21 243 lb (110.2 kg)  ?10/18/21 243 lb (110.2 kg)  ?10/04/21 219 lb (99.3 kg)  ? ? ? ?Health Maintenance Due  ?Topic Date Due  ? Zoster Vaccines- Shingrix (1 of 2) Never done  ? TETANUS/TDAP  02/02/2013  ? COVID-19 Vaccine (2 - Pfizer risk series) 01/18/2021  ? ? ?There are no preventive care reminders to display for this patient. ? ?Lab Results  ?Component Value Date  ? TSH 3.120 07/22/2021  ? ?Lab Results  ?Component Value Date  ? WBC 6.2 07/22/2021  ? HGB 11.3 07/22/2021  ? HCT 36.7 07/22/2021  ? MCV 77  (L) 07/22/2021  ? PLT 218 07/22/2021  ? ?Lab Results  ?Component Value Date  ? NA 137 07/22/2021  ? K 4.6 07/22/2021  ? CO2 23 07/22/2021  ? GLUCOSE 83 07/22/2021  ? BUN 16 07/22/2021  ? CREATININE 0.90 07/22/2021

## 2021-11-25 ENCOUNTER — Ambulatory Visit (INDEPENDENT_AMBULATORY_CARE_PROVIDER_SITE_OTHER): Payer: Medicare Other | Admitting: Physician Assistant

## 2021-11-25 ENCOUNTER — Encounter: Payer: Self-pay | Admitting: Physician Assistant

## 2021-11-25 ENCOUNTER — Other Ambulatory Visit: Payer: Self-pay

## 2021-11-25 VITALS — BP 128/86 | HR 90 | Temp 97.6°F | Ht 64.0 in | Wt 243.0 lb

## 2021-11-25 DIAGNOSIS — F1120 Opioid dependence, uncomplicated: Secondary | ICD-10-CM

## 2021-11-25 DIAGNOSIS — F339 Major depressive disorder, recurrent, unspecified: Secondary | ICD-10-CM

## 2021-11-25 DIAGNOSIS — E039 Hypothyroidism, unspecified: Secondary | ICD-10-CM

## 2021-11-25 DIAGNOSIS — R600 Localized edema: Secondary | ICD-10-CM

## 2021-11-25 DIAGNOSIS — N761 Subacute and chronic vaginitis: Secondary | ICD-10-CM

## 2021-11-25 DIAGNOSIS — I272 Pulmonary hypertension, unspecified: Secondary | ICD-10-CM | POA: Diagnosis not present

## 2021-11-25 MED ORDER — CLOTRIMAZOLE-BETAMETHASONE 1-0.05 % EX CREA: 1.0000 "application " | TOPICAL_CREAM | Freq: Every day | CUTANEOUS | 0 refills | Status: DC

## 2021-11-25 MED ORDER — FUROSEMIDE 20 MG PO TABS: 20.0000 mg | ORAL_TABLET | Freq: Two times a day (BID) | ORAL | 0 refills | Status: DC

## 2021-11-25 MED ORDER — FLUCONAZOLE 150 MG PO TABS: 150.0000 mg | ORAL_TABLET | Freq: Once | ORAL | 0 refills | Status: AC

## 2021-11-25 NOTE — Patient Instructions (Signed)
Edema ?Edema is when you have too much fluid in your body or under your skin. Edema may make your legs, feet, and ankles swell. Swelling often happens in looser tissues, such as around your eyes. This is a common condition. It gets more common as you get older. ?There are many possible causes of edema. These include: ?Eating too much salt (sodium). ?Being on your feet or sitting for a long time. ?Certain medical conditions, such as: ?Pregnancy. ?Heart failure. ?Liver disease. ?Kidney disease. ?Cancer. ?Hot weather may make edema worse. Edema is usually painless. Your skin may look swollen or shiny. ?Follow these instructions at home: ?Medicines ?Take over-the-counter and prescription medicines only as told by your doctor. ?Your doctor may prescribe a medicine to help your body get rid of extra water (diuretic). Take this medicine if you are told to take it. ?Eating and drinking ?Eat a low-salt (low-sodium) diet as told by your doctor. Sometimes, eating less salt may reduce swelling. ?Depending on the cause of your swelling, you may need to limit how much fluid you drink (fluid restriction). ?General instructions ?Raise the injured area above the level of your heart while you are sitting or lying down. ?Do not sit still or stand for a long time. ?Do not wear tight clothes. Do not wear garters on your upper legs. ?Exercise your legs. This can help the swelling go down. ?Wear compression stockings as told by your doctor. It is important that these are the right size. These should be prescribed by your doctor to prevent possible injuries. ?If elastic bandages or wraps are recommended, use them as told by your doctor. ?Contact a doctor if: ?Treatment is not working. ?You have heart, liver, or kidney disease and have symptoms of edema. ?You have sudden and unexplained weight gain. ?Get help right away if: ?You have shortness of breath or chest pain. ?You cannot breathe when you lie down. ?You have pain, redness, or warmth  in the swollen areas. ?You have heart, liver, or kidney disease and get edema all of a sudden. ?You have a fever and your symptoms get worse all of a sudden. ?These symptoms may be an emergency. Get help right away. Call 911. ?Do not wait to see if the symptoms will go away. ?Do not drive yourself to the hospital. ?Summary ?Edema is when you have too much fluid in your body or under your skin. ?Edema may make your legs, feet, and ankles swell. Swelling often happens in looser tissues, such as around your eyes. ?Raise the injured area above the level of your heart while you are sitting or lying down. ?Follow your doctor's instructions about diet and how much fluid you can drink. ?This information is not intended to replace advice given to you by your health care provider. Make sure you discuss any questions you have with your health care provider. ?Document Revised: 04/25/2021 Document Reviewed: 04/25/2021 ?Elsevier Patient Education ? 2022 [employer] ? ?

## 2021-11-25 NOTE — Assessment & Plan Note (Signed)
-  Followed by Orthopedics ([facility]) and manage pain medications. ?

## 2021-11-25 NOTE — Assessment & Plan Note (Signed)
-  Last TSH wnl at 3.120. ?-Continue current medication regimen. ?-Will continue to monitor. ? ?

## 2021-11-26 LAB — COMPREHENSIVE METABOLIC PANEL
ALT: 8 IU/L (ref 0–32)
AST: 18 IU/L (ref 0–40)
Albumin/Globulin Ratio: 1.4 (ref 1.2–2.2)
Albumin: 3.9 g/dL (ref 3.7–4.7)
Alkaline Phosphatase: 89 IU/L (ref 44–121)
BUN/Creatinine Ratio: 11 — ABNORMAL LOW (ref 12–28)
BUN: 11 mg/dL (ref 8–27)
Bilirubin Total: 0.4 mg/dL (ref 0.0–1.2)
CO2: 25 mmol/L (ref 20–29)
Calcium: 8.7 mg/dL (ref 8.7–10.3)
Chloride: 98 mmol/L (ref 96–106)
Creatinine, Ser: 0.96 mg/dL (ref 0.57–1.00)
Globulin, Total: 2.8 g/dL (ref 1.5–4.5)
Glucose: 141 mg/dL — ABNORMAL HIGH (ref 70–99)
Potassium: 3.9 mmol/L (ref 3.5–5.2)
Sodium: 137 mmol/L (ref 134–144)
Total Protein: 6.7 g/dL (ref 6.0–8.5)
eGFR: 61 mL/min/{1.73_m2} (ref >59)

## 2021-12-01 ENCOUNTER — Ambulatory Visit: Payer: Medicare Other | Admitting: Neurology
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
## 2021-12-06 ENCOUNTER — Ambulatory Visit (INDEPENDENT_AMBULATORY_CARE_PROVIDER_SITE_OTHER): Payer: Medicare Other | Admitting: Neurology

## 2021-12-06 ENCOUNTER — Other Ambulatory Visit: Payer: Self-pay | Admitting: Physician Assistant

## 2021-12-06 ENCOUNTER — Encounter: Payer: Self-pay | Admitting: Neurology

## 2021-12-06 ENCOUNTER — Encounter: Payer: Self-pay | Admitting: Oncology

## 2021-12-06 VITALS — BP 125/68 | HR 100 | Ht 64.0 in | Wt 240.5 lb

## 2021-12-06 DIAGNOSIS — F03918 Unspecified dementia, unspecified severity, with other behavioral disturbance: Secondary | ICD-10-CM | POA: Diagnosis not present

## 2021-12-06 DIAGNOSIS — G43709 Chronic migraine without aura, not intractable, without status migrainosus: Secondary | ICD-10-CM | POA: Diagnosis not present

## 2021-12-06 DIAGNOSIS — R600 Localized edema: Secondary | ICD-10-CM

## 2021-12-06 MED ORDER — AIMOVIG 70 MG/ML ~~LOC~~ SOAJ: 70.0000 mg | SUBCUTANEOUS | 11 refills | Status: DC

## 2021-12-06 MED ORDER — VENLAFAXINE HCL 37.5 MG PO TABS: 37.5000 mg | ORAL_TABLET | Freq: Two times a day (BID) | ORAL | 3 refills | Status: DC

## 2021-12-06 MED ORDER — UBRELVY 50 MG PO TABS: 50.0000 mg | ORAL_TABLET | ORAL | 11 refills | Status: DC | PRN

## 2021-12-06 NOTE — Patient Instructions (Signed)
Next refill of Lyrica will try 100 mg twice daily ?Keep other medications ?Call to schedule with psychiatry  ?See you back in 1 year  ?

## 2021-12-12 ENCOUNTER — Other Ambulatory Visit: Payer: Self-pay

## 2021-12-12 ENCOUNTER — Telehealth: Payer: Self-pay | Admitting: Physician Assistant

## 2021-12-12 DIAGNOSIS — R600 Localized edema: Secondary | ICD-10-CM

## 2021-12-12 MED ORDER — FUROSEMIDE 20 MG PO TABS: 20.0000 mg | ORAL_TABLET | Freq: Two times a day (BID) | ORAL | 0 refills | Status: DC

## 2021-12-12 NOTE — Telephone Encounter (Signed)
Patient requesting a refill of Lasix and sent to [facility]. Please advise.  ?

## 2021-12-12 NOTE — Telephone Encounter (Signed)
Refill sent.

## 2022-01-02 ENCOUNTER — Ambulatory Visit: Payer: Medicare Other

## 2022-01-02 ENCOUNTER — Other Ambulatory Visit: Payer: Self-pay | Admitting: Physician Assistant

## 2022-01-02 DIAGNOSIS — R600 Localized edema: Secondary | ICD-10-CM

## 2022-01-03 ENCOUNTER — Other Ambulatory Visit: Payer: Self-pay | Admitting: Neurology

## 2022-01-08 IMAGING — DX DG CHEST 2V
2 series · 2 of 2 positions shown · non-contrast
Comparison: 09/09/2020

CLINICAL DATA: Chest pain

EXAM:
CHEST - 2 VIEW

[chest lat]
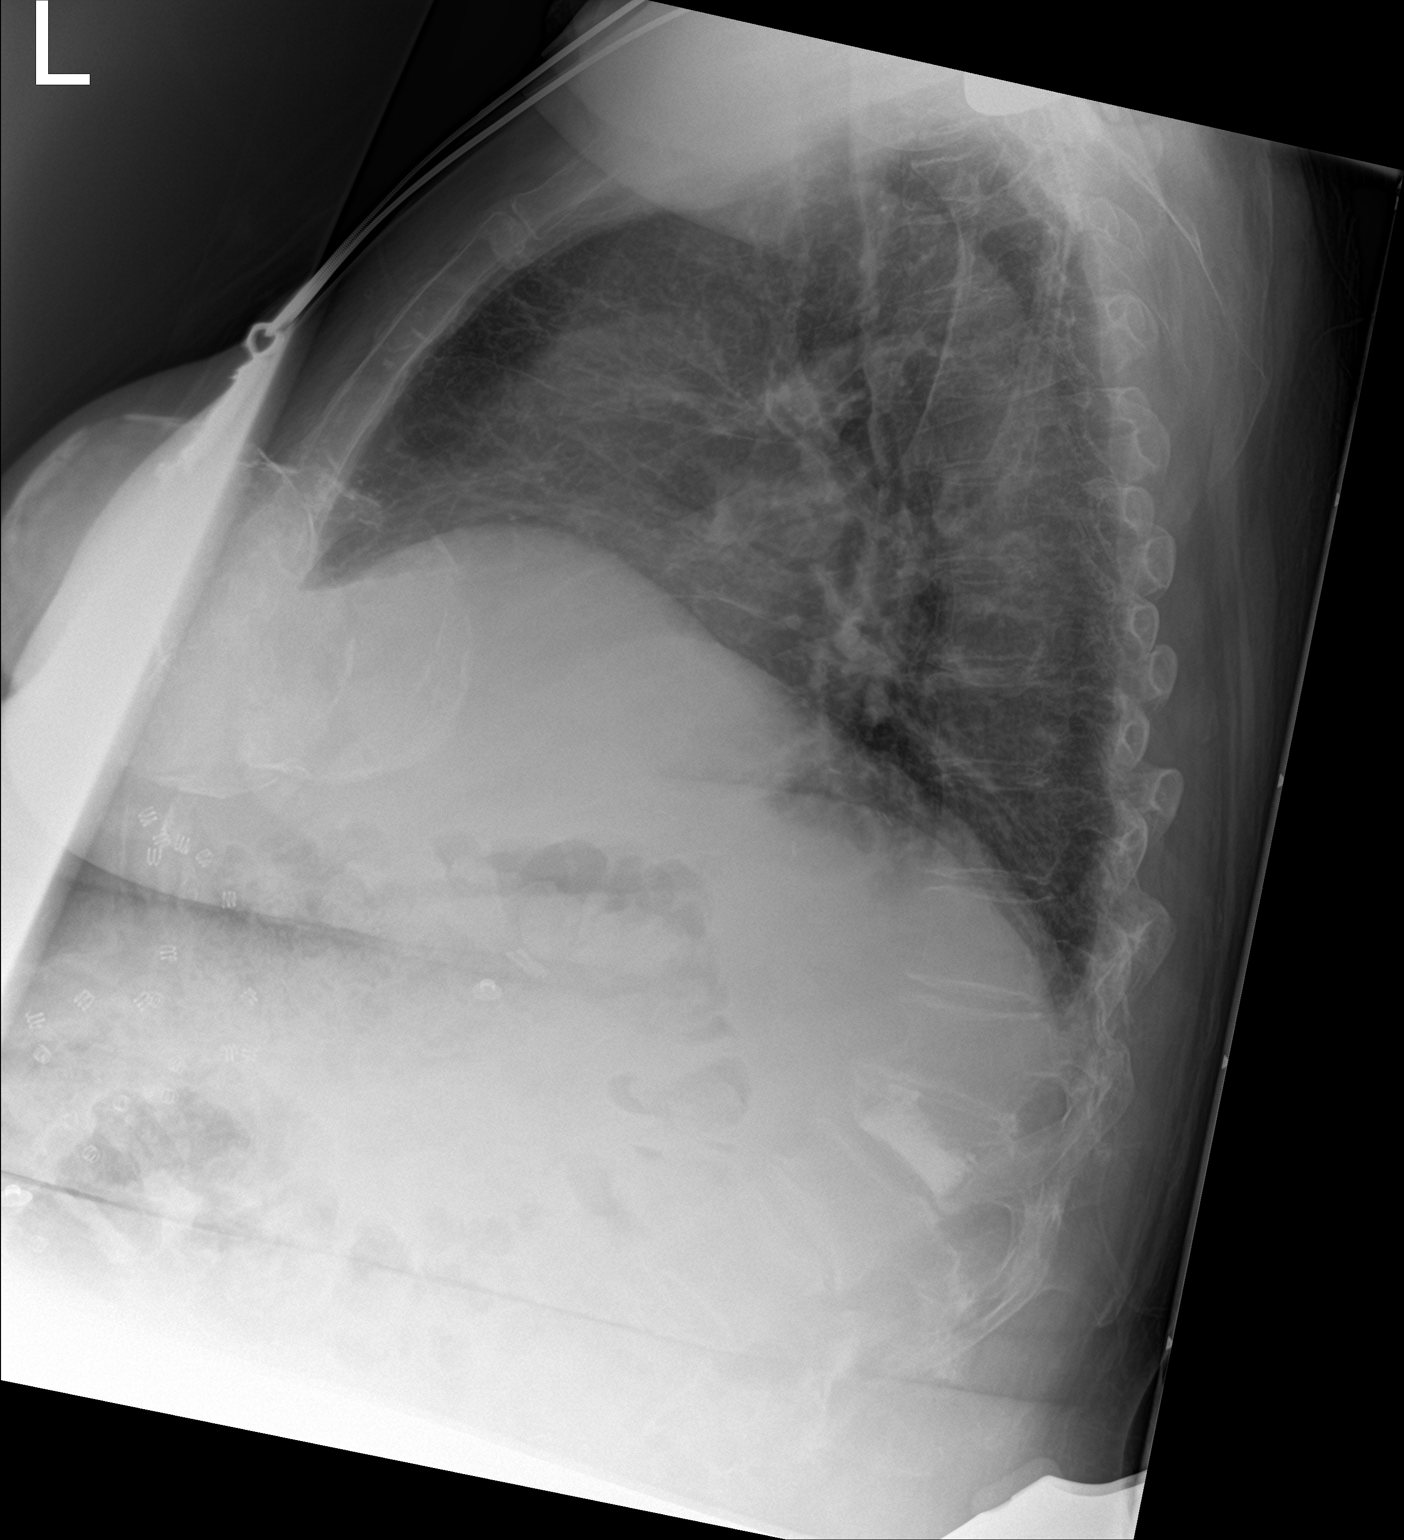

[chest ap]
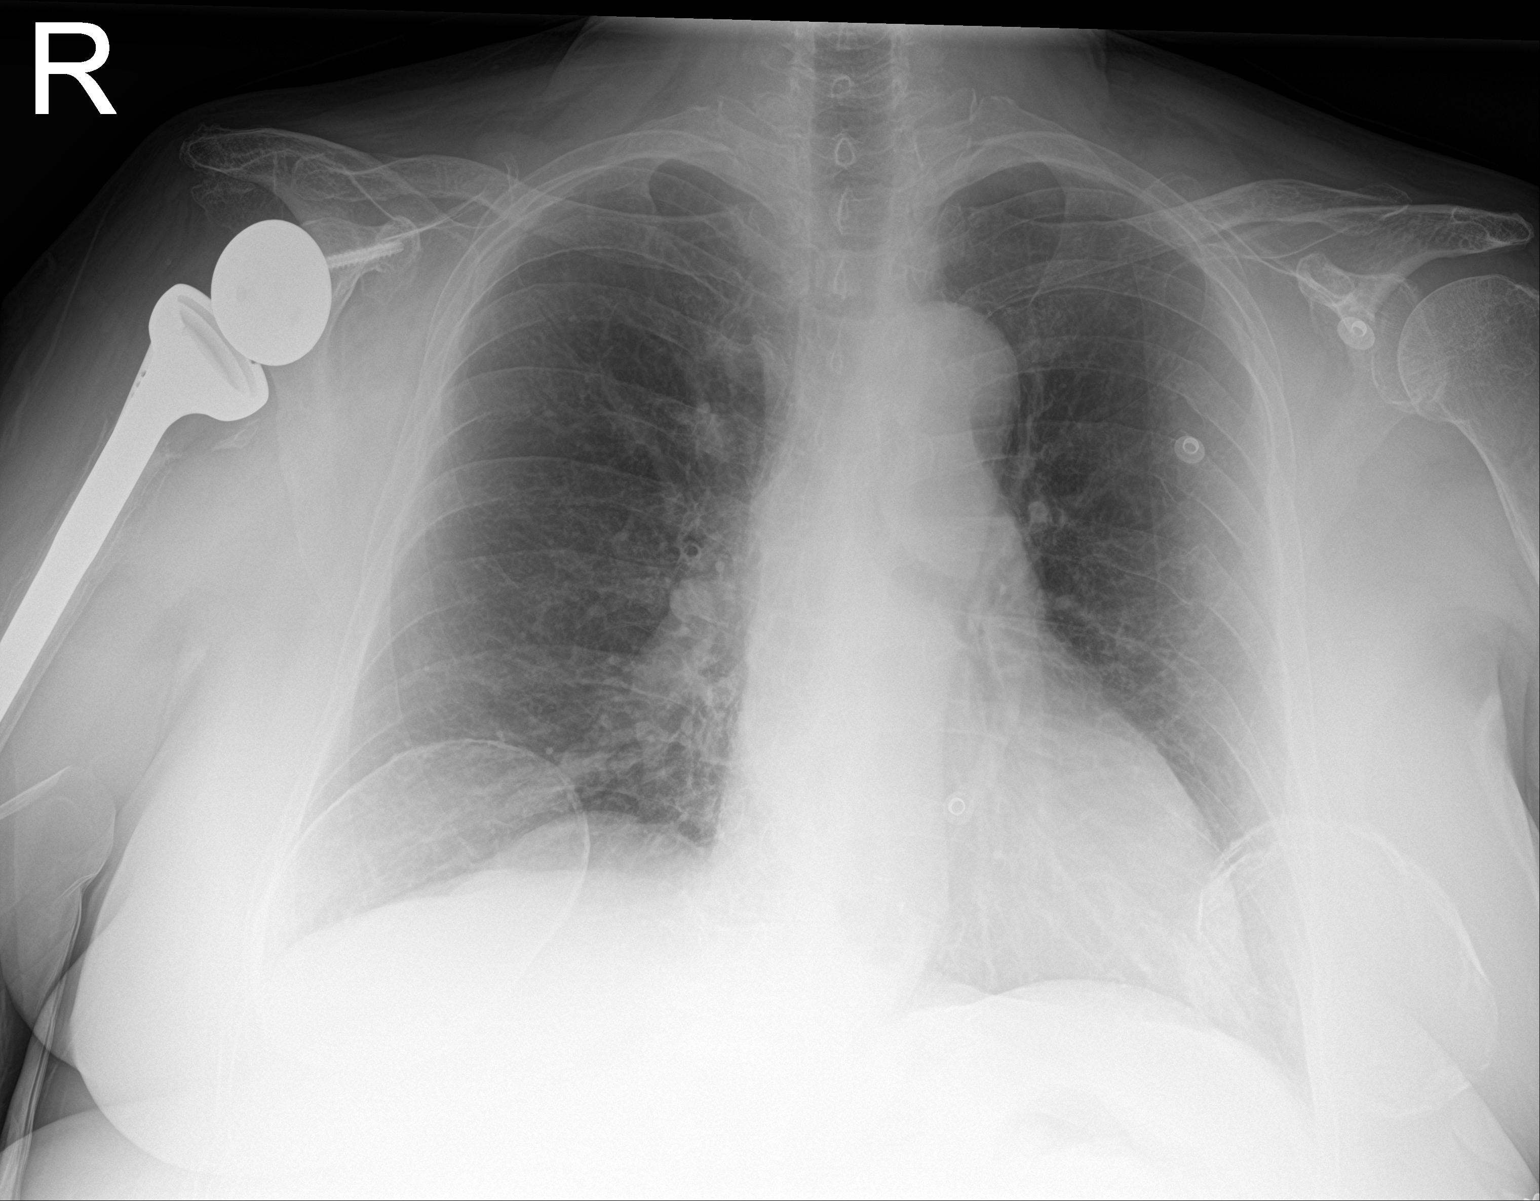

[2 of 2 positions shown; findings below may reference images not displayed]

FINDINGS: Cardiac shadow is within normal limits. Lungs are clear bilaterally.
Calcified breast implants are seen bilaterally. Right shoulder
replacement is noted. No acute bony abnormality is seen. Changes of
prior vertebral augmentation at the thoracolumbar junction are seen.
IMPRESSION: No acute abnormality noted.

## 2022-01-09 ENCOUNTER — Other Ambulatory Visit: Payer: Self-pay | Admitting: Physician Assistant

## 2022-01-09 DIAGNOSIS — R600 Localized edema: Secondary | ICD-10-CM

## 2022-01-11 ENCOUNTER — Other Ambulatory Visit: Payer: Self-pay | Admitting: Physician Assistant

## 2022-01-11 DIAGNOSIS — R600 Localized edema: Secondary | ICD-10-CM

## 2022-01-16 ENCOUNTER — Other Ambulatory Visit: Payer: Self-pay | Admitting: Physician Assistant

## 2022-01-18 ENCOUNTER — Encounter: Payer: Self-pay | Admitting: Physician Assistant

## 2022-01-18 ENCOUNTER — Ambulatory Visit (INDEPENDENT_AMBULATORY_CARE_PROVIDER_SITE_OTHER): Payer: Medicare Other | Admitting: Physician Assistant

## 2022-01-18 VITALS — BP 120/68 | HR 82 | Temp 97.7°F | Ht 64.0 in | Wt 240.0 lb

## 2022-01-18 DIAGNOSIS — N761 Subacute and chronic vaginitis: Secondary | ICD-10-CM

## 2022-01-18 DIAGNOSIS — B9689 Other specified bacterial agents as the cause of diseases classified elsewhere: Secondary | ICD-10-CM

## 2022-01-18 DIAGNOSIS — J22 Unspecified acute lower respiratory infection: Secondary | ICD-10-CM | POA: Diagnosis not present

## 2022-01-18 MED ORDER — ALBUTEROL SULFATE HFA 108 (90 BASE) MCG/ACT IN AERS
2.0000 | INHALATION_SPRAY | Freq: Four times a day (QID) | RESPIRATORY_TRACT | 0 refills | Status: DC | PRN
Start: 2022-01-18 — End: 2022-08-21

## 2022-01-18 MED ORDER — FLUCONAZOLE 150 MG PO TABS: 150.0000 mg | ORAL_TABLET | Freq: Every day | ORAL | 0 refills | Status: DC

## 2022-01-18 MED ORDER — DOXYCYCLINE HYCLATE 100 MG PO TABS: 100.0000 mg | ORAL_TABLET | Freq: Two times a day (BID) | ORAL | 0 refills | Status: AC

## 2022-01-18 NOTE — Progress Notes (Signed)
?Established patient acute visit ? ? ?Patient: [name]   DOB: 09/08/1945   76 y.o. Female  MRN: [id number] ?Visit Date: 01/18/2022 ? ?Chief Complaint  ?Patient presents with  ? Cough  ? ?Subjective  ?  ?HPI  ?Patient presents with c/o cough x 2 weeks. Cough is now productive with green sputum. Patient having some nasal congestion and runny nose. Also reports shortness of breath and wheezing which mostly happens at night. Has tried Robitussin DM which provided minimal relief. No fever, earache, sinus pressure or chest pain. Also complains of vaginal itching. No vaginal discharge.  ? ? ? ?Medications: ?Outpatient Medications Prior to Visit  ?Medication Sig  ? aspirin 325 MG tablet Take 325 mg by mouth daily.  ? calamine lotion Apply 1 application topically as needed for itching.  ? clotrimazole-betamethasone (LOTRISONE) cream Apply 1 application. topically daily.  ? Erenumab-aooe (AIMOVIG) 70 MG/ML SOAJ Inject 70 mg into the skin every 30 (thirty) days.  ? furosemide (LASIX) 20 MG tablet TAKE 1 TABLET BY MOUTH TWICE DAILY  ? levothyroxine (SYNTHROID) 100 MCG tablet TAKE 1 TABLET BY MOUTH DAILY  ? oxyCODONE (ROXICODONE) 15 MG immediate release tablet Take 1 tablet by mouth. May take up to 5 times daily  ? pregabalin (LYRICA) 150 MG capsule TAKE 1 CAPSULE BY MOUTH TWICE DAILY  ? ramelteon (ROZEREM) 8 MG tablet Take 1 tablet by mouth daily.  ? tiZANidine (ZANAFLEX) 4 MG tablet Take 1 tablet (4 mg total) by mouth every 6 (six) hours as needed for muscle spasms.  ? traZODone (DESYREL) 50 MG tablet Take 50-150 mg at bedtime as needed by mouth for sleep (and may take one additional 50 mg tablet daily AS NEEDED for part of a "headache cocktail").   ? Ubrogepant (UBRELVY) 50 MG TABS Take 50 mg by mouth as needed (may repeat dose in 2 hours, not to exceed 100 mg/day, do not take more than 8 days a month).  ? venlafaxine (EFFEXOR) 37.5 MG tablet Take 1 tablet (37.5 mg total) by mouth 2 (two) times daily.  ? vortioxetine HBr  (TRINTELLIX) 20 MG TABS tablet Take 20 mg by mouth daily.  ? ?No facility-administered medications prior to visit.  ? ? ?Review of Systems ?Review of Systems:  ?A fourteen system review of systems was performed and found to be positive as per HPI. ? ?Last CBC ?Lab Results  ?Component Value Date  ? WBC 6.2 07/22/2021  ? HGB 11.3 07/22/2021  ? HCT 36.7 07/22/2021  ? MCV 77 (L) 07/22/2021  ? MCH 23.6 (L) 07/22/2021  ? RDW 16.2 (H) 07/22/2021  ? PLT 218 07/22/2021  ? ?Last metabolic panel ?Lab Results  ?Component Value Date  ? GLUCOSE 141 (H) 11/25/2021  ? NA 137 11/25/2021  ? K 3.9 11/25/2021  ? CL 98 11/25/2021  ? CO2 25 11/25/2021  ? BUN 11 11/25/2021  ? CREATININE 0.96 11/25/2021  ? EGFR 61 11/25/2021  ? CALCIUM 8.7 11/25/2021  ? PHOS (L) 03/06/2009  ?  1.3 QA FLAGS MODIFIED BY DEMOGRAPHIC UPDATE ON 08/02 AT 0000 QA FLAGS MODIFIED BY DEMOGRAPHIC UPDATE ON 08/12 AT 0956  ? PROT 6.7 11/25/2021  ? ALBUMIN 3.9 11/25/2021  ? LABGLOB 2.8 11/25/2021  ? AGRATIO 1.4 11/25/2021  ? BILITOT 0.4 11/25/2021  ? ALKPHOS 89 11/25/2021  ? AST 18 11/25/2021  ? ALT 8 11/25/2021  ? ANIONGAP 10 03/15/2021  ? ?Last lipids ?Lab Results  ?Component Value Date  ? CHOL 179 02/04/2019  ? HDL  83 02/04/2019  ? [place] 78 02/04/2019  ? LDLDIRECT 91 03/04/2021  ? TRIG 89 02/04/2019  ? CHOLHDL 2.2 02/04/2019  ? ?Last hemoglobin A1c ?Lab Results  ?Component Value Date  ? HGBA1C 5.8 (H) 07/22/2021  ? ?Last thyroid functions ?Lab Results  ?Component Value Date  ? TSH 3.120 07/22/2021  ? ?Last vitamin D ?No results found for: 25OHVITD2, [place], VD25OH ?  Objective  ?  ?BP 120/68   Pulse 82   Temp 97.7 ?F (36.5 ?C)   Ht 5' 4"  (1.626 m)   Wt 240 lb (108.9 kg)   SpO2 (!) 88%   BMI 41.20 kg/m?  ? ? ?Physical Exam  ?General:  Well Developed, well nourished, appropriate for stated age.  ?Neuro:  Alert and oriented,  extra-ocular muscles intact  ?HEENT:  Normocephalic, atraumatic, no sinus pressure, cerumen impaction of both ears, neck supple, no  adenopathy   ?Skin:  no gross rash, warm, pink. ?Cardiac:  RRR, S1 S2 ?Respiratory: Scattered rhonchi with wheezing, no crackles or rales.  ?Vascular:  Ext warm, no cyanosis apprec.; 2+ pitting edema ?Psych:  No HI/SI, judgement and insight good, Euthymic mood. Full Affect. ? ? ?No results found for any visits on 01/18/22. ? Assessment & Plan  ?  ? ?Patient has s/sx suspicious for bacterial lower respiratory infection so will start antibiotic therapy with doxycycline 100 mg BID x 7 days. Recommend to use albuterol inhaler as needed for shortness of breath and wheezing. Advised to monitor for worsening symptoms which should seek immediate medical care. If symptoms fail to improve with antibiotic therapy recommend further evaluation with chest x-ray and labs (CBC). Will send rx for fluconazole and advised can take a second dose in 72 hrs if needed. On exam has pitting edema, recommend to take an extra dose of Lasix and continue with elevation. If continues to have significant edema then recommend changing Lasix 20 mg BID to torsemide. Will reassess edema at regular chronic f/up visit.  ? ? ?Return if symptoms worsen or fail to improve.  ?   ? ? ? ?Lorrene Reid, PA-C  ?[facility] ?[phone] (phone) ?[phone] (fax) ? ?[facility] ?

## 2022-01-18 NOTE — Patient Instructions (Signed)
Community-Acquired Pneumonia, Adult ?Pneumonia is an infection of the lungs. It causes irritation and swelling in the airways of the lungs. Mucus and fluid may also build up inside the airways. This may cause coughing and trouble breathing. ?One type of pneumonia can happen while you are in a hospital. A different type can happen when you are not in a hospital (community-acquired pneumonia). ?What are the causes? ? ?This condition is caused by germs (viruses, bacteria, or fungi). Some types of germs can spread from person to person. Pneumonia is not thought to spread from person to person. ?What increases the risk? ?You are more likely to develop this condition if: ?You have a long-term (chronic) disease, such as: ?Disease of the lungs. This may be chronic obstructive pulmonary disease (COPD) or asthma. ?Heart failure. ?Cystic fibrosis. ?Diabetes. ?Kidney disease. ?Sickle cell disease. ?HIV. ?You have other health problems, such as: ?Your body's defense system (immune system) is weak. ?A condition that may cause you to breathe in fluids from your mouth and nose. ?You had your spleen taken out. ?You do not take good care of your teeth and mouth (poor dental hygiene). ?You use or have used tobacco products. ?You travel where the germs that cause this illness are common. ?You are near certain animals or the places they live. ?You are older than 78 years of age. ?What are the signs or symptoms? ?Symptoms of this condition include: ?A cough. ?A fever. ?Sweating or chills. ?Chest pain, often when you breathe deeply or cough. ?Breathing problems, such as: ?Fast breathing. ?Trouble breathing. ?Shortness of breath. ?Feeling tired (fatigued). ?Muscle aches. ?How is this treated? ?Treatment for this condition depends on many things, such as: ?The cause of your illness. ?Your medicines. ?Your other health problems. ?Most adults can be treated at home. Sometimes, treatment must happen in a hospital. ?Treatment may include  medicines to kill germs. ?Medicines may depend on which germ caused your illness. ?Very bad pneumonia is rare. If you get it, you may: ?Have a machine to help you breathe. ?Have fluid taken away from around your lungs. ?Follow these instructions at home: ?Medicines ?Take over-the-counter and prescription medicines only as told by your doctor. ?Take cough medicine only if you are losing sleep. Cough medicine can keep your body from taking mucus away from your lungs. ?If you were prescribed an antibiotic medicine, take it as told by your doctor. Do not stop taking the antibiotic even if you start to feel better. ?Lifestyle ? ?  ? ?Do not drink alcohol. ?Do not use any products that contain nicotine or tobacco, such as cigarettes, e-cigarettes, and chewing tobacco. If you need help quitting, ask your doctor. ?Eat a healthy diet. This includes a lot of vegetables, fruits, whole grains, low-fat dairy products, and low-fat (lean) protein. ?General instructions ? ?Rest a lot. Sleep for at least 8 hours each night. ?Sleep with your head and neck raised. Put a few pillows under your head or sleep in a reclining chair. ?Return to your normal activities as told by your doctor. Ask your doctor what activities are safe for you. ?Drink enough fluid to keep your pee (urine) pale yellow. ?If your throat is sore, rinse your mouth often with salt water. To make salt water, dissolve ?-1 tsp (3-6 g) of salt in 1 cup (237 mL) of warm water. ?Keep all follow-up visits as told by your doctor. This is important. ?How is this prevented? ?You can lower your risk of pneumonia by: ?Getting the pneumonia shot (vaccine). These  shots have different types and schedules. Ask your doctor what works best for you. Think about getting this shot if: ?You are older than 78 years of age. ?You are 41-38 years of age and: ?You are being treated for cancer. ?You have long-term lung disease. ?You have other problems that affect your body's defense system. Ask  your doctor if you have one of these. ?Getting your flu shot every year. Ask your doctor which type of shot is best for you. ?Going to the dentist as often as told. ?Washing your hands often with soap and water for at least 20 seconds. If you cannot use soap and water, use hand sanitizer. ?Contact a doctor if: ?You have a fever. ?You lose sleep because your cough medicine does not help. ?Get help right away if: ?You are short of breath and this gets worse. ?You have more chest pain. ?Your sickness gets worse. This is very serious if: ?You are an older adult. ?Your body's defense system is weak. ?You cough up blood. ?These symptoms may be an emergency. Do not wait to see if the symptoms will go away. Get medical help right away. Call your local emergency services (911 in the [place] Do not drive yourself to the hospital. ?Summary ?Pneumonia is an infection of the lungs. ?Community-acquired pneumonia affects people who have not been in the hospital. Certain germs can cause this infection. ?This condition may be treated with medicines that kill germs. ?For very bad pneumonia, you may need a hospital stay and treatment to help with breathing. ?This information is not intended to replace advice given to you by your health care provider. Make sure you discuss any questions you have with your health care provider. ?Document Revised: 06/03/2019 Document Reviewed: 06/03/2019 ?Elsevier Patient Education ? [place]. ? ?

## 2022-02-01 ENCOUNTER — Other Ambulatory Visit: Payer: Self-pay | Admitting: Neurology

## 2022-03-02 DIAGNOSIS — Z79891 Long term (current) use of opiate analgesic: Secondary | ICD-10-CM | POA: Diagnosis not present

## 2022-03-02 DIAGNOSIS — M545 Low back pain, unspecified: Secondary | ICD-10-CM | POA: Diagnosis not present

## 2022-03-02 DIAGNOSIS — G894 Chronic pain syndrome: Secondary | ICD-10-CM | POA: Diagnosis not present

## 2022-03-02 DIAGNOSIS — Z5181 Encounter for therapeutic drug level monitoring: Secondary | ICD-10-CM | POA: Diagnosis not present

## 2022-03-31 ENCOUNTER — Ambulatory Visit (INDEPENDENT_AMBULATORY_CARE_PROVIDER_SITE_OTHER): Payer: Medicare Other | Admitting: Physician Assistant

## 2022-03-31 ENCOUNTER — Encounter: Payer: Self-pay | Admitting: Physician Assistant

## 2022-03-31 VITALS — BP 114/76 | HR 81 | Ht 64.0 in | Wt 240.0 lb

## 2022-03-31 DIAGNOSIS — R600 Localized edema: Secondary | ICD-10-CM | POA: Diagnosis not present

## 2022-03-31 DIAGNOSIS — Z0000 Encounter for general adult medical examination without abnormal findings: Secondary | ICD-10-CM

## 2022-03-31 DIAGNOSIS — R7303 Prediabetes: Secondary | ICD-10-CM | POA: Diagnosis not present

## 2022-03-31 DIAGNOSIS — E039 Hypothyroidism, unspecified: Secondary | ICD-10-CM

## 2022-03-31 DIAGNOSIS — Z111 Encounter for screening for respiratory tuberculosis: Secondary | ICD-10-CM

## 2022-03-31 MED ORDER — FUROSEMIDE 20 MG PO TABS: 20.0000 mg | ORAL_TABLET | Freq: Two times a day (BID) | ORAL | 1 refills | Status: DC

## 2022-03-31 NOTE — Patient Instructions (Signed)
Peripheral Edema  Peripheral edema is swelling that is caused by a buildup of fluid. Peripheral edema most often affects the lower legs, ankles, and feet. It can also develop in the arms, hands, and face. The area of the body that has peripheral edema will look swollen. It may also feel heavy or warm. Your clothes may start to feel tight. Pressing on the area may make a temporary dent in your skin (pitting edema). You may not be able to move your swollen arm or leg as much as usual. There are many causes of peripheral edema. It can happen because of a complication of other conditions such as heart failure, kidney disease, or a problem with your circulation. It also can be a side effect of certain medicines or happen because of an infection. It often happens to women during pregnancy. Sometimes, the cause is not known. Follow these instructions at home: Managing pain, stiffness, and swelling  Raise (elevate) your legs while you are sitting or lying down. Move around often to prevent stiffness and to reduce swelling. Do not sit or stand for long periods of time. Do not wear tight clothing. Do not wear garters on your upper legs. Exercise your legs to get your circulation going. This helps to move the fluid back into your blood vessels, and it may help the swelling go down. Wear compression stockings as told by your health care provider. These stockings help to prevent blood clots and reduce swelling in your legs. It is important that these are the correct size. These stockings should be prescribed by your doctor to prevent possible injuries. If elastic bandages or wraps are recommended, use them as told by your health care provider. Medicines Take over-the-counter and prescription medicines only as told by your health care provider. Your health care provider may prescribe medicine to help your body get rid of excess water (diuretic). Take this medicine if you are told to take it. General  instructions Eat a low-salt (low-sodium) diet as told by your health care provider. Sometimes, eating less salt may reduce swelling. Pay attention to any changes in your symptoms. Moisturize your skin daily to help prevent skin from cracking and draining. Keep all follow-up visits. This is important. Contact a health care provider if: You have a fever. You have swelling in only one leg. You have increased swelling, redness, or pain in one or both of your legs. You have drainage or sores at the area where you have edema. Get help right away if: You have edema that starts suddenly or is getting worse, especially if you are pregnant or have a medical condition. You develop shortness of breath, especially when you are lying down. You have pain in your chest or abdomen. You feel weak. You feel like you will faint. These symptoms may be an emergency. Get help right away. Call 911. Do not wait to see if the symptoms will go away. Do not drive yourself to the hospital. Summary Peripheral edema is swelling that is caused by a buildup of fluid. Peripheral edema most often affects the lower legs, ankles, and feet. Move around often to prevent stiffness and to reduce swelling. Do not sit or stand for long periods of time. Pay attention to any changes in your symptoms. Contact a health care provider if you have edema that starts suddenly or is getting worse, especially if you are pregnant or have a medical condition. Get help right away if you develop shortness of breath, especially when lying down.   This information is not intended to replace advice given to you by your health care provider. Make sure you discuss any questions you have with your health care provider. Document Revised: 04/25/2021 Document Reviewed: 04/25/2021 Elsevier Patient Education  2023 [employer]  

## 2022-03-31 NOTE — Progress Notes (Signed)
Subjective:   [name] is a 76 y.o. female who presents for Medicare Annual (Subsequent) preventive examination.  Review of Systems    Review of Systems: General:   No F/C, wt loss Pulm:   No DIB, SOB, pleuritic chest pain Card:  No CP, palpitations Abd:  No n/v/d or pain Ext:  +edema        Objective:    Today's Vitals   03/31/22 1017  BP: 114/76  Pulse: 81  Weight: 240 lb (108.9 kg)  Height: '5\' 4"'$  (1.626 m)   Body mass index is 41.2 kg/m.     03/15/2021   10:19 PM 10/15/2020   10:27 AM 09/09/2020    6:43 PM 04/30/2020    2:00 PM 09/12/2016    2:51 PM 08/22/2016    2:29 PM 10/12/2015   11:25 AM  Advanced Directives  Does Patient Have a Medical Advance Directive? Yes Yes Yes Yes No No No  Type of Advance Directive Living will [place];Living will Healthcare Power of [place]     Does patient want to make changes to medical advance directive? No - Patient declined  No - Patient declined    Yes - information given  Copy of [place] in Chart?    No - copy requested     Would patient like information on creating a medical advance directive? No - Patient declined  No - Patient declined  Yes (MAU/Ambulatory/Procedural Areas - Information given)      Current Medications (verified) Outpatient Encounter Medications as of 03/31/2022  Medication Sig   albuterol (VENTOLIN HFA) 108 (90 Base) MCG/ACT inhaler Inhale 2 puffs into the lungs every 6 (six) hours as needed for wheezing or shortness of breath.   aspirin 325 MG tablet Take 325 mg by mouth daily.   calamine lotion Apply 1 application topically as needed for itching.   clotrimazole-betamethasone (LOTRISONE) cream Apply 1 application. topically daily.   Erenumab-aooe (AIMOVIG) 70 MG/ML SOAJ Inject 70 mg into the skin every 30 (thirty) days.   fluconazole (DIFLUCAN) 150 MG tablet Take 1 tablet (150 mg total) by mouth daily. May repeat a second dose in 72 hrs  if needed.   furosemide (LASIX) 20 MG tablet Take 1 tablet (20 mg total) by mouth 2 (two) times daily.   levothyroxine (SYNTHROID) 100 MCG tablet TAKE 1 TABLET BY MOUTH DAILY   oxyCODONE (ROXICODONE) 15 MG immediate release tablet Take 1 tablet by mouth. May take up to 5 times daily   pregabalin (LYRICA) 100 MG capsule Take 1 capsule (100 mg total) by mouth 2 (two) times daily.   ramelteon (ROZEREM) 8 MG tablet Take 1 tablet by mouth daily.   tiZANidine (ZANAFLEX) 4 MG tablet Take 1 tablet (4 mg total) by mouth every 6 (six) hours as needed for muscle spasms.   traZODone (DESYREL) 50 MG tablet Take 50-150 mg at bedtime as needed by mouth for sleep (and may take one additional 50 mg tablet daily AS NEEDED for part of a "headache cocktail").    Ubrogepant (UBRELVY) 50 MG TABS Take 50 mg by mouth as needed (may repeat dose in 2 hours, not to exceed 100 mg/day, do not take more than 8 days a month).   venlafaxine (EFFEXOR) 37.5 MG tablet Take 1 tablet (37.5 mg total) by mouth 2 (two) times daily.   vortioxetine HBr (TRINTELLIX) 20 MG TABS tablet Take 20 mg by mouth daily.   [DISCONTINUED] furosemide (  LASIX) 20 MG tablet TAKE 1 TABLET BY MOUTH TWICE DAILY   No facility-administered encounter medications on file as of 03/31/2022.    Allergies (verified) Bupropion hcl, Dairy aid [tilactase], Other, Penicillins, Azithromycin, and Erythromycin   History: Past Medical History:  Diagnosis Date   Anemia    took Fe- orally 2 yrs. ago    Anxiety    panic attacks    Arthritis    shoulders, knees   Cancer (HCC)    skin- preCA   Depression    GERD (gastroesophageal reflux disease)    uses Protonix as needed    Headache(784.0)    Humerus fracture    Right    Hyposmolality and/or hyponatremia 02/27/2013   Hypothyroidism    IBS (irritable bowel syndrome)    Memory loss    Mental disorder    Migraine    Preoperative examination 02/26/2013   Seizures ([place])    caused by depression , seritonin  seizure - effected short term memory     Shortness of breath    Past Surgical History:  Procedure Laterality Date   ABDOMINAL HYSTERECTOMY     APPENDECTOMY     BACK SURGERY  1990's   BREAST SURGERY  1972   augmentation    CHOLECYSTECTOMY     GASTRECTOMY  1994   Due to ulcer, required multiple revisions.    HERNIA REPAIR     ORIF ANKLE FRACTURE Right 2004   REPLACEMENT TOTAL KNEE     REVERSE SHOULDER ARTHROPLASTY Right 05/16/2013   Procedure: RIGHT HEMI ARTHROPLASTY  VERSES  REVERSE TOTAL SHOULDER ARTHROPLASTY ;  Surgeon: Augustin Schooling, MD;  Location: [place];  Service: Orthopedics;  Laterality: Right;   SPINE SURGERY  2005   Lumbar spinal stenosis    STOMACH SURGERY     1/3 resection for obstruction   TONSILLECTOMY     TUBAL LIGATION     Family History  Problem Relation Age of Onset   Aneurysm Mother    Heart disease Mother    Cancer Father        pancreatic   Stroke Father 28   Hypertension Father    Alcohol abuse Father    Heart disease Maternal Grandfather    Social History   Socioeconomic History   Marital status: Married    Spouse name: Not on file   Number of children: 1   Years of education: College   Highest education level: Not on file  Occupational History   Occupation: Retired  Tobacco Use   Smoking status: Never   Smokeless tobacco: Never  Vaping Use   Vaping Use: Never used  Substance and Sexual Activity   Alcohol use: Yes    Comment: rare use of white wine    Drug use: No   Sexual activity: Not on file  Other Topics Concern   Not on file  Social History Narrative   Lives at home with her husband.   2 cups caffeine daily.   Right-handed.   Social Determinants of Health   Financial Resource Strain: Not on file  Food Insecurity: Not on file  Transportation Needs: Not on file  Physical Activity: Not on file  Stress: Not on file  Social Connections: Not on file    Tobacco Counseling Counseling given: Not  Answered     Diabetic?no         Activities of Daily Living    03/31/2022   10:20 AM 01/18/2022    3:50 PM  In  your present state of health, do you have any difficulty performing the following activities:  Hearing? 0 1  Vision? 0 1  Difficulty concentrating or making decisions? 1 1  Walking or climbing stairs? 1 1  Dressing or bathing? 1 1  Doing errands, shopping? 1 1    Patient Care Team: Lorrene Reid, PA-C as PCP - General (Physician Assistant) Ileene Patrick, LCSW as Social Worker (Licensed Clinical Social Worker) Chucky May, MD as Consulting Physician (Psychiatry) Donzetta Sprung., MD as Physician Assistant (Sports Medicine) Margaretmary Lombard, PhD as Referring Physician (Psychology) Juanita Craver, MD as Consulting Physician (Gastroenterology) Cameron Sprang, MD as Consulting Physician (Neurology)  Indicate any recent [place] you may have received from other than [facility] providers in the past year (date may be approximate).     Assessment:   This is a routine wellness examination for [place].  Hearing/Vision screen No results found.  Dietary issues and exercise activities discussed:  -Discussed low sodium and heart healthy diet.    Goals Addressed   None   Depression Screen    03/31/2022   10:19 AM 01/18/2022    3:50 PM 11/25/2021   10:22 AM 10/18/2021   10:16 AM 10/04/2021    2:37 PM 07/22/2021    9:56 AM 12/27/2020    9:39 AM  PHQ 2/9 Scores  PHQ - 2 Score 1 2 0 2 2 0 3  PHQ- 9 Score '11 5 10 6 5 '$ 0 13    Fall Risk    01/18/2022    3:50 PM 11/25/2021   10:22 AM 10/18/2021   10:16 AM 10/04/2021    2:36 PM 07/22/2021    9:56 AM  Fall Risk   Falls in the past year? 0 0 0 0 0  Number falls in past yr: 0 0 0 0 0  Injury with Fall? 0 0 0 0 0  Risk for fall due to : Impaired balance/gait;Impaired mobility;History of fall(s) No Fall Risks  No Fall Risks Impaired balance/gait;Impaired mobility;History of fall(s)  Follow up Falls  evaluation completed Falls evaluation completed Falls evaluation completed Falls evaluation completed Falls evaluation completed    Jay:  Any stairs in or around the home? Yes  If so, are there any without handrails? No  Home free of loose throw rugs in walkways, pet beds, electrical cords, etc? Yes  Adequate lighting in your home to reduce risk of falls? Yes   ASSISTIVE DEVICES UTILIZED TO PREVENT FALLS:  Life alert? Yes  Use of a cane, walker or w/c? Yes  Grab bars in the bathroom? Yes  Shower chair or bench in shower? Yes  Elevated toilet seat or a handicapped toilet? No   TIMED UP AND GO:  Was the test performed? Yes .  Length of time to ambulate 10 feet: 12 sec.   Gait slow and steady with assistive device  Cognitive Function:    01/12/2021    1:00 PM 05/07/2018    1:21 PM 06/11/2017    4:53 PM 11/06/2016    3:49 PM  MMSE - Mini Mental State Exam  Orientation to time '1 1 1 3  '$ Orientation to Place '2 4 4 4  '$ Registration '3 3 3 3  '$ Attention/ Calculation '1 2 3 2  '$ Recall '2 2 2 2  '$ Language- name 2 objects '2 2 2 2  '$ Language- repeat '1 1 1 1  '$ Language- follow 3 step command '3 3 3 3  '$ Language- read &  follow direction '1 1 1 1  '$ Write a sentence '1 1 1 1  '$ Copy design '1 1 1 '$ 0  Total score '18 21 22 22      '$ 12/06/2021   11:23 AM 10/15/2020   10:00 AM  Montreal Cognitive Assessment   Visuospatial/ Executive (0/5) 4 3  Naming (0/3) 3 3  Attention: Read list of digits (0/2) 2 2  Attention: Read list of letters (0/1) 1 1  Attention: Serial 7 subtraction starting at 100 (0/3) 0 0  Language: Repeat phrase (0/2) 2 2  Language : Fluency (0/1) 0 0  Abstraction (0/2) 2 2  Delayed Recall (0/5) 0 0  Orientation (0/6) 2 3  Total 16 16  Adjusted Score (based on education)  16      03/31/2022   10:20 AM 03/04/2021    9:35 AM 12/25/2018    3:39 PM  6CIT Screen  What Year? 0 points 0 points 4 points  What month? 0 points 0 points 3 points  What  time? 3 points 3 points 3 points  Count back from 20 2 points 2 points 0 points  Months in reverse 4 points 0 points 4 points  Repeat phrase 2 points 4 points 8 points  Total Score 11 points 9 points 22 points    Immunizations Immunization History  Administered Date(s) Administered   Fluad Quad(high Dose 65+) 07/18/2019, 07/22/2021   Influenza Whole 06/10/2007, 06/03/2009, 07/15/2010   Influenza, High Dose Seasonal PF 11/12/2017   Influenza,inj,Quad PF,6+ Mos 09/17/2014, 07/02/2015, 08/22/2016   PFIZER Comirnaty(Gray Top)Covid-19 Tri-Sucrose Vaccine 12/28/2020   Pneumococcal Conjugate-13 07/02/2015   Pneumococcal Polysaccharide-23 08/11/2010   Td 02/03/2003    TDAP status: Due, Education has been provided regarding the importance of this vaccine. Advised may receive this vaccine at local pharmacy or Health Dept. Aware to provide a copy of the vaccination record if obtained from local pharmacy or Health Dept. Verbalized acceptance and understanding.  Flu Vaccine status: Due, Education has been provided regarding the importance of this vaccine. Advised may receive this vaccine at local pharmacy or Health Dept. Aware to provide a copy of the vaccination record if obtained from local pharmacy or Health Dept. Verbalized acceptance and understanding.  Pneumococcal vaccine status: Due, Education has been provided regarding the importance of this vaccine. Advised may receive this vaccine at local pharmacy or Health Dept. Aware to provide a copy of the vaccination record if obtained from local pharmacy or Health Dept. Verbalized acceptance and understanding.  Covid-19 vaccine status: Completed vaccines  Qualifies for Shingles Vaccine? No   Zostavax completed No   Shingrix Completed?: No.    Education has been provided regarding the importance of this vaccine. Patient has been advised to call insurance company to determine out of pocket expense if they have not yet received this vaccine. Advised  may also receive vaccine at local pharmacy or Health Dept. Verbalized acceptance and understanding.  Screening Tests Health Maintenance  Topic Date Due   Zoster Vaccines- Shingrix (1 of 2) Never done   TETANUS/TDAP  02/02/2013   COVID-19 Vaccine (2 - Pfizer risk series) 01/18/2021   INFLUENZA VACCINE  04/04/2022   Pneumonia Vaccine 24+ Years old  Completed   DEXA SCAN  Completed   Hepatitis C Screening  Completed   HPV VACCINES  Aged Out   COLONOSCOPY (Pts 45-84yr Insurance coverage will need to be confirmed)  Discontinued    Health Maintenance  Health Maintenance Due  Topic Date Due   Zoster Vaccines-  Shingrix (1 of 2) Never done   TETANUS/TDAP  02/02/2013   COVID-19 Vaccine (2 - Pfizer risk series) 01/18/2021    Colorectal cancer screening: Type of screening: Colonoscopy. Completed 2020. Repeat every n/a years  Mammogram status: No longer required due to n/a.  Bone Density status: Ordered deferred. Pt provided with contact info and advised to call to schedule appt.  Lung Cancer Screening: (Low Dose CT Chest recommended if Age 61-80 years, 30 pack-year currently smoking OR have quit w/in 15years.) does not qualify.   Lung Cancer Screening Referral: n/a  Additional Screening:  Hepatitis C Screening: does not qualify; Completed n/a, deferred   Vision Screening: Recommended annual ophthalmology exams for early detection of glaucoma and other disorders of the eye. Is the patient up to date with their annual eye exam?  No  Who is the provider or what is the name of the office in which the patient attends annual eye exams? N/a If pt is not established with a provider, would they like to be referred to a provider to establish care? Yes .   Dental Screening: Recommended annual dental exams for proper oral hygiene  Community Resource Referral / Chronic Care Management: CRR required this visit?  No   CCM required this visit?  No      Plan:  -Will obtain routine  non-fasting labs. -Recommend to take Lasix 25 mg BID for edema. Will collect CMP for baseline and repeat at f/up visit. Discussed elevation. -Completed FL2 form. -Follow-up in 4 months for thyroid, edema, mood   I have personally reviewed and noted the following in the patient's chart:   Medical and social history Use of alcohol, tobacco or illicit drugs  Current medications and supplements including opioid prescriptions.  Functional ability and status Nutritional status Physical activity Advanced directives List of other physicians Hospitalizations, surgeries, and ER visits in previous 12 months Vitals Screenings to include cognitive, depression, and falls Referrals and appointments  In addition, I have reviewed and discussed with patient certain preventive protocols, quality metrics, and best practice recommendations. A written personalized care plan for preventive services as well as general preventive health recommendations were provided to patient.       03/31/2022

## 2022-04-03 ENCOUNTER — Encounter: Payer: Self-pay | Admitting: Physician Assistant

## 2022-04-04 LAB — COMPREHENSIVE METABOLIC PANEL
ALT: 9 IU/L (ref 0–32)
AST: 20 IU/L (ref 0–40)
Albumin/Globulin Ratio: 1.6 (ref 1.2–2.2)
Albumin: 4 g/dL (ref 3.8–4.8)
Alkaline Phosphatase: 100 IU/L (ref 44–121)
BUN/Creatinine Ratio: 9 — ABNORMAL LOW (ref 12–28)
BUN: 9 mg/dL (ref 8–27)
Bilirubin Total: 0.4 mg/dL (ref 0.0–1.2)
CO2: 24 mmol/L (ref 20–29)
Calcium: 9 mg/dL (ref 8.7–10.3)
Chloride: 96 mmol/L (ref 96–106)
Creatinine, Ser: 0.99 mg/dL (ref 0.57–1.00)
Globulin, Total: 2.5 g/dL (ref 1.5–4.5)
Glucose: 139 mg/dL — ABNORMAL HIGH (ref 70–99)
Potassium: 4 mmol/L (ref 3.5–5.2)
Sodium: 135 mmol/L (ref 134–144)
Total Protein: 6.5 g/dL (ref 6.0–8.5)
eGFR: 58 mL/min/{1.73_m2} — ABNORMAL LOW (ref >59)

## 2022-04-04 LAB — CBC WITH DIFFERENTIAL/PLATELET
Basophils Absolute: 0 10*3/uL (ref 0.0–0.2)
Basos: 1 %
EOS (ABSOLUTE): 0.2 10*3/uL (ref 0.0–0.4)
Eos: 4 %
Hematocrit: 38.4 % (ref 34.0–46.6)
Hemoglobin: 12 g/dL (ref 11.1–15.9)
Immature Grans (Abs): 0 10*3/uL (ref 0.0–0.1)
Immature Granulocytes: 0 %
Lymphocytes Absolute: 1.2 10*3/uL (ref 0.7–3.1)
Lymphs: 21 %
MCH: 25.8 pg — ABNORMAL LOW (ref 26.6–33.0)
MCHC: 31.3 g/dL — ABNORMAL LOW (ref 31.5–35.7)
MCV: 82 fL (ref 79–97)
Monocytes Absolute: 0.6 10*3/uL (ref 0.1–0.9)
Monocytes: 11 %
Neutrophils Absolute: 3.5 10*3/uL (ref 1.4–7.0)
Neutrophils: 63 %
Platelets: 164 10*3/uL (ref 150–450)
RBC: 4.66 x10E6/uL (ref 3.77–5.28)
RDW: 15.2 % (ref 11.7–15.4)
WBC: 5.5 10*3/uL (ref 3.4–10.8)

## 2022-04-04 LAB — T4, FREE: Free T4: 1.49 ng/dL (ref 0.82–1.77)

## 2022-04-04 LAB — QUANTIFERON-TB GOLD PLUS
QuantiFERON Mitogen Value: >10 IU/mL
QuantiFERON Nil Value: 0.21 IU/mL
QuantiFERON TB1 Ag Value: 0.17 IU/mL
QuantiFERON TB2 Ag Value: 0.16 IU/mL
QuantiFERON-TB Gold Plus: NEGATIVE

## 2022-04-04 LAB — HEMOGLOBIN A1C
Est. average glucose Bld gHb Est-mCnc: 126 mg/dL
Hgb A1c MFr Bld: 6 % — ABNORMAL HIGH (ref 4.8–5.6)

## 2022-04-04 LAB — TSH: TSH: 1.95 u[IU]/mL (ref 0.450–4.500)

## 2022-05-04 ENCOUNTER — Telehealth: Payer: Self-pay | Admitting: Neurology

## 2022-05-04 NOTE — Telephone Encounter (Signed)
[facility] calling to clarify instructions for Ubrogepant (UBRELVY) 50 MG TABS. Would like a call from the nurse.

## 2022-05-04 NOTE — Telephone Encounter (Signed)
I spoke with [facility]. The receptionist took a message for [name] to clarify instructions for [name]. I repeated the directions as per [name], NP's order. Carla Drape will return a call with any further questions. The receptionist read back instructions to me. She verbalized understanding and expressed appreciation for the call.

## 2022-05-09 DIAGNOSIS — R2689 Other abnormalities of gait and mobility: Secondary | ICD-10-CM | POA: Diagnosis not present

## 2022-05-09 DIAGNOSIS — M25562 Pain in left knee: Secondary | ICD-10-CM | POA: Diagnosis not present

## 2022-05-09 DIAGNOSIS — R262 Difficulty in walking, not elsewhere classified: Secondary | ICD-10-CM | POA: Diagnosis not present

## 2022-05-10 DIAGNOSIS — E039 Hypothyroidism, unspecified: Secondary | ICD-10-CM | POA: Diagnosis not present

## 2022-05-10 DIAGNOSIS — G43709 Chronic migraine without aura, not intractable, without status migrainosus: Secondary | ICD-10-CM | POA: Diagnosis not present

## 2022-05-10 DIAGNOSIS — G894 Chronic pain syndrome: Secondary | ICD-10-CM | POA: Diagnosis not present

## 2022-05-10 DIAGNOSIS — G4700 Insomnia, unspecified: Secondary | ICD-10-CM | POA: Diagnosis not present

## 2022-05-11 DIAGNOSIS — M25562 Pain in left knee: Secondary | ICD-10-CM | POA: Diagnosis not present

## 2022-05-11 DIAGNOSIS — R2689 Other abnormalities of gait and mobility: Secondary | ICD-10-CM | POA: Diagnosis not present

## 2022-05-11 DIAGNOSIS — R262 Difficulty in walking, not elsewhere classified: Secondary | ICD-10-CM | POA: Diagnosis not present

## 2022-05-12 DIAGNOSIS — M6281 Muscle weakness (generalized): Secondary | ICD-10-CM | POA: Diagnosis not present

## 2022-05-15 DIAGNOSIS — R262 Difficulty in walking, not elsewhere classified: Secondary | ICD-10-CM | POA: Diagnosis not present

## 2022-05-15 DIAGNOSIS — R2689 Other abnormalities of gait and mobility: Secondary | ICD-10-CM | POA: Diagnosis not present

## 2022-05-15 DIAGNOSIS — M25562 Pain in left knee: Secondary | ICD-10-CM | POA: Diagnosis not present

## 2022-05-16 DIAGNOSIS — R2689 Other abnormalities of gait and mobility: Secondary | ICD-10-CM | POA: Diagnosis not present

## 2022-05-16 DIAGNOSIS — R262 Difficulty in walking, not elsewhere classified: Secondary | ICD-10-CM | POA: Diagnosis not present

## 2022-05-16 DIAGNOSIS — M25562 Pain in left knee: Secondary | ICD-10-CM | POA: Diagnosis not present

## 2022-05-17 DIAGNOSIS — M6281 Muscle weakness (generalized): Secondary | ICD-10-CM | POA: Diagnosis not present

## 2022-05-18 DIAGNOSIS — R262 Difficulty in walking, not elsewhere classified: Secondary | ICD-10-CM | POA: Diagnosis not present

## 2022-05-18 DIAGNOSIS — M25562 Pain in left knee: Secondary | ICD-10-CM | POA: Diagnosis not present

## 2022-05-18 DIAGNOSIS — R2689 Other abnormalities of gait and mobility: Secondary | ICD-10-CM | POA: Diagnosis not present

## 2022-05-21 ENCOUNTER — Emergency Department: Payer: Medicare Other

## 2022-05-21 ENCOUNTER — Emergency Department
Admission: EM | Admit: 2022-05-21 | Discharge: 2022-05-21 | Disposition: A | Discharge status: Skilled Nursing Facility | Payer: Medicare Other | Attending: Emergency Medicine | Admitting: Emergency Medicine

## 2022-05-21 ENCOUNTER — Other Ambulatory Visit: Payer: Self-pay

## 2022-05-21 DIAGNOSIS — I10 Essential (primary) hypertension: Secondary | ICD-10-CM | POA: Diagnosis not present

## 2022-05-21 DIAGNOSIS — I5030 Unspecified diastolic (congestive) heart failure: Secondary | ICD-10-CM | POA: Diagnosis not present

## 2022-05-21 DIAGNOSIS — E876 Hypokalemia: Secondary | ICD-10-CM | POA: Diagnosis not present

## 2022-05-21 DIAGNOSIS — F0390 Unspecified dementia without behavioral disturbance: Secondary | ICD-10-CM | POA: Insufficient documentation

## 2022-05-21 DIAGNOSIS — R079 Chest pain, unspecified: Secondary | ICD-10-CM | POA: Insufficient documentation

## 2022-05-21 DIAGNOSIS — E039 Hypothyroidism, unspecified: Secondary | ICD-10-CM | POA: Insufficient documentation

## 2022-05-21 DIAGNOSIS — R0789 Other chest pain: Secondary | ICD-10-CM | POA: Diagnosis not present

## 2022-05-21 DIAGNOSIS — Z85828 Personal history of other malignant neoplasm of skin: Secondary | ICD-10-CM | POA: Diagnosis not present

## 2022-05-21 DIAGNOSIS — Z96611 Presence of right artificial shoulder joint: Secondary | ICD-10-CM | POA: Diagnosis not present

## 2022-05-21 DIAGNOSIS — Z743 Need for continuous supervision: Secondary | ICD-10-CM | POA: Diagnosis not present

## 2022-05-21 LAB — CBC WITH DIFFERENTIAL/PLATELET
Abs Immature Granulocytes: 0.01 10*3/uL (ref 0.00–0.07)
Basophils Absolute: 0.1 10*3/uL (ref 0.0–0.1)
Basophils Relative: 1 %
Eosinophils Absolute: 0.3 10*3/uL (ref 0.0–0.5)
Eosinophils Relative: 4 %
HCT: 36 % (ref 36.0–46.0)
Hemoglobin: 11.1 g/dL — ABNORMAL LOW (ref 12.0–15.0)
Immature Granulocytes: 0 %
Lymphocytes Relative: 32 %
Lymphs Abs: 2.3 10*3/uL (ref 0.7–4.0)
MCH: 24.8 pg — ABNORMAL LOW (ref 26.0–34.0)
MCHC: 30.8 g/dL (ref 30.0–36.0)
MCV: 80.5 fL (ref 80.0–100.0)
Monocytes Absolute: 0.8 10*3/uL (ref 0.1–1.0)
Monocytes Relative: 10 %
Neutro Abs: 4 10*3/uL (ref 1.7–7.7)
Neutrophils Relative %: 53 %
Platelets: 168 10*3/uL (ref 150–400)
RBC: 4.47 MIL/uL (ref 3.87–5.11)
RDW: 16.6 % — ABNORMAL HIGH (ref 11.5–15.5)
WBC: 7.4 10*3/uL (ref 4.0–10.5)
nRBC: 0 % (ref 0.0–0.2)

## 2022-05-21 LAB — COMPREHENSIVE METABOLIC PANEL
ALT: 13 U/L (ref 0–44)
AST: 18 U/L (ref 15–41)
Albumin: 3.2 g/dL — ABNORMAL LOW (ref 3.5–5.0)
Alkaline Phosphatase: 87 U/L (ref 38–126)
Anion gap: 9 (ref 5–15)
BUN: 10 mg/dL (ref 8–23)
CO2: 28 mmol/L (ref 22–32)
Calcium: 8.3 mg/dL — ABNORMAL LOW (ref 8.9–10.3)
Chloride: 103 mmol/L (ref 98–111)
Creatinine, Ser: 0.87 mg/dL (ref 0.44–1.00)
GFR, Estimated: >60 mL/min (ref >60)
Glucose, Bld: 97 mg/dL (ref 70–99)
Potassium: 2.8 mmol/L — ABNORMAL LOW (ref 3.5–5.1)
Sodium: 140 mmol/L (ref 135–145)
Total Bilirubin: 0.7 mg/dL (ref 0.3–1.2)
Total Protein: 6.2 g/dL — ABNORMAL LOW (ref 6.5–8.1)

## 2022-05-21 LAB — TROPONIN I (HIGH SENSITIVITY)
Troponin I (High Sensitivity): 7 ng/L (ref <18)
Troponin I (High Sensitivity): 7 ng/L (ref <18)

## 2022-05-21 MED ORDER — POTASSIUM CHLORIDE CRYS ER 20 MEQ PO TBCR
40.0000 meq | EXTENDED_RELEASE_TABLET | Freq: Once | ORAL | Status: AC
  Administered 2022-05-21: 40 meq via ORAL
  Filled 2022-05-21: qty 2

## 2022-05-21 MED ORDER — POTASSIUM CHLORIDE CRYS ER 20 MEQ PO TBCR: 20.0000 meq | EXTENDED_RELEASE_TABLET | Freq: Two times a day (BID) | ORAL | 0 refills | Status: DC

## 2022-05-21 NOTE — ED Notes (Signed)
Writer called [facility] and informed them patient was being discharge and we were setting up transport for her to come back to facility. Writer told them patient may need assistance getting a prescription filled and appointments for follow up arranged but that details were in discharge paperwork that was going with patient.

## 2022-05-21 NOTE — ED Provider Notes (Signed)
[facility] Provider Note    Event Date/Time   First MD Initiated Contact with Patient 05/21/22 0100     (approximate)   History   Chest Pain (Pt reports 2-3 episodes over past couple of weeks. Most recent today starting around 9pm, lasting about an hour. Pt reports no pain now, delay in coming to ED caused by not being able to get facility to respond until now. Pt denies any other symptoms. )   HPI  [name] is a 76 y.o. female past medical history of depression, GERD, IBS, hypothyroidism, anxiety who presents with chest pain.  Episode happened around 9 PM.  Patient was just sitting in recliner.  Felt like a pressure sensation that radiated to the right jaw and right shoulder.  She had some difficulty catching her breath no nausea or diaphoresis.  Last about 2 minutes and is since resolved.  She has had 3 total episodes similar to this.  The last one before this was about a week ago and similar.  Prior to that the other 2 episodes were about 6 months ago.  She has no history of cardiac disease.  Otherwise feels asymptomatic now.  Said she did have a viral type flulike illness last week but this is resolving.     Past Medical History:  Diagnosis Date   Anemia    took Fe- orally 2 yrs. ago    Anxiety    panic attacks    Arthritis    shoulders, knees   Cancer (HCC)    skin- preCA   Depression    GERD (gastroesophageal reflux disease)    uses Protonix as needed    Headache(784.0)    Humerus fracture    Right    Hyposmolality and/or hyponatremia 02/27/2013   Hypothyroidism    IBS (irritable bowel syndrome)    Memory loss    Mental disorder    Migraine    Preoperative examination 02/26/2013   Seizures ([place])    caused by depression , seritonin seizure - effected short term memory     Shortness of breath     Patient Active Problem List   Diagnosis Date Noted   Urinary tract infection with hematuria 10/24/2021   Dysuria 10/24/2021   Morbid  (severe) obesity due to excess calories ([place]) 10/04/2021   Chronic migraine w/o aura w/o status migrainosus, not intractable 05/19/2021   Dementia with behavioral disturbance ([place]) 73/22/0254   Folliculitis 27/02/2375   Rash 12/27/2020   Abnormal weight loss 10/22/2020   Morbid obesity ([place]) 10/22/2020   Nausea 10/22/2020   Long-term current use of opiate analgesic 12/01/2019   Encounter for Medicare annual wellness exam 12/25/2018   Chronic back pain 09/05/2018   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   History of gastrointestinal hemorrhage 09/05/2018   Insomnia 09/05/2018   Injury of left foot 09/05/2018   Chronic low back pain 05/07/2018   Cognitive impairment 05/07/2018   Age related osteoporosis 03/12/2018   On long term drug therapy 09/07/2017   Left-sided chest wall pain 07/25/2017   Hypotension 01/22/2017   Cerebrovascular accident (CVA) due to thrombosis of precerebral artery ([place]) 12/05/2016   Neck pain 12/05/2016   Gait abnormality 12/05/2016   Paronychia of finger of right hand 08/22/2016   Osteoarthritis of right knee 11/22/2015   Right knee pain 10/14/2015   Actinic keratoses 07/02/2015   Injury of right hand 04/27/2014   Chest wall tenderness 04/27/2014   Foot drop, left 04/14/2014   Traumatic injury  of lower extremity 03/25/2014   Obesity (BMI 30.0-34.9) 01/30/2014   Healthcare maintenance 01/30/2014   Status post right shoulder hemiarthroplasty 46/96/2952   Diastolic CHF ([place]) 84/13/2440   Fibromyalgia 08/14/2012   Numbness in feet 08/08/2012   GERD with stricture 03/04/2012   Leg edema 03/04/2012   CATARACT, LEFT EYE 08/23/2010   Abnormality of gait 08/23/2010   Osteoarthritis 02/24/2010   Iron deficiency anemia 03/06/2009   DEPENDENCE, OPIOID, 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/26/2006   SYMPTOM, INCONTINENCE W/O SENSORY AWARENESS 12/26/2006   Memory loss 12/26/2006   Hypothyroidism 11/01/2006   DEPRESSION, MAJOR, RECURRENT 11/01/2006   PANIC ATTACKS 11/01/2006   Migraine  headache 11/01/2006   Carpal tunnel syndrome 11/01/2006   TMJ SYNDROME 11/01/2006   Irritable bowel syndrome 11/01/2006   OSTEOPENIA 11/01/2006     Physical Exam  Triage Vital Signs: ED Triage Vitals  Enc Vitals Group     BP --      Pulse Rate 05/21/22 0112 (!) 58     Resp 05/21/22 0112 15     Temp --      Temp src --      SpO2 05/21/22 0112 98 %     Weight 05/21/22 0113 240 lb (108.9 kg)     Height 05/21/22 0113 '5\' 4"'$  (1.626 m)     Head Circumference --      Peak Flow --      Pain Score 05/21/22 0113 0     Pain Loc --      Pain Edu? --      Excl. in [place]? --     Most recent vital signs: Vitals:   05/21/22 0330 05/21/22 0400  BP: 112/69 108/69  Pulse: (!) 52 (!) 48  Resp: (!) 7 11  Temp:    SpO2: 93% 91%     General: Awake, no distress.  CV:  Good peripheral perfusion.  1+ edema bilateral lower extremities with chronic venous stasis changes Resp:  Normal effort.  No increased work of breathing lungs are clear Abd:  No distention.  Abdomen is obese, soft nontender Neuro:             Awake, Alert, Oriented x 3  Other:     ED Results / Procedures / Treatments  Labs (all labs ordered are listed, but only abnormal results are displayed) Labs Reviewed  COMPREHENSIVE METABOLIC PANEL - Abnormal; Notable for the following components:      Result Value   Potassium 2.8 (*)    Calcium 8.3 (*)    Total Protein 6.2 (*)    Albumin 3.2 (*)    All other components within normal limits  CBC WITH DIFFERENTIAL/PLATELET - Abnormal; Notable for the following components:   Hemoglobin 11.1 (*)    MCH 24.8 (*)    RDW 16.6 (*)    All other components within normal limits  TROPONIN I (HIGH SENSITIVITY)  TROPONIN I (HIGH SENSITIVITY)     EKG  EKG reviewed interpreted myself shows sinus pericardia with normal axis normal intervals inverted T wave in lead V2   RADIOLOGY I reviewed and interpreted the CXR which does not show any acute cardiopulmonary  process    PROCEDURES:  Critical Care performed: No  .1-3 Lead EKG Interpretation  Performed by: Rada Hay, MD Authorized by: Rada Hay, MD     Interpretation: normal     ECG rate assessment: normal     Rhythm: sinus rhythm     Ectopy: none     Conduction:  normal     The patient is on the cardiac monitor to evaluate for evidence of arrhythmia and/or significant heart rate changes.   MEDICATIONS ORDERED IN ED: Medications  potassium chloride SA (KLOR-CON M) CR tablet 40 mEq (40 mEq Oral Given 05/21/22 0236)     IMPRESSION / MDM / ASSESSMENT AND PLAN / ED COURSE  I reviewed the triage vital signs and the nursing notes.                              Patient's presentation is most consistent with acute presentation with potential threat to life or bodily function.  Differential diagnosis includes, but is not limited to, unstable angina, NSTEMI, GERD, musculoskeletal, less likely PE, aortic dissection  76 year old female who presents after an episode of chest pain.  It occurred several hours ago and lasted for about 2 minutes occurred while she was at rest.  Was pressure-like rating to the right shoulder and right jaw.  She had associated shortness of breath no nausea or diaphoresis.  She has had 3 similar episodes in the past last was 1 week ago and prior to that had 2 episodes 6 months ago.  No history of cardiac disease.  Her vitals are reassuring.  She looks well on exam cardiopulmonary exam is reassuring.  EKG showing inverted T wave in V2-similar to prior EKG and is otherwise nonischemic.  Reassured that this episode lasted for just 2 minutes and is now resolved.  Will check troponin to evaluate for myocardial ischemia and chest x-ray.  If enzymes are normal and patient continues to be asymptomatic I think she will be appropriate for outpatient follow-up.     Patient's first troponin is negative.  Labs otherwise notable for hypokalemia with potassium 2.8  which was supplemented.  Repeat EKG does have some PACs but otherwise no acute ischemic changes.    Repeat troponin is negative.  Patient continues to be asymptomatic.  Will discharge.  Patient given 5 days potassium supplement.  FINAL CLINICAL IMPRESSION(S) / ED DIAGNOSES   Final diagnoses:  Chest pain, unspecified type  Hypokalemia     Rx / DC Orders   ED Discharge Orders          Ordered    potassium chloride SA (KLOR-CON M) 20 MEQ tablet  2 times daily        05/21/22 0451             Note:  This document was prepared using Dragon voice recognition software and may include unintentional dictation errors.   Rada Hay, MD 05/21/22 [phone]

## 2022-05-21 NOTE — ED Notes (Signed)
Pt picked up by [facility] to be taken back to facility. Pt in no distress at time of departure.

## 2022-05-21 NOTE — ED Triage Notes (Signed)
Pt reports 2-3 episodes over past couple of weeks. Most recent today starting around 9pm, lasting about an hour. Pt reports no pain now, delay in coming to ED caused by not being able to get facility to respond until now. Pt denies any other symptoms.

## 2022-05-21 NOTE — ED Notes (Signed)
[facility] called for transport back to [facility]

## 2022-05-21 NOTE — Discharge Instructions (Addendum)
Your cardiac work-up including your cardiac enzymes and EKG were reassuring.  Please follow-up with your primary care doctor and cardiology.  If you have return of chest pain that is not quickly resolving please return to the emergency department.  We also found that your potassium was slightly low.  Please take the potassium supplement twice a day for the next 5 days.  Please then have a repeat basic metabolic panel done to recheck your potassium in about 1 week.

## 2022-05-22 DIAGNOSIS — M25562 Pain in left knee: Secondary | ICD-10-CM | POA: Diagnosis not present

## 2022-05-22 DIAGNOSIS — R262 Difficulty in walking, not elsewhere classified: Secondary | ICD-10-CM | POA: Diagnosis not present

## 2022-05-22 DIAGNOSIS — M6281 Muscle weakness (generalized): Secondary | ICD-10-CM | POA: Diagnosis not present

## 2022-05-22 DIAGNOSIS — R2689 Other abnormalities of gait and mobility: Secondary | ICD-10-CM | POA: Diagnosis not present

## 2022-05-25 DIAGNOSIS — R2689 Other abnormalities of gait and mobility: Secondary | ICD-10-CM | POA: Diagnosis not present

## 2022-05-25 DIAGNOSIS — M6281 Muscle weakness (generalized): Secondary | ICD-10-CM | POA: Diagnosis not present

## 2022-05-25 DIAGNOSIS — R262 Difficulty in walking, not elsewhere classified: Secondary | ICD-10-CM | POA: Diagnosis not present

## 2022-05-25 DIAGNOSIS — M25562 Pain in left knee: Secondary | ICD-10-CM | POA: Diagnosis not present

## 2022-05-26 DIAGNOSIS — M6281 Muscle weakness (generalized): Secondary | ICD-10-CM | POA: Diagnosis not present

## 2022-05-29 DIAGNOSIS — R2689 Other abnormalities of gait and mobility: Secondary | ICD-10-CM | POA: Diagnosis not present

## 2022-05-29 DIAGNOSIS — M25562 Pain in left knee: Secondary | ICD-10-CM | POA: Diagnosis not present

## 2022-05-29 DIAGNOSIS — R262 Difficulty in walking, not elsewhere classified: Secondary | ICD-10-CM | POA: Diagnosis not present

## 2022-05-31 DIAGNOSIS — M6281 Muscle weakness (generalized): Secondary | ICD-10-CM | POA: Diagnosis not present

## 2022-06-05 DIAGNOSIS — M6281 Muscle weakness (generalized): Secondary | ICD-10-CM | POA: Diagnosis not present

## 2022-06-06 DIAGNOSIS — R2689 Other abnormalities of gait and mobility: Secondary | ICD-10-CM | POA: Diagnosis not present

## 2022-06-06 DIAGNOSIS — M25562 Pain in left knee: Secondary | ICD-10-CM | POA: Diagnosis not present

## 2022-06-06 DIAGNOSIS — R262 Difficulty in walking, not elsewhere classified: Secondary | ICD-10-CM | POA: Diagnosis not present

## 2022-06-07 DIAGNOSIS — R2689 Other abnormalities of gait and mobility: Secondary | ICD-10-CM | POA: Diagnosis not present

## 2022-06-07 DIAGNOSIS — M6281 Muscle weakness (generalized): Secondary | ICD-10-CM | POA: Diagnosis not present

## 2022-06-07 DIAGNOSIS — R262 Difficulty in walking, not elsewhere classified: Secondary | ICD-10-CM | POA: Diagnosis not present

## 2022-06-07 DIAGNOSIS — M25562 Pain in left knee: Secondary | ICD-10-CM | POA: Diagnosis not present

## 2022-06-12 DIAGNOSIS — M6281 Muscle weakness (generalized): Secondary | ICD-10-CM | POA: Diagnosis not present

## 2022-06-13 DIAGNOSIS — M6281 Muscle weakness (generalized): Secondary | ICD-10-CM | POA: Diagnosis not present

## 2022-06-15 DIAGNOSIS — M25562 Pain in left knee: Secondary | ICD-10-CM | POA: Diagnosis not present

## 2022-06-15 DIAGNOSIS — R2689 Other abnormalities of gait and mobility: Secondary | ICD-10-CM | POA: Diagnosis not present

## 2022-06-15 DIAGNOSIS — R262 Difficulty in walking, not elsewhere classified: Secondary | ICD-10-CM | POA: Diagnosis not present

## 2022-06-19 DIAGNOSIS — R262 Difficulty in walking, not elsewhere classified: Secondary | ICD-10-CM | POA: Diagnosis not present

## 2022-06-19 DIAGNOSIS — M25562 Pain in left knee: Secondary | ICD-10-CM | POA: Diagnosis not present

## 2022-06-19 DIAGNOSIS — R2689 Other abnormalities of gait and mobility: Secondary | ICD-10-CM | POA: Diagnosis not present

## 2022-06-21 DIAGNOSIS — M6281 Muscle weakness (generalized): Secondary | ICD-10-CM | POA: Diagnosis not present

## 2022-06-22 DIAGNOSIS — M6281 Muscle weakness (generalized): Secondary | ICD-10-CM | POA: Diagnosis not present

## 2022-06-26 DIAGNOSIS — M6281 Muscle weakness (generalized): Secondary | ICD-10-CM | POA: Diagnosis not present

## 2022-06-27 DIAGNOSIS — M25562 Pain in left knee: Secondary | ICD-10-CM | POA: Diagnosis not present

## 2022-06-27 DIAGNOSIS — R262 Difficulty in walking, not elsewhere classified: Secondary | ICD-10-CM | POA: Diagnosis not present

## 2022-06-27 DIAGNOSIS — R2689 Other abnormalities of gait and mobility: Secondary | ICD-10-CM | POA: Diagnosis not present

## 2022-06-28 DIAGNOSIS — G301 Alzheimer's disease with late onset: Secondary | ICD-10-CM | POA: Diagnosis not present

## 2022-06-28 DIAGNOSIS — F332 Major depressive disorder, recurrent severe without psychotic features: Secondary | ICD-10-CM | POA: Diagnosis not present

## 2022-06-28 DIAGNOSIS — M6281 Muscle weakness (generalized): Secondary | ICD-10-CM | POA: Diagnosis not present

## 2022-06-28 DIAGNOSIS — E039 Hypothyroidism, unspecified: Secondary | ICD-10-CM | POA: Diagnosis not present

## 2022-06-28 DIAGNOSIS — F411 Generalized anxiety disorder: Secondary | ICD-10-CM | POA: Diagnosis not present

## 2022-06-28 DIAGNOSIS — G43709 Chronic migraine without aura, not intractable, without status migrainosus: Secondary | ICD-10-CM | POA: Diagnosis not present

## 2022-06-28 DIAGNOSIS — K580 Irritable bowel syndrome with diarrhea: Secondary | ICD-10-CM | POA: Diagnosis not present

## 2022-06-28 DIAGNOSIS — F5104 Psychophysiologic insomnia: Secondary | ICD-10-CM | POA: Diagnosis not present

## 2022-06-28 DIAGNOSIS — F02A0 Dementia in other diseases classified elsewhere, mild, without behavioral disturbance, psychotic disturbance, mood disturbance, and anxiety: Secondary | ICD-10-CM | POA: Diagnosis not present

## 2022-06-29 DIAGNOSIS — G894 Chronic pain syndrome: Secondary | ICD-10-CM | POA: Diagnosis not present

## 2022-06-29 DIAGNOSIS — Z79891 Long term (current) use of opiate analgesic: Secondary | ICD-10-CM | POA: Diagnosis not present

## 2022-06-29 DIAGNOSIS — M545 Low back pain, unspecified: Secondary | ICD-10-CM | POA: Diagnosis not present

## 2022-07-03 DIAGNOSIS — E038 Other specified hypothyroidism: Secondary | ICD-10-CM | POA: Diagnosis not present

## 2022-07-03 DIAGNOSIS — M6281 Muscle weakness (generalized): Secondary | ICD-10-CM | POA: Diagnosis not present

## 2022-07-03 DIAGNOSIS — K589 Irritable bowel syndrome without diarrhea: Secondary | ICD-10-CM | POA: Diagnosis not present

## 2022-07-03 DIAGNOSIS — G301 Alzheimer's disease with late onset: Secondary | ICD-10-CM | POA: Diagnosis not present

## 2022-07-03 DIAGNOSIS — F331 Major depressive disorder, recurrent, moderate: Secondary | ICD-10-CM | POA: Diagnosis not present

## 2022-07-04 DIAGNOSIS — Z23 Encounter for immunization: Secondary | ICD-10-CM | POA: Diagnosis not present

## 2022-07-04 DIAGNOSIS — E038 Other specified hypothyroidism: Secondary | ICD-10-CM | POA: Diagnosis not present

## 2022-07-04 DIAGNOSIS — F331 Major depressive disorder, recurrent, moderate: Secondary | ICD-10-CM | POA: Diagnosis not present

## 2022-07-05 DIAGNOSIS — M6281 Muscle weakness (generalized): Secondary | ICD-10-CM | POA: Diagnosis not present

## 2022-07-10 DIAGNOSIS — G43709 Chronic migraine without aura, not intractable, without status migrainosus: Secondary | ICD-10-CM | POA: Diagnosis not present

## 2022-07-10 DIAGNOSIS — E039 Hypothyroidism, unspecified: Secondary | ICD-10-CM | POA: Diagnosis not present

## 2022-07-10 DIAGNOSIS — R7989 Other specified abnormal findings of blood chemistry: Secondary | ICD-10-CM | POA: Diagnosis not present

## 2022-07-10 DIAGNOSIS — E559 Vitamin D deficiency, unspecified: Secondary | ICD-10-CM | POA: Diagnosis not present

## 2022-07-10 DIAGNOSIS — M6281 Muscle weakness (generalized): Secondary | ICD-10-CM | POA: Diagnosis not present

## 2022-07-12 DIAGNOSIS — M545 Low back pain, unspecified: Secondary | ICD-10-CM | POA: Diagnosis not present

## 2022-07-12 DIAGNOSIS — M6281 Muscle weakness (generalized): Secondary | ICD-10-CM | POA: Diagnosis not present

## 2022-07-12 DIAGNOSIS — R2689 Other abnormalities of gait and mobility: Secondary | ICD-10-CM | POA: Diagnosis not present

## 2022-07-12 DIAGNOSIS — W010XXA Fall on same level from slipping, tripping and stumbling without subsequent striking against object, initial encounter: Secondary | ICD-10-CM | POA: Diagnosis not present

## 2022-07-18 DIAGNOSIS — M6281 Muscle weakness (generalized): Secondary | ICD-10-CM | POA: Diagnosis not present

## 2022-07-20 DIAGNOSIS — M6281 Muscle weakness (generalized): Secondary | ICD-10-CM | POA: Diagnosis not present

## 2022-07-24 DIAGNOSIS — M6281 Muscle weakness (generalized): Secondary | ICD-10-CM | POA: Diagnosis not present

## 2022-07-25 DIAGNOSIS — M6281 Muscle weakness (generalized): Secondary | ICD-10-CM | POA: Diagnosis not present

## 2022-07-26 DIAGNOSIS — G43709 Chronic migraine without aura, not intractable, without status migrainosus: Secondary | ICD-10-CM | POA: Diagnosis not present

## 2022-07-26 DIAGNOSIS — K589 Irritable bowel syndrome without diarrhea: Secondary | ICD-10-CM | POA: Diagnosis not present

## 2022-07-26 DIAGNOSIS — E559 Vitamin D deficiency, unspecified: Secondary | ICD-10-CM | POA: Diagnosis not present

## 2022-07-26 DIAGNOSIS — E039 Hypothyroidism, unspecified: Secondary | ICD-10-CM | POA: Diagnosis not present

## 2022-07-26 DIAGNOSIS — R2689 Other abnormalities of gait and mobility: Secondary | ICD-10-CM | POA: Diagnosis not present

## 2022-08-01 ENCOUNTER — Ambulatory Visit: Payer: Medicare Other | Admitting: Physician Assistant

## 2022-08-01 ENCOUNTER — Ambulatory Visit: Payer: Medicare Other | Admitting: Nurse Practitioner

## 2022-08-02 DIAGNOSIS — R300 Dysuria: Secondary | ICD-10-CM | POA: Diagnosis not present

## 2022-08-02 DIAGNOSIS — M6281 Muscle weakness (generalized): Secondary | ICD-10-CM | POA: Diagnosis not present

## 2022-08-03 ENCOUNTER — Other Ambulatory Visit: Payer: Self-pay | Admitting: Neurology

## 2022-08-03 DIAGNOSIS — F331 Major depressive disorder, recurrent, moderate: Secondary | ICD-10-CM | POA: Diagnosis not present

## 2022-08-03 DIAGNOSIS — M6281 Muscle weakness (generalized): Secondary | ICD-10-CM | POA: Diagnosis not present

## 2022-08-03 DIAGNOSIS — E038 Other specified hypothyroidism: Secondary | ICD-10-CM | POA: Diagnosis not present

## 2022-08-03 NOTE — Telephone Encounter (Signed)
Pt is needing a refill on her pregabalin (LYRICA) 100 MG capsule sent to the [facility] in [place], [place]

## 2022-08-07 DIAGNOSIS — M6281 Muscle weakness (generalized): Secondary | ICD-10-CM | POA: Diagnosis not present

## 2022-08-07 MED ORDER — PREGABALIN 100 MG PO CAPS: 100.0000 mg | ORAL_CAPSULE | Freq: Two times a day (BID) | ORAL | 1 refills | Status: DC

## 2022-08-07 NOTE — Telephone Encounter (Signed)
Verify Drug Registry For Pregabalin 100 Mg Capsule Last Filled: 04/26/2022 Quantity: 180 capsules for 90 days Last appointment: 12/06/2021 Next appointment: 12/07/2022

## 2022-08-08 ENCOUNTER — Encounter: Payer: Self-pay | Admitting: Emergency Medicine

## 2022-08-08 ENCOUNTER — Other Ambulatory Visit: Payer: Self-pay

## 2022-08-08 ENCOUNTER — Emergency Department: Payer: Medicare Other

## 2022-08-08 ENCOUNTER — Emergency Department
Admission: EM | Admit: 2022-08-08 | Discharge: 2022-08-08 | Disposition: A | Discharge status: Home / Self Care | Payer: Medicare Other | Attending: Emergency Medicine | Admitting: Emergency Medicine

## 2022-08-08 ENCOUNTER — Encounter: Payer: Self-pay | Admitting: Oncology

## 2022-08-08 DIAGNOSIS — Z96611 Presence of right artificial shoulder joint: Secondary | ICD-10-CM | POA: Diagnosis not present

## 2022-08-08 DIAGNOSIS — R410 Disorientation, unspecified: Secondary | ICD-10-CM | POA: Diagnosis not present

## 2022-08-08 DIAGNOSIS — M545 Low back pain, unspecified: Secondary | ICD-10-CM | POA: Diagnosis not present

## 2022-08-08 DIAGNOSIS — Z96651 Presence of right artificial knee joint: Secondary | ICD-10-CM | POA: Diagnosis not present

## 2022-08-08 DIAGNOSIS — Z85828 Personal history of other malignant neoplasm of skin: Secondary | ICD-10-CM | POA: Diagnosis not present

## 2022-08-08 DIAGNOSIS — R32 Unspecified urinary incontinence: Secondary | ICD-10-CM | POA: Diagnosis not present

## 2022-08-08 DIAGNOSIS — E039 Hypothyroidism, unspecified: Secondary | ICD-10-CM | POA: Insufficient documentation

## 2022-08-08 DIAGNOSIS — Z7982 Long term (current) use of aspirin: Secondary | ICD-10-CM | POA: Diagnosis not present

## 2022-08-08 DIAGNOSIS — M5459 Other low back pain: Secondary | ICD-10-CM | POA: Diagnosis not present

## 2022-08-08 DIAGNOSIS — S32059A Unspecified fracture of fifth lumbar vertebra, initial encounter for closed fracture: Secondary | ICD-10-CM

## 2022-08-08 DIAGNOSIS — G8929 Other chronic pain: Secondary | ICD-10-CM

## 2022-08-08 DIAGNOSIS — Z79899 Other long term (current) drug therapy: Secondary | ICD-10-CM | POA: Diagnosis not present

## 2022-08-08 DIAGNOSIS — I959 Hypotension, unspecified: Secondary | ICD-10-CM | POA: Diagnosis not present

## 2022-08-08 DIAGNOSIS — Z743 Need for continuous supervision: Secondary | ICD-10-CM | POA: Diagnosis not present

## 2022-08-08 DIAGNOSIS — M549 Dorsalgia, unspecified: Secondary | ICD-10-CM | POA: Diagnosis not present

## 2022-08-08 MED ORDER — OXYCODONE-ACETAMINOPHEN 5-325 MG PO TABS
1.0000 | ORAL_TABLET | Freq: Once | ORAL | Status: AC
  Administered 2022-08-08: 1 via ORAL
  Filled 2022-08-08: qty 1

## 2022-08-08 MED ORDER — MORPHINE SULFATE (PF) 4 MG/ML IV SOLN: 4.0000 mg | Freq: Once | INTRAVENOUS | Status: DC

## 2022-08-08 MED ORDER — OXYCODONE HCL 5 MG PO TABS
10.0000 mg | ORAL_TABLET | Freq: Once | ORAL | Status: AC
  Administered 2022-08-08: 10 mg via ORAL
  Filled 2022-08-08: qty 2

## 2022-08-08 MED ORDER — ONDANSETRON 4 MG PO TBDP
4.0000 mg | ORAL_TABLET | Freq: Once | ORAL | Status: AC
  Administered 2022-08-08: 4 mg via ORAL
  Filled 2022-08-08: qty 1

## 2022-08-08 MED ORDER — LORAZEPAM 2 MG/ML IJ SOLN: 1.0000 mg | Freq: Once | INTRAMUSCULAR | Status: DC

## 2022-08-08 NOTE — ED Notes (Signed)
Pt refusing IV/Labs. MD notified.

## 2022-08-08 NOTE — ED Notes (Signed)
Called ortho tech for LSO brace  will be delivered to [place]

## 2022-08-08 NOTE — ED Triage Notes (Signed)
To triage via [facility] from [facility] with c/o back pain. Per EMS, pt has chronic back pain and facility has " forgotten" to order pt pain medication x 1 month, and pt had last dose 12/3.  Pt A&O, ambulatory with EMS with minimal assist.

## 2022-08-08 NOTE — Discharge Instructions (Addendum)
You have an appointment scheduled with neurosurgery for follow-up on December 18th for the fracture in your lower back. You should keep the brace in place at all times except for showering and may take your previously prescribed pain medication as needed for pain.

## 2022-08-08 NOTE — ED Provider Notes (Signed)
-----------------------------------------   7:24 AM on 08/08/2022 -----------------------------------------  Blood pressure 110/65, pulse 87, temperature 98.2 F (36.8 C), temperature source Oral, resp. rate 20, height '5\' 4"'$  (1.626 m), weight 108.9 kg, SpO2 96 %.  Assuming care from Dr. Leonides Schanz.  In short, [name] is a 76 y.o. female with a chief complaint of Back Pain .  Refer to the original H&P for additional details.  The current plan of care is to follow-up MRI results for acute on chronic back pain with chronic urinary incontinence.  ----------------------------------------- 11:09 AM on 08/08/2022 ----------------------------------------- MRIs results positive for L5 vertebral fracture with no retropulsion.  Findings reviewed with Dr. Cari Caraway of neurosurgery, who recommends placement in an LSO brace and outpatient follow-up in the clinic.  On reassessment, patient is refusing labs or to provide a urine sample, states that they have had difficulty filling her regular pain medication prescription at her nursing facility and she would only like a refill of pain medication as well as discharge back to the facility.  She has been ambulatory per her baseline here in the ED and is appropriate for discharge home with outpatient follow-up.  I spoke with staff at [facility], who confirmed that her pain medication was refilled last night and will be available for her, she also has high enough level of care to manage at the nursing facility.  They were informed of plan for discharge home with outpatient neurosurgery follow-up.  Patient's son was also updated on plan for discharge with outpatient follow-up.     Blake Divine, MD 08/08/22 [phone]

## 2022-08-08 NOTE — Progress Notes (Signed)
Orthopedic Tech Progress Note Patient Details:  AUDINE MANGIONE [birth year] [id number]  Called in order to [facility] for a TLSO BRACE, they just opened up and may take awhile before getting out there but someone is coming     Patient ID: [name], female   DOB: 02-04-1946, 76 y.o.   MRN: [id number]  Janit Pagan 08/08/2022, 8:17 AM

## 2022-08-08 NOTE — ED Provider Notes (Signed)
[facility] Provider Note    Event Date/Time   First MD Initiated Contact with Patient 08/08/22 [phone]     (approximate)   History   Back Pain   HPI  [name] is a 76 y.o. female with history of dementia, hypothyroidism, obesity who presents to the emergency department with EMS with complaints of lower back pain.  States pain started today but per EMS it is a chronic problem.  She denies numbness, tingling or weakness but pain is worse with movement and palpation.  She states she has had urinary incontinence for years and will pee whenever she stands but denies overflow incontinence, bowel incontinence.  She denies previous back surgery but it appears she has had a previous thoracic spinal surgery as she has an old scar over her lower T-spine.  She denies any epidural injections.  No known fevers.  No urinary symptoms.  Patient lives at [facility].  States she takes pain medication for her back pain but cannot remember what it is called.   History provided by patient and EMS.    Past Medical History:  Diagnosis Date   Anemia    took Fe- orally 2 yrs. ago    Anxiety    panic attacks    Arthritis    shoulders, knees   Cancer (HCC)    skin- preCA   Depression    GERD (gastroesophageal reflux disease)    uses Protonix as needed    Headache(784.0)    Humerus fracture    Right    Hyposmolality and/or hyponatremia 02/27/2013   Hypothyroidism    IBS (irritable bowel syndrome)    Memory loss    Mental disorder    Migraine    Preoperative examination 02/26/2013   Seizures ([place])    caused by depression , seritonin seizure - effected short term memory     Shortness of breath     Past Surgical History:  Procedure Laterality Date   ABDOMINAL HYSTERECTOMY     APPENDECTOMY     BACK SURGERY  1990's   BREAST SURGERY  1972   augmentation    CHOLECYSTECTOMY     GASTRECTOMY  1994   Due to ulcer, required multiple revisions.    HERNIA REPAIR      ORIF ANKLE FRACTURE Right 2004   REPLACEMENT TOTAL KNEE     REVERSE SHOULDER ARTHROPLASTY Right 05/16/2013   Procedure: RIGHT HEMI ARTHROPLASTY  VERSES  REVERSE TOTAL SHOULDER ARTHROPLASTY ;  Surgeon: Augustin Schooling, MD;  Location: [facility];  Service: Orthopedics;  Laterality: Right;   SPINE SURGERY  2005   Lumbar spinal stenosis    STOMACH SURGERY     1/3 resection for obstruction   TONSILLECTOMY     TUBAL LIGATION      MEDICATIONS:  Prior to Admission medications   Medication Sig Start Date End Date Taking? Authorizing Provider  albuterol (VENTOLIN HFA) 108 (90 Base) MCG/ACT inhaler Inhale 2 puffs into the lungs every 6 (six) hours as needed for wheezing or shortness of breath. 01/18/22   Lorrene Reid, PA-C  aspirin 325 MG tablet Take 325 mg by mouth daily.    [provider]  calamine lotion Apply 1 application topically as needed for itching. 03/04/21   Lorrene Reid, PA-C  clotrimazole-betamethasone (LOTRISONE) cream Apply 1 application. topically daily. 11/25/21   Abonza, Maritza, PA-C  Erenumab-aooe (AIMOVIG) 70 MG/ML SOAJ Inject 70 mg into the skin every 30 (thirty) days. 12/06/21   Olegario Messier,  Charlynne Cousins, NP  fluconazole (DIFLUCAN) 150 MG tablet Take 1 tablet (150 mg total) by mouth daily. May repeat a second dose in 72 hrs if needed. 01/18/22   Lorrene Reid, PA-C  furosemide (LASIX) 20 MG tablet Take 1 tablet (20 mg total) by mouth 2 (two) times daily. 03/31/22   Lorrene Reid, PA-C  levothyroxine (SYNTHROID) 100 MCG tablet TAKE 1 TABLET BY MOUTH DAILY 01/16/22   Abonza, Maritza, PA-C  oxyCODONE (ROXICODONE) 15 MG immediate release tablet Take 1 tablet by mouth. May take up to 5 times daily 10/10/18   [provider]  potassium chloride SA (KLOR-CON M) 20 MEQ tablet Take 1 tablet (20 mEq total) by mouth 2 (two) times daily for 5 days. 05/21/22 05/26/22  Rada Hay, MD  pregabalin (LYRICA) 100 MG capsule Take 1 capsule (100 mg total) by mouth 2 (two) times daily. 08/07/22    Suzzanne Cloud, NP  ramelteon (ROZEREM) 8 MG tablet Take 1 tablet by mouth daily. 11/26/18   [provider]  tiZANidine (ZANAFLEX) 4 MG tablet Take 1 tablet (4 mg total) by mouth every 6 (six) hours as needed for muscle spasms. 05/19/21   Marcial Pacas, MD  traZODone (DESYREL) 50 MG tablet Take 50-150 mg at bedtime as needed by mouth for sleep (and may take one additional 50 mg tablet daily AS NEEDED for part of a "headache cocktail").     [provider]  Ubrogepant (UBRELVY) 50 MG TABS Take 50 mg by mouth as needed (may repeat dose in 2 hours, not to exceed 100 mg/day, do not take more than 8 days a month). 12/06/21   Suzzanne Cloud, NP  venlafaxine (EFFEXOR) 37.5 MG tablet Take 1 tablet (37.5 mg total) by mouth 2 (two) times daily. 12/06/21   Suzzanne Cloud, NP  vortioxetine HBr (TRINTELLIX) 20 MG TABS tablet Take 20 mg by mouth daily. 07/13/16   [provider]    Physical Exam   Triage Vital Signs: ED Triage Vitals  Enc Vitals Group     BP 08/08/22 0436 110/65     Pulse Rate 08/08/22 0436 87     Resp 08/08/22 0436 20     Temp 08/08/22 0436 98.2 F (36.8 C)     Temp Source 08/08/22 0436 Oral     SpO2 08/08/22 0436 96 %     Weight 08/08/22 0438 240 lb (108.9 kg)     Height 08/08/22 0438 '5\' 4"'$  (1.626 m)     Head Circumference --      Peak Flow --      Pain Score 08/08/22 0436 10     Pain Loc --      Pain Edu? --      Excl. in [place]? --     Most recent vital signs: Vitals:   08/08/22 0436  BP: 110/65  Pulse: 87  Resp: 20  Temp: 98.2 F (36.8 C)  SpO2: 96%    CONSTITUTIONAL: Alert, elderly, obese, appears uncomfortable, able to answer most questions appropriately HEAD: Normocephalic, atraumatic EYES: Conjunctivae clear, pupils appear equal, sclera nonicteric ENT: normal nose; moist mucous membranes NECK: Supple, normal ROM CARD: RRR; S1 and S2 appreciated; no murmurs, no clicks, no rubs, no gallops RESP: Normal chest excursion without splinting or  tachypnea; breath sounds clear and equal bilaterally; no wheezes, no rhonchi, no rales, no hypoxia or respiratory distress, speaking full sentences ABD/GI: Normal bowel sounds; non-distended; soft, non-tender, no rebound, no guarding, no peritoneal signs BACK: The  back appears normal, tender to palpation over the lumbar spine without step-off or deformity.  No soft tissue swelling, redness, warmth, ecchymosis.  No rash or other lesions present. EXT: Normal ROM in all joints; no deformity noted, no edema; no cyanosis SKIN: Normal color for age and race; warm; no rash on exposed skin NEURO: Moves all extremities equally, normal speech, reports normal sensation and no saddle anesthesia, no hyperreflexia or hyperreflexia on exam but exam somewhat limited due to patient's poor cooperation/body habitus/pain level PSYCH: The patient's mood and manner are appropriate.   ED Results / Procedures / Treatments   LABS: (all labs ordered are listed, but only abnormal results are displayed) Labs Reviewed  CBC WITH DIFFERENTIAL/PLATELET  BASIC METABOLIC PANEL  URINALYSIS, ROUTINE W REFLEX MICROSCOPIC     EKG:     RADIOLOGY: My personal review and interpretation of imaging: X-ray of the lumbar spine shows age-indeterminate L1 compression fracture.  I have personally reviewed all radiology reports.   DG Lumbar Spine Complete  Result Date: 08/08/2022 CLINICAL DATA:  Back pain. EXAM: LUMBAR SPINE - COMPLETE 4+ VIEW COMPARISON:  Lumbar spine CT 04/18/2005 FINDINGS: Bones are diffusely demineralized. Status post vertebral augmentation at T12, stable. Compression deformity at L4 is stable to minimally progressive since the remote CT scan. There is new mild anterior wedge compression deformity at L1. Diffuse loss of intervertebral disc height noted in the lumbar spine with lower lumbar facet osteoarthritis. Anchors for ventral mesh noted on the AP projection. There is soft tissue calcification in the buttocks  region, potentially posttraumatic IMPRESSION: 1. Age indeterminate mild anterior wedge compression deformity at L1 is new since 2006. 2. Stable to minimally progressive compression deformity at L4. 3. Status post vertebral augmentation at T12. Electronically Signed   By: Misty [name] M.D.   On: 08/08/2022 06:45     PROCEDURES:  Critical Care performed: No   udies, pulse oximetry and re-evaluation of patient's condition.   Procedures    IMPRESSION / MDM / ASSESSMENT AND PLAN / ED COURSE  I reviewed the triage vital signs and the nursing notes.    Patient here with complaints of lower back pain.  No red flag symptoms.    DIFFERENTIAL DIAGNOSIS (includes but not limited to):   Fracture, spinal stenosis, radiculopathy, arthritis, muscle spasm, muscle strain, less likely osteomyelitis or septic arthritis, discitis, cauda equina   Patient's presentation is most consistent with acute presentation with potential threat to life or bodily function.   PLAN: We will obtain x-rays of the lumbar spine and give pain medication.   MEDICATIONS GIVEN IN ED: Medications  morphine (PF) 4 MG/ML injection 4 mg (has no administration in time range)  oxyCODONE-acetaminophen (PERCOCET/ROXICET) 5-325 MG per tablet 1 tablet (1 tablet Oral Given 08/08/22 0553)  ondansetron (ZOFRAN-ODT) disintegrating tablet 4 mg (4 mg Oral Given 08/08/22 0553)     ED COURSE: She has x-ray reviewed and interpreted by myself and the radiologist and shows an age-indeterminate mild anterior wedge compression deformity at L1.  She continues to moan in significant pain despite oral pain medication.  Will place IV, obtain blood work and noncontrast MRI of her lumbar spine for further evaluation.    Signed out to oncoming ED physician.   CONSULTS: Pending further work-up.   OUTSIDE RECORDS REVIEWED: Reviewed patient's previous orthopedic and neurology notes from 2017 through 2019.       FINAL CLINICAL  IMPRESSION(S) / ED DIAGNOSES   Final diagnoses:  Acute exacerbation of chronic low back  pain     Rx / DC Orders   ED Discharge Orders     None        Note:  This document was prepared using Dragon voice recognition software and may include unintentional dictation errors.   Nelani Schmelzle, Delice Bison, DO 08/08/22 [phone]

## 2022-08-09 DIAGNOSIS — R300 Dysuria: Secondary | ICD-10-CM | POA: Diagnosis not present

## 2022-08-09 DIAGNOSIS — N390 Urinary tract infection, site not specified: Secondary | ICD-10-CM | POA: Diagnosis not present

## 2022-08-10 DIAGNOSIS — M6281 Muscle weakness (generalized): Secondary | ICD-10-CM | POA: Diagnosis not present

## 2022-08-14 DIAGNOSIS — M6281 Muscle weakness (generalized): Secondary | ICD-10-CM | POA: Diagnosis not present

## 2022-08-16 ENCOUNTER — Telehealth: Payer: Self-pay

## 2022-08-16 DIAGNOSIS — W010XXA Fall on same level from slipping, tripping and stumbling without subsequent striking against object, initial encounter: Secondary | ICD-10-CM | POA: Diagnosis not present

## 2022-08-16 DIAGNOSIS — R296 Repeated falls: Secondary | ICD-10-CM | POA: Diagnosis not present

## 2022-08-16 NOTE — Progress Notes (Addendum)
Referring Physician:  Blake Divine, MD [place],  [place] [id number]  Primary Physician:  Lorrene Reid, PA-C  History of Present Illness: 08/21/2022 [name] has a history of dementia, hypothyroidism, and obesity. She is resident at [facility].   History of lumbar surgery (?decompression) in 2005 per her chart. Also with history of kyphoplasty at T12.   She was seen in ED on 08/08/22 with chronic LBP. MRI of lumbar spine in ED showed subacute L5 compression fracture. Also with acute to subacute compression of L3 and L4 along with slip L3-L4 and moderate central stenosis L4-L5 with severe central stenosis L5-S1.   ED spoke with Dr. Izora Ribas and he recommended LSO brace.   She is here for follow up.   She continues with more constant mid/lower back pain that radiates to her hip. She has intermittent pain in her left leg that is more lateral- not sure if it goes to her foot. Leg pain is worse with getting into/out of bed. She is not wearing her brace. Pain in her lower back is worse with standing and walking. She has numbness and tingling in left leg. No weakness.   She is taking oxycodone '15mg'$  four a day from Dr. Nelva Bush for her chronic back pain. She is on prn zanaflex. She is on lyrica as well.   History of bladder incontinence. No bowel issues.   Conservative measures:  Physical therapy: was in PT at [facility], is not going currently per patient.  Multimodal medical therapy including regular antiinflammatories: oxycodone, lyrica, zanaflex.   Injections: no recent epidural steroid injections  Past Surgery: History of lumbar surgery (?decompression) in 2005 per her chart. Also with history of kyphoplasty at T12.    Review of Systems:  A 10 point review of systems is negative, except for the pertinent positives and negatives detailed in the HPI.  Past Medical History: Past Medical History:  Diagnosis Date   Anemia    took Fe- orally 2 yrs. ago     Anxiety    panic attacks    Arthritis    shoulders, knees   Cancer (HCC)    skin- preCA   Depression    GERD (gastroesophageal reflux disease)    uses Protonix as needed    Headache(784.0)    Humerus fracture    Right    Hyposmolality and/or hyponatremia 02/27/2013   Hypothyroidism    IBS (irritable bowel syndrome)    Memory loss    Mental disorder    Migraine    Preoperative examination 02/26/2013   Seizures ([place])    caused by depression , seritonin seizure - effected short term memory     Shortness of breath     Past Surgical History: Past Surgical History:  Procedure Laterality Date   ABDOMINAL HYSTERECTOMY     APPENDECTOMY     BACK SURGERY  1990's   BREAST SURGERY  1972   augmentation    CHOLECYSTECTOMY     GASTRECTOMY  1994   Due to ulcer, required multiple revisions.    HERNIA REPAIR     ORIF ANKLE FRACTURE Right 2004   REPLACEMENT TOTAL KNEE     REVERSE SHOULDER ARTHROPLASTY Right 05/16/2013   Procedure: RIGHT HEMI ARTHROPLASTY  VERSES  REVERSE TOTAL SHOULDER ARTHROPLASTY ;  Surgeon: Augustin Schooling, MD;  Location: [place];  Service: Orthopedics;  Laterality: Right;   SPINE SURGERY  2005   Lumbar spinal stenosis    STOMACH SURGERY  1/3 resection for obstruction   TONSILLECTOMY     TUBAL LIGATION      Allergies: Allergies as of 08/21/2022 - Review Complete 08/08/2022  Allergen Reaction Noted   Bupropion hcl Other (See Comments) 04/15/2009   Dairy aid [tilactase] Diarrhea 05/08/2013   Other  07/02/2020   Penicillins  05/27/2021   Azithromycin Rash 04/15/2009   Erythromycin Rash 04/15/2009    Medications: Outpatient Encounter Medications as of 08/21/2022  Medication Sig   albuterol (VENTOLIN HFA) 108 (90 Base) MCG/ACT inhaler Inhale 2 puffs into the lungs every 6 (six) hours as needed for wheezing or shortness of breath.   aspirin 325 MG tablet Take 325 mg by mouth daily.   calamine lotion Apply 1 application topically as needed for itching.    clotrimazole-betamethasone (LOTRISONE) cream Apply 1 application. topically daily.   Erenumab-aooe (AIMOVIG) 70 MG/ML SOAJ Inject 70 mg into the skin every 30 (thirty) days.   fluconazole (DIFLUCAN) 150 MG tablet Take 1 tablet (150 mg total) by mouth daily. May repeat a second dose in 72 hrs if needed.   furosemide (LASIX) 20 MG tablet Take 1 tablet (20 mg total) by mouth 2 (two) times daily.   levothyroxine (SYNTHROID) 100 MCG tablet TAKE 1 TABLET BY MOUTH DAILY   oxyCODONE (ROXICODONE) 15 MG immediate release tablet Take 1 tablet by mouth. May take up to 5 times daily   potassium chloride SA (KLOR-CON M) 20 MEQ tablet Take 1 tablet (20 mEq total) by mouth 2 (two) times daily for 5 days.   pregabalin (LYRICA) 100 MG capsule Take 1 capsule (100 mg total) by mouth 2 (two) times daily.   ramelteon (ROZEREM) 8 MG tablet Take 1 tablet by mouth daily.   tiZANidine (ZANAFLEX) 4 MG tablet Take 1 tablet (4 mg total) by mouth every 6 (six) hours as needed for muscle spasms.   traZODone (DESYREL) 50 MG tablet Take 50-150 mg at bedtime as needed by mouth for sleep (and may take one additional 50 mg tablet daily AS NEEDED for part of a "headache cocktail").    Ubrogepant (UBRELVY) 50 MG TABS Take 50 mg by mouth as needed (may repeat dose in 2 hours, not to exceed 100 mg/day, do not take more than 8 days a month).   venlafaxine (EFFEXOR) 37.5 MG tablet Take 1 tablet (37.5 mg total) by mouth 2 (two) times daily.   vortioxetine HBr (TRINTELLIX) 20 MG TABS tablet Take 20 mg by mouth daily.   No facility-administered encounter medications on file as of 08/21/2022.    Social History: Social History   Tobacco Use   Smoking status: Never   Smokeless tobacco: Never  Vaping Use   Vaping Use: Never used  Substance Use Topics   Alcohol use: Yes    Comment: rare use of white wine    Drug use: No    Family Medical History: Family History  Problem Relation Age of Onset   Aneurysm Mother    Heart disease  Mother    Cancer Father        pancreatic   Stroke Father 60   Hypertension Father    Alcohol abuse Father    Heart disease Maternal Grandfather     Physical Examination: Vitals:   08/21/22 1124  BP: 124/78    General: Patient is well developed, well nourished, calm, collected, and in no apparent distress. Attention to examination is appropriate.  Respiratory: Patient is breathing without any difficulty.   NEUROLOGICAL:     Awake,  alert, oriented to person, place, and time.  Speech is clear and fluent. Fund of knowledge is appropriate.   Cranial Nerves: Pupils equal round and reactive to light.  Facial tone is symmetric.    ROM of lumbar spine not tested.   She has tenderness over lower lumbar spine.   No abnormal lesions on exposed skin.   Strength: Side Biceps Triceps Deltoid Interossei Grip Wrist Ext. Wrist Flex.  R '5 5 5 5 5 5 5  '$ L '5 5 5 5 5 5 5   '$ Side Iliopsoas Quads Hamstring PF DF EHL  R '5 5 5 5 5 5  '$ L '5 5 5 5 5 5   '$ Reflexes are 2+ and symmetric at the biceps, triceps, brachioradialis, patella and achilles.   Hoffman's is absent.  Clonus is not present.   Bilateral upper and lower extremity sensation is intact to light touch.     Gait not tested. She is in a WC.    Medical Decision Making  Imaging: Lumbar MRI dated 08/08/22:  FINDINGS: Segmentation:  Normal.   Alignment: Chronic grade 1 anterolisthesis of L3 on L4 appears stable since 2018. Levoconvex lumbar scoliosis not significantly changed from that time, apex at L3-L4.   Vertebrae: Chronically augmented T12 compression fracture. Chronic L1 inferior endplate compression is new since 2018 but without marrow edema. L2 remains intact.   There is L3 inferior and L4 superior endplate compression with mild to moderate endplate marrow edema at both levels (series 21, image 11). Mild loss of vertebral height at both levels. Stable chronic L3-L4 spondylolisthesis.   And there is a horizontal  fracture defect through the superior L5 vertebral body, tracking into the anterior pedicles bilaterally (series 9, image 12 on the left) with similar moderate associated marrow edema and very mild superior endplate compression. The remaining L5 posterior elements seem to be intact, aligned. No significant retropulsion.   Intact visible sacrum. Background bone marrow signal within normal limits.   Conus medullaris and cauda equina: Conus extends to the T12-L1 level. No lower spinal cord or conus signal abnormality.   Paraspinal and other soft tissues: No paraspinal edema or fluid collection is evident. Visible abdominal and pelvic viscera remarkable for large bowel diverticulosis.   Disc levels:   Extremely limited axial imaging due to moderate motion degradation despite repeated imaging attempts.   No significant spinal stenosis from the lower thoracic spine through L3, with previous L3 posterior decompression.   Mild to moderate multifactorial spinal stenosis at L4-L5 and moderate to severe spinal stenosis at L5-S1 probably have not significantly changed from the 2018 MRI. Superimposed chronic L5-S1 facet arthropathy also appears similar.   IMPRESSION: 1. Positive for acute to subacute L5 vertebral fracture, with a horizontal fracture cleft tracking through the superior body and into both anterior L5 pedicles. However, other L5 posterior elements appear intact arguing against a Chance fracture. Mild L5 loss of vertebral height. No significant retropulsion. Acute to subacute conventional compression fractures of L3 and L4 also with mild loss of height, no complicating features. Recommend Spine Surgery consultation.   2. Motion degraded axial imaging. Remote L3 posterior decompression. Chronic L3-L4 spondylolisthesis and up to moderate multifactorial L4-L5, severe L5-S1 spinal stenosis probably has not significantly changed since 2018.   3. Chronic T12 and L1 compression  fractures.     Electronically Signed   By: Genevie Ann M.D.   On: 08/08/2022 07:49  I have personally reviewed the images and agree with the above interpretation.  Assessment and Plan: Ms. Guilbert is a pleasant 76 y.o. female with chronic LBP and new L5 compression fracture, may have subacute compression at L3 and L4 as well. She has more constant mid/lower back pain that radiates to her hip. She has intermittent pain in her left leg that is more lateral- not sure if it goes to her foot.   Known L5 compression fracture along with probable subacute compression of L3 and L4. She also has slip L3-L4, moderate central stenosis L4-L5 and severe central stenosis L5-S1. Axial imaging is poor on recent MRI. Current worsening LBP is likely due to fractures.   Treatment options discussed with patient and her son. Following plan made:   - Recommend she continue with LSO brace. Can remove when in bed and when sitting for long periods. Wear when up and walking.  - No bending, twisting, or lifting.  - Continue on chronic oxycodone from Dr. Nelva Bush.  - Lumbar xrays ordered. Can go to [place] across from our office. No appointment needed. The orders are in. Will call her son and also call her with xray results.  - Will likely have follow up in 4 weeks. Please come 1 hour early to get repeat xrays at [place]. Will schedule this once I review her lumbar imaging from today.   I spent a total of 30 minutes in face-to-face and non-face-to-face activities related to this patient's care today including review of outside records, review of imaging, review of symptoms, physical exam, discussion of differential diagnosis, discussion of treatment options, and documentation.   Thank you for involving me in the care of this patient.   Geronimo Boot PA-C Dept. of Neurosurgery

## 2022-08-16 NOTE — Telephone Encounter (Signed)
        Patient  visited [place] on 12/5  Telephone encounter attempt :   1st A HIPAA compliant voice message was left requesting a return call.  Instructed patient to call back     [place], [facility] [employer]  [phone] [address], [place], [place] [address] Phone: [phone] Email: [email or web address]

## 2022-08-17 ENCOUNTER — Telehealth: Payer: Self-pay

## 2022-08-17 DIAGNOSIS — M6281 Muscle weakness (generalized): Secondary | ICD-10-CM | POA: Diagnosis not present

## 2022-08-17 NOTE — Telephone Encounter (Signed)
        Patient  visited [place] on 12/5    Telephone encounter attempt :  2nd  A HIPAA compliant voice message was left requesting a return call.  Instructed patient to call back   [place], [facility] [employer]  [phone] [address], [place], [place] [address] Phone: [phone] Email: [email or web address]

## 2022-08-18 DIAGNOSIS — F331 Major depressive disorder, recurrent, moderate: Secondary | ICD-10-CM | POA: Diagnosis not present

## 2022-08-18 DIAGNOSIS — G301 Alzheimer's disease with late onset: Secondary | ICD-10-CM | POA: Diagnosis not present

## 2022-08-18 DIAGNOSIS — F411 Generalized anxiety disorder: Secondary | ICD-10-CM | POA: Diagnosis not present

## 2022-08-18 DIAGNOSIS — F5104 Psychophysiologic insomnia: Secondary | ICD-10-CM | POA: Diagnosis not present

## 2022-08-18 DIAGNOSIS — F02A0 Dementia in other diseases classified elsewhere, mild, without behavioral disturbance, psychotic disturbance, mood disturbance, and anxiety: Secondary | ICD-10-CM | POA: Diagnosis not present

## 2022-08-21 ENCOUNTER — Ambulatory Visit
Admission: RE | Admit: 2022-08-21 | Discharge: 2022-08-21 | Disposition: A | Discharge status: Home / Self Care | Payer: Medicare Other | Source: Ambulatory Visit | Attending: Orthopedic Surgery | Admitting: Orthopedic Surgery

## 2022-08-21 ENCOUNTER — Encounter: Payer: Self-pay | Admitting: Orthopedic Surgery

## 2022-08-21 ENCOUNTER — Ambulatory Visit
Admission: RE | Admit: 2022-08-21 | Discharge: 2022-08-21 | Disposition: A | Discharge status: Home / Self Care | Payer: Medicare Other | Attending: Orthopedic Surgery | Admitting: Orthopedic Surgery

## 2022-08-21 ENCOUNTER — Ambulatory Visit (INDEPENDENT_AMBULATORY_CARE_PROVIDER_SITE_OTHER): Payer: Medicare Other | Admitting: Orthopedic Surgery

## 2022-08-21 VITALS — BP 124/78 | Ht 64.0 in | Wt 240.0 lb

## 2022-08-21 DIAGNOSIS — S32050A Wedge compression fracture of fifth lumbar vertebra, initial encounter for closed fracture: Secondary | ICD-10-CM

## 2022-08-21 DIAGNOSIS — S32030A Wedge compression fracture of third lumbar vertebra, initial encounter for closed fracture: Secondary | ICD-10-CM

## 2022-08-21 DIAGNOSIS — S32040A Wedge compression fracture of fourth lumbar vertebra, initial encounter for closed fracture: Secondary | ICD-10-CM | POA: Diagnosis not present

## 2022-08-21 NOTE — Addendum Note (Signed)
Addended by: Herb Grays on: 08/21/2022 02:35 PM   Modules accepted: Orders

## 2022-08-22 ENCOUNTER — Telehealth: Payer: Self-pay | Admitting: Orthopedic Surgery

## 2022-08-22 NOTE — Telephone Encounter (Signed)
Patient is at [facility].   I called number in her chart to discuss xray results with her (it is her son). Left message for him to call me back.   No number in the chart for her- if you can get me a number I will call her as well.   If he calls back, let me know. Her xrays looked stable. No change to plan we discussed.   She has f/u with me in 4 weeks and needs xrays prior. Please let them know at [facility] so they can arrange transport. Their number is [phone].

## 2022-08-22 NOTE — Addendum Note (Signed)
Addended byGeronimo Boot on: 08/22/2022 09:31 AM   Modules accepted: Orders

## 2022-08-23 DIAGNOSIS — E038 Other specified hypothyroidism: Secondary | ICD-10-CM | POA: Diagnosis not present

## 2022-08-23 DIAGNOSIS — K589 Irritable bowel syndrome without diarrhea: Secondary | ICD-10-CM | POA: Diagnosis not present

## 2022-08-23 DIAGNOSIS — M6281 Muscle weakness (generalized): Secondary | ICD-10-CM | POA: Diagnosis not present

## 2022-08-23 DIAGNOSIS — E559 Vitamin D deficiency, unspecified: Secondary | ICD-10-CM | POA: Diagnosis not present

## 2022-08-23 DIAGNOSIS — R2689 Other abnormalities of gait and mobility: Secondary | ICD-10-CM | POA: Diagnosis not present

## 2022-08-23 DIAGNOSIS — F331 Major depressive disorder, recurrent, moderate: Secondary | ICD-10-CM | POA: Diagnosis not present

## 2022-08-23 DIAGNOSIS — E039 Hypothyroidism, unspecified: Secondary | ICD-10-CM | POA: Diagnosis not present

## 2022-08-23 DIAGNOSIS — G43709 Chronic migraine without aura, not intractable, without status migrainosus: Secondary | ICD-10-CM | POA: Diagnosis not present

## 2022-08-23 NOTE — Telephone Encounter (Signed)
[place] transporter r/s appt to 09/25/2022.

## 2022-08-23 NOTE — Telephone Encounter (Signed)
[name] will have their transportation person call the office to confirm her appt.

## 2022-08-24 DIAGNOSIS — M6281 Muscle weakness (generalized): Secondary | ICD-10-CM | POA: Diagnosis not present

## 2022-08-30 DIAGNOSIS — M6281 Muscle weakness (generalized): Secondary | ICD-10-CM | POA: Diagnosis not present

## 2022-08-31 DIAGNOSIS — M6281 Muscle weakness (generalized): Secondary | ICD-10-CM | POA: Diagnosis not present

## 2022-09-06 DIAGNOSIS — M6281 Muscle weakness (generalized): Secondary | ICD-10-CM | POA: Diagnosis not present

## 2022-09-06 DIAGNOSIS — R21 Rash and other nonspecific skin eruption: Secondary | ICD-10-CM | POA: Diagnosis not present

## 2022-09-07 DIAGNOSIS — M6281 Muscle weakness (generalized): Secondary | ICD-10-CM | POA: Diagnosis not present

## 2022-09-08 DIAGNOSIS — M6281 Muscle weakness (generalized): Secondary | ICD-10-CM | POA: Diagnosis not present

## 2022-09-14 DIAGNOSIS — M6281 Muscle weakness (generalized): Secondary | ICD-10-CM | POA: Diagnosis not present

## 2022-09-14 DIAGNOSIS — F331 Major depressive disorder, recurrent, moderate: Secondary | ICD-10-CM | POA: Diagnosis not present

## 2022-09-14 DIAGNOSIS — F02A0 Dementia in other diseases classified elsewhere, mild, without behavioral disturbance, psychotic disturbance, mood disturbance, and anxiety: Secondary | ICD-10-CM | POA: Diagnosis not present

## 2022-09-14 DIAGNOSIS — F5104 Psychophysiologic insomnia: Secondary | ICD-10-CM | POA: Diagnosis not present

## 2022-09-14 DIAGNOSIS — G301 Alzheimer's disease with late onset: Secondary | ICD-10-CM | POA: Diagnosis not present

## 2022-09-14 DIAGNOSIS — F411 Generalized anxiety disorder: Secondary | ICD-10-CM | POA: Diagnosis not present

## 2022-09-15 NOTE — Progress Notes (Deleted)
Referring Physician:  Lorrene Reid, PA-C No address on file  Primary Physician:  Lorrene Reid, PA-C  History of Present Illness: 09/15/2022 [name] has a history of dementia, hypothyroidism, and obesity. She is resident at [facility].   History of lumbar surgery (?decompression) in 2005 per her chart. Also with history of kyphoplasty at T12.   Last seen by me on 08/21/22 for new compression fracture at L5 and probable subacute fracture at L3 and L4. History of multiple compression fractures in the past.   She was to continue with LSO brace. No bending, twisting, or lifting.   She is here for follow up and repeat lumbar xrays.         She was seen in ED on 08/08/22 with chronic LBP. MRI of lumbar spine in ED showed subacute L5 compression fracture. Also with acute to subacute compression of L3 and L4 along with slip L3-L4 and moderate central stenosis L4-L5 with severe central stenosis L5-S1.   ED spoke with Dr. Izora Ribas and he recommended LSO brace.   She is here for follow up.   She continues with more constant mid/lower back pain that radiates to her hip. She has intermittent pain in her left leg that is more lateral- not sure if it goes to her foot. Leg pain is worse with getting into/out of bed. She is not wearing her brace. Pain in her lower back is worse with standing and walking. She has numbness and tingling in left leg. No weakness.   She is taking oxycodone 48m four a day from Dr. RNelva Bushfor her chronic back pain. She is on prn zanaflex. She is on lyrica as well.   History of bladder incontinence. No bowel issues.   Conservative measures:  Physical therapy: [facility] is not going currently per patient.  Multimodal medical therapy including regular antiinflammatories: oxycodone, lyrica, zanaflex.   Injections: no recent epidural steroid injections  Past Surgery: History of lumbar surgery (?decompression) in 2005 per her chart. Also with  history of kyphoplasty at T12.    Review of Systems:  A 10 point review of systems is negative, except for the pertinent positives and negatives detailed in the HPI.  Past Medical History: Past Medical History:  Diagnosis Date   Anemia    took Fe- orally 2 yrs. ago    Anxiety    panic attacks    Arthritis    shoulders, knees   Cancer (HCC)    skin- preCA   Depression    GERD (gastroesophageal reflux disease)    uses Protonix as needed    Headache(784.0)    Humerus fracture    Right    Hyposmolality and/or hyponatremia 02/27/2013   Hypothyroidism    IBS (irritable bowel syndrome)    Memory loss    Mental disorder    Migraine    Preoperative examination 02/26/2013   Seizures ([place]    caused by depression , seritonin seizure - effected short term memory     Shortness of breath     Past Surgical History: Past Surgical History:  Procedure Laterality Date   ABDOMINAL HYSTERECTOMY     APPENDECTOMY     BACK SURGERY  1990's   BREAST SURGERY  1972   augmentation    CHOLECYSTECTOMY     GASTRECTOMY  1994   Due to ulcer, required multiple revisions.    HERNIA REPAIR     ORIF ANKLE FRACTURE Right 2004   REPLACEMENT TOTAL KNEE  REVERSE SHOULDER ARTHROPLASTY Right 05/16/2013   Procedure: RIGHT HEMI ARTHROPLASTY  VERSES  REVERSE TOTAL SHOULDER ARTHROPLASTY ;  Surgeon: Augustin Schooling, MD;  Location: [place];  Service: Orthopedics;  Laterality: Right;   SPINE SURGERY  2005   Lumbar spinal stenosis    STOMACH SURGERY     1/3 resection for obstruction   TONSILLECTOMY     TUBAL LIGATION      Allergies: Allergies as of 09/25/2022 - Review Complete 08/21/2022  Allergen Reaction Noted   Bupropion hcl Other (See Comments) 04/15/2009   Dairy aid [tilactase] Diarrhea 05/08/2013   Other  07/02/2020   Penicillins  05/27/2021   Azithromycin Rash 04/15/2009   Erythromycin Rash 04/15/2009    Medications: Outpatient Encounter Medications as of 09/25/2022  Medication Sig   aspirin  325 MG tablet Take 325 mg by mouth daily.   calamine lotion Apply 1 application topically as needed for itching.   clotrimazole-betamethasone (LOTRISONE) cream Apply 1 application. topically daily.   Erenumab-aooe (AIMOVIG) 70 MG/ML SOAJ Inject 70 mg into the skin every 30 (thirty) days.   fluconazole (DIFLUCAN) 150 MG tablet Take 1 tablet (150 mg total) by mouth daily. May repeat a second dose in 72 hrs if needed.   furosemide (LASIX) 20 MG tablet Take 1 tablet (20 mg total) by mouth 2 (two) times daily.   hyoscyamine (LEVSIN) 0.125 MG tablet Take 0.125 mg by mouth 2 (two) times daily.   IMODIUM A-D 2 MG tablet Take 2 mg by mouth as needed.   levothyroxine (SYNTHROID) 100 MCG tablet TAKE 1 TABLET BY MOUTH DAILY   nitrofurantoin, macrocrystal-monohydrate, (MACROBID) 100 MG capsule Take 100 mg by mouth 2 (two) times daily.   oxyCODONE (ROXICODONE) 15 MG immediate release tablet Take 1 tablet by mouth. May take up to 5 times daily   pregabalin (LYRICA) 100 MG capsule Take 1 capsule (100 mg total) by mouth 2 (two) times daily.   ramelteon (ROZEREM) 8 MG tablet Take 1 tablet by mouth daily.   SUMAtriptan (IMITREX) 25 MG tablet Take 25 mg by mouth every 2 (two) hours as needed for migraine. May repeat in 2 hours if headache persists or recurs.   tiZANidine (ZANAFLEX) 4 MG tablet Take 1 tablet (4 mg total) by mouth every 6 (six) hours as needed for muscle spasms.   traZODone (DESYREL) 50 MG tablet Take 50-150 mg at bedtime as needed by mouth for sleep (and may take one additional 50 mg tablet daily AS NEEDED for part of a "headache cocktail").    Ubrogepant (UBRELVY) 50 MG TABS Take 50 mg by mouth as needed (may repeat dose in 2 hours, not to exceed 100 mg/day, do not take more than 8 days a month).   venlafaxine (EFFEXOR) 37.5 MG tablet Take 1 tablet (37.5 mg total) by mouth 2 (two) times daily.   vortioxetine HBr (TRINTELLIX) 20 MG TABS tablet Take 20 mg by mouth daily.   No facility-administered  encounter medications on file as of 09/25/2022.    Social History: Social History   Tobacco Use   Smoking status: Never   Smokeless tobacco: Never  Vaping Use   Vaping Use: Never used  Substance Use Topics   Alcohol use: Yes    Comment: rare use of white wine    Drug use: No    Family Medical History: Family History  Problem Relation Age of Onset   Aneurysm Mother    Heart disease Mother    Cancer Father  pancreatic   Stroke Father 56   Hypertension Father    Alcohol abuse Father    Heart disease Maternal Grandfather     Physical Examination: There were no vitals filed for this visit.    Awake, alert, oriented to person, place, and time.  Speech is clear and fluent. Fund of knowledge is appropriate.   Cranial Nerves: Pupils equal round and reactive to light.  Facial tone is symmetric.    ROM of lumbar spine not tested.   She has tenderness over lower lumbar spine.   No abnormal lesions on exposed skin.   Strength: Side Biceps Triceps Deltoid Interossei Grip Wrist Ext. Wrist Flex.  R 5 5 5 5 5 5 5  $ L 5 5 5 5 5 5 5   $ Side Iliopsoas Quads Hamstring PF DF EHL  R 5 5 5 5 5 5  $ L 5 5 5 5 5 5   $ Reflexes are 2+ and symmetric at the biceps, triceps, brachioradialis, patella and achilles.   Hoffman's is absent.  Clonus is not present.   Bilateral upper and lower extremity sensation is intact to light touch.     Gait not tested. She is in a WC.    Medical Decision Making  Imaging: Lumbar xrays dated ***:  ***  Radiology report not available for my review.    Assessment and Plan: Ms. Akopyan is a pleasant 77 y.o. female with chronic LBP and new L5 compression fracture, may have subacute compression at L3 and L4 as well. She has more constant mid/lower back pain that radiates to her hip. She has intermittent pain in her left leg that is more lateral- not sure if it goes to her foot.   Known L5 compression fracture along with probable subacute compression of  L3 and L4. She also has slip L3-L4, moderate central stenosis L4-L5 and severe central stenosis L5-S1. Axial imaging is poor on recent MRI. Current worsening LBP is likely due to fractures.   Treatment options discussed with patient and her son. Following plan made:   - Recommend she continue with LSO brace. Can remove when in bed and when sitting for long periods. Wear when up and walking.  - No bending, twisting, or lifting.  - Continue on chronic oxycodone from Dr. Nelva Bush.  - Lumbar xrays ordered. Can go to [place] across from our office. No appointment needed. The orders are in. Will call her son and also call her with xray results.  - Will likely have follow up in 4 weeks. Please come 1 hour early to get repeat xrays at [place]. Will schedule this once I review her lumbar imaging from today.   I spent a total of 30 minutes in face-to-face and non-face-to-face activities related to this patient's care today including review of outside records, review of imaging, review of symptoms, physical exam, discussion of differential diagnosis, discussion of treatment options, and documentation.   Thank you for involving me in the care of this patient.   ADDENDUM 08/22/22:  Xrays of lumbar spine dated 08/21/22:  FINDINGS: Limited evaluation due to overlapping osseous structures and overlying soft tissues. Five non-rib-bearing lumbar vertebral bodies. Chronic T12 compression fracture status post kyphoplasty. Similar-appearing L1 and L4 compression fracture with at least 45% vertebral body height loss. Similar-appearing mild L5 compression fracture. Multilevel moderate severe degenerative changes spine. There is no evidence of new acute lumbar spine fracture. Similar-appearing grade 1 anterolisthesis of L3 on L4. Based intervertebral disc space narrowing at  the L4-L5 and L5-S1 levels.   Hernia repair mesh tacks noted.  Bilateral breast implants noted.    IMPRESSION: No acute displaced fracture or traumatic listhesis of the lumbar spine in a patient with chronic T12, L1, L4, L5 compression fractures. Limited evaluation due to overlapping osseous structures and overlying soft tissues.     Electronically Signed   By: Iven Finn M.D.   On: 08/22/2022 01:59  L5 fracture appears stable on above xrays. Subacute fractures of L3 and L4 stable as well. No change to above plan. Recommend she follow up in 4 weeks as above.   Will call patient and son and let them know.   Geronimo Boot PA-C Dept. of Neurosurgery

## 2022-09-18 DIAGNOSIS — M6281 Muscle weakness (generalized): Secondary | ICD-10-CM | POA: Diagnosis not present

## 2022-09-19 DIAGNOSIS — K589 Irritable bowel syndrome without diarrhea: Secondary | ICD-10-CM | POA: Diagnosis not present

## 2022-09-19 DIAGNOSIS — R2689 Other abnormalities of gait and mobility: Secondary | ICD-10-CM | POA: Diagnosis not present

## 2022-09-19 DIAGNOSIS — E559 Vitamin D deficiency, unspecified: Secondary | ICD-10-CM | POA: Diagnosis not present

## 2022-09-19 DIAGNOSIS — R21 Rash and other nonspecific skin eruption: Secondary | ICD-10-CM | POA: Diagnosis not present

## 2022-09-19 DIAGNOSIS — E039 Hypothyroidism, unspecified: Secondary | ICD-10-CM | POA: Diagnosis not present

## 2022-09-19 DIAGNOSIS — G43709 Chronic migraine without aura, not intractable, without status migrainosus: Secondary | ICD-10-CM | POA: Diagnosis not present

## 2022-09-21 DIAGNOSIS — M6281 Muscle weakness (generalized): Secondary | ICD-10-CM | POA: Diagnosis not present

## 2022-09-24 DIAGNOSIS — Z6838 Body mass index (BMI) 38.0-38.9, adult: Secondary | ICD-10-CM | POA: Diagnosis not present

## 2022-09-24 DIAGNOSIS — E079 Disorder of thyroid, unspecified: Secondary | ICD-10-CM | POA: Diagnosis not present

## 2022-09-24 DIAGNOSIS — Z79890 Hormone replacement therapy (postmenopausal): Secondary | ICD-10-CM | POA: Diagnosis not present

## 2022-09-24 DIAGNOSIS — M1990 Unspecified osteoarthritis, unspecified site: Secondary | ICD-10-CM | POA: Diagnosis not present

## 2022-09-24 DIAGNOSIS — R0781 Pleurodynia: Secondary | ICD-10-CM | POA: Diagnosis not present

## 2022-09-24 DIAGNOSIS — Z881 Allergy status to other antibiotic agents status: Secondary | ICD-10-CM | POA: Diagnosis not present

## 2022-09-24 DIAGNOSIS — R0789 Other chest pain: Secondary | ICD-10-CM | POA: Diagnosis not present

## 2022-09-24 DIAGNOSIS — E669 Obesity, unspecified: Secondary | ICD-10-CM | POA: Diagnosis not present

## 2022-09-24 DIAGNOSIS — E039 Hypothyroidism, unspecified: Secondary | ICD-10-CM | POA: Diagnosis not present

## 2022-09-24 DIAGNOSIS — Z880 Allergy status to penicillin: Secondary | ICD-10-CM | POA: Diagnosis not present

## 2022-09-24 DIAGNOSIS — K219 Gastro-esophageal reflux disease without esophagitis: Secondary | ICD-10-CM | POA: Insufficient documentation

## 2022-09-24 DIAGNOSIS — Z79899 Other long term (current) drug therapy: Secondary | ICD-10-CM | POA: Diagnosis not present

## 2022-09-24 DIAGNOSIS — S2232XA Fracture of one rib, left side, initial encounter for closed fracture: Secondary | ICD-10-CM | POA: Diagnosis not present

## 2022-09-24 DIAGNOSIS — W19XXXA Unspecified fall, initial encounter: Secondary | ICD-10-CM | POA: Diagnosis not present

## 2022-09-25 ENCOUNTER — Ambulatory Visit: Payer: Medicare Other | Admitting: Orthopedic Surgery

## 2022-09-25 DIAGNOSIS — S32040S Wedge compression fracture of fourth lumbar vertebra, sequela: Secondary | ICD-10-CM

## 2022-09-25 DIAGNOSIS — M6281 Muscle weakness (generalized): Secondary | ICD-10-CM | POA: Diagnosis not present

## 2022-09-25 DIAGNOSIS — S32050D Wedge compression fracture of fifth lumbar vertebra, subsequent encounter for fracture with routine healing: Secondary | ICD-10-CM

## 2022-09-25 DIAGNOSIS — S32030S Wedge compression fracture of third lumbar vertebra, sequela: Secondary | ICD-10-CM

## 2022-09-26 ENCOUNTER — Ambulatory Visit: Payer: Medicare Other | Admitting: Orthopedic Surgery

## 2022-09-28 DIAGNOSIS — F331 Major depressive disorder, recurrent, moderate: Secondary | ICD-10-CM | POA: Diagnosis not present

## 2022-09-28 DIAGNOSIS — M6281 Muscle weakness (generalized): Secondary | ICD-10-CM | POA: Diagnosis not present

## 2022-09-28 DIAGNOSIS — E038 Other specified hypothyroidism: Secondary | ICD-10-CM | POA: Diagnosis not present

## 2022-10-02 DIAGNOSIS — M6281 Muscle weakness (generalized): Secondary | ICD-10-CM | POA: Diagnosis not present

## 2022-10-04 DIAGNOSIS — M6281 Muscle weakness (generalized): Secondary | ICD-10-CM | POA: Diagnosis not present

## 2022-10-06 ENCOUNTER — Telehealth: Payer: Self-pay | Admitting: Diagnostic Neuroimaging

## 2022-10-06 MED ORDER — PREGABALIN 100 MG PO CAPS: 100.0000 mg | ORAL_CAPSULE | Freq: Two times a day (BID) | ORAL | 1 refills | Status: DC

## 2022-10-06 NOTE — Telephone Encounter (Signed)
Meds ordered this encounter  Medications   pregabalin (LYRICA) 100 MG capsule    Sig: Take 1 capsule (100 mg total) by mouth 2 (two) times daily.    Dispense:  180 capsule    Refill:  1    Penni Bombard, MD 03/12/2177, 3:75 PM Certified in Neurology, Neurophysiology and Neuroimaging  [facility] [address] [place], [place] [address] [phone]

## 2022-10-09 DIAGNOSIS — M6281 Muscle weakness (generalized): Secondary | ICD-10-CM | POA: Diagnosis not present

## 2022-10-10 DIAGNOSIS — F0280 Dementia in other diseases classified elsewhere without behavioral disturbance: Secondary | ICD-10-CM | POA: Diagnosis not present

## 2022-10-10 DIAGNOSIS — F331 Major depressive disorder, recurrent, moderate: Secondary | ICD-10-CM | POA: Diagnosis not present

## 2022-10-10 DIAGNOSIS — E038 Other specified hypothyroidism: Secondary | ICD-10-CM | POA: Diagnosis not present

## 2022-10-10 DIAGNOSIS — G301 Alzheimer's disease with late onset: Secondary | ICD-10-CM | POA: Diagnosis not present

## 2022-10-12 DIAGNOSIS — M6281 Muscle weakness (generalized): Secondary | ICD-10-CM | POA: Diagnosis not present

## 2022-10-13 NOTE — Progress Notes (Unsigned)
Referring Physician:  Lorrene Reid, PA-C No address on file  Primary Physician:  Belva Agee, NP  History of Present Illness: [name] has a history of dementia, hypothyroidism, and obesity. She is resident at [facility].   History of lumbar surgery (?decompression) in 2005 per her chart. Also with history of kyphoplasty at T12.   Last seen by me on 08/21/22 for new compression fracture at L5 and probable subacute fracture at L3 and L4. History of multiple compression fractures in the past.   She was to continue with LSO brace. No bending, twisting, or lifting.   She is here for follow up and repeat lumbar xrays.   She continues with more constant LBP with some radiation of pain into her hips. She also has intermittent pain in her left leg that is more lateral and generally goes to knee. She is not wearing her brace- it does not fit and she cannot put it on herself. Pain in her lower back is worse with standing and walking. She has numbness and tingling in left leg. No weakness.   She is taking oxycodone 25m up to 5 a day from Dr. RNelva Bushfor her chronic back pain- she states she is not being given this at her facility. She is on prn zanaflex. She is on lyrica as well.    Conservative measures:  [facility] is not [facility] currently per patient.  Multimodal medical therapy including regular antiinflammatories: oxycodone, lyrica, zanaflex.   Injections: no recent epidural steroid injections  Past Surgery: History of lumbar surgery (?decompression) in 2005 per her chart. Also with history of kyphoplasty at T12.    Review of Systems:  A 10 point review of systems is negative, except for the pertinent positives and negatives detailed in the HPI.  Past Medical History: Past Medical History:  Diagnosis Date   Anemia    took Fe- orally 2 yrs. ago    Anxiety    panic attacks    Arthritis    shoulders, knees   Cancer (HCC)    skin- preCA    Depression    GERD (gastroesophageal reflux disease)    uses Protonix as needed    Headache(784.0)    Humerus fracture    Right    Hyposmolality and/or hyponatremia 02/27/2013   Hypothyroidism    IBS (irritable bowel syndrome)    Memory loss    Mental disorder    Migraine    Preoperative examination 02/26/2013   [place]    caused by depression , seritonin seizure - effected short term memory     Shortness of breath     Past Surgical History: Past Surgical History:  Procedure Laterality Date   ABDOMINAL HYSTERECTOMY     APPENDECTOMY     BACK SURGERY  1990's   BREAST SURGERY  1972   augmentation    CHOLECYSTECTOMY     GASTRECTOMY  1994   Due to ulcer, required multiple revisions.    HERNIA REPAIR     ORIF ANKLE FRACTURE Right 2004   REPLACEMENT TOTAL KNEE     REVERSE SHOULDER ARTHROPLASTY Right 05/16/2013   Procedure: RIGHT HEMI ARTHROPLASTY  VERSES  REVERSE TOTAL SHOULDER ARTHROPLASTY ;  Surgeon: SAugustin Schooling MD;  [facility]  Service: [facility]opedics;  Laterality: Right;   SPINE SURGERY  2005   Lumbar spinal stenosis    STOMACH SURGERY     1/3 resection for obstruction   TONSILLECTOMY  TUBAL LIGATION      Allergies: Allergies as of 10/16/2022 - Review Complete 10/16/2022  Allergen Reaction Noted   Bupropion hcl Other (See Comments) 04/15/2009   Dairy aid [tilactase] Diarrhea 05/08/2013   Other  07/02/2020   Penicillins  05/27/2021   Azithromycin Rash 04/15/2009   Erythromycin Rash 04/15/2009    Medications: Outpatient Encounter Medications as of 10/16/2022  Medication Sig   aspirin 325 MG tablet Take 325 mg by mouth daily.   calamine lotion Apply 1 application topically as needed for itching.   clotrimazole-betamethasone (LOTRISONE) cream Apply 1 application. topically daily.   Erenumab-aooe (AIMOVIG) 70 MG/ML SOAJ Inject 70 mg into the skin every 30 (thirty) days.   fluconazole (DIFLUCAN) 150 MG tablet Take 1 tablet (150 mg total) by mouth  daily. May repeat a second dose in 72 hrs if needed.   furosemide (LASIX) 20 MG tablet Take 1 tablet (20 mg total) by mouth 2 (two) times daily.   hyoscyamine (LEVSIN) 0.125 MG tablet Take 0.125 mg by mouth 2 (two) times daily.   hyoscyamine (LEVSIN) 0.125 MG tablet Take 0.125 mg by mouth every 4 (four) hours as needed.   IMODIUM A-D 2 MG tablet Take 2 mg by mouth as needed.   levothyroxine (SYNTHROID) 100 MCG tablet TAKE 1 TABLET BY MOUTH DAILY   levothyroxine (SYNTHROID) 100 MCG tablet Take 1 tablet by mouth daily.   loperamide (IMODIUM A-D) 2 MG tablet Take by mouth.   nitrofurantoin, macrocrystal-monohydrate, (MACROBID) 100 MG capsule Take 100 mg by mouth 2 (two) times daily.   oxyCODONE (ROXICODONE) 15 MG immediate release tablet Take 1 tablet by mouth. May take up to 5 times daily   pregabalin (LYRICA) 100 MG capsule Take 1 capsule (100 mg total) by mouth 2 (two) times daily.   ramelteon (ROZEREM) 8 MG tablet Take 1 tablet by mouth daily.   SUMAtriptan (IMITREX) 25 MG tablet Take 25 mg by mouth every 2 (two) hours as needed for migraine. May repeat in 2 hours if headache persists or recurs.   tiZANidine (ZANAFLEX) 4 MG tablet Take 1 tablet (4 mg total) by mouth every 6 (six) hours as needed for muscle spasms.   traZODone (DESYREL) 50 MG tablet Take 50-150 mg at bedtime as needed by mouth for sleep (and may take one additional 50 mg tablet daily AS NEEDED for part of a "headache cocktail").    Ubrogepant (UBRELVY) 50 MG TABS Take 50 mg by mouth as needed (may repeat dose in 2 hours, not to exceed 100 mg/day, do not take more than 8 days a month).   venlafaxine (EFFEXOR) 37.5 MG tablet Take 1 tablet (37.5 mg total) by mouth 2 (two) times daily.   vortioxetine HBr (TRINTELLIX) 20 MG TABS tablet Take 20 mg by mouth daily.   No facility-administered encounter medications on file as of 10/16/2022.    Social History: Social History   Tobacco Use   Smoking status: Never   Smokeless tobacco:  Never  Vaping Use   Vaping Use: Never used  Substance Use Topics   Alcohol use: Yes    Comment: rare use of white wine    Drug use: No    Family Medical History: Family History  Problem Relation Age of Onset   Aneurysm Mother    Heart disease Mother    Cancer Father        pancreatic   Stroke Father 78   Hypertension Father    Alcohol abuse Father    Heart  disease Maternal Grandfather     Physical Examination: Vitals:   10/16/22 1133  BP: 124/78      Awake, alert, oriented to person, place, and time.  Speech is clear and fluent. Fund of knowledge is appropriate.   Cranial Nerves: Pupils equal round and reactive to light.  Facial tone is symmetric.    ROM of lumbar spine not tested.   She has mild tenderness over lower lumbar spine.   No abnormal lesions on exposed skin.   Strength: Side Biceps Triceps Deltoid Interossei Grip Wrist Ext. Wrist Flex.  R 5 5 5 5 5 5 5  $ L 5 5 5 5 5 5 5   $ Side Iliopsoas Quads Hamstring PF DF EHL  R 5 5 5 5 5 5  $ L 5 5 5 5 5 5   $ Reflexes are 2+ and symmetric at the biceps, triceps, brachioradialis, patella and achilles.   Hoffman's is absent.  Clonus is not present.   Bilateral upper and lower extremity sensation is intact to light touch.     She ambulates with rollator. She walks hunched over.    Medical Decision Making  Imaging: Lumbar xrays dated 10/16/22:  Kyphoplasty at T12. Compression fractures at L3, L4, and L5 appear stable.   Radiology report not available for my review.    Assessment and Plan: Ms. Bautz is a pleasant 77 y.o. female with chronic LBP and new L5 compression fracture, may have subacute compression at L3 and L4 as well.   She has more constant lower back pain that radiates to her hips. She has intermittent pain in her left leg that is more lateral and generally goes to her knee.   Known L5 compression fracture along with probable subacute compression of L3 and L4. She also has slip L3-L4, moderate  central stenosis L4-L5 and severe central stenosis L5-S1. Axial imaging is poor on recent MRI.   Xrays from today show stable compression fractures at L3, L4, and L5.   Treatment options discussed with patient and following plan made:   - Recommend she wear LSO brace as able. Can remove when in bed and when sitting for long periods. Wear when up and walking.  - No bending, twisting, or lifting.  - Continue on chronic oxycodone from Dr. Nelva Bush. This is ordered up to 5 per day. Recommend she take it prn.  - Follow up with me in 4-5 weeks for recheck. Please come 1 hour early to get repeat xrays at [place].  I spent a total of 20 minutes in face-to-face and non-face-to-face activities related to this patient's care today including review of outside records, review of imaging, review of symptoms, physical exam, discussion of differential diagnosis, discussion of treatment options, and documentation.  Geronimo Boot PA-C Dept. of Neurosurgery

## 2022-10-16 ENCOUNTER — Ambulatory Visit (INDEPENDENT_AMBULATORY_CARE_PROVIDER_SITE_OTHER): Payer: Medicare Other | Admitting: Orthopedic Surgery

## 2022-10-16 ENCOUNTER — Ambulatory Visit
Admission: RE | Admit: 2022-10-16 | Discharge: 2022-10-16 | Disposition: A | Discharge status: Home / Self Care | Payer: Medicare Other | Source: Ambulatory Visit | Attending: Orthopedic Surgery | Admitting: Orthopedic Surgery

## 2022-10-16 ENCOUNTER — Ambulatory Visit
Admission: RE | Admit: 2022-10-16 | Discharge: 2022-10-16 | Disposition: A | Discharge status: Home / Self Care | Payer: Medicare Other | Attending: Orthopedic Surgery | Admitting: Orthopedic Surgery

## 2022-10-16 ENCOUNTER — Encounter: Payer: Self-pay | Admitting: Orthopedic Surgery

## 2022-10-16 VITALS — BP 124/78 | Ht 63.0 in

## 2022-10-16 DIAGNOSIS — M6281 Muscle weakness (generalized): Secondary | ICD-10-CM | POA: Diagnosis not present

## 2022-10-16 DIAGNOSIS — S32040D Wedge compression fracture of fourth lumbar vertebra, subsequent encounter for fracture with routine healing: Secondary | ICD-10-CM | POA: Diagnosis not present

## 2022-10-16 DIAGNOSIS — S32030A Wedge compression fracture of third lumbar vertebra, initial encounter for closed fracture: Secondary | ICD-10-CM

## 2022-10-16 DIAGNOSIS — S32030D Wedge compression fracture of third lumbar vertebra, subsequent encounter for fracture with routine healing: Secondary | ICD-10-CM | POA: Diagnosis not present

## 2022-10-16 DIAGNOSIS — S32040A Wedge compression fracture of fourth lumbar vertebra, initial encounter for closed fracture: Secondary | ICD-10-CM

## 2022-10-16 DIAGNOSIS — S32050D Wedge compression fracture of fifth lumbar vertebra, subsequent encounter for fracture with routine healing: Secondary | ICD-10-CM | POA: Diagnosis not present

## 2022-10-16 DIAGNOSIS — S32050A Wedge compression fracture of fifth lumbar vertebra, initial encounter for closed fracture: Secondary | ICD-10-CM | POA: Insufficient documentation

## 2022-10-16 DIAGNOSIS — M8588 Other specified disorders of bone density and structure, other site: Secondary | ICD-10-CM | POA: Diagnosis not present

## 2022-10-18 DIAGNOSIS — R21 Rash and other nonspecific skin eruption: Secondary | ICD-10-CM | POA: Diagnosis not present

## 2022-10-18 DIAGNOSIS — E559 Vitamin D deficiency, unspecified: Secondary | ICD-10-CM | POA: Diagnosis not present

## 2022-10-18 DIAGNOSIS — E039 Hypothyroidism, unspecified: Secondary | ICD-10-CM | POA: Diagnosis not present

## 2022-10-18 DIAGNOSIS — R2689 Other abnormalities of gait and mobility: Secondary | ICD-10-CM | POA: Diagnosis not present

## 2022-10-18 DIAGNOSIS — G4700 Insomnia, unspecified: Secondary | ICD-10-CM | POA: Diagnosis not present

## 2022-10-18 DIAGNOSIS — G43709 Chronic migraine without aura, not intractable, without status migrainosus: Secondary | ICD-10-CM | POA: Diagnosis not present

## 2022-10-18 DIAGNOSIS — K589 Irritable bowel syndrome without diarrhea: Secondary | ICD-10-CM | POA: Diagnosis not present

## 2022-10-19 DIAGNOSIS — M6281 Muscle weakness (generalized): Secondary | ICD-10-CM | POA: Diagnosis not present

## 2022-10-23 DIAGNOSIS — M6281 Muscle weakness (generalized): Secondary | ICD-10-CM | POA: Diagnosis not present

## 2022-10-24 ENCOUNTER — Telehealth: Payer: Self-pay | Admitting: Neurology

## 2022-10-24 NOTE — Telephone Encounter (Signed)
LVM and sent mychart msg informing pt of need to reschedule 12/07/22 appointment - NP out

## 2022-10-25 DIAGNOSIS — M6281 Muscle weakness (generalized): Secondary | ICD-10-CM | POA: Diagnosis not present

## 2022-10-26 DIAGNOSIS — F411 Generalized anxiety disorder: Secondary | ICD-10-CM | POA: Diagnosis not present

## 2022-10-26 DIAGNOSIS — F02A18 Dementia in other diseases classified elsewhere, mild, with other behavioral disturbance: Secondary | ICD-10-CM | POA: Diagnosis not present

## 2022-10-26 DIAGNOSIS — F331 Major depressive disorder, recurrent, moderate: Secondary | ICD-10-CM | POA: Diagnosis not present

## 2022-10-26 DIAGNOSIS — G301 Alzheimer's disease with late onset: Secondary | ICD-10-CM | POA: Diagnosis not present

## 2022-10-26 DIAGNOSIS — F5104 Psychophysiologic insomnia: Secondary | ICD-10-CM | POA: Diagnosis not present

## 2022-11-01 DIAGNOSIS — R2689 Other abnormalities of gait and mobility: Secondary | ICD-10-CM | POA: Diagnosis not present

## 2022-11-01 DIAGNOSIS — G43709 Chronic migraine without aura, not intractable, without status migrainosus: Secondary | ICD-10-CM | POA: Diagnosis not present

## 2022-11-01 DIAGNOSIS — G603 Idiopathic progressive neuropathy: Secondary | ICD-10-CM | POA: Diagnosis not present

## 2022-11-01 DIAGNOSIS — F331 Major depressive disorder, recurrent, moderate: Secondary | ICD-10-CM | POA: Diagnosis not present

## 2022-11-01 DIAGNOSIS — R296 Repeated falls: Secondary | ICD-10-CM | POA: Diagnosis not present

## 2022-11-15 DIAGNOSIS — R296 Repeated falls: Secondary | ICD-10-CM | POA: Diagnosis not present

## 2022-11-15 DIAGNOSIS — G894 Chronic pain syndrome: Secondary | ICD-10-CM | POA: Diagnosis not present

## 2022-11-15 DIAGNOSIS — M545 Low back pain, unspecified: Secondary | ICD-10-CM | POA: Diagnosis not present

## 2022-11-15 DIAGNOSIS — E039 Hypothyroidism, unspecified: Secondary | ICD-10-CM | POA: Diagnosis not present

## 2022-11-15 DIAGNOSIS — E559 Vitamin D deficiency, unspecified: Secondary | ICD-10-CM | POA: Diagnosis not present

## 2022-11-15 DIAGNOSIS — K589 Irritable bowel syndrome without diarrhea: Secondary | ICD-10-CM | POA: Diagnosis not present

## 2022-11-15 DIAGNOSIS — G603 Idiopathic progressive neuropathy: Secondary | ICD-10-CM | POA: Diagnosis not present

## 2022-11-16 ENCOUNTER — Encounter: Payer: Self-pay | Admitting: Oncology

## 2022-11-20 ENCOUNTER — Ambulatory Visit: Payer: Self-pay | Admitting: Orthopedic Surgery

## 2022-11-20 DIAGNOSIS — M25572 Pain in left ankle and joints of left foot: Secondary | ICD-10-CM | POA: Diagnosis not present

## 2022-11-20 DIAGNOSIS — Z79891 Long term (current) use of opiate analgesic: Secondary | ICD-10-CM | POA: Diagnosis not present

## 2022-11-20 DIAGNOSIS — W19XXXA Unspecified fall, initial encounter: Secondary | ICD-10-CM | POA: Diagnosis present

## 2022-11-20 DIAGNOSIS — G894 Chronic pain syndrome: Secondary | ICD-10-CM | POA: Diagnosis not present

## 2022-11-20 DIAGNOSIS — R296 Repeated falls: Secondary | ICD-10-CM | POA: Diagnosis not present

## 2022-11-20 DIAGNOSIS — M545 Low back pain, unspecified: Secondary | ICD-10-CM | POA: Diagnosis not present

## 2022-11-20 DIAGNOSIS — Z79899 Other long term (current) drug therapy: Secondary | ICD-10-CM | POA: Diagnosis not present

## 2022-11-20 DIAGNOSIS — Z5181 Encounter for therapeutic drug level monitoring: Secondary | ICD-10-CM | POA: Diagnosis not present

## 2022-11-22 DIAGNOSIS — G603 Idiopathic progressive neuropathy: Secondary | ICD-10-CM | POA: Diagnosis not present

## 2022-11-22 DIAGNOSIS — F331 Major depressive disorder, recurrent, moderate: Secondary | ICD-10-CM | POA: Diagnosis not present

## 2022-11-24 DIAGNOSIS — F331 Major depressive disorder, recurrent, moderate: Secondary | ICD-10-CM | POA: Diagnosis not present

## 2022-11-24 DIAGNOSIS — F339 Major depressive disorder, recurrent, unspecified: Secondary | ICD-10-CM | POA: Diagnosis not present

## 2022-11-24 DIAGNOSIS — M545 Low back pain, unspecified: Secondary | ICD-10-CM | POA: Diagnosis not present

## 2022-11-24 DIAGNOSIS — E039 Hypothyroidism, unspecified: Secondary | ICD-10-CM | POA: Diagnosis not present

## 2022-11-24 DIAGNOSIS — G301 Alzheimer's disease with late onset: Secondary | ICD-10-CM | POA: Diagnosis not present

## 2022-11-24 DIAGNOSIS — G4700 Insomnia, unspecified: Secondary | ICD-10-CM | POA: Diagnosis not present

## 2022-11-24 DIAGNOSIS — E559 Vitamin D deficiency, unspecified: Secondary | ICD-10-CM | POA: Diagnosis not present

## 2022-11-24 DIAGNOSIS — K589 Irritable bowel syndrome without diarrhea: Secondary | ICD-10-CM | POA: Diagnosis not present

## 2022-11-24 DIAGNOSIS — G894 Chronic pain syndrome: Secondary | ICD-10-CM | POA: Diagnosis not present

## 2022-11-24 DIAGNOSIS — Z9181 History of falling: Secondary | ICD-10-CM | POA: Diagnosis not present

## 2022-11-24 DIAGNOSIS — F411 Generalized anxiety disorder: Secondary | ICD-10-CM | POA: Diagnosis not present

## 2022-11-24 DIAGNOSIS — F5104 Psychophysiologic insomnia: Secondary | ICD-10-CM | POA: Diagnosis not present

## 2022-11-24 DIAGNOSIS — G603 Idiopathic progressive neuropathy: Secondary | ICD-10-CM | POA: Diagnosis not present

## 2022-11-24 DIAGNOSIS — F02A18 Dementia in other diseases classified elsewhere, mild, with other behavioral disturbance: Secondary | ICD-10-CM | POA: Diagnosis not present

## 2022-11-24 DIAGNOSIS — F0393 Unspecified dementia, unspecified severity, with mood disturbance: Secondary | ICD-10-CM | POA: Diagnosis not present

## 2022-11-24 DIAGNOSIS — G43709 Chronic migraine without aura, not intractable, without status migrainosus: Secondary | ICD-10-CM | POA: Diagnosis not present

## 2022-11-24 NOTE — Progress Notes (Unsigned)
Referring Physician:  No referring provider defined for this encounter.  Primary Physician:  Belva Agee, NP  History of Present Illness: [name] has a history of dementia, hypothyroidism, and obesity. She is resident at [facility].   History of lumbar surgery (?decompression) in 2005 per her chart. Also with history of kyphoplasty at T12.   Last seen by me on 10/16/22 for new compression fracture at L5 and probable subacute fracture at L3 and L4 that were found on imaging around 08/08/22. History of multiple compression fractures in the past.   She was to continue with LSO brace. No bending, twisting, or lifting.   She is here for follow up and repeat lumbar xrays.      She continues with more constant LBP with some radiation of pain into her hips. She also has intermittent pain in her left leg that is more lateral and generally goes to knee. She is not wearing her brace- it does not fit and she cannot put it on herself. Pain in her lower back is worse with standing and walking. She has numbness and tingling in left leg. No weakness.   She is taking oxycodone 15mg  up to 5 a day from Dr. Nelva Bush for her chronic back pain- she states she is not being given this at her facility. She is on prn zanaflex. She is on lyrica as well.    Conservative measures:  Physical therapy: was in PT at [facility], is not going currently per patient. *** Multimodal medical therapy including regular antiinflammatories: oxycodone, lyrica, zanaflex.   Injections: no recent epidural steroid injections  Past Surgery: History of lumbar surgery (?decompression) in 2005 per her chart. Also with history of kyphoplasty at T12.    Review of Systems:  A 10 point review of systems is negative, except for the pertinent positives and negatives detailed in the HPI.  Past Medical History: Past Medical History:  Diagnosis Date   Anemia    took Fe- orally 2 yrs. ago    Anxiety    panic attacks    Arthritis     shoulders, knees   Cancer (HCC)    skin- preCA   Depression    GERD (gastroesophageal reflux disease)    uses Protonix as needed    Headache(784.0)    Humerus fracture    Right    Hyposmolality and/or hyponatremia 02/27/2013   Hypothyroidism    IBS (irritable bowel syndrome)    Memory loss    Mental disorder    Migraine    Preoperative examination 02/26/2013   Seizures ([place])    caused by depression , seritonin seizure - effected short term memory     Shortness of breath     Past Surgical History: Past Surgical History:  Procedure Laterality Date   ABDOMINAL HYSTERECTOMY     APPENDECTOMY     BACK SURGERY  1990's   BREAST SURGERY  1972   augmentation    CHOLECYSTECTOMY     GASTRECTOMY  1994   Due to ulcer, required multiple revisions.    HERNIA REPAIR     ORIF ANKLE FRACTURE Right 2004   REPLACEMENT TOTAL KNEE     REVERSE SHOULDER ARTHROPLASTY Right 05/16/2013   Procedure: RIGHT HEMI ARTHROPLASTY  VERSES  REVERSE TOTAL SHOULDER ARTHROPLASTY ;  Surgeon: Augustin Schooling, MD;  Location: [facility];  Service: Orthopedics;  Laterality: Right;   SPINE SURGERY  2005   Lumbar spinal stenosis    STOMACH SURGERY  1/3 resection for obstruction   TONSILLECTOMY     TUBAL LIGATION      Allergies: Allergies as of 11/27/2022 - Review Complete 10/16/2022  Allergen Reaction Noted   Bupropion hcl Other (See Comments) 04/15/2009   Dairy aid [tilactase] Diarrhea 05/08/2013   Other  07/02/2020   Penicillins  05/27/2021   Azithromycin Rash 04/15/2009   Erythromycin Rash 04/15/2009    Medications: Outpatient Encounter Medications as of 11/27/2022  Medication Sig   aspirin 325 MG tablet Take 325 mg by mouth daily.   calamine lotion Apply 1 application topically as needed for itching.   clotrimazole-betamethasone (LOTRISONE) cream Apply 1 application. topically daily.   Erenumab-aooe (AIMOVIG) 70 MG/ML SOAJ Inject 70 mg into the skin every 30 (thirty) days.   fluconazole (DIFLUCAN)  150 MG tablet Take 1 tablet (150 mg total) by mouth daily. May repeat a second dose in 72 hrs if needed.   furosemide (LASIX) 20 MG tablet Take 1 tablet (20 mg total) by mouth 2 (two) times daily.   hyoscyamine (LEVSIN) 0.125 MG tablet Take 0.125 mg by mouth 2 (two) times daily.   hyoscyamine (LEVSIN) 0.125 MG tablet Take 0.125 mg by mouth every 4 (four) hours as needed.   IMODIUM A-D 2 MG tablet Take 2 mg by mouth as needed.   levothyroxine (SYNTHROID) 100 MCG tablet TAKE 1 TABLET BY MOUTH DAILY   levothyroxine (SYNTHROID) 100 MCG tablet Take 1 tablet by mouth daily.   loperamide (IMODIUM A-D) 2 MG tablet Take by mouth.   nitrofurantoin, macrocrystal-monohydrate, (MACROBID) 100 MG capsule Take 100 mg by mouth 2 (two) times daily.   oxyCODONE (ROXICODONE) 15 MG immediate release tablet Take 1 tablet by mouth. May take up to 5 times daily   pregabalin (LYRICA) 100 MG capsule Take 1 capsule (100 mg total) by mouth 2 (two) times daily.   ramelteon (ROZEREM) 8 MG tablet Take 1 tablet by mouth daily.   SUMAtriptan (IMITREX) 25 MG tablet Take 25 mg by mouth every 2 (two) hours as needed for migraine. May repeat in 2 hours if headache persists or recurs.   tiZANidine (ZANAFLEX) 4 MG tablet Take 1 tablet (4 mg total) by mouth every 6 (six) hours as needed for muscle spasms.   traZODone (DESYREL) 50 MG tablet Take 50-150 mg at bedtime as needed by mouth for sleep (and may take one additional 50 mg tablet daily AS NEEDED for part of a "headache cocktail").    Ubrogepant (UBRELVY) 50 MG TABS Take 50 mg by mouth as needed (may repeat dose in 2 hours, not to exceed 100 mg/day, do not take more than 8 days a month).   venlafaxine (EFFEXOR) 37.5 MG tablet Take 1 tablet (37.5 mg total) by mouth 2 (two) times daily.   vortioxetine HBr (TRINTELLIX) 20 MG TABS tablet Take 20 mg by mouth daily.   No facility-administered encounter medications on file as of 11/27/2022.    Social History: Social History   Tobacco  Use   Smoking status: Never   Smokeless tobacco: Never  Vaping Use   Vaping Use: Never used  Substance Use Topics   Alcohol use: Yes    Comment: rare use of white wine    Drug use: No    Family Medical History: Family History  Problem Relation Age of Onset   Aneurysm Mother    Heart disease Mother    Cancer Father        pancreatic   Stroke Father 40   Hypertension  Father    Alcohol abuse Father    Heart disease Maternal Grandfather     Physical Examination: There were no vitals filed for this visit.  Awake, alert, oriented to person, place, and time.  Speech is clear and fluent. Fund of knowledge is appropriate.   Cranial Nerves: Pupils equal round and reactive to light.  Facial tone is symmetric.    ROM of lumbar spine not tested.   She has mild tenderness over lower lumbar spine.   No abnormal lesions on exposed skin.   Strength: Side Biceps Triceps Deltoid Interossei Grip Wrist Ext. Wrist Flex.  R 5 5 5 5 5 5 5   L 5 5 5 5 5 5 5    Side Iliopsoas Quads Hamstring PF DF EHL  R 5 5 5 5 5 5   L 5 5 5 5 5 5    Reflexes are 2+ and symmetric at the biceps, triceps, brachioradialis, patella and achilles.   Hoffman's is absent.  Clonus is not present.   Bilateral upper and lower extremity sensation is intact to light touch.     She ambulates with rollator. She walks hunched over.    Medical Decision Making  Imaging: Lumbar xrays dated ***:  Kyphoplasty at T12. Compression fractures at L3, L4, and L5 appear stable. ***  Radiology report not available for my review.    Assessment and Plan: [name] is a pleasant 77 y.o. female with chronic LBP and new L5 compression fracture, may have subacute compression at L3 and L4 as well.   She has more constant lower back pain that radiates to her hips. She has intermittent pain in her left leg that is more lateral and generally goes to her knee.   Known L5 compression fracture along with probable subacute compression of  L3 and L4. She also has slip L3-L4, moderate central stenosis L4-L5 and severe central stenosis L5-S1. Axial imaging is poor on recent MRI.   Xrays from today show stable compression fractures at L3, L4, and L5.   Treatment options discussed with patient and following plan made:   - Recommend she wear LSO brace as able. Can remove when in bed and when sitting for long periods. Wear when up and walking.  - No bending, twisting, or lifting.  - Continue on chronic oxycodone from Dr. Nelva Bush. This is ordered up to 5 per day. Recommend she take it prn.  - Follow up with me in 4-5 weeks for recheck. Please come 1 hour early to get repeat xrays at [place].  I spent a total of *** minutes in face-to-face and non-face-to-face activities related to this patient's care today including review of outside records, review of imaging, review of symptoms, physical exam, discussion of differential diagnosis, discussion of treatment options, and documentation.  Geronimo Boot PA-C Dept. of Neurosurgery

## 2022-11-27 ENCOUNTER — Encounter: Payer: Self-pay | Admitting: Orthopedic Surgery

## 2022-11-27 ENCOUNTER — Ambulatory Visit
Admission: RE | Admit: 2022-11-27 | Discharge: 2022-11-27 | Disposition: A | Discharge status: Home / Self Care | Payer: Medicare Other | Source: Ambulatory Visit | Attending: Orthopedic Surgery | Admitting: Orthopedic Surgery

## 2022-11-27 ENCOUNTER — Ambulatory Visit
Admission: RE | Admit: 2022-11-27 | Discharge: 2022-11-27 | Disposition: A | Discharge status: Home / Self Care | Payer: Medicare Other | Attending: Orthopedic Surgery | Admitting: Orthopedic Surgery

## 2022-11-27 ENCOUNTER — Ambulatory Visit (INDEPENDENT_AMBULATORY_CARE_PROVIDER_SITE_OTHER): Payer: Medicare Other | Admitting: Orthopedic Surgery

## 2022-11-27 VITALS — BP 126/82 | Ht 63.0 in | Wt 240.0 lb

## 2022-11-27 DIAGNOSIS — F0393 Unspecified dementia, unspecified severity, with mood disturbance: Secondary | ICD-10-CM | POA: Diagnosis not present

## 2022-11-27 DIAGNOSIS — S32040D Wedge compression fracture of fourth lumbar vertebra, subsequent encounter for fracture with routine healing: Secondary | ICD-10-CM

## 2022-11-27 DIAGNOSIS — S32030D Wedge compression fracture of third lumbar vertebra, subsequent encounter for fracture with routine healing: Secondary | ICD-10-CM | POA: Insufficient documentation

## 2022-11-27 DIAGNOSIS — F339 Major depressive disorder, recurrent, unspecified: Secondary | ICD-10-CM | POA: Diagnosis not present

## 2022-11-27 DIAGNOSIS — G43709 Chronic migraine without aura, not intractable, without status migrainosus: Secondary | ICD-10-CM | POA: Diagnosis not present

## 2022-11-27 DIAGNOSIS — S32050D Wedge compression fracture of fifth lumbar vertebra, subsequent encounter for fracture with routine healing: Secondary | ICD-10-CM | POA: Diagnosis not present

## 2022-11-27 DIAGNOSIS — G894 Chronic pain syndrome: Secondary | ICD-10-CM | POA: Diagnosis not present

## 2022-11-27 DIAGNOSIS — M545 Low back pain, unspecified: Secondary | ICD-10-CM | POA: Diagnosis not present

## 2022-11-27 DIAGNOSIS — G603 Idiopathic progressive neuropathy: Secondary | ICD-10-CM | POA: Diagnosis not present

## 2022-11-27 DIAGNOSIS — S32010A Wedge compression fracture of first lumbar vertebra, initial encounter for closed fracture: Secondary | ICD-10-CM | POA: Diagnosis not present

## 2022-11-27 NOTE — Addendum Note (Signed)
Addended byGeronimo Boot on: 11/27/2022 11:53 AM   Modules accepted: Orders

## 2022-11-27 NOTE — Patient Instructions (Signed)
It was so nice to see you today. Thank you so much for coming in.    I don't see any new fractures on your lower back xrays.   I want you to follow up with Dr. Nelva Bush to discuss possible lumbar injections to help with your back and leg pain.   You don't need to wear your brace.   You can restart physical therapy from my standpoint.   I will see you back as needed. Please do not hesitate to call if you have any questions or concerns.    Geronimo Boot PA-C [phone]

## 2022-11-29 DIAGNOSIS — F339 Major depressive disorder, recurrent, unspecified: Secondary | ICD-10-CM | POA: Diagnosis not present

## 2022-11-29 DIAGNOSIS — G894 Chronic pain syndrome: Secondary | ICD-10-CM | POA: Diagnosis not present

## 2022-11-29 DIAGNOSIS — M545 Low back pain, unspecified: Secondary | ICD-10-CM | POA: Diagnosis not present

## 2022-11-29 DIAGNOSIS — G43709 Chronic migraine without aura, not intractable, without status migrainosus: Secondary | ICD-10-CM | POA: Diagnosis not present

## 2022-11-29 DIAGNOSIS — G603 Idiopathic progressive neuropathy: Secondary | ICD-10-CM | POA: Diagnosis not present

## 2022-11-29 DIAGNOSIS — F0393 Unspecified dementia, unspecified severity, with mood disturbance: Secondary | ICD-10-CM | POA: Diagnosis not present

## 2022-12-04 DIAGNOSIS — F0393 Unspecified dementia, unspecified severity, with mood disturbance: Secondary | ICD-10-CM | POA: Diagnosis not present

## 2022-12-04 DIAGNOSIS — F339 Major depressive disorder, recurrent, unspecified: Secondary | ICD-10-CM | POA: Diagnosis not present

## 2022-12-04 DIAGNOSIS — G894 Chronic pain syndrome: Secondary | ICD-10-CM | POA: Diagnosis not present

## 2022-12-04 DIAGNOSIS — G43709 Chronic migraine without aura, not intractable, without status migrainosus: Secondary | ICD-10-CM | POA: Diagnosis not present

## 2022-12-04 DIAGNOSIS — M545 Low back pain, unspecified: Secondary | ICD-10-CM | POA: Diagnosis not present

## 2022-12-04 DIAGNOSIS — G603 Idiopathic progressive neuropathy: Secondary | ICD-10-CM | POA: Diagnosis not present

## 2022-12-06 ENCOUNTER — Telehealth: Payer: Self-pay | Admitting: Neurology

## 2022-12-06 DIAGNOSIS — G894 Chronic pain syndrome: Secondary | ICD-10-CM | POA: Diagnosis not present

## 2022-12-06 DIAGNOSIS — G603 Idiopathic progressive neuropathy: Secondary | ICD-10-CM | POA: Diagnosis not present

## 2022-12-06 DIAGNOSIS — M545 Low back pain, unspecified: Secondary | ICD-10-CM | POA: Diagnosis not present

## 2022-12-06 DIAGNOSIS — F339 Major depressive disorder, recurrent, unspecified: Secondary | ICD-10-CM | POA: Diagnosis not present

## 2022-12-06 DIAGNOSIS — G43709 Chronic migraine without aura, not intractable, without status migrainosus: Secondary | ICD-10-CM | POA: Diagnosis not present

## 2022-12-06 DIAGNOSIS — F0393 Unspecified dementia, unspecified severity, with mood disturbance: Secondary | ICD-10-CM | POA: Diagnosis not present

## 2022-12-06 NOTE — Telephone Encounter (Signed)
Pt is requesting a refill for pregabalin (LYRICA) 150 MG capsule .  Pharmacy: [facility]

## 2022-12-07 ENCOUNTER — Ambulatory Visit: Payer: Medicare Other | Admitting: Neurology

## 2022-12-08 DIAGNOSIS — M19072 Primary osteoarthritis, left ankle and foot: Secondary | ICD-10-CM | POA: Diagnosis not present

## 2022-12-08 DIAGNOSIS — M67962 Unspecified disorder of synovium and tendon, left lower leg: Secondary | ICD-10-CM | POA: Diagnosis not present

## 2022-12-08 DIAGNOSIS — M25572 Pain in left ankle and joints of left foot: Secondary | ICD-10-CM | POA: Diagnosis not present

## 2022-12-11 DIAGNOSIS — G43709 Chronic migraine without aura, not intractable, without status migrainosus: Secondary | ICD-10-CM | POA: Diagnosis not present

## 2022-12-11 DIAGNOSIS — F0393 Unspecified dementia, unspecified severity, with mood disturbance: Secondary | ICD-10-CM | POA: Diagnosis not present

## 2022-12-11 DIAGNOSIS — G894 Chronic pain syndrome: Secondary | ICD-10-CM | POA: Diagnosis not present

## 2022-12-11 DIAGNOSIS — F339 Major depressive disorder, recurrent, unspecified: Secondary | ICD-10-CM | POA: Diagnosis not present

## 2022-12-11 DIAGNOSIS — G603 Idiopathic progressive neuropathy: Secondary | ICD-10-CM | POA: Diagnosis not present

## 2022-12-11 DIAGNOSIS — M545 Low back pain, unspecified: Secondary | ICD-10-CM | POA: Diagnosis not present

## 2022-12-13 DIAGNOSIS — R2689 Other abnormalities of gait and mobility: Secondary | ICD-10-CM | POA: Diagnosis not present

## 2022-12-13 DIAGNOSIS — G603 Idiopathic progressive neuropathy: Secondary | ICD-10-CM | POA: Diagnosis not present

## 2022-12-13 DIAGNOSIS — G43709 Chronic migraine without aura, not intractable, without status migrainosus: Secondary | ICD-10-CM | POA: Diagnosis not present

## 2022-12-13 DIAGNOSIS — E039 Hypothyroidism, unspecified: Secondary | ICD-10-CM | POA: Diagnosis not present

## 2022-12-13 DIAGNOSIS — M545 Low back pain, unspecified: Secondary | ICD-10-CM | POA: Diagnosis not present

## 2022-12-13 DIAGNOSIS — E559 Vitamin D deficiency, unspecified: Secondary | ICD-10-CM | POA: Diagnosis not present

## 2022-12-13 DIAGNOSIS — K589 Irritable bowel syndrome without diarrhea: Secondary | ICD-10-CM | POA: Diagnosis not present

## 2022-12-13 DIAGNOSIS — G894 Chronic pain syndrome: Secondary | ICD-10-CM | POA: Diagnosis not present

## 2022-12-13 DIAGNOSIS — F339 Major depressive disorder, recurrent, unspecified: Secondary | ICD-10-CM | POA: Diagnosis not present

## 2022-12-13 DIAGNOSIS — F0393 Unspecified dementia, unspecified severity, with mood disturbance: Secondary | ICD-10-CM | POA: Diagnosis not present

## 2022-12-16 DIAGNOSIS — G43709 Chronic migraine without aura, not intractable, without status migrainosus: Secondary | ICD-10-CM | POA: Diagnosis not present

## 2022-12-16 DIAGNOSIS — M545 Low back pain, unspecified: Secondary | ICD-10-CM | POA: Diagnosis not present

## 2022-12-16 DIAGNOSIS — F0393 Unspecified dementia, unspecified severity, with mood disturbance: Secondary | ICD-10-CM | POA: Diagnosis not present

## 2022-12-16 DIAGNOSIS — G603 Idiopathic progressive neuropathy: Secondary | ICD-10-CM | POA: Diagnosis not present

## 2022-12-16 DIAGNOSIS — G894 Chronic pain syndrome: Secondary | ICD-10-CM | POA: Diagnosis not present

## 2022-12-16 DIAGNOSIS — F339 Major depressive disorder, recurrent, unspecified: Secondary | ICD-10-CM | POA: Diagnosis not present

## 2022-12-18 DIAGNOSIS — M545 Low back pain, unspecified: Secondary | ICD-10-CM | POA: Diagnosis not present

## 2022-12-18 DIAGNOSIS — F339 Major depressive disorder, recurrent, unspecified: Secondary | ICD-10-CM | POA: Diagnosis not present

## 2022-12-18 DIAGNOSIS — G894 Chronic pain syndrome: Secondary | ICD-10-CM | POA: Diagnosis not present

## 2022-12-18 DIAGNOSIS — F0393 Unspecified dementia, unspecified severity, with mood disturbance: Secondary | ICD-10-CM | POA: Diagnosis not present

## 2022-12-18 DIAGNOSIS — G603 Idiopathic progressive neuropathy: Secondary | ICD-10-CM | POA: Diagnosis not present

## 2022-12-18 DIAGNOSIS — G43709 Chronic migraine without aura, not intractable, without status migrainosus: Secondary | ICD-10-CM | POA: Diagnosis not present

## 2022-12-19 DIAGNOSIS — M545 Low back pain, unspecified: Secondary | ICD-10-CM | POA: Diagnosis not present

## 2022-12-19 DIAGNOSIS — F0393 Unspecified dementia, unspecified severity, with mood disturbance: Secondary | ICD-10-CM | POA: Diagnosis not present

## 2022-12-19 DIAGNOSIS — G603 Idiopathic progressive neuropathy: Secondary | ICD-10-CM | POA: Diagnosis not present

## 2022-12-19 DIAGNOSIS — G894 Chronic pain syndrome: Secondary | ICD-10-CM | POA: Diagnosis not present

## 2022-12-19 DIAGNOSIS — F339 Major depressive disorder, recurrent, unspecified: Secondary | ICD-10-CM | POA: Diagnosis not present

## 2022-12-19 DIAGNOSIS — G43709 Chronic migraine without aura, not intractable, without status migrainosus: Secondary | ICD-10-CM | POA: Diagnosis not present

## 2022-12-20 DIAGNOSIS — G894 Chronic pain syndrome: Secondary | ICD-10-CM | POA: Diagnosis not present

## 2022-12-20 DIAGNOSIS — F5104 Psychophysiologic insomnia: Secondary | ICD-10-CM | POA: Diagnosis not present

## 2022-12-20 DIAGNOSIS — G603 Idiopathic progressive neuropathy: Secondary | ICD-10-CM | POA: Diagnosis not present

## 2022-12-20 DIAGNOSIS — F02A18 Dementia in other diseases classified elsewhere, mild, with other behavioral disturbance: Secondary | ICD-10-CM | POA: Diagnosis not present

## 2022-12-20 DIAGNOSIS — F331 Major depressive disorder, recurrent, moderate: Secondary | ICD-10-CM | POA: Diagnosis not present

## 2022-12-20 DIAGNOSIS — G301 Alzheimer's disease with late onset: Secondary | ICD-10-CM | POA: Diagnosis not present

## 2022-12-20 DIAGNOSIS — F339 Major depressive disorder, recurrent, unspecified: Secondary | ICD-10-CM | POA: Diagnosis not present

## 2022-12-20 DIAGNOSIS — G43709 Chronic migraine without aura, not intractable, without status migrainosus: Secondary | ICD-10-CM | POA: Diagnosis not present

## 2022-12-20 DIAGNOSIS — F411 Generalized anxiety disorder: Secondary | ICD-10-CM | POA: Diagnosis not present

## 2022-12-20 DIAGNOSIS — F0393 Unspecified dementia, unspecified severity, with mood disturbance: Secondary | ICD-10-CM | POA: Diagnosis not present

## 2022-12-20 DIAGNOSIS — M545 Low back pain, unspecified: Secondary | ICD-10-CM | POA: Diagnosis not present

## 2022-12-24 DIAGNOSIS — G894 Chronic pain syndrome: Secondary | ICD-10-CM | POA: Diagnosis not present

## 2022-12-24 DIAGNOSIS — K589 Irritable bowel syndrome without diarrhea: Secondary | ICD-10-CM | POA: Diagnosis not present

## 2022-12-24 DIAGNOSIS — F0393 Unspecified dementia, unspecified severity, with mood disturbance: Secondary | ICD-10-CM | POA: Diagnosis not present

## 2022-12-24 DIAGNOSIS — E559 Vitamin D deficiency, unspecified: Secondary | ICD-10-CM | POA: Diagnosis not present

## 2022-12-24 DIAGNOSIS — E039 Hypothyroidism, unspecified: Secondary | ICD-10-CM | POA: Diagnosis not present

## 2022-12-24 DIAGNOSIS — M545 Low back pain, unspecified: Secondary | ICD-10-CM | POA: Diagnosis not present

## 2022-12-24 DIAGNOSIS — Z9181 History of falling: Secondary | ICD-10-CM | POA: Diagnosis not present

## 2022-12-24 DIAGNOSIS — G603 Idiopathic progressive neuropathy: Secondary | ICD-10-CM | POA: Diagnosis not present

## 2022-12-24 DIAGNOSIS — G4700 Insomnia, unspecified: Secondary | ICD-10-CM | POA: Diagnosis not present

## 2022-12-24 DIAGNOSIS — G43709 Chronic migraine without aura, not intractable, without status migrainosus: Secondary | ICD-10-CM | POA: Diagnosis not present

## 2022-12-24 DIAGNOSIS — F339 Major depressive disorder, recurrent, unspecified: Secondary | ICD-10-CM | POA: Diagnosis not present

## 2022-12-25 DIAGNOSIS — M545 Low back pain, unspecified: Secondary | ICD-10-CM | POA: Diagnosis not present

## 2022-12-25 DIAGNOSIS — G894 Chronic pain syndrome: Secondary | ICD-10-CM | POA: Diagnosis not present

## 2022-12-25 DIAGNOSIS — F0393 Unspecified dementia, unspecified severity, with mood disturbance: Secondary | ICD-10-CM | POA: Diagnosis not present

## 2022-12-25 DIAGNOSIS — F339 Major depressive disorder, recurrent, unspecified: Secondary | ICD-10-CM | POA: Diagnosis not present

## 2022-12-25 DIAGNOSIS — G603 Idiopathic progressive neuropathy: Secondary | ICD-10-CM | POA: Diagnosis not present

## 2022-12-25 DIAGNOSIS — G43709 Chronic migraine without aura, not intractable, without status migrainosus: Secondary | ICD-10-CM | POA: Diagnosis not present

## 2022-12-27 DIAGNOSIS — R2689 Other abnormalities of gait and mobility: Secondary | ICD-10-CM | POA: Diagnosis not present

## 2022-12-27 DIAGNOSIS — N39498 Other specified urinary incontinence: Secondary | ICD-10-CM | POA: Diagnosis not present

## 2022-12-27 DIAGNOSIS — G603 Idiopathic progressive neuropathy: Secondary | ICD-10-CM | POA: Diagnosis not present

## 2022-12-27 DIAGNOSIS — K589 Irritable bowel syndrome without diarrhea: Secondary | ICD-10-CM | POA: Diagnosis not present

## 2023-01-02 DIAGNOSIS — F339 Major depressive disorder, recurrent, unspecified: Secondary | ICD-10-CM | POA: Diagnosis not present

## 2023-01-02 DIAGNOSIS — G894 Chronic pain syndrome: Secondary | ICD-10-CM | POA: Diagnosis not present

## 2023-01-02 DIAGNOSIS — G43709 Chronic migraine without aura, not intractable, without status migrainosus: Secondary | ICD-10-CM | POA: Diagnosis not present

## 2023-01-02 DIAGNOSIS — M545 Low back pain, unspecified: Secondary | ICD-10-CM | POA: Diagnosis not present

## 2023-01-02 DIAGNOSIS — G603 Idiopathic progressive neuropathy: Secondary | ICD-10-CM | POA: Diagnosis not present

## 2023-01-02 DIAGNOSIS — F331 Major depressive disorder, recurrent, moderate: Secondary | ICD-10-CM | POA: Diagnosis not present

## 2023-01-02 DIAGNOSIS — F0393 Unspecified dementia, unspecified severity, with mood disturbance: Secondary | ICD-10-CM | POA: Diagnosis not present

## 2023-01-03 DIAGNOSIS — G603 Idiopathic progressive neuropathy: Secondary | ICD-10-CM | POA: Diagnosis not present

## 2023-01-03 DIAGNOSIS — K589 Irritable bowel syndrome without diarrhea: Secondary | ICD-10-CM | POA: Diagnosis not present

## 2023-01-03 DIAGNOSIS — E039 Hypothyroidism, unspecified: Secondary | ICD-10-CM | POA: Diagnosis not present

## 2023-01-03 DIAGNOSIS — R2689 Other abnormalities of gait and mobility: Secondary | ICD-10-CM | POA: Diagnosis not present

## 2023-01-03 DIAGNOSIS — G43709 Chronic migraine without aura, not intractable, without status migrainosus: Secondary | ICD-10-CM | POA: Diagnosis not present

## 2023-01-03 DIAGNOSIS — N39498 Other specified urinary incontinence: Secondary | ICD-10-CM | POA: Diagnosis not present

## 2023-01-03 DIAGNOSIS — F0393 Unspecified dementia, unspecified severity, with mood disturbance: Secondary | ICD-10-CM | POA: Diagnosis not present

## 2023-01-03 DIAGNOSIS — M545 Low back pain, unspecified: Secondary | ICD-10-CM | POA: Diagnosis not present

## 2023-01-03 DIAGNOSIS — E559 Vitamin D deficiency, unspecified: Secondary | ICD-10-CM | POA: Diagnosis not present

## 2023-01-03 DIAGNOSIS — G894 Chronic pain syndrome: Secondary | ICD-10-CM | POA: Diagnosis not present

## 2023-01-03 DIAGNOSIS — F339 Major depressive disorder, recurrent, unspecified: Secondary | ICD-10-CM | POA: Diagnosis not present

## 2023-01-05 DIAGNOSIS — F32A Depression, unspecified: Secondary | ICD-10-CM | POA: Diagnosis not present

## 2023-01-08 DIAGNOSIS — G894 Chronic pain syndrome: Secondary | ICD-10-CM | POA: Diagnosis not present

## 2023-01-08 DIAGNOSIS — F0393 Unspecified dementia, unspecified severity, with mood disturbance: Secondary | ICD-10-CM | POA: Diagnosis not present

## 2023-01-08 DIAGNOSIS — G603 Idiopathic progressive neuropathy: Secondary | ICD-10-CM | POA: Diagnosis not present

## 2023-01-08 DIAGNOSIS — G43709 Chronic migraine without aura, not intractable, without status migrainosus: Secondary | ICD-10-CM | POA: Diagnosis not present

## 2023-01-08 DIAGNOSIS — M545 Low back pain, unspecified: Secondary | ICD-10-CM | POA: Diagnosis not present

## 2023-01-08 DIAGNOSIS — F339 Major depressive disorder, recurrent, unspecified: Secondary | ICD-10-CM | POA: Diagnosis not present

## 2023-01-12 DIAGNOSIS — G603 Idiopathic progressive neuropathy: Secondary | ICD-10-CM | POA: Diagnosis not present

## 2023-01-12 DIAGNOSIS — G894 Chronic pain syndrome: Secondary | ICD-10-CM | POA: Diagnosis not present

## 2023-01-12 DIAGNOSIS — M545 Low back pain, unspecified: Secondary | ICD-10-CM | POA: Diagnosis not present

## 2023-01-15 DIAGNOSIS — F331 Major depressive disorder, recurrent, moderate: Secondary | ICD-10-CM | POA: Diagnosis not present

## 2023-01-17 ENCOUNTER — Telehealth: Payer: Self-pay | Admitting: Neurology

## 2023-01-17 DIAGNOSIS — N393 Stress incontinence (female) (male): Secondary | ICD-10-CM | POA: Diagnosis not present

## 2023-01-17 DIAGNOSIS — N3941 Urge incontinence: Secondary | ICD-10-CM | POA: Diagnosis not present

## 2023-01-17 DIAGNOSIS — F32A Depression, unspecified: Secondary | ICD-10-CM | POA: Diagnosis not present

## 2023-01-17 DIAGNOSIS — R296 Repeated falls: Secondary | ICD-10-CM | POA: Diagnosis not present

## 2023-01-17 DIAGNOSIS — R2689 Other abnormalities of gait and mobility: Secondary | ICD-10-CM | POA: Diagnosis not present

## 2023-01-17 NOTE — Telephone Encounter (Signed)
LVM and sent mychart msg informing pt of need to reschedule 03/29/23 appt - MD out

## 2023-01-19 DIAGNOSIS — G894 Chronic pain syndrome: Secondary | ICD-10-CM | POA: Diagnosis not present

## 2023-01-19 DIAGNOSIS — F0393 Unspecified dementia, unspecified severity, with mood disturbance: Secondary | ICD-10-CM | POA: Diagnosis not present

## 2023-01-19 DIAGNOSIS — M545 Low back pain, unspecified: Secondary | ICD-10-CM | POA: Diagnosis not present

## 2023-01-19 DIAGNOSIS — G43709 Chronic migraine without aura, not intractable, without status migrainosus: Secondary | ICD-10-CM | POA: Diagnosis not present

## 2023-01-19 DIAGNOSIS — G603 Idiopathic progressive neuropathy: Secondary | ICD-10-CM | POA: Diagnosis not present

## 2023-01-19 DIAGNOSIS — F339 Major depressive disorder, recurrent, unspecified: Secondary | ICD-10-CM | POA: Diagnosis not present

## 2023-01-22 DIAGNOSIS — F29 Unspecified psychosis not due to a substance or known physiological condition: Secondary | ICD-10-CM | POA: Diagnosis not present

## 2023-01-22 DIAGNOSIS — G4700 Insomnia, unspecified: Secondary | ICD-10-CM | POA: Insufficient documentation

## 2023-01-22 DIAGNOSIS — Z0000 Encounter for general adult medical examination without abnormal findings: Secondary | ICD-10-CM | POA: Insufficient documentation

## 2023-01-22 DIAGNOSIS — F99 Mental disorder, not otherwise specified: Secondary | ICD-10-CM | POA: Insufficient documentation

## 2023-01-22 DIAGNOSIS — R45851 Suicidal ideations: Secondary | ICD-10-CM | POA: Diagnosis not present

## 2023-01-22 DIAGNOSIS — Z79899 Other long term (current) drug therapy: Secondary | ICD-10-CM | POA: Diagnosis not present

## 2023-01-22 LAB — BASIC METABOLIC PANEL
Anion gap: 10 (ref 5–15)
BUN: 18 mg/dL (ref 8–23)
CO2: 26 mmol/L (ref 22–32)
Calcium: 8.4 mg/dL — ABNORMAL LOW (ref 8.9–10.3)
Chloride: 95 mmol/L — ABNORMAL LOW (ref 98–111)
Creatinine, Ser: 0.86 mg/dL (ref 0.44–1.00)
GFR, Estimated: >60 mL/min (ref >60)
Glucose, Bld: 110 mg/dL — ABNORMAL HIGH (ref 70–99)
Potassium: 3.9 mmol/L (ref 3.5–5.1)
Sodium: 131 mmol/L — ABNORMAL LOW (ref 135–145)

## 2023-01-22 LAB — CBC
HCT: 39.1 % (ref 36.0–46.0)
Hemoglobin: 12.3 g/dL (ref 12.0–15.0)
MCH: 25.8 pg — ABNORMAL LOW (ref 26.0–34.0)
MCHC: 31.5 g/dL (ref 30.0–36.0)
MCV: 82.1 fL (ref 80.0–100.0)
Platelets: 156 10*3/uL (ref 150–400)
RBC: 4.76 MIL/uL (ref 3.87–5.11)
RDW: 16.3 % — ABNORMAL HIGH (ref 11.5–15.5)
WBC: 6.2 10*3/uL (ref 4.0–10.5)
nRBC: 0 % (ref 0.0–0.2)

## 2023-01-22 LAB — SALICYLATE LEVEL: Salicylate Lvl: <7 mg/dL — ABNORMAL LOW (ref 7.0–30.0)

## 2023-01-22 LAB — ACETAMINOPHEN LEVEL: Acetaminophen (Tylenol), Serum: <10 ug/mL — ABNORMAL LOW (ref 10–30)

## 2023-01-22 LAB — ETHANOL: Alcohol, Ethyl (B): <10 mg/dL (ref <10)

## 2023-01-22 NOTE — ED Triage Notes (Signed)
Pt sts that she is a new resident at [facility]. Pt sts that since moving there she has not been able to get a good night rest and wanted to talk to her therapist. Pt was able to get ahold of her therapist and they will prescribe something. Pt sts that staff walked into her room and pt eluded to the fact that she was going to kill herself due to not being able to get sleep. Pt sts that they took it very seriously. Pt denies any SI or HI or even any complaints at this time.

## 2023-01-22 NOTE — ED Notes (Signed)
Pt adamant in triage that she did not make statements that she wanted to harm herself. States " I said that the physician there wouldn't pay attention if we committed suicide." Pt states she has chronic trouble sleeping and despite psychiatrist changing medication dosages to help with insomnia, that the facility MD will not approve the medication changes so she never receives the recommended medications and she became upset earlier this evening.

## 2023-01-22 NOTE — ED Notes (Addendum)
Pt dressed out into [place] [facility] scrubs with this nurse and [place], EDT present in room. Pt items placed into hospital belongings bag and labeled with pt demographics. Items include 1 pair shoes 1 pair black pants 1 shirt 2 necklaces 6 rings 1 pair earrings

## 2023-01-23 ENCOUNTER — Emergency Department
Admission: EM | Admit: 2023-01-23 | Discharge: 2023-01-23 | Disposition: A | Discharge status: Home / Self Care | Payer: TRICARE For Life (TFL) | Attending: Emergency Medicine | Admitting: Emergency Medicine

## 2023-01-23 DIAGNOSIS — F29 Unspecified psychosis not due to a substance or known physiological condition: Secondary | ICD-10-CM | POA: Diagnosis not present

## 2023-01-23 DIAGNOSIS — G4700 Insomnia, unspecified: Secondary | ICD-10-CM | POA: Diagnosis not present

## 2023-01-23 DIAGNOSIS — F0393 Unspecified dementia, unspecified severity, with mood disturbance: Secondary | ICD-10-CM | POA: Diagnosis not present

## 2023-01-23 DIAGNOSIS — G894 Chronic pain syndrome: Secondary | ICD-10-CM | POA: Diagnosis not present

## 2023-01-23 DIAGNOSIS — E039 Hypothyroidism, unspecified: Secondary | ICD-10-CM | POA: Diagnosis not present

## 2023-01-23 DIAGNOSIS — G603 Idiopathic progressive neuropathy: Secondary | ICD-10-CM | POA: Diagnosis not present

## 2023-01-23 DIAGNOSIS — F339 Major depressive disorder, recurrent, unspecified: Secondary | ICD-10-CM | POA: Diagnosis not present

## 2023-01-23 DIAGNOSIS — M545 Low back pain, unspecified: Secondary | ICD-10-CM | POA: Diagnosis not present

## 2023-01-23 DIAGNOSIS — Z9181 History of falling: Secondary | ICD-10-CM | POA: Diagnosis not present

## 2023-01-23 DIAGNOSIS — E559 Vitamin D deficiency, unspecified: Secondary | ICD-10-CM | POA: Diagnosis not present

## 2023-01-23 DIAGNOSIS — Z046 Encounter for general psychiatric examination, requested by authority: Secondary | ICD-10-CM | POA: Diagnosis not present

## 2023-01-23 DIAGNOSIS — G43709 Chronic migraine without aura, not intractable, without status migrainosus: Secondary | ICD-10-CM | POA: Diagnosis not present

## 2023-01-23 DIAGNOSIS — Z139 Encounter for screening, unspecified: Secondary | ICD-10-CM

## 2023-01-23 DIAGNOSIS — K589 Irritable bowel syndrome without diarrhea: Secondary | ICD-10-CM | POA: Diagnosis not present

## 2023-01-23 NOTE — ED Provider Notes (Signed)
[facility] Provider Note    Event Date/Time   First MD Initiated Contact with Patient 01/23/23 0036     (approximate)   History   Mental Health Problem   HPI  [name] is a 77 y.o. female who presents to the ED for evaluation of Mental Health Problem   Patient presents to the ED from her local SNF for evaluation of her mental health and apparent suicidal claims.  Patient reports that she has been at [facility] for the past 5 or 6 months and acknowledges that it is unpleasant but sufficient.  She reports frustration with the prescribing provider at the facility, often having difficulty getting her typical medications prescribed and provided to her at this facility.  She further reports that she has had difficulty sleeping and insomnia on a subacute or chronic timeframe.  She saw her typical psychiatrist for this, and reports that she had not had any sleep for the past few nights and so the psychiatrist wrote a new prescription and was in a call the facility provider to make sure it was provided to the patient.  At her facility after this psychiatric visit, patient reports lamenting to the staff that she will probably not get this medication because of the barriers that she feels the provider at the facility puts up.  She reports sarcastically saying something along the lines of "I am more likely to kill myself than get the medication."  She reports staff heard this, escalated to the manager and police was called and she was brought to the ED.  She reports that she has 0 desire to harm herself and has not even considered this or thought of this.  She reports strong social supports with her   Physical Exam   Triage Vital Signs: ED Triage Vitals [01/22/23 1822]  Enc Vitals Group     BP 135/77     Pulse Rate 65     Resp 17     Temp 98.4 F (36.9 C)     Temp Source Oral     SpO2 98 %     Weight 225 lb (102.1 kg)     Height      Head Circumference       Peak Flow      Pain Score 0     Pain Loc      Pain Edu?      Excl. in GC?     Most recent vital signs: Vitals:   01/22/23 1822 01/23/23 0235  BP: 135/77 123/69  Pulse: 65 67  Resp: 17 15  Temp: 98.4 F (36.9 C) 98 F (36.7 C)  SpO2: 98% 97%    General: Awake, no distress.  Pleasant and conversational.  Appropriate affect, linear thoughts.  Laughing appropriately at jokes. CV:  Good peripheral perfusion.  Resp:  Normal effort.  Abd:  No distention.  MSK:  No deformity noted.  Neuro:  No focal deficits appreciated. Other:     ED Results / Procedures / Treatments   Labs (all labs ordered are listed, but only abnormal results are displayed) Labs Reviewed  CBC - Abnormal; Notable for the following components:      Result Value   MCH 25.8 (*)    RDW 16.3 (*)    All other components within normal limits  BASIC METABOLIC PANEL - Abnormal; Notable for the following components:   Sodium 131 (*)    Chloride 95 (*)    Glucose, Bld  110 (*)    Calcium 8.4 (*)    All other components within normal limits  SALICYLATE LEVEL - Abnormal; Notable for the following components:   Salicylate Lvl <7.0 (*)    All other components within normal limits  ACETAMINOPHEN LEVEL - Abnormal; Notable for the following components:   Acetaminophen (Tylenol), Serum <10 (*)    All other components within normal limits  ETHANOL  URINE DRUG SCREEN, QUALITATIVE ([facility] ONLY)  URINALYSIS, ROUTINE W REFLEX MICROSCOPIC    EKG   RADIOLOGY   Official radiology report(s): No results found.  PROCEDURES and INTERVENTIONS:  Procedures  Medications - No data to display   IMPRESSION / MDM / ASSESSMENT AND PLAN / ED COURSE  I reviewed the triage vital signs and the nursing notes.  Differential diagnosis includes, but is not limited to, suicidal, polysubstance abuse, no pathology  Patient presents for evaluation of her mental health after making a joke at her facility regarding suicide.  I  have no acute medical or psychiatric concerns about this patient.  She looks well and is acting quite appropriately, is well supported socially by her son, connected with a psychiatrist and in a monitored facility.  She is frustrated that she was even sent to the ER, which seems fair after I have a discussion with her.  She is suitable for continued outpatient management.         FINAL CLINICAL IMPRESSION(S) / ED DIAGNOSES   Final diagnoses:  Encounter for medical screening examination     Rx / DC Orders   ED Discharge Orders     None        Note:  This document was prepared using Dragon voice recognition software and may include unintentional dictation errors.   Delton Prairie, MD 01/23/23 [phone]

## 2023-01-25 DIAGNOSIS — G43709 Chronic migraine without aura, not intractable, without status migrainosus: Secondary | ICD-10-CM | POA: Diagnosis not present

## 2023-01-25 DIAGNOSIS — F0393 Unspecified dementia, unspecified severity, with mood disturbance: Secondary | ICD-10-CM | POA: Diagnosis not present

## 2023-01-25 DIAGNOSIS — M545 Low back pain, unspecified: Secondary | ICD-10-CM | POA: Diagnosis not present

## 2023-01-25 DIAGNOSIS — G894 Chronic pain syndrome: Secondary | ICD-10-CM | POA: Diagnosis not present

## 2023-01-25 DIAGNOSIS — G603 Idiopathic progressive neuropathy: Secondary | ICD-10-CM | POA: Diagnosis not present

## 2023-01-25 DIAGNOSIS — F339 Major depressive disorder, recurrent, unspecified: Secondary | ICD-10-CM | POA: Diagnosis not present

## 2023-01-31 DIAGNOSIS — E559 Vitamin D deficiency, unspecified: Secondary | ICD-10-CM | POA: Diagnosis not present

## 2023-01-31 DIAGNOSIS — E039 Hypothyroidism, unspecified: Secondary | ICD-10-CM | POA: Diagnosis not present

## 2023-01-31 DIAGNOSIS — N393 Stress incontinence (female) (male): Secondary | ICD-10-CM | POA: Diagnosis not present

## 2023-01-31 DIAGNOSIS — R2689 Other abnormalities of gait and mobility: Secondary | ICD-10-CM | POA: Diagnosis not present

## 2023-01-31 DIAGNOSIS — G603 Idiopathic progressive neuropathy: Secondary | ICD-10-CM | POA: Diagnosis not present

## 2023-01-31 DIAGNOSIS — G43709 Chronic migraine without aura, not intractable, without status migrainosus: Secondary | ICD-10-CM | POA: Diagnosis not present

## 2023-01-31 DIAGNOSIS — N3941 Urge incontinence: Secondary | ICD-10-CM | POA: Diagnosis not present

## 2023-01-31 DIAGNOSIS — M545 Low back pain, unspecified: Secondary | ICD-10-CM | POA: Diagnosis not present

## 2023-01-31 DIAGNOSIS — R296 Repeated falls: Secondary | ICD-10-CM | POA: Diagnosis not present

## 2023-02-01 ENCOUNTER — Emergency Department: Payer: Medicare Other

## 2023-02-01 ENCOUNTER — Other Ambulatory Visit: Payer: Self-pay

## 2023-02-01 ENCOUNTER — Emergency Department
Admission: EM | Admit: 2023-02-01 | Discharge: 2023-02-01 | Disposition: A | Discharge status: Home / Self Care | Payer: Medicare Other | Attending: Emergency Medicine | Admitting: Emergency Medicine

## 2023-02-01 DIAGNOSIS — M25572 Pain in left ankle and joints of left foot: Secondary | ICD-10-CM | POA: Diagnosis not present

## 2023-02-01 DIAGNOSIS — Z7982 Long term (current) use of aspirin: Secondary | ICD-10-CM | POA: Insufficient documentation

## 2023-02-01 DIAGNOSIS — M25552 Pain in left hip: Secondary | ICD-10-CM | POA: Diagnosis not present

## 2023-02-01 DIAGNOSIS — K219 Gastro-esophageal reflux disease without esophagitis: Secondary | ICD-10-CM | POA: Insufficient documentation

## 2023-02-01 DIAGNOSIS — W010XXA Fall on same level from slipping, tripping and stumbling without subsequent striking against object, initial encounter: Secondary | ICD-10-CM | POA: Insufficient documentation

## 2023-02-01 DIAGNOSIS — M25562 Pain in left knee: Secondary | ICD-10-CM | POA: Diagnosis not present

## 2023-02-01 DIAGNOSIS — M25551 Pain in right hip: Secondary | ICD-10-CM | POA: Diagnosis not present

## 2023-02-01 DIAGNOSIS — W19XXXA Unspecified fall, initial encounter: Secondary | ICD-10-CM | POA: Diagnosis not present

## 2023-02-01 DIAGNOSIS — S79912A Unspecified injury of left hip, initial encounter: Secondary | ICD-10-CM | POA: Diagnosis present

## 2023-02-01 DIAGNOSIS — S8002XA Contusion of left knee, initial encounter: Secondary | ICD-10-CM | POA: Insufficient documentation

## 2023-02-01 DIAGNOSIS — R0902 Hypoxemia: Secondary | ICD-10-CM | POA: Diagnosis not present

## 2023-02-01 DIAGNOSIS — S7002XA Contusion of left hip, initial encounter: Secondary | ICD-10-CM | POA: Diagnosis not present

## 2023-02-01 DIAGNOSIS — I959 Hypotension, unspecified: Secondary | ICD-10-CM | POA: Diagnosis not present

## 2023-02-01 MED ORDER — ACETAMINOPHEN 500 MG PO TABS: 1000.0000 mg | ORAL_TABLET | Freq: Once | ORAL | Status: DC

## 2023-02-01 MED ORDER — LOPERAMIDE HCL 2 MG PO CAPS
2.0000 mg | ORAL_CAPSULE | Freq: Once | ORAL | Status: AC
  Administered 2023-02-01: 2 mg via ORAL
  Filled 2023-02-01: qty 1

## 2023-02-01 MED ORDER — CALCIUM CARBONATE ANTACID 500 MG PO CHEW
1.0000 | CHEWABLE_TABLET | Freq: Once | ORAL | Status: AC
  Administered 2023-02-01: 200 mg via ORAL
  Filled 2023-02-01: qty 1

## 2023-02-01 MED ORDER — OMEPRAZOLE MAGNESIUM 20 MG PO TBEC: 20.0000 mg | DELAYED_RELEASE_TABLET | Freq: Every day | ORAL | 1 refills | Status: DC

## 2023-02-01 MED ORDER — IBUPROFEN 600 MG PO TABS
600.0000 mg | ORAL_TABLET | Freq: Once | ORAL | Status: AC
  Administered 2023-02-01: 600 mg via ORAL
  Filled 2023-02-01: qty 1

## 2023-02-01 NOTE — Discharge Instructions (Signed)
Please rest ice and elevate the left hip and knee.  You may take Tylenol as needed for pain.  Use omeprazole daily to help with reflux.

## 2023-02-01 NOTE — ED Provider Notes (Signed)
[place] EMERGENCY DEPARTMENT AT [facility] Provider Note   CSN: [id number] Arrival date & time: 02/01/23  1423     History  Chief Complaint  Patient presents with   Marletta Lor    [name] is a 77 y.o. female presents to the emergency department for evaluation of a fall.  Patient states she tripped over her left foot at rehab facility earlier today, fell and landed on her left side.  She has no pain at this time but states with movement she has a little soreness on the left hip and left knee.  She denies hitting her head, having neck or back pain.  No right-sided pain or no right or left upper extremity discomfort.  She denies any numbness tingling.  No abdominal pain.  Patient states she has a chronically bad left ankle with deformity which causes her to fall at times.  She has not any medications for the pain.  Patient does report some acid reflux, states she has been off reflux medication for a while.  Denies any nausea or vomiting.  No abdominal pain.  She is requesting omeprazole today.  HPI     Home Medications Prior to Admission medications   Medication Sig Start Date End Date Taking? Authorizing Provider  omeprazole (PRILOSEC OTC) 20 MG tablet Take 1 tablet (20 mg total) by mouth daily. 02/01/23 02/01/24 Yes Evon Slack, PA-C  aspirin 81 MG chewable tablet Chew 81 mg by mouth daily.    [provider]  cholecalciferol (VITAMIN D3) 25 MCG (1000 UNIT) tablet Take 2,000 Units by mouth daily.    [provider]  divalproex (DEPAKOTE) 125 MG DR tablet Take 125 mg by mouth 2 (two) times daily. 11/15/22   [provider]  Erenumab-aooe (AIMOVIG) 70 MG/ML SOAJ Inject 70 mg into the skin every 30 (thirty) days. 12/06/21   Glean Salvo, NP  furosemide (LASIX) 20 MG tablet Take 1 tablet (20 mg total) by mouth 2 (two) times daily. 03/31/22   Mayer Masker, PA-C  hyoscyamine (LEVSIN) 0.125 MG tablet Take 0.125 mg by mouth 2 (two) times daily.  07/27/22   [provider]  IMODIUM A-D 2 MG tablet Take 2 mg by mouth as needed. 06/28/22   [provider]  levothyroxine (SYNTHROID) 100 MCG tablet TAKE 1 TABLET BY MOUTH DAILY 01/16/22   Abonza, Maritza, PA-C  lidocaine (LIDODERM) 5 % Place 1 patch onto the skin daily. 09/24/22   [provider]  loperamide (IMODIUM A-D) 2 MG tablet Take by mouth. 09/20/22   [provider]  oxyCODONE (ROXICODONE) 15 MG immediate release tablet Take 1 tablet by mouth. May take up to 5 times daily 10/10/18   [provider]  pregabalin (LYRICA) 100 MG capsule Take 1 capsule (100 mg total) by mouth 2 (two) times daily. 10/06/22   Penumalli, Glenford Bayley, MD  QUEtiapine (SEROQUEL XR) 50 MG TB24 24 hr tablet Take 50 mg by mouth at bedtime. 11/17/22   [provider]  ramelteon (ROZEREM) 8 MG tablet Take 1 tablet by mouth daily. 11/26/18   [provider]  SUMAtriptan (IMITREX) 25 MG tablet Take 25 mg by mouth every 2 (two) hours as needed for migraine. May repeat in 2 hours if headache persists or recurs.    [provider]  traZODone (DESYREL) 50 MG tablet Take 50-150 mg at bedtime as needed by mouth for sleep (and may take one additional 50 mg tablet daily AS NEEDED for part of  a "headache cocktail").     [provider]  triamcinolone (KENALOG) 0.025 % cream Apply 1 Application topically 2 (two) times daily. 09/11/22   [provider]  venlafaxine (EFFEXOR) 37.5 MG tablet Take 1 tablet (37.5 mg total) by mouth 2 (two) times daily. 12/06/21   Glean Salvo, NP  vortioxetine HBr (TRINTELLIX) 20 MG TABS tablet Take 20 mg by mouth daily. 07/13/16   [provider]      Allergies    Bupropion hcl, Dairy aid [tilactase], Other, Penicillins, Azithromycin, and Erythromycin    Review of Systems   Review of Systems  Physical Exam Updated Vital Signs BP 111/67   Pulse 67   Temp 97.8 F (36.6 C) (Oral)   Resp 16   Wt 102 kg   SpO2  95%   BMI 39.83 kg/m  Physical Exam Constitutional:      Appearance: Normal appearance. She is well-developed. She is obese.  HENT:     Head: Normocephalic and atraumatic.  Eyes:     Conjunctiva/sclera: Conjunctivae normal.  Cardiovascular:     Rate and Rhythm: Normal rate.  Pulmonary:     Effort: Pulmonary effort is normal. No respiratory distress.  Abdominal:     General: There is no distension.     Palpations: Abdomen is soft.     Tenderness: There is no abdominal tenderness. There is no right CVA tenderness, left CVA tenderness or guarding.  Musculoskeletal:        General: Normal range of motion.     Cervical back: Normal range of motion.     Comments: No pain with bilateral hip internal and external rotation as well as active knee flexion extension.  No ligamentous laxity with valgus or varus stress testing of the left and right knee.  She is little focally tender along the left lateral hip but no tenderness about the left knee.  She has no tenderness along cervical thoracic or lumbar spinous process.  Skin:    General: Skin is warm.     Findings: No rash.  Neurological:     General: No focal deficit present.     Mental Status: She is alert and oriented to person, place, and time. Mental status is at baseline.     Cranial Nerves: No cranial nerve deficit.     Motor: No weakness.     Gait: Gait normal.  Psychiatric:        Mood and Affect: Mood normal.        Behavior: Behavior normal.        Thought Content: Thought content normal.     ED Results / Procedures / Treatments   Labs (all labs ordered are listed, but only abnormal results are displayed) Labs Reviewed - No data to display  EKG None  Radiology DG Hip Unilat With Pelvis 2-3 Views Left  Result Date: 02/01/2023 CLINICAL DATA:  Fall and left lower extremity pain. EXAM: DG HIP (WITH OR WITHOUT PELVIS) 2-3V LEFT COMPARISON:  None Available. FINDINGS: There is no acute fracture or dislocation.  Femoroacetabular alignment is normal. There is moderate femoroacetabular joint space narrowing bilaterally with subchondral sclerosis and mild osteophytosis. The SI joints and symphysis pubis are intact. Soft tissue calcifications projecting superior to the right iliac wing are noted. IMPRESSION: No acute fracture or dislocation. Electronically Signed   By: Lesia Hausen M.D.   On: 02/01/2023 15:49   DG Knee Complete 4 Views Left  Result Date: 02/01/2023 CLINICAL DATA:  Pain after fall  EXAM: LEFT KNEE - COMPLETE 5 VIEW COMPARISON:  None Available. FINDINGS: Osteopenia. Mild joint space loss of the lateral compartment. Moderate osteophytes are seen of all 3 compartments. No joint effusion on lateral view. No obvious fracture or dislocation. IMPRESSION: Tricompartmental degenerative changes with osteopenia. No fracture identified. Electronically Signed   By: Karen Kays M.D.   On: 02/01/2023 15:48    Procedures Procedures    Medications Ordered in ED Medications  acetaminophen (TYLENOL) tablet 1,000 mg (has no administration in time range)  calcium carbonate (TUMS - dosed in mg elemental calcium) chewable tablet 200 mg of elemental calcium (has no administration in time range)    ED Course/ Medical Decision Making/ A&P                             Medical Decision Making Amount and/or Complexity of Data Reviewed Radiology: ordered.  Risk OTC drugs. Prescription drug management.   Patient's presentation is most consistent with acute presentation with potential threat to life or bodily function.   76 year old female with mechanical fall just prior to arrival earlier today at rehab facility.  She has chronically arthritic left ankle.  No acute ankle pain today but does complain of some left hip and left knee discomfort only with movement.  X-rays of the left hip and knee ordered and reviewed by me today show no evidence of acute bony abnormality.  History, physical exam and x-rays  reassuring there is no fracture to the left hip or left knee.  She does have some arthritic changes throughout the left knee.  Patient is given ibuprofen for acute pain.  She denies any head injury, neck or back pain.  No LOC or nausea or vomiting.  Patient appears well.  Vital signs are stable.  Patient does report history of reflux and states she is been having several weeks of acid in her throat with laying down.  She denies any abdominal pain nausea or vomiting.  She is requesting a prescription for omeprazole.  Prescription for omeprazole given today. Final Clinical Impression(s) / ED Diagnoses Final diagnoses:  Fall, initial encounter  Contusion of left hip, initial encounter  Contusion of left knee, initial encounter  Gastric reflux    Rx / DC Orders ED Discharge Orders          Ordered    omeprazole (PRILOSEC OTC) 20 MG tablet  Daily        02/01/23 1656              Ronnette Juniper 02/01/23 1706    Dionne Bucy, MD 02/02/23 1347

## 2023-02-01 NOTE — ED Triage Notes (Addendum)
Arrives from [facility] via [facility].  Patient was transferring from recliner to wheelchair and fell.  Able to stand without assistance.  C/O left hip pain. Patient with known left ankle deformity, which impacts gait.  VS wnl.

## 2023-02-01 NOTE — ED Triage Notes (Signed)
PT complains of left hip and left knee pain. Sliped and tripped and landed on buttock. No LOC.

## 2023-02-02 DIAGNOSIS — G894 Chronic pain syndrome: Secondary | ICD-10-CM | POA: Diagnosis not present

## 2023-02-02 DIAGNOSIS — M545 Low back pain, unspecified: Secondary | ICD-10-CM | POA: Diagnosis not present

## 2023-02-02 DIAGNOSIS — G603 Idiopathic progressive neuropathy: Secondary | ICD-10-CM | POA: Diagnosis not present

## 2023-02-02 DIAGNOSIS — F0393 Unspecified dementia, unspecified severity, with mood disturbance: Secondary | ICD-10-CM | POA: Diagnosis not present

## 2023-02-02 DIAGNOSIS — F339 Major depressive disorder, recurrent, unspecified: Secondary | ICD-10-CM | POA: Diagnosis not present

## 2023-02-02 DIAGNOSIS — F331 Major depressive disorder, recurrent, moderate: Secondary | ICD-10-CM | POA: Diagnosis not present

## 2023-02-02 DIAGNOSIS — G43709 Chronic migraine without aura, not intractable, without status migrainosus: Secondary | ICD-10-CM | POA: Diagnosis not present

## 2023-02-07 ENCOUNTER — Emergency Department: Payer: Medicare Other

## 2023-02-07 ENCOUNTER — Inpatient Hospital Stay
Admission: EM | Admit: 2023-02-07 | Discharge: 2023-03-05 | DRG: 981 | Disposition: E | Payer: Medicare Other | Attending: Internal Medicine | Admitting: Internal Medicine

## 2023-02-07 ENCOUNTER — Other Ambulatory Visit: Payer: Self-pay

## 2023-02-07 ENCOUNTER — Encounter: Payer: Self-pay | Admitting: Internal Medicine

## 2023-02-07 DIAGNOSIS — J9601 Acute respiratory failure with hypoxia: Secondary | ICD-10-CM | POA: Diagnosis not present

## 2023-02-07 DIAGNOSIS — I5031 Acute diastolic (congestive) heart failure: Secondary | ICD-10-CM | POA: Diagnosis not present

## 2023-02-07 DIAGNOSIS — I480 Paroxysmal atrial fibrillation: Secondary | ICD-10-CM | POA: Diagnosis not present

## 2023-02-07 DIAGNOSIS — S199XXA Unspecified injury of neck, initial encounter: Secondary | ICD-10-CM | POA: Diagnosis not present

## 2023-02-07 DIAGNOSIS — I110 Hypertensive heart disease with heart failure: Secondary | ICD-10-CM | POA: Diagnosis present

## 2023-02-07 DIAGNOSIS — G894 Chronic pain syndrome: Secondary | ICD-10-CM | POA: Diagnosis not present

## 2023-02-07 DIAGNOSIS — R58 Hemorrhage, not elsewhere classified: Secondary | ICD-10-CM | POA: Diagnosis not present

## 2023-02-07 DIAGNOSIS — Z881 Allergy status to other antibiotic agents status: Secondary | ICD-10-CM

## 2023-02-07 DIAGNOSIS — I6300 Cerebral infarction due to thrombosis of unspecified precerebral artery: Secondary | ICD-10-CM | POA: Diagnosis present

## 2023-02-07 DIAGNOSIS — M4854XA Collapsed vertebra, not elsewhere classified, thoracic region, initial encounter for fracture: Secondary | ICD-10-CM | POA: Diagnosis present

## 2023-02-07 DIAGNOSIS — R079 Chest pain, unspecified: Secondary | ICD-10-CM | POA: Diagnosis not present

## 2023-02-07 DIAGNOSIS — W19XXXA Unspecified fall, initial encounter: Secondary | ICD-10-CM | POA: Diagnosis present

## 2023-02-07 DIAGNOSIS — G9341 Metabolic encephalopathy: Secondary | ICD-10-CM | POA: Diagnosis present

## 2023-02-07 DIAGNOSIS — Z7901 Long term (current) use of anticoagulants: Secondary | ICD-10-CM

## 2023-02-07 DIAGNOSIS — N390 Urinary tract infection, site not specified: Secondary | ICD-10-CM | POA: Diagnosis not present

## 2023-02-07 DIAGNOSIS — G935 Compression of brain: Secondary | ICD-10-CM | POA: Diagnosis not present

## 2023-02-07 DIAGNOSIS — F03918 Unspecified dementia, unspecified severity, with other behavioral disturbance: Secondary | ICD-10-CM | POA: Diagnosis present

## 2023-02-07 DIAGNOSIS — D500 Iron deficiency anemia secondary to blood loss (chronic): Secondary | ICD-10-CM | POA: Diagnosis present

## 2023-02-07 DIAGNOSIS — N393 Stress incontinence (female) (male): Secondary | ICD-10-CM | POA: Diagnosis not present

## 2023-02-07 DIAGNOSIS — Z515 Encounter for palliative care: Secondary | ICD-10-CM

## 2023-02-07 DIAGNOSIS — Z9049 Acquired absence of other specified parts of digestive tract: Secondary | ICD-10-CM

## 2023-02-07 DIAGNOSIS — J9690 Respiratory failure, unspecified, unspecified whether with hypoxia or hypercapnia: Secondary | ICD-10-CM | POA: Diagnosis not present

## 2023-02-07 DIAGNOSIS — R29818 Other symptoms and signs involving the nervous system: Secondary | ICD-10-CM | POA: Diagnosis not present

## 2023-02-07 DIAGNOSIS — I6201 Nontraumatic acute subdural hemorrhage: Secondary | ICD-10-CM | POA: Diagnosis not present

## 2023-02-07 DIAGNOSIS — Y92129 Unspecified place in nursing home as the place of occurrence of the external cause: Secondary | ICD-10-CM

## 2023-02-07 DIAGNOSIS — E669 Obesity, unspecified: Secondary | ICD-10-CM | POA: Diagnosis not present

## 2023-02-07 DIAGNOSIS — R4701 Aphasia: Secondary | ICD-10-CM | POA: Diagnosis not present

## 2023-02-07 DIAGNOSIS — N3941 Urge incontinence: Secondary | ICD-10-CM | POA: Diagnosis not present

## 2023-02-07 DIAGNOSIS — R414 Neurologic neglect syndrome: Secondary | ICD-10-CM | POA: Diagnosis not present

## 2023-02-07 DIAGNOSIS — R600 Localized edema: Secondary | ICD-10-CM | POA: Diagnosis not present

## 2023-02-07 DIAGNOSIS — Z8719 Personal history of other diseases of the digestive system: Secondary | ICD-10-CM

## 2023-02-07 DIAGNOSIS — E039 Hypothyroidism, unspecified: Secondary | ICD-10-CM | POA: Diagnosis present

## 2023-02-07 DIAGNOSIS — R519 Headache, unspecified: Secondary | ICD-10-CM | POA: Diagnosis not present

## 2023-02-07 DIAGNOSIS — Z4659 Encounter for fitting and adjustment of other gastrointestinal appliance and device: Secondary | ICD-10-CM | POA: Diagnosis not present

## 2023-02-07 DIAGNOSIS — R579 Shock, unspecified: Secondary | ICD-10-CM | POA: Diagnosis not present

## 2023-02-07 DIAGNOSIS — R0902 Hypoxemia: Secondary | ICD-10-CM | POA: Diagnosis not present

## 2023-02-07 DIAGNOSIS — G4489 Other headache syndrome: Secondary | ICD-10-CM

## 2023-02-07 DIAGNOSIS — I4891 Unspecified atrial fibrillation: Secondary | ICD-10-CM | POA: Diagnosis not present

## 2023-02-07 DIAGNOSIS — I351 Nonrheumatic aortic (valve) insufficiency: Secondary | ICD-10-CM | POA: Diagnosis not present

## 2023-02-07 DIAGNOSIS — R0602 Shortness of breath: Secondary | ICD-10-CM | POA: Diagnosis not present

## 2023-02-07 DIAGNOSIS — R2689 Other abnormalities of gait and mobility: Secondary | ICD-10-CM | POA: Diagnosis not present

## 2023-02-07 DIAGNOSIS — Z96611 Presence of right artificial shoulder joint: Secondary | ICD-10-CM | POA: Diagnosis present

## 2023-02-07 DIAGNOSIS — K219 Gastro-esophageal reflux disease without esophagitis: Secondary | ICD-10-CM | POA: Diagnosis present

## 2023-02-07 DIAGNOSIS — Z66 Do not resuscitate: Secondary | ICD-10-CM | POA: Diagnosis present

## 2023-02-07 DIAGNOSIS — K5730 Diverticulosis of large intestine without perforation or abscess without bleeding: Secondary | ICD-10-CM | POA: Diagnosis not present

## 2023-02-07 DIAGNOSIS — Z85828 Personal history of other malignant neoplasm of skin: Secondary | ICD-10-CM

## 2023-02-07 DIAGNOSIS — J811 Chronic pulmonary edema: Secondary | ICD-10-CM | POA: Diagnosis not present

## 2023-02-07 DIAGNOSIS — R296 Repeated falls: Secondary | ICD-10-CM | POA: Diagnosis present

## 2023-02-07 DIAGNOSIS — Z9181 History of falling: Secondary | ICD-10-CM

## 2023-02-07 DIAGNOSIS — N179 Acute kidney failure, unspecified: Secondary | ICD-10-CM | POA: Diagnosis present

## 2023-02-07 DIAGNOSIS — I4721 Torsades de pointes: Secondary | ICD-10-CM | POA: Diagnosis not present

## 2023-02-07 DIAGNOSIS — Z7982 Long term (current) use of aspirin: Secondary | ICD-10-CM

## 2023-02-07 DIAGNOSIS — F0393 Unspecified dementia, unspecified severity, with mood disturbance: Secondary | ICD-10-CM | POA: Diagnosis present

## 2023-02-07 DIAGNOSIS — S0081XA Abrasion of other part of head, initial encounter: Secondary | ICD-10-CM | POA: Diagnosis present

## 2023-02-07 DIAGNOSIS — Z6836 Body mass index (BMI) 36.0-36.9, adult: Secondary | ICD-10-CM

## 2023-02-07 DIAGNOSIS — E876 Hypokalemia: Secondary | ICD-10-CM | POA: Diagnosis not present

## 2023-02-07 DIAGNOSIS — R001 Bradycardia, unspecified: Secondary | ICD-10-CM | POA: Diagnosis not present

## 2023-02-07 DIAGNOSIS — I6200 Nontraumatic subdural hemorrhage, unspecified: Secondary | ICD-10-CM | POA: Diagnosis not present

## 2023-02-07 DIAGNOSIS — Z823 Family history of stroke: Secondary | ICD-10-CM

## 2023-02-07 DIAGNOSIS — E66811 Obesity, class 1: Secondary | ICD-10-CM | POA: Diagnosis present

## 2023-02-07 DIAGNOSIS — I340 Nonrheumatic mitral (valve) insufficiency: Secondary | ICD-10-CM | POA: Diagnosis not present

## 2023-02-07 DIAGNOSIS — K529 Noninfective gastroenteritis and colitis, unspecified: Secondary | ICD-10-CM | POA: Diagnosis present

## 2023-02-07 DIAGNOSIS — I4819 Other persistent atrial fibrillation: Principal | ICD-10-CM | POA: Diagnosis present

## 2023-02-07 DIAGNOSIS — E8809 Other disorders of plasma-protein metabolism, not elsewhere classified: Secondary | ICD-10-CM | POA: Diagnosis present

## 2023-02-07 DIAGNOSIS — F1120 Opioid dependence, uncomplicated: Secondary | ICD-10-CM | POA: Diagnosis present

## 2023-02-07 DIAGNOSIS — Z880 Allergy status to penicillin: Secondary | ICD-10-CM

## 2023-02-07 DIAGNOSIS — N3090 Cystitis, unspecified without hematuria: Secondary | ICD-10-CM | POA: Diagnosis present

## 2023-02-07 DIAGNOSIS — Z811 Family history of alcohol abuse and dependence: Secondary | ICD-10-CM

## 2023-02-07 DIAGNOSIS — G40909 Epilepsy, unspecified, not intractable, without status epilepticus: Secondary | ICD-10-CM | POA: Diagnosis present

## 2023-02-07 DIAGNOSIS — Z800 Family history of malignant neoplasm of digestive organs: Secondary | ICD-10-CM

## 2023-02-07 DIAGNOSIS — K222 Esophageal obstruction: Secondary | ICD-10-CM

## 2023-02-07 DIAGNOSIS — I4729 Other ventricular tachycardia: Secondary | ICD-10-CM | POA: Diagnosis not present

## 2023-02-07 DIAGNOSIS — Z888 Allergy status to other drugs, medicaments and biological substances status: Secondary | ICD-10-CM

## 2023-02-07 DIAGNOSIS — G43909 Migraine, unspecified, not intractable, without status migrainosus: Secondary | ICD-10-CM | POA: Diagnosis present

## 2023-02-07 DIAGNOSIS — R0600 Dyspnea, unspecified: Secondary | ICD-10-CM | POA: Diagnosis not present

## 2023-02-07 DIAGNOSIS — G603 Idiopathic progressive neuropathy: Secondary | ICD-10-CM | POA: Diagnosis not present

## 2023-02-07 DIAGNOSIS — Z043 Encounter for examination and observation following other accident: Secondary | ICD-10-CM | POA: Diagnosis not present

## 2023-02-07 DIAGNOSIS — E871 Hypo-osmolality and hyponatremia: Secondary | ICD-10-CM | POA: Diagnosis not present

## 2023-02-07 DIAGNOSIS — D696 Thrombocytopenia, unspecified: Secondary | ICD-10-CM | POA: Diagnosis not present

## 2023-02-07 DIAGNOSIS — R2681 Unsteadiness on feet: Secondary | ICD-10-CM | POA: Diagnosis present

## 2023-02-07 DIAGNOSIS — I959 Hypotension, unspecified: Secondary | ICD-10-CM | POA: Diagnosis present

## 2023-02-07 DIAGNOSIS — I509 Heart failure, unspecified: Secondary | ICD-10-CM | POA: Diagnosis not present

## 2023-02-07 DIAGNOSIS — S069X0A Unspecified intracranial injury without loss of consciousness, initial encounter: Secondary | ICD-10-CM | POA: Diagnosis not present

## 2023-02-07 DIAGNOSIS — R300 Dysuria: Secondary | ICD-10-CM | POA: Diagnosis not present

## 2023-02-07 DIAGNOSIS — Z79890 Hormone replacement therapy (postmenopausal): Secondary | ICD-10-CM

## 2023-02-07 DIAGNOSIS — N3000 Acute cystitis without hematuria: Secondary | ICD-10-CM | POA: Diagnosis not present

## 2023-02-07 DIAGNOSIS — I499 Cardiac arrhythmia, unspecified: Secondary | ICD-10-CM | POA: Diagnosis not present

## 2023-02-07 DIAGNOSIS — Z96659 Presence of unspecified artificial knee joint: Secondary | ICD-10-CM | POA: Diagnosis present

## 2023-02-07 DIAGNOSIS — D508 Other iron deficiency anemias: Secondary | ICD-10-CM | POA: Diagnosis not present

## 2023-02-07 DIAGNOSIS — Z7189 Other specified counseling: Secondary | ICD-10-CM | POA: Diagnosis not present

## 2023-02-07 DIAGNOSIS — M545 Low back pain, unspecified: Secondary | ICD-10-CM | POA: Diagnosis not present

## 2023-02-07 DIAGNOSIS — Z79899 Other long term (current) drug therapy: Secondary | ICD-10-CM

## 2023-02-07 DIAGNOSIS — I493 Ventricular premature depolarization: Secondary | ICD-10-CM | POA: Diagnosis not present

## 2023-02-07 DIAGNOSIS — S065XAA Traumatic subdural hemorrhage with loss of consciousness status unknown, initial encounter: Secondary | ICD-10-CM | POA: Diagnosis not present

## 2023-02-07 DIAGNOSIS — Z8249 Family history of ischemic heart disease and other diseases of the circulatory system: Secondary | ICD-10-CM

## 2023-02-07 DIAGNOSIS — R000 Tachycardia, unspecified: Secondary | ICD-10-CM | POA: Diagnosis not present

## 2023-02-07 LAB — COMPREHENSIVE METABOLIC PANEL
ALT: 11 U/L (ref 0–44)
AST: 17 U/L (ref 15–41)
Albumin: 3.3 g/dL — ABNORMAL LOW (ref 3.5–5.0)
Alkaline Phosphatase: 87 U/L (ref 38–126)
Anion gap: 8 (ref 5–15)
BUN: 19 mg/dL (ref 8–23)
CO2: 29 mmol/L (ref 22–32)
Calcium: 7.8 mg/dL — ABNORMAL LOW (ref 8.9–10.3)
Chloride: 105 mmol/L (ref 98–111)
Creatinine, Ser: 1.07 mg/dL — ABNORMAL HIGH (ref 0.44–1.00)
GFR, Estimated: 53 mL/min — ABNORMAL LOW (ref >60)
Glucose, Bld: 52 mg/dL — ABNORMAL LOW (ref 70–99)
Potassium: 3.4 mmol/L — ABNORMAL LOW (ref 3.5–5.1)
Sodium: 142 mmol/L (ref 135–145)
Total Bilirubin: 0.5 mg/dL (ref 0.3–1.2)
Total Protein: 6.3 g/dL — ABNORMAL LOW (ref 6.5–8.1)

## 2023-02-07 LAB — URINALYSIS, W/ REFLEX TO CULTURE (INFECTION SUSPECTED)
Bilirubin Urine: NEGATIVE
Glucose, UA: NEGATIVE mg/dL
Ketones, ur: NEGATIVE mg/dL
Nitrite: NEGATIVE
Protein, ur: 30 mg/dL — AB
Specific Gravity, Urine: 1.012 (ref 1.005–1.030)
Squamous Epithelial / HPF: NONE SEEN /HPF (ref 0–5)
WBC, UA: >50 WBC/hpf (ref 0–5)
pH: 5 (ref 5.0–8.0)

## 2023-02-07 LAB — CBC WITH DIFFERENTIAL/PLATELET
Abs Immature Granulocytes: 0.02 10*3/uL (ref 0.00–0.07)
Basophils Absolute: 0 10*3/uL (ref 0.0–0.1)
Basophils Relative: 1 %
Eosinophils Absolute: 0.2 10*3/uL (ref 0.0–0.5)
Eosinophils Relative: 3 %
HCT: 36.5 % (ref 36.0–46.0)
Hemoglobin: 11.4 g/dL — ABNORMAL LOW (ref 12.0–15.0)
Immature Granulocytes: 0 %
Lymphocytes Relative: 24 %
Lymphs Abs: 1.2 10*3/uL (ref 0.7–4.0)
MCH: 26.3 pg (ref 26.0–34.0)
MCHC: 31.2 g/dL (ref 30.0–36.0)
MCV: 84.1 fL (ref 80.0–100.0)
Monocytes Absolute: 0.7 10*3/uL (ref 0.1–1.0)
Monocytes Relative: 13 %
Neutro Abs: 3.1 10*3/uL (ref 1.7–7.7)
Neutrophils Relative %: 59 %
Platelets: 157 10*3/uL (ref 150–400)
RBC: 4.34 MIL/uL (ref 3.87–5.11)
RDW: 16.6 % — ABNORMAL HIGH (ref 11.5–15.5)
WBC: 5.2 10*3/uL (ref 4.0–10.5)
nRBC: 0.4 % — ABNORMAL HIGH (ref 0.0–0.2)

## 2023-02-07 LAB — APTT: aPTT: 29 seconds (ref 24–36)

## 2023-02-07 LAB — PROTIME-INR
INR: 1.2 (ref 0.8–1.2)
Prothrombin Time: 15.5 seconds — ABNORMAL HIGH (ref 11.4–15.2)

## 2023-02-07 LAB — BRAIN NATRIURETIC PEPTIDE: B Natriuretic Peptide: 804.3 pg/mL — ABNORMAL HIGH (ref 0.0–100.0)

## 2023-02-07 LAB — LACTIC ACID, PLASMA: Lactic Acid, Venous: 1.9 mmol/L (ref 0.5–1.9)

## 2023-02-07 LAB — TROPONIN I (HIGH SENSITIVITY): Troponin I (High Sensitivity): 9 ng/L (ref <18)

## 2023-02-07 MED ORDER — SODIUM CHLORIDE 0.9 % IV BOLUS
1000.0000 mL | Freq: Once | INTRAVENOUS | Status: AC
  Administered 2023-02-07: 1000 mL via INTRAVENOUS

## 2023-02-07 MED ORDER — SODIUM CHLORIDE 0.9 % IV SOLN
2.0000 g | INTRAVENOUS | Status: DC
  Administered 2023-02-07 – 2023-02-08 (×2): 2 g via INTRAVENOUS
  Filled 2023-02-07 (×2): qty 20

## 2023-02-07 MED ORDER — METRONIDAZOLE 500 MG/100ML IV SOLN: 500.0000 mg | Freq: Once | INTRAVENOUS | Status: DC

## 2023-02-07 MED ORDER — IOHEXOL 300 MG/ML  SOLN
100.0000 mL | Freq: Once | INTRAMUSCULAR | Status: AC | PRN
  Administered 2023-02-07: 100 mL via INTRAVENOUS

## 2023-02-07 MED ORDER — SODIUM CHLORIDE 0.9 % IV SOLN
2.0000 g | Freq: Once | INTRAVENOUS | Status: AC
  Administered 2023-02-07: 2 g via INTRAVENOUS
  Filled 2023-02-07: qty 12.5

## 2023-02-07 MED ORDER — VANCOMYCIN HCL IN DEXTROSE 1-5 GM/200ML-% IV SOLN: 1000.0000 mg | Freq: Once | INTRAVENOUS | Status: DC

## 2023-02-07 MED ORDER — DILTIAZEM HCL-DEXTROSE 125-5 MG/125ML-% IV SOLN (PREMIX)
5.0000 mg/h | INTRAVENOUS | Status: DC
  Administered 2023-02-07: 5 mg/h via INTRAVENOUS
  Administered 2023-02-08: 12.5 mg/h via INTRAVENOUS
  Filled 2023-02-07 (×2): qty 125

## 2023-02-07 MED ORDER — DILTIAZEM HCL 25 MG/5ML IV SOLN
10.0000 mg | Freq: Once | INTRAVENOUS | Status: AC
  Administered 2023-02-07: 10 mg via INTRAVENOUS
  Filled 2023-02-07: qty 5

## 2023-02-07 NOTE — Progress Notes (Signed)
   02/07/23 2350  HAS-BLED Score  Hypertension (Uncontrolled, SBP > 160 mmHg) 1  Abnormal Renal Function  0  Abnormal Liver Function  0  Stroke History 1  Prior major bleeding or predisposition to bleeding 0  Labile INR 0  Age > 65 1  Medication usage predisposing to bleeding 0  Alcohol use 0  HAS-BLED Score 3  Yearly Major Bleeding Risk % 3.74

## 2023-02-07 NOTE — H&P (Incomplete)
History and Physical    Patient: [name]:[id number] DOB: 1946-07-18 DOA: 02/07/2023 DOS: the patient was seen and examined on 02/07/2023 PCP: Smiley Houseman, NP  Patient coming from: SNF  Chief Complaint:  Chief Complaint  Patient presents with  . Fall    HPI: TAJ DILS is a 77 y.o. female with medical history significant for seizures, hypothyroidism, depression, cardiac history presented at SNF. Patient has a history of falls as well .  Today she was transferring from recliner at baseline she is able to ambulate and stand without assistance but today she had pain. No Reports tingling fevers chills headaches blurred vision.  Patient does have some soreness in her right knee as well the right is a deformity of the right ankle Considered high risk for falls.  Hip x-ray is negative for any acute fracture or dislocation right knee that was x-rayed in the emergency room shows tricompartmental degenerative changes neutropenia. 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  Has gone down and blood pressure has improved.  Patient was therefore started on diltiazem drip and white 12 send blood pressure has  Improved.  EMS gave patient 500 cc normal saline.  Patient does not report any shortness of breath.  Review of Systems: As mentioned in the history of present illness. All other systems reviewed and are negative.  Past Medical History:  Diagnosis Date  . Anemia    took Fe- orally 2 yrs. ago   . Anxiety    panic attacks   . Arthritis    shoulders, knees  . Cancer (HCC)    skin- preCA  . Depression   . GERD (gastroesophageal reflux disease)    uses Protonix as needed   . Headache(784.0)   . Humerus fracture    Right   . Hyposmolality and/or hyponatremia 02/27/2013  . Hypothyroidism   . IBS (irritable bowel syndrome)   . Injury of left foot 09/05/2018   . Injury of right hand 04/27/2014  . Memory loss   . Mental disorder   . Migraine   . Preoperative examination 02/26/2013  . Seizures (HCC)    caused by depression , seritonin seizure - effected short term memory    . Shortness of breath    Past Surgical History:  Procedure Laterality Date  . ABDOMINAL HYSTERECTOMY    . APPENDECTOMY    . BACK SURGERY  1990's  . BREAST SURGERY  1972   augmentation   . CHOLECYSTECTOMY    . GASTRECTOMY  1994   Due to ulcer, required multiple revisions.   Marland Kitchen HERNIA REPAIR    . ORIF ANKLE FRACTURE Right 2004  . REPLACEMENT TOTAL KNEE    . REVERSE SHOULDER ARTHROPLASTY Right 05/16/2013   Procedure: RIGHT HEMI ARTHROPLASTY  VERSES  REVERSE TOTAL SHOULDER ARTHROPLASTY ;  Surgeon: Verlee Rossetti, MD;  Location: [facility] OR;  Service: Orthopedics;  Laterality: Right;  . SPINE SURGERY  2005   Lumbar spinal stenosis   . STOMACH SURGERY     1/3 resection for obstruction  . TONSILLECTOMY    . TUBAL LIGATION     Social History:  reports that she has never smoked. She has never used smokeless tobacco. She reports current alcohol use. She reports that she does not use drugs.  Allergies  Allergen Reactions  . Bupropion Hcl Other (See Comments)  SEIZURES!!  . Other   . Penicillins   . Tilactase Diarrhea  . Azithromycin Rash  . Erythromycin Rash    Family History  Problem Relation Age of Onset  . Aneurysm Mother   . Heart disease Mother   . Cancer Father        pancreatic  . Stroke Father 55  . Hypertension Father   . Alcohol abuse Father   . Heart disease Maternal Grandfather     Prior to Admission medications   Medication Sig Start Date End Date Taking? Authorizing Provider  aspirin 81 MG chewable tablet Chew 81 mg by mouth daily.    [provider]  cholecalciferol (VITAMIN D3) 25 MCG (1000 UNIT) tablet Take 2,000 Units by mouth daily.    [provider]  divalproex (DEPAKOTE) 125 MG DR tablet Take 125 mg by mouth 2 (two)  times daily. 11/15/22   [provider]  Erenumab-aooe (AIMOVIG) 70 MG/ML SOAJ Inject 70 mg into the skin every 30 (thirty) days. 12/06/21   Glean Salvo, NP  furosemide (LASIX) 20 MG tablet Take 1 tablet (20 mg total) by mouth 2 (two) times daily. 03/31/22   Mayer Masker, PA-C  hyoscyamine (LEVSIN) 0.125 MG tablet Take 0.125 mg by mouth 2 (two) times daily. 07/27/22   [provider]  IMODIUM A-D 2 MG tablet Take 2 mg by mouth as needed. 06/28/22   [provider]  levothyroxine (SYNTHROID) 100 MCG tablet TAKE 1 TABLET BY MOUTH DAILY 01/16/22   Abonza, Maritza, PA-C  lidocaine (LIDODERM) 5 % Place 1 patch onto the skin daily. 09/24/22   [provider]  loperamide (IMODIUM A-D) 2 MG tablet Take by mouth. 09/20/22   [provider]  omeprazole (PRILOSEC OTC) 20 MG tablet Take 1 tablet (20 mg total) by mouth daily. 02/01/23 02/01/24  Evon Slack, PA-C  oxyCODONE (ROXICODONE) 15 MG immediate release tablet Take 1 tablet by mouth. May take up to 5 times daily 10/10/18   [provider]  pregabalin (LYRICA) 100 MG capsule Take 1 capsule (100 mg total) by mouth 2 (two) times daily. 10/06/22   Penumalli, Glenford Bayley, MD  QUEtiapine (SEROQUEL XR) 50 MG TB24 24 hr tablet Take 50 mg by mouth at bedtime. 11/17/22   [provider]  ramelteon (ROZEREM) 8 MG tablet Take 1 tablet by mouth daily. 11/26/18   [provider]  SUMAtriptan (IMITREX) 25 MG tablet Take 25 mg by mouth every 2 (two) hours as needed for migraine. May repeat in 2 hours if headache persists or recurs.    [provider]  traZODone (DESYREL) 50 MG tablet Take 50-150 mg at bedtime as needed by mouth for sleep (and may take one additional 50 mg tablet daily AS NEEDED for part of a "headache cocktail").     [provider]  triamcinolone (KENALOG) 0.025 % cream Apply 1 Application topically 2 (two) times daily. 09/11/22   [provider]  venlafaxine  (EFFEXOR) 37.5 MG tablet Take 1 tablet (37.5 mg total) by mouth 2 (two) times daily. 12/06/21   Glean Salvo, NP  vortioxetine HBr (TRINTELLIX) 20 MG TABS tablet Take 20 mg by mouth daily. 07/13/16   [provider]    Physical Exam: Vitals:   02/07/23 2245 02/07/23 2300 02/07/23 2315 02/07/23 2330  BP:  98/72  102/81  Pulse: 68 79 87 (!) 106  Resp: 14 10 12 12   Temp:      TempSrc:  SpO2: 94% 98% 96% 98%  Weight:      Height:       Physical Exam Constitutional:      Appearance: She is not ill-appearing.  HENT:     Head: Normocephalic.     Left Ear: External ear normal.     Nose: Nose normal.  Eyes:     Pupils: Pupils are equal, round, and reactive to light.  Cardiovascular:     Rate and Rhythm: Rhythm irregular.     Pulses: Normal pulses.     Heart sounds: Normal heart sounds.  Pulmonary:     Effort: Pulmonary effort is normal.     Breath sounds: Normal breath sounds.  Abdominal:     General: Bowel sounds are normal.     Palpations: Abdomen is soft.  Musculoskeletal:     Right lower leg: No edema.     Left lower leg: No edema.  Skin:    General: Skin is warm.  Neurological:     General: No focal deficit present.     Mental Status: She is alert and oriented to person, place, and time.  Psychiatric:        Mood and Affect: Mood normal.        Behavior: Behavior normal.     Labs on Admission: I have personally reviewed following labs and imaging studies Results for orders placed or performed during the hospital encounter of 02/07/23 (from the past 24 hour(s))  Lactic acid, plasma     Status: None   Collection Time: 02/07/23  7:33 PM  Result Value Ref Range   Lactic Acid, Venous 1.9 0.5 - 1.9 mmol/L  Comprehensive metabolic panel     Status: Abnormal   Collection Time: 02/07/23  7:33 PM  Result Value Ref Range   Sodium 142 135 - 145 mmol/L   Potassium 3.4 (L) 3.5 - 5.1 mmol/L   Chloride 105 98 - 111 mmol/L   CO2 29 22 - 32 mmol/L   Glucose, Bld 52  (L) 70 - 99 mg/dL   BUN 19 8 - 23 mg/dL   Creatinine, Ser 1.61 (H) 0.44 - 1.00 mg/dL   Calcium 7.8 (L) 8.9 - 10.3 mg/dL   Total Protein 6.3 (L) 6.5 - 8.1 g/dL   Albumin 3.3 (L) 3.5 - 5.0 g/dL   AST 17 15 - 41 U/L   ALT 11 0 - 44 U/L   Alkaline Phosphatase 87 38 - 126 U/L   Total Bilirubin 0.5 0.3 - 1.2 mg/dL   GFR, Estimated 53 (L) >60 mL/min   Anion gap 8 5 - 15  CBC with Differential     Status: Abnormal   Collection Time: 02/07/23  7:33 PM  Result Value Ref Range   WBC 5.2 4.0 - 10.5 K/uL   RBC 4.34 3.87 - 5.11 MIL/uL   Hemoglobin 11.4 (L) 12.0 - 15.0 g/dL   HCT 09.6 04.5 - 40.9 %   MCV 84.1 80.0 - 100.0 fL   MCH 26.3 26.0 - 34.0 pg   MCHC 31.2 30.0 - 36.0 g/dL   RDW 81.1 (H) 91.4 - 78.2 %   Platelets 157 150 - 400 K/uL   nRBC 0.4 (H) 0.0 - 0.2 %   Neutrophils Relative % 59 %   Neutro Abs 3.1 1.7 - 7.7 K/uL   Lymphocytes Relative 24 %   Lymphs Abs 1.2 0.7 - 4.0 K/uL   Monocytes Relative 13 %   Monocytes Absolute 0.7 0.1 - 1.0 K/uL  Eosinophils Relative 3 %   Eosinophils Absolute 0.2 0.0 - 0.5 K/uL   Basophils Relative 1 %   Basophils Absolute 0.0 0.0 - 0.1 K/uL   Immature Granulocytes 0 %   Abs Immature Granulocytes 0.02 0.00 - 0.07 K/uL  Protime-INR     Status: Abnormal   Collection Time: 02/07/23  7:33 PM  Result Value Ref Range   Prothrombin Time 15.5 (H) 11.4 - 15.2 seconds   INR 1.2 0.8 - 1.2  APTT     Status: None   Collection Time: 02/07/23  7:33 PM  Result Value Ref Range   aPTT 29 24 - 36 seconds  Brain natriuretic peptide     Status: Abnormal   Collection Time: 02/07/23  7:33 PM  Result Value Ref Range   B Natriuretic Peptide 804.3 (H) 0.0 - 100.0 pg/mL  Urinalysis, w/ Reflex to Culture (Infection Suspected) -Urine, Clean Catch     Status: Abnormal   Collection Time: 02/07/23  8:47 PM  Result Value Ref Range   Specimen Source URINE, RANDOM    Color, Urine YELLOW (A) YELLOW   APPearance CLOUDY (A) CLEAR   Specific Gravity, Urine 1.012 1.005 - 1.030    pH 5.0 5.0 - 8.0   Glucose, UA NEGATIVE NEGATIVE mg/dL   Hgb urine dipstick SMALL (A) NEGATIVE   Bilirubin Urine NEGATIVE NEGATIVE   Ketones, ur NEGATIVE NEGATIVE mg/dL   Protein, ur 30 (A) NEGATIVE mg/dL   Nitrite NEGATIVE NEGATIVE   Leukocytes,Ua LARGE (A) NEGATIVE   RBC / HPF 11-20 0 - 5 RBC/hpf   WBC, UA >50 0 - 5 WBC/hpf   Bacteria, UA RARE (A) NONE SEEN   Squamous Epithelial / HPF NONE SEEN 0 - 5 /HPF   WBC Clumps PRESENT   Troponin I (High Sensitivity)     Status: None   Collection Time: 02/07/23 10:27 PM  Result Value Ref Range   Troponin I (High Sensitivity) 9 <18 ng/L    CBC: Recent Labs  Lab 02/07/23 1933  WBC 5.2  NEUTROABS 3.1  HGB 11.4*  HCT 36.5  MCV 84.1  PLT 157   Basic Metabolic Panel: Recent Labs  Lab 02/07/23 1933  NA 142  K 3.4*  CL 105  CO2 29  GLUCOSE 52*  BUN 19  CREATININE 1.07*  CALCIUM 7.8*   GFR: Estimated Creatinine Clearance: 50.5 mL/min (A) (by C-G formula based on SCr of 1.07 mg/dL (H)). Liver Function Tests: Recent Labs  Lab 02/07/23 1933  AST 17  ALT 11  ALKPHOS 87  BILITOT 0.5  PROT 6.3*  ALBUMIN 3.3*   No results for input(s): "LIPASE", "AMYLASE" in the last 168 hours. No results for input(s): "AMMONIA" in the last 168 hours. Coagulation Profile: Recent Labs  Lab 02/07/23 1933  INR 1.2   Urine analysis:    Component Value Date/Time   COLORURINE YELLOW (A) 02/07/2023 2047   APPEARANCEUR CLOUDY (A) 02/07/2023 2047   LABSPEC 1.012 02/07/2023 2047   PHURINE 5.0 02/07/2023 2047   GLUCOSEU NEGATIVE 02/07/2023 2047   HGBUR SMALL (A) 02/07/2023 2047   BILIRUBINUR NEGATIVE 02/07/2023 2047   BILIRUBINUR negative 10/18/2021 1122   BILIRUBINUR neg 07/02/2020 1221   KETONESUR NEGATIVE 02/07/2023 2047   PROTEINUR 30 (A) 02/07/2023 2047   UROBILINOGEN 0.2 10/18/2021 1122   UROBILINOGEN 4.0 (H) 08/03/2010 1411   NITRITE NEGATIVE 02/07/2023 2047   LEUKOCYTESUR LARGE (A) 02/07/2023 2047    Radiological Exams on  Admission: CT Cervical Spine Wo Contrast  Result  Date: 02/07/2023 CLINICAL DATA:  Neck trauma.  Fall. EXAM: CT CERVICAL SPINE WITHOUT CONTRAST TECHNIQUE: Multidetector CT imaging of the cervical spine was performed without intravenous contrast. Multiplanar CT image reconstructions were also generated. RADIATION DOSE REDUCTION: This exam was performed according to the departmental dose-optimization program which includes automated exposure control, adjustment of the mA and/or kV according to patient size and/or use of iterative reconstruction technique. COMPARISON:  MRI 12/25/2016 FINDINGS: Alignment: Loss of cervical lordosis. 3 mm of anterolisthesis of C3 on C4 related to facet disease. Skull base and vertebrae: Mild compression fracture involving the T1 vertebral body. No additional fracture seen. No focal bone lesion. Soft tissues and spinal canal: No prevertebral fluid or swelling. No visible canal hematoma. Disc levels: Diffuse degenerative disc disease, most pronounced from C4-5 through C6-7. Moderate bilateral degenerative facet disease, left greater than right. Upper chest: No acute findings Other: None IMPRESSION: Mild compression fracture involving the T1 vertebral body, age indeterminate but new since 2018. Degenerative disc and facet disease. Electronically Signed   By: Charlett Nose M.D.   On: 02/07/2023 21:48   CT CHEST ABDOMEN PELVIS W CONTRAST  Result Date: 02/07/2023 CLINICAL DATA:  Polytrauma, blunt fall, hemodynamically unstable EXAM: CT CHEST, ABDOMEN, AND PELVIS WITH CONTRAST TECHNIQUE: Multidetector CT imaging of the chest, abdomen and pelvis was performed following the standard protocol during bolus administration of intravenous contrast. RADIATION DOSE REDUCTION: This exam was performed according to the departmental dose-optimization program which includes automated exposure control, adjustment of the mA and/or kV according to patient size and/or use of iterative reconstruction technique.  CONTRAST:  OMNIPAQUE IOHEXOL 300 MG/ML  SOLN COMPARISON:  07/13/2017 FINDINGS: CT CHEST FINDINGS Cardiovascular: Heart is mildly enlarged. Aorta normal caliber. No aneurysm or dissection. Mediastinum/Nodes: No mediastinal, hilar, or axillary adenopathy. Trachea and esophagus are unremarkable. Thyroid unremarkable. Lungs/Pleura: Linear areas of scarring throughout the lungs bilaterally. No confluent opacities, effusions or pneumothorax. Musculoskeletal: Bilateral calcified breast implants. Vertebroplasty changes at T12. mild wedged appearance of the T1 vertebral body could reflect mild compression fracture. CT ABDOMEN PELVIS FINDINGS Hepatobiliary: No focal hepatic abnormality. Gallbladder unremarkable. Pancreas: No focal abnormality or ductal dilatation. Fatty replacement. Spleen: No splenic injury or perisplenic hematoma. Adrenals/Urinary Tract: No adrenal hemorrhage or renal injury identified. Urinary bladder appears thick walled. This could reflect cystitis. Stomach/Bowel: Sigmoid diverticulosis. No active diverticulitis. Stomach and small bowel decompressed, unremarkable. Vascular/Lymphatic: No evidence of aneurysm or adenopathy. Reproductive: 2 Prior hysterectomy.  No adnexal masses. Other: No free fluid or free air. Musculoskeletal: Mild compression fracture involving the L4 vertebral body, stable since 2018. No acute fracture. Degenerative disc and facet disease throughout the lumbar spine. IMPRESSION: Mild wedged appearance of the T1 vertebral body may reflect mild compression fracture. Otherwise no evidence of significant traumatic injury in chest, abdomen or pelvis. Cardiomegaly. Areas of scarring throughout the lungs bilaterally. Sigmoid diverticulosis. Bladder wall thickening which could reflect cystitis. Recommend correlation with urinalysis. Electronically Signed   By: Charlett Nose M.D.   On: 02/07/2023 21:47   CT Head Wo Contrast  Result Date: 02/07/2023 CLINICAL DATA:  Fall EXAM: CT HEAD  WITHOUT CONTRAST TECHNIQUE: Contiguous axial images were obtained from the base of the skull through the vertex without intravenous contrast. RADIATION DOSE REDUCTION: This exam was performed according to the departmental dose-optimization program which includes automated exposure control, adjustment of the mA and/or kV according to patient size and/or use of iterative reconstruction technique. COMPARISON:  09/09/2020 FINDINGS: Brain: Advanced atrophy. Chronic small vessel disease throughout the  deep white matter. 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 No acute intracranial abnormality. Specifically, no hemorrhage, hydrocephalus, mass lesion, acute infarction, or significant intracranial injury. Vascular: No hyperdense vessel or unexpected calcification. Skull: No acute calvarial abnormality. 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 Atrophy, chronic microvascular disease. No acute intracranial abnormality. Electronically Signed   By: Charlett Nose M.D.   On: 02/07/2023 21:35   DG Chest Port 1 View  Result Date: 02/07/2023 CLINICAL DATA:  Questionable sepsis.  Shortness of breath EXAM: PORTABLE CHEST 1 VIEW COMPARISON:  05/21/2022 FINDINGS: Stable enlargement of the cardiomediastinal silhouette. Pulmonary vascular congestion and interstitial coarsening. No focal consolidation, pleural effusion, or pneumothorax. Remote left rib fractures. Breast implants. Right reverse TSA. IMPRESSION: Cardiomegaly with pulmonary vascular congestion. Electronically Signed   By: Minerva Fester M.D.   On: 02/07/2023 19:43     Data Reviewed: Relevant notes from primary care and specialist visits, past discharge summaries as available in EHR, including Care Everywhere. Prior diagnostic testing as pertinent to current admission diagnoses Updated medications and problem lists for reconciliation ED course, including vitals, labs, imaging, treatment  and response to treatment Triage notes, nursing and pharmacy notes and ED provider's notes Notable results as noted in HPI   Assessment and Plan: No notes have been filed under this hospital service. Service: Hospitalist  >>Falls/ Dizziness/ near syncope: Pt presenting with falls and dizziness and syncope event with collapse . Suspect she may have underlying a.fib rvr that may be the culprit.  Pt will need monitoring with BP and medication and PT.  We will also obtain 2 d echo / bubble study. Her chads score is :        ASSESSMENT AND PLAN: Paroxysmal Atrial Fibrillation (ICD10:  I48.0) The patient's CHA2DS2-VASc score is  , indicating a  % annual risk of stroke.  ***      Signed,  Gertha Calkin, MD    02/07/2023 11:52 PM         DVT prophylaxis: Lovenox***  Consults: none***  Advance Care Planning:   Code Status: Prior ***  Family Communication: none***  Disposition Plan: Back to previous home environment  Severity of Illness: {Observation/Inpatient:21159}  Author: Gertha Calkin, MD 02/07/2023 11:52 PM  For on call review [email or web address].

## 2023-02-07 NOTE — ED Provider Notes (Signed)
[facility] Provider Note    Event Date/Time   First MD Initiated Contact with Patient 02/07/23 1859     (approximate)   History   Fall   HPI  [name] is a 77 y.o. female presenting to the emergency department for evaluation after a fall.  Patient reports she went to stand up, became lightheaded and fell.  Denies syncope.  Did hit her head.  Not on anticoagulation.  With EMS she was noted to be in A-fib with RVR, patient denies history of this and no records of this from her facility.  Initial BP with EMS 80/40 with an O2 sat of 88% on room air, does not wear oxygen at baseline.  Patient reports she feels like she has a UTI with dysuria.  She received 500 cc of normal saline with EMS.  She denies chest pain or shortness of breath.     Physical Exam   Triage Vital Signs: ED Triage Vitals  Enc Vitals Group     BP 02/07/23 1910 (!) 94/49     Pulse Rate 02/07/23 1910 (!) 124     Resp 02/07/23 1910 12     Temp 02/07/23 1910 98.7 F (37.1 C)     Temp Source 02/07/23 1910 Oral     SpO2 02/07/23 1855 96 %     Weight 02/07/23 1912 224 lb 13.9 oz (102 kg)     Height 02/07/23 1912 5\' 5"  (1.651 m)     Head Circumference --      Peak Flow --      Pain Score 02/07/23 1912 8     Pain Loc --      Pain Edu? --      Excl. in GC? --     Most recent vital signs: Vitals:   02/08/23 2245 02/08/23 2300  BP:  113/77  Pulse:    Resp: (!) 23 20  Temp:    SpO2: 96% 96%     General: Awake, interactive  CV:  Regular rate, 1+ radial pulses bilaterally  Resp:  Lungs clear, unlabored respirations.  Abd:  Soft, nondistended.  Neuro:  Symmetric facial movement, fluid speech   ED Results / Procedures / Treatments   Labs (all labs ordered are listed, but only abnormal results are displayed) Labs Reviewed  COMPREHENSIVE METABOLIC PANEL - Abnormal; Notable for the following components:      Result Value   Potassium 3.4 (*)    Glucose, Bld 52 (*)     Creatinine, Ser 1.07 (*)    Calcium 7.8 (*)    Total Protein 6.3 (*)    Albumin 3.3 (*)    GFR, Estimated 53 (*)    All other components within normal limits  CBC WITH DIFFERENTIAL/PLATELET - Abnormal; Notable for the following components:   Hemoglobin 11.4 (*)    RDW 16.6 (*)    nRBC 0.4 (*)    All other components within normal limits  PROTIME-INR - Abnormal; Notable for the following components:   Prothrombin Time 15.5 (*)    All other components within normal limits  URINALYSIS, W/ REFLEX TO CULTURE (INFECTION SUSPECTED) - Abnormal; Notable for the following components:   Color, Urine YELLOW (*)    APPearance CLOUDY (*)    Hgb urine dipstick SMALL (*)    Protein, ur 30 (*)    Leukocytes,Ua LARGE (*)    Bacteria, UA RARE (*)    All other components within normal limits  BRAIN NATRIURETIC  PEPTIDE - Abnormal; Notable for the following components:   B Natriuretic Peptide 804.3 (*)    All other components within normal limits  COMPREHENSIVE METABOLIC PANEL - Abnormal; Notable for the following components:   Potassium 3.3 (*)    Calcium 7.9 (*)    Total Protein 5.8 (*)    Albumin 2.9 (*)    All other components within normal limits  CBC - Abnormal; Notable for the following components:   Hemoglobin 10.9 (*)    HCT 35.1 (*)    RDW 16.7 (*)    Platelets 144 (*)    All other components within normal limits  CULTURE, BLOOD (ROUTINE X 2)  CULTURE, BLOOD (ROUTINE X 2)  URINE CULTURE  LACTIC ACID, PLASMA  APTT  MAGNESIUM  HEPARIN LEVEL (UNFRACTIONATED)  HEPARIN LEVEL (UNFRACTIONATED)  CBC WITH DIFFERENTIAL/PLATELET  COMPREHENSIVE METABOLIC PANEL  HEPARIN LEVEL (UNFRACTIONATED)  TROPONIN I (HIGH SENSITIVITY)  TROPONIN I (HIGH SENSITIVITY)     EKG EKG independently reviewed interpreted by myself (ER attending) demonstrates:  EKG demonstrates A-fib at a rate of 134, PR 94, QTc 502, no acute ST changes  RADIOLOGY Imaging independently reviewed and interpreted by myself  demonstrates:  CXR with pulmonary edema, no obvious focal consolidation  PROCEDURES:  Critical Care performed: Yes, see critical care procedure note(s)  CRITICAL CARE Performed by: Trinna Post   Total critical care time: 42 minutes  Critical care time was exclusive of separately billable procedures and treating other patients.  Critical care was necessary to treat or prevent imminent or life-threatening deterioration.  Critical care was time spent personally by me on the following activities: development of treatment plan with patient and/or surrogate as well as nursing, discussions with consultants, evaluation of patient's response to treatment, examination of patient, obtaining history from patient or surrogate, ordering and performing treatments and interventions, ordering and review of laboratory studies, ordering and review of radiographic studies, pulse oximetry and re-evaluation of patient's condition.   Procedures   MEDICATIONS ORDERED IN ED: Medications  cefTRIAXone (ROCEPHIN) 2 g in sodium chloride 0.9 % 100 mL IVPB (0 g Intravenous Stopped 02/08/23 2325)  heparin ADULT infusion 100 units/mL (25000 units/221mL) (1,250 Units/hr Intravenous Rate/Dose Verify 02/08/23 2156)  sodium chloride flush (NS) 0.9 % injection 3 mL (3 mLs Intravenous Given 02/08/23 2156)  acetaminophen (TYLENOL) tablet 650 mg (has no administration in time range)    Or  acetaminophen (TYLENOL) suppository 650 mg (has no administration in time range)  HYDROcodone-acetaminophen (NORCO/VICODIN) 5-325 MG per tablet 1 tablet (1 tablet Oral Given 02/08/23 0324)  morphine (PF) 2 MG/ML injection 2 mg (has no administration in time range)  sodium chloride flush (NS) 0.9 % injection 3 mL (3 mLs Intravenous Given 02/08/23 2155)  sodium chloride flush (NS) 0.9 % injection 3 mL (has no administration in time range)  0.9 %  sodium chloride infusion (has no administration in time range)  diltiazem (CARDIZEM) tablet 60 mg (60 mg  Oral Given 02/08/23 1804)  metoprolol tartrate (LOPRESSOR) tablet 25 mg (25 mg Oral Given 02/08/23 2142)  oxyCODONE (Oxy IR/ROXICODONE) immediate release tablet 10 mg (10 mg Oral Given 02/08/23 2143)  furosemide (LASIX) tablet 40 mg (40 mg Oral Given 02/08/23 1556)  QUEtiapine (SEROQUEL XR) 24 hr tablet 50 mg (50 mg Oral Given 02/08/23 2142)  ramelteon (ROZEREM) tablet 8 mg (8 mg Oral Given 02/08/23 2141)  traZODone (DESYREL) tablet 50-150 mg (has no administration in time range)  vortioxetine HBr (TRINTELLIX) tablet 20 mg (20 mg Oral Given  02/08/23 1756)  levothyroxine (SYNTHROID) tablet 100 mcg (100 mcg Oral Given 02/08/23 1554)  hyoscyamine (LEVSIN) tablet 0.125 mg (0.125 mg Oral Given 02/08/23 2151)  pantoprazole (PROTONIX) EC tablet 40 mg (40 mg Oral Given 02/08/23 1558)  divalproex (DEPAKOTE) DR tablet 125 mg (125 mg Oral Given 02/08/23 2143)  pregabalin (LYRICA) capsule 100 mg (100 mg Oral Given 02/08/23 2142)  cholecalciferol (VITAMIN D3) 25 MCG (1000 UNIT) tablet 2,000 Units (2,000 Units Oral Given 02/08/23 1556)  lidocaine (LIDODERM) 5 % 1 patch (1 patch Transdermal Patch Applied 02/08/23 1557)  diltiazem (CARDIZEM) 125 mg in dextrose 5% 125 mL (1 mg/mL) infusion (5 mg/hr Intravenous Rate/Dose Change 02/08/23 1943)  sodium chloride 0.9 % bolus 1,000 mL (0 mLs Intravenous Stopped 02/07/23 2046)  iohexol (OMNIPAQUE) 300 MG/ML solution 100 mL (100 mLs Intravenous Contrast Given 02/07/23 2114)  ceFEPIme (MAXIPIME) 2 g in sodium chloride 0.9 % 100 mL IVPB (0 g Intravenous Stopped 02/07/23 2130)  diltiazem (CARDIZEM) injection 10 mg (10 mg Intravenous Given 02/07/23 2032)  heparin bolus via infusion 4,000 Units (4,000 Units Intravenous Bolus from Bag 02/08/23 0018)  potassium chloride (KLOR-CON) packet 40 mEq (40 mEq Oral Given 02/08/23 0750)  ipratropium-albuterol (DUONEB) 0.5-2.5 (3) MG/3ML nebulizer solution 3 mL (3 mLs Nebulization Given 02/08/23 1156)     IMPRESSION / MDM / ASSESSMENT AND PLAN / ED COURSE  I reviewed the  triage vital signs and the nursing notes.  Broad initial differential including, but not limited to, shock secondary to hypovolemia, septic shock with possible infectious sources including pneumonia, UTI, cardiogenic shock in the setting of arrhythmia, hemorrhagic shock with acute traumatic injury  Patient's presentation is most consistent with acute presentation with potential threat to life or bodily function.  77 year old female presenting after ground-level fall with A-fib with RVR with associated hypotension.  Patient initially hypotensive, but MAP above 65 and patient mentating appropriately.  She is not anticoagulated, with her mental status, do not think there is an indication for cardioversion currently.  She was given additional fluids without significant improvement in her blood pressure.  I did consider that her hypotension may be rate related, so did trial a dose of diltiazem with improvement in her heart rates in the 90s without change in her blood pressure.  Will obtain CT scans to further evaluate for traumatic etiologies.  Blood work  with normal white blood cell count and hemoglobin.  CMP notable for mild hypokalemia, slight increase in creatinine to 1.07.  Lactate normal.  Urinalysis is concerning for infection with large leukocyte esterase , greater than 50 white blood cells, rare bacteria and what appears to be a clean sample.   Following diltiazem bolus, patient did have improvement in her heart rates to the 90s without significant drop in her blood pressure.  Slowly started to have uptrending heart rates, so she was placed on a diltiazem drip.  CT scan with questionable mild T1 compression fracture, patient denying pain at this location.  No other traumatic injuries noted.  Presentation seems most consistent with new onset A-fib with RVR in the setting of UTI.  Patient is not significantly fluid responsive, and her MAP has improved to above 65 with systolics above the 90s, normal  lactate, not reflective of septic shock.  Some concerns for fluid overload on x-Truth [place]olaver, will hold off on further fluids.  Rocephin ordered.  Will reach out to hospitalist team.  Discussed with hospitalist team.  They will transmit admission.      FINAL CLINICAL IMPRESSION(S) /  ED DIAGNOSES   Final diagnoses:  Atrial fibrillation with RVR (HCC)  Acute cystitis without hematuria     Rx / DC Orders   ED Discharge Orders     None        Note:  This document was prepared using Dragon voice recognition software and may include unintentional dictation errors.   Trinna Post, MD 02/08/23 [phone]

## 2023-02-07 NOTE — Hospital Course (Addendum)
77 year old female with a history of atrial fibrillation with rapid ventricular response, hypothyroidism, epilepsy, anxiety and depression, history of recurrent falls on multiple centrally acting medications came in with findings of atrial fibrillation rapid ventricular response was initially seen by cardiology medically treated placed on blood thinners with Eliquis underwent transesophageal echo with cardioversion. She is being treated for diastolic CHF as well has UTI. She does have a history of chronic diarrhea which was also managed on the floors. Initially had some improvement symptomatically was being diuresed on the floor then suddenly developed headaches and was treated with narcotics for this. She had CT head performed which showed subdural hematoma and patient was brought in to medical intensive care unit lethargic unable to protect airway with active intracranial bleeding. Multiple consultants were contacted including neurosurgery took to operating room on 6/14 for subdural evacuation.  Patient converted to end-of-life care on 6/17 and was extubated and comfort care measures ordered.  6/19.  Patient was able to wiggle toes to command.  Opens eyes to sternal rub.  Had periods of apnea.  Asked nursing staff to give morphine a little more often.  Glycopyrrolate dose increased by palliative care. 6/20.  Son noticed periods where she was arching her neck with breathing.  Started morphine drip this morning. 6/21.  Continuing morphine drip was on 4.5 mg/h this morning. 6/22. Patient expired at 6:53am

## 2023-02-07 NOTE — H&P (Signed)
History and Physical    Patient: [name]:[id number] DOB: 1946-07-08 DOA: 02/07/2023 DOS: the patient was seen and examined on 02/08/2023 PCP: Smiley Houseman, NP  Patient coming from: SNF  Chief Complaint:  Chief Complaint  Patient presents with   Fall    HPI: [name] is a 77 y.o. female with medical history significant for seizures, hypothyroidism, depression, cardiac history presented at [facility]. Patient has a history of falls as well .  Today she was transferring from recliner at baseline she is able to ambulate and stand without assistance but today she had pain. No Reports tingling fevers chills headaches blurred vision.  Patient does have some soreness in her right knee as well the right is a deformity of the right ankle Considered high risk for falls.  Hip x-ray is negative for any acute fracture or dislocation right knee that was x-rayed in the emergency room shows tricompartmental degenerative changes neutropenia. 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  Has gone down and blood pressure has improved.  Patient was therefore started on diltiazem drip and white 12 send blood pressure has  Improved.  EMS gave patient 500 cc normal saline.  Patient does not report any shortness of breath.  Review of Systems: As mentioned in the history of present illness. All other systems reviewed and are negative.  Past Medical History:  Diagnosis Date   Anemia    took Fe- orally 2 yrs. ago    Anxiety    panic attacks    Arthritis    shoulders, knees   Cancer (HCC)    skin- preCA   Depression    GERD (gastroesophageal reflux disease)    uses Protonix as needed    Headache(784.0)    Humerus fracture    Right    Hyposmolality and/or hyponatremia 02/27/2013   Hypothyroidism    IBS (irritable bowel syndrome)    Injury of left foot 09/05/2018   Injury of  right hand 04/27/2014   Memory loss    Mental disorder    Migraine    Preoperative examination 02/26/2013   Seizures (HCC)    caused by depression , seritonin seizure - effected short term memory     Shortness of breath    Past Surgical History:  Procedure Laterality Date   ABDOMINAL HYSTERECTOMY     APPENDECTOMY     BACK SURGERY  1990's   BREAST SURGERY  1972   augmentation    CHOLECYSTECTOMY     GASTRECTOMY  1994   Due to ulcer, required multiple revisions.    HERNIA REPAIR     ORIF ANKLE FRACTURE Right 2004   REPLACEMENT TOTAL KNEE     REVERSE SHOULDER ARTHROPLASTY Right 05/16/2013   Procedure: RIGHT HEMI ARTHROPLASTY  VERSES  REVERSE TOTAL SHOULDER ARTHROPLASTY ;  Surgeon: Verlee Rossetti, MD;  Location: [facility] OR;  Service: Orthopedics;  Laterality: Right;   SPINE SURGERY  2005   Lumbar spinal stenosis    STOMACH SURGERY     1/3 resection for obstruction   TONSILLECTOMY     TUBAL LIGATION     Social History:  reports that she has never smoked. She has never used smokeless tobacco. She reports current alcohol use. She reports that she does not use drugs.  Allergies  Allergen Reactions   Bupropion Hcl Other (See Comments)  SEIZURES!!   Other    Penicillins    Tilactase Diarrhea   Azithromycin Rash   Erythromycin Rash    Family History  Problem Relation Age of Onset   Aneurysm Mother    Heart disease Mother    Cancer Father        pancreatic   Stroke Father 64   Hypertension Father    Alcohol abuse Father    Heart disease Maternal Grandfather     Prior to Admission medications   Medication Sig Start Date End Date Taking? Authorizing Provider  aspirin 81 MG chewable tablet Chew 81 mg by mouth daily.    [provider]  cholecalciferol (VITAMIN D3) 25 MCG (1000 UNIT) tablet Take 2,000 Units by mouth daily.    [provider]  divalproex (DEPAKOTE) 125 MG DR tablet Take 125 mg by mouth 2 (two) times daily. 11/15/22   [provider]   Erenumab-aooe (AIMOVIG) 70 MG/ML SOAJ Inject 70 mg into the skin every 30 (thirty) days. 12/06/21   Glean Salvo, NP  furosemide (LASIX) 20 MG tablet Take 1 tablet (20 mg total) by mouth 2 (two) times daily. 03/31/22   Mayer Masker, PA-C  hyoscyamine (LEVSIN) 0.125 MG tablet Take 0.125 mg by mouth 2 (two) times daily. 07/27/22   [provider]  IMODIUM A-D 2 MG tablet Take 2 mg by mouth as needed. 06/28/22   [provider]  levothyroxine (SYNTHROID) 100 MCG tablet TAKE 1 TABLET BY MOUTH DAILY 01/16/22   Abonza, Maritza, PA-C  lidocaine (LIDODERM) 5 % Place 1 patch onto the skin daily. 09/24/22   [provider]  loperamide (IMODIUM A-D) 2 MG tablet Take by mouth. 09/20/22   [provider]  omeprazole (PRILOSEC OTC) 20 MG tablet Take 1 tablet (20 mg total) by mouth daily. 02/01/23 02/01/24  Evon Slack, PA-C  oxyCODONE (ROXICODONE) 15 MG immediate release tablet Take 1 tablet by mouth. May take up to 5 times daily 10/10/18   [provider]  pregabalin (LYRICA) 100 MG capsule Take 1 capsule (100 mg total) by mouth 2 (two) times daily. 10/06/22   Penumalli, Glenford Bayley, MD  QUEtiapine (SEROQUEL XR) 50 MG TB24 24 hr tablet Take 50 mg by mouth at bedtime. 11/17/22   [provider]  ramelteon (ROZEREM) 8 MG tablet Take 1 tablet by mouth daily. 11/26/18   [provider]  SUMAtriptan (IMITREX) 25 MG tablet Take 25 mg by mouth every 2 (two) hours as needed for migraine. May repeat in 2 hours if headache persists or recurs.    [provider]  traZODone (DESYREL) 50 MG tablet Take 50-150 mg at bedtime as needed by mouth for sleep (and may take one additional 50 mg tablet daily AS NEEDED for part of a "headache cocktail").     [provider]  triamcinolone (KENALOG) 0.025 % cream Apply 1 Application topically 2 (two) times daily. 09/11/22   [provider]  venlafaxine (EFFEXOR) 37.5 MG tablet Take 1 tablet (37.5 mg total)  by mouth 2 (two) times daily. 12/06/21   Glean Salvo, NP  vortioxetine HBr (TRINTELLIX) 20 MG TABS tablet Take 20 mg by mouth daily. 07/13/16   [provider]    Physical Exam: Vitals:   02/07/23 2245 02/07/23 2300 02/07/23 2315 02/07/23 2330  BP:  98/72  102/81  Pulse: 68 79 87 (!) 106  Resp: 14 10 12 12   Temp:      TempSrc:  SpO2: 94% 98% 96% 98%  Weight:      Height:       Physical Exam Constitutional:      Appearance: She is not ill-appearing.  HENT:     Head: Normocephalic.     Left Ear: External ear normal.     Nose: Nose normal.  Eyes:     Pupils: Pupils are equal, round, and reactive to light.  Cardiovascular:     Rate and Rhythm: Rhythm irregular.     Pulses: Normal pulses.     Heart sounds: Normal heart sounds.  Pulmonary:     Effort: Pulmonary effort is normal.     Breath sounds: Normal breath sounds.  Abdominal:     General: Bowel sounds are normal.     Palpations: Abdomen is soft.  Musculoskeletal:     Right lower leg: No edema.     Left lower leg: No edema.  Skin:    General: Skin is warm.  Neurological:     General: No focal deficit present.     Mental Status: She is alert and oriented to person, place, and time.  Psychiatric:        Mood and Affect: Mood normal.        Behavior: Behavior normal.     Labs on Admission: I have personally reviewed following labs and imaging studies Results for orders placed or performed during the hospital encounter of 02/07/23 (from the past 24 hour(s))  Lactic acid, plasma     Status: None   Collection Time: 02/07/23  7:33 PM  Result Value Ref Range   Lactic Acid, Venous 1.9 0.5 - 1.9 mmol/L  Comprehensive metabolic panel     Status: Abnormal   Collection Time: 02/07/23  7:33 PM  Result Value Ref Range   Sodium 142 135 - 145 mmol/L   Potassium 3.4 (L) 3.5 - 5.1 mmol/L   Chloride 105 98 - 111 mmol/L   CO2 29 22 - 32 mmol/L   Glucose, Bld 52 (L) 70 - 99 mg/dL   BUN 19 8 - 23 mg/dL    Creatinine, Ser 9.60 (H) 0.44 - 1.00 mg/dL   Calcium 7.8 (L) 8.9 - 10.3 mg/dL   Total Protein 6.3 (L) 6.5 - 8.1 g/dL   Albumin 3.3 (L) 3.5 - 5.0 g/dL   AST 17 15 - 41 U/L   ALT 11 0 - 44 U/L   Alkaline Phosphatase 87 38 - 126 U/L   Total Bilirubin 0.5 0.3 - 1.2 mg/dL   GFR, Estimated 53 (L) >60 mL/min   Anion gap 8 5 - 15  CBC with Differential     Status: Abnormal   Collection Time: 02/07/23  7:33 PM  Result Value Ref Range   WBC 5.2 4.0 - 10.5 K/uL   RBC 4.34 3.87 - 5.11 MIL/uL   Hemoglobin 11.4 (L) 12.0 - 15.0 g/dL   HCT 45.4 09.8 - 11.9 %   MCV 84.1 80.0 - 100.0 fL   MCH 26.3 26.0 - 34.0 pg   MCHC 31.2 30.0 - 36.0 g/dL   RDW 14.7 (H) 82.9 - 56.2 %   Platelets 157 150 - 400 K/uL   nRBC 0.4 (H) 0.0 - 0.2 %   Neutrophils Relative % 59 %   Neutro Abs 3.1 1.7 - 7.7 K/uL   Lymphocytes Relative 24 %   Lymphs Abs 1.2 0.7 - 4.0 K/uL   Monocytes Relative 13 %   Monocytes Absolute 0.7 0.1 - 1.0 K/uL  Eosinophils Relative 3 %   Eosinophils Absolute 0.2 0.0 - 0.5 K/uL   Basophils Relative 1 %   Basophils Absolute 0.0 0.0 - 0.1 K/uL   Immature Granulocytes 0 %   Abs Immature Granulocytes 0.02 0.00 - 0.07 K/uL  Protime-INR     Status: Abnormal   Collection Time: 02/07/23  7:33 PM  Result Value Ref Range   Prothrombin Time 15.5 (H) 11.4 - 15.2 seconds   INR 1.2 0.8 - 1.2  APTT     Status: None   Collection Time: 02/07/23  7:33 PM  Result Value Ref Range   aPTT 29 24 - 36 seconds  Brain natriuretic peptide     Status: Abnormal   Collection Time: 02/07/23  7:33 PM  Result Value Ref Range   B Natriuretic Peptide 804.3 (H) 0.0 - 100.0 pg/mL  Urinalysis, w/ Reflex to Culture (Infection Suspected) -Urine, Clean Catch     Status: Abnormal   Collection Time: 02/07/23  8:47 PM  Result Value Ref Range   Specimen Source URINE, RANDOM    Color, Urine YELLOW (A) YELLOW   APPearance CLOUDY (A) CLEAR   Specific Gravity, Urine 1.012 1.005 - 1.030   pH 5.0 5.0 - 8.0   Glucose, UA NEGATIVE  NEGATIVE mg/dL   Hgb urine dipstick SMALL (A) NEGATIVE   Bilirubin Urine NEGATIVE NEGATIVE   Ketones, ur NEGATIVE NEGATIVE mg/dL   Protein, ur 30 (A) NEGATIVE mg/dL   Nitrite NEGATIVE NEGATIVE   Leukocytes,Ua LARGE (A) NEGATIVE   RBC / HPF 11-20 0 - 5 RBC/hpf   WBC, UA >50 0 - 5 WBC/hpf   Bacteria, UA RARE (A) NONE SEEN   Squamous Epithelial / HPF NONE SEEN 0 - 5 /HPF   WBC Clumps PRESENT   Troponin I (High Sensitivity)     Status: None   Collection Time: 02/07/23 10:27 PM  Result Value Ref Range   Troponin I (High Sensitivity) 9 <18 ng/L    CBC: Recent Labs  Lab 02/07/23 1933  WBC 5.2  NEUTROABS 3.1  HGB 11.4*  HCT 36.5  MCV 84.1  PLT 157   Basic Metabolic Panel: Recent Labs  Lab 02/07/23 1933  NA 142  K 3.4*  CL 105  CO2 29  GLUCOSE 52*  BUN 19  CREATININE 1.07*  CALCIUM 7.8*   GFR: Estimated Creatinine Clearance: 50.5 mL/min (A) (by C-G formula based on SCr of 1.07 mg/dL (H)). Liver Function Tests: Recent Labs  Lab 02/07/23 1933  AST 17  ALT 11  ALKPHOS 87  BILITOT 0.5  PROT 6.3*  ALBUMIN 3.3*   No results for input(s): "LIPASE", "AMYLASE" in the last 168 hours. No results for input(s): "AMMONIA" in the last 168 hours. Coagulation Profile: Recent Labs  Lab 02/07/23 1933  INR 1.2   Urine analysis:    Component Value Date/Time   COLORURINE YELLOW (A) 02/07/2023 2047   APPEARANCEUR CLOUDY (A) 02/07/2023 2047   LABSPEC 1.012 02/07/2023 2047   PHURINE 5.0 02/07/2023 2047   GLUCOSEU NEGATIVE 02/07/2023 2047   HGBUR SMALL (A) 02/07/2023 2047   BILIRUBINUR NEGATIVE 02/07/2023 2047   BILIRUBINUR negative 10/18/2021 1122   BILIRUBINUR neg 07/02/2020 1221   KETONESUR NEGATIVE 02/07/2023 2047   PROTEINUR 30 (A) 02/07/2023 2047   UROBILINOGEN 0.2 10/18/2021 1122   UROBILINOGEN 4.0 (H) 08/03/2010 1411   NITRITE NEGATIVE 02/07/2023 2047   LEUKOCYTESUR LARGE (A) 02/07/2023 2047    Radiological Exams on Admission: CT Cervical Spine Wo  Contrast  Result  Date: 02/07/2023 CLINICAL DATA:  Neck trauma.  Fall. EXAM: CT CERVICAL SPINE WITHOUT CONTRAST TECHNIQUE: Multidetector CT imaging of the cervical spine was performed without intravenous contrast. Multiplanar CT image reconstructions were also generated. RADIATION DOSE REDUCTION: This exam was performed according to the departmental dose-optimization program which includes automated exposure control, adjustment of the mA and/or kV according to patient size and/or use of iterative reconstruction technique. COMPARISON:  MRI 12/25/2016 FINDINGS: Alignment: Loss of cervical lordosis. 3 mm of anterolisthesis of C3 on C4 related to facet disease. Skull base and vertebrae: Mild compression fracture involving the T1 vertebral body. No additional fracture seen. No focal bone lesion. Soft tissues and spinal canal: No prevertebral fluid or swelling. No visible canal hematoma. Disc levels: Diffuse degenerative disc disease, most pronounced from C4-5 through C6-7. Moderate bilateral degenerative facet disease, left greater than right. Upper chest: No acute findings Other: None IMPRESSION: Mild compression fracture involving the T1 vertebral body, age indeterminate but new since 2018. Degenerative disc and facet disease. Electronically Signed   By: Charlett Nose M.D.   On: 02/07/2023 21:48   CT CHEST ABDOMEN PELVIS W CONTRAST  Result Date: 02/07/2023 CLINICAL DATA:  Polytrauma, blunt fall, hemodynamically unstable EXAM: CT CHEST, ABDOMEN, AND PELVIS WITH CONTRAST TECHNIQUE: Multidetector CT imaging of the chest, abdomen and pelvis was performed following the standard protocol during bolus administration of intravenous contrast. RADIATION DOSE REDUCTION: This exam was performed according to the departmental dose-optimization program which includes automated exposure control, adjustment of the mA and/or kV according to patient size and/or use of iterative reconstruction technique. CONTRAST:  OMNIPAQUE  IOHEXOL 300 MG/ML  SOLN COMPARISON:  07/13/2017 FINDINGS: CT CHEST FINDINGS Cardiovascular: Heart is mildly enlarged. Aorta normal caliber. No aneurysm or dissection. Mediastinum/Nodes: No mediastinal, hilar, or axillary adenopathy. Trachea and esophagus are unremarkable. Thyroid unremarkable. Lungs/Pleura: Linear areas of scarring throughout the lungs bilaterally. No confluent opacities, effusions or pneumothorax. Musculoskeletal: Bilateral calcified breast implants. Vertebroplasty changes at T12. mild wedged appearance of the T1 vertebral body could reflect mild compression fracture. CT ABDOMEN PELVIS FINDINGS Hepatobiliary: No focal hepatic abnormality. Gallbladder unremarkable. Pancreas: No focal abnormality or ductal dilatation. Fatty replacement. Spleen: No splenic injury or perisplenic hematoma. Adrenals/Urinary Tract: No adrenal hemorrhage or renal injury identified. Urinary bladder appears thick walled. This could reflect cystitis. Stomach/Bowel: Sigmoid diverticulosis. No active diverticulitis. Stomach and small bowel decompressed, unremarkable. Vascular/Lymphatic: No evidence of aneurysm or adenopathy. Reproductive: 2 Prior hysterectomy.  No adnexal masses. Other: No free fluid or free air. Musculoskeletal: Mild compression fracture involving the L4 vertebral body, stable since 2018. No acute fracture. Degenerative disc and facet disease throughout the lumbar spine. IMPRESSION: Mild wedged appearance of the T1 vertebral body may reflect mild compression fracture. Otherwise no evidence of significant traumatic injury in chest, abdomen or pelvis. Cardiomegaly. Areas of scarring throughout the lungs bilaterally. Sigmoid diverticulosis. Bladder wall thickening which could reflect cystitis. Recommend correlation with urinalysis. Electronically Signed   By: Charlett Nose M.D.   On: 02/07/2023 21:47   CT Head Wo Contrast  Result Date: 02/07/2023 CLINICAL DATA:  Fall EXAM: CT HEAD WITHOUT CONTRAST TECHNIQUE:  Contiguous axial images were obtained from the base of the skull through the vertex without intravenous contrast. RADIATION DOSE REDUCTION: This exam was performed according to the departmental dose-optimization program which includes automated exposure control, adjustment of the mA and/or kV according to patient size and/or use of iterative reconstruction technique. COMPARISON:  09/09/2020 FINDINGS: Brain: Advanced atrophy. Chronic small vessel disease throughout the  deep white matter. 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 No acute intracranial abnormality. Specifically, no hemorrhage, hydrocephalus, mass lesion, acute infarction, or significant intracranial injury. Vascular: No hyperdense vessel or unexpected calcification. Skull: No acute calvarial abnormality. 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 Atrophy, chronic microvascular disease. No acute intracranial abnormality. Electronically Signed   By: Charlett Nose M.D.   On: 02/07/2023 21:35   DG Chest Port 1 View  Result Date: 02/07/2023 CLINICAL DATA:  Questionable sepsis.  Shortness of breath EXAM: PORTABLE CHEST 1 VIEW COMPARISON:  05/21/2022 FINDINGS: Stable enlargement of the cardiomediastinal silhouette. Pulmonary vascular congestion and interstitial coarsening. No focal consolidation, pleural effusion, or pneumothorax. Remote left rib fractures. Breast implants. Right reverse TSA. IMPRESSION: Cardiomegaly with pulmonary vascular congestion. Electronically Signed   By: Minerva Fester M.D.   On: 02/07/2023 19:43     Data Reviewed: Relevant notes from primary care and specialist visits, past discharge summaries as available in EHR, including Care Everywhere. Prior diagnostic testing as pertinent to current admission diagnoses Updated medications and problem lists for reconciliation ED course, including vitals, labs, imaging, treatment and response to  treatment Triage notes, nursing and pharmacy notes and ED provider's notes Notable results as noted in HPI   Assessment and Plan: >>Falls/ Dizziness/ near syncope: Pt presenting with falls and dizziness and syncope event with collapse . Suspect she may have underlying a.fib rvr that may be the culprit.  Pt will need monitoring with BP and medication and PT.  We will also obtain 2 d echo / bubble study. Her chads score is : CHA2DS2/VAS Stroke Risk Points  Current as of 15 minutes ago     6 >= 2 Points: High Risk.    >>A.fib rvr: 2 d echo with bubble study.  Will start pt on heparin protocol for a.fib.  Cardiology consult pt has seen [facility] in past.    >> UTI: Cont rocephin and follow C/S   >> Chronic diarrhea: ? If this is from infectious or inflammatory etiology.  ? GIB or chronic blood loss anemia.  If pt has another BM we will collect stool and send for PCR and get GI involved.    >>BL LE Edema: We will do venous dopplers. Pt also needs CHF eval .    >>Electrolyte abnormality: We will replace and follow.        Signed,  Gertha Calkin, MD    02/08/2023 12:48 AM     DVT prophylaxis:  Heparin gtt.  Consults: Cardiology Dr.Melar.   Advance Care Planning: Full code.  Family Communication: none  Disposition Plan: Back to previous home environment  Severity of Illness: The appropriate patient status for this patient is INPATIENT. Inpatient status is judged to be reasonable and necessary in order to provide the required intensity of service to ensure the patient's safety. The patient's presenting symptoms, physical exam findings, and initial radiographic and laboratory data in the context of their chronic comorbidities is felt to place them at high risk for further clinical deterioration. Furthermore, it is not anticipated that the patient will be medically stable for discharge from the hospital within 2 midnights of admission.   * I certify that at the point of  admission it is my clinical judgment that the patient will require inpatient hospital care spanning beyond 2 midnights from the point of admission due to high intensity of service, high risk for further deterioration and high frequency of surveillance required.*  Author: Eliezer Mccoy  Allena Katz, MD 02/08/2023 12:48 AM  For on call review [email or web address].

## 2023-02-07 NOTE — ED Triage Notes (Addendum)
Pt presents to the ED by [facility] from [facility] nursing facility for unwitnessed fall. Pt does have abrasion by left eye and on left side of lip. Pt does not take a blood thinner. Unsure of LOC. Pt is A&O to self and place. Pt showed a-fib on EMS EKG, but does not have a hx of afib. Initial BP with EMS 80/40. O2 saturation was 88% on RA. BP at time of triage 91/62 (72). Pt does have a strong urine odor.   Pt received bolus NaCl with EMS.

## 2023-02-08 ENCOUNTER — Other Ambulatory Visit (HOSPITAL_COMMUNITY): Payer: Self-pay

## 2023-02-08 ENCOUNTER — Inpatient Hospital Stay: Payer: Medicare Other

## 2023-02-08 ENCOUNTER — Other Ambulatory Visit: Payer: Self-pay

## 2023-02-08 ENCOUNTER — Inpatient Hospital Stay (HOSPITAL_COMMUNITY)
Admit: 2023-02-08 | Discharge: 2023-02-08 | Disposition: A | Discharge status: Home / Self Care | Payer: Medicare Other | Attending: Internal Medicine | Admitting: Internal Medicine

## 2023-02-08 ENCOUNTER — Encounter: Payer: Self-pay | Admitting: Oncology

## 2023-02-08 DIAGNOSIS — Z66 Do not resuscitate: Secondary | ICD-10-CM | POA: Diagnosis present

## 2023-02-08 DIAGNOSIS — R2681 Unsteadiness on feet: Secondary | ICD-10-CM | POA: Diagnosis not present

## 2023-02-08 DIAGNOSIS — D500 Iron deficiency anemia secondary to blood loss (chronic): Secondary | ICD-10-CM | POA: Diagnosis present

## 2023-02-08 DIAGNOSIS — R0600 Dyspnea, unspecified: Secondary | ICD-10-CM | POA: Diagnosis not present

## 2023-02-08 DIAGNOSIS — I959 Hypotension, unspecified: Secondary | ICD-10-CM | POA: Diagnosis not present

## 2023-02-08 DIAGNOSIS — F0393 Unspecified dementia, unspecified severity, with mood disturbance: Secondary | ICD-10-CM | POA: Diagnosis present

## 2023-02-08 DIAGNOSIS — I6200 Nontraumatic subdural hemorrhage, unspecified: Secondary | ICD-10-CM | POA: Diagnosis not present

## 2023-02-08 DIAGNOSIS — M4854XA Collapsed vertebra, not elsewhere classified, thoracic region, initial encounter for fracture: Secondary | ICD-10-CM | POA: Diagnosis present

## 2023-02-08 DIAGNOSIS — S065XAA Traumatic subdural hemorrhage with loss of consciousness status unknown, initial encounter: Secondary | ICD-10-CM | POA: Diagnosis not present

## 2023-02-08 DIAGNOSIS — I5031 Acute diastolic (congestive) heart failure: Secondary | ICD-10-CM | POA: Diagnosis not present

## 2023-02-08 DIAGNOSIS — I6201 Nontraumatic acute subdural hemorrhage: Secondary | ICD-10-CM | POA: Diagnosis not present

## 2023-02-08 DIAGNOSIS — G935 Compression of brain: Secondary | ICD-10-CM | POA: Diagnosis not present

## 2023-02-08 DIAGNOSIS — I4891 Unspecified atrial fibrillation: Secondary | ICD-10-CM | POA: Diagnosis not present

## 2023-02-08 DIAGNOSIS — W19XXXA Unspecified fall, initial encounter: Secondary | ICD-10-CM | POA: Diagnosis present

## 2023-02-08 DIAGNOSIS — E039 Hypothyroidism, unspecified: Secondary | ICD-10-CM | POA: Diagnosis present

## 2023-02-08 DIAGNOSIS — F1120 Opioid dependence, uncomplicated: Secondary | ICD-10-CM | POA: Diagnosis not present

## 2023-02-08 DIAGNOSIS — R600 Localized edema: Secondary | ICD-10-CM | POA: Diagnosis not present

## 2023-02-08 DIAGNOSIS — E669 Obesity, unspecified: Secondary | ICD-10-CM | POA: Diagnosis present

## 2023-02-08 DIAGNOSIS — D696 Thrombocytopenia, unspecified: Secondary | ICD-10-CM | POA: Diagnosis not present

## 2023-02-08 DIAGNOSIS — I499 Cardiac arrhythmia, unspecified: Secondary | ICD-10-CM | POA: Diagnosis not present

## 2023-02-08 DIAGNOSIS — E876 Hypokalemia: Secondary | ICD-10-CM | POA: Diagnosis not present

## 2023-02-08 DIAGNOSIS — N390 Urinary tract infection, site not specified: Secondary | ICD-10-CM | POA: Diagnosis not present

## 2023-02-08 DIAGNOSIS — Z515 Encounter for palliative care: Secondary | ICD-10-CM | POA: Diagnosis not present

## 2023-02-08 DIAGNOSIS — I480 Paroxysmal atrial fibrillation: Secondary | ICD-10-CM | POA: Diagnosis present

## 2023-02-08 DIAGNOSIS — R079 Chest pain, unspecified: Secondary | ICD-10-CM | POA: Diagnosis not present

## 2023-02-08 DIAGNOSIS — S069X0A Unspecified intracranial injury without loss of consciousness, initial encounter: Secondary | ICD-10-CM | POA: Diagnosis not present

## 2023-02-08 DIAGNOSIS — G9341 Metabolic encephalopathy: Secondary | ICD-10-CM | POA: Diagnosis present

## 2023-02-08 DIAGNOSIS — I351 Nonrheumatic aortic (valve) insufficiency: Secondary | ICD-10-CM | POA: Diagnosis not present

## 2023-02-08 DIAGNOSIS — R579 Shock, unspecified: Secondary | ICD-10-CM | POA: Diagnosis not present

## 2023-02-08 DIAGNOSIS — Y92129 Unspecified place in nursing home as the place of occurrence of the external cause: Secondary | ICD-10-CM | POA: Diagnosis not present

## 2023-02-08 DIAGNOSIS — D508 Other iron deficiency anemias: Secondary | ICD-10-CM | POA: Diagnosis not present

## 2023-02-08 DIAGNOSIS — Z4659 Encounter for fitting and adjustment of other gastrointestinal appliance and device: Secondary | ICD-10-CM | POA: Diagnosis not present

## 2023-02-08 DIAGNOSIS — I509 Heart failure, unspecified: Secondary | ICD-10-CM | POA: Diagnosis not present

## 2023-02-08 DIAGNOSIS — I110 Hypertensive heart disease with heart failure: Secondary | ICD-10-CM | POA: Diagnosis present

## 2023-02-08 DIAGNOSIS — I4721 Torsades de pointes: Secondary | ICD-10-CM | POA: Diagnosis not present

## 2023-02-08 DIAGNOSIS — J9690 Respiratory failure, unspecified, unspecified whether with hypoxia or hypercapnia: Secondary | ICD-10-CM | POA: Diagnosis not present

## 2023-02-08 DIAGNOSIS — R4701 Aphasia: Secondary | ICD-10-CM | POA: Diagnosis not present

## 2023-02-08 DIAGNOSIS — R0602 Shortness of breath: Secondary | ICD-10-CM | POA: Diagnosis not present

## 2023-02-08 DIAGNOSIS — G40909 Epilepsy, unspecified, not intractable, without status epilepticus: Secondary | ICD-10-CM | POA: Diagnosis present

## 2023-02-08 DIAGNOSIS — R519 Headache, unspecified: Secondary | ICD-10-CM | POA: Diagnosis not present

## 2023-02-08 DIAGNOSIS — R0902 Hypoxemia: Secondary | ICD-10-CM | POA: Diagnosis not present

## 2023-02-08 DIAGNOSIS — G4489 Other headache syndrome: Secondary | ICD-10-CM | POA: Diagnosis not present

## 2023-02-08 DIAGNOSIS — E871 Hypo-osmolality and hyponatremia: Secondary | ICD-10-CM | POA: Diagnosis not present

## 2023-02-08 DIAGNOSIS — Z7189 Other specified counseling: Secondary | ICD-10-CM | POA: Diagnosis not present

## 2023-02-08 DIAGNOSIS — R29818 Other symptoms and signs involving the nervous system: Secondary | ICD-10-CM | POA: Diagnosis not present

## 2023-02-08 DIAGNOSIS — F03918 Unspecified dementia, unspecified severity, with other behavioral disturbance: Secondary | ICD-10-CM | POA: Diagnosis present

## 2023-02-08 DIAGNOSIS — I340 Nonrheumatic mitral (valve) insufficiency: Secondary | ICD-10-CM | POA: Diagnosis not present

## 2023-02-08 DIAGNOSIS — N3000 Acute cystitis without hematuria: Secondary | ICD-10-CM | POA: Diagnosis present

## 2023-02-08 DIAGNOSIS — I6300 Cerebral infarction due to thrombosis of unspecified precerebral artery: Secondary | ICD-10-CM | POA: Diagnosis not present

## 2023-02-08 DIAGNOSIS — J9601 Acute respiratory failure with hypoxia: Secondary | ICD-10-CM | POA: Diagnosis not present

## 2023-02-08 DIAGNOSIS — N179 Acute kidney failure, unspecified: Secondary | ICD-10-CM | POA: Diagnosis present

## 2023-02-08 DIAGNOSIS — I4819 Other persistent atrial fibrillation: Secondary | ICD-10-CM | POA: Diagnosis present

## 2023-02-08 DIAGNOSIS — R414 Neurologic neglect syndrome: Secondary | ICD-10-CM | POA: Diagnosis not present

## 2023-02-08 LAB — ECHOCARDIOGRAM COMPLETE BUBBLE STUDY
AR max vel: 2.44 cm2
AV Area VTI: 2.32 cm2
AV Area mean vel: 2.14 cm2
AV Mean grad: 3.3 mmHg
AV Peak grad: 6.2 mmHg
Ao pk vel: 1.24 m/s
Area-P 1/2: 4.54 cm2
MV VTI: 2.89 cm2
S' Lateral: 3.3 cm

## 2023-02-08 LAB — COMPREHENSIVE METABOLIC PANEL
ALT: 9 U/L (ref 0–44)
AST: 18 U/L (ref 15–41)
Albumin: 2.9 g/dL — ABNORMAL LOW (ref 3.5–5.0)
Alkaline Phosphatase: 77 U/L (ref 38–126)
Anion gap: 10 (ref 5–15)
BUN: 16 mg/dL (ref 8–23)
CO2: 24 mmol/L (ref 22–32)
Calcium: 7.9 mg/dL — ABNORMAL LOW (ref 8.9–10.3)
Chloride: 106 mmol/L (ref 98–111)
Creatinine, Ser: 0.79 mg/dL (ref 0.44–1.00)
GFR, Estimated: >60 mL/min (ref >60)
Glucose, Bld: 95 mg/dL (ref 70–99)
Potassium: 3.3 mmol/L — ABNORMAL LOW (ref 3.5–5.1)
Sodium: 140 mmol/L (ref 135–145)
Total Bilirubin: 0.5 mg/dL (ref 0.3–1.2)
Total Protein: 5.8 g/dL — ABNORMAL LOW (ref 6.5–8.1)

## 2023-02-08 LAB — CBC
HCT: 35.1 % — ABNORMAL LOW (ref 36.0–46.0)
Hemoglobin: 10.9 g/dL — ABNORMAL LOW (ref 12.0–15.0)
MCH: 26 pg (ref 26.0–34.0)
MCHC: 31.1 g/dL (ref 30.0–36.0)
MCV: 83.8 fL (ref 80.0–100.0)
Platelets: 144 10*3/uL — ABNORMAL LOW (ref 150–400)
RBC: 4.19 MIL/uL (ref 3.87–5.11)
RDW: 16.7 % — ABNORMAL HIGH (ref 11.5–15.5)
WBC: 7.2 10*3/uL (ref 4.0–10.5)
nRBC: 0 % (ref 0.0–0.2)

## 2023-02-08 LAB — TROPONIN I (HIGH SENSITIVITY): Troponin I (High Sensitivity): 10 ng/L (ref <18)

## 2023-02-08 LAB — HEPARIN LEVEL (UNFRACTIONATED)
Heparin Unfractionated: 0.49 IU/mL (ref 0.30–0.70)
Heparin Unfractionated: 0.51 IU/mL (ref 0.30–0.70)

## 2023-02-08 LAB — MAGNESIUM: Magnesium: 1.8 mg/dL (ref 1.7–2.4)

## 2023-02-08 MED ORDER — METOPROLOL TARTRATE 25 MG PO TABS
25.0000 mg | ORAL_TABLET | Freq: Two times a day (BID) | ORAL | Status: DC
  Administered 2023-02-08 (×2): 25 mg via ORAL
  Filled 2023-02-08 (×2): qty 1

## 2023-02-08 MED ORDER — IPRATROPIUM-ALBUTEROL 0.5-2.5 (3) MG/3ML IN SOLN
RESPIRATORY_TRACT | Status: AC
  Administered 2023-02-08: 3 mL via RESPIRATORY_TRACT
  Filled 2023-02-08: qty 3

## 2023-02-08 MED ORDER — ACETAMINOPHEN 325 MG PO TABS
650.0000 mg | ORAL_TABLET | Freq: Four times a day (QID) | ORAL | Status: DC | PRN
  Administered 2023-02-14 (×2): 650 mg via ORAL
  Filled 2023-02-08 (×4): qty 2

## 2023-02-08 MED ORDER — SODIUM CHLORIDE 0.9% FLUSH
3.0000 mL | Freq: Two times a day (BID) | INTRAVENOUS | Status: DC
  Administered 2023-02-08 – 2023-02-11 (×6): 3 mL via INTRAVENOUS

## 2023-02-08 MED ORDER — SODIUM CHLORIDE 0.9% FLUSH: 3.0000 mL | INTRAVENOUS | Status: DC | PRN

## 2023-02-08 MED ORDER — DILTIAZEM HCL-DEXTROSE 125-5 MG/125ML-% IV SOLN (PREMIX)
INTRAVENOUS | Status: AC
  Administered 2023-02-08: 15 mg/h via INTRAVENOUS
  Filled 2023-02-08: qty 125

## 2023-02-08 MED ORDER — MORPHINE SULFATE (PF) 2 MG/ML IV SOLN
2.0000 mg | INTRAVENOUS | Status: DC | PRN
  Administered 2023-02-11 – 2023-02-12 (×3): 2 mg via INTRAVENOUS
  Filled 2023-02-08 (×3): qty 1

## 2023-02-08 MED ORDER — HYDROCODONE-ACETAMINOPHEN 5-325 MG PO TABS
1.0000 | ORAL_TABLET | ORAL | Status: DC | PRN
  Administered 2023-02-08 – 2023-02-12 (×3): 1 via ORAL
  Filled 2023-02-08 (×4): qty 1

## 2023-02-08 MED ORDER — DILTIAZEM HCL-DEXTROSE 125-5 MG/125ML-% IV SOLN (PREMIX): 5.0000 mg/h | INTRAVENOUS | Status: DC

## 2023-02-08 MED ORDER — DILTIAZEM HCL 60 MG PO TABS
60.0000 mg | ORAL_TABLET | Freq: Four times a day (QID) | ORAL | Status: DC
  Administered 2023-02-08 (×3): 60 mg via ORAL
  Filled 2023-02-08 (×3): qty 1

## 2023-02-08 MED ORDER — VORTIOXETINE HBR 5 MG PO TABS
20.0000 mg | ORAL_TABLET | Freq: Every day | ORAL | Status: DC
  Administered 2023-02-08 – 2023-02-16 (×9): 20 mg via ORAL
  Filled 2023-02-08 (×10): qty 4

## 2023-02-08 MED ORDER — QUETIAPINE FUMARATE ER 50 MG PO TB24
50.0000 mg | ORAL_TABLET | Freq: Every day | ORAL | Status: DC
  Administered 2023-02-08 – 2023-02-15 (×8): 50 mg via ORAL
  Filled 2023-02-08 (×9): qty 1

## 2023-02-08 MED ORDER — DIVALPROEX SODIUM 125 MG PO DR TAB
125.0000 mg | DELAYED_RELEASE_TABLET | Freq: Two times a day (BID) | ORAL | Status: DC
  Administered 2023-02-08 – 2023-02-16 (×17): 125 mg via ORAL
  Filled 2023-02-08 (×19): qty 1

## 2023-02-08 MED ORDER — RAMELTEON 8 MG PO TABS
8.0000 mg | ORAL_TABLET | Freq: Every day | ORAL | Status: DC
  Administered 2023-02-08 – 2023-02-15 (×8): 8 mg via ORAL
  Filled 2023-02-08 (×11): qty 1

## 2023-02-08 MED ORDER — PREGABALIN 75 MG PO CAPS
100.0000 mg | ORAL_CAPSULE | Freq: Two times a day (BID) | ORAL | Status: DC
  Administered 2023-02-08 – 2023-02-17 (×18): 100 mg via ORAL
  Filled 2023-02-08 (×14): qty 2
  Filled 2023-02-08: qty 1
  Filled 2023-02-08 (×3): qty 2

## 2023-02-08 MED ORDER — SODIUM CHLORIDE 0.9 % IV SOLN: 250.0000 mL | INTRAVENOUS | Status: DC | PRN

## 2023-02-08 MED ORDER — VITAMIN D 25 MCG (1000 UNIT) PO TABS
2000.0000 [IU] | ORAL_TABLET | Freq: Every day | ORAL | Status: DC
  Administered 2023-02-08 – 2023-02-18 (×11): 2000 [IU] via ORAL
  Filled 2023-02-08 (×11): qty 2

## 2023-02-08 MED ORDER — TRAZODONE HCL 50 MG PO TABS
50.0000 mg | ORAL_TABLET | Freq: Every evening | ORAL | Status: DC | PRN
  Administered 2023-02-12: 50 mg via ORAL
  Administered 2023-02-12: 150 mg via ORAL
  Administered 2023-02-14 – 2023-02-15 (×4): 50 mg via ORAL
  Filled 2023-02-08 (×5): qty 1
  Filled 2023-02-08: qty 3

## 2023-02-08 MED ORDER — PANTOPRAZOLE SODIUM 40 MG PO TBEC
40.0000 mg | DELAYED_RELEASE_TABLET | Freq: Every day | ORAL | Status: DC
  Administered 2023-02-08 – 2023-02-16 (×9): 40 mg via ORAL
  Filled 2023-02-08 (×9): qty 1

## 2023-02-08 MED ORDER — HEPARIN (PORCINE) 25000 UT/250ML-% IV SOLN
1400.0000 [IU]/h | INTRAVENOUS | Status: DC
  Administered 2023-02-08 – 2023-02-11 (×3): 1250 [IU]/h via INTRAVENOUS
  Filled 2023-02-08 (×5): qty 250

## 2023-02-08 MED ORDER — FUROSEMIDE 40 MG PO TABS
40.0000 mg | ORAL_TABLET | Freq: Every day | ORAL | Status: DC
  Administered 2023-02-08: 40 mg via ORAL
  Filled 2023-02-08: qty 1

## 2023-02-08 MED ORDER — ACETAMINOPHEN 650 MG RE SUPP: 650.0000 mg | Freq: Four times a day (QID) | RECTAL | Status: DC | PRN

## 2023-02-08 MED ORDER — LEVOTHYROXINE SODIUM 100 MCG PO TABS
100.0000 ug | ORAL_TABLET | Freq: Every day | ORAL | Status: DC
  Administered 2023-02-08 – 2023-02-16 (×9): 100 ug via ORAL
  Filled 2023-02-08: qty 2
  Filled 2023-02-08: qty 1
  Filled 2023-02-08: qty 2
  Filled 2023-02-08 (×6): qty 1

## 2023-02-08 MED ORDER — OXYCODONE HCL 5 MG PO TABS
10.0000 mg | ORAL_TABLET | Freq: Three times a day (TID) | ORAL | Status: DC
  Administered 2023-02-08 – 2023-02-16 (×23): 10 mg via ORAL
  Filled 2023-02-08 (×23): qty 2

## 2023-02-08 MED ORDER — HYOSCYAMINE SULFATE 0.125 MG PO TABS
0.1250 mg | ORAL_TABLET | Freq: Two times a day (BID) | ORAL | Status: DC
  Administered 2023-02-08: 0.125 mg via ORAL
  Filled 2023-02-08 (×2): qty 1

## 2023-02-08 MED ORDER — POTASSIUM CHLORIDE 20 MEQ PO PACK
40.0000 meq | PACK | Freq: Once | ORAL | Status: AC
  Administered 2023-02-08: 40 meq via ORAL
  Filled 2023-02-08: qty 2

## 2023-02-08 MED ORDER — SODIUM CHLORIDE 0.9% FLUSH
3.0000 mL | Freq: Two times a day (BID) | INTRAVENOUS | Status: DC
  Administered 2023-02-08 – 2023-02-11 (×5): 3 mL via INTRAVENOUS

## 2023-02-08 MED ORDER — IPRATROPIUM-ALBUTEROL 0.5-2.5 (3) MG/3ML IN SOLN: 3.0000 mL | Freq: Once | RESPIRATORY_TRACT | Status: AC

## 2023-02-08 MED ORDER — HEPARIN BOLUS VIA INFUSION
4000.0000 [IU] | Freq: Once | INTRAVENOUS | Status: AC
  Administered 2023-02-08: 4000 [IU] via INTRAVENOUS
  Filled 2023-02-08: qty 4000

## 2023-02-08 MED ORDER — LIDOCAINE 5 % EX PTCH
1.0000 | MEDICATED_PATCH | Freq: Every day | CUTANEOUS | Status: DC
  Administered 2023-02-08 – 2023-02-13 (×5): 1 via TRANSDERMAL
  Filled 2023-02-08 (×10): qty 1

## 2023-02-08 NOTE — Consult Note (Signed)
Cardiology Consultation   Patient ID: [name] MRN: [id number]; DOB: [birth year]  Admit date: 02/07/2023 Date of Consult: 02/08/2023  PCP:  Smiley Houseman, NP   [facility] Providers Cardiologist:  None        Patient Profile:   [name] is a 77 y.o. female with a hx of seizures, hypothyroidism, depression, status post multiple mechanical falls, iron deficiency anemia, anxiety, panic attacks, arthritis, gastroesophageal reflux disease, migraines, who is being seen 02/08/2023 for the evaluation of atrial fibrillation RVR at the request of Dr. Allena Katz.  History of Present Illness:   [name] is a 77 year old female with previously mentioned past medical history.  Since that several emergency department visits since December.  She resides in [facility] assisted living.  She has had several noted falls recently and does have several compression fractures to her back and has been following with neurosurgery who placed her on a back brace.    She presented to the [facility] emergency department on 02/07/2023 from the skilled nursing facility.earlier within the day she was transferring from her recliner and sustained an unwitnessed fall.  The attendant found her still lying in the floor when he came to assist her to the dining hall for breakfast.  She was also noted to have abrasion to her left face and lip.  EKG provided by EMS showed atrial fibrillation the patient was without any history of A-fib.  Initial blood pressure was noted to be 80/40.  EMS reports that she was hypotensive and oxygen saturations were 88% on room air.  She was placed on oxygen therapy 2 L.  Given 500 cc bolus of normal saline.  She arrived to the emergency department states she did not hit her head.  She denies any chest pain or shortness of breath.  She has several complaints about the facility and that she has been going downhill since she has been there she is also having issues with completing physical therapy.   She states that she does not have a history of atrial fibrillation or was ever told that she was in A-fib.  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, atrophy, chronic microvascular disease, no acute intracranial abnormalities;CT cervical spine reveals mild compression fracture involving T1 vertebral body; CT of the chest, abdomen, and pelvis T1 compression fracture, cardiomegaly, areas of scarring throughout the lungs bilaterally, sigmoid diverticulosis, bladder wall thickening that could reflect cystitis recommendation correlation with urinalysis; venous ultrasound bilateral lower extremities is negative for DVT  Medications administered in the emergency department: 1 L normal saline, Cardizem drip, cefepime 2 g IVP, and heparin infusion  Cardiology was consulted for atrial fibrillation RVR   Past Medical History:  Diagnosis Date   Anemia    took Fe- orally 2 yrs. ago    Anxiety    panic attacks    Arthritis    shoulders, knees   Cancer (HCC)    skin- preCA   Depression    GERD (gastroesophageal reflux disease)    uses Protonix as needed    Headache(784.0)    Humerus fracture    Right    Hyposmolality and/or hyponatremia 02/27/2013   Hypothyroidism    IBS (irritable bowel syndrome)  Injury of left foot 09/05/2018   Injury of right hand 04/27/2014   Memory loss    Mental disorder    Migraine    Preoperative examination 02/26/2013   Seizures (HCC)    caused by depression , seritonin seizure - effected short term memory     Shortness of breath     Past Surgical History:  Procedure Laterality Date   ABDOMINAL HYSTERECTOMY     APPENDECTOMY      BACK SURGERY  1990's   BREAST SURGERY  1972   augmentation    CHOLECYSTECTOMY     GASTRECTOMY  1994   Due to ulcer, required multiple revisions.    HERNIA REPAIR     ORIF ANKLE FRACTURE Right 2004   REPLACEMENT TOTAL KNEE     REVERSE SHOULDER ARTHROPLASTY Right 05/16/2013   Procedure: RIGHT HEMI ARTHROPLASTY  VERSES  REVERSE TOTAL SHOULDER ARTHROPLASTY ;  Surgeon: Verlee Rossetti, MD;  Location: [facility] OR;  Service: Orthopedics;  Laterality: Right;   SPINE SURGERY  2005   Lumbar spinal stenosis    STOMACH SURGERY     1/3 resection for obstruction   TONSILLECTOMY     TUBAL LIGATION       Home Medications:  Prior to Admission medications   Medication Sig Start Date End Date Taking? Authorizing Provider  aspirin 81 MG chewable tablet Chew 81 mg by mouth daily.   Yes [provider]  cholecalciferol (VITAMIN D3) 25 MCG (1000 UNIT) tablet Take 2,000 Units by mouth daily.   Yes [provider]  divalproex (DEPAKOTE) 125 MG DR tablet Take 125 mg by mouth 2 (two) times daily. 11/15/22  Yes [provider]  Erenumab-aooe (AIMOVIG) 70 MG/ML SOAJ Inject 70 mg into the skin every 30 (thirty) days. 12/06/21  Yes Glean Salvo, NP  furosemide (LASIX) 40 MG tablet Take 40 mg by mouth daily. 02/07/23  Yes [provider]  hyoscyamine (LEVSIN) 0.125 MG tablet Take 0.125 mg by mouth 2 (two) times daily. 07/27/22  Yes [provider]  IMODIUM A-D 2 MG tablet Take 2 mg by mouth as needed. 06/28/22  Yes [provider]  levothyroxine (SYNTHROID) 100 MCG tablet TAKE 1 TABLET BY MOUTH DAILY 01/16/22  Yes Abonza, Maritza, PA-C  lidocaine (LIDODERM) 5 % Place 1 patch onto the skin daily. 09/24/22  Yes [provider]  nitrofurantoin, macrocrystal-monohydrate, (MACROBID) 100 MG capsule Take 100 mg by mouth 2 (two) times daily. 02/07/23  Yes [provider]  omeprazole (PRILOSEC OTC) 20 MG tablet Take 1 tablet (20 mg total) by mouth daily. 02/01/23  02/01/24 Yes Evon Slack, PA-C  Oxycodone HCl 10 MG TABS Take 1 tablet 4 times a day by oral route as directed for 30 days. 01/03/23  Yes [provider]  pregabalin (LYRICA) 100 MG capsule Take 1 capsule (100 mg total) by mouth 2 (two) times daily. 10/06/22  Yes Penumalli, Glenford Bayley, MD  QUEtiapine (SEROQUEL XR) 50 MG TB24 24 hr tablet Take 50 mg by mouth at bedtime. 11/17/22  Yes [provider]  ramelteon (ROZEREM) 8 MG tablet Take 1 tablet by mouth daily. 11/26/18  Yes [provider]  SUMAtriptan (IMITREX) 25 MG tablet Take 25 mg by mouth every 2 (two) hours as needed for migraine. May repeat in 2 hours if headache persists or recurs.   Yes [provider]  traZODone (DESYREL) 50 MG tablet Take 50-150 mg at bedtime as needed by mouth for sleep (and may take  one additional 50 mg tablet daily AS NEEDED for part of a "headache cocktail").    Yes [provider]  venlafaxine (EFFEXOR) 37.5 MG tablet Take 1 tablet (37.5 mg total) by mouth 2 (two) times daily. 12/06/21  Yes Glean Salvo, NP  vortioxetine HBr (TRINTELLIX) 20 MG TABS tablet Take 20 mg by mouth daily. 07/13/16  Yes [provider]  loperamide (IMODIUM A-D) 2 MG tablet Take by mouth. Patient not taking: Reported on 02/07/2023 09/20/22   [provider]  PRISTIQ 25 MG 24 hr tablet Take 25 mg by mouth every morning. Patient not taking: Reported on 02/07/2023 12/20/22   [provider]  RISPERDAL 0.5 MG tablet Take 0.5 mg by mouth 2 (two) times daily. Patient not taking: Reported on 02/07/2023 12/20/22   [provider]  triamcinolone (KENALOG) 0.025 % cream Apply 1 Application topically 2 (two) times daily. Patient not taking: Reported on 02/07/2023 09/11/22   [provider]    Inpatient Medications: Scheduled Meds:  sodium chloride flush  3 mL Intravenous Q12H   sodium chloride flush  3 mL Intravenous Q12H   Continuous Infusions:  sodium chloride      cefTRIAXone (ROCEPHIN)  IV Stopped (02/07/23 2303)   diltiazem (CARDIZEM) infusion 12.5 mg/hr (02/07/23 2343)   heparin 1,250 Units/hr (02/08/23 0018)   PRN Meds: sodium chloride, acetaminophen **OR** acetaminophen, HYDROcodone-acetaminophen, morphine injection, sodium chloride flush  Allergies:    Allergies  Allergen Reactions   Bupropion Hcl Other (See Comments)    SEIZURES!!   Other    Penicillins    Tilactase Diarrhea   Azithromycin Rash   Erythromycin Rash    Social History:   Social History   Socioeconomic History   Marital status: Married    Spouse name: Not on file   Number of children: 1   Years of education: College   Highest education level: Not on file  Occupational History   Occupation: Retired  Tobacco Use   Smoking status: Never   Smokeless tobacco: Never  Vaping Use   Vaping Use: Never used  Substance and Sexual Activity   Alcohol use: Yes    Comment: rare use of white wine    Drug use: No   Sexual activity: Not on file  Other Topics Concern   Not on file  Social History Narrative   Lives at home with her husband.   2 cups caffeine daily.   Right-handed.   Social Determinants of Health   Financial Resource Strain: Not on file  Food Insecurity: Not on file  Transportation Needs: Not on file  Physical Activity: Not on file  Stress: Not on file  Social Connections: Not on file  Intimate Partner Violence: Not on file    Family History:    Family History  Problem Relation Age of Onset   Aneurysm Mother    Heart disease Mother    Cancer Father        pancreatic   Stroke Father 21   Hypertension Father    Alcohol abuse Father    Heart disease Maternal Grandfather      ROS:  Please see the history of present illness.  Review of Systems  Constitutional:  Positive for malaise/fatigue.  Cardiovascular:  Positive for leg swelling.  Genitourinary:  Positive for dysuria.  Musculoskeletal:  Positive for back pain, falls and joint pain.   Neurological:  Positive for weakness.  Psychiatric/Behavioral:  Positive for memory loss.     All other ROS reviewed  and negative.     Physical Exam/Data:   Vitals:   02/08/23 0617 02/08/23 0618 02/08/23 0635 02/08/23 0700  BP: 99/73  110/87 111/64  Pulse: 76 80 (!) 25 89  Resp:  13 19 19   Temp:      TempSrc:      SpO2: 95% 96% 92% 95%  Weight:      Height:        Intake/Output Summary (Last 24 hours) at 02/08/2023 0849 Last data filed at 02/07/2023 2303 Gross per 24 hour  Intake 1200 ml  Output --  Net 1200 ml      02/07/2023    7:12 PM 02/01/2023    2:57 PM 01/22/2023    6:22 PM  Last 3 Weights  Weight (lbs) 224 lb 13.9 oz 224 lb 13.9 oz 225 lb  Weight (kg) 102 kg 102 kg 102.059 kg     Body mass index is 37.42 kg/m.  General:  Well nourished, well developed, in no acute distress, unkempt HEENT: swollen lip from fall, abrasion to left forehead Neck: unable to assess JVD due to body habitus Vascular: No carotid bruits; Distal pulses 2+ bilaterally Cardiac:  normal S1, S2; IR IR; no murmur  Lungs:  diminished to auscultation bilaterally, no wheezing, rhonchi or rales, respirations are unlabored at rest on 2L O2 via [place]  Abd: soft, nontender, no hepatomegaly  Ext: 1+ edema BLE Musculoskeletal:  No deformities, BUE and BLE strength normal and equal Skin: warm and dry  Neuro:  CNs 2-12 intact, no focal abnormalities noted Psych:  Normal affect   EKG:  The EKG was personally reviewed and demonstrates:  atrial fibrillation rate of 134 Telemetry:  Telemetry was personally reviewed and demonstrates:  rate controlled atrial fibrillation rates 70-80  Relevant CV Studies:  New echocardiogram ordered and pending  TTE 06/04/20 1. Left ventricular ejection fraction, by estimation, is 60 to 65%. The  left ventricle has normal function. The left ventricle has no regional  wall motion abnormalities. There is mild asymmetric left ventricular  hypertrophy of the basal-septal  segment.  Left ventricular diastolic parameters are indeterminate.   2. Right ventricular systolic function is normal. The right ventricular  size is normal. There is normal pulmonary artery systolic pressure.   3. The mitral valve is normal in structure. Trivial mitral valve  regurgitation.   4. The aortic valve is tricuspid. Aortic valve regurgitation is moderate.  No aortic stenosis is present.   5. The inferior vena cava is normal in size with greater than 50%  respiratory variability, suggesting right atrial pressure of 3 mmHg.    Laboratory Data:  High Sensitivity Troponin:   Recent Labs  Lab 02/07/23 2227 02/08/23 0047  TROPONINIHS 9 10     Chemistry Recent Labs  Lab 02/07/23 1933 02/07/23 2224 02/08/23 0513  NA 142  --  140  K 3.4*  --  3.3*  CL 105  --  106  CO2 29  --  24  GLUCOSE 52*  --  95  BUN 19  --  16  CREATININE 1.07*  --  0.79  CALCIUM 7.8*  --  7.9*  MG  --  1.8  --   GFRNONAA 53*  --  >60  ANIONGAP 8  --  10    Recent Labs  Lab 02/07/23 1933 02/08/23 0513  PROT 6.3* 5.8*  ALBUMIN 3.3* 2.9*  AST 17 18  ALT 11 9  ALKPHOS 87 77  BILITOT 0.5 0.5   Lipids  No results for input(s): "CHOL", "TRIG", "HDL", "LABVLDL", "LDLCALC", "CHOLHDL" in the last 168 hours.  Hematology Recent Labs  Lab 02/07/23 1933 02/08/23 0513  WBC 5.2 7.2  RBC 4.34 4.19  HGB 11.4* 10.9*  HCT 36.5 35.1*  MCV 84.1 83.8  MCH 26.3 26.0  MCHC 31.2 31.1  RDW 16.6* 16.7*  PLT 157 144*   Thyroid No results for input(s): "TSH", "FREET4" in the last 168 hours.  BNP Recent Labs  Lab 02/07/23 1933  BNP 804.3*    DDimer No results for input(s): "DDIMER" in the last 168 hours.   Radiology/Studies:  US Venous Img Lower Bilateral (DVT)  Result Date: 02/08/2023 CLINICAL DATA:  Bilateral lower extremity edema. EXAM: BILATERAL LOWER EXTREMITY VENOUS DOPPLER ULTRASOUND TECHNIQUE: Gray-scale sonography with graded compression, as well as color Doppler and duplex ultrasound  were performed to evaluate the lower extremity deep venous systems from the level of the common femoral vein and including the common femoral, femoral, profunda femoral, popliteal and calf veins including the posterior tibial, peroneal and gastrocnemius veins when visible. The superficial great saphenous vein was also interrogated. Spectral Doppler was utilized to evaluate flow at rest and with distal augmentation maneuvers in the common femoral, femoral and popliteal veins. COMPARISON:  None Available. FINDINGS: RIGHT LOWER EXTREMITY Common Femoral Vein: No evidence of thrombus. Normal compressibility, respiratory phasicity and response to augmentation. Saphenofemoral Junction: No evidence of thrombus. Normal compressibility and flow on color Doppler imaging. Profunda Femoral Vein: No evidence of thrombus. Normal compressibility and flow on color Doppler imaging. Femoral Vein: No evidence of thrombus. Normal compressibility, respiratory phasicity and response to augmentation. Popliteal Vein: No evidence of thrombus. Normal compressibility, respiratory phasicity and response to augmentation. Calf Veins: No evidence of thrombus. Normal compressibility and flow on color Doppler imaging. Superficial Great Saphenous Vein: No evidence of thrombus. Normal compressibility. Venous Reflux:  None. Other Findings:  None. LEFT LOWER EXTREMITY Common Femoral Vein: No evidence of thrombus. Normal compressibility, respiratory phasicity and response to augmentation. Saphenofemoral Junction: No evidence of thrombus. Normal compressibility and flow on color Doppler imaging. Profunda Femoral Vein: No evidence of thrombus. Normal compressibility and flow on color Doppler imaging. Femoral Vein: No evidence of thrombus. Normal compressibility, respiratory phasicity and response to augmentation. Popliteal Vein: No evidence of thrombus. Normal compressibility, respiratory phasicity and response to augmentation. Calf Veins: No evidence of  thrombus. Normal compressibility and flow on color Doppler imaging. Superficial Great Saphenous Vein: No evidence of thrombus. Normal compressibility. Venous Reflux:  None. Other Findings:  None. IMPRESSION: No evidence of deep venous thrombosis in either lower extremity. Electronically Signed   By: Alcide Clever M.D.   On: 02/08/2023 02:57   CT Cervical Spine Wo Contrast  Result Date: 02/07/2023 CLINICAL DATA:  Neck trauma.  Fall. EXAM: CT CERVICAL SPINE WITHOUT CONTRAST TECHNIQUE: Multidetector CT imaging of the cervical spine was performed without intravenous contrast. Multiplanar CT image reconstructions were also generated. RADIATION DOSE REDUCTION: This exam was performed according to the departmental dose-optimization program which includes automated exposure control, adjustment of the mA and/or kV according to patient size and/or use of iterative reconstruction technique. COMPARISON:  MRI 12/25/2016 FINDINGS: Alignment: Loss of cervical lordosis. 3 mm of anterolisthesis of C3 on C4 related to facet disease. Skull base and vertebrae: Mild compression fracture involving the T1 vertebral body. No additional fracture seen. No focal bone lesion. Soft tissues and spinal canal: No prevertebral fluid or swelling. No visible canal hematoma. Disc levels: Diffuse degenerative disc disease, most pronounced  from C4-5 through C6-7. Moderate bilateral degenerative facet disease, left greater than right. Upper chest: No acute findings Other: None IMPRESSION: Mild compression fracture involving the T1 vertebral body, age indeterminate but new since 2018. Degenerative disc and facet disease. Electronically Signed   By: Charlett Nose M.D.   On: 02/07/2023 21:48   CT CHEST ABDOMEN PELVIS W CONTRAST  Result Date: 02/07/2023 CLINICAL DATA:  Polytrauma, blunt fall, hemodynamically unstable EXAM: CT CHEST, ABDOMEN, AND PELVIS WITH CONTRAST TECHNIQUE: Multidetector CT imaging of the chest, abdomen and pelvis was performed  following the standard protocol during bolus administration of intravenous contrast. RADIATION DOSE REDUCTION: This exam was performed according to the departmental dose-optimization program which includes automated exposure control, adjustment of the mA and/or kV according to patient size and/or use of iterative reconstruction technique. CONTRAST:  OMNIPAQUE IOHEXOL 300 MG/ML  SOLN COMPARISON:  07/13/2017 FINDINGS: CT CHEST FINDINGS Cardiovascular: Heart is mildly enlarged. Aorta normal caliber. No aneurysm or dissection. Mediastinum/Nodes: No mediastinal, hilar, or axillary adenopathy. Trachea and esophagus are unremarkable. Thyroid unremarkable. Lungs/Pleura: Linear areas of scarring throughout the lungs bilaterally. No confluent opacities, effusions or pneumothorax. Musculoskeletal: Bilateral calcified breast implants. Vertebroplasty changes at T12. mild wedged appearance of the T1 vertebral body could reflect mild compression fracture. CT ABDOMEN PELVIS FINDINGS Hepatobiliary: No focal hepatic abnormality. Gallbladder unremarkable. Pancreas: No focal abnormality or ductal dilatation. Fatty replacement. Spleen: No splenic injury or perisplenic hematoma. Adrenals/Urinary Tract: No adrenal hemorrhage or renal injury identified. Urinary bladder appears thick walled. This could reflect cystitis. Stomach/Bowel: Sigmoid diverticulosis. No active diverticulitis. Stomach and small bowel decompressed, unremarkable. Vascular/Lymphatic: No evidence of aneurysm or adenopathy. Reproductive: 2 Prior hysterectomy.  No adnexal masses. Other: No free fluid or free air. Musculoskeletal: Mild compression fracture involving the L4 vertebral body, stable since 2018. No acute fracture. Degenerative disc and facet disease throughout the lumbar spine. IMPRESSION: Mild wedged appearance of the T1 vertebral body may reflect mild compression fracture. Otherwise no evidence of significant traumatic injury in chest, abdomen or pelvis.  Cardiomegaly. Areas of scarring throughout the lungs bilaterally. Sigmoid diverticulosis. Bladder wall thickening which could reflect cystitis. Recommend correlation with urinalysis. Electronically Signed   By: Charlett Nose M.D.   On: 02/07/2023 21:47   CT Head Wo Contrast  Result Date: 02/07/2023 CLINICAL DATA:  Fall EXAM: CT HEAD WITHOUT CONTRAST TECHNIQUE: Contiguous axial images were obtained from the base of the skull through the vertex without intravenous contrast. RADIATION DOSE REDUCTION: This exam was performed according to the departmental dose-optimization program which includes automated exposure control, adjustment of the mA and/or kV according to patient size and/or use of iterative reconstruction technique. COMPARISON:  09/09/2020 FINDINGS: Brain: Advanced atrophy. 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 No acute intracranial abnormality. Specifically, no hemorrhage, hydrocephalus, mass lesion, acute infarction, or significant intracranial injury. Vascular: No hyperdense vessel or unexpected calcification. Skull: No acute calvarial abnormality. 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 Atrophy, chronic microvascular disease. No acute intracranial abnormality. Electronically Signed   By: Charlett Nose M.D.   On: 02/07/2023 21:35   DG Chest Port 1 View  Result Date: 02/07/2023 CLINICAL DATA:  Questionable sepsis.  Shortness of breath EXAM: PORTABLE CHEST 1 VIEW COMPARISON:  05/21/2022 FINDINGS: Stable enlargement of the cardiomediastinal silhouette. Pulmonary vascular congestion and interstitial coarsening. No focal consolidation, pleural effusion, or pneumothorax. Remote left rib fractures. Breast implants. Right reverse TSA. IMPRESSION: Cardiomegaly with pulmonary  vascular congestion. Electronically Signed   By: Minerva Fester M.D.   On: 02/07/2023  19:43     Assessment and Plan:   New onset atrial fibrillation -found on EKG by EMS, no known history, unknown duration -Patient is asymptomatic -Remains in rate controlled atrial fibrillation this morning on bedside cardiac monitoring -Currently on diltiazem 12.5 mg an hour drip, can be transitioned over to diltiazem 60 mg every 6 hours -Continued on heparin infusion, can be transitioned over to apixaban 5 mg twice daily for CHA2DS2-VASc score of at least 3 for stroke prophylaxis, prior to discharge -Continue on telemetry monitoring  Hypokalemia -Serum potassium 3.3 -Potassium supplementation given -Recommend keeping potassium greater than 4 less than 5 -Daily BMP  Elevated BNP -BNP 804.3 -Previous echocardiogram completed in 2021 revealed an LVEF of 60 to 65%, no regional wall motion abnormalities and trace mitral regurgitation -Patient received a total of 1.5 L of normal saline -Echocardiogram ordered and pending with further recommendations to follow -Recommend restarting patient's home furosemide 40 mg daily -Currently denies any shortness of breath -Maintaining oxygen saturations on 2 L of O2 via nasal cannula, continue to wean and titrate off maintaining sats greater than 92%  Chronic UTI -Urinalysis and urine culture completed -Continued on antibiotics -Monitor urine output -Management per IM  History of multiple falls -Has multiple compression fractures to her back, is followed with neurosurgery, is to be in a brace -Multiple falls is concerning attempt to place her on anticoagulant therapy long-term -May require more managed care versus assisted living  Ambulatory dysfunction -Will need physical therapy -Typically ambulates with a walker -Management per IM   Risk Assessment/Risk Scores:          CHA2DS2-VASc Score = 3   This indicates a 3.2% annual risk of stroke. The patient's score is based upon: CHF History: 0 HTN History: 0 Diabetes History:  0 Stroke History: 0 Vascular Disease History: 0 Age Score: 2 Gender Score: 1         For questions or updates, please contact  [facility] Please consult [email or web address] for contact info under    Signed, Kyleigh Nannini, NP  02/08/2023 8:49 AM

## 2023-02-08 NOTE — Progress Notes (Signed)
ANTICOAGULATION CONSULT NOTE  Pharmacy Consult for Heparin  Indication: atrial fibrillation  Allergies  Allergen Reactions   Bupropion Hcl Other (See Comments)    SEIZURES!!   Other    Penicillins    Tilactase Diarrhea   Azithromycin Rash   Erythromycin Rash    Patient Measurements: Height: 5\' 5"  (165.1 cm) Weight: 102 kg (224 lb 13.9 oz) IBW/kg (Calculated) : 57 Heparin Dosing Weight: 80.5 kg   Vital Signs: Temp: 97.8 F (36.6 C) (06/06 0511) Temp Source: Oral (06/06 0511) BP: 91/60 (06/06 1000) Pulse Rate: 89 (06/06 0700)  Labs: Recent Labs    02/07/23 1933 02/07/23 2227 02/08/23 0047 02/08/23 0513 02/08/23 1016  HGB 11.4*  --   --  10.9*  --   HCT 36.5  --   --  35.1*  --   PLT 157  --   --  144*  --   APTT 29  --   --   --   --   LABPROT 15.5*  --   --   --   --   INR 1.2  --   --   --   --   HEPARINUNFRC  --   --   --   --  0.49  CREATININE 1.07*  --   --  0.79  --   TROPONINIHS  --  9 10  --   --      Estimated Creatinine Clearance: 67.5 mL/min (by C-G formula based on SCr of 0.79 mg/dL).   Medical History: Past Medical History:  Diagnosis Date   Anemia    took Fe- orally 2 yrs. ago    Anxiety    panic attacks    Arthritis    shoulders, knees   Cancer (HCC)    skin- preCA   Depression    GERD (gastroesophageal reflux disease)    uses Protonix as needed    Headache(784.0)    Humerus fracture    Right    Hyposmolality and/or hyponatremia 02/27/2013   Hypothyroidism    IBS (irritable bowel syndrome)    Injury of left foot 09/05/2018   Injury of right hand 04/27/2014   Memory loss    Mental disorder    Migraine    Preoperative examination 02/26/2013   Seizures (HCC)    caused by depression , seritonin seizure - effected short term memory     Shortness of breath     Assessment: Pharmacy consulted to dose heparin in this 77 year old female admitted with AFib.  Per MD, pt is fall risk so will use heparin for anticoag.   No prior  anticoag noted.  CrCl = 50.5 ml/min   6/6 1016 HL 0.49, therapeutic x 1  Goal of Therapy:  Heparin level 0.3-0.7 units/ml Monitor platelets by anticoagulation protocol: Yes   Plan:  Heparin level therapeutic x 2 Continue heparin infusion at rate of 1250 units/hr Recheck heparin level in am Daily CBC while on heparin  Lowella Bandy, PharmD 02/08/2023,12:36 PM

## 2023-02-08 NOTE — Progress Notes (Signed)
ANTICOAGULATION CONSULT NOTE - Initial Consult  Pharmacy Consult for Heparin  Indication: atrial fibrillation  Allergies  Allergen Reactions   Bupropion Hcl Other (See Comments)    SEIZURES!!   Other    Penicillins    Tilactase Diarrhea   Azithromycin Rash   Erythromycin Rash    Patient Measurements: Height: 5\' 5"  (165.1 cm) Weight: 102 kg (224 lb 13.9 oz) IBW/kg (Calculated) : 57 Heparin Dosing Weight: 80.5 kg   Vital Signs: Temp: 98.7 F (37.1 C) (06/05 1910) Temp Source: Oral (06/05 1910) BP: 102/81 (06/05 2330) Pulse Rate: 106 (06/05 2330)  Labs: Recent Labs    02/07/23 1933 02/07/23 2227  HGB 11.4*  --   HCT 36.5  --   PLT 157  --   APTT 29  --   LABPROT 15.5*  --   INR 1.2  --   CREATININE 1.07*  --   TROPONINIHS  --  9    Estimated Creatinine Clearance: 50.5 mL/min (A) (by C-G formula based on SCr of 1.07 mg/dL (H)).   Medical History: Past Medical History:  Diagnosis Date   Anemia    took Fe- orally 2 yrs. ago    Anxiety    panic attacks    Arthritis    shoulders, knees   Cancer (HCC)    skin- preCA   Depression    GERD (gastroesophageal reflux disease)    uses Protonix as needed    Headache(784.0)    Humerus fracture    Right    Hyposmolality and/or hyponatremia 02/27/2013   Hypothyroidism    IBS (irritable bowel syndrome)    Injury of left foot 09/05/2018   Injury of right hand 04/27/2014   Memory loss    Mental disorder    Migraine    Preoperative examination 02/26/2013   Seizures (HCC)    caused by depression , seritonin seizure - effected short term memory     Shortness of breath     Medications:  (Not in a hospital admission)   Assessment: Pharmacy consulted to dose heparin in this 77 year old female admitted with AFib.  Per MD, pt is fall risk so will use heparin for anticoag.   No prior anticoag noted.  CrCl = 50.5 ml/min   Goal of Therapy:  Heparin level 0.3-0.7 units/ml Monitor platelets by anticoagulation  protocol: Yes   Plan:  Give 4000 units bolus x 1 Start heparin infusion at 1250 units/hr Check anti-Xa level in 8 hours and daily while on heparin Continue to monitor H&H and platelets  Mohamedamin Nifong D 02/08/2023,12:26 AM

## 2023-02-08 NOTE — Progress Notes (Signed)
ANTICOAGULATION CONSULT NOTE - Initial Consult  Pharmacy Consult for Heparin  Indication: atrial fibrillation  Allergies  Allergen Reactions   Bupropion Hcl Other (See Comments)    SEIZURES!!   Other    Penicillins    Tilactase Diarrhea   Azithromycin Rash   Erythromycin Rash    Patient Measurements: Height: 5\' 5"  (165.1 cm) Weight: 102 kg (224 lb 13.9 oz) IBW/kg (Calculated) : 57 Heparin Dosing Weight: 80.5 kg   Vital Signs: Temp: 97.8 F (36.6 C) (06/06 0511) Temp Source: Oral (06/06 0511) BP: 91/60 (06/06 1000) Pulse Rate: 89 (06/06 0700)  Labs: Recent Labs    02/07/23 1933 02/07/23 2227 02/08/23 0047 02/08/23 0513 02/08/23 1016  HGB 11.4*  --   --  10.9*  --   HCT 36.5  --   --  35.1*  --   PLT 157  --   --  144*  --   APTT 29  --   --   --   --   LABPROT 15.5*  --   --   --   --   INR 1.2  --   --   --   --   HEPARINUNFRC  --   --   --   --  0.49  CREATININE 1.07*  --   --  0.79  --   TROPONINIHS  --  9 10  --   --      Estimated Creatinine Clearance: 67.5 mL/min (by C-G formula based on SCr of 0.79 mg/dL).   Medical History: Past Medical History:  Diagnosis Date   Anemia    took Fe- orally 2 yrs. ago    Anxiety    panic attacks    Arthritis    shoulders, knees   Cancer (HCC)    skin- preCA   Depression    GERD (gastroesophageal reflux disease)    uses Protonix as needed    Headache(784.0)    Humerus fracture    Right    Hyposmolality and/or hyponatremia 02/27/2013   Hypothyroidism    IBS (irritable bowel syndrome)    Injury of left foot 09/05/2018   Injury of right hand 04/27/2014   Memory loss    Mental disorder    Migraine    Preoperative examination 02/26/2013   Seizures (HCC)    caused by depression , seritonin seizure - effected short term memory     Shortness of breath     Medications:  (Not in a hospital admission)  Assessment: Pharmacy consulted to dose heparin in this 77 year old female admitted with AFib.  Per MD,  pt is fall risk so will use heparin for anticoag.   No prior anticoag noted.  CrCl = 50.5 ml/min   6/6 1016 HL 0.49, therapeutic x 1  Goal of Therapy:  Heparin level 0.3-0.7 units/ml Monitor platelets by anticoagulation protocol: Yes   Plan:  HL therapeutic x 1 Continue heparin infusion at rate of 1250 units/hr Recheck HL in 8 hours to confirm Daily CBC while on heparin  Barrie Folk, PharmD 02/08/2023,10:51 AM

## 2023-02-08 NOTE — TOC Benefit Eligibility Note (Signed)
Patient Product/process development scientist completed.    The patient is currently admitted and upon discharge could be taking Eliquis 5 mg.  The current 30 day co-pay is $149.53 due to being in Coverage Gap (donut hole).   The patient is insured through [employer]   This test claim was processed through [facility]- copay amounts may vary at other pharmacies due to pharmacy/plan contracts, or as the patient moves through the different stages of their insurance plan.  Roland Earl, CPHT Pharmacy Patient Advocate Specialist [facility] Patient Advocate Team Direct Number: [phone]  Fax: [phone]

## 2023-02-08 NOTE — Progress Notes (Signed)
       CROSS COVER NOTE  NAME: [name] MRN: [id number] DOB : 05/29/1946    Concern as stated by nurse / staff   Patient complaining of new onset 3/10 mid-sternal chest pain and associated SOB beginning 20 minutes ago. New 12-lead reviewed by ED provider, found to be unremarkable. Would you like add-on troponin?      Pertinent findings on chart review: Patient admitted post fall at home and found to be in Afib with RVR. On heparin and cardizem infusion. Daily lasix. Potassium 3.3 this am, supplemented. Mag 1.8 on 6/5. No cardiac history documented  Assessment and  Interventions   Assessment: Patient without notable distress. EKG reviewed by me Atrial fib v rate 93, No ischemic changes Patient describes chest pain mid sternal only, no radiation 3/10. Denied prior events but when asked if she noted anything to make ist worse or better she said stress makes it worse. Asked again about prior chest pain episodes, she said it was not like this one.   Patient currently endorsing shortness of breath like she can't get a good breath in but denies increased  pin with breathing.   Plan: Patient repositioned in bed and chest pain as well as shortness of breath resolved Patient instructed to inform nurse immediately if chest pain recurs       Donnie Mesa NP [facility] Cross Cover 7pm-7am - check amion for availability Pager [phone]

## 2023-02-08 NOTE — Progress Notes (Signed)
Progress Note   Patient: [name] NWG:[id number] DOB: 1945/11/02 DOA: 02/07/2023     0 DOS: the patient was seen and examined on 02/08/2023   Brief hospital course: [name] is a 77 y.o. female with medical history significant for seizures, hypothyroidism, depression, cardiac history presented at [facility]. Patient has a history of falls as well .  Today she was transferring from recliner at baseline she is able to ambulate and stand without assistance but today she had pain. No Reports tingling fevers chills headaches blurred vision.  Patient does have some soreness in her right knee as well the right is a deformity of the right ankle Considered high risk for falls.  Hip x-ray is negative for any acute fracture or dislocation right knee that was x-rayed in the emergency room shows tricompartmental degenerative changes neutropenia. 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  Has gone down and blood pressure has improved.  Patient was therefore started on diltiazem drip and white 12 send blood pressure has  Improved.  EMS gave patient 500 cc normal saline.  Patient does not report any shortness of breath.  6/6: Patient has responded to Cardizem infusion heart rate improved.  However patient is hypoxic needing 2 L of oxygen via nasal cannula.  Cardiology weaning Cardizem drip and initiating on p.o. Cardizem and metoprolol tartrate.  Assessment and Plan:  >>Falls/ Dizziness/ near syncope: Pt presenting with falls and dizziness and syncope event with collapse . Suspect she may have underlying a.fib rvr that may be the culprit.  Pt will need monitoring with BP and medication and PT.  We will also obtain 2 d echo / bubble study. Patient has not had any recurrent episodes.  Most likely secondary to borderline blood pressure due to A-fib with RVR   >>A.fib rvr: -Cardiologist  evaluated patient - Patient responding well to Cardizem infusion rate controlled, cardiologist converted to p.o. Cardizem and metoprolol tartrate -Her chads score is : CHA2DS2/VAS Stroke Risk Points  Current as of 15 minutes ago     6 >= 2 Points: High Risk. - Cardiologist advised to continue heparin infusion in the event started discussion about anticoagulation with Eliquis - Echocardiogram currently pending.       >> UTI: Cont rocephin and follow C/S in process     >> Chronic diarrhea: ? If this is from infectious or inflammatory etiology.  ? GIB or chronic blood loss anemia.  If pt has another BM we will collect stool and send for PCR and get GI involved.       >>BL LE Edema: - Echocardiogram ordered, last echocardiogram from 2021 shows preserved ejection fraction We will do venous dopplers.  No evidence of thrombosis -Continue fluid restriction, patient continued a home dose of furosemide. - Monitor echocardiogram to see if right atrial pressures are elevated in that case may consider IV diuresis - Check proBNP     >>Electrolyte abnormality: We will replace and follow.  Potassium 3.3 replenished     Subjective: Seen at bedside today.  Patient denies having any dizziness At this attest to having some mild shortness of breath denies having any chest pain.  Denies having any palpitations.  Physical Exam: Vitals:   02/08/23 1130 02/08/23 1200 02/08/23 1300 02/08/23 1313  BP: (!) 100/55 111/64 135/77   Pulse:      Resp: 19 16 18  Temp:    98 F (36.7 C)  TempSrc:    Oral  SpO2:  97% 97%   Weight:      Height:       Constitutional:      Appearance: She is not ill-appearing.  HENT:     Head: Normocephalic.     Left Ear: External ear normal.     Nose: Nose normal.  Eyes:     Pupils: Pupils are equal, round, and reactive to light.  Cardiovascular:     Rate and Rhythm: Rhythm irregular.     Pulses: Normal pulses.     Heart sounds: Normal heart sounds.   Pulmonary:     Effort: Pulmonary effort is normal.     Breath sounds: Normal breath sounds.  Abdominal:     General: Bowel sounds are normal.     Palpations: Abdomen is soft.  Musculoskeletal:     Right lower leg: No edema.     Left lower leg: No edema.  Skin:    General: Skin is warm.  Neurological:     General: No focal deficit present.     Mental Status: She is alert and oriented to person, place, and time.  Psychiatric:        Mood and Affect: Mood normal.        Behavior: Behavior normal. Data Reviewed:  CMP shows potassium of 3.3  Family Communication: Patient is alert and oriented  Disposition: Status is: Inpatient Remains inpatient appropriate because: A-fib with RVR  Planned Discharge Destination: Home    Time spent: 50 minutes  Author: Harold Hedge, MD 02/08/2023 1:38 PM  For on call review [email or web address].

## 2023-02-08 NOTE — Progress Notes (Signed)
*  PRELIMINARY RESULTS* Echocardiogram 2D Echocardiogram has been performed.  Carolyne Fiscal 02/08/2023, 10:36 AM

## 2023-02-09 ENCOUNTER — Other Ambulatory Visit: Payer: Self-pay

## 2023-02-09 ENCOUNTER — Encounter: Payer: Self-pay | Admitting: Internal Medicine

## 2023-02-09 DIAGNOSIS — R2681 Unsteadiness on feet: Secondary | ICD-10-CM | POA: Diagnosis not present

## 2023-02-09 DIAGNOSIS — D508 Other iron deficiency anemias: Secondary | ICD-10-CM | POA: Diagnosis not present

## 2023-02-09 DIAGNOSIS — I959 Hypotension, unspecified: Secondary | ICD-10-CM

## 2023-02-09 DIAGNOSIS — I4891 Unspecified atrial fibrillation: Secondary | ICD-10-CM | POA: Diagnosis not present

## 2023-02-09 DIAGNOSIS — W19XXXA Unspecified fall, initial encounter: Secondary | ICD-10-CM | POA: Diagnosis not present

## 2023-02-09 LAB — CBC WITH DIFFERENTIAL/PLATELET
Abs Immature Granulocytes: 0.01 10*3/uL (ref 0.00–0.07)
Basophils Absolute: 0 10*3/uL (ref 0.0–0.1)
Basophils Relative: 0 %
Eosinophils Absolute: 0.2 10*3/uL (ref 0.0–0.5)
Eosinophils Relative: 2 %
HCT: 31.9 % — ABNORMAL LOW (ref 36.0–46.0)
Hemoglobin: 10 g/dL — ABNORMAL LOW (ref 12.0–15.0)
Immature Granulocytes: 0 %
Lymphocytes Relative: 29 %
Lymphs Abs: 1.9 10*3/uL (ref 0.7–4.0)
MCH: 26.2 pg (ref 26.0–34.0)
MCHC: 31.3 g/dL (ref 30.0–36.0)
MCV: 83.7 fL (ref 80.0–100.0)
Monocytes Absolute: 0.7 10*3/uL (ref 0.1–1.0)
Monocytes Relative: 10 %
Neutro Abs: 3.9 10*3/uL (ref 1.7–7.7)
Neutrophils Relative %: 59 %
Platelets: 130 10*3/uL — ABNORMAL LOW (ref 150–400)
RBC: 3.81 MIL/uL — ABNORMAL LOW (ref 3.87–5.11)
RDW: 16.7 % — ABNORMAL HIGH (ref 11.5–15.5)
WBC: 6.6 10*3/uL (ref 4.0–10.5)
nRBC: 0 % (ref 0.0–0.2)

## 2023-02-09 LAB — HEPARIN LEVEL (UNFRACTIONATED): Heparin Unfractionated: 0.42 IU/mL (ref 0.30–0.70)

## 2023-02-09 LAB — TSH: TSH: 3.566 u[IU]/mL (ref 0.350–4.500)

## 2023-02-09 LAB — CULTURE, BLOOD (ROUTINE X 2)
Culture: NO GROWTH
Special Requests: ADEQUATE

## 2023-02-09 LAB — COMPREHENSIVE METABOLIC PANEL
ALT: 10 U/L (ref 0–44)
AST: 15 U/L (ref 15–41)
Albumin: 2.8 g/dL — ABNORMAL LOW (ref 3.5–5.0)
Alkaline Phosphatase: 69 U/L (ref 38–126)
Anion gap: 8 (ref 5–15)
BUN: 11 mg/dL (ref 8–23)
CO2: 27 mmol/L (ref 22–32)
Calcium: 8 mg/dL — ABNORMAL LOW (ref 8.9–10.3)
Chloride: 105 mmol/L (ref 98–111)
Creatinine, Ser: 0.94 mg/dL (ref 0.44–1.00)
GFR, Estimated: >60 mL/min (ref >60)
Glucose, Bld: 104 mg/dL — ABNORMAL HIGH (ref 70–99)
Potassium: 3.5 mmol/L (ref 3.5–5.1)
Sodium: 140 mmol/L (ref 135–145)
Total Bilirubin: 0.6 mg/dL (ref 0.3–1.2)
Total Protein: 5.5 g/dL — ABNORMAL LOW (ref 6.5–8.1)

## 2023-02-09 LAB — CBG MONITORING, ED: Glucose-Capillary: 94 mg/dL (ref 70–99)

## 2023-02-09 LAB — URINE CULTURE: Culture: >=100000 — AB

## 2023-02-09 LAB — VALPROIC ACID LEVEL: Valproic Acid Lvl: 21 ug/mL — ABNORMAL LOW (ref 50.0–100.0)

## 2023-02-09 LAB — MAGNESIUM: Magnesium: 1.9 mg/dL (ref 1.7–2.4)

## 2023-02-09 LAB — PHOSPHORUS: Phosphorus: 3.7 mg/dL (ref 2.5–4.6)

## 2023-02-09 MED ORDER — ALBUMIN HUMAN 25 % IV SOLN
25.0000 g | Freq: Once | INTRAVENOUS | Status: AC
  Administered 2023-02-09: 25 g via INTRAVENOUS
  Filled 2023-02-09: qty 100

## 2023-02-09 MED ORDER — LACTATED RINGERS IV BOLUS
250.0000 mL | Freq: Once | INTRAVENOUS | Status: AC
  Administered 2023-02-09: 250 mL via INTRAVENOUS

## 2023-02-09 MED ORDER — POTASSIUM CHLORIDE CRYS ER 20 MEQ PO TBCR
40.0000 meq | EXTENDED_RELEASE_TABLET | Freq: Once | ORAL | Status: AC
  Administered 2023-02-09: 40 meq via ORAL
  Filled 2023-02-09: qty 2

## 2023-02-09 MED ORDER — CEPHALEXIN 500 MG PO CAPS
500.0000 mg | ORAL_CAPSULE | Freq: Two times a day (BID) | ORAL | Status: DC
  Administered 2023-02-09 – 2023-02-12 (×7): 500 mg via ORAL
  Filled 2023-02-09 (×7): qty 1

## 2023-02-09 MED ORDER — HYOSCYAMINE SULFATE 0.125 MG PO TBDP
0.1250 mg | ORAL_TABLET | Freq: Two times a day (BID) | ORAL | Status: DC
  Administered 2023-02-09 – 2023-02-16 (×15): 0.125 mg via ORAL
  Filled 2023-02-09 (×17): qty 1

## 2023-02-09 MED ORDER — SUMATRIPTAN SUCCINATE 50 MG PO TABS
25.0000 mg | ORAL_TABLET | ORAL | Status: DC | PRN
  Administered 2023-02-12 – 2023-02-16 (×9): 25 mg via ORAL
  Filled 2023-02-09 (×14): qty 1

## 2023-02-09 NOTE — Progress Notes (Addendum)
Progress Note   Patient: [name] ZOX:[id number] DOB: [birth year] DOA: 02/07/2023     1 DOS: the patient was seen and examined on 02/09/2023   Brief hospital course: [name] is a 77 y.o. female with medical history significant for seizures, hypothyroidism, depression, cardiac history presented at [facility]. Patient has a history of falls as well .  Today she was transferring from recliner at baseline she is able to ambulate and stand without assistance but today she had pain. No Reports tingling fevers chills headaches blurred vision.  Patient does have some soreness in her right knee as well the right is a deformity of the right ankle Considered high risk for falls.  Hip x-ray is negative for any acute fracture or dislocation right knee that was x-rayed in the emergency room shows tricompartmental degenerative changes neutropenia. 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  Has gone down and blood pressure has improved.  Patient was therefore started on diltiazem drip and white 12 send blood pressure has  Improved.  EMS gave patient 500 cc normal saline.  Patient does not report any shortness of breath.  6/6: Patient has responded to Cardizem infusion heart rate improved.  However patient is hypoxic needing 2 L of oxygen via nasal cannula.  Cardiology weaning Cardizem drip and initiating on p.o. Cardizem and metoprolol tartrate.  Assessment and Plan:  >>Falls/ Dizziness/ near syncope: Pt presenting with falls and dizziness and syncope event with collapse . Suspect she may have underlying a.fib rvr that may be the culprit. Bruise left upper lip, CT head negative Patient has not had any recurrent episodes.  Most likely secondary to borderline blood pressure due to A-fib with RVR PT consulted. Patient resides in assisted living Blood cultures ngtd  >>A.fib  rvr: -Cardiologist evaluated patient --tte with normal EF, mild/mod MR - initially treated with dilt infusion, transitioned to oral dilt/metop --overnight hypotensive to the 70s -Her chads score is : CHA2DS2/VAS Stroke Risk Points  Current as of 15 minutes ago     6 >= 2 Points: High Risk. - continuing heparin - I have paused dilt/metop/lasix given hypotension pending cardiology's eval today. BP runs low at baseline - f/u tsh   >> UTI: Unclear if asymptomatic. Aerococcus growing in culture Ceftriaxone>keflex (reviewed with pharm ID)  >> thrombocytopenia Mild, 130 - monitor, further w/u if continues to drop   >> Chronic diarrhea: ? If this is from infectious or inflammatory etiology.  If pt has another BM we will collect stool and send for testing   >>BL LE Edema: - Echocardiogram ordered, last echocardiogram from 2021 shows preserved ejection fraction  venous dopplers - No evidence of thrombosis. Echo preserved ef, diastolic dysfunction -Continue fluid restriction, patient continued a home dose of furosemide. - holding diuresis for hypotension       Subjective: reports chronic migraine, no other pain, breathing comfortably, no chest pain or palpitations  Physical Exam: Vitals:   02/09/23 0610 02/09/23 0637 02/09/23 0640 02/09/23 0700  BP: (!) 84/56 (!) 79/64 (!) 85/63 (!) 77/53  Pulse:      Resp: 11  11 12   Temp:      TempSrc:      SpO2: 100% 93% 96% 99%  Weight:      Height:       Constitutional:      Appearance: She is not ill-appearing.  HENT:     Head: Normocephalic.     Left Ear: External ear normal.     Nose: Nose normal.  Eyes:     Pupils: Pupils are equal, round, and reactive to light.  Cardiovascular:     Rate and Rhythm: Rhythm irregular.     Pulses: Normal pulses.     Heart sounds: Normal heart sounds.  Pulmonary:     Effort: Pulmonary effort is normal.     Breath sounds: Normal breath sounds.  Abdominal:     General: Bowel sounds are normal.      Palpations: Abdomen is soft.  Musculoskeletal:     Decreased tone throuughout Skin:    General: Skin is warm.  Neurological:     General: No focal deficit present.     Mental Status: She is alert and oriented to person, place. Mild confusion Psychiatric:        Mood and Affect: Mood normal.        Behavior: Behavior normal. Data Reviewed:  CMP shows potassium of 3.3  Family Communication: son updated telephonically 6/7  Disposition: Status is: Inpatient Remains inpatient appropriate because: A-fib with RVR  Planned Discharge Destination: Home    Time spent: 35 minutes  Author: Silvano Bilis, MD 02/09/2023 8:12 AM  For on call review [email or web address].

## 2023-02-09 NOTE — Progress Notes (Signed)
PT Cancellation Note  Patient Details Name: [name] MRN: [id number] DOB: December 09, 1945   Cancelled Treatment:    Reason Eval/Treat Not Completed: Medical issues which prohibited therapy PT orders received, chart reviewed. Per RN, BP low throughout the night and request to hold PT evaluation. Will re-attempt at later date/time as appropriate.  Maylon Peppers, PT, DPT Physical Therapist - [facility]  [facility]    Abeni Finchum A Laderius Valbuena 02/09/2023, 9:51 AM

## 2023-02-09 NOTE — ED Notes (Addendum)
Dr Ashok Pall notified of pt's continued SBP around 80s with a MAP below 65. Per MD will come assess pt

## 2023-02-09 NOTE — Progress Notes (Signed)
ANTICOAGULATION CONSULT NOTE  Pharmacy Consult for Heparin  Indication: atrial fibrillation  Allergies  Allergen Reactions   Bupropion Hcl Other (See Comments)    SEIZURES!!   Other    Penicillins    Tilactase Diarrhea   Azithromycin Rash   Erythromycin Rash    Patient Measurements: Height: 5\' 5"  (165.1 cm) Weight: 102 kg (224 lb 13.9 oz) IBW/kg (Calculated) : 57 Heparin Dosing Weight: 80.5 kg   Vital Signs: Temp: 98.2 F (36.8 C) (06/07 0020) Temp Source: Oral (06/07 0020) BP: 74/53 (06/07 0440) Pulse Rate: 62 (06/07 0155)  Labs: Recent Labs    02/07/23 1933 02/07/23 2227 02/08/23 0047 02/08/23 0513 02/08/23 1016 02/08/23 1841 02/09/23 0434  HGB 11.4*  --   --  10.9*  --   --  10.0*  HCT 36.5  --   --  35.1*  --   --  31.9*  PLT 157  --   --  144*  --   --  130*  APTT 29  --   --   --   --   --   --   LABPROT 15.5*  --   --   --   --   --   --   INR 1.2  --   --   --   --   --   --   HEPARINUNFRC  --   --   --   --  0.49 0.51 0.42  CREATININE 1.07*  --   --  0.79  --   --  0.94  TROPONINIHS  --  9 10  --   --   --   --      Estimated Creatinine Clearance: 57.5 mL/min (by C-G formula based on SCr of 0.94 mg/dL).   Medical History: Past Medical History:  Diagnosis Date   Anemia    took Fe- orally 2 yrs. ago    Anxiety    panic attacks    Arthritis    shoulders, knees   Cancer (HCC)    skin- preCA   Depression    GERD (gastroesophageal reflux disease)    uses Protonix as needed    Headache(784.0)    Humerus fracture    Right    Hyposmolality and/or hyponatremia 02/27/2013   Hypothyroidism    IBS (irritable bowel syndrome)    Injury of left foot 09/05/2018   Injury of right hand 04/27/2014   Memory loss    Mental disorder    Migraine    Preoperative examination 02/26/2013   Seizures (HCC)    caused by depression , seritonin seizure - effected short term memory     Shortness of breath     Assessment: Pharmacy consulted to dose heparin  in this 77 year old female admitted with AFib.  Per MD, pt is fall risk so will use heparin for anticoag.   No prior anticoag noted.  CrCl = 50.5 ml/min   6/6 1016 HL 0.49, therapeutic x 1 6/6 1841 HL 0.51, therapeutic X 2 6/7 0434 HL 0.42, therapeutic X 3   Goal of Therapy:  Heparin level 0.3-0.7 units/ml Monitor platelets by anticoagulation protocol: Yes   Plan:  Heparin level therapeutic x 3 Continue heparin infusion at rate of 1250 units/hr Recheck heparin level in am Daily CBC while on heparin  Marshell Dilauro D, PharmD 02/09/2023,4:55 AM

## 2023-02-09 NOTE — ED Notes (Signed)
Pharmacy contacted for missing meds

## 2023-02-09 NOTE — ED Notes (Signed)
Pt requesting ubrelvy for migraine. Dr Ashok Pall notified

## 2023-02-09 NOTE — ED Notes (Signed)
Pts systolic consistently under 90. Pt with no complaints/symptoms. Cardizem drip discontinued, Jon Billings NP notified. Per Jon Billings, keep checking BP and notify if it doesn't get better.

## 2023-02-09 NOTE — Progress Notes (Addendum)
       CROSS COVER NOTE  NAME: [name] MRN: [id number] DOB : 11-15-1945    Concern as stated by nurse / staff   Drop in heart rate and blood pressure on cardizem drip. Drip is now off     Pertinent findings on chart review: On iv cardiazem to wean to continue with q6h oral dosing,  On depakote for seizure control Hypoalbuminemia present  Assessment and  Interventions   Assessment:  Plan: 250 ml LR fluid bolus x1 - no improvement EKG a fib 65 Qtc 559; cbg 94 Addition 250 ml LR bolus, 25 gm 25% albumin Depakote level and mag added to am lab \  -nurse reported manual BP 86/64 prior to third 250 LR bolus.  NO further interventions planned for her hypotension as she is asymptomatic       Donnie Mesa NP [facility] Cross Cover 7pm-7am - check amion for availability Pager [phone]

## 2023-02-09 NOTE — ED Notes (Signed)
Pt on RA, titrated per Dr Ashok Pall

## 2023-02-09 NOTE — ED Notes (Signed)
Pt placed back on 2 L [place] to maintain SpO2 >92%

## 2023-02-09 NOTE — Progress Notes (Addendum)
Rounding Note    Patient Name: [name] Date of Encounter: 02/09/2023  [facility] Cardiologist: [facility]  Subjective   Was transitioned to diltiazem 60 every 6, metoprolol tartrate 25 twice daily Diltiazem infusion weaned through yesterday afternoon and evening Hypotension overnight, systolic pressure less than 100 at 1 AM, persisted most of the morning Diltiazem metoprolol held, diltiazem infusion stopped Blood pressure remains low 100s systolic, asymptomatic Received pain medication overnight oxycodone, reports she did not sleep well  Inpatient Medications    Scheduled Meds:  cephALEXin  500 mg Oral Q12H   cholecalciferol  2,000 Units Oral Daily   divalproex  125 mg Oral BID   hyoscyamine  0.125 mg Oral BID   levothyroxine  100 mcg Oral Daily   lidocaine  1 patch Transdermal Daily   oxyCODONE  10 mg Oral Q8H   pantoprazole  40 mg Oral Daily   pregabalin  100 mg Oral BID   QUEtiapine  50 mg Oral QHS   ramelteon  8 mg Oral QHS   sodium chloride flush  3 mL Intravenous Q12H   sodium chloride flush  3 mL Intravenous Q12H   vortioxetine HBr  20 mg Oral Daily   Continuous Infusions:  sodium chloride     heparin 1,250 Units/hr (02/09/23 0648)   PRN Meds: sodium chloride, acetaminophen **OR** acetaminophen, HYDROcodone-acetaminophen, morphine injection, sodium chloride flush, SUMAtriptan, traZODone   Vital Signs    Vitals:   02/09/23 0946 02/09/23 1030 02/09/23 1100 02/09/23 1120  BP: 108/65 92/65 94/78  99/72  Pulse: 67     Resp: 16 16 14 14   Temp: 97.6 F (36.4 C)     TempSrc:      SpO2: 93% (!) 89% 96% 100%  Weight:      Height:        Intake/Output Summary (Last 24 hours) at 02/09/2023 1302 Last data filed at 02/08/2023 1714 Gross per 24 hour  Intake 235.7 ml  Output --  Net 235.7 ml      02/07/2023    7:12 PM 02/01/2023    2:57 PM 01/22/2023    6:22 PM  Last 3 Weights  Weight (lbs) 224 lb 13.9 oz 224 lb 13.9 oz 225 lb  Weight (kg) 102  kg 102 kg 102.059 kg      Telemetry    Atrial fibrillation rate in the 60s to 70s- Personally Reviewed  ECG     - Personally Reviewed  Physical Exam   GEN: No acute distress.   Neck: No JVD Cardiac: Irregularly irregular, no murmurs, rubs, or gallops.  Respiratory: Clear to auscultation bilaterally. GI: Soft, nontender, non-distended  MS: No edema; No deformity. Neuro:  Nonfocal  Psych: Normal affect   Labs    High Sensitivity Troponin:   Recent Labs  Lab 02/07/23 2227 02/08/23 0047  TROPONINIHS 9 10     Chemistry Recent Labs  Lab 02/07/23 1933 02/07/23 2224 02/08/23 0513 02/09/23 0434  NA 142  --  140 140  K 3.4*  --  3.3* 3.5  CL 105  --  106 105  CO2 29  --  24 27  GLUCOSE 52*  --  95 104*  BUN 19  --  16 11  CREATININE 1.07*  --  0.79 0.94  CALCIUM 7.8*  --  7.9* 8.0*  MG  --  1.8  --  1.9  PROT 6.3*  --  5.8* 5.5*  ALBUMIN 3.3*  --  2.9* 2.8*  AST 17  --  18 15  ALT 11  --  9 10  ALKPHOS 87  --  77 69  BILITOT 0.5  --  0.5 0.6  GFRNONAA 53*  --  >60 >60  ANIONGAP 8  --  10 8    Lipids No results for input(s): "CHOL", "TRIG", "HDL", "LABVLDL", "LDLCALC", "CHOLHDL" in the last 168 hours.  Hematology Recent Labs  Lab 02/07/23 1933 02/08/23 0513 02/09/23 0434  WBC 5.2 7.2 6.6  RBC 4.34 4.19 3.81*  HGB 11.4* 10.9* 10.0*  HCT 36.5 35.1* 31.9*  MCV 84.1 83.8 83.7  MCH 26.3 26.0 26.2  MCHC 31.2 31.1 31.3  RDW 16.6* 16.7* 16.7*  PLT 157 144* 130*   Thyroid  Recent Labs  Lab 02/09/23 0433  TSH 3.566    BNP Recent Labs  Lab 02/07/23 1933  BNP 804.3*    DDimer No results for input(s): "DDIMER" in the last 168 hours.   Radiology    ECHOCARDIOGRAM COMPLETE BUBBLE STUDY  Result Date: 02/08/2023    ECHOCARDIOGRAM REPORT   Patient Name:   [name] Date of Exam: 02/08/2023 Medical Rec #:  [id number]     Height:       65.0 in Accession #:    [id number]    Weight:       224.9 lb Date of Birth:  Aug 24, 1946     BSA:          2.079 m Patient  Age:    77 years      BP:           111/64 mmHg Patient Gender: F             HR:           98 bpm. Exam Location:  [facility] Procedure: 2D Echo, Cardiac Doppler, Color Doppler and Saline Contrast Bubble            Study Indications:     Atrial Fibrillation  History:         Patient has prior history of Echocardiogram examinations, most                  recent 06/04/2020. CHF, Stroke; Arrythmias:Atrial Fibrillation.  Sonographer:     Mikki Harbor Referring Phys:  [id number] Eliezer Mccoy PATEL Diagnosing Phys: Julien Nordmann MD  Sonographer Comments: Patient is obese. IMPRESSIONS  1. Left ventricular ejection fraction, by estimation, is 60 to 65%. The left ventricle has normal function. The left ventricle has no regional wall motion abnormalities. There is mild left ventricular hypertrophy. Left ventricular diastolic parameters are indeterminate.  2. Right ventricular systolic function is normal. The right ventricular size is normal. There is mildly elevated pulmonary artery systolic pressure.  3. Left atrial size was moderately dilated.  4. The mitral valve is normal in structure. Mild to moderate mitral valve regurgitation. No evidence of mitral stenosis.  5. The aortic valve has an indeterminant number of cusps. Aortic valve regurgitation is mild to moderate. No aortic stenosis is present.  6. The inferior vena cava is normal in size with greater than 50% respiratory variability, suggesting right atrial pressure of 3 mmHg.  7. Agitated saline contrast bubble study was negative, with no evidence of any interatrial shunt. FINDINGS  Left Ventricle: Left ventricular ejection fraction, by estimation, is 60 to 65%. The left ventricle has normal function. The left ventricle has no regional wall motion abnormalities. The left ventricular internal cavity size was normal in size. There is  mild left ventricular hypertrophy.  Left ventricular diastolic parameters are indeterminate. Right Ventricle: The right ventricular size is normal.  No increase in right ventricular wall thickness. Right ventricular systolic function is normal. There is mildly elevated pulmonary artery systolic pressure. The tricuspid regurgitant velocity is 2.71  m/s, and with an assumed right atrial pressure of 8 mmHg, the estimated right ventricular systolic pressure is 37.4 mmHg. Left Atrium: Left atrial size was moderately dilated. Right Atrium: Right atrial size was normal in size. Pericardium: There is no evidence of pericardial effusion. Mitral Valve: The mitral valve is normal in structure. Mild to moderate mitral valve regurgitation. No evidence of mitral valve stenosis. MV peak gradient, 6.6 mmHg. The mean mitral valve gradient is 2.0 mmHg. Tricuspid Valve: The tricuspid valve is normal in structure. Tricuspid valve regurgitation is mild . No evidence of tricuspid stenosis. Aortic Valve: The aortic valve has an indeterminant number of cusps. Aortic valve regurgitation is mild to moderate. No aortic stenosis is present. Aortic valve mean gradient measures 3.3 mmHg. Aortic valve peak gradient measures 6.2 mmHg. Aortic valve area, by VTI measures 2.32 cm. Pulmonic Valve: The pulmonic valve was normal in structure. Pulmonic valve regurgitation is not visualized. No evidence of pulmonic stenosis. Aorta: The aortic root is normal in size and structure. Venous: The inferior vena cava is normal in size with greater than 50% respiratory variability, suggesting right atrial pressure of 3 mmHg. IAS/Shunts: No atrial level shunt detected by color flow Doppler. Agitated saline contrast was given intravenously to evaluate for intracardiac shunting. Agitated saline contrast bubble study was negative, with no evidence of any interatrial shunt. There  is no evidence of a patent foramen ovale. There is no evidence of an atrial septal defect.  LEFT VENTRICLE PLAX 2D LVIDd:         4.70 cm LVIDs:         3.30 cm LV PW:         1.30 cm LV IVS:        1.30 cm LVOT diam:     1.90 cm LV SV:          54 LV SV Index:   26 LVOT Area:     2.84 cm  RIGHT VENTRICLE RV Basal diam:  3.50 cm RV Mid diam:    3.20 cm RV S prime:     10.80 cm/s LEFT ATRIUM             Index        RIGHT ATRIUM           Index LA diam:        4.40 cm 2.12 cm/m   RA Area:     21.30 cm LA Vol (A2C):   85.3 ml 41.03 ml/m  RA Volume:   56.60 ml  27.22 ml/m LA Vol (A4C):   90.9 ml 43.72 ml/m LA Biplane Vol: 91.8 ml 44.15 ml/m  AORTIC VALVE                    PULMONIC VALVE AV Area (Vmax):    2.44 cm     PV Vmax:       0.96 m/s AV Area (Vmean):   2.14 cm     PV Peak grad:  3.7 mmHg AV Area (VTI):     2.32 cm AV Vmax:           124.33 cm/s AV Vmean:          79.533 cm/s AV VTI:  0.231 m AV Peak Grad:      6.2 mmHg AV Mean Grad:      3.3 mmHg LVOT Vmax:         107.00 cm/s LVOT Vmean:        60.100 cm/s LVOT VTI:          0.189 m LVOT/AV VTI ratio: 0.82  AORTA Ao Root diam: 3.70 cm Ao Asc diam:  3.60 cm MITRAL VALVE                TRICUSPID VALVE MV Area (PHT): 4.54 cm     TR Peak grad:   29.4 mmHg MV Area VTI:   2.89 cm     TR Vmax:        271.00 cm/s MV Peak grad:  6.6 mmHg MV Mean grad:  2.0 mmHg     SHUNTS MV Vmax:       1.28 m/s     Systemic VTI:  0.19 m MV Vmean:      69.3 cm/s    Systemic Diam: 1.90 cm MV Decel Time: 167 msec MV E velocity: 106.00 cm/s Julien Nordmann MD Electronically signed by Julien Nordmann MD Signature Date/Time: 02/08/2023/3:26:18 PM    Final    US Venous Img Lower Bilateral (DVT)  Result Date: 02/08/2023 CLINICAL DATA:  Bilateral lower extremity edema. EXAM: BILATERAL LOWER EXTREMITY VENOUS DOPPLER ULTRASOUND TECHNIQUE: Gray-scale sonography with graded compression, as well as color Doppler and duplex ultrasound were performed to evaluate the lower extremity deep venous systems from the level of the common femoral vein and including the common femoral, femoral, profunda femoral, popliteal and calf veins including the posterior tibial, peroneal and gastrocnemius veins when visible. The  superficial great saphenous vein was also interrogated. Spectral Doppler was utilized to evaluate flow at rest and with distal augmentation maneuvers in the common femoral, femoral and popliteal veins. COMPARISON:  None Available. FINDINGS: RIGHT LOWER EXTREMITY Common Femoral Vein: No evidence of thrombus. Normal compressibility, respiratory phasicity and response to augmentation. Saphenofemoral Junction: No evidence of thrombus. Normal compressibility and flow on color Doppler imaging. Profunda Femoral Vein: No evidence of thrombus. Normal compressibility and flow on color Doppler imaging. Femoral Vein: No evidence of thrombus. Normal compressibility, respiratory phasicity and response to augmentation. Popliteal Vein: No evidence of thrombus. Normal compressibility, respiratory phasicity and response to augmentation. Calf Veins: No evidence of thrombus. Normal compressibility and flow on color Doppler imaging. Superficial Great Saphenous Vein: No evidence of thrombus. Normal compressibility. Venous Reflux:  None. Other Findings:  None. LEFT LOWER EXTREMITY Common Femoral Vein: No evidence of thrombus. Normal compressibility, respiratory phasicity and response to augmentation. Saphenofemoral Junction: No evidence of thrombus. Normal compressibility and flow on color Doppler imaging. Profunda Femoral Vein: No evidence of thrombus. Normal compressibility and flow on color Doppler imaging. Femoral Vein: No evidence of thrombus. Normal compressibility, respiratory phasicity and response to augmentation. Popliteal Vein: No evidence of thrombus. Normal compressibility, respiratory phasicity and response to augmentation. Calf Veins: No evidence of thrombus. Normal compressibility and flow on color Doppler imaging. Superficial Great Saphenous Vein: No evidence of thrombus. Normal compressibility. Venous Reflux:  None. Other Findings:  None. IMPRESSION: No evidence of deep venous thrombosis in either lower extremity.  Electronically Signed   By: Alcide Clever M.D.   On: 02/08/2023 02:57   CT Cervical Spine Wo Contrast  Result Date: 02/07/2023 CLINICAL DATA:  Neck trauma.  Fall. EXAM: CT CERVICAL SPINE WITHOUT CONTRAST TECHNIQUE: Multidetector CT imaging of the cervical spine  was performed without intravenous contrast. Multiplanar CT image reconstructions were also generated. RADIATION DOSE REDUCTION: This exam was performed according to the departmental dose-optimization program which includes automated exposure control, adjustment of the mA and/or kV according to patient size and/or use of iterative reconstruction technique. COMPARISON:  MRI 12/25/2016 FINDINGS: Alignment: Loss of cervical lordosis. 3 mm of anterolisthesis of C3 on C4 related to facet disease. Skull base and vertebrae: Mild compression fracture involving the T1 vertebral body. No additional fracture seen. No focal bone lesion. Soft tissues and spinal canal: No prevertebral fluid or swelling. No visible canal hematoma. Disc levels: Diffuse degenerative disc disease, most pronounced from C4-5 through C6-7. Moderate bilateral degenerative facet disease, left greater than right. Upper chest: No acute findings Other: None IMPRESSION: Mild compression fracture involving the T1 vertebral body, age indeterminate but new since 2018. Degenerative disc and facet disease. Electronically Signed   By: Charlett Nose M.D.   On: 02/07/2023 21:48   CT CHEST ABDOMEN PELVIS W CONTRAST  Result Date: 02/07/2023 CLINICAL DATA:  Polytrauma, blunt fall, hemodynamically unstable EXAM: CT CHEST, ABDOMEN, AND PELVIS WITH CONTRAST TECHNIQUE: Multidetector CT imaging of the chest, abdomen and pelvis was performed following the standard protocol during bolus administration of intravenous contrast. RADIATION DOSE REDUCTION: This exam was performed according to the departmental dose-optimization program which includes automated exposure control, adjustment of the mA and/or kV according to  patient size and/or use of iterative reconstruction technique. CONTRAST:  OMNIPAQUE IOHEXOL 300 MG/ML  SOLN COMPARISON:  07/13/2017 FINDINGS: CT CHEST FINDINGS Cardiovascular: Heart is mildly enlarged. Aorta normal caliber. No aneurysm or dissection. Mediastinum/Nodes: No mediastinal, hilar, or axillary adenopathy. Trachea and esophagus are unremarkable. Thyroid unremarkable. Lungs/Pleura: Linear areas of scarring throughout the lungs bilaterally. No confluent opacities, effusions or pneumothorax. Musculoskeletal: Bilateral calcified breast implants. Vertebroplasty changes at T12. mild wedged appearance of the T1 vertebral body could reflect mild compression fracture. CT ABDOMEN PELVIS FINDINGS Hepatobiliary: No focal hepatic abnormality. Gallbladder unremarkable. Pancreas: No focal abnormality or ductal dilatation. Fatty replacement. Spleen: No splenic injury or perisplenic hematoma. Adrenals/Urinary Tract: No adrenal hemorrhage or renal injury identified. Urinary bladder appears thick walled. This could reflect cystitis. Stomach/Bowel: Sigmoid diverticulosis. No active diverticulitis. Stomach and small bowel decompressed, unremarkable. Vascular/Lymphatic: No evidence of aneurysm or adenopathy. Reproductive: 2 Prior hysterectomy.  No adnexal masses. Other: No free fluid or free air. Musculoskeletal: Mild compression fracture involving the L4 vertebral body, stable since 2018. No acute fracture. Degenerative disc and facet disease throughout the lumbar spine. IMPRESSION: Mild wedged appearance of the T1 vertebral body may reflect mild compression fracture. Otherwise no evidence of significant traumatic injury in chest, abdomen or pelvis. Cardiomegaly. Areas of scarring throughout the lungs bilaterally. Sigmoid diverticulosis. Bladder wall thickening which could reflect cystitis. Recommend correlation with urinalysis. Electronically Signed   By: Charlett Nose M.D.   On: 02/07/2023 21:47   CT Head Wo  Contrast  Result Date: 02/07/2023 CLINICAL DATA:  Fall EXAM: CT HEAD WITHOUT CONTRAST TECHNIQUE: Contiguous axial images were obtained from the base of the skull through the vertex without intravenous contrast. RADIATION DOSE REDUCTION: This exam was performed according to the departmental dose-optimization program which includes automated exposure control, adjustment of the mA and/or kV according to patient size and/or use of iterative reconstruction technique. COMPARISON:  09/09/2020 FINDINGS: Brain: Advanced atrophy. 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 No acute intracranial abnormality. Specifically, no hemorrhage, hydrocephalus, mass lesion, acute infarction, or significant  intracranial injury. Vascular: No hyperdense vessel or unexpected calcification. Skull: No acute calvarial abnormality. 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 Atrophy, chronic microvascular disease. No acute intracranial abnormality. Electronically Signed   By: Charlett Nose M.D.   On: 02/07/2023 21:35   DG Chest Port 1 View  Result Date: 02/07/2023 CLINICAL DATA:  Questionable sepsis.  Shortness of breath EXAM: PORTABLE CHEST 1 VIEW COMPARISON:  05/21/2022 FINDINGS: Stable enlargement of the cardiomediastinal silhouette. Pulmonary vascular congestion and interstitial coarsening. No focal consolidation, pleural effusion, or pneumothorax. Remote left rib fractures. Breast implants. Right reverse TSA. IMPRESSION: Cardiomegaly with pulmonary vascular congestion. Electronically Signed   By: Minerva Fester M.D.   On: 02/07/2023 19:43    Cardiac Studies  Echo   1. Left ventricular ejection fraction, by estimation, is 60 to 65%. The  left ventricle has normal function. The left ventricle has no regional  wall motion abnormalities. There is mild left ventricular hypertrophy.  Left  ventricular diastolic parameters  are indeterminate.   2. Right ventricular systolic function is normal. The right ventricular  size is normal. There is mildly elevated pulmonary artery systolic  pressure.   3. Left atrial size was moderately dilated.   4. The mitral valve is normal in structure. Mild to moderate mitral valve  regurgitation. No evidence of mitral stenosis.   5. The aortic valve has an indeterminant number of cusps. Aortic valve  regurgitation is mild to moderate. No aortic stenosis is present.   6. The inferior vena cava is normal in size with greater than 50%  respiratory variability, suggesting right atrial pressure of 3 mmHg.   7. Agitated saline contrast bubble study was negative, with no evidence  of any interatrial shunt.    Patient Profile     Ms. Jwan Yell is a 77 year old woman with history of overt obesity, seizures, hypothyroidism, mechanical falls, iron deficiency anemia, anxiety/panic attacks, presenting from [facility] assisted facility after a fall Long discussion with her concerning home situation, she reports independent living, some assistance from staff and son.  Periodic falls, legs have been getting weaker.  This admission was transferring from couch to bed and she was not using her walker and went down, trauma to left lip.  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 CT chest abdomen pelvis T1 compression fracture, cardiomegaly No DVT  Assessment & Plan    Atrial fibrillation with RVR Prior EKGs dating back to September 2023, normal sinus rhythm at that time Timing of onset of atrial fibrillation unclear, possibly from  mechanical fall this admission Initially placed on diltiazem infusion with improved rate control Yesterday transition to diltiazem 60 every 6 with metoprolol tartrate 25 twice daily Medications held this morning for hypotension Was also given pain medication overnight which may have contributed As blood pressure stabilizes, will look to restart low-dose metoprolol  tartrate for rate control CHA2DS2-VASc score of at least 3  Continue heparin infusion for now until she has a chance to work with PT Watching hemoglobin closely given slow decline We will look to transition to Eliquis if hemoglobin stable at discharge with close monitoring given history of falls   Iron deficiency anemia  Recent trend downward in her hemoglobin, 10. down from 12.3 two weeks ago Would need to monitor closely on heparin/Eliquis  as outpatient  Hypotension Likely secondary to polypharmacy including diltiazem, metoprolol, pain medication Uncertain if urinary tract infection contributing Blood pressure low but stabilized earlier this afternoon   Mechanical falls/leg weakness Reports worsening leg weakness during her time spent at [facility]   was transitioning from couch to bed without her walker Needs evaluation by PT to determine if she needs assisted living versus independent living as she lives currently   Total encounter time more than 50 minutes  Greater than 50% was spent in counseling and coordination of care with the patient   For questions or updates, please contact  [facility] Please consult [email or web address] for contact info under        Signed, Julien Nordmann, MD  02/09/2023, 1:02 PM

## 2023-02-09 NOTE — ED Notes (Signed)
This RN and Gena Fray in to clean pt up and change brief. Pt with some redness to right side of butt. Pillow was placed underneath buttock to relieve pressure on that side

## 2023-02-10 DIAGNOSIS — N390 Urinary tract infection, site not specified: Secondary | ICD-10-CM

## 2023-02-10 DIAGNOSIS — I959 Hypotension, unspecified: Secondary | ICD-10-CM | POA: Diagnosis not present

## 2023-02-10 DIAGNOSIS — I480 Paroxysmal atrial fibrillation: Secondary | ICD-10-CM

## 2023-02-10 DIAGNOSIS — R2681 Unsteadiness on feet: Secondary | ICD-10-CM | POA: Diagnosis not present

## 2023-02-10 DIAGNOSIS — W19XXXA Unspecified fall, initial encounter: Secondary | ICD-10-CM | POA: Diagnosis not present

## 2023-02-10 DIAGNOSIS — I4891 Unspecified atrial fibrillation: Secondary | ICD-10-CM | POA: Diagnosis not present

## 2023-02-10 DIAGNOSIS — D508 Other iron deficiency anemias: Secondary | ICD-10-CM | POA: Diagnosis not present

## 2023-02-10 LAB — BASIC METABOLIC PANEL
Anion gap: 5 (ref 5–15)
BUN: 10 mg/dL (ref 8–23)
CO2: 29 mmol/L (ref 22–32)
Calcium: 8.5 mg/dL — ABNORMAL LOW (ref 8.9–10.3)
Chloride: 103 mmol/L (ref 98–111)
Creatinine, Ser: 0.85 mg/dL (ref 0.44–1.00)
GFR, Estimated: >60 mL/min (ref >60)
Glucose, Bld: 102 mg/dL — ABNORMAL HIGH (ref 70–99)
Potassium: 4.9 mmol/L (ref 3.5–5.1)
Sodium: 137 mmol/L (ref 135–145)

## 2023-02-10 LAB — CBC
HCT: 32.4 % — ABNORMAL LOW (ref 36.0–46.0)
Hemoglobin: 10.1 g/dL — ABNORMAL LOW (ref 12.0–15.0)
MCH: 26 pg (ref 26.0–34.0)
MCHC: 31.2 g/dL (ref 30.0–36.0)
MCV: 83.5 fL (ref 80.0–100.0)
Platelets: 132 10*3/uL — ABNORMAL LOW (ref 150–400)
RBC: 3.88 MIL/uL (ref 3.87–5.11)
RDW: 17 % — ABNORMAL HIGH (ref 11.5–15.5)
WBC: 6.5 10*3/uL (ref 4.0–10.5)
nRBC: 0 % (ref 0.0–0.2)

## 2023-02-10 LAB — HEPARIN LEVEL (UNFRACTIONATED): Heparin Unfractionated: 0.33 IU/mL (ref 0.30–0.70)

## 2023-02-10 MED ORDER — DIGOXIN 250 MCG PO TABS
0.2500 mg | ORAL_TABLET | ORAL | Status: AC
  Administered 2023-02-10 (×2): 0.25 mg via ORAL
  Filled 2023-02-10 (×2): qty 1

## 2023-02-10 NOTE — Progress Notes (Signed)
ANTICOAGULATION CONSULT NOTE  Pharmacy Consult for Heparin  Indication: atrial fibrillation  Allergies  Allergen Reactions   Bupropion Hcl Other (See Comments)    SEIZURES!!   Other    Penicillins    Tilactase Diarrhea   Azithromycin Rash   Erythromycin Rash    Patient Measurements: Height: 5\' 5"  (165.1 cm) Weight: 102 kg (224 lb 13.9 oz) IBW/kg (Calculated) : 57 Heparin Dosing Weight: 80.5 kg   Vital Signs: Temp: 97.4 F (36.3 C) (06/08 0453) Temp Source: Oral (06/08 0049) BP: 95/64 (06/08 0453) Pulse Rate: 72 (06/08 0453)  Labs: Recent Labs    02/07/23 1933 02/07/23 2227 02/08/23 0047 02/08/23 0513 02/08/23 1016 02/08/23 1841 02/09/23 0434 02/10/23 0605  HGB 11.4*  --   --  10.9*  --   --  10.0*  --   HCT 36.5  --   --  35.1*  --   --  31.9*  --   PLT 157  --   --  144*  --   --  130*  --   APTT 29  --   --   --   --   --   --   --   LABPROT 15.5*  --   --   --   --   --   --   --   INR 1.2  --   --   --   --   --   --   --   HEPARINUNFRC  --   --   --   --    < > 0.51 0.42 0.33  CREATININE 1.07*  --   --  0.79  --   --  0.94  --   TROPONINIHS  --  9 10  --   --   --   --   --    < > = values in this interval not displayed.     Estimated Creatinine Clearance: 57.5 mL/min (by C-G formula based on SCr of 0.94 mg/dL).   Medical History: Past Medical History:  Diagnosis Date   Anemia    took Fe- orally 2 yrs. ago    Anxiety    panic attacks    Arthritis    shoulders, knees   Cancer (HCC)    skin- preCA   Depression    GERD (gastroesophageal reflux disease)    uses Protonix as needed    Headache(784.0)    Humerus fracture    Right    Hyposmolality and/or hyponatremia 02/27/2013   Hypothyroidism    IBS (irritable bowel syndrome)    Injury of left foot 09/05/2018   Injury of right hand 04/27/2014   Memory loss    Mental disorder    Migraine    Preoperative examination 02/26/2013   Seizures (HCC)    caused by depression , seritonin seizure  - effected short term memory     Shortness of breath     Assessment: Pharmacy consulted to dose heparin in this 77 year old female admitted with AFib.  Per MD, pt is fall risk so will use heparin for anticoag.   No prior anticoag noted.  CrCl = 50.5 ml/min   6/6 1016 HL 0.49, therapeutic x 1 6/6 1841 HL 0.51, therapeutic X 2 6/7 0434 HL 0.42, therapeutic X 3 6/8 0605 HL 0.33, therapeutic X 4    Goal of Therapy:  Heparin level 0.3-0.7 units/ml Monitor platelets by anticoagulation protocol: Yes   Plan:  Heparin level therapeutic x  4 Continue heparin infusion at rate of 1250 units/hr Recheck heparin level in am Daily CBC while on heparin  Weaver Tweed D, PharmD 02/10/2023,6:57 AM

## 2023-02-10 NOTE — Evaluation (Signed)
Occupational Therapy Evaluation Patient Details Name: [name] MRN: [id number] DOB: Dec 28, 1945 Today's Date: 02/10/2023   History of Present Illness Pt is a 77 y.o. female with history of overt obesity, seizures, hypothyroidism, mechanical falls, iron deficiency anemia, anxiety/panic attacks. Pt admitted to ED 6/5 after a fall at [facility], no significant trauma to body reported.   Clinical Impression   Patient presenting with decreased Ind in self care,balance, functional mobility/transfers, endurance, and safety awareness. Patient reports living at [facility]. She endorses use of RW for mobility in room and wheelchair she propels with B feet for safety outside of room. Pt reports being mod I for self care tasks but utilizing staff assistance if needed. She propels wheelchair to dining room but lately has been taking meals in her room for convenience.  Patient seated in recliner chair eating sandwich with multiple coughs while swallowing noted. She engages in strength assessment but then declines attempts to stand or transfer. Pt has had 5-6 falls this year. Patient will benefit from acute OT to increase overall independence in the areas of ADLs, functional mobility, and safety awareness in order to safely discharge.     Recommendations for follow up therapy are one component of a multi-disciplinary discharge planning process, led by the attending physician.  Recommendations may be updated based on patient status, additional functional criteria and insurance authorization.   Assistance Recommended at Discharge Intermittent Supervision/Assistance  Patient can return home with the following A lot of help with walking and/or transfers;A lot of help with bathing/dressing/bathroom    Functional Status Assessment  Patient has had a recent decline in their functional status and demonstrates the ability to make significant improvements in function in a reasonable and predictable amount  of time.  Equipment Recommendations  Other (comment) (defer to next venue of care)       Precautions / Restrictions Precautions Precautions: Fall Restrictions Weight Bearing Restrictions: No      Mobility Bed Mobility                    Transfers                   General transfer comment: pt refused      Balance Overall balance assessment: Needs assistance     Sitting balance - Comments: Able to sit EOB x 5 mins with no LOB                                   ADL either performed or assessed with clinical judgement   ADL Overall ADL's : Needs assistance/impaired Eating/Feeding: Set up;Sitting                                     General ADL Comments: pt refused standing or transfer attempts this session     Vision Patient Visual Report: No change from baseline              Pertinent Vitals/Pain Pain Assessment Pain Assessment: No/denies pain     Hand Dominance Right   Extremity/Trunk Assessment Upper Extremity Assessment Upper Extremity Assessment: Generalized weakness   Lower Extremity Assessment Lower Extremity Assessment: Generalized weakness       Communication Communication Communication: No difficulties   Cognition Arousal/Alertness: Awake/alert Behavior During Therapy: WFL for tasks assessed/performed Overall Cognitive Status: Within  Functional Limits for tasks assessed                                                  Home Living Family/patient expects to be discharged to:: Assisted living ([facility]) Living Arrangements: Alone Available Help at Discharge: Other (Comment) (ALF) Type of Home: Assisted living             Bathroom Shower/Tub: Walk-in shower   Bathroom Toilet: Handicapped height Bathroom Accessibility: Yes   Home Equipment: Agricultural consultant (2 wheels);Wheelchair - manual;Grab bars - tub/shower;Shower seat - built in          Prior  Functioning/Environment Prior Level of Function : Needs assist;History of Falls (last six months)             Mobility Comments: Uses RW to get around room, W/C otherwise, household ambulator ADLs Comments: Independent with ADL's but reports staff helps if needed. She takes meals in her room or in dinning room.        OT Problem List: Decreased strength;Decreased activity tolerance;Decreased safety awareness;Impaired balance (sitting and/or standing);Decreased knowledge of use of DME or AE;Cardiopulmonary status limiting activity      OT Treatment/Interventions: Self-care/ADL training;Therapeutic exercise;Therapeutic activities;DME and/or AE instruction;Patient/family education;Balance training;Energy conservation    OT Goals(Current goals can be found in the care plan section) Acute Rehab OT Goals Patient Stated Goal: to get stronger OT Goal Formulation: With patient Time For Goal Achievement:  Potential to Achieve Goals: Fair ADL Goals Pt Will Perform Grooming: with supervision;standing Pt Will Perform Lower Body Dressing: with supervision;sit to/from stand Pt Will Transfer to Toilet: with supervision;ambulating Pt Will Perform Toileting - Clothing Manipulation and hygiene: with supervision;sit to/from stand  OT Frequency: Min 2X/week       AM-PAC OT "6 Clicks" Daily Activity     Outcome Measure Help from another person eating meals?: None Help from another person taking care of personal grooming?: A Little Help from another person toileting, which includes using toliet, bedpan, or urinal?: A Lot Help from another person bathing (including washing, rinsing, drying)?: A Lot Help from another person to put on and taking off regular upper body clothing?: A Little Help from another person to put on and taking off regular lower body clothing?: A Lot 6 Click Score: 16   End of Session    Activity Tolerance: Other (comment) (self limiting) Patient left: in chair;with  call bell/phone within reach;with chair alarm set  OT Visit Diagnosis: Unsteadiness on feet (R26.81);Muscle weakness (generalized) (M62.81);Repeated falls (R29.6);History of falling (Z91.81)                Time: 1610-9604 OT Time Calculation (min): 12 min Charges:  OT General Charges $OT Visit: 1 Visit OT Evaluation $OT Eval Moderate Complexity: 1 [address], [place], OTR/L , CBIS ascom [phone]  02/10/23, 1:48 PM

## 2023-02-10 NOTE — Evaluation (Signed)
Physical Therapy Evaluation Patient Details Name: [name] MRN: [id number] DOB: Mar 30, 1946 Today's Date: 02/10/2023  History of Present Illness  Pt is a 77 y.o. female with history of overt obesity, seizures, hypothyroidism, mechanical falls, iron deficiency anemia, anxiety/panic attacks. Pt admitted to ED 6/5 after a fall at [facility], no significant trauma to body reported. MD assessment includes: falls, dizziness, near syncope, A-fib with RVR, mild/mod MR, UTI, and BLE edema.   Clinical Impression  Pt pleasant and motivated to participate in therapy. She required min A for bed mobility and transfer tasks, mainly for stability and elevation assistance to get to sitting/standing EOB; however heavy UE support on bed rails and RW was needed. Vitals remained WNL throughout the session. In supine: BP 108/81, HR 78, O2 93, sitting: BP 117/93, HR 53, O2 89 (with poor signal from pulse ox.), standing BP with half performed in sitting secondary to pt being unable to tolerate full standing: BP 100/70, HR 59, O2 93. Pt was unsymptomatic for dizziness while standing/walking. She required min A for safety and stability when ambulating. She was able to perform 2 bouts of side-steps and fwd/back steps with a short rest break in between. No adverse responses reported other than her legs feeling weak. Pt will benefit from continued PT services upon discharge to safely address deficits listed in patient problem list for decreased caregiver assistance and eventual return to PLOF.      Recommendations for follow up therapy are one component of a multi-disciplinary discharge planning process, led by the attending physician.  Recommendations may be updated based on patient status, additional functional criteria and insurance authorization.  Follow Up Recommendations Can patient physically be transported by private vehicle: No     Assistance Recommended at Discharge Intermittent Supervision/Assistance   Patient can return home with the following  A lot of help with walking and/or transfers;A little help with bathing/dressing/bathroom;Help with stairs or ramp for entrance;Assist for transportation    Equipment Recommendations Other (comment) (TBD at next venue of care)  Recommendations for Other Services       Functional Status Assessment Patient has had a recent decline in their functional status and demonstrates the ability to make significant improvements in function in a reasonable and predictable amount of time.     Precautions / Restrictions Precautions Precautions: Fall Restrictions Weight Bearing Restrictions: No      Mobility  Bed Mobility Overal bed mobility: Needs Assistance Bed Mobility: Supine to Sit     Supine to sit: Min assist     General bed mobility comments: Min A for elevation assist and UE support    Transfers Overall transfer level: Needs assistance Equipment used: Rolling walker (2 wheels) Transfers: Sit to/from Stand Sit to Stand: Min assist           General transfer comment: Heavy use of UE's on RW, min A for forward momentum and elevation    Ambulation/Gait Ambulation/Gait assistance: Min assist Gait Distance (Feet): 1 x 5 feet, 1 x 10 feet Assistive device: Rolling walker (2 wheels) Gait Pattern/deviations: Decreased step length - right, Decreased step length - left, Step-to pattern Gait velocity: decreased     General Gait Details: Min A for stability. Pt able to take several short bouts of sidesteps and fwd/back steps near EOB. Heavy use of UE's on RW.  Stairs            Wheelchair Mobility    Modified Rankin (Stroke Patients Only)  Balance Overall balance assessment: Needs assistance Sitting-balance support: Feet supported, No upper extremity supported Sitting balance-Leahy Scale: Good Sitting balance - Comments: Able to sit EOB x 5 mins with no LOB   Standing balance support: During functional activity,  Bilateral upper extremity supported, Reliant on assistive device for balance Standing balance-Leahy Scale: Poor Standing balance comment: Could maintain standing balance but needed BUE to feel stable.                             Pertinent Vitals/Pain Pain Assessment Pain Assessment: 0-10 Pain Score: 2  Pain Location: Back, L thigh/hip Pain Descriptors / Indicators: Shooting Pain Intervention(s): Monitored during session, Repositioned, Premedicated before session    Home Living Family/patient expects to be discharged to:: Assisted living ([facility]) Living Arrangements: Alone Available Help at Discharge: Other (Comment) (ALF) Type of Home: Assisted living           Home Equipment: Agricultural consultant (2 wheels);Wheelchair - manual;Grab bars - tub/shower;Shower seat - built in      Prior Function Prior Level of Function : Needs assist;History of Falls (last six months)             Mobility Comments: Uses RW to get around room, W/C otherwise, household ambulator ADLs Comments: Independent with ADL's but reports staff helps if needed. She takes meals in her room or in dinning room.     Hand Dominance   Dominant Hand: Right    Extremity/Trunk Assessment   Upper Extremity Assessment Upper Extremity Assessment: Generalized weakness    Lower Extremity Assessment Lower Extremity Assessment: Generalized weakness       Communication   Communication: No difficulties  Cognition Arousal/Alertness: Awake/alert Behavior During Therapy: WFL for tasks assessed/performed Overall Cognitive Status: Within Functional Limits for tasks assessed                                          General Comments      Exercises     Assessment/Plan    PT Assessment Patient needs continued PT services  PT Problem List Decreased strength;Decreased balance;Decreased mobility;Decreased activity tolerance       PT Treatment Interventions DME  instruction;Therapeutic exercise;Balance training;Gait training;Stair training;Therapeutic activities;Patient/family education    PT Goals (Current goals can be found in the Care Plan section)  Acute Rehab PT Goals Patient Stated Goal: Walk better PT Goal Formulation: With patient Time For Goal Achievement: 02/23/23 Potential to Achieve Goals: Fair    Frequency Min 4X/week     Co-evaluation               AM-PAC PT "6 Clicks" Mobility  Outcome Measure Help needed turning from your back to your side while in a flat bed without using bedrails?: A Little Help needed moving from lying on your back to sitting on the side of a flat bed without using bedrails?: A Little Help needed moving to and from a bed to a chair (including a wheelchair)?: A Lot Help needed standing up from a chair using your arms (e.g., wheelchair or bedside chair)?: A Lot Help needed to walk in hospital room?: A Lot Help needed climbing 3-5 steps with a railing? : Total 6 Click Score: 13    End of Session Equipment Utilized During Treatment: Gait belt;Oxygen Activity Tolerance: Patient limited by fatigue Patient left: in chair;with call bell/phone  within reach;with chair alarm set Nurse Communication: Mobility status;Need for lift equipment;Precautions PT Visit Diagnosis: Unsteadiness on feet (R26.81);Muscle weakness (generalized) (M62.81);History of falling (Z91.81)    Time: 1016-1106 PT Time Calculation (min) (ACUTE ONLY): 50 min   Charges:   PT Evaluation $PT Eval Moderate Complexity: 1 Mod PT Treatments $Gait Training: 8-22 mins        Ercil Cassis, SPT 02/10/23, 2:11 PM

## 2023-02-10 NOTE — Progress Notes (Addendum)
PROGRESS NOTE    [name]  ZOX:[id number] DOB: 1946/07/07 DOA: 02/07/2023 PCP: Smiley Houseman, NP   Assessment & Plan:   Principal Problem:   Falls Active Problems:   Paroxysmal atrial fibrillation (HCC)   Hypotension   AKI (acute kidney injury) (HCC)   Acute cystitis without hematuria   History of gastrointestinal hemorrhage   GERD with stricture   Hypokalemia   Hypothyroidism   Dementia with behavioral disturbance (HCC)   DEPENDENCE, OPIOID, CONTINUOUS   Obesity (BMI 30.0-34.9)   Cerebrovascular accident (CVA) due to thrombosis of precerebral artery (HCC)   Gait instability   Fall   Atrial fibrillation with RVR (HCC)  Assessment and Plan: Recurrent falls: PT/OT recs SNF.  CT head shows no acute intracranial abnormalities  A.fib: w/ RVR. Continue on IV heparin but po diltiazem, metoprolol held secondary to hypotension as per cardio. High fall risk    UTI: urine cx growing aerococcus. Continue on keflex    Thrombocytopenia: etiology unclear. Labile. Will continue to monitor    Chronic diarrhea: etiology unclear. Possibly medication induced vs food induced vs microscopic colitis. Continue w/ supportive care   B/l LE edema:etiology unclear, ? venous insufficiency. Echo shows EF 60-65%, diastolic function is indeterminate.  [place] of b/l LE neg for DVTs.         DVT prophylaxis: IV heparin  Code Status: full  Family Communication: Disposition Plan: possibly d/c to SNF  Level of care: Telemetry Cardiac  Status is: Inpatient Remains inpatient appropriate because: severity of illness    Consultants:  Cardio   Procedures:   Antimicrobials: keflex   Subjective: Pt c/o recurrent falls at her ALF.   Objective: Vitals:   02/10/23 0015 02/10/23 0049 02/10/23 0453 02/10/23 0752  BP: 101/69 (!) 85/63 95/64 (!) 81/62  Pulse: (!) 151 90 72 80  Resp: 20 19 20 18   Temp: (!) 101 F (38.3 C) 98.2 F (36.8 C) (!) 97.4 F (36.3 C) 98.3 F (36.8 C)  TempSrc:   Oral    SpO2: 91% (!) 89% 94% 90%  Weight:      Height:        Intake/Output Summary (Last 24 hours) at 02/10/2023 0925 Last data filed at 02/10/2023 0700 Gross per 24 hour  Intake 1256.37 ml  Output 700 ml  Net 556.37 ml   Filed Weights   02/07/23 1912  Weight: 102 kg    Examination:  General exam: Appears calm and comfortable  Respiratory system: Clear to auscultation. Respiratory effort normal. Cardiovascular system: irregularly irregular. No rubs, gallops or clicks. Gastrointestinal system: Abdomen is obese, soft and nontender. Normal bowel sounds heard. Central nervous system: Alert and awake. Moves all extremities Psychiatry: Judgement and insight appears at baseline. Mood & affect appropriate.     Data Reviewed: I have personally reviewed following labs and imaging studies  CBC: Recent Labs  Lab 02/07/23 1933 02/08/23 0513 02/09/23 0434  WBC 5.2 7.2 6.6  NEUTROABS 3.1  --  3.9  HGB 11.4* 10.9* 10.0*  HCT 36.5 35.1* 31.9*  MCV 84.1 83.8 83.7  PLT 157 144* 130*   Basic Metabolic Panel: Recent Labs  Lab 02/07/23 1933 02/07/23 2224 02/08/23 0513 02/09/23 0433 02/09/23 0434 02/10/23 0746  NA 142  --  140  --  140 137  K 3.4*  --  3.3*  --  3.5 4.9  CL 105  --  106  --  105 103  CO2 29  --  24  --  27  29  GLUCOSE 52*  --  95  --  104* 102*  BUN 19  --  16  --  11 10  CREATININE 1.07*  --  0.79  --  0.94 0.85  CALCIUM 7.8*  --  7.9*  --  8.0* 8.5*  MG  --  1.8  --   --  1.9  --   PHOS  --   --   --  3.7  --   --    GFR: Estimated Creatinine Clearance: 63.5 mL/min (by C-G formula based on SCr of 0.85 mg/dL). Liver Function Tests: Recent Labs  Lab 02/07/23 1933 02/08/23 0513 02/09/23 0434  AST 17 18 15   ALT 11 9 10   ALKPHOS 87 77 69  BILITOT 0.5 0.5 0.6  PROT 6.3* 5.8* 5.5*  ALBUMIN 3.3* 2.9* 2.8*   No results for input(s): "LIPASE", "AMYLASE" in the last 168 hours. No results for input(s): "AMMONIA" in the last 168 hours. Coagulation  Profile: Recent Labs  Lab 02/07/23 1933  INR 1.2   Cardiac Enzymes: No results for input(s): "CKTOTAL", "CKMB", "CKMBINDEX", "TROPONINI" in the last 168 hours. BNP (last 3 results) No results for input(s): "PROBNP" in the last 8760 hours. HbA1C: No results for input(s): "HGBA1C" in the last 72 hours. CBG: Recent Labs  Lab 02/09/23 0315  GLUCAP 94   Lipid Profile: No results for input(s): "CHOL", "HDL", "LDLCALC", "TRIG", "CHOLHDL", "LDLDIRECT" in the last 72 hours. Thyroid Function Tests: Recent Labs    02/09/23 0433  TSH 3.566   Anemia Panel: No results for input(s): "VITAMINB12", "FOLATE", "FERRITIN", "TIBC", "IRON", "RETICCTPCT" in the last 72 hours. Sepsis Labs: Recent Labs  Lab 02/07/23 1933  LATICACIDVEN 1.9    Recent Results (from the past 240 hour(s))  Blood Culture (routine x 2)     Status: None (Preliminary result)   Collection Time: 02/07/23  7:27 PM   Specimen: BLOOD  Result Value Ref Range Status   Specimen Description BLOOD BLOOD LEFT FOREARM  Final   Special Requests   Final    BOTTLES DRAWN AEROBIC AND ANAEROBIC Blood Culture adequate volume   Culture   Final    NO GROWTH 3 DAYS Performed at [facility], [address], [place], [place] [address]    Report Status PENDING  Incomplete  Blood Culture (routine x 2)     Status: None (Preliminary result)   Collection Time: 02/07/23  7:32 PM   Specimen: BLOOD  Result Value Ref Range Status   Specimen Description BLOOD BLOOD RIGHT ARM  Final   Special Requests   Final    BOTTLES DRAWN AEROBIC AND ANAEROBIC Blood Culture results may not be optimal due to an excessive volume of blood received in culture bottles   Culture   Final    NO GROWTH 3 DAYS Performed at [facility], [address], [place], [place] [address]    Report Status PENDING  Incomplete  Urine Culture     Status: Abnormal   Collection Time: 02/07/23  8:47 PM   Specimen: Urine, Random  Result Value Ref Range  Status   Specimen Description   Final    URINE, RANDOM Performed at [facility], [address], [place], [place] [address]    Special Requests   Final    NONE Reflexed from [id number] Performed at [facility], [address] [place], [place] [address]    Culture (A)  Final    >=100,000 COLONIES/mL AEROCOCCUS SPECIES Standardized susceptibility  testing for this organism is not available. Performed at [facility], [address], [place], [place] [address]    Report Status 02/09/2023 FINAL  Final         Radiology Studies: ECHOCARDIOGRAM COMPLETE BUBBLE STUDY  Result Date: 02/08/2023    ECHOCARDIOGRAM REPORT   Patient Name:   [name] Date of Exam: 02/08/2023 Medical Rec #:  [id number]     Height:       65.0 in Accession #:    [id number]    Weight:       224.9 lb Date of Birth:  Jul 24, 1946     BSA:          2.079 m Patient Age:    77 years      BP:           111/64 mmHg Patient Gender: F             HR:           98 bpm. Exam Location:  [facility] Procedure: 2D Echo, Cardiac Doppler, Color Doppler and Saline Contrast Bubble            Study Indications:     Atrial Fibrillation  History:         Patient has prior history of Echocardiogram examinations, most                  recent 06/04/2020. CHF, Stroke; Arrythmias:Atrial Fibrillation.  Sonographer:     Mikki Harbor Referring Phys:  [id number] Eliezer Mccoy PATEL Diagnosing Phys: Julien Nordmann MD  Sonographer Comments: Patient is obese. IMPRESSIONS  1. Left ventricular ejection fraction, by estimation, is 60 to 65%. The left ventricle has normal function. The left ventricle has no regional wall motion abnormalities. There is mild left ventricular hypertrophy. Left ventricular diastolic parameters are indeterminate.  2. Right ventricular systolic function is normal. The right ventricular size is normal. There is mildly elevated pulmonary artery systolic pressure.  3. Left atrial size was moderately dilated.  4. The mitral valve is  normal in structure. Mild to moderate mitral valve regurgitation. No evidence of mitral stenosis.  5. The aortic valve has an indeterminant number of cusps. Aortic valve regurgitation is mild to moderate. No aortic stenosis is present.  6. The inferior vena cava is normal in size with greater than 50% respiratory variability, suggesting right atrial pressure of 3 mmHg.  7. Agitated saline contrast bubble study was negative, with no evidence of any interatrial shunt. FINDINGS  Left Ventricle: Left ventricular ejection fraction, by estimation, is 60 to 65%. The left ventricle has normal function. The left ventricle has no regional wall motion abnormalities. The left ventricular internal cavity size was normal in size. There is  mild left ventricular hypertrophy. Left ventricular diastolic parameters are indeterminate. Right Ventricle: The right ventricular size is normal. No increase in right ventricular wall thickness. Right ventricular systolic function is normal. There is mildly elevated pulmonary artery systolic pressure. The tricuspid regurgitant velocity is 2.71  m/s, and with an assumed right atrial pressure of 8 mmHg, the estimated right ventricular systolic pressure is 37.4 mmHg. Left Atrium: Left atrial size was moderately dilated. Right Atrium: Right atrial size was normal in size. Pericardium: There is no evidence of pericardial effusion. Mitral Valve: The mitral valve is normal in structure. Mild to moderate mitral valve regurgitation. No evidence of mitral valve stenosis. MV peak gradient, 6.6 mmHg. The mean mitral valve gradient is 2.0 mmHg. Tricuspid Valve: The tricuspid  valve is normal in structure. Tricuspid valve regurgitation is mild . No evidence of tricuspid stenosis. Aortic Valve: The aortic valve has an indeterminant number of cusps. Aortic valve regurgitation is mild to moderate. No aortic stenosis is present. Aortic valve mean gradient measures 3.3 mmHg. Aortic valve peak gradient measures  6.2 mmHg. Aortic valve area, by VTI measures 2.32 cm. Pulmonic Valve: The pulmonic valve was normal in structure. Pulmonic valve regurgitation is not visualized. No evidence of pulmonic stenosis. Aorta: The aortic root is normal in size and structure. Venous: The inferior vena cava is normal in size with greater than 50% respiratory variability, suggesting right atrial pressure of 3 mmHg. IAS/Shunts: No atrial level shunt detected by color flow Doppler. Agitated saline contrast was given intravenously to evaluate for intracardiac shunting. Agitated saline contrast bubble study was negative, with no evidence of any interatrial shunt. There  is no evidence of a patent foramen ovale. There is no evidence of an atrial septal defect.  LEFT VENTRICLE PLAX 2D LVIDd:         4.70 cm LVIDs:         3.30 cm LV PW:         1.30 cm LV IVS:        1.30 cm LVOT diam:     1.90 cm LV SV:         54 LV SV Index:   26 LVOT Area:     2.84 cm  RIGHT VENTRICLE RV Basal diam:  3.50 cm RV Mid diam:    3.20 cm RV S prime:     10.80 cm/s LEFT ATRIUM             Index        RIGHT ATRIUM           Index LA diam:        4.40 cm 2.12 cm/m   RA Area:     21.30 cm LA Vol (A2C):   85.3 ml 41.03 ml/m  RA Volume:   56.60 ml  27.22 ml/m LA Vol (A4C):   90.9 ml 43.72 ml/m LA Biplane Vol: 91.8 ml 44.15 ml/m  AORTIC VALVE                    PULMONIC VALVE AV Area (Vmax):    2.44 cm     PV Vmax:       0.96 m/s AV Area (Vmean):   2.14 cm     PV Peak grad:  3.7 mmHg AV Area (VTI):     2.32 cm AV Vmax:           124.33 cm/s AV Vmean:          79.533 cm/s AV VTI:            0.231 m AV Peak Grad:      6.2 mmHg AV Mean Grad:      3.3 mmHg LVOT Vmax:         107.00 cm/s LVOT Vmean:        60.100 cm/s LVOT VTI:          0.189 m LVOT/AV VTI ratio: 0.82  AORTA Ao Root diam: 3.70 cm Ao Asc diam:  3.60 cm MITRAL VALVE                TRICUSPID VALVE MV Area (PHT): 4.54 cm     TR Peak grad:   29.4 mmHg MV Area VTI:   2.89 cm  TR Vmax:        271.00  cm/s MV Peak grad:  6.6 mmHg MV Mean grad:  2.0 mmHg     SHUNTS MV Vmax:       1.28 m/s     Systemic VTI:  0.19 m MV Vmean:      69.3 cm/s    Systemic Diam: 1.90 cm MV Decel Time: 167 msec MV E velocity: 106.00 cm/s Julien Nordmann MD Electronically signed by Julien Nordmann MD Signature Date/Time: 02/08/2023/3:26:18 PM    Final         Scheduled Meds:  cephALEXin  500 mg Oral Q12H   cholecalciferol  2,000 Units Oral Daily   divalproex  125 mg Oral BID   hyoscyamine  0.125 mg Oral BID   levothyroxine  100 mcg Oral Daily   lidocaine  1 patch Transdermal Daily   oxyCODONE  10 mg Oral Q8H   pantoprazole  40 mg Oral Daily   pregabalin  100 mg Oral BID   QUEtiapine  50 mg Oral QHS   ramelteon  8 mg Oral QHS   sodium chloride flush  3 mL Intravenous Q12H   sodium chloride flush  3 mL Intravenous Q12H   vortioxetine HBr  20 mg Oral Daily   Continuous Infusions:  sodium chloride     heparin 1,250 Units/hr (02/10/23 0700)     LOS: 2 days    Time spent: 35 mins     Charise Killian, MD [facility] Pager [phone]  If 7PM-7AM, please contact night-coverage [email or web address] 02/10/2023, 9:25 AM

## 2023-02-10 NOTE — Progress Notes (Signed)
Rounding Note    Patient Name: [name] Date of Encounter: 02/10/2023  [facility] Cardiologist: [facility]  Subjective   Blood pressure remains low, asymptomatic She reports blood pressure always low Diltiazem metoprolol held more than 24 hours ago Heart rate 90-100, higher on exertion, moving in the bed Remains in atrial fibrillation On antibiotics for UTI  Inpatient Medications    Scheduled Meds:  cephALEXin  500 mg Oral Q12H   cholecalciferol  2,000 Units Oral Daily   digoxin  0.25 mg Oral Q4H   divalproex  125 mg Oral BID   hyoscyamine  0.125 mg Oral BID   levothyroxine  100 mcg Oral Daily   lidocaine  1 patch Transdermal Daily   oxyCODONE  10 mg Oral Q8H   pantoprazole  40 mg Oral Daily   pregabalin  100 mg Oral BID   QUEtiapine  50 mg Oral QHS   ramelteon  8 mg Oral QHS   sodium chloride flush  3 mL Intravenous Q12H   sodium chloride flush  3 mL Intravenous Q12H   vortioxetine HBr  20 mg Oral Daily   Continuous Infusions:  sodium chloride     heparin 1,250 Units/hr (02/10/23 0700)   PRN Meds: sodium chloride, acetaminophen **OR** acetaminophen, HYDROcodone-acetaminophen, morphine injection, sodium chloride flush, SUMAtriptan, traZODone   Vital Signs    Vitals:   02/10/23 0015 02/10/23 0049 02/10/23 0453 02/10/23 0752  BP: 101/69 (!) 85/63 95/64 (!) 81/62  Pulse: (!) 151 90 72 80  Resp: 20 19 20 18   Temp: (!) 101 F (38.3 C) 98.2 F (36.8 C) (!) 97.4 F (36.3 C) 98.3 F (36.8 C)  TempSrc:  Oral    SpO2: 91% (!) 89% 94% 90%  Weight:      Height:        Intake/Output Summary (Last 24 hours) at 02/10/2023 1206 Last data filed at 02/10/2023 1042 Gross per 24 hour  Intake 1496.37 ml  Output 700 ml  Net 796.37 ml      02/07/2023    7:12 PM 02/01/2023    2:57 PM 01/22/2023    6:22 PM  Last 3 Weights  Weight (lbs) 224 lb 13.9 oz 224 lb 13.9 oz 225 lb  Weight (kg) 102 kg 102 kg 102.059 kg      Telemetry    Atrial fibrillation  rate in the 90-100, 110 with exertion personally Reviewed  ECG     - Personally Reviewed  Physical Exam   Constitutional:  oriented to person, place, and time. No distress.  HENT:  Head: Grossly normal Eyes:  no discharge. No scleral icterus.  Neck: No JVD, no carotid bruits  Cardiovascular: Irregularly irregular, no murmurs appreciated Pulmonary/Chest: Clear to auscultation bilaterally, no wheezes or rails Abdominal: Soft.  no distension.  no tenderness.  Musculoskeletal: Normal range of motion Neurological:  normal muscle tone. Coordination normal. No atrophy Skin: Skin warm and dry Psychiatric: normal affect, pleasant   Labs    High Sensitivity Troponin:   Recent Labs  Lab 02/07/23 2227 02/08/23 0047  TROPONINIHS 9 10     Chemistry Recent Labs  Lab 02/07/23 1933 02/07/23 2224 02/08/23 0513 02/09/23 0434 02/10/23 0746  NA 142  --  140 140 137  K 3.4*  --  3.3* 3.5 4.9  CL 105  --  106 105 103  CO2 29  --  24 27 29   GLUCOSE 52*  --  95 104* 102*  BUN 19  --  16  11 10  CREATININE 1.07*  --  0.79 0.94 0.85  CALCIUM 7.8*  --  7.9* 8.0* 8.5*  MG  --  1.8  --  1.9  --   PROT 6.3*  --  5.8* 5.5*  --   ALBUMIN 3.3*  --  2.9* 2.8*  --   AST 17  --  18 15  --   ALT 11  --  9 10  --   ALKPHOS 87  --  77 69  --   BILITOT 0.5  --  0.5 0.6  --   GFRNONAA 53*  --  >60 >60 >60  ANIONGAP 8  --  10 8 5     Lipids No results for input(s): "CHOL", "TRIG", "HDL", "LABVLDL", "LDLCALC", "CHOLHDL" in the last 168 hours.  Hematology Recent Labs  Lab 02/08/23 0513 02/09/23 0434 02/10/23 0746  WBC 7.2 6.6 6.5  RBC 4.19 3.81* 3.88  HGB 10.9* 10.0* 10.1*  HCT 35.1* 31.9* 32.4*  MCV 83.8 83.7 83.5  MCH 26.0 26.2 26.0  MCHC 31.1 31.3 31.2  RDW 16.7* 16.7* 17.0*  PLT 144* 130* 132*   Thyroid  Recent Labs  Lab 02/09/23 0433  TSH 3.566    BNP Recent Labs  Lab 02/07/23 1933  BNP 804.3*    DDimer No results for input(s): "DDIMER" in the last 168 hours.   Radiology     No results found.  Cardiac Studies  Echo   1. Left ventricular ejection fraction, by estimation, is 60 to 65%. The  left ventricle has normal function. The left ventricle has no regional  wall motion abnormalities. There is mild left ventricular hypertrophy.  Left ventricular diastolic parameters  are indeterminate.   2. Right ventricular systolic function is normal. The right ventricular  size is normal. There is mildly elevated pulmonary artery systolic  pressure.   3. Left atrial size was moderately dilated.   4. The mitral valve is normal in structure. Mild to moderate mitral valve  regurgitation. No evidence of mitral stenosis.   5. The aortic valve has an indeterminant number of cusps. Aortic valve  regurgitation is mild to moderate. No aortic stenosis is present.   6. The inferior vena cava is normal in size with greater than 50%  respiratory variability, suggesting right atrial pressure of 3 mmHg.   7. Agitated saline contrast bubble study was negative, with no evidence  of any interatrial shunt.    Patient Profile     [name] is a 77 year old woman with history of overt obesity, seizures, hypothyroidism, mechanical falls, iron deficiency anemia, anxiety/panic attacks, presenting from [facility] assisted facility after a fall Long discussion with her concerning home situation, she reports independent living, some assistance from staff and son.  Periodic falls, legs have been getting weaker.  This admission was transferring from couch to bed and she was not using her walker and went down, trauma to left lip.  Denies significant trauma to body Staff at the nursing facility found her on the floor In the ER noted to be in atrial fibrillation, initial blood pressure 80/40, hypoxia with saturations 88% on room air Given IV fluids, placed on nasal cannula oxygen Chest x-ray with cardiomegaly, pulmonary vascular congestion CT scan head showing old bilateral  frontoparietal infarcts CT chest abdomen pelvis T1 compression fracture, cardiomegaly No DVT  Assessment & Plan    Atrial fibrillation with RVR Prior EKGs dating back to September 2023, normal sinus rhythm  Timing of onset of atrial  fibrillation unclear, possibly from mechanical fall this admission -Initially placed on diltiazem infusion and transition to diltiazem 60 every 6 hours with metoprolol to tartrate Oral medications held for hypotension overnight June 7 Rate on no medications 90-100, sometimes 110 or higher with exertion -Blood pressure continues to run low On heparin infusion with plan to transition to Eliquis 5 twice daily at discharge CHA2DS2-VASc score of at least 3  Needs to work with PT -Will start digoxin 0.25 two doses today, 0.125 starting tomorrow for rate control   Iron deficiency anemia Recent trend downward in her hemoglobin, 10. down from 12.3 two weeks ago Would need to monitor closely on heparin/Eliquis  as outpatient Hemoglobin stable today, 10  Hypotension Blood pressure by her report is always low Continues to run low despite holding diltiazem and metoprolol Possibly exacerbated by urinary tract infection   Mechanical falls/leg weakness Reports worsening leg weakness during her time spent at [facility]   was transitioning from couch to bed without her walker Needs to work closely with PT Also need to determine if she needs a higher level of care, assisted living   Total encounter time more than 50 minutes  Greater than 50% was spent in counseling and coordination of care with the patient   For questions or updates, please contact [facility] Please consult [email or web address] for contact info under        Signed, Julien Nordmann, MD  02/10/2023, 12:06 PM

## 2023-02-10 NOTE — Progress Notes (Signed)
Rounding Note    Patient Name: [name] Date of Encounter: 02/10/2023  [facility] Cardiologist: None  New consult completed by Dr. Mariah Milling  Subjective   Patient seen on a.m. rounds.  Denies any chest pain or shortness of breath.  Continues to remain hypotensive overnight.  Has been off of metoprolol and diltiazem for greater than 24 hours.  She continues to remain asymptomatic.  She has continued to receive her pain medication of oxycodone.  Started on different antibiotics for UTI yesterday.  Inpatient Medications    Scheduled Meds:  cephALEXin  500 mg Oral Q12H   cholecalciferol  2,000 Units Oral Daily   digoxin  0.25 mg Oral Q4H   divalproex  125 mg Oral BID   hyoscyamine  0.125 mg Oral BID   levothyroxine  100 mcg Oral Daily   lidocaine  1 patch Transdermal Daily   oxyCODONE  10 mg Oral Q8H   pantoprazole  40 mg Oral Daily   pregabalin  100 mg Oral BID   QUEtiapine  50 mg Oral QHS   ramelteon  8 mg Oral QHS   sodium chloride flush  3 mL Intravenous Q12H   sodium chloride flush  3 mL Intravenous Q12H   vortioxetine HBr  20 mg Oral Daily   Continuous Infusions:  sodium chloride     heparin 1,250 Units/hr (02/10/23 0700)   PRN Meds: sodium chloride, acetaminophen **OR** acetaminophen, HYDROcodone-acetaminophen, morphine injection, sodium chloride flush, SUMAtriptan, traZODone   Vital Signs    Vitals:   02/10/23 0015 02/10/23 0049 02/10/23 0453 02/10/23 0752  BP: 101/69 (!) 85/63 95/64 (!) 81/62  Pulse: (!) 151 90 72 80  Resp: 20 19 20 18   Temp: (!) 101 F (38.3 C) 98.2 F (36.8 C) (!) 97.4 F (36.3 C) 98.3 F (36.8 C)  TempSrc:  Oral    SpO2: 91% (!) 89% 94% 90%  Weight:      Height:        Intake/Output Summary (Last 24 hours) at 02/10/2023 1155 Last data filed at 02/10/2023 1042 Gross per 24 hour  Intake 1496.37 ml  Output 700 ml  Net 796.37 ml      02/07/2023    7:12 PM 02/01/2023    2:57 PM 01/22/2023    6:22 PM  Last 3 Weights   Weight (lbs) 224 lb 13.9 oz 224 lb 13.9 oz 225 lb  Weight (kg) 102 kg 102 kg 102.059 kg      Telemetry    Rate controlled atrial fibrillation- Personally Reviewed  ECG    No new tracings- Personally Reviewed  Physical Exam   GEN: No acute distress.  Frail. Neck: No JVD Cardiac: IR IR, no murmurs, rubs, or gallops.  Respiratory: Clear to auscultation bilaterally.  Respirations are unlabored at rest on 2 L of O2 via nasal cannula GI: Soft, nontender, non-distended  MS: No edema; No deformity. Neuro:  Nonfocal  Psych: Normal affect   Labs    High Sensitivity Troponin:   Recent Labs  Lab 02/07/23 2227 02/08/23 0047  TROPONINIHS 9 10     Chemistry Recent Labs  Lab 02/07/23 1933 02/07/23 2224 02/08/23 0513 02/09/23 0434 02/10/23 0746  NA 142  --  140 140 137  K 3.4*  --  3.3* 3.5 4.9  CL 105  --  106 105 103  CO2 29  --  24 27 29   GLUCOSE 52*  --  95 104* 102*  BUN 19  --  16  11 10  CREATININE 1.07*  --  0.79 0.94 0.85  CALCIUM 7.8*  --  7.9* 8.0* 8.5*  MG  --  1.8  --  1.9  --   PROT 6.3*  --  5.8* 5.5*  --   ALBUMIN 3.3*  --  2.9* 2.8*  --   AST 17  --  18 15  --   ALT 11  --  9 10  --   ALKPHOS 87  --  77 69  --   BILITOT 0.5  --  0.5 0.6  --   GFRNONAA 53*  --  >60 >60 >60  ANIONGAP 8  --  10 8 5     Lipids No results for input(s): "CHOL", "TRIG", "HDL", "LABVLDL", "LDLCALC", "CHOLHDL" in the last 168 hours.  Hematology Recent Labs  Lab 02/07/23 1933 02/08/23 0513 02/09/23 0434  WBC 5.2 7.2 6.6  RBC 4.34 4.19 3.81*  HGB 11.4* 10.9* 10.0*  HCT 36.5 35.1* 31.9*  MCV 84.1 83.8 83.7  MCH 26.3 26.0 26.2  MCHC 31.2 31.1 31.3  RDW 16.6* 16.7* 16.7*  PLT 157 144* 130*   Thyroid  Recent Labs  Lab 02/09/23 0433  TSH 3.566    BNP Recent Labs  Lab 02/07/23 1933  BNP 804.3*    DDimer No results for input(s): "DDIMER" in the last 168 hours.   Radiology    No results found.  Cardiac Studies  TTE 02/08/23 1. Left ventricular ejection  fraction, by estimation, is 60 to 65%. The  left ventricle has normal function. The left ventricle has no regional  wall motion abnormalities. There is mild left ventricular hypertrophy.  Left ventricular diastolic parameters  are indeterminate.   2. Right ventricular systolic function is normal. The right ventricular  size is normal. There is mildly elevated pulmonary artery systolic  pressure.   3. Left atrial size was moderately dilated.   4. The mitral valve is normal in structure. Mild to moderate mitral valve  regurgitation. No evidence of mitral stenosis.   5. The aortic valve has an indeterminant number of cusps. Aortic valve  regurgitation is mild to moderate. No aortic stenosis is present.   6. The inferior vena cava is normal in size with greater than 50%  respiratory variability, suggesting right atrial pressure of 3 mmHg.   7. Agitated saline contrast bubble study was negative, with no evidence  of any interatrial shunt.    Patient Profile     77 y.o. female with a history of seizures, hypothyroidism, depression, status post multiple mechanical falls, iron deficiency anemia, anxiety, panic attacks, arthritis, gastroesophageal reflux disease, migraines, who has been seen and evaluated for new onset atrial fibrillation RVR.  Assessment & Plan    New onset atrial fibrillation of unknown duration -Patient remains in atrial fibrillation on telemetry -Originally was on diltiazem drip and transition to oral diltiazem and metoprolol but unfortunately developed hypotension -Home medications have been held for greater than 24 hours and she continued to exhibit hypotension overnight -Started on digoxin 0.25 mg x 2 doses today and will decrease to 0.125 mg starting tomorrow will need to dig level by middle of next week -Continued on heparin infusion for CHA2DS2-VASc score of at least 3 for stroke prophylaxis -Will need to be transition to apixaban prior to discharge -Continue with  telemetry monitoring  Hypotension -Blood pressure 81/62 -Currently on no antihypertensive or AV nodal blocking agents -Continues to receive pain medication -Possibly a component of UTI -Vital signs per unit  protocol  Iron deficiency anemia -Hemoglobin 10.1 -Has remained stable on heparin infusion -Daily CBC  Chronic UTI -Urinalysis and urine culture completed -Patient started on Keflex by medicine team -Continued management by IM  History of multiple falls -Complains of increasing leg weakness -Larey Seat and was found on the floor at assisted living prior to hospitalization -Patient reports that she has had multiple falls  Ambulatory dysfunction -Evaluation by PT -Uses a walker at assisted living  Hypokalemia-resolved -Serum potassium 4.9 -Daily BMP -Monitor/trend/replete electrolytes as needed     For questions or updates, please contact  [facility] Please consult [email or web address] for contact info under        Signed, Johann Gascoigne, NP  02/10/2023, 11:55 AM

## 2023-02-11 DIAGNOSIS — R2681 Unsteadiness on feet: Secondary | ICD-10-CM | POA: Diagnosis not present

## 2023-02-11 DIAGNOSIS — D508 Other iron deficiency anemias: Secondary | ICD-10-CM | POA: Diagnosis not present

## 2023-02-11 DIAGNOSIS — W19XXXA Unspecified fall, initial encounter: Secondary | ICD-10-CM | POA: Diagnosis not present

## 2023-02-11 DIAGNOSIS — I4891 Unspecified atrial fibrillation: Secondary | ICD-10-CM | POA: Diagnosis not present

## 2023-02-11 DIAGNOSIS — I959 Hypotension, unspecified: Secondary | ICD-10-CM | POA: Diagnosis not present

## 2023-02-11 LAB — CBC
HCT: 32.2 % — ABNORMAL LOW (ref 36.0–46.0)
Hemoglobin: 10 g/dL — ABNORMAL LOW (ref 12.0–15.0)
MCH: 25.9 pg — ABNORMAL LOW (ref 26.0–34.0)
MCHC: 31.1 g/dL (ref 30.0–36.0)
MCV: 83.4 fL (ref 80.0–100.0)
Platelets: 142 10*3/uL — ABNORMAL LOW (ref 150–400)
RBC: 3.86 MIL/uL — ABNORMAL LOW (ref 3.87–5.11)
RDW: 16.7 % — ABNORMAL HIGH (ref 11.5–15.5)
WBC: 5.9 10*3/uL (ref 4.0–10.5)
nRBC: 0 % (ref 0.0–0.2)

## 2023-02-11 LAB — BASIC METABOLIC PANEL
Anion gap: 7 (ref 5–15)
BUN: 12 mg/dL (ref 8–23)
CO2: 30 mmol/L (ref 22–32)
Calcium: 8.2 mg/dL — ABNORMAL LOW (ref 8.9–10.3)
Chloride: 103 mmol/L (ref 98–111)
Creatinine, Ser: 0.8 mg/dL (ref 0.44–1.00)
GFR, Estimated: >60 mL/min (ref >60)
Glucose, Bld: 88 mg/dL (ref 70–99)
Potassium: 4.2 mmol/L (ref 3.5–5.1)
Sodium: 140 mmol/L (ref 135–145)

## 2023-02-11 LAB — HEPARIN LEVEL (UNFRACTIONATED): Heparin Unfractionated: 0.18 IU/mL — ABNORMAL LOW (ref 0.30–0.70)

## 2023-02-11 LAB — CULTURE, BLOOD (ROUTINE X 2): Culture: NO GROWTH

## 2023-02-11 MED ORDER — APIXABAN 5 MG PO TABS
5.0000 mg | ORAL_TABLET | Freq: Two times a day (BID) | ORAL | Status: DC
  Administered 2023-02-11 – 2023-02-16 (×11): 5 mg via ORAL
  Filled 2023-02-11 (×11): qty 1

## 2023-02-11 MED ORDER — HEPARIN BOLUS VIA INFUSION
2400.0000 [IU] | Freq: Once | INTRAVENOUS | Status: AC
  Administered 2023-02-11: 2400 [IU] via INTRAVENOUS
  Filled 2023-02-11: qty 2400

## 2023-02-11 MED ORDER — DIGOXIN 125 MCG PO TABS
0.1250 mg | ORAL_TABLET | Freq: Every day | ORAL | Status: DC
  Administered 2023-02-11 – 2023-02-13 (×3): 0.125 mg via ORAL
  Filled 2023-02-11 (×4): qty 1

## 2023-02-11 NOTE — Progress Notes (Signed)
Rounding Note    Patient Name: [name] Date of Encounter: 02/11/2023  [facility] Cardiologist: [facility]  Subjective   Resting in bed, no complaints Started on digoxin yesterday  Diltiazem metoprolol held more than 48 hours ago secondary to hypotension Heart rate 100,  Remains in atrial fibrillation On antibiotics for UTI  Inpatient Medications    Scheduled Meds:  apixaban  5 mg Oral BID   cephALEXin  500 mg Oral Q12H   cholecalciferol  2,000 Units Oral Daily   digoxin  0.125 mg Oral Daily   divalproex  125 mg Oral BID   hyoscyamine  0.125 mg Oral BID   levothyroxine  100 mcg Oral Daily   lidocaine  1 patch Transdermal Daily   oxyCODONE  10 mg Oral Q8H   pantoprazole  40 mg Oral Daily   pregabalin  100 mg Oral BID   QUEtiapine  50 mg Oral QHS   ramelteon  8 mg Oral QHS   sodium chloride flush  3 mL Intravenous Q12H   sodium chloride flush  3 mL Intravenous Q12H   vortioxetine HBr  20 mg Oral Daily   Continuous Infusions:  sodium chloride     PRN Meds: sodium chloride, acetaminophen **OR** acetaminophen, HYDROcodone-acetaminophen, morphine injection, sodium chloride flush, SUMAtriptan, traZODone   Vital Signs    Vitals:   02/10/23 2119 02/11/23 0040 02/11/23 0413 02/11/23 0752  BP: 113/83 (!) 90/55 107/69 112/71  Pulse: (!) 53 100 82 (!) 57  Resp: 16 19 17 16   Temp: 98.6 F (37 C) 98.2 F (36.8 C) 97.6 F (36.4 C) 97.6 F (36.4 C)  TempSrc: Oral Oral    SpO2: 97% 93% 95% 96%  Weight:      Height:        Intake/Output Summary (Last 24 hours) at 02/11/2023 1158 Last data filed at 02/11/2023 1049 Gross per 24 hour  Intake 599.15 ml  Output 1125 ml  Net -525.85 ml      02/07/2023    7:12 PM 02/01/2023    2:57 PM 01/22/2023    6:22 PM  Last 3 Weights  Weight (lbs) 224 lb 13.9 oz 224 lb 13.9 oz 225 lb  Weight (kg) 102 kg 102 kg 102.059 kg      Telemetry    Atrial fibrillation rate in the 90-100, 110 with exertion personally  Reviewed  ECG     - Personally Reviewed  Physical Exam   Constitutional:  oriented to person, place, and time. No distress.  HENT:  Head: Grossly normal Eyes:  no discharge. No scleral icterus.  Neck: No JVD, no carotid bruits  Cardiovascular: Irregularly irregular, no murmurs appreciated Pulmonary/Chest: Clear to auscultation bilaterally, no wheezes or rails Abdominal: Soft.  no distension.  no tenderness.  Musculoskeletal: Normal range of motion Neurological:  normal muscle tone. Coordination normal. No atrophy Skin: Skin warm and dry Psychiatric: normal affect, pleasant  Labs    High Sensitivity Troponin:   Recent Labs  Lab 02/07/23 2227 02/08/23 0047  TROPONINIHS 9 10     Chemistry Recent Labs  Lab 02/07/23 1933 02/07/23 2224 02/08/23 0513 02/09/23 0434 02/10/23 0746 02/11/23 0533  NA 142  --  140 140 137 140  K 3.4*  --  3.3* 3.5 4.9 4.2  CL 105  --  106 105 103 103  CO2 29  --  24 27 29 30   GLUCOSE 52*  --  95 104* 102* 88  BUN 19  --  16  11 10 12   CREATININE 1.07*  --  0.79 0.94 0.85 0.80  CALCIUM 7.8*  --  7.9* 8.0* 8.5* 8.2*  MG  --  1.8  --  1.9  --   --   PROT 6.3*  --  5.8* 5.5*  --   --   ALBUMIN 3.3*  --  2.9* 2.8*  --   --   AST 17  --  18 15  --   --   ALT 11  --  9 10  --   --   ALKPHOS 87  --  77 69  --   --   BILITOT 0.5  --  0.5 0.6  --   --   GFRNONAA 53*  --  >60 >60 >60 >60  ANIONGAP 8  --  10 8 5 7     Lipids No results for input(s): "CHOL", "TRIG", "HDL", "LABVLDL", "LDLCALC", "CHOLHDL" in the last 168 hours.  Hematology Recent Labs  Lab 02/09/23 0434 02/10/23 0746 02/11/23 0533  WBC 6.6 6.5 5.9  RBC 3.81* 3.88 3.86*  HGB 10.0* 10.1* 10.0*  HCT 31.9* 32.4* 32.2*  MCV 83.7 83.5 83.4  MCH 26.2 26.0 25.9*  MCHC 31.3 31.2 31.1  RDW 16.7* 17.0* 16.7*  PLT 130* 132* 142*   Thyroid  Recent Labs  Lab 02/09/23 0433  TSH 3.566    BNP Recent Labs  Lab 02/07/23 1933  BNP 804.3*    DDimer No results for input(s):  "DDIMER" in the last 168 hours.   Radiology    No results found.  Cardiac Studies  Echo   1. Left ventricular ejection fraction, by estimation, is 60 to 65%. The  left ventricle has normal function. The left ventricle has no regional  wall motion abnormalities. There is mild left ventricular hypertrophy.  Left ventricular diastolic parameters  are indeterminate.   2. Right ventricular systolic function is normal. The right ventricular  size is normal. There is mildly elevated pulmonary artery systolic  pressure.   3. Left atrial size was moderately dilated.   4. The mitral valve is normal in structure. Mild to moderate mitral valve  regurgitation. No evidence of mitral stenosis.   5. The aortic valve has an indeterminant number of cusps. Aortic valve  regurgitation is mild to moderate. No aortic stenosis is present.   6. The inferior vena cava is normal in size with greater than 50%  respiratory variability, suggesting right atrial pressure of 3 mmHg.   7. Agitated saline contrast bubble study was negative, with no evidence  of any interatrial shunt.    Patient Profile     [name] is a 77 year old woman with history of overt obesity, seizures, hypothyroidism, mechanical falls, iron deficiency anemia, anxiety/panic attacks, presenting from [facility] assisted facility after a fall Long discussion with her concerning home situation, she reports independent living, some assistance from staff and son.  Periodic falls, legs have been getting weaker.  This admission was transferring from couch to bed and she was not using her walker and went down, trauma to left lip.  Denies significant trauma to body Staff at the nursing facility found her on the floor In the ER noted to be in atrial fibrillation, initial blood pressure 80/40, hypoxia with saturations 88% on room air Given IV fluids, placed on nasal cannula oxygen Chest x-ray with cardiomegaly, pulmonary vascular  congestion CT scan head showing old bilateral frontoparietal infarcts CT chest abdomen pelvis T1 compression fracture, cardiomegaly No DVT  Assessment & Plan    Atrial fibrillation with RVR Prior EKGs dating back to September 2023, normal sinus rhythm  Timing of onset of atrial fibrillation unclear, possibly from mechanical fall this admission -Initially placed on diltiazem infusion and transition to diltiazem 60 every 6 hours with metoprolol to tartrate, medications held June 7 for hypotension Continue low-dose digoxin 0.125 mg daily, Eliquis 5 twice daily CHA2DS2-VASc score of at least 3  Needs to work with PT  Iron deficiency anemia Recent trend downward in her hemoglobin, 10. down from 12.3 two weeks ago Will transition off heparin to Eliquis Hemoglobin stable today, 10  Hypotension Blood pressure by her report is always low Continues to run low despite holding diltiazem and metoprolol Possibly exacerbated by urinary tract infection Atrial fibrillation rate controlling medications limited as above   Mechanical falls/leg weakness Reports worsening leg weakness during her time spent at [facility]   was transitioning from couch to bed without her walker Needs to work closely with PT Also need to determine if she needs a higher level of care, assisted living  Long discussion concerning debility, transition to anticoagulation/Eliquis, low blood pressure  Total encounter time more than 50 minutes  Greater than 50% was spent in counseling and coordination of care with the patient   For questions or updates, please contact [facility] Please consult [email or web address] for contact info under        Signed, Julien Nordmann, MD  02/11/2023, 11:58 AM

## 2023-02-11 NOTE — TOC Initial Note (Signed)
Transition of Care [facility]) - Initial/Assessment Note    Patient Details  Name: [name] MRN: [id number] Date of Birth: 29-Aug-1946  [facility] CM/SW Contact:    Kemper Durie, RN Phone Number: 02/11/2023, 4:26 PM  Clinical Narrative:                  Patient admitted from [facility] ALF. Spoke with son regarding recommendations for SNF for short term rehab, agrees, does not have preference.  SNF work up done, bed search initiated.   Expected Discharge Plan: Skilled Nursing Facility Barriers to Discharge: Continued Medical Work up   Patient Goals and CMS Choice            Expected Discharge Plan and Services     Post Acute Care Choice: Skilled Nursing Facility Living arrangements for the past 2 months: Assisted Living Facility                                      Prior Living Arrangements/Services Living arrangements for the past 2 months: Assisted Living Facility Lives with:: Facility Resident              Current home services: DME    Activities of Daily Living Home Assistive Devices/Equipment: Environmental consultant (specify type) ADL Screening (condition at time of admission) Patient's cognitive ability adequate to safely complete daily activities?: Yes Is the patient deaf or have difficulty hearing?: No Does the patient have difficulty seeing, even when wearing glasses/contacts?: No Does the patient have difficulty concentrating, remembering, or making decisions?: No Patient able to express need for assistance with ADLs?: Yes Does the patient have difficulty dressing or bathing?: Yes Independently performs ADLs?: No Does the patient have difficulty walking or climbing stairs?: Yes Weakness of Legs: Both Weakness of Arms/Hands: Both  Permission Sought/Granted                  Emotional Assessment              Admission diagnosis:  Fall [W19.XXXA] Acute cystitis without hematuria [N30.00] Atrial fibrillation with RVR (HCC)  [I48.91] Patient Active Problem List   Diagnosis Date Noted   Fall 02/08/2023   Atrial fibrillation with RVR (HCC) 02/08/2023   Acute cystitis without hematuria 02/07/2023   Paroxysmal atrial fibrillation (HCC) 02/07/2023   AKI (acute kidney injury) (HCC) 02/07/2023   Hypokalemia 02/07/2023   Falls 11/20/2022   Gastroesophageal reflux disease 09/24/2022   Dysuria 10/24/2021   Morbid (severe) obesity due to excess calories (HCC) 10/04/2021   Chronic migraine w/o aura w/o status migrainosus, not intractable 05/19/2021   Dementia with behavioral disturbance (HCC) 05/19/2021   Folliculitis 12/27/2020   Rash 12/27/2020   Abnormal weight loss 10/22/2020   Morbid obesity (HCC) 10/22/2020   Nausea 10/22/2020   Long-term current use of opiate analgesic 12/01/2019   Encounter for Medicare annual wellness exam 12/25/2018   Chronic back pain 09/05/2018   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   History of gastrointestinal hemorrhage 09/05/2018   Insomnia 09/05/2018   Cognitive impairment 05/07/2018   Age related osteoporosis 03/12/2018   On long term drug therapy 09/07/2017   Left-sided chest wall pain 07/25/2017   Hypotension 01/22/2017   Cerebrovascular accident (CVA) due to thrombosis of precerebral artery (HCC) 12/05/2016   Neck pain 12/05/2016   Gait instability 12/05/2016   Paronychia of finger of right hand 08/22/2016   Osteoarthritis of right knee 11/22/2015  Right knee pain 10/14/2015   Actinic keratoses 07/02/2015   Chest wall tenderness 04/27/2014   Foot drop, left 04/14/2014   Traumatic injury of lower extremity 03/25/2014   Obesity (BMI 30.0-34.9) 01/30/2014   Healthcare maintenance 01/30/2014   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   Diastolic CHF (HCC) 02/27/2013   Fibromyalgia 08/14/2012   Numbness in feet 08/08/2012   GERD with stricture 03/04/2012   Leg edema 03/04/2012   CATARACT, LEFT EYE 08/23/2010   Abnormality of gait 08/23/2010    Osteoarthritis 02/24/2010   Iron deficiency anemia 03/06/2009   DEPENDENCE, OPIOID, 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/26/2006   SYMPTOM, INCONTINENCE W/O SENSORY AWARENESS 12/26/2006   Memory loss 12/26/2006   Hypothyroidism 11/01/2006   DEPRESSION, MAJOR, RECURRENT 11/01/2006   PANIC ATTACKS 11/01/2006   Migraine headache 11/01/2006   Carpal tunnel syndrome 11/01/2006   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 - [place], [place] - [address] [place] [address] Phone: [phone] Fax: [phone]  [facility] - [place], [place] - [address] [place] [address] Phone: [phone] Fax: [phone]  [facility] - [place], [place] - [address] [place] [address] Phone: [phone] Fax: [phone]     Social Determinants of Health (SDOH) Social History: SDOH Screenings   Food Insecurity: No Food Insecurity (02/09/2023)  Housing: Low Risk  (02/09/2023)  Transportation Needs: No Transportation Needs (02/09/2023)  Utilities: Not At Risk (02/09/2023)  Depression (PHQ2-9): High Risk (03/31/2022)  Tobacco Use: Low Risk  (02/09/2023)   SDOH Interventions:     Readmission Risk Interventions     No data to display

## 2023-02-11 NOTE — Plan of Care (Signed)

## 2023-02-11 NOTE — Progress Notes (Signed)
Physical Therapy Treatment Patient Details Name: [name] MRN: [id number] DOB: 13-Sep-1945 Today's Date: 02/11/2023   History of Present Illness Pt is a 77 y.o. female with history of overt obesity, seizures, hypothyroidism, mechanical falls, iron deficiency anemia, anxiety/panic attacks. Pt admitted to ED 6/5 after a fall at [facility], no significant trauma to body reported.    PT Comments    Pt was pleasant and motivated to participate in therapy. She reported needing to have a BM so session included helping her transfer to [facility]. Pt able to perform all bed mobility tasks mod I. Pt able to perform several STS at EOB with min A for elevation to full standing and heavy UE use on RW. Once standing she could take very small shuffling steps with min A for stability to the [facility]. O2 remained in low to mid 90's throughout session on 2 L, pt reported no dizziness or lightheadedness. Pt left using BSC with call bell in reach. Pt will benefit from continued PT services upon discharge to safely address deficits listed in patient problem list for decreased caregiver assistance and eventual return to PLOF.   Recommendations for follow up therapy are one component of a multi-disciplinary discharge planning process, led by the attending physician.  Recommendations may be updated based on patient status, additional functional criteria and insurance authorization.  Follow Up Recommendations  Can patient physically be transported by private vehicle: No    Assistance Recommended at Discharge Intermittent Supervision/Assistance  Patient can return home with the following A lot of help with walking and/or transfers;A little help with bathing/dressing/bathroom;Help with stairs or ramp for entrance;Assist for transportation   Equipment Recommendations  Other (comment) (TBD at next venue of care)    Recommendations for Other Services       Precautions / Restrictions Precautions Precautions:  Fall Restrictions Weight Bearing Restrictions: No     Mobility  Bed Mobility Overal bed mobility: Needs Assistance Bed Mobility: Supine to Sit     Supine to sit: Modified independent (Device/Increase time)     General bed mobility comments: Increased time and effort for supine > sit, UE support on bed rails    Transfers Overall transfer level: Needs assistance Equipment used: Rolling walker (2 wheels) Transfers: Sit to/from Stand Sit to Stand: Min assist           General transfer comment: Min A for elevation to come to upright standing, heavy use of UE's on RW    Ambulation/Gait Ambulation/Gait assistance: Min assist Gait Distance (Feet): 4 Feet Assistive device: Rolling walker (2 wheels) Gait Pattern/deviations: Step-to pattern, Decreased step length - right, Decreased step length - left, Shuffle Gait velocity: decreased     General Gait Details: Min A for stability. Pt able to take small shuffling steps at EOB to get to [facility]. Heavy use of UE's on RW.   Stairs             Wheelchair Mobility    Modified Rankin (Stroke Patients Only)       Balance Overall balance assessment: Needs assistance Sitting-balance support: No upper extremity supported, Feet unsupported Sitting balance-Leahy Scale: Good Sitting balance - Comments: Able to sit EOB x 5 mins with no LOB   Standing balance support: During functional activity, Bilateral upper extremity supported, Reliant on assistive device for balance Standing balance-Leahy Scale: Poor Standing balance comment: Could maintain standing balance but needed BUE to feel stable.  Cognition Arousal/Alertness: Awake/alert Behavior During Therapy: WFL for tasks assessed/performed Overall Cognitive Status: Within Functional Limits for tasks assessed                                          Exercises Total Joint Exercises Ankle Circles/Pumps: AROM, 10 reps,  Supine, Strengthening Quad Sets: AROM, Strengthening, 10 reps, Supine Gluteal Sets: AROM, Strengthening, Supine, 10 reps Other Exercises Other Exercises: 3 STS EOB    General Comments        Pertinent Vitals/Pain Pain Assessment Pain Assessment: No/denies pain Pain Location: No pain other than headache Pain Intervention(s): Monitored during session, Repositioned    Home Living                          Prior Function            PT Goals (current goals can now be found in the care plan section) Progress towards PT goals: Progressing toward goals    Frequency    Min 4X/week      PT Plan Current plan remains appropriate    Co-evaluation              AM-PAC PT "6 Clicks" Mobility   Outcome Measure  Help needed turning from your back to your side while in a flat bed without using bedrails?: A Little Help needed moving from lying on your back to sitting on the side of a flat bed without using bedrails?: A Little Help needed moving to and from a bed to a chair (including a wheelchair)?: A Lot Help needed standing up from a chair using your arms (e.g., wheelchair or bedside chair)?: A Lot Help needed to walk in hospital room?: A Lot Help needed climbing 3-5 steps with a railing? : Total 6 Click Score: 13    End of Session Equipment Utilized During Treatment: Gait belt;Oxygen Activity Tolerance: Patient tolerated treatment well Patient left: Other (comment) (Using BSC with call bell in reach) Nurse Communication: Mobility status;Precautions PT Visit Diagnosis: Unsteadiness on feet (R26.81);Muscle weakness (generalized) (M62.81);History of falling (Z91.81)     Time: 2536-6440 PT Time Calculation (min) (ACUTE ONLY): 29 min  Charges:                        Anaeli Cornwall, SPT 02/11/23, 12:49 PM

## 2023-02-11 NOTE — NC FL2 (Signed)
[place] MEDICAID FL2 LEVEL OF CARE FORM     IDENTIFICATION  Patient Name: [name] Birthdate: [birth year] Sex: female Admission Date (Current Location): 02/07/2023  [facility] and [place] Number:  Chiropodist and Address:  [facility], [address] [place], [place] [address]      Provider Number: [id number]  Attending Physician Name and Address:  Charise Killian, MD  Relative Name and Phone Number:  Jacquelynne, Guedes [facility]) [phone] [facility])    Current Level of Care: Hospital Recommended Level of Care: Skilled Nursing Facility Prior Approval Number:    Date Approved/Denied:   PASRR Number: [id number]  Discharge Plan: SNF    Current Diagnoses: Patient Active Problem List   Diagnosis Date Noted   Fall 02/08/2023   Atrial fibrillation with RVR (HCC) 02/08/2023   Acute cystitis without hematuria 02/07/2023   Paroxysmal atrial fibrillation (HCC) 02/07/2023   AKI (acute kidney injury) (HCC) 02/07/2023   Hypokalemia 02/07/2023   Falls 11/20/2022   Gastroesophageal reflux disease 09/24/2022   Dysuria 10/24/2021   Morbid (severe) obesity due to excess calories (HCC) 10/04/2021   Chronic migraine w/o aura w/o status migrainosus, not intractable 05/19/2021   Dementia with behavioral disturbance (HCC) 05/19/2021   Folliculitis 12/27/2020   Rash 12/27/2020   Abnormal weight loss 10/22/2020   Morbid obesity (HCC) 10/22/2020   Nausea 10/22/2020   Long-term current use of opiate analgesic 12/01/2019   Encounter for Medicare annual wellness exam 12/25/2018   Chronic back pain 09/05/2018   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   History of gastrointestinal hemorrhage 09/05/2018   Insomnia 09/05/2018   Cognitive impairment 05/07/2018   Age related osteoporosis 03/12/2018   On long term drug therapy 09/07/2017   Left-sided chest wall pain 07/25/2017   Hypotension 01/22/2017   Cerebrovascular accident (CVA) due to thrombosis of precerebral  artery (HCC) 12/05/2016   Neck pain 12/05/2016   Gait instability 12/05/2016   Paronychia of finger of right hand 08/22/2016   Osteoarthritis of right knee 11/22/2015   Right knee pain 10/14/2015   Actinic keratoses 07/02/2015   Chest wall tenderness 04/27/2014   Foot drop, left 04/14/2014   Traumatic injury of lower extremity 03/25/2014   Obesity (BMI 30.0-34.9) 01/30/2014   Healthcare maintenance 01/30/2014   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   Diastolic CHF (HCC) 02/27/2013   Fibromyalgia 08/14/2012   Numbness in feet 08/08/2012   GERD with stricture 03/04/2012   Leg edema 03/04/2012   CATARACT, LEFT EYE 08/23/2010   Abnormality of gait 08/23/2010   Osteoarthritis 02/24/2010   Iron deficiency anemia 03/06/2009   DEPENDENCE, OPIOID, 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/26/2006   SYMPTOM, INCONTINENCE W/O SENSORY AWARENESS 12/26/2006   Memory loss 12/26/2006   Hypothyroidism 11/01/2006   DEPRESSION, MAJOR, RECURRENT 11/01/2006   PANIC ATTACKS 11/01/2006   Migraine headache 11/01/2006   Carpal tunnel syndrome 11/01/2006   TMJ SYNDROME 11/01/2006   Irritable bowel syndrome 11/01/2006   OSTEOPENIA 11/01/2006    Orientation RESPIRATION BLADDER Height & Weight     Self, Time, Situation, Place  Normal Incontinent Weight: 102 kg Height:  5\' 5"  (165.1 cm)  BEHAVIORAL SYMPTOMS/MOOD NEUROLOGICAL BOWEL NUTRITION STATUS      Continent Diet  AMBULATORY STATUS COMMUNICATION OF NEEDS Skin   Limited Assist Verbally Bruising                       Personal Care Assistance Level of Assistance  Bathing, Dressing Bathing Assistance: Limited assistance  Functional Limitations Info             SPECIAL CARE FACTORS FREQUENCY  PT (By licensed PT), OT (By licensed OT)     PT Frequency: 5 times a week OT Frequency: 5 times a week            Contractures Contractures Info: Not present    Additional Factors Info  Code Status, Allergies Code Status Info:  full Allergies Info: Bupropion Hcl Not Specified Contraindication Other (See Comments) SEIZURES!! Other Not Specified    Penicillins Not Specified    Tilactase Not Specified Allergy Diarrhea  Azithromycin Low Allergy Rash  Erythromycin           Current Medications (02/11/2023):  This is the current hospital active medication list Current Facility-Administered Medications  Medication Dose Route Frequency Provider Last Rate Last Admin   0.9 %  sodium chloride infusion  250 mL Intravenous PRN Gertha Calkin, MD       acetaminophen (TYLENOL) tablet 650 mg  650 mg Oral Q6H PRN Gertha Calkin, MD       Or   acetaminophen (TYLENOL) suppository 650 mg  650 mg Rectal Q6H PRN Gertha Calkin, MD       apixaban Everlene Balls) tablet 5 mg  5 mg Oral BID Hammock, Sheri, NP   5 mg at 02/11/23 1224   cephALEXin (KEFLEX) capsule 500 mg  500 mg Oral Q12H Kathrynn Running, MD   500 mg at 02/11/23 0851   cholecalciferol (VITAMIN D3) 25 MCG (1000 UNIT) tablet 2,000 Units  2,000 Units Oral Daily Harold Hedge, MD   2,000 Units at 02/11/23 0851   digoxin (LANOXIN) tablet 0.125 mg  0.125 mg Oral Daily Antonieta Iba, MD   0.125 mg at 02/11/23 1224   divalproex (DEPAKOTE) DR tablet 125 mg  125 mg Oral BID Harold Hedge, MD   125 mg at 02/11/23 0850   HYDROcodone-acetaminophen (NORCO/VICODIN) 5-325 MG per tablet 1 tablet  1 tablet Oral Q4H PRN Gertha Calkin, MD   1 tablet at 02/08/23 2335   hyoscyamine (ANASPAZ) disintergrating tablet 0.125 mg  0.125 mg Oral BID Mila Merry A, RPH   0.125 mg at 02/11/23 0850   levothyroxine (SYNTHROID) tablet 100 mcg  100 mcg Oral Daily Harold Hedge, MD   100 mcg at 02/11/23 0505   lidocaine (LIDODERM) 5 % 1 patch  1 patch Transdermal Daily Pudota, Elsie Ra, MD   1 patch at 02/11/23 0851   morphine (PF) 2 MG/ML injection 2 mg  2 mg Intravenous Q4H PRN Gertha Calkin, MD       oxyCODONE (Oxy IR/ROXICODONE) immediate release tablet 10 mg  10 mg Oral Q8H Pudota, Elsie Ra, MD   10 mg at 02/11/23 1301   pantoprazole (PROTONIX) EC tablet 40 mg  40 mg Oral Daily Harold Hedge, MD   40 mg at 02/11/23 0851   pregabalin (LYRICA) capsule 100 mg  100 mg Oral BID Harold Hedge, MD   100 mg at 02/11/23 0851   QUEtiapine (SEROQUEL XR) 24 hr tablet 50 mg  50 mg Oral QHS Harold Hedge, MD   50 mg at 02/10/23 2102   ramelteon (ROZEREM) tablet 8 mg  8 mg Oral QHS Harold Hedge, MD   8 mg at 02/10/23 2102   sodium chloride flush (NS) 0.9 % injection 3 mL  3 mL Intravenous Q12H Gertha Calkin, MD   3 mL at  02/11/23 0852   sodium chloride flush (NS) 0.9 % injection 3 mL  3 mL Intravenous Q12H Irena Cords V, MD   3 mL at 02/10/23 2103   sodium chloride flush (NS) 0.9 % injection 3 mL  3 mL Intravenous PRN Gertha Calkin, MD       SUMAtriptan (IMITREX) tablet 25 mg  25 mg Oral Q2H PRN Wouk, Wilfred Curtis, MD       traZODone (DESYREL) tablet 50-150 mg  50-150 mg Oral QHS PRN Pudota, Elsie Ra, MD       vortioxetine HBr (TRINTELLIX) tablet 20 mg  20 mg Oral Daily Harold Hedge, MD   20 mg at 02/11/23 1884     Discharge Medications: Please see discharge summary for a list of discharge medications.  Relevant Imaging Results:  Relevant Lab Results:   Additional Information [id number]  Kemper Durie, RN

## 2023-02-11 NOTE — Progress Notes (Addendum)
PROGRESS NOTE     HPI was taken from Dr. Irena Cords:  [name] is a 77 y.o. female with medical history significant for seizures, hypothyroidism, depression, cardiac history presented at [facility]. Patient has a history of falls as well .  Today she was transferring from recliner at baseline she is able to ambulate and stand without assistance but today she had pain. No Reports tingling fevers chills headaches blurred vision.  Patient does have some soreness in her right knee as well the right is a deformity of the right ankle Considered high risk for falls.  Hip x-ray is negative for any acute fracture or dislocation right knee that was x-rayed in the emergency room shows tricompartmental degenerative changes neutropenia. 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  Has gone down and blood pressure has improved.  Patient was therefore started on diltiazem drip and white 12 send blood pressure has  Improved.  EMS gave patient 500 cc normal saline.  Patient does not report any shortness of breath.  As per Dr. Ashok Pall: 6/6: Patient has responded to Cardizem infusion heart rate improved. However patient is hypoxic needing 2 L of oxygen via nasal cannula. Cardiology weaning Cardizem drip and initiating on p.o. Cardizem and metoprolol tartrate.     [name]  UJW:[id number] DOB: 1945-12-18 DOA: 02/07/2023 PCP: Smiley Houseman, NP   Assessment & Plan:   Principal Problem:   Falls Active Problems:   Paroxysmal atrial fibrillation (HCC)   Hypotension   AKI (acute kidney injury) (HCC)   Acute cystitis without hematuria   History of gastrointestinal hemorrhage   GERD with stricture   Hypokalemia   Hypothyroidism   Dementia with behavioral disturbance (HCC)   DEPENDENCE, OPIOID, CONTINUOUS   Obesity (BMI 30.0-34.9)   Cerebrovascular accident (CVA) due to thrombosis of  precerebral artery (HCC)   Gait instability   Fall   Atrial fibrillation with RVR (HCC)  Assessment and Plan: Recurrent falls: PT/OT recs SNF. Pt and pt's son are agreeable to SNF. CT head shows no acute intracranial abnormalities  A.fib: w/ RVR. D/c IV heparin and started eliquis as per cardio. Continue on digoxin and continue to hold metoprolol, diltiazem as per cardio    UTI: urine cx growing aerococcus. Continue on keflex    Thrombocytopenia: etiology unclear. Trending up today. Will continue to monitor    Chronic diarrhea: etiology unclear. Possibly medication induced vs food induced vs microscopic colitis. Continue w/ supportive care. GI PCR panel ordered.    B/l LE edema:etiology unclear, ? venous insufficiency. Echo shows EF 60-65%, diastolic function is indeterminate.  [place] of b/l LE neg for DVTs.         DVT prophylaxis: eliquis  Code Status: full  Family Communication: discussed pt's care w/ pt's son, [name], and answered his questions  Disposition Plan: pt is agreeable to SNF  Level of care: Telemetry Cardiac  Status is: Inpatient Remains inpatient appropriate because: severity of illness    Consultants:  Cardio   Procedures:   Antimicrobials: keflex   Subjective: Pt c/o malaise   Objective: Vitals:   02/10/23 2119 02/11/23 0040 02/11/23 0413 02/11/23 0752  BP: 113/83 (!) 90/55 107/69 112/71  Pulse: (!) 53 100 82 (!) 57  Resp: 16 19 17 16   Temp: 98.6 F (37 C) 98.2 F (36.8 C) 97.6 F (36.4 C) 97.6 F (  36.4 C)  TempSrc: Oral Oral    SpO2: 97% 93% 95% 96%  Weight:      Height:        Intake/Output Summary (Last 24 hours) at 02/11/2023 0902 Last data filed at 02/10/2023 1536 Gross per 24 hour  Intake 776.69 ml  Output 300 ml  Net 476.69 ml   Filed Weights   02/07/23 1912  Weight: 102 kg    Examination:  General exam: Appears comfortable  Respiratory system: clear breath sounds b/l  Cardiovascular system: irregularly irregular   Gastrointestinal system: Abd is soft, NT, obese & hyperactive bowel sounds  Central nervous system: Alert and awake. Moves all extremities  Psychiatry: Judgement and insight appears at baseline. Appropriate mood and affect    Data Reviewed: I have personally reviewed following labs and imaging studies  CBC: Recent Labs  Lab 02/07/23 1933 02/08/23 0513 02/09/23 0434 02/10/23 0746 02/11/23 0533  WBC 5.2 7.2 6.6 6.5 5.9  NEUTROABS 3.1  --  3.9  --   --   HGB 11.4* 10.9* 10.0* 10.1* 10.0*  HCT 36.5 35.1* 31.9* 32.4* 32.2*  MCV 84.1 83.8 83.7 83.5 83.4  PLT 157 144* 130* 132* 142*   Basic Metabolic Panel: Recent Labs  Lab 02/07/23 1933 02/07/23 2224 02/08/23 0513 02/09/23 0433 02/09/23 0434 02/10/23 0746 02/11/23 0533  NA 142  --  140  --  140 137 140  K 3.4*  --  3.3*  --  3.5 4.9 4.2  CL 105  --  106  --  105 103 103  CO2 29  --  24  --  27 29 30   GLUCOSE 52*  --  95  --  104* 102* 88  BUN 19  --  16  --  11 10 12   CREATININE 1.07*  --  0.79  --  0.94 0.85 0.80  CALCIUM 7.8*  --  7.9*  --  8.0* 8.5* 8.2*  MG  --  1.8  --   --  1.9  --   --   PHOS  --   --   --  3.7  --   --   --    GFR: Estimated Creatinine Clearance: 67.5 mL/min (by C-G formula based on SCr of 0.8 mg/dL). Liver Function Tests: Recent Labs  Lab 02/07/23 1933 02/08/23 0513 02/09/23 0434  AST 17 18 15   ALT 11 9 10   ALKPHOS 87 77 69  BILITOT 0.5 0.5 0.6  PROT 6.3* 5.8* 5.5*  ALBUMIN 3.3* 2.9* 2.8*   No results for input(s): "LIPASE", "AMYLASE" in the last 168 hours. No results for input(s): "AMMONIA" in the last 168 hours. Coagulation Profile: Recent Labs  Lab 02/07/23 1933  INR 1.2   Cardiac Enzymes: No results for input(s): "CKTOTAL", "CKMB", "CKMBINDEX", "TROPONINI" in the last 168 hours. BNP (last 3 results) No results for input(s): "PROBNP" in the last 8760 hours. HbA1C: No results for input(s): "HGBA1C" in the last 72 hours. CBG: Recent Labs  Lab 02/09/23 0315  GLUCAP 94    Lipid Profile: No results for input(s): "CHOL", "HDL", "LDLCALC", "TRIG", "CHOLHDL", "LDLDIRECT" in the last 72 hours. Thyroid Function Tests: Recent Labs    02/09/23 0433  TSH 3.566   Anemia Panel: No results for input(s): "VITAMINB12", "FOLATE", "FERRITIN", "TIBC", "IRON", "RETICCTPCT" in the last 72 hours. Sepsis Labs: Recent Labs  Lab 02/07/23 1933  LATICACIDVEN 1.9    Recent Results (from the past 240 hour(s))  Blood Culture (routine x 2)  Status: None (Preliminary result)   Collection Time: 02/07/23  7:27 PM   Specimen: BLOOD  Result Value Ref Range Status   Specimen Description BLOOD BLOOD LEFT FOREARM  Final   Special Requests   Final    BOTTLES DRAWN AEROBIC AND ANAEROBIC Blood Culture adequate volume   Culture   Final    NO GROWTH 4 DAYS Performed at [facility], [address], [place], [place] [address]    Report Status PENDING  Incomplete  Blood Culture (routine x 2)     Status: None (Preliminary result)   Collection Time: 02/07/23  7:32 PM   Specimen: BLOOD  Result Value Ref Range Status   Specimen Description BLOOD BLOOD RIGHT ARM  Final   Special Requests   Final    BOTTLES DRAWN AEROBIC AND ANAEROBIC Blood Culture results may not be optimal due to an excessive volume of blood received in culture bottles   Culture   Final    NO GROWTH 4 DAYS Performed at [facility], [address] [place], [place] [address]    Report Status PENDING  Incomplete  Urine Culture     Status: Abnormal   Collection Time: 02/07/23  8:47 PM   Specimen: Urine, Random  Result Value Ref Range Status   Specimen Description   Final    URINE, RANDOM Performed at [facility], [address] [place], [place] [address]    Special Requests   Final    NONE Reflexed from [id number] Performed at [facility], [address] [place], [place] [address]    Culture (A)  Final    >=100,000 COLONIES/mL AEROCOCCUS SPECIES Standardized  susceptibility testing for this organism is not available. Performed at [facility], [address] [place], [place] [address]    Report Status 02/09/2023 FINAL  Final         Radiology Studies: No results found.      Scheduled Meds:  cephALEXin  500 mg Oral Q12H   cholecalciferol  2,000 Units Oral Daily   divalproex  125 mg Oral BID   hyoscyamine  0.125 mg Oral BID   levothyroxine  100 mcg Oral Daily   lidocaine  1 patch Transdermal Daily   oxyCODONE  10 mg Oral Q8H   pantoprazole  40 mg Oral Daily   pregabalin  100 mg Oral BID   QUEtiapine  50 mg Oral QHS   ramelteon  8 mg Oral QHS   sodium chloride flush  3 mL Intravenous Q12H   sodium chloride flush  3 mL Intravenous Q12H   vortioxetine HBr  20 mg Oral Daily   Continuous Infusions:  sodium chloride     heparin 1,400 Units/hr (02/11/23 0646)     LOS: 3 days    Time spent: 25 mins     Charise Killian, MD [facility] Pager [phone]  If 7PM-7AM, please contact night-coverage [email or web address] 02/11/2023, 9:02 AM

## 2023-02-11 NOTE — Progress Notes (Signed)
ANTICOAGULATION CONSULT NOTE  Pharmacy Consult for Heparin  Indication: atrial fibrillation  Allergies  Allergen Reactions   Bupropion Hcl Other (See Comments)    SEIZURES!!   Other    Penicillins    Tilactase Diarrhea   Azithromycin Rash   Erythromycin Rash    Patient Measurements: Height: 5\' 5"  (165.1 cm) Weight: 102 kg (224 lb 13.9 oz) IBW/kg (Calculated) : 57 Heparin Dosing Weight: 80.5 kg   Vital Signs: Temp: 97.6 F (36.4 C) (06/09 0413) Temp Source: Oral (06/09 0040) BP: 107/69 (06/09 0413) Pulse Rate: 82 (06/09 0413)  Labs: Recent Labs    02/09/23 0434 02/10/23 0605 02/10/23 0746 02/11/23 0533  HGB 10.0*  --  10.1* 10.0*  HCT 31.9*  --  32.4* 32.2*  PLT 130*  --  132* 142*  HEPARINUNFRC 0.42 0.33  --  0.18*  CREATININE 0.94  --  0.85 0.80     Estimated Creatinine Clearance: 67.5 mL/min (by C-G formula based on SCr of 0.8 mg/dL).   Medical History: Past Medical History:  Diagnosis Date   Anemia    took Fe- orally 2 yrs. ago    Anxiety    panic attacks    Arthritis    shoulders, knees   Cancer (HCC)    skin- preCA   Depression    GERD (gastroesophageal reflux disease)    uses Protonix as needed    Headache(784.0)    Humerus fracture    Right    Hyposmolality and/or hyponatremia 02/27/2013   Hypothyroidism    IBS (irritable bowel syndrome)    Injury of left foot 09/05/2018   Injury of right hand 04/27/2014   Memory loss    Mental disorder    Migraine    Preoperative examination 02/26/2013   Seizures (HCC)    caused by depression , seritonin seizure - effected short term memory     Shortness of breath     Assessment: Pharmacy consulted to dose heparin in this 77 year old female admitted with AFib.  Per MD, pt is fall risk so will use heparin for anticoag.   No prior anticoag noted.  CrCl = 50.5 ml/min   6/6 1016 HL 0.49, therapeutic x 1 6/6 1841 HL 0.51, therapeutic X 2 6/7 0434 HL 0.42, therapeutic X 3 6/8 0605 HL 0.33,  therapeutic X 4  6/9 0533 HL 0.18, SUBtherapeutic    Goal of Therapy:  Heparin level 0.3-0.7 units/ml Monitor platelets by anticoagulation protocol: Yes   Plan:  6/9:  HL @ 0533 = 0.18, SUBtherapeutic - Will order heparin 2400 units IV X 1 bolus and increase drip rate to 1400 units/hr. - Will recheck HL 8 hrs after rate change   Evalena Fujii D, PharmD 02/11/2023,6:36 AM

## 2023-02-12 DIAGNOSIS — I959 Hypotension, unspecified: Secondary | ICD-10-CM | POA: Diagnosis not present

## 2023-02-12 DIAGNOSIS — W19XXXA Unspecified fall, initial encounter: Secondary | ICD-10-CM | POA: Diagnosis not present

## 2023-02-12 DIAGNOSIS — I4891 Unspecified atrial fibrillation: Secondary | ICD-10-CM | POA: Diagnosis not present

## 2023-02-12 LAB — CBC
HCT: 36.3 % (ref 36.0–46.0)
Hemoglobin: 11.5 g/dL — ABNORMAL LOW (ref 12.0–15.0)
MCH: 25.7 pg — ABNORMAL LOW (ref 26.0–34.0)
MCHC: 31.7 g/dL (ref 30.0–36.0)
MCV: 81 fL (ref 80.0–100.0)
Platelets: 158 10*3/uL (ref 150–400)
RBC: 4.48 MIL/uL (ref 3.87–5.11)
RDW: 16.1 % — ABNORMAL HIGH (ref 11.5–15.5)
WBC: 6.8 10*3/uL (ref 4.0–10.5)
nRBC: 0 % (ref 0.0–0.2)

## 2023-02-12 LAB — BASIC METABOLIC PANEL
Anion gap: 7 (ref 5–15)
BUN: 14 mg/dL (ref 8–23)
CO2: 28 mmol/L (ref 22–32)
Calcium: 8.4 mg/dL — ABNORMAL LOW (ref 8.9–10.3)
Chloride: 100 mmol/L (ref 98–111)
Creatinine, Ser: 0.82 mg/dL (ref 0.44–1.00)
GFR, Estimated: >60 mL/min (ref >60)
Glucose, Bld: 93 mg/dL (ref 70–99)
Potassium: 4.3 mmol/L (ref 3.5–5.1)
Sodium: 135 mmol/L (ref 135–145)

## 2023-02-12 LAB — CULTURE, BLOOD (ROUTINE X 2)

## 2023-02-12 MED ORDER — CEPHALEXIN 500 MG PO CAPS
500.0000 mg | ORAL_CAPSULE | Freq: Two times a day (BID) | ORAL | Status: AC
  Administered 2023-02-12: 500 mg via ORAL
  Filled 2023-02-12: qty 1

## 2023-02-12 MED ORDER — METOPROLOL TARTRATE 25 MG PO TABS
12.5000 mg | ORAL_TABLET | Freq: Four times a day (QID) | ORAL | Status: DC
  Administered 2023-02-12 – 2023-02-13 (×4): 12.5 mg via ORAL
  Filled 2023-02-12 (×4): qty 1

## 2023-02-12 MED ORDER — DIGOXIN 0.25 MG/ML IJ SOLN
0.1250 mg | Freq: Once | INTRAMUSCULAR | Status: AC
  Administered 2023-02-12: 0.125 mg via INTRAVENOUS
  Filled 2023-02-12: qty 2

## 2023-02-12 NOTE — Plan of Care (Signed)

## 2023-02-12 NOTE — Progress Notes (Addendum)
PROGRESS NOTE     HPI was taken from Dr. Irena Cords:  [name] is a 77 y.o. female with medical history significant for seizures, hypothyroidism, depression, cardiac history presented at [facility]. Patient has a history of falls as well .  Today she was transferring from recliner at baseline she is able to ambulate and stand without assistance but today she had pain. No Reports tingling fevers chills headaches blurred vision.  Patient does have some soreness in her right knee as well the right is a deformity of the right ankle Considered high risk for falls.  Hip x-ray is negative for any acute fracture or dislocation right knee that was x-rayed in the emergency room shows tricompartmental degenerative changes neutropenia. 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  Has gone down and blood pressure has improved.  Patient was therefore started on diltiazem drip and white 12 send blood pressure has  Improved.  EMS gave patient 500 cc normal saline.  Patient does not report any shortness of breath.  As per Dr. Ashok Pall: 6/6: Patient has responded to Cardizem infusion heart rate improved. However patient is hypoxic needing 2 L of oxygen via nasal cannula. Cardiology weaning Cardizem drip and initiating on p.o. Cardizem and metoprolol tartrate.     TELIA AMUNDSON  WUJ:[id number] DOB: 1946/01/28 DOA: 02/07/2023 PCP: Smiley Houseman, NP   Assessment & Plan:   Principal Problem:   Falls Active Problems:   Paroxysmal atrial fibrillation (HCC)   Hypotension   AKI (acute kidney injury) (HCC)   Acute cystitis without hematuria   History of gastrointestinal hemorrhage   GERD with stricture   Hypokalemia   Hypothyroidism   Dementia with behavioral disturbance (HCC)   DEPENDENCE, OPIOID, CONTINUOUS   Obesity (BMI 30.0-34.9)   Cerebrovascular accident (CVA) due to thrombosis of  precerebral artery (HCC)   Gait instability   Fall   Atrial fibrillation with RVR (HCC)  Assessment and Plan: Recurrent falls: PT/OT recs SNF. Pt and pt's son are agreeable to SNF. CT head shows no acute intracranial abnormalities  A.fib: w/ RVR. D/c IV heparin and started eliquis as per cardio. Continue on digoxin, low dose metop added today as continues with occasional rvr    UTI: urine cx growing aerococcus. Will stop keflex today as today is day 5   Thrombocytopenia: etiology unclear. Now mornalized.   Chronic diarrhea: etiology unclear. Possibly medication induced vs food induced vs microscopic colitis. Continue w/ supportive care. GI PCR panel ordered but no diarrhea since then   B/l LE edema:etiology unclear, ? venous insufficiency. Echo shows EF 60-65%, diastolic function is indeterminate.  [place] of b/l LE neg for DVTs.   Hypoxia?: patient is on [place] o2, unclear she needs this will speak with nursing to trial weaning. No cough, CT chest on 6/5 without acute findings.       DVT prophylaxis: eliquis  Code Status: full  Family Communication: no answer when son called today Disposition Plan: SNF, per TOC on 6/10 patient has bed offers  Level of care: Telemetry Cardiac  Status is: Inpatient Remains inpatient appropriate because: severity of illness    Consultants:  Cardio   Procedures:   Antimicrobials: keflex   Subjective: No specific complaints, tolerating diet  Objective: Vitals:   02/12/23 0243 02/12/23 0358 02/12/23 0906 02/12/23 1303  BP: 100/69 120/73 119/75 105/62  Pulse:  98 80 (!) 120 97  Resp: 19 18 (!) 22 10  Temp: 99 F (37.2 C) 98 F (36.7 C) 98.9 F (37.2 C) 97.9 F (36.6 C)  TempSrc:      SpO2: 93% 91% 91% 94%  Weight:      Height:        Intake/Output Summary (Last 24 hours) at 02/12/2023 1432 Last data filed at 02/12/2023 0102 Gross per 24 hour  Intake --  Output 700 ml  Net -700 ml   Filed Weights   02/07/23 1912  Weight: 102 kg     Examination:  General exam: Appears comfortable  Respiratory system: clear breath sounds b/l  Cardiovascular system: irregularly irregular  Gastrointestinal system: Abd is soft, NT, obese & hyperactive bowel sounds  Central nervous system: Alert and awake. Moves all extremities  Psychiatry: Judgement and insight appears at baseline. Appropriate mood and affect    Data Reviewed: I have personally reviewed following labs and imaging studies  CBC: Recent Labs  Lab 02/07/23 1933 02/08/23 0513 02/09/23 0434 02/10/23 0746 02/11/23 0533 02/12/23 0536  WBC 5.2 7.2 6.6 6.5 5.9 6.8  NEUTROABS 3.1  --  3.9  --   --   --   HGB 11.4* 10.9* 10.0* 10.1* 10.0* 11.5*  HCT 36.5 35.1* 31.9* 32.4* 32.2* 36.3  MCV 84.1 83.8 83.7 83.5 83.4 81.0  PLT 157 144* 130* 132* 142* 158   Basic Metabolic Panel: Recent Labs  Lab 02/07/23 2224 02/08/23 0513 02/09/23 0433 02/09/23 0434 02/10/23 0746 02/11/23 0533 02/12/23 0536  NA  --  140  --  140 137 140 135  K  --  3.3*  --  3.5 4.9 4.2 4.3  CL  --  106  --  105 103 103 100  CO2  --  24  --  27 29 30 28   GLUCOSE  --  95  --  104* 102* 88 93  BUN  --  16  --  11 10 12 14   CREATININE  --  0.79  --  0.94 0.85 0.80 0.82  CALCIUM  --  7.9*  --  8.0* 8.5* 8.2* 8.4*  MG 1.8  --   --  1.9  --   --   --   PHOS  --   --  3.7  --   --   --   --    GFR: Estimated Creatinine Clearance: 65.9 mL/min (by C-G formula based on SCr of 0.82 mg/dL). Liver Function Tests: Recent Labs  Lab 02/07/23 1933 02/08/23 0513 02/09/23 0434  AST 17 18 15   ALT 11 9 10   ALKPHOS 87 77 69  BILITOT 0.5 0.5 0.6  PROT 6.3* 5.8* 5.5*  ALBUMIN 3.3* 2.9* 2.8*   No results for input(s): "LIPASE", "AMYLASE" in the last 168 hours. No results for input(s): "AMMONIA" in the last 168 hours. Coagulation Profile: Recent Labs  Lab 02/07/23 1933  INR 1.2   Cardiac Enzymes: No results for input(s): "CKTOTAL", "CKMB", "CKMBINDEX", "TROPONINI" in the last 168 hours. BNP  (last 3 results) No results for input(s): "PROBNP" in the last 8760 hours. HbA1C: No results for input(s): "HGBA1C" in the last 72 hours. CBG: Recent Labs  Lab 02/09/23 0315  GLUCAP 94   Lipid Profile: No results for input(s): "CHOL", "HDL", "LDLCALC", "TRIG", "CHOLHDL", "LDLDIRECT" in the last 72 hours. Thyroid Function Tests: No results for input(s): "TSH", "T4TOTAL", "FREET4", "T3FREE", "THYROIDAB" in the last 72 hours.  Anemia Panel: No results for input(s): "VITAMINB12", "  FOLATE", "FERRITIN", "TIBC", "IRON", "RETICCTPCT" in the last 72 hours. Sepsis Labs: Recent Labs  Lab 02/07/23 1933  LATICACIDVEN 1.9    Recent Results (from the past 240 hour(s))  Blood Culture (routine x 2)     Status: None   Collection Time: 02/07/23  7:27 PM   Specimen: BLOOD  Result Value Ref Range Status   Specimen Description BLOOD BLOOD LEFT FOREARM  Final   Special Requests   Final    BOTTLES DRAWN AEROBIC AND ANAEROBIC Blood Culture adequate volume   Culture   Final    NO GROWTH 5 DAYS Performed at [facility], [address] [place], [place] [address]    Report Status 02/12/2023 FINAL  Final  Blood Culture (routine x 2)     Status: None   Collection Time: 02/07/23  7:32 PM   Specimen: BLOOD  Result Value Ref Range Status   Specimen Description BLOOD BLOOD RIGHT ARM  Final   Special Requests   Final    BOTTLES DRAWN AEROBIC AND ANAEROBIC Blood Culture results may not be optimal due to an excessive volume of blood received in culture bottles   Culture   Final    NO GROWTH 5 DAYS Performed at [facility], [address], [place], [place] [address]    Report Status 02/12/2023 FINAL  Final  Urine Culture     Status: Abnormal   Collection Time: 02/07/23  8:47 PM   Specimen: Urine, Random  Result Value Ref Range Status   Specimen Description   Final    URINE, RANDOM Performed at [facility], [address] [place], [place] [address]    Special  Requests   Final    NONE Reflexed from [id number] Performed at [facility], [address] [place], [place] [address]    Culture (A)  Final    >=100,000 COLONIES/mL AEROCOCCUS SPECIES Standardized susceptibility testing for this organism is not available. Performed at [facility], [address] [place], [place] [address]    Report Status 02/09/2023 FINAL  Final         Radiology Studies: No results found.      Scheduled Meds:  apixaban  5 mg Oral BID   cephALEXin  500 mg Oral Q12H   cholecalciferol  2,000 Units Oral Daily   digoxin  0.125 mg Oral Daily   divalproex  125 mg Oral BID   hyoscyamine  0.125 mg Oral BID   levothyroxine  100 mcg Oral Daily   lidocaine  1 patch Transdermal Daily   metoprolol tartrate  12.5 mg Oral Q6H   oxyCODONE  10 mg Oral Q8H   pantoprazole  40 mg Oral Daily   pregabalin  100 mg Oral BID   QUEtiapine  50 mg Oral QHS   ramelteon  8 mg Oral QHS   vortioxetine HBr  20 mg Oral Daily   Continuous Infusions:     LOS: 4 days    Time spent: 25 mins     Silvano Bilis, MD [facility]  If 7PM-7AM, please contact night-coverage [email or web address] 02/12/2023, 2:32 PM

## 2023-02-12 NOTE — Progress Notes (Addendum)
       CROSS COVER NOTE  NAME: [name] MRN: [id number] DOB : 08/19/1946    Concern as stated by nurse / staff   Hello, Patient hr Is fluctuating from 58 to 170 BP 132/92 on Telemetry monitor AFIB RVR. Patient alert and oriented, from SNF, C/O pain PRN morphine, and scheduled oxy given, Patient has history of Migraines awaiting Imitrex from pharmacy, for headache rated at 8/10. Patient was in AFIB RVR on admission Diltiazem drip started patient transitioned to PO Diltiazem and metoprolol discontinued due to low BP. No PRN available for HR.      Pertinent findings on chart review: Per last progress note: A.fib: w/ RVR. D/c IV heparin and started eliquis as per cardio. Continue on digoxin and continue to hold metoprolol, diltiazem as per cardio     02/12/2023    2:43 AM 02/12/2023    1:39 AM 02/12/2023    1:16 AM  Vitals with BMI  Systolic 100 100 99  Diastolic 69 76 66  Pulse 98 71 86   According to nurse, heart rate mostly in the 130s Med history reviewed and digoxin was started on 6/8.  Total digoxin received since start on 6/8 is 0.625mg    Assessment and  Interventions   Assessment: A-fib with RVR Headache  Plan: Will give a one-time digoxin IV of 0.125 and continue with oral 0.125mg  bid Consider cardiology consult for consideration of amiodarone if no contraindications.  Will defer to primary attending For headache, continue current meds with Imitrex

## 2023-02-12 NOTE — Care Management Important Message (Signed)
Important Message  Patient Details  Name: [name] MRN: [id number] Date of Birth: 1946/09/04   Medicare Important Message Given:  Yes     Johnell Comings 02/12/2023, 12:12 PM

## 2023-02-12 NOTE — Progress Notes (Signed)
Physical Therapy Treatment Patient Details Name: [name] MRN: [id number] DOB: 28-Dec-1945 Today's Date: 02/12/2023   History of Present Illness Pt is a 77 y.o. female with history of overt obesity, seizures, hypothyroidism, mechanical falls, iron deficiency anemia, anxiety/panic attacks. Pt admitted to ED 6/5 after a fall at [facility], no significant trauma to body reported.    PT Comments    Patient continues to be limited by decreased activity tolerance and weakness. Required min guard for sit to stand and minA to transfer to recliner with ability to take small steps over towards the recliner. VSS on 2L, however 2/4 DOE with minimal activity. Encouraged patient to sit up for meals to improve upright tolerance and breathing. Discharge plan remains appropriate.    Recommendations for follow up therapy are one component of a multi-disciplinary discharge planning process, led by the attending physician.  Recommendations may be updated based on patient status, additional functional criteria and insurance authorization.  Follow Up Recommendations  Can patient physically be transported by private vehicle: No    Assistance Recommended at Discharge Intermittent Supervision/Assistance  Patient can return home with the following A lot of help with walking and/or transfers;A little help with bathing/dressing/bathroom;Help with stairs or ramp for entrance;Assist for transportation   Equipment Recommendations  Other (comment) (TBD at next venue of care)    Recommendations for Other Services       Precautions / Restrictions Precautions Precautions: Fall Restrictions Weight Bearing Restrictions: No     Mobility  Bed Mobility Overal bed mobility: Needs Assistance Bed Mobility: Supine to Sit     Supine to sit: Supervision          Transfers Overall transfer level: Needs assistance Equipment used: Rolling [name] (2 wheels) Transfers: Sit to/from Stand Sit to Stand: Min  guard           General transfer comment: min guard for safety. increased time and effort to complete    Ambulation/Gait Ambulation/Gait assistance: Min assist Gait Distance (Feet): 4 Feet Assistive device: Rolling [name] (2 wheels) Gait Pattern/deviations: Step-to pattern, Decreased step length - right, Decreased step length - left, Shuffle Gait velocity: decreased     General Gait Details: assist for balance. Took small steps towards recliner with RW with no LOB noted   Stairs             Wheelchair Mobility    Modified Rankin (Stroke Patients Only)       Balance Overall balance assessment: Needs assistance Sitting-balance support: No upper extremity supported, Feet unsupported Sitting balance-Leahy Scale: Good     Standing balance support: During functional activity, Bilateral upper extremity supported, Reliant on assistive device for balance Standing balance-Leahy Scale: Poor                              Cognition Arousal/Alertness: Awake/alert Behavior During Therapy: WFL for tasks assessed/performed Overall Cognitive Status: Within Functional Limits for tasks assessed                                          Exercises      General Comments        Pertinent Vitals/Pain Pain Assessment Pain Assessment: Faces Faces Pain Scale: No hurt Pain Intervention(s): Monitored during session    Home Living  Prior Function            PT Goals (current goals can now be found in the care plan section) Acute Rehab PT Goals PT Goal Formulation: With patient Time For Goal Achievement: 02/23/23 Potential to Achieve Goals: Fair Progress towards PT goals: Progressing toward goals    Frequency    Min 3X/week      PT Plan Current plan remains appropriate    Co-evaluation              AM-PAC PT "6 Clicks" Mobility   Outcome Measure  Help needed turning from your back to your  side while in a flat bed without using bedrails?: A Little Help needed moving from lying on your back to sitting on the side of a flat bed without using bedrails?: A Little Help needed moving to and from a bed to a chair (including a wheelchair)?: A Little Help needed standing up from a chair using your arms (e.g., wheelchair or bedside chair)?: A Little Help needed to walk in hospital room?: A Lot Help needed climbing 3-5 steps with a railing? : Total 6 Click Score: 15    End of Session Equipment Utilized During Treatment: Gait belt;Oxygen Activity Tolerance: Patient tolerated treatment well Patient left: in chair;with call bell/phone within reach;with chair alarm set Nurse Communication: Mobility status PT Visit Diagnosis: Unsteadiness on feet (R26.81);Muscle weakness (generalized) (M62.81);History of falling (Z91.81)     Time: 1610-9604 PT Time Calculation (min) (ACUTE ONLY): 14 min  Charges:  $Therapeutic Activity: 8-22 mins                     Maylon Peppers, PT, DPT Physical Therapist - [facility]    Duriel Deery A Kevin Mario 02/12/2023, 1:13 PM

## 2023-02-12 NOTE — Progress Notes (Signed)
Progress Note  Patient Name: [name] Date of Encounter: 02/12/2023  Primary Cardiologist: Mariah Milling  Subjective   Remains in Afib with RVR with ventricular rates in the low 100s to 140s bpm. Given additional IV digoxin 0.125 mg overnight. No chest pain, dyspnea, or palpitations.   Inpatient Medications    Scheduled Meds:  apixaban  5 mg Oral BID   cephALEXin  500 mg Oral Q12H   cholecalciferol  2,000 Units Oral Daily   digoxin  0.125 mg Oral Daily   divalproex  125 mg Oral BID   hyoscyamine  0.125 mg Oral BID   levothyroxine  100 mcg Oral Daily   lidocaine  1 patch Transdermal Daily   oxyCODONE  10 mg Oral Q8H   pantoprazole  40 mg Oral Daily   pregabalin  100 mg Oral BID   QUEtiapine  50 mg Oral QHS   ramelteon  8 mg Oral QHS   vortioxetine HBr  20 mg Oral Daily   Continuous Infusions:  PRN Meds: acetaminophen **OR** acetaminophen, HYDROcodone-acetaminophen, morphine injection, SUMAtriptan, traZODone   Vital Signs    Vitals:   02/12/23 0139 02/12/23 0243 02/12/23 0358 02/12/23 0906  BP: 100/76 100/69 120/73 119/75  Pulse: 71 98 80 (!) 120  Resp:  19 18 (!) 22  Temp:  99 F (37.2 C) 98 F (36.7 C) 98.9 F (37.2 C)  TempSrc:      SpO2: 92% 93% 91% 91%  Weight:      Height:        Intake/Output Summary (Last 24 hours) at 02/12/2023 0909 Last data filed at 02/12/2023 1610 Gross per 24 hour  Intake 100 ml  Output 1525 ml  Net -1425 ml   Filed Weights   02/07/23 1912  Weight: 102 kg    Telemetry    Afib with RVR with ventricular rates in the low 100s to 140s bpm - Personally Reviewed  ECG    No new tracings - Personally Reviewed  Physical Exam   GEN: No acute distress.   Neck: No JVD. Cardiac: Tachycardic, IRIR, no murmurs, rubs, or gallops.  Respiratory: Clear to auscultation bilaterally.  GI: Soft, nontender, non-distended.   MS: No edema; No deformity. Neuro:  Alert and oriented x 3; Nonfocal.  Psych: Normal affect.  Labs     Chemistry Recent Labs  Lab 02/07/23 1933 02/08/23 0513 02/09/23 0434 02/10/23 0746 02/11/23 0533 02/12/23 0536  NA 142 140 140 137 140 135  K 3.4* 3.3* 3.5 4.9 4.2 4.3  CL 105 106 105 103 103 100  CO2 29 24 27 29 30 28   GLUCOSE 52* 95 104* 102* 88 93  BUN 19 16 11 10 12 14   CREATININE 1.07* 0.79 0.94 0.85 0.80 0.82  CALCIUM 7.8* 7.9* 8.0* 8.5* 8.2* 8.4*  PROT 6.3* 5.8* 5.5*  --   --   --   ALBUMIN 3.3* 2.9* 2.8*  --   --   --   AST 17 18 15   --   --   --   ALT 11 9 10   --   --   --   ALKPHOS 87 77 69  --   --   --   BILITOT 0.5 0.5 0.6  --   --   --   GFRNONAA 53* >60 >60 >60 >60 >60  ANIONGAP 8 10 8 5 7 7      Hematology Recent Labs  Lab 02/10/23 0746 02/11/23 0533 02/12/23 0536  WBC 6.5 5.9 6.8  RBC 3.88 3.86* 4.48  HGB 10.1* 10.0* 11.5*  HCT 32.4* 32.2* 36.3  MCV 83.5 83.4 81.0  MCH 26.0 25.9* 25.7*  MCHC 31.2 31.1 31.7  RDW 17.0* 16.7* 16.1*  PLT 132* 142* 158    Cardiac EnzymesNo results for input(s): "TROPONINI" in the last 168 hours. No results for input(s): "TROPIPOC" in the last 168 hours.   BNP Recent Labs  Lab 02/07/23 1933  BNP 804.3*     DDimer No results for input(s): "DDIMER" in the last 168 hours.   Radiology    No results found.  Cardiac Studies   2D echo 02/08/2023: 1. Left ventricular ejection fraction, by estimation, is 60 to 65%. The  left ventricle has normal function. The left ventricle has no regional  wall motion abnormalities. There is mild left ventricular hypertrophy.  Left ventricular diastolic parameters  are indeterminate.   2. Right ventricular systolic function is normal. The right ventricular  size is normal. There is mildly elevated pulmonary artery systolic  pressure.   3. Left atrial size was moderately dilated.   4. The mitral valve is normal in structure. Mild to moderate mitral valve  regurgitation. No evidence of mitral stenosis.   5. The aortic valve has an indeterminant number of cusps. Aortic valve   regurgitation is mild to moderate. No aortic stenosis is present.   6. The inferior vena cava is normal in size with greater than 50%  respiratory variability, suggesting right atrial pressure of 3 mmHg.   7. Agitated saline contrast bubble study was negative, with no evidence  of any interatrial shunt.   Patient Profile     77 y.o. female with history of seizures, hypothyroidism, mechanical falls, iron deficiency anemia, anxiety/panic attacks, GERD, migraines, and obeisty admitted with recurrent falls and who we are seeing for Afib with RVR of uncertain duration.   Assessment & Plan    1. Afib with RVR: -Remains in Afib with RVR with ventricular rates in the low 100s to 140s bpm -Previously on diltiazem gtt with transition to oral diltiazem and metoprlol, though these were held secondary to hypotension -Has been started on digoxin as of 6/8 at 0.25 mg x 2 followed by 0.125 mg daily thereafter -She was given an additional dose of IV digoxin 0.125 mg overnight -Order placed to check a digoxin level in the morning -Amiodarone is not ideal given the duration of her Afib is unclear and she was not on OAC prior to admission  -Trial of Lopressor 12.5 mg q 6 hours with hold parameters for systolic BP < 100 mmHg -If she continues to have difficult to control ventricular rates, would need to consider TEE-guided DCCV prior to discharge  -CHADS2VASc at least 3 -Has been transitioned from heparin gtt to Eliquis -Consider outpatient EP evaluation for Watchman given history of falls -Continue tele monitoring  -Potassium at goal, TSH and magnesium normal  2. Hypotension: -Blood pressure stable to soft at times -Trial of Lopressor as outlined above  3. Iron deficiency anemia: -Stable  4. History of falls: -PT/OT  5. UTI/diarrhea: -Per IM      For questions or updates, please contact [facility] Please consult [email or web address] for contact info under Cardiology/STEMI.    Signed, Eula Listen, PA-C [facility] Pager: [phone] 02/12/2023, 9:09 AM

## 2023-02-12 NOTE — Progress Notes (Signed)
I called the patient's son [name] and updated him about the patient's condition and our recommendation to proceed with TEE guided cardioversion.  I explained the procedure in details as well as risk and benefits.  He is agreeable.

## 2023-02-12 NOTE — TOC Progression Note (Signed)
Transition of Care [facility]) - Progression Note    Patient Details  Name: [name] MRN: [id number] Date of Birth: 1946/01/14  [facility] CM/SW Contact  Truddie Hidden, RN Phone Number: 02/12/2023, 3:42 PM  Clinical Narrative:    Attempt to give bed offers. Will reattempt at a later time.    Expected Discharge Plan: Skilled Nursing Facility Barriers to Discharge: Continued Medical Work up  Expected Discharge Plan and Services     Post Acute Care Choice: Skilled Nursing Facility Living arrangements for the past 2 months: Assisted Living Facility                                       Social Determinants of Health (SDOH) Interventions SDOH Screenings   Food Insecurity: No Food Insecurity (02/09/2023)  Housing: Low Risk  (02/09/2023)  Transportation Needs: No Transportation Needs (02/09/2023)  Utilities: Not At Risk (02/09/2023)  Depression (PHQ2-9): High Risk (03/31/2022)  Tobacco Use: Low Risk  (02/09/2023)    Readmission Risk Interventions     No data to display

## 2023-02-12 NOTE — Progress Notes (Signed)
Occupational Therapy Treatment Patient Details Name: [name] MRN: [id number] DOB: [birth year] Today's Date: 02/12/2023   History of present illness Pt is a 77 y.o. female with history of overt obesity, seizures, hypothyroidism, mechanical falls, iron deficiency anemia, anxiety/panic attacks. Pt admitted to ED 6/5 after a fall at [facility], no significant trauma to body reported.   OT comments  Ms Lichte was seen for OT treatment on this date. Upon arrival to room pt reclined in bed, agreeable to tx. Pt requires CGA + RW sit<>stand and steps bed>chair ~4 ft. MIN A to adjust B socks seated EOB. SpO2 94% on 2L [place], defers further activity requesting to finish lunch tray. Pt making good progress toward goals, will continue to follow POC. Discharge recommendation remains appropriate.     Recommendations for follow up therapy are one component of a multi-disciplinary discharge planning process, led by the attending physician.  Recommendations may be updated based on patient status, additional functional criteria and insurance authorization.    Assistance Recommended at Discharge Intermittent Supervision/Assistance  Patient can return home with the following  A lot of help with walking and/or transfers;A lot of help with bathing/dressing/bathroom   Equipment Recommendations  Other (comment) (defer)    Recommendations for Other Services      Precautions / Restrictions Precautions Precautions: Fall Restrictions Weight Bearing Restrictions: No       Mobility Bed Mobility Overal bed mobility: Needs Assistance Bed Mobility: Supine to Sit     Supine to sit: Supervision     General bed mobility comments: increased time    Transfers Overall transfer level: Needs assistance Equipment used: Rolling walker (2 wheels) Transfers: Sit to/from Stand Sit to Stand: Min guard                 Balance Overall balance assessment: Needs assistance Sitting-balance support: No upper  extremity supported, Feet unsupported Sitting balance-Leahy Scale: Good     Standing balance support: Reliant on assistive device for balance Standing balance-Leahy Scale: Poor                             ADL either performed or assessed with clinical judgement   ADL Overall ADL's : Needs assistance/impaired                                       General ADL Comments: SETUP self-feeding seated in chair. MIN A to adjust B socks seated EOB. CGA + RW simulated BSC t/f      Cognition Arousal/Alertness: Awake/alert Behavior During Therapy: WFL for tasks assessed/performed Overall Cognitive Status: Within Functional Limits for tasks assessed                                                     Pertinent Vitals/ Pain       Pain Assessment Pain Assessment: No/denies pain   Frequency  Min 2X/week        Progress Toward Goals  OT Goals(current goals can now be found in the care plan section)  Progress towards OT goals: Progressing toward goals  Acute Rehab OT Goals Patient Stated Goal: get stronger OT Goal Formulation: With patient Time For Goal Achievement:  Potential to  Achieve Goals: Fair ADL Goals Pt Will Perform Grooming: with supervision;standing Pt Will Perform Lower Body Dressing: with supervision;sit to/from stand Pt Will Transfer to Toilet: with supervision;ambulating Pt Will Perform Toileting - Clothing Manipulation and hygiene: with supervision;sit to/from stand  Plan Discharge plan remains appropriate;Frequency remains appropriate    Co-evaluation                 AM-PAC OT "6 Clicks" Daily Activity     Outcome Measure   Help from another person eating meals?: None Help from another person taking care of personal grooming?: A Little Help from another person toileting, which includes using toliet, bedpan, or urinal?: A Lot Help from another person bathing (including washing, rinsing,  drying)?: A Lot Help from another person to put on and taking off regular upper body clothing?: A Little Help from another person to put on and taking off regular lower body clothing?: A Lot 6 Click Score: 16    End of Session Equipment Utilized During Treatment: Rolling walker (2 wheels)  OT Visit Diagnosis: Unsteadiness on feet (R26.81);Muscle weakness (generalized) (M62.81);Repeated falls (R29.6);History of falling (Z91.81)   Activity Tolerance Patient tolerated treatment well   Patient Left in chair;with call bell/phone within reach;with chair alarm set   Nurse Communication          Time: 1610-9604 OT Time Calculation (min): 10 min  Charges: OT General Charges $OT Visit: 1 Visit OT Treatments $Self Care/Home Management : 8-22 mins  Kathie Dike, M.S. OTR/L  02/12/23, 2:58 PM  ascom [phone]

## 2023-02-13 ENCOUNTER — Inpatient Hospital Stay: Payer: Medicare Other | Admitting: Certified Registered"

## 2023-02-13 ENCOUNTER — Encounter: Admission: EM | Disposition: E | Payer: Self-pay | Source: Home / Self Care | Attending: Internal Medicine

## 2023-02-13 ENCOUNTER — Inpatient Hospital Stay (HOSPITAL_COMMUNITY)
Admit: 2023-02-13 | Discharge: 2023-02-13 | Disposition: A | Discharge status: Home / Self Care | Payer: Medicare Other | Attending: Physician Assistant | Admitting: Physician Assistant

## 2023-02-13 DIAGNOSIS — I340 Nonrheumatic mitral (valve) insufficiency: Secondary | ICD-10-CM

## 2023-02-13 DIAGNOSIS — I4891 Unspecified atrial fibrillation: Secondary | ICD-10-CM

## 2023-02-13 DIAGNOSIS — W19XXXA Unspecified fall, initial encounter: Secondary | ICD-10-CM | POA: Diagnosis not present

## 2023-02-13 DIAGNOSIS — I509 Heart failure, unspecified: Secondary | ICD-10-CM | POA: Diagnosis not present

## 2023-02-13 DIAGNOSIS — R0602 Shortness of breath: Secondary | ICD-10-CM | POA: Diagnosis not present

## 2023-02-13 DIAGNOSIS — R2681 Unsteadiness on feet: Secondary | ICD-10-CM | POA: Diagnosis not present

## 2023-02-13 DIAGNOSIS — E669 Obesity, unspecified: Secondary | ICD-10-CM | POA: Diagnosis not present

## 2023-02-13 DIAGNOSIS — I4819 Other persistent atrial fibrillation: Secondary | ICD-10-CM | POA: Diagnosis not present

## 2023-02-13 DIAGNOSIS — I351 Nonrheumatic aortic (valve) insufficiency: Secondary | ICD-10-CM | POA: Diagnosis not present

## 2023-02-13 DIAGNOSIS — I959 Hypotension, unspecified: Secondary | ICD-10-CM | POA: Diagnosis not present

## 2023-02-13 DIAGNOSIS — N3000 Acute cystitis without hematuria: Secondary | ICD-10-CM | POA: Diagnosis not present

## 2023-02-13 HISTORY — PX: TEE WITHOUT CARDIOVERSION: SHX5443

## 2023-02-13 HISTORY — PX: CARDIOVERSION: SHX1299

## 2023-02-13 LAB — BASIC METABOLIC PANEL
Anion gap: 9 (ref 5–15)
BUN: 13 mg/dL (ref 8–23)
CO2: 26 mmol/L (ref 22–32)
Calcium: 7.9 mg/dL — ABNORMAL LOW (ref 8.9–10.3)
Chloride: 99 mmol/L (ref 98–111)
Creatinine, Ser: 0.7 mg/dL (ref 0.44–1.00)
GFR, Estimated: >60 mL/min (ref >60)
Glucose, Bld: 103 mg/dL — ABNORMAL HIGH (ref 70–99)
Potassium: 4 mmol/L (ref 3.5–5.1)
Sodium: 134 mmol/L — ABNORMAL LOW (ref 135–145)

## 2023-02-13 LAB — ECHO TEE

## 2023-02-13 LAB — PROTIME-INR
INR: 1.2 (ref 0.8–1.2)
Prothrombin Time: 15.7 seconds — ABNORMAL HIGH (ref 11.4–15.2)

## 2023-02-13 LAB — DIGOXIN LEVEL: Digoxin Level: 0.6 ng/mL — ABNORMAL LOW (ref 0.8–2.0)

## 2023-02-13 SURGERY — ECHOCARDIOGRAM, TRANSESOPHAGEAL
Anesthesia: General

## 2023-02-13 SURGERY — CARDIOVERSION
Anesthesia: General

## 2023-02-13 MED ORDER — FUROSEMIDE 20 MG PO TABS
20.0000 mg | ORAL_TABLET | Freq: Every day | ORAL | Status: DC
  Administered 2023-02-13 – 2023-02-17 (×5): 20 mg via ORAL
  Filled 2023-02-13 (×5): qty 1

## 2023-02-13 MED ORDER — SODIUM CHLORIDE 0.9 % IV SOLN: INTRAVENOUS | Status: DC

## 2023-02-13 MED ORDER — PROPOFOL 10 MG/ML IV BOLUS
INTRAVENOUS | Status: DC | PRN
  Administered 2023-02-13 (×3): 30 mg via INTRAVENOUS

## 2023-02-13 MED ORDER — ORAL CARE MOUTH RINSE: 15.0000 mL | OROMUCOSAL | Status: DC | PRN

## 2023-02-13 MED ORDER — LIDOCAINE VISCOUS HCL 2 % MT SOLN
OROMUCOSAL | Status: AC
  Filled 2023-02-13: qty 15

## 2023-02-13 MED ORDER — PROPOFOL 10 MG/ML IV BOLUS
INTRAVENOUS | Status: AC
  Filled 2023-02-13: qty 40

## 2023-02-13 MED ORDER — METOPROLOL TARTRATE 25 MG PO TABS
25.0000 mg | ORAL_TABLET | Freq: Two times a day (BID) | ORAL | Status: DC
  Filled 2023-02-13: qty 1

## 2023-02-13 MED ORDER — BUTAMBEN-TETRACAINE-BENZOCAINE 2-2-14 % EX AERO
INHALATION_SPRAY | CUTANEOUS | Status: AC
  Filled 2023-02-13: qty 5

## 2023-02-13 NOTE — Plan of Care (Signed)

## 2023-02-13 NOTE — Anesthesia Postprocedure Evaluation (Signed)
Anesthesia Post Note  Patient: [name]  Procedure(s) Performed: CARDIOVERSION TRANSESOPHAGEAL ECHOCARDIOGRAM (TEE)  Patient location during evaluation: Specials Recovery Anesthesia Type: General Level of consciousness: awake and alert Pain management: pain level controlled Vital Signs Assessment: post-procedure vital signs reviewed and stable Respiratory status: spontaneous breathing, nonlabored ventilation, respiratory function stable and patient connected to nasal cannula oxygen Cardiovascular status: blood pressure returned to baseline and stable Postop Assessment: no apparent nausea or vomiting Anesthetic complications: no   No notable events documented.   Last Vitals:  Vitals:   02/13/23 1312 02/13/23 1315  BP: (!) 107/57 (!) 91/51  Pulse:  (!) 51  Resp:  18  Temp:    SpO2:  96%    Last Pain:  Vitals:   02/13/23 1315  TempSrc:   PainSc: 0-No pain                 Louie Boston

## 2023-02-13 NOTE — Anesthesia Preprocedure Evaluation (Signed)
Anesthesia Evaluation  Patient identified by MRN, date of birth, ID band Patient awake    Reviewed: Allergy & Precautions, NPO status , Patient's Chart, lab work & pertinent test results  History of Anesthesia Complications Negative for: history of anesthetic complications  Airway Mallampati: II  TM Distance: >3 FB Neck ROM: full    Dental no notable dental hx. (+) Chipped   Pulmonary neg pulmonary ROS   Pulmonary exam normal        Cardiovascular +CHF  + dysrhythmias Atrial Fibrillation  Rhythm:Irregular Rate:Tachycardia     Neuro/Psych  Headaches, Seizures -,  PSYCHIATRIC DISORDERS Anxiety Depression   Dementia  Neuromuscular disease CVA    GI/Hepatic Neg liver ROS,GERD  Controlled,,  Endo/Other  Hypothyroidism    Renal/GU negative Renal ROS  negative genitourinary   Musculoskeletal   Abdominal   Peds  Hematology  (+) Blood dyscrasia, anemia   Anesthesia Other Findings Past Medical History: No date: Anemia     Comment:  took Fe- orally 2 yrs. ago  No date: Anxiety     Comment:  panic attacks  No date: Arthritis     Comment:  shoulders, knees No date: Cancer (HCC)     Comment:  skin- preCA No date: Depression No date: GERD (gastroesophageal reflux disease)     Comment:  uses Protonix as needed  No date: Headache(784.0) No date: Humerus fracture     Comment:  Right  02/27/2013: Hyposmolality and/or hyponatremia No date: Hypothyroidism No date: IBS (irritable bowel syndrome) 09/05/2018: Injury of left foot 04/27/2014: Injury of right hand No date: Memory loss No date: Mental disorder No date: Migraine 02/26/2013: Preoperative examination No date: Seizures (HCC)     Comment:  caused by depression , seritonin seizure - effected               short term memory   No date: Shortness of breath  Past Surgical History: No date: ABDOMINAL HYSTERECTOMY No date: APPENDECTOMY 1990's: BACK SURGERY 1972:  BREAST SURGERY     Comment:  augmentation  No date: CHOLECYSTECTOMY 1994: GASTRECTOMY     Comment:  Due to ulcer, required multiple revisions.  No date: HERNIA REPAIR 2004: ORIF ANKLE FRACTURE; Right No date: REPLACEMENT TOTAL KNEE 05/16/2013: REVERSE SHOULDER ARTHROPLASTY; Right     Comment:  Procedure: RIGHT HEMI ARTHROPLASTY  VERSES  REVERSE               TOTAL SHOULDER ARTHROPLASTY ;  Surgeon: Verlee Rossetti,               MD;  Location: [facility] OR;  Service: Orthopedics;  Laterality:              Right; 2005: SPINE SURGERY     Comment:  Lumbar spinal stenosis  No date: STOMACH SURGERY     Comment:  1/3 resection for obstruction No date: TONSILLECTOMY No date: TUBAL LIGATION  BMI    Body Mass Index: 37.42 kg/m      Reproductive/Obstetrics negative OB ROS                             Anesthesia Physical Anesthesia Plan  ASA: 4  Anesthesia Plan: General   Post-op Pain Management: Minimal or no pain anticipated   Induction: Intravenous  PONV Risk Score and Plan: Propofol infusion and TIVA  Airway Management Planned: Natural Airway and Nasal Cannula  Additional Equipment:   Intra-op Plan:   Post-operative Plan:  Informed Consent: I have reviewed the patients History and Physical, chart, labs and discussed the procedure including the risks, benefits and alternatives for the proposed anesthesia with the patient or authorized representative who has indicated his/her understanding and acceptance.     Dental Advisory Given  Plan Discussed with: Anesthesiologist, CRNA and Surgeon  Anesthesia Plan Comments: (Patient consented for risks of anesthesia including but not limited to:  - adverse reactions to medications - risk of airway placement if required - damage to eyes, teeth, lips or other oral mucosa - nerve damage due to positioning  - sore throat or hoarseness - Damage to heart, brain, nerves, lungs, other parts of body or loss of  life  Patient voiced understanding.)       Anesthesia Quick Evaluation

## 2023-02-13 NOTE — Progress Notes (Signed)
Progress Note  Patient Name: [name] Date of Encounter: 02/13/2023  Primary Cardiologist: Mariah Milling  Subjective   She underwent successful TEE guided cardioversion today.  She is in sinus rhythm with a heart rate of 58 bpm.  She feels better.  She has mild dyspnea and TEE was suggestive of mild to moderate pulmonary hypertension.  In addition, her BNP was mildly elevated and chest x-ray showed pulmonary vascular congestion.  Inpatient Medications    Scheduled Meds:  apixaban  5 mg Oral BID   cholecalciferol  2,000 Units Oral Daily   digoxin  0.125 mg Oral Daily   divalproex  125 mg Oral BID   furosemide  20 mg Oral Daily   hyoscyamine  0.125 mg Oral BID   levothyroxine  100 mcg Oral Daily   lidocaine  1 patch Transdermal Daily   metoprolol tartrate  25 mg Oral BID   oxyCODONE  10 mg Oral Q8H   pantoprazole  40 mg Oral Daily   pregabalin  100 mg Oral BID   QUEtiapine  50 mg Oral QHS   ramelteon  8 mg Oral QHS   vortioxetine HBr  20 mg Oral Daily   Continuous Infusions:  PRN Meds: acetaminophen **OR** acetaminophen, SUMAtriptan, traZODone   Vital Signs    Vitals:   02/13/23 1312 02/13/23 1315 02/13/23 1330 02/13/23 1345  BP: (!) 107/57 (!) 91/51 101/63 106/60  Pulse:  (!) 51 (!) 52 (!) 56  Resp:  18 17 15   Temp:      TempSrc:      SpO2:  96% 95% 96%  Weight:      Height:        Intake/Output Summary (Last 24 hours) at 02/13/2023 1409 Last data filed at 02/13/2023 1127 Gross per 24 hour  Intake 120 ml  Output 2125 ml  Net -2005 ml    Filed Weights   02/07/23 1912  Weight: 102 kg    Telemetry    Afib with RVR with ventricular rates in the low 100s to 140s bpm.  Converted to normal sinus rhythm after cardioversion today.- Personally Reviewed  ECG    No new tracings - Personally Reviewed  Physical Exam   GEN: No acute distress.   Neck: No JVD. Cardiac: Regular rate and rhythm, no murmurs, rubs, or gallops.  Respiratory: Clear to auscultation  bilaterally.  GI: Soft, nontender, non-distended.   MS: No edema; No deformity. Neuro:  Alert and oriented x 3; Nonfocal.  Psych: Normal affect.  Labs    Chemistry Recent Labs  Lab 02/07/23 1933 02/08/23 0513 02/09/23 0434 02/10/23 0746 02/11/23 0533 02/12/23 0536 02/13/23 0622  NA 142 140 140   < > 140 135 134*  K 3.4* 3.3* 3.5   < > 4.2 4.3 4.0  CL 105 106 105   < > 103 100 99  CO2 29 24 27    < > 30 28 26   GLUCOSE 52* 95 104*   < > 88 93 103*  BUN 19 16 11    < > 12 14 13   CREATININE 1.07* 0.79 0.94   < > 0.80 0.82 0.70  CALCIUM 7.8* 7.9* 8.0*   < > 8.2* 8.4* 7.9*  PROT 6.3* 5.8* 5.5*  --   --   --   --   ALBUMIN 3.3* 2.9* 2.8*  --   --   --   --   AST 17 18 15   --   --   --   --  ALT 11 9 10   --   --   --   --   ALKPHOS 87 77 69  --   --   --   --   BILITOT 0.5 0.5 0.6  --   --   --   --   GFRNONAA 53* >60 >60   < > >60 >60 >60  ANIONGAP 8 10 8    < > 7 7 9    < > = values in this interval not displayed.      Hematology Recent Labs  Lab 02/10/23 0746 02/11/23 0533 02/12/23 0536  WBC 6.5 5.9 6.8  RBC 3.88 3.86* 4.48  HGB 10.1* 10.0* 11.5*  HCT 32.4* 32.2* 36.3  MCV 83.5 83.4 81.0  MCH 26.0 25.9* 25.7*  MCHC 31.2 31.1 31.7  RDW 17.0* 16.7* 16.1*  PLT 132* 142* 158     Cardiac EnzymesNo results for input(s): "TROPONINI" in the last 168 hours. No results for input(s): "TROPIPOC" in the last 168 hours.   BNP Recent Labs  Lab 02/07/23 1933  BNP 804.3*      DDimer No results for input(s): "DDIMER" in the last 168 hours.   Radiology    ECHO TEE  Result Date: 02/13/2023    TRANSESOPHOGEAL ECHO REPORT   Patient Name:   [name] Date of Exam: 02/13/2023 Medical Rec #:  [id number]     Height:       65.0 in Accession #:    [id number]    Weight:       224.9 lb Date of Birth:  03-18-1946     BSA:          2.079 m Patient Age:    77 years      BP:           115/80 mmHg Patient Gender: F             HR:           120 bpm. Exam Location:  [facility] Procedure:  Transesophageal Echo Indications:     atrial fibrillation with RVR, SOB  History:         Patient has prior history of Echocardiogram examinations.                  Arrythmias:Atrial Fibrillation; Signs/Symptoms:Shortness of                  Breath.  Sonographer:     Cristela Blue Referring Phys:  [id number] Sondra Barges Diagnosing Phys: Julien Nordmann MD PROCEDURE: After discussion of the risks and benefits of a TEE, an informed consent was obtained from the patient. TEE procedure time was 30 minutes. The transesophogeal probe was passed without difficulty through the esophogus of the patient. Imaged were obtained with the patient in a left lateral decubitus position. Local oropharyngeal anesthetic was provided with Cetacaine and viscous lidocaine. Sedation performed by different physician. Image quality was good. The patient's vital signs; including  heart rate, blood pressure, and oxygen saturation; remained stable throughout the procedure. The patient developed no complications during the procedure. A successful direct current cardioversion was performed at 150 joules with 1 attempt.  IMPRESSIONS  1. Left ventricular ejection fraction, by estimation, is 50 to 55%. The left ventricle has low normal function. The left ventricle has no regional wall motion abnormalities.  2. Right ventricular systolic function is low normal. The right ventricular size is normal.  3. Left atrial size was moderately dilated. No left atrial/left atrial appendage  thrombus was detected.  4. Right atrial size was moderately dilated.  5. The mitral valve is normal in structure. Moderate mitral valve regurgitation. No evidence of mitral stenosis.  6. The aortic valve is tricuspid. Aortic valve regurgitation is mild to moderate. No aortic stenosis is present.  7. The inferior vena cava is normal in size with greater than 50% respiratory variability, suggesting right atrial pressure of 3 mmHg.  8. Agitated saline contrast bubble study was positive  with shunting observed within 3-6 cardiac cycles suggestive of interatrial shunt. There is a small patent foramen ovale with predominantly right to left shunting across the atrial septum. Conclusion(s)/Recommendation(s): Normal biventricular function without evidence of hemodynamically significant valvular heart disease. FINDINGS  Left Ventricle: Left ventricular ejection fraction, by estimation, is 50 to 55%. The left ventricle has low normal function. The left ventricle has no regional wall motion abnormalities. The left ventricular internal cavity size was normal in size. There is no left ventricular hypertrophy. Right Ventricle: The right ventricular size is normal. No increase in right ventricular wall thickness. Right ventricular systolic function is low normal. Left Atrium: Left atrial size was moderately dilated. No left atrial/left atrial appendage thrombus was detected. Right Atrium: Right atrial size was moderately dilated. Pericardium: There is no evidence of pericardial effusion. Mitral Valve: The mitral valve is normal in structure. Moderate mitral valve regurgitation. No evidence of mitral valve stenosis. Tricuspid Valve: The tricuspid valve is normal in structure. Tricuspid valve regurgitation is mild . No evidence of tricuspid stenosis. Aortic Valve: The aortic valve is tricuspid. Aortic valve regurgitation is mild to moderate. No aortic stenosis is present. Pulmonic Valve: The pulmonic valve was normal in structure. Pulmonic valve regurgitation is not visualized. No evidence of pulmonic stenosis. Aorta: The aortic root is normal in size and structure. Venous: The inferior vena cava is normal in size with greater than 50% respiratory variability, suggesting right atrial pressure of 3 mmHg. IAS/Shunts: No atrial level shunt detected by color flow Doppler. Agitated saline contrast bubble study was positive with shunting observed within 3-6 cardiac cycles suggestive of interatrial shunt. A small patent  foramen ovale is detected with predominantly right to left shunting across the atrial septum. Julien Nordmann MD Electronically signed by Julien Nordmann MD Signature Date/Time: 02/13/2023/1:30:10 PM    Final     Cardiac Studies   2D echo 02/08/2023: 1. Left ventricular ejection fraction, by estimation, is 60 to 65%. The  left ventricle has normal function. The left ventricle has no regional  wall motion abnormalities. There is mild left ventricular hypertrophy.  Left ventricular diastolic parameters  are indeterminate.   2. Right ventricular systolic function is normal. The right ventricular  size is normal. There is mildly elevated pulmonary artery systolic  pressure.   3. Left atrial size was moderately dilated.   4. The mitral valve is normal in structure. Mild to moderate mitral valve  regurgitation. No evidence of mitral stenosis.   5. The aortic valve has an indeterminant number of cusps. Aortic valve  regurgitation is mild to moderate. No aortic stenosis is present.   6. The inferior vena cava is normal in size with greater than 50%  respiratory variability, suggesting right atrial pressure of 3 mmHg.   7. Agitated saline contrast bubble study was negative, with no evidence  of any interatrial shunt.   Patient Profile     77 y.o. female with history of seizures, hypothyroidism, mechanical falls, iron deficiency anemia, anxiety/panic attacks, GERD, migraines, and obeisty admitted with  recurrent falls and who we are seeing for Afib with RVR of uncertain duration.   Assessment & Plan    1. Afib with RVR: Status post successful TEE guided cardioversion. Continue anticoagulation with Eliquis. I will consolidate metoprolol to tartrate to 25 mg twice daily. Continue digoxin for now but suspect that this can likely be discontinued in the outpatient setting if she remains in sinus rhythm. If she develops recurrent atrial fibrillation, recommend an antiarrhythmic medication such as  amiodarone. I suspect that she will be ready for discharge tomorrow from a cardiac standpoint.  There are plans for discharge to skilled nursing facility which seems to be appropriate given her significant deconditioning.  2.  Acute diastolic heart failure: In the setting of A-fib.  I added small dose furosemide 20 mg daily.   3. Iron deficiency anemia: -Stable  4. History of falls: -PT/OT      For questions or updates, please contact [facility] Please consult [email or web address] for contact info under Cardiology/STEMI.    Signed, Lorine Bears, MD [facility] 02/13/2023, 2:09 PM

## 2023-02-13 NOTE — CV Procedure (Signed)
Cardioversion procedure note For atrial fibrillation with RVR  Procedure Details:  Consent: Risks of procedure as well as the alternatives and risks of each were explained to the (patient/caregiver).  Consent for procedure obtained.  Time Out: Verified patient identification, verified procedure, site/side was marked, verified correct patient position, special equipment/implants available, medications/allergies/relevent history reviewed, required imaging and test results available.  Performed  Patient placed on cardiac monitor, pulse oximetry, supplemental oxygen as necessary.   Sedation given: propofol IV, Dr. Joelene Millin Pacer pads placed anterior and posterior chest.   Cardioverted 1 time(s).   Cardioverted at  150 J. Synchronized biphasic Converted to NSR   Evaluation: Findings: Post procedure EKG shows: NSR, rate 60 bpm Complications: None Patient did tolerate procedure well.  Time Spent Directly with the Patient:  45 minutes   Dossie Arbour, M.D., Ph.D.

## 2023-02-13 NOTE — Progress Notes (Addendum)
PT Cancellation Note  Patient Details Name: SHUNTE SENSENEY MRN: [id number] DOB: 03-23-1946   Cancelled Treatment:     Pt off unit this PM for procedure.  Will continue as appropriate at a later time/date.   Danielle Dess 02/13/2023, 1:47 PM

## 2023-02-13 NOTE — TOC Progression Note (Addendum)
Transition of Care [facility]) - Progression Note    Patient Details  Name: [name] MRN: [id number] Date of Birth: Jun 24, 1946  Transition of Care [facility] CM/SW Contact  Truddie Hidden, RN Phone Number: 02/13/2023, 12:31 PM  Clinical Narrative:    Retrieved call from [facility]. Spoke with [name] and the Director to advised of discharge plan to SNF.   Attempt to contact patient's son to give bed offers. No answer. Left a message.  Attempt to contact patient's spouse to give bed offers. Phone is not in working order.   Spoke with patient's son, [name]  to give bed offers for [facility], [facility], [facility] and [facility].  He would like to research facilites and will have an answer soon.    Expected Discharge Plan: Skilled Nursing Facility Barriers to Discharge: Continued Medical Work up  Expected Discharge Plan and Services     Post Acute Care Choice: Skilled Nursing Facility Living arrangements for the past 2 months: Assisted Living Facility                                       Social Determinants of Health (SDOH) Interventions SDOH Screenings   Food Insecurity: No Food Insecurity (02/09/2023)  Housing: Low Risk  (02/09/2023)  Transportation Needs: No Transportation Needs (02/09/2023)  Utilities: Not At Risk (02/09/2023)  Depression (PHQ2-9): High Risk (03/31/2022)  Tobacco Use: Low Risk  (02/09/2023)    Readmission Risk Interventions     No data to display

## 2023-02-13 NOTE — Progress Notes (Signed)
  Progress Note   Patient: [name]:[id number] DOB: 1946-03-07 DOA: 02/07/2023     5 DOS: the patient was seen and examined on 02/13/2023   Brief hospital course: [name] is a 77 y.o. female with medical history significant for seizures, hypothyroidism, anxiety, depression, cardiac history presented for evaluation of frequent falls, found to have heart rate of 124 was started on Cardizem drip admitted to hospitalist service. Patient is seen by Cardiologist, cardizem gtt transitioned to 60mg  oral q6 hrs, beta blocker titrated, started low dose digoxin. Given CHADs2 score of 3 initiated Eliquis therapy. EP physician performed TEE with cardioversion today with successful return to sinus rhythm.   Assessment and Plan: Afib with RVR- S/p TEE cardioversion. Hypoxia improved. Continue Metoprolol 25 bid, eliquis. Continue digoxin therapy. Telemetry monitoring.  Acute diastolic CHF Cardiology team advised Lasix 20 mg daily. Continue daily weights, strict input and output.  Recurrent falls- PT/ OT recommended SNF. Continue fall, aspiration precautions.  Aerococcus UTI - finished Keflex therapy  Chronic diarrhea- unclear etiology. Continue supportive care. Outpatient GI work up.  Thrombocytopenia - improved.  DVT prophylaxis- Eliquis. CODE STATUS - FULL CODE.     Subjective: Patient is seen and examined today morning. She is lying comfortably, denies any complaints. HR 50 -120. Patient had TEE cardioversion with sinus, HR 68.  Physical Exam: Vitals:   02/13/23 1315 02/13/23 1330 02/13/23 1345 02/13/23 1417  BP: (!) 91/51 101/63 106/60 (!) 105/92  Pulse: (!) 51 (!) 52 (!) 56 62  Resp: 18 17 15 15   Temp:    98.4 F (36.9 C)  TempSrc:    Oral  SpO2: 96% 95% 96% 96%  Weight:      Height:       General - Elderly obese Caucasian female, no apparent distress noted HEENT - PERRLA, EOMI, atraumatic head, non tender sinuses. Lung - Clear, rales, rhonchi, wheezes. Heart - S1,  S2 heard, no murmurs, rubs, trace pedal edema Neuro - Alert, awake and oriented, non focal exam. Skin - Warm and dry.  Data Reviewed:  BMP Na 134, calcium 7.9, glucose 103  Family Communication: Patient is  Disposition: Status is: Inpatient Remains inpatient appropriate because: s/p cardioversion, need tele, rate control medications adjustment.  Planned Discharge Destination: Skilled nursing facility    Time spent: 42 minutes  Author: Marcelino Duster, MD 02/13/2023 3:44 PM  For on call review [email or web address].

## 2023-02-13 NOTE — Plan of Care (Signed)
  Problem: Clinical Measurements: Goal: Will remain free from infection Outcome: Progressing   Problem: Clinical Measurements: Goal: Diagnostic test results will improve Outcome: Progressing   Problem: Coping: Goal: Level of anxiety will decrease Outcome: Progressing   Problem: Pain Managment: Goal: General experience of comfort will improve Outcome: Progressing   Problem: Safety: Goal: Ability to remain free from injury will improve Outcome: Progressing

## 2023-02-13 NOTE — Progress Notes (Signed)
*  PRELIMINARY RESULTS* Echocardiogram Echocardiogram Transesophageal has been performed.  Cristela Blue 02/13/2023, 1:22 PM

## 2023-02-13 NOTE — Progress Notes (Signed)
Transesophageal Echocardiogram :  Indication: Persistent atrial fibrillation with RVR Requesting/ordering  physician:  Dr. Clide Dales  Procedure: Benzocaine spray x2 and 2 mls x 2 of viscous lidocaine were given orally to provide local anesthesia to the oropharynx. The patient was positioned supine on the left side, bite block provided. The patient was moderately sedated with the doses of versed and fentanyl as detailed below.  Using digital technique an omniplane probe was advanced into the distal esophagus without incident.   Moderate sedation: 1. Sedation used: Per anesthesia, 90 of propofol  See report in EPIC  for complete details: In brief,  Left ventricular ejection fraction, by estimation, is 50 to 55%. The left ventricle has low normal function. The left ventricle has no regional wall motion abnormalities.  Right ventricular systolic function is low normal. The right ventricular size is normal. Left atrial size was moderately dilated.  No left atrial/left atrial appendage thrombus was detected. Right atrial size was moderately dilated. The mitral valve is normal in structure. Moderate mitral valve regurgitation. No evidence of mitral stenosis.  The aortic valve is tricuspid. Aortic valve regurgitation is mild to moderate. No aortic stenosis is present. The inferior vena cava is normal in size with greater than 50% respiratory variability, suggesting right atrial pressure of 3 mmHg.  Agitated saline contrast bubble study was positive with shunting observed within 3-6 cardiac cycles suggestive of interatrial shunt. There is a small patent foramen ovale with predominantly right to left shunting across the atrial septum.  Cardioversion to follow   Julien Nordmann 02/13/2023 1:20 PM

## 2023-02-13 NOTE — Transfer of Care (Signed)
Immediate Anesthesia Transfer of Care Note  Patient: [name]  Procedure(s) Performed: CARDIOVERSION TRANSESOPHAGEAL ECHOCARDIOGRAM (TEE)  Patient Location:  spu  Anesthesia Type:General  Level of Consciousness: drowsy  Airway & Oxygen Therapy: Patient Spontanous Breathing and Patient connected to nasal cannula oxygen  Post-op Assessment: Report given to RN and Post -op Vital signs reviewed and stable  Post vital signs: Reviewed  Last Vitals:  Vitals Value Taken Time  BP 107/57 02/13/23 1312  Temp    Pulse 51 02/13/23 1313  Resp 17 02/13/23 1313  SpO2 96 % 02/13/23 1313  Vitals shown include unvalidated device data.  Last Pain:  Vitals:   02/13/23 1145  TempSrc: Oral  PainSc: 0-No pain      Patients Stated Pain Goal: 0 (02/13/23 0744)  Complications: No notable events documented.

## 2023-02-14 ENCOUNTER — Encounter: Payer: Self-pay | Admitting: Cardiovascular Disease

## 2023-02-14 DIAGNOSIS — R0602 Shortness of breath: Secondary | ICD-10-CM

## 2023-02-14 DIAGNOSIS — E669 Obesity, unspecified: Secondary | ICD-10-CM | POA: Diagnosis not present

## 2023-02-14 DIAGNOSIS — W19XXXA Unspecified fall, initial encounter: Secondary | ICD-10-CM | POA: Diagnosis not present

## 2023-02-14 DIAGNOSIS — N179 Acute kidney failure, unspecified: Secondary | ICD-10-CM | POA: Diagnosis not present

## 2023-02-14 DIAGNOSIS — I4891 Unspecified atrial fibrillation: Secondary | ICD-10-CM | POA: Diagnosis not present

## 2023-02-14 DIAGNOSIS — I6300 Cerebral infarction due to thrombosis of unspecified precerebral artery: Secondary | ICD-10-CM | POA: Diagnosis not present

## 2023-02-14 DIAGNOSIS — N3000 Acute cystitis without hematuria: Secondary | ICD-10-CM | POA: Diagnosis not present

## 2023-02-14 DIAGNOSIS — F03918 Unspecified dementia, unspecified severity, with other behavioral disturbance: Secondary | ICD-10-CM | POA: Diagnosis not present

## 2023-02-14 DIAGNOSIS — R2681 Unsteadiness on feet: Secondary | ICD-10-CM | POA: Diagnosis not present

## 2023-02-14 DIAGNOSIS — I959 Hypotension, unspecified: Secondary | ICD-10-CM | POA: Diagnosis not present

## 2023-02-14 LAB — BASIC METABOLIC PANEL
Anion gap: 8 (ref 5–15)
BUN: 13 mg/dL (ref 8–23)
CO2: 25 mmol/L (ref 22–32)
Calcium: 8.1 mg/dL — ABNORMAL LOW (ref 8.9–10.3)
Chloride: 100 mmol/L (ref 98–111)
Creatinine, Ser: 0.73 mg/dL (ref 0.44–1.00)
GFR, Estimated: >60 mL/min (ref >60)
Glucose, Bld: 93 mg/dL (ref 70–99)
Potassium: 4.1 mmol/L (ref 3.5–5.1)
Sodium: 133 mmol/L — ABNORMAL LOW (ref 135–145)

## 2023-02-14 NOTE — Progress Notes (Signed)
  Progress Note   Patient: [name] XBJ:[id number] DOB: 11-27-1945 DOA: 02/07/2023     6 DOS: the patient was seen and examined on 02/14/2023   Brief hospital course: ROXSANA RIDING is a 77 y.o. female with medical history significant for seizures, hypothyroidism, anxiety, depression, cardiac history presented for evaluation of frequent falls, found to have heart rate of 124 was started on Cardizem drip admitted to hospitalist service. Patient is seen by Cardiologist, cardizem gtt transitioned to 60mg  oral q6 hrs, beta blocker titrated, started low dose digoxin. Given CHADs2 score of 3 initiated Eliquis therapy. EP physician performed TEE with cardioversion today with successful return to sinus rhythm. HR 40-80 beta blocker, digoxin held for now.  Assessment and Plan: Afib with RVR- S/p TEE cardioversion. Hypoxia improved. Digoxin, metoprolol held due to low HR per cardiology. Cardiology follow up appreciated. Telemetry monitoring.  Acute diastolic CHF Continue Lasix 20 mg daily. Continue daily weights, strict input and output.  Recurrent falls- PT/ OT recommended SNF. Continue fall, aspiration precautions.  Aerococcus UTI - finished Keflex therapy  Chronic diarrhea- unclear etiology. Continue supportive care. Outpatient GI work up.  Thrombocytopenia - improved.  DVT prophylaxis- Eliquis. CODE STATUS - FULL CODE.  Medically stable to be discharged to SNF, awaiting placement.     Subjective: Patient is seen and examined today morning. She is lying comfortably, denies any complaints. Asks for pain meds. HR 40-80 noted.  Physical Exam: Vitals:   02/14/23 0834 02/14/23 1222 02/14/23 1638 02/14/23 1649  BP: 111/62 130/72 137/73 138/66  Pulse: 63 (!) 54 61 (!) 56  Resp: 20 18 18 20   Temp: 98 F (36.7 C) 97.8 F (36.6 C) 98.3 F (36.8 C) 99 F (37.2 C)  TempSrc:      SpO2: 92% 92% 93% 96%  Weight:      Height:       General - Elderly obese Caucasian female, no apparent  distress noted HEENT - PERRLA, EOMI, atraumatic head, non tender sinuses. Lung - Clear, rales, rhonchi, wheezes. Heart - S1, S2 heard, no murmurs, rubs, trace pedal edema Neuro - Alert, awake and oriented, non focal exam. Skin - Warm and dry.  Data Reviewed:  BMP Na 134, calcium 7.9, glucose 103  Family Communication: Patient is  Disposition: Status is: Inpatient Remains inpatient appropriate because: s/p cardioversion, need tele, rate control medications adjustment.  Planned Discharge Destination: Skilled nursing facility    Time spent: 42 minutes  Author: Marcelino Duster, MD 02/14/2023 5:30 PM  For on call review [email or web address].

## 2023-02-14 NOTE — Progress Notes (Signed)
Progress Note  Patient Name: [name] Date of Encounter: 02/14/2023  Primary Cardiologist: Mariah Milling  Subjective   Maintaining sinus rhythm followed TEE/DCCV 6/11. No angina or symptoms concerning for cardiac decompensation. Has been bradycardic into the 40s bpm.   Inpatient Medications    Scheduled Meds:  apixaban  5 mg Oral BID   cholecalciferol  2,000 Units Oral Daily   divalproex  125 mg Oral BID   furosemide  20 mg Oral Daily   hyoscyamine  0.125 mg Oral BID   levothyroxine  100 mcg Oral Daily   lidocaine  1 patch Transdermal Daily   oxyCODONE  10 mg Oral Q8H   pantoprazole  40 mg Oral Daily   pregabalin  100 mg Oral BID   QUEtiapine  50 mg Oral QHS   ramelteon  8 mg Oral QHS   vortioxetine HBr  20 mg Oral Daily   Continuous Infusions:  PRN Meds: acetaminophen **OR** acetaminophen, mouth rinse, SUMAtriptan, traZODone   Vital Signs    Vitals:   02/13/23 1800 02/13/23 1918 02/13/23 2253 02/14/23 0332  BP: (!) 116/58 123/60 (!) 97/50 101/64  Pulse:  62 61 62  Resp: 16 18 18    Temp:  99.1 F (37.3 C) 98.8 F (37.1 C) 98.9 F (37.2 C)  TempSrc:  Oral Oral Oral  SpO2:  97% 95% 96%  Weight:      Height:        Intake/Output Summary (Last 24 hours) at 02/14/2023 0828 Last data filed at 02/14/2023 0400 Gross per 24 hour  Intake 780 ml  Output 2650 ml  Net -1870 ml    Filed Weights   02/07/23 1912  Weight: 102 kg    Telemetry    Sinus rhythm with sinus bradycardia with rates in the 40s to 80s bpm - Personally Reviewed  ECG    No new tracings - Personally Reviewed  Physical Exam   GEN: No acute distress.   Neck: No JVD. Cardiac: Regular rate and rhythm, no murmurs, rubs, or gallops.  Respiratory: Clear to auscultation bilaterally.  GI: Soft, nontender, non-distended.   MS: No edema; No deformity. Neuro:  Alert and oriented x 3; Nonfocal.  Psych: Normal affect.  Labs    Chemistry Recent Labs  Lab 02/07/23 1933 02/08/23 0513  02/09/23 0434 02/10/23 0746 02/12/23 0536 02/13/23 0622 02/14/23 0342  NA 142 140 140   < > 135 134* 133*  K 3.4* 3.3* 3.5   < > 4.3 4.0 4.1  CL 105 106 105   < > 100 99 100  CO2 29 24 27    < > 28 26 25   GLUCOSE 52* 95 104*   < > 93 103* 93  BUN 19 16 11    < > 14 13 13   CREATININE 1.07* 0.79 0.94   < > 0.82 0.70 0.73  CALCIUM 7.8* 7.9* 8.0*   < > 8.4* 7.9* 8.1*  PROT 6.3* 5.8* 5.5*  --   --   --   --   ALBUMIN 3.3* 2.9* 2.8*  --   --   --   --   AST 17 18 15   --   --   --   --   ALT 11 9 10   --   --   --   --   ALKPHOS 87 77 69  --   --   --   --   BILITOT 0.5 0.5 0.6  --   --   --   --  GFRNONAA 53* >60 >60   < > >60 >60 >60  ANIONGAP 8 10 8    < > 7 9 8    < > = values in this interval not displayed.      Hematology Recent Labs  Lab 02/10/23 0746 02/11/23 0533 02/12/23 0536  WBC 6.5 5.9 6.8  RBC 3.88 3.86* 4.48  HGB 10.1* 10.0* 11.5*  HCT 32.4* 32.2* 36.3  MCV 83.5 83.4 81.0  MCH 26.0 25.9* 25.7*  MCHC 31.2 31.1 31.7  RDW 17.0* 16.7* 16.1*  PLT 132* 142* 158     Cardiac EnzymesNo results for input(s): "TROPONINI" in the last 168 hours. No results for input(s): "TROPIPOC" in the last 168 hours.   BNP Recent Labs  Lab 02/07/23 1933  BNP 804.3*      DDimer No results for input(s): "DDIMER" in the last 168 hours.   Radiology     Cardiac Studies   2D echo 02/08/2023: 1. Left ventricular ejection fraction, by estimation, is 60 to 65%. The  left ventricle has normal function. The left ventricle has no regional  wall motion abnormalities. There is mild left ventricular hypertrophy.  Left ventricular diastolic parameters  are indeterminate.   2. Right ventricular systolic function is normal. The right ventricular  size is normal. There is mildly elevated pulmonary artery systolic  pressure.   3. Left atrial size was moderately dilated.   4. The mitral valve is normal in structure. Mild to moderate mitral valve  regurgitation. No evidence of mitral  stenosis.   5. The aortic valve has an indeterminant number of cusps. Aortic valve  regurgitation is mild to moderate. No aortic stenosis is present.   6. The inferior vena cava is normal in size with greater than 50%  respiratory variability, suggesting right atrial pressure of 3 mmHg.   7. Agitated saline contrast bubble study was negative, with no evidence  of any interatrial shunt.  __________  TEE 02/13/2023: 1. Left ventricular ejection fraction, by estimation, is 50 to 55%. The  left ventricle has low normal function. The left ventricle has no regional  wall motion abnormalities.   2. Right ventricular systolic function is low normal. The right  ventricular size is normal.   3. Left atrial size was moderately dilated. No left atrial/left atrial  appendage thrombus was detected.   4. Right atrial size was moderately dilated.   5. The mitral valve is normal in structure. Moderate mitral valve  regurgitation. No evidence of mitral stenosis.   6. The aortic valve is tricuspid. Aortic valve regurgitation is mild to  moderate. No aortic stenosis is present.   7. The inferior vena cava is normal in size with greater than 50%  respiratory variability, suggesting right atrial pressure of 3 mmHg.   8. Agitated saline contrast bubble study was positive with shunting  observed within 3-6 cardiac cycles suggestive of interatrial shunt. There  is a small patent foramen ovale with predominantly right to left shunting  across the atrial septum.   Patient Profile     77 y.o. female with history of seizures, hypothyroidism, mechanical falls, iron deficiency anemia, anxiety/panic attacks, GERD, migraines, and obeisty admitted with recurrent falls and who we are seeing for Afib with RVR of uncertain duration.   Assessment & Plan    1. Afib with RVR: -Status post successful TEE guided cardioversion on 6/11, maintaining sinus rhythm with rates in the 40s to 80s bpm.  -Hold digoxin and metoprolol  for now, look to resume metoprolol  moving forward as rates improve.  -CHADS2VASc at least 3. -Continue anticoagulation with Eliquis 5 mg bid (she does not meet reduced dosing criteria at this time), will need to monitor fall risk with history of falls. -Consider outpatient EP evaluation for Watchman given history of falls. -If she develops recurrent atrial fibrillation, recommend an antiarrhythmic medication such as amiodarone.  2.  Acute diastolic heart failure:  -In the setting of A-fib.   -On furosemide 20 mg daily.  3. Iron deficiency anemia: -Stable  4. History of falls: -PT/OT      For questions or updates, please contact [facility] Please consult [email or web address] for contact info under Cardiology/STEMI.    Signed, Lorine Bears, MD [facility] 02/14/2023, 8:28 AM

## 2023-02-14 NOTE — TOC Progression Note (Signed)
Transition of Care [facility]) - Progression Note    Patient Details  Name: [name] MRN: [id number] Date of Birth: Jun 13, 1946  [facility] CM/SW Contact  Truddie Hidden, RN Phone Number: 02/14/2023, 4:26 PM  Clinical Narrative:    Spoke with patient's son, [name] regarding bed offers. He has Music therapist.   Expected Discharge Plan: Skilled Nursing Facility Barriers to Discharge: Continued Medical Work up  Expected Discharge Plan and Services     Post Acute Care Choice: Skilled Nursing Facility Living arrangements for the past 2 months: Assisted Living Facility                                       Social Determinants of Health (SDOH) Interventions SDOH Screenings   Food Insecurity: No Food Insecurity (02/09/2023)  Housing: Low Risk  (02/09/2023)  Transportation Needs: No Transportation Needs (02/09/2023)  Utilities: Not At Risk (02/09/2023)  Depression (PHQ2-9): High Risk (03/31/2022)  Tobacco Use: Low Risk  (02/14/2023)    Readmission Risk Interventions     No data to display

## 2023-02-14 NOTE — Progress Notes (Signed)
Physical Therapy Treatment Patient Details Name: [name] MRN: [id number] DOB: 02/13/1946 Today's Date: 02/14/2023   History of Present Illness Pt is a 77 y.o. female with history of overt obesity, seizures, hypothyroidism, mechanical falls, iron deficiency anemia, anxiety/panic attacks. Pt admitted to ED 6/5 after a fall at [facility], no significant trauma to body reported.  S/p cardioversion 6/11.    PT Comments    Pt pleasant but anxious t/o the session, HR in the 60s and O2 high 90s at rest, HR up to 80s and O2 low 90s during activity on room air.  On arrival found to have urine saturated chair, PT assisted with linen/mesh pantie change and clean up - nursing notified.   Pt was willing to work with PT and did manage to ambualte ~45 ft into the hall with slow, labored but safe gait.  Pt highly reliant leaning on the walker and needing to quickly sit 2/2 reported fatigue.  Continue with POC to address functional limitations.   Recommendations for follow up therapy are one component of a multi-disciplinary discharge planning process, led by the attending physician.  Recommendations may be updated based on patient status, additional functional criteria and insurance authorization.  Follow Up Recommendations  Can patient physically be transported by private vehicle: No    Assistance Recommended at Discharge Intermittent Supervision/Assistance  Patient can return home with the following A lot of help with walking and/or transfers;A little help with bathing/dressing/bathroom;Help with stairs or ramp for entrance;Assist for transportation   Equipment Recommendations   (TBD)    Recommendations for Other Services       Precautions / Restrictions Precautions Precautions: Fall Restrictions Weight Bearing Restrictions: No     Mobility  Bed Mobility               General bed mobility comments: in recliner pre/post session    Transfers Overall transfer level: Needs  assistance Equipment used: Rolling walker (2 wheels) Transfers: Sit to/from Stand Sit to Stand: Min guard           General transfer comment: 3 sit to stand efforts, one from elevated chair height, 2 from standard height.  Struggled from lower height needing increased cuing for set up and UE use    Ambulation/Gait Ambulation/Gait assistance: Min Chemical engineer (Feet): 45 Feet Assistive device: Rolling walker (2 wheels)         General Gait Details: Pt with labored but good effort during ambulation.  She was heavily reliant on the walker with foward lean but had no LOBs.  She was relatively quick to fatigue with the effort, HR 60s up to 80s with the effort, difficult to get consistent SpO2 reading but appeared to stay in the 90s on room air t/o the effort.   Stairs             Wheelchair Mobility    Modified Rankin (Stroke Patients Only)       Balance Overall balance assessment: Needs assistance Sitting-balance support: No upper extremity supported, Feet unsupported Sitting balance-Leahy Scale: Good     Standing balance support: Reliant on assistive device for balance Standing balance-Leahy Scale: Poor                              Cognition Arousal/Alertness: Awake/alert Behavior During Therapy: Anxious Overall Cognitive Status: History of cognitive impairments - at baseline  Exercises      General Comments General comments (skin integrity, edema, etc.): Pt reports baseline incontinency and requests pure wick, passed to members of nursing staff      Pertinent Vitals/Pain Pain Assessment Pain Assessment: Faces Faces Pain Scale: Hurts even more Pain Location: headache    Home Living                          Prior Function            PT Goals (current goals can now be found in the care plan section) Progress towards PT goals: Progressing toward goals     Frequency    Min 3X/week      PT Plan Current plan remains appropriate    Co-evaluation              AM-PAC PT "6 Clicks" Mobility   Outcome Measure  Help needed turning from your back to your side while in a flat bed without using bedrails?: A Little Help needed moving from lying on your back to sitting on the side of a flat bed without using bedrails?: A Little Help needed moving to and from a bed to a chair (including a wheelchair)?: A Little Help needed standing up from a chair using your arms (e.g., wheelchair or bedside chair)?: A Little Help needed to walk in hospital room?: A Lot Help needed climbing 3-5 steps with a railing? : Total 6 Click Score: 15    End of Session Equipment Utilized During Treatment: Gait belt Activity Tolerance: Patient tolerated treatment well Patient left: in chair;with call bell/phone within reach;with chair alarm set Nurse Communication: Mobility status PT Visit Diagnosis: Unsteadiness on feet (R26.81);Muscle weakness (generalized) (M62.81);History of falling (Z91.81)     Time: 2956-2130 PT Time Calculation (min) (ACUTE ONLY): 27 min  Charges:  $Gait Training: 8-22 mins $Therapeutic Activity: 8-22 mins                     Malachi Pro, DPT 02/14/2023, 3:46 PM

## 2023-02-15 DIAGNOSIS — E876 Hypokalemia: Secondary | ICD-10-CM

## 2023-02-15 DIAGNOSIS — F1120 Opioid dependence, uncomplicated: Secondary | ICD-10-CM

## 2023-02-15 DIAGNOSIS — I4891 Unspecified atrial fibrillation: Secondary | ICD-10-CM | POA: Diagnosis not present

## 2023-02-15 DIAGNOSIS — I480 Paroxysmal atrial fibrillation: Secondary | ICD-10-CM | POA: Diagnosis not present

## 2023-02-15 DIAGNOSIS — F03918 Unspecified dementia, unspecified severity, with other behavioral disturbance: Secondary | ICD-10-CM

## 2023-02-15 DIAGNOSIS — R2681 Unsteadiness on feet: Secondary | ICD-10-CM | POA: Diagnosis not present

## 2023-02-15 DIAGNOSIS — I959 Hypotension, unspecified: Secondary | ICD-10-CM | POA: Diagnosis not present

## 2023-02-15 DIAGNOSIS — N3000 Acute cystitis without hematuria: Secondary | ICD-10-CM | POA: Diagnosis not present

## 2023-02-15 DIAGNOSIS — R0602 Shortness of breath: Secondary | ICD-10-CM | POA: Diagnosis not present

## 2023-02-15 MED ORDER — OXYCODONE HCL 5 MG PO TABS
5.0000 mg | ORAL_TABLET | Freq: Once | ORAL | Status: AC
  Administered 2023-02-15: 5 mg via ORAL
  Filled 2023-02-15: qty 1

## 2023-02-15 NOTE — Care Management Important Message (Signed)
Important Message  Patient Details  Name: [name] MRN: [id number] Date of Birth: 1946-04-19   Medicare Important Message Given:  Yes     Johnell Comings 02/15/2023, 2:47 PM

## 2023-02-15 NOTE — Progress Notes (Signed)
Occupational Therapy Treatment Patient Details Name: [name] MRN: [id number] DOB: 04-06-1946 Today's Date: 02/15/2023   History of present illness Pt is a 77 y.o. female with history of overt obesity, seizures, hypothyroidism, mechanical falls, iron deficiency anemia, anxiety/panic attacks. Pt admitted to ED 6/5 after a fall at [facility], no significant trauma to body reported.  S/p cardioversion 6/11.   OT comments  Pt seen for OT treatment on this date. Upon arrival to room pt laying in bed, agreeable to tx. Pt requires MIN A for bed mobility. MIN A transfer with RW to [facility]. Pericare performed with supervision during lateral leans, MIN A during sit<>stand. Setup during oral hygiene. Pt reported headache with increased relief during mobility. Pt making progress toward goals, will continue to follow POC. Discharge recommendation remains appropriate.     Recommendations for follow up therapy are one component of a multi-disciplinary discharge planning process, led by the attending physician.  Recommendations may be updated based on patient status, additional functional criteria and insurance authorization.    Assistance Recommended at Discharge Intermittent Supervision/Assistance  Patient can return home with the following  A lot of help with walking and/or transfers;A lot of help with bathing/dressing/bathroom   Equipment Recommendations  Other (comment) (defer)    Recommendations for Other Services      Precautions / Restrictions Precautions Precautions: Fall Restrictions Weight Bearing Restrictions: No       Mobility Bed Mobility Overal bed mobility: Needs Assistance Bed Mobility: Supine to Sit, Sit to Supine     Supine to sit: Supervision Sit to supine: Min assist        Transfers Overall transfer level: Needs assistance Equipment used: Rolling walker (2 wheels) Transfers: Bed to chair/wheelchair/BSC Sit to Stand: Min assist     Step pivot transfers: Min  assist     General transfer comment: MIN A sit<>stand x4 for pericare with RW     Balance Overall balance assessment: Needs assistance Sitting-balance support: No upper extremity supported, Feet unsupported Sitting balance-Leahy Scale: Good Sitting balance - Comments: tolerated sitting on 3in1 for 5+minutes Postural control: Posterior lean Standing balance support: Reliant on assistive device for balance Standing balance-Leahy Scale: Poor Standing balance comment: Could maintain standing balance but needed BUE to feel stable.                           ADL either performed or assessed with clinical judgement   ADL Overall ADL's : Needs assistance/impaired     Grooming: Oral care;Set up;Sitting                   Toilet Transfer: Minimal assistance;BSC/3in1   Toileting- Clothing Manipulation and Hygiene: Minimal assistance;Sitting/lateral lean;Sit to/from stand       Functional mobility during ADLs: Minimal assistance;Rolling walker (2 wheels)      Extremity/Trunk Assessment Upper Extremity Assessment Upper Extremity Assessment: Overall WFL for tasks assessed   Lower Extremity Assessment Lower Extremity Assessment: Overall WFL for tasks assessed        Vision       Perception     Praxis      Cognition Arousal/Alertness: Awake/alert Behavior During Therapy: Anxious Overall Cognitive Status: History of cognitive impairments - at baseline  Exercises      Shoulder Instructions       General Comments      Pertinent Vitals/ Pain       Pain Assessment Pain Assessment: 0-10 Pain Score: 4  Pain Location: headache Pain Descriptors / Indicators: Shooting Pain Intervention(s): Relaxation, Repositioned  Home Living                                          Prior Functioning/Environment              Frequency  Min 2X/week        Progress Toward  Goals  OT Goals(current goals can now be found in the care plan section)  Progress towards OT goals: Progressing toward goals  Acute Rehab OT Goals Patient Stated Goal: to feel better OT Goal Formulation: With patient Time For Goal Achievement:  Potential to Achieve Goals: Fair ADL Goals Pt Will Perform Grooming: with supervision;standing Pt Will Perform Lower Body Dressing: with supervision;sit to/from stand Pt Will Transfer to Toilet: with supervision;ambulating Pt Will Perform Toileting - Clothing Manipulation and hygiene: with supervision;sit to/from stand  Plan Discharge plan remains appropriate;Frequency remains appropriate    Co-evaluation                 AM-PAC OT "6 Clicks" Daily Activity     Outcome Measure   Help from another person eating meals?: None Help from another person taking care of personal grooming?: A Little Help from another person toileting, which includes using toliet, bedpan, or urinal?: A Lot Help from another person bathing (including washing, rinsing, drying)?: A Lot Help from another person to put on and taking off regular upper body clothing?: A Little Help from another person to put on and taking off regular lower body clothing?: A Lot 6 Click Score: 16    End of Session Equipment Utilized During Treatment: Rolling walker (2 wheels)  OT Visit Diagnosis: Unsteadiness on feet (R26.81);Muscle weakness (generalized) (M62.81);Repeated falls (R29.6);History of falling (Z91.81)   Activity Tolerance Patient tolerated treatment well;No increased pain   Patient Left in chair;with call bell/phone within reach;with bed alarm set   Nurse Communication          Time: 1610-9604 OT Time Calculation (min): 28 min  Charges: OT General Charges $OT Visit: 1 Visit OT Treatments $Self Care/Home Management : 23-37 mins  Thresa Ross, OTS

## 2023-02-15 NOTE — Progress Notes (Addendum)
Physical Therapy Treatment Patient Details Name: [name] MRN: [id number] DOB: [birth year] Today's Date: 02/15/2023   History of Present Illness Pt is a 77 y.o. female with history of overt obesity, seizures, hypothyroidism, mechanical falls, iron deficiency anemia, anxiety/panic attacks. Pt admitted to ED 6/5 after a fall at [facility], no significant trauma to body reported.  S/p cardioversion 6/11.    PT Comments    Pt pleasant and motivated for therapy. Part of session included helping pt to [facility] for BM. Pt able to perform all bed mobility tasks independently with supervision for safety. With STS she required min guard for safety and UE use on the RW. Pt demonstrated improved standing balance today and was able to tolerate ~15 seconds of standing with no UE support while cleaning after the BM, no overt LOB. Pt was able to perform 3 bouts of ambulation with RW and min guard for safety, the longest bout being ~35 feet. She needed a 1-2 min seated rest break in between each bout of ambulation. Pt required verbal cues for upright posture but was unable to tolerate this position while walking for longer than a few seconds; heavy UE use on RW. Vitals remained stable throughout the session, with HR remaining in low to mid 70's and O2 in low to mid 90's. Pt will benefit from continued PT services upon discharge to safely address deficits listed in patient problem list for decreased caregiver assistance and eventual return to PLOF.   Recommendations for follow up therapy are one component of a multi-disciplinary discharge planning process, led by the attending physician.  Recommendations may be updated based on patient status, additional functional criteria and insurance authorization.  Follow Up Recommendations  Can patient physically be transported by private vehicle: No    Assistance Recommended at Discharge Intermittent Supervision/Assistance  Patient can return home with the following A  lot of help with walking and/or transfers;A little help with bathing/dressing/bathroom;Help with stairs or ramp for entrance;Assist for transportation   Equipment Recommendations       Recommendations for Other Services       Precautions / Restrictions Precautions Precautions: Fall Restrictions Weight Bearing Restrictions: No     Mobility  Bed Mobility Overal bed mobility: Needs Assistance Bed Mobility: Supine to Sit, Sit to Supine     Supine to sit: Supervision Sit to supine: Supervision   General bed mobility comments: Pt able to perform all bed mobility tasks independently with supervision for safety.    Transfers Overall transfer level: Needs assistance Equipment used: Rolling walker (2 wheels) Transfers: Sit to/from Stand Sit to Stand: Min guard           General transfer comment: Pt able to perform STS with min guard for safety. UE's on RW.    Ambulation/Gait   Gait Distance (Feet): 1 x 35 feet, 1 x 15 feet, 1 x 25 feet Assistive device: Rolling walker (2 wheels) Gait Pattern/deviations: Step-to pattern, Decreased step length - right, Decreased step length - left, Shuffle, Trunk flexed Gait velocity: decreased     General Gait Details: Pt demonstrated slow, labored cadence. Trunk flexed throughout gait even with verbal cues for upright posture and heavy UE use on RW. Min guard for safety but pt demonstrated no LOB while walking.   Stairs             Wheelchair Mobility    Modified Rankin (Stroke Patients Only)       Balance Overall balance assessment: Needs assistance Sitting-balance support:  Feet supported, No upper extremity supported Sitting balance-Leahy Scale: Good Sitting balance - Comments: tolerated sitting on 3in1 for 5+minutes   Standing balance support: No upper extremity supported Standing balance-Leahy Scale: Good Standing balance comment: Pt able to tolerate standing balance with no UE support x10 seconds while wiping after  BM. Pt used RW for ambulation to keep balance.                            Cognition Arousal/Alertness: Awake/alert Behavior During Therapy: WFL for tasks assessed/performed Overall Cognitive Status: Within Functional Limits for tasks assessed                                          Exercises Other Exercises Other Exercises: 3 STS at recliner    General Comments   Pt made good effort for peri care cleanup post BM but needed min assist to complete.      Pertinent Vitals/Pain Pain Assessment Pain Assessment: 0-10 Pain Score: 5  Pain Location: L knee with walking Pain Intervention(s): Monitored during session, Limited activity within patient's tolerance, Repositioned    Home Living                          Prior Function            PT Goals (current goals can now be found in the care plan section) Progress towards PT goals: Progressing toward goals    Frequency    Min 3X/week      PT Plan Current plan remains appropriate    Co-evaluation              AM-PAC PT "6 Clicks" Mobility   Outcome Measure  Help needed turning from your back to your side while in a flat bed without using bedrails?: A Little Help needed moving from lying on your back to sitting on the side of a flat bed without using bedrails?: A Little Help needed moving to and from a bed to a chair (including a wheelchair)?: A Little Help needed standing up from a chair using your arms (e.g., wheelchair or bedside chair)?: A Little Help needed to walk in hospital room?: A Little Help needed climbing 3-5 steps with a railing? : A Lot 6 Click Score: 17    End of Session Equipment Utilized During Treatment: Gait belt Activity Tolerance: Patient tolerated treatment well   Nurse Communication: Mobility status PT Visit Diagnosis: Unsteadiness on feet (R26.81);Muscle weakness (generalized) (M62.81);History of falling (Z91.81)     Time: 1338-1410 PT Time  Calculation (min) (ACUTE ONLY): 32 min  Charges:                        Timothy Crutchfield, SPT 02/15/23, 2:28 PM  This entire session was performed under direct supervision and direction of a licensed therapist/therapist assistant. I have personally read, edited and approve of the note as written.  Loran Senters, DPT

## 2023-02-15 NOTE — Progress Notes (Signed)
Rounding Note    Patient Name: [name] Date of Encounter: 02/15/2023  [facility] Cardiologist: [facility] Neubecker  Subjective   Resting comfortably in bed, eating breakfast, no complaints Heart rate remains low high 40s, 50s with movement up to 60s Digoxin and beta-blocker held yesterday for bradycardia Telemetry reviewed, maintaining sinus bradycardia  Inpatient Medications    Scheduled Meds:  apixaban  5 mg Oral BID   cholecalciferol  2,000 Units Oral Daily   divalproex  125 mg Oral BID   furosemide  20 mg Oral Daily   hyoscyamine  0.125 mg Oral BID   levothyroxine  100 mcg Oral Daily   lidocaine  1 patch Transdermal Daily   oxyCODONE  10 mg Oral Q8H   pantoprazole  40 mg Oral Daily   pregabalin  100 mg Oral BID   QUEtiapine  50 mg Oral QHS   ramelteon  8 mg Oral QHS   vortioxetine HBr  20 mg Oral Daily   Continuous Infusions:   PRN Meds: acetaminophen **OR** acetaminophen, mouth rinse, SUMAtriptan, traZODone   Vital Signs    Vitals:   02/15/23 0600 02/15/23 0700 02/15/23 0746 02/15/23 0800  BP:   (!) 131/49   Pulse:   (!) 52   Resp: 13 14 16 13   Temp:   98.4 F (36.9 C)   TempSrc:   Oral   SpO2:   94%   Weight:   99.4 kg   Height:        Intake/Output Summary (Last 24 hours) at 02/15/2023 1002 Last data filed at 02/15/2023 0410 Gross per 24 hour  Intake 240 ml  Output 2000 ml  Net -1760 ml      02/15/2023    7:46 AM 02/07/2023    7:12 PM 02/01/2023    2:57 PM  Last 3 Weights  Weight (lbs) 219 lb 2.2 oz 224 lb 13.9 oz 224 lb 13.9 oz  Weight (kg) 99.4 kg 102 kg 102 kg      Telemetry    Normal sinus rhythm rate 50-60 personally Reviewed  ECG     - Personally Reviewed  Physical Exam   Constitutional:  oriented to person, place, and time. No distress.  HENT:  Head: Grossly normal Eyes:  no discharge. No scleral icterus.  Neck: No JVD, no carotid bruits  Cardiovascular: Regular rate and rhythm, no murmurs  appreciated Pulmonary/Chest: Clear to auscultation bilaterally, no wheezes or rails Abdominal: Soft.  no distension.  no tenderness.  Musculoskeletal: Normal range of motion Neurological:  normal muscle tone. Coordination normal. No atrophy Skin: Skin warm and dry Psychiatric: normal affect, pleasant   Labs    High Sensitivity Troponin:   Recent Labs  Lab 02/07/23 2227 02/08/23 0047  TROPONINIHS 9 10     Chemistry Recent Labs  Lab 02/09/23 0434 02/10/23 0746 02/12/23 0536 02/13/23 0622 02/14/23 0342  NA 140   < > 135 134* 133*  K 3.5   < > 4.3 4.0 4.1  CL 105   < > 100 99 100  CO2 27   < > 28 26 25   GLUCOSE 104*   < > 93 103* 93  BUN 11   < > 14 13 13   CREATININE 0.94   < > 0.82 0.70 0.73  CALCIUM 8.0*   < > 8.4* 7.9* 8.1*  MG 1.9  --   --   --   --   PROT 5.5*  --   --   --   --  ALBUMIN 2.8*  --   --   --   --   AST 15  --   --   --   --   ALT 10  --   --   --   --   ALKPHOS 69  --   --   --   --   BILITOT 0.6  --   --   --   --   GFRNONAA >60   < > >60 >60 >60  ANIONGAP 8   < > 7 9 8    < > = values in this interval not displayed.    Lipids No results for input(s): "CHOL", "TRIG", "HDL", "LABVLDL", "LDLCALC", "CHOLHDL" in the last 168 hours.  Hematology Recent Labs  Lab 02/10/23 0746 02/11/23 0533 02/12/23 0536  WBC 6.5 5.9 6.8  RBC 3.88 3.86* 4.48  HGB 10.1* 10.0* 11.5*  HCT 32.4* 32.2* 36.3  MCV 83.5 83.4 81.0  MCH 26.0 25.9* 25.7*  MCHC 31.2 31.1 31.7  RDW 17.0* 16.7* 16.1*  PLT 132* 142* 158   Thyroid  Recent Labs  Lab 02/09/23 0433  TSH 3.566    BNP No results for input(s): "BNP", "PROBNP" in the last 168 hours.   DDimer No results for input(s): "DDIMER" in the last 168 hours.   Radiology    ECHO TEE  Result Date: 02/13/2023    TRANSESOPHOGEAL ECHO REPORT   Patient Name:   [name] Date of Exam: 02/13/2023 Medical Rec #:  [id number]     Height:       65.0 in Accession #:    [id number]    Weight:       224.9 lb Date of Birth:   [birth year]     BSA:          2.079 m Patient Age:    77 years      BP:           115/80 mmHg Patient Gender: F             HR:           120 bpm. Exam Location:  [facility] Procedure: Transesophageal Echo Indications:     atrial fibrillation with RVR, SOB  History:         Patient has prior history of Echocardiogram examinations.                  Arrythmias:Atrial Fibrillation; Signs/Symptoms:Shortness of                  Breath.  Sonographer:     Cristela Blue Referring Phys:  [id number] Sondra Barges Diagnosing Phys: Julien Nordmann MD PROCEDURE: After discussion of the risks and benefits of a TEE, an informed consent was obtained from the patient. TEE procedure time was 30 minutes. The transesophogeal probe was passed without difficulty through the esophogus of the patient. Imaged were obtained with the patient in a left lateral decubitus position. Local oropharyngeal anesthetic was provided with Cetacaine and viscous lidocaine. Sedation performed by different physician. Image quality was good. The patient's vital signs; including  heart rate, blood pressure, and oxygen saturation; remained stable throughout the procedure. The patient developed no complications during the procedure. A successful direct current cardioversion was performed at 150 joules with 1 attempt.  IMPRESSIONS  1. Left ventricular ejection fraction, by estimation, is 50 to 55%. The left ventricle has low normal function. The left ventricle has no regional wall motion abnormalities.  2. Right ventricular systolic  function is low normal. The right ventricular size is normal.  3. Left atrial size was moderately dilated. No left atrial/left atrial appendage thrombus was detected.  4. Right atrial size was moderately dilated.  5. The mitral valve is normal in structure. Moderate mitral valve regurgitation. No evidence of mitral stenosis.  6. The aortic valve is tricuspid. Aortic valve regurgitation is mild to moderate. No aortic stenosis is present.  7. The  inferior vena cava is normal in size with greater than 50% respiratory variability, suggesting right atrial pressure of 3 mmHg.  8. Agitated saline contrast bubble study was positive with shunting observed within 3-6 cardiac cycles suggestive of interatrial shunt. There is a small patent foramen ovale with predominantly right to left shunting across the atrial septum. Conclusion(s)/Recommendation(s): Normal biventricular function without evidence of hemodynamically significant valvular heart disease. FINDINGS  Left Ventricle: Left ventricular ejection fraction, by estimation, is 50 to 55%. The left ventricle has low normal function. The left ventricle has no regional wall motion abnormalities. The left ventricular internal cavity size was normal in size. There is no left ventricular hypertrophy. Right Ventricle: The right ventricular size is normal. No increase in right ventricular wall thickness. Right ventricular systolic function is low normal. Left Atrium: Left atrial size was moderately dilated. No left atrial/left atrial appendage thrombus was detected. Right Atrium: Right atrial size was moderately dilated. Pericardium: There is no evidence of pericardial effusion. Mitral Valve: The mitral valve is normal in structure. Moderate mitral valve regurgitation. No evidence of mitral valve stenosis. Tricuspid Valve: The tricuspid valve is normal in structure. Tricuspid valve regurgitation is mild . No evidence of tricuspid stenosis. Aortic Valve: The aortic valve is tricuspid. Aortic valve regurgitation is mild to moderate. No aortic stenosis is present. Pulmonic Valve: The pulmonic valve was normal in structure. Pulmonic valve regurgitation is not visualized. No evidence of pulmonic stenosis. Aorta: The aortic root is normal in size and structure. Venous: The inferior vena cava is normal in size with greater than 50% respiratory variability, suggesting right atrial pressure of 3 mmHg. IAS/Shunts: No atrial level  shunt detected by color flow Doppler. Agitated saline contrast bubble study was positive with shunting observed within 3-6 cardiac cycles suggestive of interatrial shunt. A small patent foramen ovale is detected with predominantly right to left shunting across the atrial septum. Julien Nordmann MD Electronically signed by Julien Nordmann MD Signature Date/Time: 02/13/2023/1:30:10 PM    Final     Cardiac Studies  Echo   1. Left ventricular ejection fraction, by estimation, is 60 to 65%. The  left ventricle has normal function. The left ventricle has no regional  wall motion abnormalities. There is mild left ventricular hypertrophy.  Left ventricular diastolic parameters  are indeterminate.   2. Right ventricular systolic function is normal. The right ventricular  size is normal. There is mildly elevated pulmonary artery systolic  pressure.   3. Left atrial size was moderately dilated.   4. The mitral valve is normal in structure. Mild to moderate mitral valve  regurgitation. No evidence of mitral stenosis.   5. The aortic valve has an indeterminant number of cusps. Aortic valve  regurgitation is mild to moderate. No aortic stenosis is present.   6. The inferior vena cava is normal in size with greater than 50%  respiratory variability, suggesting right atrial pressure of 3 mmHg.   7. Agitated saline contrast bubble study was negative, with no evidence  of any interatrial shunt.    Patient Profile  Ms. Orena Hinchcliffe is a 77 year old woman with history of overt obesity, seizures, hypothyroidism, mechanical falls, iron deficiency anemia, anxiety/panic attacks, presenting from [facility] assisted facility after a fall Long discussion with her concerning home situation, she reports independent living, some assistance from staff and son.  Periodic falls, legs have been getting weaker.  This admission was transferring from couch to bed and she was not using her walker and went down, trauma to left  lip.  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  For recurrent atrial fibrillation, would get EP involved..  Consideration of Tikosyn Not a good candidate for ablation  Iron deficiency anemia -Would continue to monitor CBC on Eliquis  Hypotension Blood pressure by her report is always low Diltiazem, metoprolol on hold for hypotension   Mechanical falls/leg weakness Working with PT, needs rehab at [facility]   Total encounter time more than 35 minutes  Greater than 50% was spent in counseling and coordination of care with the patient   For questions or updates, please contact [facility] Please consult [email or web address] for contact info under        Signed, Julien Nordmann, MD  02/15/2023, 10:02 AM

## 2023-02-15 NOTE — Progress Notes (Signed)
  Progress Note   Patient: [name] GNF:[id number] DOB: [birth year] DOA: 02/07/2023     7 DOS: the patient was seen and examined on 02/15/2023   Brief hospital course: MALESSA ZARTMAN is a 77 y.o. female with medical history significant for seizures, hypothyroidism, anxiety, depression, cardiac history presented for evaluation of frequent falls, found to have heart rate of 124 was started on Cardizem drip admitted to hospitalist service. Patient is seen by Cardiologist, cardizem gtt transitioned to 60mg  oral q6 hrs, beta blocker titrated, started low dose digoxin. Given CHADs2 score of 3 initiated Eliquis therapy. EP physician performed TEE with cardioversion today with successful return to sinus rhythm. HR 40-80 beta blocker, digoxin held for now.  Assessment and Plan: Afib with RVR- S/p TEE cardioversion. Hypoxia improved. HR 45-55. Digoxin, metoprolol held due to low HR. Cardiology follow up appreciated. Continue Eliquis. Not a candidate for ablation. Telemetry monitoring.  Acute diastolic CHF -mild. Improved symptoms. Continue Lasix 20 mg daily. Continue daily weights, strict input and output.  Recurrent falls- PT/ OT recommended SNF. Continue fall, aspiration precautions.  Aerococcus UTI - finished Keflex therapy  Chronic diarrhea- unclear etiology. Improved.  Continue supportive care. Outpatient GI work up.  Thrombocytopenia - improved.  DVT prophylaxis- Eliquis. CODE STATUS - FULL CODE.  Medically stable to be discharged to SNF, awaiting placement.     Subjective: Patient is seen and examined today morning. She is lying comfortably, denies any complaints. Eating fair. HR 45- 55 noted.  Physical Exam: Vitals:   02/15/23 0700 02/15/23 0746 02/15/23 0800 02/15/23 1205  BP:  (!) 131/49  123/64  Pulse:  (!) 52  (!) 46  Resp: 14 16 13 16   Temp:  98.4 F (36.9 C)  97.9 F (36.6 C)  TempSrc:  Oral    SpO2:  94%  97%  Weight:  99.4 kg    Height:       General -  Elderly obese Caucasian female, no apparent distress noted HEENT - PERRLA, EOMI, atraumatic head, non tender sinuses. Lung - Clear, rales, rhonchi, wheezes. Heart - S1, S2 heard, no murmurs, rubs, trace pedal edema Neuro - Alert, awake and oriented, non focal exam. Skin - Warm and dry.  Data Reviewed:  telemetry  Family Communication: Patient is  Disposition: Status is: Inpatient Remains inpatient appropriate because: Medically ready for discharge, safe dispo plan.  Planned Discharge Destination: Skilled nursing facility    Time spent: 43 minutes  Author: Marcelino Duster, MD 02/15/2023 2:58 PM  For on call review [email or web address].

## 2023-02-16 ENCOUNTER — Inpatient Hospital Stay: Payer: Medicare Other

## 2023-02-16 ENCOUNTER — Inpatient Hospital Stay: Payer: Medicare Other | Admitting: Anesthesiology

## 2023-02-16 ENCOUNTER — Encounter: Admission: EM | Disposition: E | Payer: Self-pay | Source: Home / Self Care | Attending: Internal Medicine

## 2023-02-16 ENCOUNTER — Telehealth: Payer: Self-pay

## 2023-02-16 ENCOUNTER — Other Ambulatory Visit: Payer: Self-pay

## 2023-02-16 ENCOUNTER — Ambulatory Visit: Payer: Medicare Other

## 2023-02-16 DIAGNOSIS — S065XAA Traumatic subdural hemorrhage with loss of consciousness status unknown, initial encounter: Secondary | ICD-10-CM

## 2023-02-16 DIAGNOSIS — G4489 Other headache syndrome: Secondary | ICD-10-CM | POA: Diagnosis not present

## 2023-02-16 DIAGNOSIS — R079 Chest pain, unspecified: Secondary | ICD-10-CM | POA: Diagnosis not present

## 2023-02-16 DIAGNOSIS — S069X0A Unspecified intracranial injury without loss of consciousness, initial encounter: Secondary | ICD-10-CM | POA: Diagnosis not present

## 2023-02-16 DIAGNOSIS — I959 Hypotension, unspecified: Secondary | ICD-10-CM | POA: Diagnosis not present

## 2023-02-16 DIAGNOSIS — N179 Acute kidney failure, unspecified: Secondary | ICD-10-CM | POA: Diagnosis not present

## 2023-02-16 DIAGNOSIS — R29818 Other symptoms and signs involving the nervous system: Secondary | ICD-10-CM | POA: Diagnosis not present

## 2023-02-16 DIAGNOSIS — I509 Heart failure, unspecified: Secondary | ICD-10-CM | POA: Diagnosis not present

## 2023-02-16 DIAGNOSIS — I6201 Nontraumatic acute subdural hemorrhage: Secondary | ICD-10-CM | POA: Diagnosis not present

## 2023-02-16 DIAGNOSIS — R0600 Dyspnea, unspecified: Secondary | ICD-10-CM | POA: Diagnosis not present

## 2023-02-16 DIAGNOSIS — Z4659 Encounter for fitting and adjustment of other gastrointestinal appliance and device: Secondary | ICD-10-CM | POA: Diagnosis not present

## 2023-02-16 DIAGNOSIS — R519 Headache, unspecified: Secondary | ICD-10-CM

## 2023-02-16 DIAGNOSIS — F03918 Unspecified dementia, unspecified severity, with other behavioral disturbance: Secondary | ICD-10-CM | POA: Diagnosis not present

## 2023-02-16 DIAGNOSIS — E876 Hypokalemia: Secondary | ICD-10-CM | POA: Diagnosis not present

## 2023-02-16 DIAGNOSIS — I4891 Unspecified atrial fibrillation: Secondary | ICD-10-CM | POA: Diagnosis not present

## 2023-02-16 DIAGNOSIS — I499 Cardiac arrhythmia, unspecified: Secondary | ICD-10-CM | POA: Diagnosis not present

## 2023-02-16 DIAGNOSIS — N3000 Acute cystitis without hematuria: Secondary | ICD-10-CM | POA: Diagnosis not present

## 2023-02-16 DIAGNOSIS — I6200 Nontraumatic subdural hemorrhage, unspecified: Secondary | ICD-10-CM | POA: Diagnosis not present

## 2023-02-16 HISTORY — PX: CRANIOTOMY: SHX93

## 2023-02-16 LAB — BLOOD GAS, ARTERIAL
Acid-Base Excess: 1.2 mmol/L (ref 0.0–2.0)
Acid-Base Excess: 0 mmol/L (ref 0.0–2.0)
Acid-Base Excess: 0.3 mmol/L (ref 0.0–2.0)
Bicarbonate: 25 mmol/L (ref 20.0–28.0)
Bicarbonate: 23.6 mmol/L (ref 20.0–28.0)
Bicarbonate: 23.5 mmol/L (ref 20.0–28.0)
FIO2: 50 %
FIO2: 50 %
MECHVT: 480 mL
MECHVT: 480 mL
O2 Saturation: 85.1 %
O2 Saturation: 97.4 %
O2 Saturation: 97.6 %
PEEP: 5 cmH2O
PEEP: 5 cmH2O
Patient temperature: 37
Patient temperature: 37
Patient temperature: 37
RATE: 20 resp/min
RATE: 18 resp/min
pCO2 arterial: 36 mmHg (ref 32–48)
pCO2 arterial: 34 mmHg (ref 32–48)
pCO2 arterial: 33 mmHg (ref 32–48)
pH, Arterial: 7.45 (ref 7.35–7.45)
pH, Arterial: 7.45 (ref 7.35–7.45)
pH, Arterial: 7.46 — ABNORMAL HIGH (ref 7.35–7.45)
pO2, Arterial: 55 mmHg — ABNORMAL LOW (ref 83–108)
pO2, Arterial: 179 mmHg — ABNORMAL HIGH (ref 83–108)
pO2, Arterial: 152 mmHg — ABNORMAL HIGH (ref 83–108)

## 2023-02-16 LAB — CBC
HCT: 33 % — ABNORMAL LOW (ref 36.0–46.0)
HCT: 36.1 % (ref 36.0–46.0)
HCT: 31 % — ABNORMAL LOW (ref 36.0–46.0)
Hemoglobin: 10.6 g/dL — ABNORMAL LOW (ref 12.0–15.0)
Hemoglobin: 11.6 g/dL — ABNORMAL LOW (ref 12.0–15.0)
Hemoglobin: 10.1 g/dL — ABNORMAL LOW (ref 12.0–15.0)
MCH: 26.2 pg (ref 26.0–34.0)
MCH: 26 pg (ref 26.0–34.0)
MCH: 26.6 pg (ref 26.0–34.0)
MCHC: 32.1 g/dL (ref 30.0–36.0)
MCHC: 32.1 g/dL (ref 30.0–36.0)
MCHC: 32.6 g/dL (ref 30.0–36.0)
MCV: 81.5 fL (ref 80.0–100.0)
MCV: 80.8 fL (ref 80.0–100.0)
MCV: 81.6 fL (ref 80.0–100.0)
Platelets: 170 10*3/uL (ref 150–400)
Platelets: 227 10*3/uL (ref 150–400)
Platelets: 235 10*3/uL (ref 150–400)
RBC: 4.05 MIL/uL (ref 3.87–5.11)
RBC: 4.47 MIL/uL (ref 3.87–5.11)
RBC: 3.8 MIL/uL — ABNORMAL LOW (ref 3.87–5.11)
RDW: 15.9 % — ABNORMAL HIGH (ref 11.5–15.5)
RDW: 15.9 % — ABNORMAL HIGH (ref 11.5–15.5)
RDW: 15.9 % — ABNORMAL HIGH (ref 11.5–15.5)
WBC: 5.9 10*3/uL (ref 4.0–10.5)
WBC: 15.8 10*3/uL — ABNORMAL HIGH (ref 4.0–10.5)
WBC: 12.6 10*3/uL — ABNORMAL HIGH (ref 4.0–10.5)
nRBC: 0 % (ref 0.0–0.2)
nRBC: 0 % (ref 0.0–0.2)
nRBC: 0 % (ref 0.0–0.2)

## 2023-02-16 LAB — LACTIC ACID, PLASMA
Lactic Acid, Venous: 3.2 mmol/L (ref 0.5–1.9)
Lactic Acid, Venous: 1.9 mmol/L (ref 0.5–1.9)

## 2023-02-16 LAB — BASIC METABOLIC PANEL
Anion gap: 9 (ref 5–15)
BUN: 10 mg/dL (ref 8–23)
CO2: 24 mmol/L (ref 22–32)
Calcium: 7.9 mg/dL — ABNORMAL LOW (ref 8.9–10.3)
Chloride: 97 mmol/L — ABNORMAL LOW (ref 98–111)
Creatinine, Ser: 0.81 mg/dL (ref 0.44–1.00)
GFR, Estimated: >60 mL/min (ref >60)
Glucose, Bld: 161 mg/dL — ABNORMAL HIGH (ref 70–99)
Potassium: 3.8 mmol/L (ref 3.5–5.1)
Sodium: 130 mmol/L — ABNORMAL LOW (ref 135–145)

## 2023-02-16 LAB — COMPREHENSIVE METABOLIC PANEL
ALT: 11 U/L (ref 0–44)
AST: 21 U/L (ref 15–41)
Albumin: 3.3 g/dL — ABNORMAL LOW (ref 3.5–5.0)
Alkaline Phosphatase: 87 U/L (ref 38–126)
Anion gap: 13 (ref 5–15)
BUN: 11 mg/dL (ref 8–23)
CO2: 23 mmol/L (ref 22–32)
Calcium: 8.5 mg/dL — ABNORMAL LOW (ref 8.9–10.3)
Chloride: 94 mmol/L — ABNORMAL LOW (ref 98–111)
Creatinine, Ser: 0.81 mg/dL (ref 0.44–1.00)
GFR, Estimated: >60 mL/min (ref >60)
Glucose, Bld: 203 mg/dL — ABNORMAL HIGH (ref 70–99)
Potassium: 3.6 mmol/L (ref 3.5–5.1)
Sodium: 130 mmol/L — ABNORMAL LOW (ref 135–145)
Total Bilirubin: 0.6 mg/dL (ref 0.3–1.2)
Total Protein: 7.1 g/dL (ref 6.5–8.1)

## 2023-02-16 LAB — MRSA NEXT GEN BY PCR, NASAL: MRSA by PCR Next Gen: DETECTED — AB

## 2023-02-16 LAB — PHOSPHORUS: Phosphorus: 3.9 mg/dL (ref 2.5–4.6)

## 2023-02-16 LAB — GLUCOSE, CAPILLARY
Glucose-Capillary: 199 mg/dL — ABNORMAL HIGH (ref 70–99)
Glucose-Capillary: 170 mg/dL — ABNORMAL HIGH (ref 70–99)
Glucose-Capillary: 187 mg/dL — ABNORMAL HIGH (ref 70–99)
Glucose-Capillary: 117 mg/dL — ABNORMAL HIGH (ref 70–99)

## 2023-02-16 LAB — MAGNESIUM: Magnesium: 1.8 mg/dL (ref 1.7–2.4)

## 2023-02-16 LAB — TROPONIN I (HIGH SENSITIVITY): Troponin I (High Sensitivity): 64 ng/L — ABNORMAL HIGH (ref <18)

## 2023-02-16 LAB — AMMONIA: Ammonia: 31 umol/L (ref 9–35)

## 2023-02-16 SURGERY — CRANIOTOMY HEMATOMA EVACUATION SUBDURAL
Anesthesia: General | Laterality: Left

## 2023-02-16 MED ORDER — FENTANYL CITRATE (PF) 100 MCG/2ML IJ SOLN
INTRAMUSCULAR | Status: AC
  Administered 2023-02-16: 200 ug
  Filled 2023-02-16: qty 4

## 2023-02-16 MED ORDER — IBUPROFEN 400 MG PO TABS
600.0000 mg | ORAL_TABLET | Freq: Once | ORAL | Status: AC
  Administered 2023-02-16: 600 mg via ORAL
  Filled 2023-02-16 (×2): qty 2

## 2023-02-16 MED ORDER — ETOMIDATE 2 MG/ML IV SOLN
20.0000 mg | Freq: Once | INTRAVENOUS | Status: AC
  Administered 2023-02-16: 20 mg via INTRAVENOUS

## 2023-02-16 MED ORDER — MANNITOL 20 % IV SOLN
INTRAVENOUS | Status: AC
  Filled 2023-02-16: qty 500

## 2023-02-16 MED ORDER — FENTANYL CITRATE (PF) 100 MCG/2ML IJ SOLN
INTRAMUSCULAR | Status: DC | PRN
  Administered 2023-02-16 (×2): 25 ug via INTRAVENOUS

## 2023-02-16 MED ORDER — POLYETHYLENE GLYCOL 3350 17 G PO PACK
17.0000 g | PACK | Freq: Every day | ORAL | Status: DC
  Administered 2023-02-17 – 2023-02-19 (×3): 17 g
  Filled 2023-02-16 (×3): qty 1

## 2023-02-16 MED ORDER — LEVETIRACETAM 500 MG/5ML IV SOLN
INTRAVENOUS | Status: AC
  Filled 2023-02-16: qty 5

## 2023-02-16 MED ORDER — FENTANYL BOLUS VIA INFUSION: 25.0000 ug | INTRAVENOUS | Status: DC | PRN

## 2023-02-16 MED ORDER — DOCUSATE SODIUM 50 MG/5ML PO LIQD
100.0000 mg | Freq: Two times a day (BID) | ORAL | Status: DC
  Administered 2023-02-17 – 2023-02-18 (×4): 100 mg
  Filled 2023-02-16 (×4): qty 10

## 2023-02-16 MED ORDER — FENTANYL CITRATE (PF) 100 MCG/2ML IJ SOLN
INTRAMUSCULAR | Status: AC
  Filled 2023-02-16: qty 2

## 2023-02-16 MED ORDER — BACITRACIN ZINC 500 UNIT/GM EX OINT
TOPICAL_OINTMENT | CUTANEOUS | Status: DC | PRN
  Administered 2023-02-16: 1 via TOPICAL

## 2023-02-16 MED ORDER — EMPTY CONTAINERS FLEXIBLE MISC
900.0000 mg | Freq: Once | Status: AC
  Administered 2023-02-16: 900 mg via INTRAVENOUS
  Filled 2023-02-16: qty 90

## 2023-02-16 MED ORDER — SODIUM CHLORIDE 0.9 % IV BOLUS
1000.0000 mL | Freq: Once | INTRAVENOUS | Status: AC
  Administered 2023-02-16: 1000 mL via INTRAVENOUS

## 2023-02-16 MED ORDER — MANNITOL 20 % IV SOLN
0.5000 g/kg | Freq: Once | INTRAVENOUS | Status: DC
  Filled 2023-02-16: qty 250

## 2023-02-16 MED ORDER — ROCURONIUM BROMIDE 100 MG/10ML IV SOLN
INTRAVENOUS | Status: DC | PRN
  Administered 2023-02-16: 40 mg via INTRAVENOUS

## 2023-02-16 MED ORDER — CHLORHEXIDINE GLUCONATE CLOTH 2 % EX PADS
6.0000 | MEDICATED_PAD | Freq: Every day | CUTANEOUS | Status: DC
  Administered 2023-02-17 – 2023-02-19 (×3): 6 via TOPICAL

## 2023-02-16 MED ORDER — LIDOCAINE-EPINEPHRINE 1 %-1:100000 IJ SOLN
INTRAMUSCULAR | Status: AC
  Filled 2023-02-16: qty 1

## 2023-02-16 MED ORDER — INSULIN ASPART 100 UNIT/ML IJ SOLN
0.0000 [IU] | INTRAMUSCULAR | Status: DC
  Administered 2023-02-16: 3 [IU] via SUBCUTANEOUS
  Administered 2023-02-17: 2 [IU] via SUBCUTANEOUS
  Administered 2023-02-18: 3 [IU] via SUBCUTANEOUS
  Administered 2023-02-18 (×2): 2 [IU] via SUBCUTANEOUS
  Filled 2023-02-16 (×5): qty 1

## 2023-02-16 MED ORDER — GELATIN ABSORBABLE 100 CM EX MISC
CUTANEOUS | Status: AC
  Filled 2023-02-16: qty 1

## 2023-02-16 MED ORDER — IOHEXOL 350 MG/ML SOLN
75.0000 mL | Freq: Once | INTRAVENOUS | Status: AC | PRN
  Administered 2023-02-16: 75 mL via INTRAVENOUS

## 2023-02-16 MED ORDER — PANTOPRAZOLE SODIUM 40 MG IV SOLR
40.0000 mg | Freq: Every day | INTRAVENOUS | Status: DC
  Administered 2023-02-16 – 2023-02-19 (×4): 40 mg via INTRAVENOUS
  Filled 2023-02-16 (×4): qty 10

## 2023-02-16 MED ORDER — HEMOSTATIC AGENTS (NO CHARGE) OPTIME
TOPICAL | Status: DC | PRN
  Administered 2023-02-16: 1 via TOPICAL

## 2023-02-16 MED ORDER — SODIUM CHLORIDE 0.9 % IV SOLN: Freq: Once | INTRAVENOUS | Status: DC

## 2023-02-16 MED ORDER — SODIUM CHLORIDE 0.9 % IV SOLN
250.0000 mL | INTRAVENOUS | Status: DC
  Administered 2023-02-16: 250 mL via INTRAVENOUS

## 2023-02-16 MED ORDER — ORAL CARE MOUTH RINSE
15.0000 mL | OROMUCOSAL | Status: DC
  Administered 2023-02-16 – 2023-02-19 (×34): 15 mL via OROMUCOSAL

## 2023-02-16 MED ORDER — ROCURONIUM BROMIDE 10 MG/ML (PF) SYRINGE
50.0000 mg | PREFILLED_SYRINGE | Freq: Once | INTRAVENOUS | Status: AC
  Administered 2023-02-16: 50 mg via INTRAVENOUS
  Filled 2023-02-16: qty 5

## 2023-02-16 MED ORDER — SODIUM CHLORIDE 0.9 % IV SOLN: INTRAVENOUS | Status: DC | PRN

## 2023-02-16 MED ORDER — PROPOFOL 500 MG/50ML IV EMUL
INTRAVENOUS | Status: DC | PRN
  Administered 2023-02-16: 50 ug/kg/min via INTRAVENOUS

## 2023-02-16 MED ORDER — LIDOCAINE-EPINEPHRINE 1 %-1:100000 IJ SOLN
INTRAMUSCULAR | Status: DC | PRN
  Administered 2023-02-16: 10 mL

## 2023-02-16 MED ORDER — PROPOFOL 10 MG/ML IV BOLUS
INTRAVENOUS | Status: DC | PRN
  Administered 2023-02-16: 40 mg via INTRAVENOUS

## 2023-02-16 MED ORDER — PROPOFOL 1000 MG/100ML IV EMUL
5.0000 ug/kg/min | INTRAVENOUS | Status: DC
  Administered 2023-02-16: 15 ug/kg/min via INTRAVENOUS
  Administered 2023-02-17: 40 ug/kg/min via INTRAVENOUS
  Administered 2023-02-17: 25 ug/kg/min via INTRAVENOUS
  Administered 2023-02-18 (×3): 40 ug/kg/min via INTRAVENOUS
  Administered 2023-02-19: 45 ug/kg/min via INTRAVENOUS
  Administered 2023-02-19 (×2): 40 ug/kg/min via INTRAVENOUS
  Filled 2023-02-16 (×11): qty 100

## 2023-02-16 MED ORDER — MIDAZOLAM HCL 2 MG/2ML IJ SOLN: 1.0000 mg | INTRAMUSCULAR | Status: DC | PRN

## 2023-02-16 MED ORDER — BACITRACIN ZINC 500 UNIT/GM EX OINT
TOPICAL_OINTMENT | CUTANEOUS | Status: AC
  Filled 2023-02-16: qty 28.35

## 2023-02-16 MED ORDER — ORAL CARE MOUTH RINSE: 15.0000 mL | OROMUCOSAL | Status: DC | PRN

## 2023-02-16 MED ORDER — FENTANYL CITRATE PF 50 MCG/ML IJ SOSY: 100.0000 ug | PREFILLED_SYRINGE | Freq: Once | INTRAMUSCULAR | Status: DC

## 2023-02-16 MED ORDER — IOHEXOL 350 MG/ML SOLN: 75.0000 mL | Freq: Once | INTRAVENOUS | Status: DC | PRN

## 2023-02-16 MED ORDER — OXYCODONE HCL 5 MG PO TABS
5.0000 mg | ORAL_TABLET | ORAL | Status: DC | PRN
  Administered 2023-02-16: 5 mg via ORAL
  Filled 2023-02-16: qty 1

## 2023-02-16 MED ORDER — CEFAZOLIN SODIUM 1 G IJ SOLR
INTRAMUSCULAR | Status: AC
  Filled 2023-02-16: qty 20

## 2023-02-16 MED ORDER — PROPOFOL 1000 MG/100ML IV EMUL
INTRAVENOUS | Status: AC
  Filled 2023-02-16: qty 100

## 2023-02-16 MED ORDER — CEFAZOLIN SODIUM-DEXTROSE 2-4 GM/100ML-% IV SOLN
2.0000 g | Freq: Three times a day (TID) | INTRAVENOUS | Status: DC
  Administered 2023-02-17 – 2023-02-19 (×9): 2 g via INTRAVENOUS
  Filled 2023-02-16 (×9): qty 100

## 2023-02-16 MED ORDER — FENTANYL 2500MCG IN NS 250ML (10MCG/ML) PREMIX INFUSION
0.0000 ug/h | INTRAVENOUS | Status: DC
  Administered 2023-02-16 – 2023-02-18 (×3): 100 ug/h via INTRAVENOUS
  Filled 2023-02-16 (×3): qty 250

## 2023-02-16 MED ORDER — NOREPINEPHRINE 4 MG/250ML-% IV SOLN
INTRAVENOUS | Status: AC
  Administered 2023-02-16: 2 mg
  Filled 2023-02-16: qty 250

## 2023-02-16 MED ORDER — CEFAZOLIN SODIUM-DEXTROSE 2-3 GM-%(50ML) IV SOLR
INTRAVENOUS | Status: DC | PRN
  Administered 2023-02-16: 2 g via INTRAVENOUS

## 2023-02-16 MED ORDER — SEVOFLURANE IN SOLN
RESPIRATORY_TRACT | Status: AC
  Filled 2023-02-16: qty 250

## 2023-02-16 MED ORDER — SURGIFLO WITH THROMBIN (HEMOSTATIC MATRIX KIT) OPTIME
TOPICAL | Status: DC | PRN
  Administered 2023-02-16 (×2): 1 via TOPICAL

## 2023-02-16 MED ORDER — HYDROCORTISONE 1 % EX CREA
1.0000 | TOPICAL_CREAM | Freq: Three times a day (TID) | CUTANEOUS | Status: DC | PRN
  Filled 2023-02-16: qty 28

## 2023-02-16 MED ORDER — NOREPINEPHRINE 4 MG/250ML-% IV SOLN
2.0000 ug/min | INTRAVENOUS | Status: DC
  Administered 2023-02-17: 8 ug/min via INTRAVENOUS
  Administered 2023-02-17: 4 ug/min via INTRAVENOUS
  Administered 2023-02-18: 10 ug/min via INTRAVENOUS
  Administered 2023-02-18: 8 ug/min via INTRAVENOUS
  Administered 2023-02-18: 7 ug/min via INTRAVENOUS
  Administered 2023-02-19: 8 ug/min via INTRAVENOUS
  Filled 2023-02-16 (×7): qty 250

## 2023-02-16 MED ORDER — SODIUM CHLORIDE 0.9 % IV SOLN
INTRAVENOUS | Status: DC | PRN
  Administered 2023-02-16: 500 mg via INTRAVENOUS

## 2023-02-16 MED ORDER — 0.9 % SODIUM CHLORIDE (POUR BTL) OPTIME
TOPICAL | Status: DC | PRN
  Administered 2023-02-16: 2000 mL
  Administered 2023-02-16: 1000 mL

## 2023-02-16 SURGICAL SUPPLY — 59 items
AGENT HMST KT MTR STRL THRMB (HEMOSTASIS) ×4
BASIN GRAD PLASTIC 32OZ STRL (MISCELLANEOUS) ×2 IMPLANT
BLADE CLIPPER SPEC (BLADE) ×2 IMPLANT
BLADE SURG 15 STRL LF DISP TIS (BLADE) ×2 IMPLANT
BLADE SURG 15 STRL SS (BLADE) ×2
BUR SPIRAL ROUTER 2.3 (BUR) ×2 IMPLANT
CNTNR URN SCR LID CUP LEK RST (MISCELLANEOUS) ×6 IMPLANT
CONT SPEC 4OZ STRL OR WHT (MISCELLANEOUS) ×6
COUNTER NDL MAGNETIC 40 RED (SET/KITS/TRAYS/PACK) IMPLANT
COUNTER NEEDLE 20/40 LG (NEEDLE) ×2 IMPLANT
COUNTER NEEDLE MAGNETIC 40 RED (SET/KITS/TRAYS/PACK) ×2 IMPLANT
DRAPE INCISE IOBAN 66X45 STRL (DRAPES) ×2 IMPLANT
DRAPE SURG 17X11 SM STRL (DRAPES) ×8 IMPLANT
DRAPE WARM FLUID 44X44 (DRAPES) ×2 IMPLANT
DRSG TELFA 3X8 NADH STRL (GAUZE/BANDAGES/DRESSINGS) IMPLANT
ELECT CAUTERY BLADE TIP 2.5 (TIP) ×2
ELECT REM PT RETURN 9FT ADLT (ELECTROSURGICAL) ×2
ELECTRODE CAUTERY BLDE TIP 2.5 (TIP) ×2 IMPLANT
ELECTRODE REM PT RTRN 9FT ADLT (ELECTROSURGICAL) ×2 IMPLANT
GAUZE 4X4 16PLY ~~LOC~~+RFID DBL (SPONGE) ×6 IMPLANT
GLOVE BIOGEL PI IND STRL 6.5 (GLOVE) ×2 IMPLANT
GLOVE SURG SYN 6.5 ES PF (GLOVE) ×2 IMPLANT
GLOVE SURG SYN 6.5 PF PI (GLOVE) ×2 IMPLANT
GLOVE SURG SYN 7.5 E (GLOVE) ×2 IMPLANT
GLOVE SURG SYN 7.5 PF PI (GLOVE) IMPLANT
GOWN SRG LRG LVL 4 IMPRV REINF (GOWNS) ×2 IMPLANT
GOWN SRG XL LVL 3 NONREINFORCE (GOWNS) ×2 IMPLANT
GOWN STRL NON-REIN TWL XL LVL3 (GOWNS) ×2
GOWN STRL REIN LRG LVL4 (GOWNS) ×2
HEMOSTAT SURGICEL 4X8 (HEMOSTASIS) IMPLANT
JACKSON PRATT 7MM (INSTRUMENTS) IMPLANT
KIT TURNOVER KIT A (KITS) ×2 IMPLANT
MANIFOLD NEPTUNE II (INSTRUMENTS) ×2 IMPLANT
MARKER SKIN DUAL TIP RULER LAB (MISCELLANEOUS) ×4 IMPLANT
MAT ABSORB FLUID 56X50 GRAY (MISCELLANEOUS) ×2 IMPLANT
NDL HYPO 22X1.5 SAFETY MO (MISCELLANEOUS) ×2 IMPLANT
NEEDLE HYPO 22X1.5 SAFETY MO (MISCELLANEOUS) ×2 IMPLANT
PACK CRANIOTOMY CUSTOM (CUSTOM PROCEDURE TRAY) ×2 IMPLANT
PAD ARMBOARD 7.5X6 YLW CONV (MISCELLANEOUS) ×4 IMPLANT
PERFORATOR LRG 14-11MM (BIT) IMPLANT
PLATE 1.5/0.5 18.5MM BURR HOLE (Plate) IMPLANT
SCREW SELF DRILL HT 1.5/4MM (Screw) ×24 IMPLANT
SCREW TI CRANI 1.5X4 (Screw) IMPLANT
SHEET NEURO XL SOL CTL (MISCELLANEOUS) ×2 IMPLANT
SOL PREP PVP 2OZ (MISCELLANEOUS) ×2
SOL SCRUB PVP POV-IOD 4OZ 7.5% (MISCELLANEOUS) ×2
SOLUTION PREP PVP 2OZ (MISCELLANEOUS) ×2 IMPLANT
SOLUTION SCRB POV-IOD 4OZ 7.5% (MISCELLANEOUS) ×2 IMPLANT
STAPLER SKIN PROX 35W (STAPLE) ×4 IMPLANT
SURGIFLO W/THROMBIN 8M KIT (HEMOSTASIS) ×4 IMPLANT
SURGILUBE 2OZ TUBE FLIPTOP (MISCELLANEOUS) ×2 IMPLANT
SUT NURALON 4 0 TR CR/8 (SUTURE) ×2 IMPLANT
SUT VIC AB 2-0 CT1 18 (SUTURE) ×4 IMPLANT
SUT VICRYL 3-0 CR8 SH (SUTURE) IMPLANT
SYR 10ML LL (SYRINGE) ×2 IMPLANT
SYR 20ML LL LF (SYRINGE) ×4 IMPLANT
TOWEL OR 17X26 4PK STRL BLUE (TOWEL DISPOSABLE) ×8 IMPLANT
TRAP FLUID SMOKE EVACUATOR (MISCELLANEOUS) ×2 IMPLANT
WATER STERILE IRR 500ML POUR (IV SOLUTION) ×2 IMPLANT

## 2023-02-16 NOTE — Significant Event (Signed)
Rapid Response Event Note   Reason for Call : called for code stroke   Initial Focused Assessment: will wake and mumble, previous c/o headache, last known well 1159 reported by floor, irregular breathing pattern noted      Interventions: back to CT, Lindzen at bedside... Stat transfer to ICU, neurosurgery consult stat, and immanent intubation... hospitalist reported off to Intensivist.    Plan of Care: as above, awaiting neurosurgery consult    Event Summary: as above  MD Notified: Dr Clide Dales Dr Otelia Limes  Call Time:1308 Arrival Time:1310 End [id number]  Crystal Ellwood A, RN

## 2023-02-16 NOTE — Consult Note (Signed)
Consulting Department: Intensive care unit  Primary Physician:  Smiley Houseman, NP  Chief Complaint: Left-sided acute subdural hematoma  History of Present Illness: 02/16/2023 [name] is a 77 y.o. female who presents with the chief complaint of left-sided acute subdural hematoma.  She was doing well until earlier this afternoon had an acute change in her mental status.  Notably she was admitted for A-fib with RVR and she was being anticoagulated for treatment.  She had an acute decline.  CT scan was performed.  Demonstrated a large left-sided subdural hematoma.  CTA was negative for any aneurysms.  She had reversal and was given mannitol.  Review of Systems:  A 10 point review of systems is negative, except for the pertinent positives and negatives detailed in the HPI.  Past Medical History: Past Medical History:  Diagnosis Date   Anemia    took Fe- orally 2 yrs. ago    Anxiety    panic attacks    Arthritis    shoulders, knees   Cancer (HCC)    skin- preCA   Depression    GERD (gastroesophageal reflux disease)    uses Protonix as needed    Headache(784.0)    Humerus fracture    Right    Hyposmolality and/or hyponatremia 02/27/2013   Hypothyroidism    IBS (irritable bowel syndrome)    Injury of left foot 09/05/2018   Injury of right hand 04/27/2014   Memory loss    Mental disorder    Migraine    Preoperative examination 02/26/2013   Seizures (HCC)    caused by depression , seritonin seizure - effected short term memory     Shortness of breath     Past Surgical History: Past Surgical History:  Procedure Laterality Date   ABDOMINAL HYSTERECTOMY     APPENDECTOMY     BACK SURGERY  1990's   BREAST SURGERY  1972   augmentation    CARDIOVERSION N/A 02/13/2023   Procedure: CARDIOVERSION;  Surgeon: Antonieta Iba, MD;  Location: [facility] ORS;  Service: Cardiovascular;  Laterality: N/A;   CHOLECYSTECTOMY     GASTRECTOMY  1994   Due to ulcer, required multiple  revisions.    HERNIA REPAIR     ORIF ANKLE FRACTURE Right 2004   REPLACEMENT TOTAL KNEE     REVERSE SHOULDER ARTHROPLASTY Right 05/16/2013   Procedure: RIGHT HEMI ARTHROPLASTY  VERSES  REVERSE TOTAL SHOULDER ARTHROPLASTY ;  Surgeon: Verlee Rossetti, MD;  Location: [facility] OR;  Service: Orthopedics;  Laterality: Right;   SPINE SURGERY  2005   Lumbar spinal stenosis    STOMACH SURGERY     1/3 resection for obstruction   TEE WITHOUT CARDIOVERSION N/A 02/13/2023   Procedure: TRANSESOPHAGEAL ECHOCARDIOGRAM (TEE);  Surgeon: Antonieta Iba, MD;  Location: [facility] ORS;  Service: Cardiovascular;  Laterality: N/A;   TONSILLECTOMY     TUBAL LIGATION      Allergies: Allergies as of 02/07/2023 - Unable to Assess 02/07/2023  Allergen Reaction Noted   Bupropion hcl Other (See Comments) 04/15/2009   Other  07/02/2020   Penicillins  05/27/2021   Tilactase Diarrhea 05/08/2013   Azithromycin Rash 04/15/2009   Erythromycin Rash 04/15/2009    Medications:  Current Facility-Administered Medications:    0.9 %  sodium chloride infusion, 250 mL, Intravenous, Continuous, Harlon Ditty D, NP   acetaminophen (TYLENOL) tablet 650 mg, 650 mg, Oral, Q6H PRN, 650 mg at 02/14/23 2329 **OR** acetaminophen (TYLENOL) suppository 650 mg, 650 mg, Rectal, Q6H PRN,  Gertha Calkin, MD   [START ON 02/17/2023] Chlorhexidine Gluconate Cloth 2 % PADS 6 each, 6 each, Topical, Q0600, Sreeram, Narendranath, MD   cholecalciferol (VITAMIN D3) 25 MCG (1000 UNIT) tablet 2,000 Units, 2,000 Units, Oral, Daily, Pudota, Elsie Ra, MD, 2,000 Units at 02/16/23 8295   coag fact Xa recombinant (ANDEXXA) low dose infusion 900 mg, 900 mg, Intravenous, Once, Caryl Pina, MD   divalproex (DEPAKOTE) DR tablet 125 mg, 125 mg, Oral, BID, Pudota, Elsie Ra, MD, 125 mg at 02/16/23 0840   docusate (COLACE) 50 MG/5ML liquid 100 mg, 100 mg, Per Tube, BID, Harlon Ditty D, NP   fentaNYL (SUBLIMAZE) bolus via infusion 25-100 mcg, 25-100 mcg,  Intravenous, Q15 min PRN, Harlon Ditty D, NP   fentaNYL (SUBLIMAZE) injection 100 mcg, 100 mcg, Intravenous, Once, Harlon Ditty D, NP   fentaNYL in NS (88mcg/ml) infusion-PREMIX, 0-400 mcg/hr, Intravenous, Continuous, Karna Christmas, Fuad, MD, Last Rate: 10 mL/hr at 02/16/23 1415, 100 mcg/hr at 02/16/23 1415   furosemide (LASIX) tablet 20 mg, 20 mg, Oral, Daily, Arida, Muhammad A, MD, 20 mg at 02/16/23 6213   hydrocortisone cream 1 % 1 Application, 1 Application, Topical, TID PRN, Marcelino Duster, MD   hyoscyamine (ANASPAZ) disintergrating tablet 0.125 mg, 0.125 mg, Oral, BID, Nazari, Walid A, RPH, 0.125 mg at 02/16/23 0840   iohexol (OMNIPAQUE) 350 MG/ML injection 75 mL, 75 mL, Intravenous, Once PRN, Caryl Pina, MD   levothyroxine (SYNTHROID) tablet 100 mcg, 100 mcg, Oral, Daily, Pudota, Elsie Ra, MD, 100 mcg at 02/16/23 0526   lidocaine (LIDODERM) 5 % 1 patch, 1 patch, Transdermal, Daily, Pudota, Elsie Ra, MD, 1 patch at 02/13/23 0846   mannitol 20 % infusion 50 g, 0.5 g/kg, Intravenous, Once, Caryl Pina, MD   midazolam (VERSED) injection 1-2 mg, 1-2 mg, Intravenous, Q1H PRN, Harlon Ditty D, NP   norepinephrine (LEVOPHED) 4mg  in (0.016 mg/mL) premix infusion, 2-10 mcg/min, Intravenous, Titrated, Harlon Ditty D, NP, 2 mg at 02/16/23 1419   Oral care mouth rinse, 15 mL, Mouth Rinse, PRN, Marcelino Duster, MD   Oral care mouth rinse, 15 mL, Mouth Rinse, Q2H, Aleskerov, Fuad, MD   Oral care mouth rinse, 15 mL, Mouth Rinse, PRN, Karna Christmas, Fuad, MD   oxyCODONE (Oxy IR/ROXICODONE) immediate release tablet 10 mg, 10 mg, Oral, Q8H, Pudota, Elsie Ra, MD, 10 mg at 02/16/23 0865   oxyCODONE (Oxy IR/ROXICODONE) immediate release tablet 5 mg, 5 mg, Oral, Q4H PRN, Marcelino Duster, MD, 5 mg at 02/16/23 1159   pantoprazole (PROTONIX) injection 40 mg, 40 mg, Intravenous, Daily, Harlon Ditty D, NP   polyethylene glycol (MIRALAX / GLYCOLAX) packet 17 g, 17  g, Per Tube, Daily, Harlon Ditty D, NP   pregabalin (LYRICA) capsule 100 mg, 100 mg, Oral, BID, Pudota, Kingsley P, MD, 100 mg at 02/16/23 0840   QUEtiapine (SEROQUEL XR) 24 hr tablet 50 mg, 50 mg, Oral, QHS, Pudota, Kingsley P, MD, 50 mg at 02/15/23 2147   ramelteon (ROZEREM) tablet 8 mg, 8 mg, Oral, QHS, Pudota, Kingsley P, MD, 8 mg at 02/15/23 2147   SUMAtriptan (IMITREX) tablet 25 mg, 25 mg, Oral, Q2H PRN, Wouk, Wilfred Curtis, MD, 25 mg at 02/16/23 0841   traZODone (DESYREL) tablet 50-150 mg, 50-150 mg, Oral, QHS PRN, Quincy Sheehan, Elsie Ra, MD, 50 mg at 02/15/23 2147   vortioxetine HBr (TRINTELLIX) tablet 20 mg, 20 mg, Oral, Daily, Pudota, Elsie Ra, MD, 20 mg at 02/16/23 7846   Social History: Social History   Tobacco Use  Smoking status: Never   Smokeless tobacco: Never  Vaping Use   Vaping Use: Never used  Substance Use Topics   Alcohol use: Yes    Comment: rare use of white wine    Drug use: No    Family Medical History: Family History  Problem Relation Age of Onset   Aneurysm Mother    Heart disease Mother    Cancer Father        pancreatic   Stroke Father 46   Hypertension Father    Alcohol abuse Father    Heart disease Maternal Grandfather     Physical Examination: Vitals:   02/16/23 0932 02/16/23 1350  BP: (!) 147/74 101/87  Pulse: 60   Resp: 16 19  Temp: 98 F (36.7 C)   SpO2: 92% 94%     NEUROLOGICAL:  General: Intubated and sedated. Just recently intubated and sedated.  Prior to intubation was waking up on moving and manipulation.  Prior to her acute decline was conversant and without clear deficit.  Pupils are reactive.  Corneals are reactive.  Currently on the ventilator.  GCS: Currently 3T, recently intubated.   Bilateral upper and lower extremity sensation is intact to light touch.  Imaging: Large left-sided mixed density subdural hematoma with significant left to right midline shift with subfalcine and uncal herniation noted.  Some  expansion of her contralateral ventricular system.  Compared to her prior CT scan this is new from 9 days ago.    I have personally reviewed the images and agree with the above interpretation.  Labs:    Latest Ref Rng & Units 02/16/2023   12:41 PM 02/16/2023    6:59 AM 02/12/2023    5:36 AM  CBC  WBC 4.0 - 10.5 K/uL 15.8  5.9  6.8   Hemoglobin 12.0 - 15.0 g/dL 16.1  09.6  04.5   Hematocrit 36.0 - 46.0 % 36.1  33.0  36.3   Platelets 150 - 400 K/uL 227  170  158        Assessment and Plan: [name] is a pleasant 77 y.o. female with a acute left-sided subdural hematoma with alteration in her mental status.  She was previously awake and conversant.  Has had an acute decline with evidence of a subdural hematoma in the setting of Eliquis for A-fib and RVR with a recent hospitalization/current hospitalization.  We have discussed her guarded outcome with the family has this is quite an extensive bleed and she does have significant compression of her brain and brainstem.  We are unable to know the full extent of her current neurologic injuries.  She would qualify for a decompressive craniotomy with subdural hematoma evacuation for decompression of her brain, however this would need to be decided in view of her goals of care as she has an advanced age and  does have some prior comorbid issues.  I have discussed the condition with the patient family, including showing the radiographs and discussing treatment options in layman's terms.  The decision was chose to go forward with surgical decompression and evacuation. We discussed that there may be need for further decompression if the bleed re accumulates in addition to all other surgical risk factors.    Lovenia Kim, MD/MSCR Dept. of Neurosurgery

## 2023-02-16 NOTE — Procedures (Signed)
Central Venous Catheter Insertion Procedure Note  TRITIA VALENTE  [id number]  1946/07/28  Date:02/16/23  Time:2:49 PM   Provider Performing:Leondro Coryell D Elvina Sidle   Procedure: Insertion of Non-tunneled Central Venous Catheter(36556) with US guidance (40981)   Indication(s) Medication administration and Difficult access  Consent Unable to obtain consent due to emergent nature of procedure.  Anesthesia Topical only with 1% lidocaine   Timeout Verified patient identification, verified procedure, site/side was marked, verified correct patient position, special equipment/implants available, medications/allergies/relevant history reviewed, required imaging and test results available.  Sterile Technique Maximal sterile technique including full sterile barrier drape, hand hygiene, sterile gown, sterile gloves, mask, hair covering, sterile ultrasound probe cover (if used).  Procedure Description Area of catheter insertion was cleaned with chlorhexidine and draped in sterile fashion.  With real-time ultrasound guidance a central venous catheter was placed into the left internal jugular vein. Nonpulsatile blood flow and easy flushing noted in all ports.  The catheter was sutured in place and sterile dressing applied.  Complications/Tolerance None; patient tolerated the procedure well. Chest X-ray is ordered to verify placement for internal jugular or subclavian cannulation.   Chest x-ray is not ordered for femoral cannulation.  EBL Minimal  Specimen(s) None   Line secured at the 20 cm mark. BIOPATCH applied to the insertion site.    Harlon Ditty, AGACNP-BC [facility] Prefer epic messenger for cross cover needs If after hours, please call E-link

## 2023-02-16 NOTE — Progress Notes (Signed)
Patient transported to CT and back to ICU on ventilator without incident.

## 2023-02-16 NOTE — Plan of Care (Signed)
Continuing with plan of care. 

## 2023-02-16 NOTE — Significant Event (Signed)
Spoke with Bary Richard 2A charge RN. She stated she was in CT with a patient and needed to have a code stroke called. She had been discussing pt situation with provider and was told they would get neurosurgery involved. Spoke with [facility] operator and requested a code stroke be paged out. Pt was in CT 1, going back to room 260. Code stroke came across pager at 1310/

## 2023-02-16 NOTE — Telephone Encounter (Signed)
No PA required (Medicare and T[employer]are for Life). Scan needs to be done around 03/19/23-03/23/23 Patty, can you make sure this is scheduled once she is discharged from the hospital and that they are aware of both post op appointments? Thanks!

## 2023-02-16 NOTE — Progress Notes (Signed)
  Progress Note   Patient: [name] ZOX:[id number] DOB: [birth year] DOA: 02/07/2023     8 DOS: the patient was seen and examined on 02/16/2023   Brief hospital course: [name] is a 77 y.o. female with medical history significant for seizures, hypothyroidism, anxiety, depression, cardiac history presented for evaluation of frequent falls, found to have heart rate of 124 was started on Cardizem drip admitted to hospitalist service. Patient is seen by Cardiologist, cardizem gtt transitioned to 60mg  oral q6 hrs, beta blocker titrated, started low dose digoxin. Given CHADs2 score of 3 initiated Eliquis therapy. EP physician performed TEE with cardioversion today with successful return to sinus rhythm. HR 40-80 beta blocker, digoxin held for now.  Assessment and Plan: Afib with RVR- S/p TEE cardioversion. Hypoxia improved. HR 45-60. Digoxin, metoprolol held due to low HR. Cardiology follow up appreciated. Continue Eliquis. Not a candidate for ablation. Telemetry monitoring.  Acute diastolic CHF -mild. Improved symptoms. Continue Lasix 20 mg daily. Continue daily weights, strict input and output.  Recurrent falls- PT/ OT recommended SNF. Continue fall, aspiration precautions.  Aerococcus UTI - finished Keflex therapy  Chronic diarrhea- unclear etiology. Improved.  Continue supportive care. Outpatient GI work up.  Thrombocytopenia - improved.  Severe headache- H/o migraines Not relieved with motrin, Oxy home dose. Oxy 5mg  for breakthru ordered. Check CBC, CMP, mg, phos, CT head.  DVT prophylaxis- Eliquis. CODE STATUS - FULL CODE.  Medically stable to be discharged to SNF, awaiting placement.     Subjective: Patient is seen and examined today morning. States she has severe pain this morning. Also has been having pain since 3 days, did not report to RN. In distress right now and asks for opiate medications.  Physical Exam: Vitals:   02/15/23 1938 02/15/23 2353 02/16/23  0334 02/16/23 0932  BP: (!) 117/101 110/88 126/61 (!) 147/74  Pulse: 63 60 (!) 55 60  Resp: 18 18 18 16   Temp: 98.8 F (37.1 C) 98.5 F (36.9 C) 98.6 F (37 C) 98 F (36.7 C)  TempSrc: Oral Oral    SpO2: 98% 97% 91% 92%  Weight:      Height:       General - Elderly obese Caucasian female, in distress due to headache HEENT - PERRLA, EOMI, atraumatic head, non tender sinuses. Lung - Clear, rales, rhonchi, wheezes. Heart - S1, S2 heard, no murmurs, rubs, trace pedal edema Neuro - Alert, awake and oriented, non focal exam. Skin - Warm and dry.  Data Reviewed:  telemetry  Family Communication: Patient is  Disposition: Status is: Inpatient Remains inpatient appropriate because: severe headache need work up including CT head..  Planned Discharge Destination: Skilled nursing facility    Time spent: 43 minutes  Author: Marcelino Duster, MD 02/16/2023 12:18 PM  For on call review [email or web address].

## 2023-02-16 NOTE — Progress Notes (Addendum)
Rounding Note    Patient Name: [name] Date of Encounter: 02/16/2023  [facility] Cardiologist: [facility]  Subjective   Headache this morning Reports having this for 3 days, getting worse Has been receiving around-the-clock narcotics She reports when she had a similar headache at home, takes pain medication, sometimes 2 Symptoms of headache date back to age 77  Telemetry reviewed, heart rates 50-60 normal sinus rhythm  Inpatient Medications    Scheduled Meds:  apixaban  5 mg Oral BID   cholecalciferol  2,000 Units Oral Daily   divalproex  125 mg Oral BID   furosemide  20 mg Oral Daily   hyoscyamine  0.125 mg Oral BID   levothyroxine  100 mcg Oral Daily   lidocaine  1 patch Transdermal Daily   oxyCODONE  10 mg Oral Q8H   pantoprazole  40 mg Oral Daily   pregabalin  100 mg Oral BID   QUEtiapine  50 mg Oral QHS   ramelteon  8 mg Oral QHS   vortioxetine HBr  20 mg Oral Daily   Continuous Infusions:   PRN Meds: acetaminophen **OR** acetaminophen, hydrocortisone cream, mouth rinse, oxyCODONE, SUMAtriptan, traZODone   Vital Signs    Vitals:   02/15/23 1938 02/15/23 2353 02/16/23 0334 02/16/23 0932  BP: (!) 117/101 110/88 126/61 (!) 147/74  Pulse: 63 60 (!) 55 60  Resp: 18 18 18 16   Temp: 98.8 F (37.1 C) 98.5 F (36.9 C) 98.6 F (37 C) 98 F (36.7 C)  TempSrc: Oral Oral    SpO2: 98% 97% 91% 92%  Weight:      Height:        Intake/Output Summary (Last 24 hours) at 02/16/2023 1153 Last data filed at 02/16/2023 1044 Gross per 24 hour  Intake 720 ml  Output 3100 ml  Net -2380 ml      02/15/2023    7:46 AM 02/07/2023    7:12 PM 02/01/2023    2:57 PM  Last 3 Weights  Weight (lbs) 219 lb 2.2 oz 224 lb 13.9 oz 224 lb 13.9 oz  Weight (kg) 99.4 kg 102 kg 102 kg      Telemetry    Normal sinus rhythm rate 50-60 personally Reviewed  ECG     - Personally Reviewed  Physical Exam   Constitutional:  oriented to person, place, and time.   Moderate distress secondary to headache HENT:  Head: Grossly normal Eyes:  no discharge. No scleral icterus.  Neck: No JVD, no carotid bruits  Cardiovascular: Regular rate and rhythm, no murmurs appreciated Pulmonary/Chest: Clear to auscultation bilaterally, no wheezes or rails Abdominal: Soft.  no distension.  no tenderness.  Musculoskeletal: Normal range of motion Neurological:  normal muscle tone. Coordination normal. No atrophy Skin: Skin warm and dry Psychiatric: normal affect, pleasant   Labs    High Sensitivity Troponin:   Recent Labs  Lab 02/07/23 2227 02/08/23 0047  TROPONINIHS 9 10     Chemistry Recent Labs  Lab 02/12/23 0536 02/13/23 0622 02/14/23 0342  NA 135 134* 133*  K 4.3 4.0 4.1  CL 100 99 100  CO2 28 26 25   GLUCOSE 93 103* 93  BUN 14 13 13   CREATININE 0.82 0.70 0.73  CALCIUM 8.4* 7.9* 8.1*  GFRNONAA >60 >60 >60  ANIONGAP 7 9 8     Lipids No results for input(s): "CHOL", "TRIG", "HDL", "LABVLDL", "LDLCALC", "CHOLHDL" in the last 168 hours.  Hematology Recent Labs  Lab 02/11/23 0533 02/12/23 0536 02/16/23  0659  WBC 5.9 6.8 5.9  RBC 3.86* 4.48 4.05  HGB 10.0* 11.5* 10.6*  HCT 32.2* 36.3 33.0*  MCV 83.4 81.0 81.5  MCH 25.9* 25.7* 26.2  MCHC 31.1 31.7 32.1  RDW 16.7* 16.1* 15.9*  PLT 142* 158 170   Thyroid  No results for input(s): "TSH", "FREET4" in the last 168 hours.   BNP No results for input(s): "BNP", "PROBNP" in the last 168 hours.   DDimer No results for input(s): "DDIMER" in the last 168 hours.   Radiology    No results found.  Cardiac Studies  Echo   1. Left ventricular ejection fraction, by estimation, is 60 to 65%. The  left ventricle has normal function. The left ventricle has no regional  wall motion abnormalities. There is mild left ventricular hypertrophy.  Left ventricular diastolic parameters  are indeterminate.   2. Right ventricular systolic function is normal. The right ventricular  size is normal. There is  mildly elevated pulmonary artery systolic  pressure.   3. Left atrial size was moderately dilated.   4. The mitral valve is normal in structure. Mild to moderate mitral valve  regurgitation. No evidence of mitral stenosis.   5. The aortic valve has an indeterminant number of cusps. Aortic valve  regurgitation is mild to moderate. No aortic stenosis is present.   6. The inferior vena cava is normal in size with greater than 50%  respiratory variability, suggesting right atrial pressure of 3 mmHg.   7. Agitated saline contrast bubble study was negative, with no evidence  of any interatrial shunt.    Patient Profile     [name] is a 77 year old woman with history of overt obesity, seizures, hypothyroidism, mechanical falls, iron deficiency anemia, anxiety/panic attacks, presenting from [facility] assisted facility after a fall Long discussion with her concerning home situation, she reports independent living, some assistance from staff and son.  Periodic falls, legs have been getting weaker.  This admission was transferring from couch to bed and she was not using her walker and went down, trauma to left lip.  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 CT chest abdomen pelvis T1 compression fracture, cardiomegaly No DVT  Assessment & Plan    Atrial fibrillation with RVR Presenting after a fall, noted to be in atrial fibrillation with elevated rate in the emergency room  Difficult rate control secondary to hypotension, Digoxin inadequate  control rate achieved Underwent TEE cardioversion June 11, normal sinus rhythm restored Beta-blocker, digoxin held for  bradycardia postprocedure Heparin transitioned, started on Eliquis 5 twice daily Given she remains intermittently bradycardic, will hold off on rate and rhythm controlling medications For recurrent atrial fibrillation, would consider getting EP involved, consideration of Tikosyn  Iron deficiency anemia -Would continue to monitor CBC on Eliquis  Hypotension Blood pressure improved with treatment of UTI Higher this morning in the setting of headache   Mechanical falls/leg weakness Working with PT, needs rehab at SNF  Migraine headaches Management per medicine service "H/a over the past three days" Did not mention until this Am Discussed with medicine service, recommended consideration of head CT   Total encounter time more than 35 minutes  Greater than 50% was  spent in counseling and coordination of care with the patient   For questions or updates, please contact [facility] Please consult [email or web address] for contact info under        Signed, Julien Nordmann, MD  02/16/2023, 11:53 AM

## 2023-02-16 NOTE — Progress Notes (Addendum)
CT head with intracranial bleed. Neurosurgery team notified. Critical care MD at bedside. I discussed with radiology large left subdural hematoma with 1.6 cm midline shift, uncal herniation. I call patient's husband at his mobile phone and updated him of brain bleed. He wishes her to be full code. He is currently admitted in rehab and could not get here, want me to update his son (no answer, goes to vm).  Son called hospital phone after father called him, updated about the clinical condition, poor prognosis. He wishes to continue current aggressive care, she did not wish for DNR. But I told him that if her condition deteriorates, we may need to readdress CODE STATUS. He understands.

## 2023-02-16 NOTE — Consult Note (Signed)
[facility] Customer service manager [facility]) Accountable Care Organization (ACO) [facility] Liaison Note  02/16/2023  [name] 1946/01/26 [id number]  Location: [facility] RN Hospital Liaison screened the patient remotely at [facility].  Insurance: MCR ACO   [name] is a 77 y.o. female who is a Primary Care Patient of Smiley Houseman, NP. The patient was screened for  readmission hospitalization with noted extreme risk score for unplanned readmission risk with 1 IP/2 ED in 6 months.  The patient was assessed for potential [facility]) Care Management service needs for post hospital transition for care coordination. Review of patient's electronic medical record reveals patient was admitted with Atrial Fibrillation. Pt is with a ALF ([facility]) with the facility provider. TOC documentation indicates pt will be discharged to a SNF level of care for ongoing rehab with a facility of choice.   Plan: [facility] Liaison will continue to follow progress and disposition to asess for post hospital community care coordination/management needs.  Referral request for community care coordination: pending disposition.   [facility] Care Management/Population Health does not replace or interfere with any arrangements made by the Inpatient Transition of Care team.   For questions contact:   Elliot Cousin, RN, BSN [facility] Liaison [place]   [facility]  Population Health Office Hours MTWF  8:00 am-6:00 pm Off on Thursday [phone] mobile [phone] [Office toll free line][facility] Office Hours are M-F 8:30 - 5 pm 24 hour nurse advise line [phone] Concierge  [email or web address]

## 2023-02-16 NOTE — Procedures (Signed)
Intubation Procedure Note  [name]  [id number]  1946/08/11  Date:02/16/23  Time:2:13 PM   Provider Performing:Jamyron Redd D Elvina Sidle    Procedure: Intubation (31500)  Indication(s) Respiratory Failure  Consent Unable to obtain consent due to emergent nature of procedure.   Anesthesia Etomidate, Fentanyl, and Rocuronium   Time Out Verified patient identification, verified procedure, site/side was marked, verified correct patient position, special equipment/implants available, medications/allergies/relevant history reviewed, required imaging and test results available.   Sterile Technique Usual hand hygeine, masks, and gloves were used   Procedure Description Patient positioned in bed supine.  Sedation given as noted above.  Patient was intubated with endotracheal tube using Glidescope.  View was Grade 1 full glottis .  Number of attempts was 1.  Colorimetric CO2 detector was consistent with tracheal placement.   Complications/Tolerance None; patient tolerated the procedure well. Chest X-ray is ordered to verify placement.   EBL Minimal   Specimen(s) None   Size 8.0 ETT Tube secured at 22 cm at the lip.   Harlon Ditty, AGACNP-BC [facility] Prefer epic messenger for cross cover needs If after hours, please call E-link

## 2023-02-16 NOTE — TOC Progression Note (Signed)
Transition of Care [facility]) - Progression Note    Patient Details  Name: [name] MRN: [id number] Date of Birth: 01-01-1946  Transition of Care [facility]) CM/SW Contact  Truddie Hidden, RN Phone Number: 02/16/2023, 12:25 PM  Clinical Narrative:    Spoke with patient's son to update about discharge plan.  Left message for Clide Cliff at [facility] inquiring about weekend discharge.     Expected Discharge Plan: Skilled Nursing Facility Barriers to Discharge: Continued Medical Work up  Expected Discharge Plan and Services     Post Acute Care Choice: Skilled Nursing Facility Living arrangements for the past 2 months: Assisted Living Facility                                       Social Determinants of Health (SDOH) Interventions SDOH Screenings   Food Insecurity: No Food Insecurity (02/09/2023)  Housing: Low Risk  (02/09/2023)  Transportation Needs: No Transportation Needs (02/09/2023)  Utilities: Not At Risk (02/09/2023)  Depression (PHQ2-9): High Risk (03/31/2022)  Tobacco Use: Low Risk  (02/14/2023)    Readmission Risk Interventions     No data to display

## 2023-02-16 NOTE — Anesthesia Preprocedure Evaluation (Addendum)
Anesthesia Evaluation  Patient identified by MRN, date of birth, ID band Patient confused    Reviewed: Allergy & Precautions, NPO status , Patient's Chart, lab work & pertinent test results  Airway Mallampati: Intubated       Dental  (+) Chipped   Pulmonary shortness of breath   Pulmonary exam normal        Cardiovascular Exercise Tolerance: Good +CHF  + dysrhythmias Atrial Fibrillation      Neuro/Psych  Headaches, Seizures -,  PSYCHIATRIC DISORDERS       Neuromuscular disease CVA    GI/Hepatic Neg liver ROS,GERD  Controlled,,  Endo/Other  Hypothyroidism    Renal/GU Renal disease     Musculoskeletal   Abdominal   Peds  Hematology negative hematology ROS (+)   Anesthesia Other Findings Past Medical History: No date: Anemia     Comment:  took Fe- orally 2 yrs. ago  No date: Anxiety     Comment:  panic attacks  No date: Arthritis     Comment:  shoulders, knees No date: Cancer (HCC)     Comment:  skin- preCA No date: Depression No date: GERD (gastroesophageal reflux disease)     Comment:  uses Protonix as needed  No date: Headache(784.0) No date: Humerus fracture     Comment:  Right  02/27/2013: Hyposmolality and/or hyponatremia No date: Hypothyroidism No date: IBS (irritable bowel syndrome) 09/05/2018: Injury of left foot 04/27/2014: Injury of right hand No date: Memory loss No date: Mental disorder No date: Migraine 02/26/2013: Preoperative examination No date: Seizures (HCC)     Comment:  caused by depression , seritonin seizure - effected               short term memory   No date: Shortness of breath  Past Surgical History: No date: ABDOMINAL HYSTERECTOMY No date: APPENDECTOMY 1990's: BACK SURGERY 1972: BREAST SURGERY     Comment:  augmentation  02/13/2023: CARDIOVERSION; N/A     Comment:  Procedure: CARDIOVERSION;  Surgeon: Antonieta Iba,               MD;  Location: [facility] ORS;  Service:  Cardiovascular;                Laterality: N/A; No date: CHOLECYSTECTOMY 1994: GASTRECTOMY     Comment:  Due to ulcer, required multiple revisions.  No date: HERNIA REPAIR 2004: ORIF ANKLE FRACTURE; Right No date: REPLACEMENT TOTAL KNEE 05/16/2013: REVERSE SHOULDER ARTHROPLASTY; Right     Comment:  Procedure: RIGHT HEMI ARTHROPLASTY  VERSES  REVERSE               TOTAL SHOULDER ARTHROPLASTY ;  Surgeon: Verlee Rossetti,               MD;  Location: [facility] OR;  Service: Orthopedics;  Laterality:              Right; 2005: SPINE SURGERY     Comment:  Lumbar spinal stenosis  No date: STOMACH SURGERY     Comment:  1/3 resection for obstruction 02/13/2023: TEE WITHOUT CARDIOVERSION; N/A     Comment:  Procedure: TRANSESOPHAGEAL ECHOCARDIOGRAM (TEE);                Surgeon: Antonieta Iba, MD;  Location: [facility] ORS;                Service: Cardiovascular;  Laterality: N/A; No date: TONSILLECTOMY No date: TUBAL LIGATION  BMI    Body Mass Index: 36.14  kg/m      Reproductive/Obstetrics negative OB ROS                             Anesthesia Physical Anesthesia Plan  ASA: 5 and emergent  Anesthesia Plan: General ETT   Post-op Pain Management:    Induction: Intravenous  PONV Risk Score and Plan: Ondansetron, Dexamethasone, Midazolam and Treatment may vary due to age or medical condition  Airway Management Planned: Oral ETT  Additional Equipment:   Intra-op Plan:   Post-operative Plan: Extubation in OR and Possible Post-op intubation/ventilation  Informed Consent: I have reviewed the patients History and Physical, chart, labs and discussed the procedure including the risks, benefits and alternatives for the proposed anesthesia with the patient or authorized representative who has indicated his/her understanding and acceptance.     Dental Advisory Given  Plan Discussed with: Anesthesiologist, CRNA and Surgeon  Anesthesia Plan Comments: (History and phone  consent from the patients spouse Evangelyne Lacewell at [phone]  Husband consented for risks of anesthesia including but not limited to:  - adverse reactions to medications - damage to eyes, teeth, lips or other oral mucosa - nerve damage due to positioning  - sore throat or hoarseness - Damage to heart, brain, nerves, lungs, other parts of body or loss of life  He voiced understanding.)       Anesthesia Quick Evaluation

## 2023-02-16 NOTE — Consult Note (Signed)
CRITICAL CARE PROGRESS NOTE    Name: [name] MRN: [id number] DOB: 11-29-1945     LOS: 8   SUBJECTIVE FINDINGS & SIGNIFICANT EVENTS    History of Presenting Illness:  This is a 77 year old female with a history of atrial fibrillation with rapid ventricular response, hypothyroidism, epilepsy, anxiety and depression, history of recurrent falls on multiple centrally acting medications came in with findings of atrial fibrillation rapid ventricular response was initially seen by cardiology medically treated placed on blood thinners with Eliquis underwent transesophageal echo with cardioversion.  She is being treated for diastolic CHF as well has UTI.  She does have a history of chronic diarrhea which was also managed on the floors.  Initially had some improvement symptomatically was being diuresed on the floor then suddenly developed headaches and was treated with narcotics for this.  She had CT head performed which showed subdural hematoma and patient was brought in to medical intensive care unit lethargic unable to protect airway with active intracranial bleeding.  Multiple consultants were contacted including neurosurgery and plan is made to take patient to the operating room for bur hole procedure.  Lines/tubes : Airway 8 mm (Active)  Secured at (cm) 23 cm 02/16/23 1405  Measured From Lips 02/16/23 1405  Secured By [employer] 02/16/23 1405  Cuff Pressure (cm H2O) Green OR 18-26 [facility] 02/16/23 1405     External Urinary Catheter (Active)  Collection Container Dedicated Suction Canister 02/16/23 0800  Suction (Verified suction is between 40-80 mmHg) Yes 02/16/23 0800  Securement Method None needed 02/16/23 0800  Site Assessment Clean, Dry, Intact 02/16/23 0800  Intervention Other (Comment) 02/16/23 0800   Output (mL) 650 mL 02/16/23 0032    Microbiology/Sepsis markers: Results for orders placed or performed during the hospital encounter of 02/07/23  Blood Culture (routine x 2)     Status: None   Collection Time: 02/07/23  7:27 PM   Specimen: BLOOD  Result Value Ref Range Status   Specimen Description BLOOD BLOOD LEFT FOREARM  Final   Special Requests   Final    BOTTLES DRAWN AEROBIC AND ANAEROBIC Blood Culture adequate volume   Culture   Final    NO GROWTH 5 DAYS Performed at [facility], [address] [place], [place] [address]    Report Status 02/12/2023 FINAL  Final  Blood Culture (routine x 2)     Status: None   Collection Time: 02/07/23  7:32 PM   Specimen: BLOOD  Result Value Ref Range Status   Specimen Description BLOOD BLOOD RIGHT ARM  Final   Special Requests   Final    BOTTLES DRAWN AEROBIC AND ANAEROBIC Blood Culture results may not be optimal due to an excessive volume of blood received in culture bottles   Culture   Final    NO GROWTH 5 DAYS Performed at [facility], [address], [place], [place] [address]    Report Status 02/12/2023 FINAL  Final  Urine Culture     Status: Abnormal   Collection Time: 02/07/23  8:47 PM   Specimen: Urine, Random  Result Value Ref Range Status   Specimen Description   Final    URINE, RANDOM Performed at [facility], [address], [place], [place] [address]    Special Requests   Final    NONE Reflexed from [id number] Performed at [facility], [address], [place], [place] [address]    Culture (A)  Final    >=100,000 COLONIES/mL  AEROCOCCUS SPECIES Standardized susceptibility testing for this organism is not available. Performed at [facility], [address], [place], [place] [address]    Report Status 02/09/2023 FINAL  Final    Anti-infectives:  Anti-infectives (From admission, onward)    Start     Dose/Rate Route Frequency Ordered Stop   02/12/23 2200  cephALEXin  (KEFLEX) capsule 500 mg        500 mg Oral Every 12 hours 02/12/23 1437 02/12/23 2226   02/09/23 1330  cephALEXin (KEFLEX) capsule 500 mg  Status:  Discontinued        500 mg Oral Every 12 hours 02/09/23 1235 02/12/23 1437   02/07/23 2215  cefTRIAXone (ROCEPHIN) 2 g in sodium chloride 0.9 % 100 mL IVPB  Status:  Discontinued        2 g 200 mL/hr over 30 Minutes Intravenous Every 24 hours 02/07/23 2209 02/09/23 1235   02/07/23 2030  ceFEPIme (MAXIPIME) 2 g in sodium chloride 0.9 % 100 mL IVPB        2 g 200 mL/hr over 30 Minutes Intravenous  Once 02/07/23 2017 02/07/23 2130   02/07/23 2030  metroNIDAZOLE (FLAGYL) IVPB 500 mg  Status:  Discontinued        500 mg 100 mL/hr over 60 Minutes Intravenous  Once 02/07/23 2017 02/07/23 2018   02/07/23 2030  vancomycin (VANCOCIN) IVPB 1000 mg/200 mL premix  Status:  Discontinued        1,000 mg 200 mL/hr over 60 Minutes Intravenous  Once 02/07/23 2017 02/07/23 2018        PAST MEDICAL HISTORY   Past Medical History:  Diagnosis Date   Anemia    took Fe- orally 2 yrs. ago    Anxiety    panic attacks    Arthritis    shoulders, knees   Cancer (HCC)    skin- preCA   Depression    GERD (gastroesophageal reflux disease)    uses Protonix as needed    Headache(784.0)    Humerus fracture    Right    Hyposmolality and/or hyponatremia 02/27/2013   Hypothyroidism    IBS (irritable bowel syndrome)    Injury of left foot 09/05/2018   Injury of right hand 04/27/2014   Memory loss    Mental disorder    Migraine    Preoperative examination 02/26/2013   Seizures (HCC)    caused by depression , seritonin seizure - effected short term memory     Shortness of breath      SURGICAL HISTORY   Past Surgical History:  Procedure Laterality Date   ABDOMINAL HYSTERECTOMY     APPENDECTOMY     BACK SURGERY  1990's   BREAST SURGERY  1972   augmentation    CARDIOVERSION N/A 02/13/2023   Procedure: CARDIOVERSION;  Surgeon: Antonieta Iba, MD;   Location: [facility] ORS;  Service: Cardiovascular;  Laterality: N/A;   CHOLECYSTECTOMY     GASTRECTOMY  1994   Due to ulcer, required multiple revisions.    HERNIA REPAIR     ORIF ANKLE FRACTURE Right 2004   REPLACEMENT TOTAL KNEE     REVERSE SHOULDER ARTHROPLASTY Right 05/16/2013   Procedure: RIGHT HEMI ARTHROPLASTY  VERSES  REVERSE TOTAL SHOULDER ARTHROPLASTY ;  Surgeon: Verlee Rossetti, MD;  Location: [facility] OR;  Service: Orthopedics;  Laterality: Right;   SPINE SURGERY  2005   Lumbar spinal stenosis    STOMACH SURGERY     1/3 resection for obstruction  TEE WITHOUT CARDIOVERSION N/A 02/13/2023   Procedure: TRANSESOPHAGEAL ECHOCARDIOGRAM (TEE);  Surgeon: Antonieta Iba, MD;  Location: [facility] ORS;  Service: Cardiovascular;  Laterality: N/A;   TONSILLECTOMY     TUBAL LIGATION       FAMILY HISTORY   Family History  Problem Relation Age of Onset   Aneurysm Mother    Heart disease Mother    Cancer Father        pancreatic   Stroke Father 36   Hypertension Father    Alcohol abuse Father    Heart disease Maternal Grandfather      SOCIAL HISTORY   Social History   Tobacco Use   Smoking status: Never   Smokeless tobacco: Never  Vaping Use   Vaping Use: Never used  Substance Use Topics   Alcohol use: Yes    Comment: rare use of white wine    Drug use: No     MEDICATIONS   Current Medication:  Current Facility-Administered Medications:    0.9 %  sodium chloride infusion, 250 mL, Intravenous, Continuous, Harlon Ditty D, NP   [MAR Hold] acetaminophen (TYLENOL) tablet 650 mg, 650 mg, Oral, Q6H PRN, 650 mg at 02/14/23 2329 **OR** [MAR Hold] acetaminophen (TYLENOL) suppository 650 mg, 650 mg, Rectal, Q6H PRN, Gertha Calkin, MD   [MAR Hold] Chlorhexidine Gluconate Cloth 2 % PADS 6 each, 6 each, Topical, Q0600, Marcelino Duster, MD   [MAR Hold] cholecalciferol (VITAMIN D3) 25 MCG (1000 UNIT) tablet 2,000 Units, 2,000 Units, Oral, Daily, Pudota, Elsie Ra, MD, 2,000 Units at  02/16/23 0839   [MAR Hold] coag fact Xa recombinant (ANDEXXA) low dose infusion 900 mg, 900 mg, Intravenous, Once, Caryl Pina, MD   [facility] Hold] divalproex (DEPAKOTE) DR tablet 125 mg, 125 mg, Oral, BID, Pudota, Elsie Ra, MD, 125 mg at 02/16/23 0840   [MAR Hold] docusate (COLACE) 50 MG/5ML liquid 100 mg, 100 mg, Per Tube, BID, Judithe Modest, NP   [facility] Hold] fentaNYL (SUBLIMAZE) bolus via infusion 25-100 mcg, 25-100 mcg, Intravenous, Q15 min PRN, Judithe Modest, NP   [MAR Hold] fentaNYL (SUBLIMAZE) injection 100 mcg, 100 mcg, Intravenous, Once, Harlon Ditty D, NP   fentaNYL in NS (105mcg/ml) infusion-PREMIX, 0-400 mcg/hr, Intravenous, Continuous, Ciella Obi, MD, Last Rate: 10 mL/hr at 02/16/23 1533, 100 mcg/hr at 02/16/23 1533   [MAR Hold] furosemide (LASIX) tablet 20 mg, 20 mg, Oral, Daily, Arida, Muhammad A, MD, 20 mg at 02/16/23 0839   [MAR Hold] hydrocortisone cream 1 % 1 Application, 1 Application, Topical, TID PRN, Marcelino Duster, MD   [MAR Hold] hyoscyamine (ANASPAZ) disintergrating tablet 0.125 mg, 0.125 mg, Oral, BID, Nazari, Walid A, RPH, 0.125 mg at 02/16/23 0840   [MAR Hold] iohexol (OMNIPAQUE) 350 MG/ML injection 75 mL, 75 mL, Intravenous, Once PRN, Caryl Pina, MD   [facility] Hold] levothyroxine (SYNTHROID) tablet 100 mcg, 100 mcg, Oral, Daily, Pudota, Elsie Ra, MD, 100 mcg at 02/16/23 0526   [MAR Hold] lidocaine (LIDODERM) 5 % 1 patch, 1 patch, Transdermal, Daily, Pudota, Elsie Ra, MD, 1 patch at 02/13/23 0846   [MAR Hold] mannitol 20 % infusion 50 g, 0.5 g/kg, Intravenous, Once, Caryl Pina, MD   [facility] Hold] midazolam (VERSED) injection 1-2 mg, 1-2 mg, Intravenous, Q1H PRN, Judithe Modest, NP   [facility] Hold] norepinephrine (LEVOPHED) 4mg  in (0.016 mg/mL) premix infusion, 2-10 mcg/min, Intravenous, Titrated, Harlon Ditty D, NP, Last Rate: 7.5 mL/hr at 02/16/23 1546, 2 mcg/min at 02/16/23 1546   [MAR Hold] Oral  care mouth rinse, 15 mL,  Mouth Rinse, PRN, Marcelino Duster, MD   [MAR Hold] Oral care mouth rinse, 15 mL, Mouth Rinse, Q2H, Karna Christmas, Sami Froh, MD   [facility] Hold] Oral care mouth rinse, 15 mL, Mouth Rinse, PRN, Vida Rigger, MD   [MAR Hold] oxyCODONE (Oxy IR/ROXICODONE) immediate release tablet 10 mg, 10 mg, Oral, Q8H, Pudota, Elsie Ra, MD, 10 mg at 02/16/23 0525   [MAR Hold] oxyCODONE (Oxy IR/ROXICODONE) immediate release tablet 5 mg, 5 mg, Oral, Q4H PRN, Marcelino Duster, MD, 5 mg at 02/16/23 1159   [MAR Hold] pantoprazole (PROTONIX) injection 40 mg, 40 mg, Intravenous, Daily, Harlon Ditty D, NP   [MAR Hold] polyethylene glycol (MIRALAX / GLYCOLAX) packet 17 g, 17 g, Per Tube, Daily, Harlon Ditty D, NP   [MAR Hold] pregabalin (LYRICA) capsule 100 mg, 100 mg, Oral, BID, Pudota, Elsie Ra, MD, 100 mg at 02/16/23 0840   [MAR Hold] QUEtiapine (SEROQUEL XR) 24 hr tablet 50 mg, 50 mg, Oral, QHS, Pudota, Elsie Ra, MD, 50 mg at 02/15/23 2147   [facility] Hold] ramelteon (ROZEREM) tablet 8 mg, 8 mg, Oral, QHS, Pudota, Elsie Ra, MD, 8 mg at 02/15/23 2147   [facility] Hold] SUMAtriptan (IMITREX) tablet 25 mg, 25 mg, Oral, Q2H PRN, Wouk, Wilfred Curtis, MD, 25 mg at 02/16/23 0841   [MAR Hold] traZODone (DESYREL) tablet 50-150 mg, 50-150 mg, Oral, QHS PRN, Quincy Sheehan, Elsie Ra, MD, 50 mg at 02/15/23 2147   [MAR Hold] vortioxetine HBr (TRINTELLIX) tablet 20 mg, 20 mg, Oral, Daily, Pudota, Elsie Ra, MD, 20 mg at 02/16/23 1610  Facility-Administered Medications Ordered in Other Encounters:    levETIRAcetam (KEPPRA) 500 mg in sodium chloride 0.9 % 100 mL IVPB, , Intravenous, Continuous PRN, Lynden Oxford, CRNA, 500 mg at 02/16/23 1546   propofol (DIPRIVAN) 500 MG/50ML infusion, , Intravenous, Continuous PRN, Lynden Oxford, CRNA, Last Rate: 29.55 mL/hr at 02/16/23 1541, 50 mcg/kg/min at 02/16/23 1541   rocuronium (ZEMURON) injection, , Intravenous, Anesthesia Intra-op, Lynden Oxford, CRNA, 40 mg at 02/16/23  1533    ALLERGIES   Bupropion hcl, Other, Penicillins, Tilactase, Azithromycin, and Erythromycin    REVIEW OF SYSTEMS     Unable to perform ROS due to encephalopathy with lethargy  PHYSICAL EXAMINATION   Vital Signs: Temp:  [98 F (36.7 C)-98.8 F (37.1 C)] 98 F (36.7 C) (06/14 0932) Pulse Rate:  [55-63] 60 (06/14 0932) Resp:  [16-19] 19 (06/14 1350) BP: (101-147)/(61-101) 101/87 (06/14 1350) SpO2:  [91 %-98 %] 94 % (06/14 1350) FiO2 (%):  [50 %] 50 % (06/14 1405) Weight:  [98.5 kg] 98.5 kg (06/14 1350)  GENERAL: Age-appropriate mild distress due to acute intracranial bleeding HEAD: Normocephalic, atraumatic.  EYES: Pupils equal, round, reactive to light.  No scleral icterus.  MOUTH: Moist mucosal membrane. NECK: Supple. No thyromegaly. No nodules. No JVD.  PULMONARY: Mild rhonchorous lung sounds bilaterally CARDIOVASCULAR: S1 and S2. Regular rate and rhythm. No murmurs, rubs, or gallops.  GASTROINTESTINAL: Soft, nontender, non-distended. No masses. Positive bowel sounds. No hepatosplenomegaly.  MUSCULOSKELETAL: No swelling, clubbing, or edema.  NEUROLOGIC: Lethargy positive SKIN:intact,warm,dry   PERTINENT DATA     Infusions:  sodium chloride     [MAR Hold] coag fact Xa recombinant (ANDEXXA) IV     fentaNYL infusion INTRAVENOUS 100 mcg/hr (02/16/23 1533)   [MAR Hold] mannitol 20% IV solution (preferred)     [MAR Hold] norepinephrine (LEVOPHED) Adult infusion 2 mcg/min (02/16/23 1546)   Scheduled Medications:  [MAR Hold] Chlorhexidine Gluconate Cloth  6 each Topical Q0600   [MAR Hold] cholecalciferol  2,000 Units Oral Daily   [MAR Hold] divalproex  125 mg Oral BID   [MAR Hold] docusate  100 mg Per Tube BID   [MAR Hold] fentaNYL (SUBLIMAZE) injection  100 mcg Intravenous Once   [MAR Hold] furosemide  20 mg Oral Daily   [MAR Hold] hyoscyamine  0.125 mg Oral BID   [MAR Hold] levothyroxine  100 mcg Oral Daily   [MAR Hold] lidocaine  1 patch Transdermal  Daily   [MAR Hold] mouth rinse  15 mL Mouth Rinse Q2H   [MAR Hold] oxyCODONE  10 mg Oral Q8H   [MAR Hold] pantoprazole (PROTONIX) IV  40 mg Intravenous Daily   [MAR Hold] polyethylene glycol  17 g Per Tube Daily   [MAR Hold] pregabalin  100 mg Oral BID   [MAR Hold] QUEtiapine  50 mg Oral QHS   [MAR Hold] ramelteon  8 mg Oral QHS   [MAR Hold] vortioxetine HBr  20 mg Oral Daily   PRN Medications: [MAR Hold] acetaminophen **OR** [MAR Hold] acetaminophen, [MAR Hold] fentaNYL, [MAR Hold] hydrocortisone cream, [MAR Hold] iohexol, [MAR Hold] midazolam, [MAR Hold] mouth rinse, [MAR Hold] mouth rinse, [MAR Hold] oxyCODONE, [MAR Hold] SUMAtriptan, [MAR Hold] traZODone Hemodynamic parameters:   Intake/Output: 06/13 0701 - 06/14 0700 In: 960 [P.O.:960] Out: 3100 [Urine:3100]  Ventilator  Settings: Vent Mode: PRVC FiO2 (%):  [50 %] 50 % Set Rate:  [20 bmp] 20 bmp Vt Set:  [480 mL] 480 mL PEEP:  [5 cmH20] 5 cmH20 Plateau Pressure:  [16 cmH20] 16 cmH20   LAB RESULTS:  Basic Metabolic Panel: Recent Labs  Lab 02/11/23 0533 02/12/23 0536 02/13/23 0622 02/14/23 0342 02/16/23 1239 02/16/23 1241  NA 140 135 134* 133* 130*  --   K 4.2 4.3 4.0 4.1 3.6  --   CL 103 100 99 100 94*  --   CO2 30 28 26 25 23   --   GLUCOSE 88 93 103* 93 203*  --   BUN 12 14 13 13 11   --   CREATININE 0.80 0.82 0.70 0.73 0.81  --   CALCIUM 8.2* 8.4* 7.9* 8.1* 8.5*  --   MG  --   --   --   --   --  1.8  PHOS  --   --   --   --   --  3.9   Liver Function Tests: Recent Labs  Lab 02/16/23 1239  AST 21  ALT 11  ALKPHOS 87  BILITOT 0.6  PROT 7.1  ALBUMIN 3.3*   No results for input(s): "LIPASE", "AMYLASE" in the last 168 hours. Recent Labs  Lab 02/16/23 1242  AMMONIA 31   CBC: Recent Labs  Lab 02/10/23 0746 02/11/23 0533 02/12/23 0536 02/16/23 0659 02/16/23 1241  WBC 6.5 5.9 6.8 5.9 15.8*  HGB 10.1* 10.0* 11.5* 10.6* 11.6*  HCT 32.4* 32.2* 36.3 33.0* 36.1  MCV 83.5 83.4 81.0 81.5 80.8  PLT  132* 142* 158 170 227   Cardiac Enzymes: No results for input(s): "CKTOTAL", "CKMB", "CKMBINDEX", "TROPONINI" in the last 168 hours. BNP: Invalid input(s): "POCBNP" CBG: Recent Labs  Lab 02/16/23 1242 02/16/23 1351  GLUCAP 199* 170*       ASSESSMENT AND PLAN    -Multidisciplinary rounds held today  Acute subdural hematoma -Patient on Eliquis, may need reversal agent -Status post endotracheal intubation and mechanical ventilation -Neurosurgery on case-appreciate input to take patient to the OR for neurosurgical intervention -Blood pressure  goals per neurology and neurosurgery-can consider hypertonic saline if necessary -Left internal jugular central venous access successfully placed -NSGY on case - please follow recommendations    Chronic diastolic heart failure with atrial fibrillation and rapid ventricular response -Currently rate controlled continue ICU telemetry monitoring and medical management as necessary -Holding anticoagulation for now in lieu of intracranial bleeding    Epilepsy -Patient takes depakote at home -currently on general anesthesia, will add as needed   Hypothyroidism  Continue levothyroxine    GI/Nutrition GI PROPHYLAXIS as indicated DIET-->TF's as tolerated Constipation protocol as indicated  ENDO - ICU hypoglycemic\Hyperglycemia protocol -check FSBS per protocol   ELECTROLYTES -follow labs as needed -replace as needed -pharmacy consultation   DVT/GI PRX ordered -SCDs  TRANSFUSIONS AS NEEDED MONITOR FSBS ASSESS the need for LABS as needed    Critical care provider statement:   Total critical care time: 33 minutes   Performed by: Karna Christmas MD   Critical care time was exclusive of separately billable procedures and treating other patients.   Critical care was necessary to treat or prevent imminent or life-threatening deterioration.   Critical care was time spent personally by me on the following activities: development  of treatment plan with patient and/or surrogate as well as nursing, discussions with consultants, evaluation of patient's response to treatment, examination of patient, obtaining history from patient or surrogate, ordering and performing treatments and interventions, ordering and review of laboratory studies, ordering and review of radiographic studies, pulse oximetry and re-evaluation of patient's condition.    Vida Rigger, M.D.  Pulmonary & Critical Care Medicine

## 2023-02-16 NOTE — Consult Note (Signed)
NEURO HOSPITALIST CONSULT NOTE   Requestig physician: Dr. Clide Dales  Reason for Consult: Altered mental status  History obtained from:  RN and Chart     HPI:                                                                                                                                          [name] is an 77 y.o. female with a PMHx of anemia, anxiety, seizures, depression, headaches, hypothyroidism, memory loss and migraine headaches who was admitted on June 5 from her SNF due to pain when attempting to ambulate. 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 Eliquis was initiated for atrial fibrillation. Today, she acutely deteriorated with AMS and some shaking noted. EEG was ordered and a Code Stroke was called. CT head revealed a large acute to subacute left subdural hematoma as well as additional new subdural hematomas at different locations; prominent mass effect with left to right midline shift was noted.   Past Medical History:  Diagnosis Date   Anemia    took Fe- orally 2 yrs. ago    Anxiety    panic attacks    Arthritis    shoulders, knees   Cancer (HCC)    skin- preCA   Depression    GERD (gastroesophageal reflux disease)    uses Protonix as needed    Headache(784.0)    Humerus fracture    Right    Hyposmolality and/or hyponatremia 02/27/2013   Hypothyroidism    IBS (irritable bowel syndrome)    Injury of left foot 09/05/2018   Injury of right hand 04/27/2014   Memory loss    Mental disorder    Migraine    Preoperative examination 02/26/2013   Seizures (HCC)    caused by depression , seritonin seizure - effected short term memory     Shortness of breath     Past Surgical History:  Procedure Laterality Date   ABDOMINAL HYSTERECTOMY     APPENDECTOMY     BACK SURGERY  1990's   BREAST SURGERY  1972   augmentation    CARDIOVERSION N/A  02/13/2023   Procedure: CARDIOVERSION;  Surgeon: Antonieta Iba, MD;  Location: [facility] ORS;  Service: Cardiovascular;  Laterality: N/A;   CHOLECYSTECTOMY     GASTRECTOMY  1994   Due to ulcer, required multiple revisions.    HERNIA REPAIR     ORIF ANKLE FRACTURE Right 2004   REPLACEMENT TOTAL KNEE     REVERSE SHOULDER ARTHROPLASTY Right 05/16/2013   Procedure: RIGHT HEMI ARTHROPLASTY  VERSES  REVERSE TOTAL SHOULDER ARTHROPLASTY ;  Surgeon: Verlee Rossetti, MD;  Location: [facility] OR;  Service: Orthopedics;  Laterality: Right;   SPINE  SURGERY  2005   Lumbar spinal stenosis    STOMACH SURGERY     1/3 resection for obstruction   TEE WITHOUT CARDIOVERSION N/A 02/13/2023   Procedure: TRANSESOPHAGEAL ECHOCARDIOGRAM (TEE);  Surgeon: Antonieta Iba, MD;  Location: [facility] ORS;  Service: Cardiovascular;  Laterality: N/A;   TONSILLECTOMY     TUBAL LIGATION      Family History  Problem Relation Age of Onset   Aneurysm Mother    Heart disease Mother    Cancer Father        pancreatic   Stroke Father 62   Hypertension Father    Alcohol abuse Father    Heart disease Maternal Grandfather             Social History:  reports that she has never smoked. She has never used smokeless tobacco. She reports current alcohol use. She reports that she does not use drugs.  Allergies  Allergen Reactions   Bupropion Hcl Other (See Comments)    SEIZURES!!   Other    Penicillins    Tilactase Diarrhea   Azithromycin Rash   Erythromycin Rash    MEDICATIONS:                                                                                                                     Scheduled:  [START ON 02/17/2023] Chlorhexidine Gluconate Cloth  6 each Topical Q0600   cholecalciferol  2,000 Units Oral Daily   divalproex  125 mg Oral BID   etomidate  20 mg Intravenous Once   fentaNYL (SUBLIMAZE) injection  100 mcg Intravenous Once   furosemide  20 mg Oral Daily   hyoscyamine  0.125 mg Oral BID   levothyroxine  100  mcg Oral Daily   lidocaine  1 patch Transdermal Daily   mouth rinse  15 mL Mouth Rinse Q2H   oxyCODONE  10 mg Oral Q8H   pantoprazole  40 mg Oral Daily   pregabalin  100 mg Oral BID   QUEtiapine  50 mg Oral QHS   ramelteon  8 mg Oral QHS   rocuronium bromide  50 mg Intravenous Once   vortioxetine HBr  20 mg Oral Daily   Continuous:  sodium chloride     coag fact Xa recombinant (ANDEXXA) IV     fentaNYL infusion INTRAVENOUS     norepinephrine     norepinephrine (LEVOPHED) Adult infusion       ROS:  Unable to obtain due to AMS.    Blood pressure (!) 147/74, pulse 60, temperature 98 F (36.7 C), resp. rate 16, height 5\' 5"  (1.651 m), weight 99.4 kg, SpO2 92 %.   General Examination:                                                                                                       Physical Exam  HEENT-  [place]/AT    Lungs- Non-tachypneic Extremities- No edema   Neurological Examination Mental Status: Depressed level of consciousness with waxing and waning, followed by an awake state with right hemineglect after she was moved from transport stretcher back to her bed. Will utter only a few unintelligible sounds in response to questions. Does not follow commands.  Cranial Nerves: II: Does not reliably blink to threat. Right pupil 2 mm and reactive. Left pupil 3 mm and reactive.   III,IV, VI: Eyes are conjugate. She will glance to the left and right in response to auditory stimuli.  V,VII: No definite asymmetry noted.  VIII: Unable to assess IX,X: Gag reflex deferred XI: Head preferentially rotated to the left XII: Not following commands for testing of tongue protrusion Motor/Sensory: RUE with no movement to pinch. Drops to bed immediately after passive elevation and release. Exclaims in discomfort to pinch.  RLE with dorsiflexion of foot and  minimal knee flexion in response to noxious and exclaims in discomfort.  LUE remains elevated after examiner raises limb. Does not follow commands for testing against resistance. Exclaims with discomfort when pinched.  LLE withdraws to noxious plantar stimulation and patient exclaims in discomfort.  Deep Tendon Reflexes: 1+ bilateral brachioradialis. Unable to elicit patellar or achilles reflexes.  Plantars: Right: downgoing   Left: downgoing Cerebellar/Gait: Unable to assess    Lab Results: Basic Metabolic Panel: Recent Labs  Lab 02/11/23 0533 02/12/23 0536 02/13/23 0622 02/14/23 0342 02/16/23 1239  NA 140 135 134* 133* 130*  K 4.2 4.3 4.0 4.1 3.6  CL 103 100 99 100 94*  CO2 30 28 26 25 23   GLUCOSE 88 93 103* 93 203*  BUN 12 14 13 13 11   CREATININE 0.80 0.82 0.70 0.73 0.81  CALCIUM 8.2* 8.4* 7.9* 8.1* 8.5*    CBC: Recent Labs  Lab 02/10/23 0746 02/11/23 0533 02/12/23 0536 02/16/23 0659 02/16/23 1241  WBC 6.5 5.9 6.8 5.9 15.8*  HGB 10.1* 10.0* 11.5* 10.6* 11.6*  HCT 32.4* 32.2* 36.3 33.0* 36.1  MCV 83.5 83.4 81.0 81.5 80.8  PLT 132* 142* 158 170 227    Cardiac Enzymes: No results for input(s): "CKTOTAL", "CKMB", "CKMBINDEX", "TROPONINI" in the last 168 hours.  Lipid Panel: No results for input(s): "CHOL", "TRIG", "HDL", "CHOLHDL", "VLDL", "LDLCALC" in the last 168 hours.  Imaging: No results found.   Assessment: 77 year old female with acute change in mental status. CT head reveals a large left subdural hematoma and a parafalcine subdural hematoma with mass effect and left to right midline shift.  - Exam reveals an aphasic patient with right hemineglect and waxing/waning level of consciousness.  - CT head:  A new acute large subdural hematoma over the left cerebral convexity measures up to 2.1 cm in thickness. There is prominent mass effect on the left cerebral hemisphere with partial effacement of the lateral and third ventricles, 1.6 cm of rightward midline  shift, and left uncal herniation. Mild dilatation of the right lateral ventricle may reflect early trapping. Subdural hemorrhage extending along the falx measures up to 1.5 cm in thickness, and subdural hemorrhage extending along the left tentorium measures up to 0.6 cm in thickness. A new small mixed density subdural hematoma anteriorly over the right frontal convexity measures 0.5 cm in thickness. - DDx for underlying etiology includes possible slow bleed after the fall she presented with, which was not detectable on initial CT. Anticoagulation a contributing factor.   Recommendations: - Dr. Katrinka Blazing of Neurosurgery has been paged to assess for possible emergent surgical evacuation of the large subdural hematoma.  - Was on apixaban 5 mg BID with last dose given this AM. Anticoagulation has been discontinued.  - Pharmacy has been contacted and anticoagulant reversal orders are being placed.  - EEG ordered after onset of AMS but before CT revealing the subdurals. EEG has been discontinued.  - Husband wishes for patient to remain full code - Mannitol bolus has been requested by Neurosurgery.  - Neurology will be available PRN. Please call if there are additional questions.    Electronically signed: Dr. Caryl Pina 02/16/2023, 1:13 PM

## 2023-02-16 NOTE — Progress Notes (Signed)
Transported to CT with respiratory and charge nurse assistance, no incidents.

## 2023-02-16 NOTE — Op Note (Signed)
Indications: The patient is a 77yo female who presented with a subdural hematoma.  Due to ongoing brain compression and symptoms, surgical intervention was recommended.   Findings: subdural hematoma  Preoperative Diagnosis: subdural hematoma Postoperative Diagnosis: same   EBL: 600 ml IVF: See Anesthesia Report, patient reversed with andexxa prior to surgery Drains: subgaleal drain to bulb suction Disposition: intubated and back to ICU Complications: None Immediately   Preoperative Note: 77 year old woman who was admitted for a A-fib with RVR.  She was being treated with Eliquis.  She had an acute change in her mental status earlier this afternoon.  After workup she was found to have a large left-sided subdural hematoma with left to right midline shift falcine herniation and uncal herniation.  Given the acute change in her mental status she was intubated.  Patient was discussed with her family members.  Prior to surgery she still maintained her pupillary reflexes.  Prior to intubation she was waking with moving.  We discussed the risk and benefits of surgery.  Patient family wanted to go forward with surgery.  Risks of surgery discussed include: infection, bleeding, stroke, coma, death, paralysis, CSF leak, nerve/spinal cord injury, numbness, tingling, weakness, vascular injury, need for further surgery, persistent symptoms, and the risks of anesthesia. The patient family understood these risks and agreed to proceed.  NAME OF PROCEDURE:               1.  Left frontotemporoparietal craniotomy for evacuation of hematoma   PROCEDURE:  Patient was brought to the operating room, intubated. The mayfield pins were applied.  The patient was then positioned for a left-sided frontotemporoparietal craniotomy.    The incision was planned, then prepped and draped in standard fashion.  The incision was opened sharply, then the galea opened.  A retractor was placed.  The temporalis muscle was divided, then  the periosteal used to reflect the muscle.   A frontotemporoparietal craniotomy was then fashioned with the burr and craniotome.  The dura was identified, then opened sharply.  A massive subdural hematoma was identified.  The acute subdural was then removed using irrigation and suction.  After removal, the intradural space was inspected and hemostasis achieved.  We are able to identify a single bridging vein which had appeared to have torn and had active leaking.  This was coagulated with cautery.  After hemostasis was achieved, we turned attention to closure.  The dura was approximated.  Tackup sutures were placed. The craniotomy site was checked and a fixation plate used to reconstruct the skull.  The temporalis and galea were closed.  Staples were used on the skin. A sterile dressing was placed.  A subgaleal drain was placed.  Needle, lap and all counts were correct at the end of the case.    Lovenia Kim, MD Neurosurgery

## 2023-02-16 NOTE — Transfer of Care (Signed)
Immediate Anesthesia Transfer of Care Note  Patient: [name]  Procedure(s) Performed: CRANIOTOMY HEMATOMA EVACUATION SUBDURAL (Left) CRANIOTOMY BONE FLAP/PROSTHETIC PLATE  Patient Location: ICU  Anesthesia Type:General  Level of Consciousness: Patient remains intubated per anesthesia plan  Airway & Oxygen Therapy: Patient remains intubated per anesthesia plan  Post-op Assessment: Report given to RN and Post -op Vital signs reviewed and stable  Post vital signs: Reviewed  Last Vitals:  Vitals Value Taken Time  BP 90/63 02/16/23 1745  Temp 36.4 C 02/16/23 1741  Pulse 57 02/16/23 1746  Resp 18 02/16/23 1746  SpO2 100 % 02/16/23 1746  Vitals shown include unvalidated device data.  Last Pain:  Vitals:   02/16/23 1741  TempSrc: Axillary  PainSc:       Patients Stated Pain Goal: 0 (02/14/23 1839)  Complications: No notable events documented.

## 2023-02-16 NOTE — Progress Notes (Signed)
Consulting Department:  ICU  Primary Physician:  Smiley Houseman, NP  Chief Complaint:  SDH  History of Present Illness: 02/16/2023 [name] is a 77 y.o. female who presents with the chief complaint of Left massive SDH.  She was admitted for anticoagulation and treatment of A-fib with RVR.  Had an acute decline.  Was found to have a large left-sided subdural hematoma and was taken to the OR.  Postop day 0: Patient was returned to the intensive care unit intubated.  A subgaleal drain was placed to bulb suction.  Review of Systems:  A 10 point review of systems is negative, except for the pertinent positives and negatives detailed in the HPI.  Past Medical History: Past Medical History:  Diagnosis Date   Anemia    took Fe- orally 2 yrs. ago    Anxiety    panic attacks    Arthritis    shoulders, knees   Cancer (HCC)    skin- preCA   Depression    GERD (gastroesophageal reflux disease)    uses Protonix as needed    Headache(784.0)    Humerus fracture    Right    Hyposmolality and/or hyponatremia 02/27/2013   Hypothyroidism    IBS (irritable bowel syndrome)    Injury of left foot 09/05/2018   Injury of right hand 04/27/2014   Memory loss    Mental disorder    Migraine    Preoperative examination 02/26/2013   Seizures (HCC)    caused by depression , seritonin seizure - effected short term memory     Shortness of breath     Past Surgical History: Past Surgical History:  Procedure Laterality Date   ABDOMINAL HYSTERECTOMY     APPENDECTOMY     BACK SURGERY  1990's   BREAST SURGERY  1972   augmentation    CARDIOVERSION N/A 02/13/2023   Procedure: CARDIOVERSION;  Surgeon: Antonieta Iba, MD;  Location: [facility] ORS;  Service: Cardiovascular;  Laterality: N/A;   CHOLECYSTECTOMY     GASTRECTOMY  1994   Due to ulcer, required multiple revisions.    HERNIA REPAIR     ORIF ANKLE FRACTURE Right 2004   REPLACEMENT TOTAL KNEE     REVERSE SHOULDER ARTHROPLASTY Right  05/16/2013   Procedure: RIGHT HEMI ARTHROPLASTY  VERSES  REVERSE TOTAL SHOULDER ARTHROPLASTY ;  Surgeon: Verlee Rossetti, MD;  Location: [facility] OR;  Service: Orthopedics;  Laterality: Right;   SPINE SURGERY  2005   Lumbar spinal stenosis    STOMACH SURGERY     1/3 resection for obstruction   TEE WITHOUT CARDIOVERSION N/A 02/13/2023   Procedure: TRANSESOPHAGEAL ECHOCARDIOGRAM (TEE);  Surgeon: Antonieta Iba, MD;  Location: [facility] ORS;  Service: Cardiovascular;  Laterality: N/A;   TONSILLECTOMY     TUBAL LIGATION      Allergies: Allergies as of 02/07/2023 - Unable to Assess 02/07/2023  Allergen Reaction Noted   Bupropion hcl Other (See Comments) 04/15/2009   Other  07/02/2020   Penicillins  05/27/2021   Tilactase Diarrhea 05/08/2013   Azithromycin Rash 04/15/2009   Erythromycin Rash 04/15/2009    Medications:  Current Facility-Administered Medications:    0.9 %  sodium chloride infusion, 250 mL, Intravenous, Continuous, Harlon Ditty D, NP   acetaminophen (TYLENOL) tablet 650 mg, 650 mg, Oral, Q6H PRN, 650 mg at 02/14/23 2329 **OR** acetaminophen (TYLENOL) suppository 650 mg, 650 mg, Rectal, Q6H PRN, Gertha Calkin, MD   [START ON 02/17/2023] Chlorhexidine Gluconate Cloth 2 % PADS  6 each, 6 each, Topical, Q0600, Sreeram, Narendranath, MD   cholecalciferol (VITAMIN D3) 25 MCG (1000 UNIT) tablet 2,000 Units, 2,000 Units, Oral, Daily, Pudota, Elsie Ra, MD, 2,000 Units at 02/16/23 0839   coag fact Xa recombinant (ANDEXXA) low dose infusion 900 mg, 900 mg, Intravenous, Once, Caryl Pina, MD   divalproex (DEPAKOTE) DR tablet 125 mg, 125 mg, Oral, BID, Pudota, Elsie Ra, MD, 125 mg at 02/16/23 0840   docusate (COLACE) 50 MG/5ML liquid 100 mg, 100 mg, Per Tube, BID, Harlon Ditty D, NP   fentaNYL (SUBLIMAZE) bolus via infusion 25-100 mcg, 25-100 mcg, Intravenous, Q15 min PRN, Harlon Ditty D, NP   fentaNYL (SUBLIMAZE) injection 100 mcg, 100 mcg, Intravenous, Once, Harlon Ditty D, NP    fentaNYL in NS (70mcg/ml) infusion-PREMIX, 0-400 mcg/hr, Intravenous, Continuous, Vida Rigger, MD, Last Rate: 10 mL/hr at 02/16/23 1533, 100 mcg/hr at 02/16/23 1533   furosemide (LASIX) tablet 20 mg, 20 mg, Oral, Daily, Arida, Muhammad A, MD, 20 mg at 02/16/23 1610   hydrocortisone cream 1 % 1 Application, 1 Application, Topical, TID PRN, Marcelino Duster, MD   hyoscyamine (ANASPAZ) disintergrating tablet 0.125 mg, 0.125 mg, Oral, BID, Nazari, Walid A, RPH, 0.125 mg at 02/16/23 0840   iohexol (OMNIPAQUE) 350 MG/ML injection 75 mL, 75 mL, Intravenous, Once PRN, Caryl Pina, MD   levothyroxine (SYNTHROID) tablet 100 mcg, 100 mcg, Oral, Daily, Pudota, Elsie Ra, MD, 100 mcg at 02/16/23 0526   lidocaine (LIDODERM) 5 % 1 patch, 1 patch, Transdermal, Daily, Pudota, Elsie Ra, MD, 1 patch at 02/13/23 0846   mannitol 20 % infusion 50 g, 0.5 g/kg, Intravenous, Once, Caryl Pina, MD   midazolam (VERSED) injection 1-2 mg, 1-2 mg, Intravenous, Q1H PRN, Harlon Ditty D, NP   norepinephrine (LEVOPHED) 4mg  in (0.016 mg/mL) premix infusion, 2-10 mcg/min, Intravenous, Titrated, Harlon Ditty D, NP, Last Rate: 22.5 mL/hr at 02/16/23 1639, 6 mcg/min at 02/16/23 1639   Oral care mouth rinse, 15 mL, Mouth Rinse, PRN, Marcelino Duster, MD   Oral care mouth rinse, 15 mL, Mouth Rinse, Q2H, Aleskerov, Fuad, MD   Oral care mouth rinse, 15 mL, Mouth Rinse, PRN, Karna Christmas, Fuad, MD   oxyCODONE (Oxy IR/ROXICODONE) immediate release tablet 10 mg, 10 mg, Oral, Q8H, Pudota, Elsie Ra, MD, 10 mg at 02/16/23 9604   oxyCODONE (Oxy IR/ROXICODONE) immediate release tablet 5 mg, 5 mg, Oral, Q4H PRN, Marcelino Duster, MD, 5 mg at 02/16/23 1159   pantoprazole (PROTONIX) injection 40 mg, 40 mg, Intravenous, Daily, Harlon Ditty D, NP   polyethylene glycol (MIRALAX / GLYCOLAX) packet 17 g, 17 g, Per Tube, Daily, Harlon Ditty D, NP   pregabalin (LYRICA) capsule 100 mg, 100 mg, Oral, BID,  Pudota, Kingsley P, MD, 100 mg at 02/16/23 0840   propofol (DIPRIVAN) 1000 MG/100ML infusion, 5-80 mcg/kg/min, Intravenous, Titrated, Harlon Ditty D, NP   QUEtiapine (SEROQUEL XR) 24 hr tablet 50 mg, 50 mg, Oral, QHS, Pudota, Kingsley P, MD, 50 mg at 02/15/23 2147   ramelteon (ROZEREM) tablet 8 mg, 8 mg, Oral, QHS, Pudota, Kingsley P, MD, 8 mg at 02/15/23 2147   SUMAtriptan (IMITREX) tablet 25 mg, 25 mg, Oral, Q2H PRN, Wouk, Wilfred Curtis, MD, 25 mg at 02/16/23 0841   traZODone (DESYREL) tablet 50-150 mg, 50-150 mg, Oral, QHS PRN, Quincy Sheehan, Elsie Ra, MD, 50 mg at 02/15/23 2147   vortioxetine HBr (TRINTELLIX) tablet 20 mg, 20 mg, Oral, Daily, Pudota, Elsie Ra, MD, 20 mg at 02/16/23 5409   Social History:  Social History   Tobacco Use   Smoking status: Never   Smokeless tobacco: Never  Vaping Use   Vaping Use: Never used  Substance Use Topics   Alcohol use: Yes    Comment: rare use of white wine    Drug use: No    Family Medical History: Family History  Problem Relation Age of Onset   Aneurysm Mother    Heart disease Mother    Cancer Father        pancreatic   Stroke Father 62   Hypertension Father    Alcohol abuse Father    Heart disease Maternal Grandfather     Physical Examination: Vitals:   02/16/23 1535 02/16/23 1741  BP: 102/72 91/65  Pulse: 68 (!) 56  Resp: 12 14  Temp:  97.6 F (36.4 C)  SpO2: 100% 100%     General: Patient is well developed, well nourished, calm, collected, and in no apparent distress.  NEUROLOGICAL:  General: Intubated and sedated Patient remains intubated and sedated.  Her pupillary responses remain stable.  Her incision is covered with Telfa her subgaleal drain is to bulb suction with bright red blood.  GCS:3T   Imaging: No postop imaging yet next  I have personally reviewed the images and agree with the above interpretation.  Labs:    Latest Ref Rng & Units 02/16/2023   12:41 PM 02/16/2023    6:59 AM 02/12/2023    5:36 AM   CBC  WBC 4.0 - 10.5 K/uL 15.8  5.9  6.8   Hemoglobin 12.0 - 15.0 g/dL 16.1  09.6  04.5   Hematocrit 36.0 - 46.0 % 36.1  33.0  36.3   Platelets 150 - 400 K/uL 227  170  158        Assessment and Plan: Ms. Ozier is a pleasant 77 y.o. female with left-sided subdural hematoma status post craniotomy for evacuation and placement of a subgaleal drain.  Neurologic plans/recommendations: 1.  Repeat head CT when stable.  2.  Subgaleal drain to thumb suction, record outputs 3.  Antibiotics for drain prophylaxis while the subgaleal drain stays in place. 4.  Defer antiepileptics to neurology, patient was given an IntraOp dose of Keppra. 5.  Avoid anticoagulation in the immediate postoperative period, would like a morning head CT to follow-up. 6.  Please page or call with any concerns.    Lovenia Kim, MD/MSCR Dept. of Neurosurgery

## 2023-02-16 NOTE — Progress Notes (Signed)
Pharmacy Antibiotic Note  [name] is a 77 y.o. female admitted on 02/07/2023 with  left fronto temporoparietal craniotomy with subgaleal drain .  Pharmacy has been consulted for cefazolin dosing.   Received cefazolin 2 grams on 6/14 @1556 . Plan is to continue cefazolin while drain stays in place. CrCl > 60 mL/min.  Plan: Start cefazolin IV 2 grams every 8 hours Follow up renal function, and drain duration.  Height: 5\' 5"  (165.1 cm) Weight: 98.5 kg (217 lb 2.5 oz) IBW/kg (Calculated) : 57  Temp (24hrs), Avg:98.4 F (36.9 C), Min:97.6 F (36.4 C), Max:98.8 F (37.1 C)  Recent Labs  Lab 02/10/23 0746 02/11/23 0533 02/12/23 0536 02/13/23 0622 02/14/23 0342 02/16/23 0659 02/16/23 1239 02/16/23 1241 02/16/23 1242  WBC 6.5 5.9 6.8  --   --  5.9  --  15.8*  --   CREATININE 0.85 0.80 0.82 0.70 0.73  --  0.81  --   --   LATICACIDVEN  --   --   --   --   --   --   --   --  3.2*    Estimated Creatinine Clearance: 65.4 mL/min (by C-G formula based on SCr of 0.81 mg/dL).    Allergies  Allergen Reactions   Bupropion Hcl Other (See Comments)    SEIZURES!!   Other    Penicillins    Tilactase Diarrhea   Azithromycin Rash   Erythromycin Rash    Antimicrobials this admission: Ceftriaxone 6/5 > 6/7 Cephalexin 6/7 >> 6/10 Cefazolin 6/14 >>   Dose adjustments this admission: N/a  Microbiology results: 6/5 BCx: no growth x 5 days 6/5 UCx: aerococcus species - completed course of antibiotics. 6/5 MRSA PCR: detected  Thank you for allowing pharmacy to be a part of this patient's care.  Elliot Gurney, PharmD, BCPS Clinical Pharmacist  02/16/2023 6:58 PM

## 2023-02-16 NOTE — Progress Notes (Signed)
Received patient from OR at approximately 1740, surgical sites assessed, and neuro check completed.

## 2023-02-16 NOTE — Progress Notes (Signed)
Received patient to ICU 19 at 1350, after arrival to unit patient was seen by Dr. Karna Christmas, who ordered for intubation for airway protection, and neurology.  Patient was then emergently intubated by NP after ordered medications were given and RT present at bedside, NP then emergently placed central line to the Left internal jugular.  Time out performed for both procedures along with proper technique and sterility.  Verification of ETT tube and central line completed with CXR.  Neurosurgeon then at bedside and spoke with son who consented for surgery.  Report given to OR staff and patient transferred to OR, with this nurse present during transfer; patient transferred in hospital bed on ventilator and with cardiac monitoring.

## 2023-02-16 NOTE — Progress Notes (Signed)
OT Cancellation Note  Patient Details Name: [name] MRN: [id number] DOB: 08-Mar-1946   Cancelled Treatment:    Reason Eval/Treat Not Completed: Medical issues which prohibited therapy. Pt had an acute decline this afternoon, code stroke called. CT scan revealed a large left-sided subdural hematoma. Pt transferred to ICU. Will continue with OT following new orders and as pt is medically appropriate.   Latina Craver 02/16/2023, 3:36 PM

## 2023-02-16 NOTE — Progress Notes (Signed)
PT Cancellation Note  Patient Details Name: [name] MRN: [id number] DOB: 1945-09-19   Cancelled Treatment:    Reason Eval/Treat Not Completed:  (Patient declined due to reported migraine headache. PT to continue with attempts as appropriate.)  Donna Bernard, PT, MPT  Ina Homes 02/16/2023, 11:34 AM

## 2023-02-17 ENCOUNTER — Inpatient Hospital Stay: Payer: Medicare Other

## 2023-02-17 DIAGNOSIS — I6300 Cerebral infarction due to thrombosis of unspecified precerebral artery: Secondary | ICD-10-CM

## 2023-02-17 DIAGNOSIS — I4891 Unspecified atrial fibrillation: Secondary | ICD-10-CM | POA: Diagnosis not present

## 2023-02-17 DIAGNOSIS — I509 Heart failure, unspecified: Secondary | ICD-10-CM | POA: Diagnosis not present

## 2023-02-17 DIAGNOSIS — Z7189 Other specified counseling: Secondary | ICD-10-CM

## 2023-02-17 DIAGNOSIS — R0902 Hypoxemia: Secondary | ICD-10-CM | POA: Diagnosis not present

## 2023-02-17 DIAGNOSIS — I6201 Nontraumatic acute subdural hemorrhage: Secondary | ICD-10-CM | POA: Diagnosis not present

## 2023-02-17 DIAGNOSIS — F03918 Unspecified dementia, unspecified severity, with other behavioral disturbance: Secondary | ICD-10-CM | POA: Diagnosis not present

## 2023-02-17 DIAGNOSIS — N3000 Acute cystitis without hematuria: Secondary | ICD-10-CM | POA: Diagnosis not present

## 2023-02-17 DIAGNOSIS — Z515 Encounter for palliative care: Secondary | ICD-10-CM | POA: Diagnosis not present

## 2023-02-17 DIAGNOSIS — J9690 Respiratory failure, unspecified, unspecified whether with hypoxia or hypercapnia: Secondary | ICD-10-CM | POA: Diagnosis not present

## 2023-02-17 LAB — BASIC METABOLIC PANEL
Anion gap: 9 (ref 5–15)
BUN: 8 mg/dL (ref 8–23)
CO2: 23 mmol/L (ref 22–32)
Calcium: 7.7 mg/dL — ABNORMAL LOW (ref 8.9–10.3)
Chloride: 96 mmol/L — ABNORMAL LOW (ref 98–111)
Creatinine, Ser: 0.73 mg/dL (ref 0.44–1.00)
GFR, Estimated: >60 mL/min (ref >60)
Glucose, Bld: 150 mg/dL — ABNORMAL HIGH (ref 70–99)
Potassium: 4.7 mmol/L (ref 3.5–5.1)
Sodium: 128 mmol/L — ABNORMAL LOW (ref 135–145)

## 2023-02-17 LAB — CBC
HCT: 30.4 % — ABNORMAL LOW (ref 36.0–46.0)
Hemoglobin: 9.8 g/dL — ABNORMAL LOW (ref 12.0–15.0)
MCH: 26.3 pg (ref 26.0–34.0)
MCHC: 32.2 g/dL (ref 30.0–36.0)
MCV: 81.7 fL (ref 80.0–100.0)
Platelets: 190 10*3/uL (ref 150–400)
RBC: 3.72 MIL/uL — ABNORMAL LOW (ref 3.87–5.11)
RDW: 15.9 % — ABNORMAL HIGH (ref 11.5–15.5)
WBC: 12.4 10*3/uL — ABNORMAL HIGH (ref 4.0–10.5)
nRBC: 0 % (ref 0.0–0.2)

## 2023-02-17 LAB — TRIGLYCERIDES: Triglycerides: 94 mg/dL (ref <150)

## 2023-02-17 LAB — PROTIME-INR
INR: 1.2 (ref 0.8–1.2)
Prothrombin Time: 15.3 seconds — ABNORMAL HIGH (ref 11.4–15.2)

## 2023-02-17 LAB — CULTURE, BLOOD (ROUTINE X 2)

## 2023-02-17 LAB — GLUCOSE, CAPILLARY
Glucose-Capillary: 101 mg/dL — ABNORMAL HIGH (ref 70–99)
Glucose-Capillary: 110 mg/dL — ABNORMAL HIGH (ref 70–99)
Glucose-Capillary: 111 mg/dL — ABNORMAL HIGH (ref 70–99)
Glucose-Capillary: 108 mg/dL — ABNORMAL HIGH (ref 70–99)
Glucose-Capillary: 110 mg/dL — ABNORMAL HIGH (ref 70–99)
Glucose-Capillary: 133 mg/dL — ABNORMAL HIGH (ref 70–99)
Glucose-Capillary: 128 mg/dL — ABNORMAL HIGH (ref 70–99)

## 2023-02-17 LAB — RENAL FUNCTION PANEL
Albumin: 2.8 g/dL — ABNORMAL LOW (ref 3.5–5.0)
Anion gap: 8 (ref 5–15)
BUN: 10 mg/dL (ref 8–23)
CO2: 25 mmol/L (ref 22–32)
Calcium: 8.1 mg/dL — ABNORMAL LOW (ref 8.9–10.3)
Chloride: 98 mmol/L (ref 98–111)
Creatinine, Ser: 0.72 mg/dL (ref 0.44–1.00)
GFR, Estimated: >60 mL/min (ref >60)
Glucose, Bld: 150 mg/dL — ABNORMAL HIGH (ref 70–99)
Phosphorus: 3.9 mg/dL (ref 2.5–4.6)
Potassium: 3.3 mmol/L — ABNORMAL LOW (ref 3.5–5.1)
Sodium: 131 mmol/L — ABNORMAL LOW (ref 135–145)

## 2023-02-17 LAB — MAGNESIUM
Magnesium: 2 mg/dL (ref 1.7–2.4)
Magnesium: 1.7 mg/dL (ref 1.7–2.4)

## 2023-02-17 MED ORDER — ACETAMINOPHEN 325 MG PO TABS: 650.0000 mg | ORAL_TABLET | Freq: Four times a day (QID) | ORAL | Status: DC | PRN

## 2023-02-17 MED ORDER — OXYCODONE HCL 5 MG PO TABS: 10.0000 mg | ORAL_TABLET | Freq: Three times a day (TID) | ORAL | Status: DC

## 2023-02-17 MED ORDER — HYOSCYAMINE SULFATE 0.125 MG PO TBDP
0.1250 mg | ORAL_TABLET | Freq: Two times a day (BID) | ORAL | Status: DC
  Administered 2023-02-17 (×2): 0.125 mg via SUBLINGUAL
  Filled 2023-02-17 (×3): qty 1

## 2023-02-17 MED ORDER — VALPROIC ACID 250 MG/5ML PO SOLN
125.0000 mg | Freq: Two times a day (BID) | ORAL | Status: DC
  Administered 2023-02-17 – 2023-02-19 (×4): 125 mg
  Filled 2023-02-17 (×5): qty 5

## 2023-02-17 MED ORDER — ISOPROTERENOL HCL 0.2 MG/ML IJ SOLN
1.0000 ug/min | INTRAVENOUS | Status: DC
  Administered 2023-02-17 – 2023-02-19 (×3): 1 ug/min via INTRAVENOUS
  Filled 2023-02-17 (×3): qty 5

## 2023-02-17 MED ORDER — MAGNESIUM SULFATE 2 GM/50ML IV SOLN
2.0000 g | Freq: Once | INTRAVENOUS | Status: AC
  Administered 2023-02-17: 2 g via INTRAVENOUS
  Filled 2023-02-17: qty 50

## 2023-02-17 MED ORDER — FUROSEMIDE 20 MG PO TABS
20.0000 mg | ORAL_TABLET | Freq: Every day | ORAL | Status: DC
  Administered 2023-02-18 – 2023-02-19 (×2): 20 mg
  Filled 2023-02-17 (×2): qty 1

## 2023-02-17 MED ORDER — VORTIOXETINE HBR 5 MG PO TABS
20.0000 mg | ORAL_TABLET | Freq: Every day | ORAL | Status: DC
  Administered 2023-02-17 – 2023-02-19 (×3): 20 mg
  Filled 2023-02-17 (×3): qty 4

## 2023-02-17 MED ORDER — ACETAMINOPHEN 650 MG RE SUPP: 650.0000 mg | Freq: Four times a day (QID) | RECTAL | Status: DC | PRN

## 2023-02-17 MED ORDER — POTASSIUM CHLORIDE 20 MEQ PO PACK
20.0000 meq | PACK | Freq: Once | ORAL | Status: AC
  Administered 2023-02-17: 20 meq
  Filled 2023-02-17: qty 1

## 2023-02-17 MED ORDER — POTASSIUM CHLORIDE 10 MEQ/100ML IV SOLN
10.0000 meq | INTRAVENOUS | Status: AC
  Administered 2023-02-17 (×6): 10 meq via INTRAVENOUS
  Filled 2023-02-17 (×12): qty 100

## 2023-02-17 MED ORDER — OXYCODONE HCL 5 MG PO TABS: 5.0000 mg | ORAL_TABLET | ORAL | Status: DC | PRN

## 2023-02-17 MED ORDER — POTASSIUM CHLORIDE IN NACL 40-0.9 MEQ/L-% IV SOLN
INTRAVENOUS | Status: DC
  Filled 2023-02-17 (×5): qty 1000

## 2023-02-17 MED ORDER — PREGABALIN 75 MG PO CAPS
100.0000 mg | ORAL_CAPSULE | Freq: Two times a day (BID) | ORAL | Status: DC
  Administered 2023-02-18 – 2023-02-19 (×2): 100 mg
  Filled 2023-02-17 (×2): qty 1

## 2023-02-17 MED ORDER — LEVOTHYROXINE SODIUM 100 MCG PO TABS
100.0000 ug | ORAL_TABLET | Freq: Every day | ORAL | Status: DC
  Administered 2023-02-19: 100 ug
  Filled 2023-02-17: qty 1

## 2023-02-17 NOTE — Progress Notes (Signed)
At approximately 1415 patient started having intermittent v-fib runs along with v-tach episodes with two occurences of v-fib reaching a heart rate in the 200s.  Dr. Sharlot Gowda notified and ordered stat BMP, KCL in NS at 166mL/hr, 2gm Mag IV x1, Kcl OG x 2, discontinue hyoscyamine, increase propofal, and come down off the fentanyl gtt. EKG was also obtained.  Patient's son, [name], notified and came to bedside to see patient and also spoke with palliative.  Patient continues with intermittent v-tach and v-fib episodes that have improved in occurrence.  Will endorse to oncoming shift.

## 2023-02-17 NOTE — Progress Notes (Signed)
Attending Progress Note  History: [name] is here for a presentation of A-fib with RVR, she was being treated with anticoagulation and unfortunately developed a large left-sided subdural hematoma causing an acute change in her level of consciousness.  She was taken to the operating room yesterday for a left-sided craniotomy for hematoma evacuation.  She was admitted to the postoperative ICU afterwards.  Postoperative day 1: Had a head CT which showed significant decompression of her hematoma.  Improved midline shift.  She is requiring higher levels of sedation.   Physical Exam: Vitals:   02/17/23 0930 02/17/23 1000  BP: 100/61 (!) 94/59  Pulse: 64 63  Resp: 18 18  Temp: (!) 100.4 F (38 C) (!) 100.6 F (38.1 C)  SpO2: 100% 100%    Currently intubated and sedated.  She does awaken to voice, moves spontaneously and withdraws from pain.  Her pupils are round and reactive.  GCS is 8T  JP drain put out 70 overnight  Data:  Recent Labs  Lab 02/16/23 1239 02/16/23 2156 02/17/23 0522  NA 130* 130* 131*  K 3.6 3.8 3.3*  CL 94* 97* 98  CO2 23 24 25   BUN 11 10 10   CREATININE 0.81 0.81 0.72  GLUCOSE 203* 161* 150*  CALCIUM 8.5* 7.9* 8.1*   Recent Labs  Lab 02/16/23 1239  AST 21  ALT 11  ALKPHOS 87     Recent Labs  Lab 02/16/23 1241 02/16/23 2156 02/17/23 0522  WBC 15.8* 12.6* 12.4*  HGB 11.6* 10.1* 9.8*  HCT 36.1 31.0* 30.4*  PLT 227 235 190   Recent Labs  Lab 02/13/23 0622 02/17/23 0522  INR 1.2 1.2         Other tests/results:  Narrative & Impression  CLINICAL DATA:  Subdural hematoma, follow-up   EXAM: CT HEAD WITHOUT CONTRAST   TECHNIQUE: Contiguous axial images were obtained from the base of the skull through the vertex without intravenous contrast.   RADIATION DOSE REDUCTION: This exam was performed according to the departmental dose-optimization program which includes automated exposure control, adjustment of the mA and/or kV according  to patient size and/or use of iterative reconstruction technique.   COMPARISON:  02/16/2023 CT head   FINDINGS: Brain: Status post interval left frontoparietal craniotomy for evacuation of the previously noted subdural hematoma. A drain is noted terminating in the left subdural space. Pneumocephalus along the left frontal convexity, with a small subdural collection measuring up to 6 mm (series 2, image 17). Hemorrhage is again noted along the anterior falx, measuring to 10 mm, previously 14 mm; along the posterior falx, measuring up to 8 mm, previously 8 mm; an along the left tentorium, measuring up to 7 mm, previously 8 mm.   8 mm left to right midline shift, previously 17 mm when remeasured similarly. Decreased mass effect on the lateral and third ventricles. The basilar cisterns are patent.   No evidence of acute infarct, parenchymal hemorrhage, or mass. No hydrocephalus. Unchanged mixed density subdural collection over the right frontal convexity, which measures up to 6 mm, unchanged.   Vascular: No hyperdense vessel.   Skull: Left frontotemporal craniotomy. Superficial skin staples. Air in the soft tissues is not unexpected postoperatively.   Sinuses/Orbits: Clear paranasal sinuses. Status post bilateral lens replacements.   Other: The mastoids are well aerated.   IMPRESSION: 1. Status post left frontoparietal craniotomy for evacuation of the previously noted left subdural hematoma, with decreased midline shift, now 8 mm, and decreased mass effect on the  lateral and third ventricles. 2. Pneumocephalus and a small subdural collection along the left frontal convexity, with stable to slightly decreased hematomas along the falx and tentorium. 3. Unchanged mixed density subdural collection over the right frontal convexity, which measures up to 6 mm.     Electronically Signed   By: Wiliam Ke M.D.   On: 02/16/2023 21:45     Assessment/Plan:  [name] is a  77 year old woman with a history of A-fib with RVR who was admitted for anticoagulation/antiplatelet therapy.  She had a spontaneous loss of consciousness and upon workup was found to have a massive left-sided subdural hematoma.  Her preoperative neuroexam was GCS 3 T but she did have pupillary responses.  Family elected to move forward with aggressive management and therefore we took her to the OR for craniotomy for evacuation of subdural hematoma.  She was admitted to the ICU postoperatively.  -Her repeat head CT shows good decompression with improvement in her midline shift.  She does have some postoperative pneumocephalus which is expected.  Midline blood was also not approached given the morbidity associated with its removal.  When she is stable from a cardiac standpoint would like to obtain another head CT, however this is not urgent and can be done tomorrow if necessary  -Please continue to follow her drain output and continue prophylactic antibiotics.  -Head position as tolerated  -Continue to follow pupillary exam with vitals, and extremity exam every 4-8 hours.  Lovenia Kim, MD/MSCR Department of Neurosurgery

## 2023-02-17 NOTE — Consult Note (Signed)
PHARMACY CONSULT NOTE  Pharmacy Consult for Electrolyte Monitoring and Replacement   Recent Labs: Potassium (mmol/L)  Date Value  02/17/2023 3.3 (L)   Magnesium (mg/dL)  Date Value  16/06/9603 2.0   Calcium (mg/dL)  Date Value  54/05/8118 8.1 (L)   Albumin (g/dL)  Date Value  14/78/2956 2.8 (L)  03/31/2022 4.0   Phosphorus (mg/dL)  Date Value  21/30/8657 3.9   Sodium (mmol/L)  Date Value  02/17/2023 131 (L)  03/31/2022 135   Assessment: 77 y/o F with medical history including seizures, hypothyroidism, depression, IBS admitted with fall complicated by subdural hematoma. Neurosurgery performed frontotemporoparietal craniotomy for evacuation of hematoma. Pharmacy consulted to assist with electrolyte monitoring and replacement as indicated.  Diet: NPO  Goal of Therapy:  Electrolytes within normal limits  Plan:  --K 3.3, Kcl 10 mEq IV x 6 ordered for today --Will follow-up electrolytes with AM labs tomorrow  Tressie Ellis 02/17/2023 8:06 AM

## 2023-02-17 NOTE — Progress Notes (Signed)
Patient so far this shift has had intermittent runs of v-tach of up to 25 beats during interval. Defibrillator pads placed and EKG obtained. Cardiology notified and ordered electrolyte replacement.  Will continue to monitor patient.

## 2023-02-17 NOTE — Progress Notes (Signed)
PT Cancellation Note  Patient Details Name: [name] MRN: [id number] DOB: 01-08-1946   Cancelled Treatment:    Reason Eval/Treat Not Completed: Other (comment). PT to sign off due to change in medical status. Please re-consult when patient is medically appropriate.    Olga Coaster PT, DPT 7:59 AM,02/17/23

## 2023-02-17 NOTE — Progress Notes (Addendum)
Cardiology Progress Note   Patient Name: [name] Date of Encounter: 02/17/2023  Primary Cardiologist: None  Subjective   Intubated, sedated.  Briefly opens eyes to name.  Inpatient Medications    Scheduled Meds:  Chlorhexidine Gluconate Cloth  6 each Topical Q0600   cholecalciferol  2,000 Units Oral Daily   divalproex  125 mg Oral BID   docusate  100 mg Per Tube BID   fentaNYL (SUBLIMAZE) injection  100 mcg Intravenous Once   furosemide  20 mg Oral Daily   hyoscyamine  0.125 mg Oral BID   insulin aspart  0-15 Units Subcutaneous Q4H   levothyroxine  100 mcg Oral Daily   lidocaine  1 patch Transdermal Daily   mouth rinse  15 mL Mouth Rinse Q2H   oxyCODONE  10 mg Oral Q8H   pantoprazole (PROTONIX) IV  40 mg Intravenous Daily   polyethylene glycol  17 g Per Tube Daily   pregabalin  100 mg Oral BID   QUEtiapine  50 mg Oral QHS   ramelteon  8 mg Oral QHS   vortioxetine HBr  20 mg Oral Daily   Continuous Infusions:  sodium chloride 10 mL/hr at 02/16/23 1900    ceFAZolin (ANCEF) IV 2 g (02/17/23 0518)   fentaNYL infusion INTRAVENOUS 100 mcg/hr (02/16/23 1533)   mannitol 20% IV solution (preferred)     norepinephrine (LEVOPHED) Adult infusion 4 mcg/min (02/17/23 0553)   potassium chloride     propofol (DIPRIVAN) infusion 15 mcg/kg/min (02/17/23 0553)   PRN Meds: acetaminophen **OR** acetaminophen, fentaNYL, hydrocortisone cream, iohexol, midazolam, mouth rinse, mouth rinse, oxyCODONE, SUMAtriptan, traZODone   Vital Signs    Vitals:   02/17/23 0518 02/17/23 0600 02/17/23 0700 02/17/23 0730  BP:  99/64 108/67 93/63  Pulse:  65 68 66  Resp:  18 18 18   Temp:  (!) 100.4 F (38 C) 99.7 F (37.6 C) 99.7 F (37.6 C)  TempSrc:   Esophageal Esophageal  SpO2:  100% 100% 99%  Weight: 100.3 kg     Height:        Intake/Output Summary (Last 24 hours) at 02/17/2023 0807 Last data filed at 02/17/2023 0553 Gross per 24 hour  Intake 1976.25 ml  Output 1445 ml  Net  531.25 ml   Filed Weights   02/15/23 0746 02/16/23 1350 02/17/23 0518  Weight: 99.4 kg 98.5 kg 100.3 kg    Physical Exam   GEN: Well nourished, well developed, intubated, sedated. HEENT: Grossly normal.  Neck: Supple, no JVD, carotid bruits, or masses. Cardiac: RRR, no murmurs, rubs, or gallops. No clubbing, cyanosis, trace ankle edema.  Radials 2+, DP/PT 2+ and equal bilaterally.  Respiratory:  Respirations regular and unlabored, diminished breath sounds bilat. GI: Soft, nontender, nondistended, BS + x 4. MS: no deformity or atrophy. Skin: warm and dry, no rash. Neuro:  sedated Psych: sedated  Labs    Chemistry Recent Labs  Lab 02/16/23 1239 02/16/23 2156 02/17/23 0522  NA 130* 130* 131*  K 3.6 3.8 3.3*  CL 94* 97* 98  CO2 23 24 25   GLUCOSE 203* 161* 150*  BUN 11 10 10   CREATININE 0.81 0.81 0.72  CALCIUM 8.5* 7.9* 8.1*  PROT 7.1  --   --   ALBUMIN 3.3*  --  2.8*  AST 21  --   --   ALT 11  --   --   ALKPHOS 87  --   --   BILITOT 0.6  --   --  GFRNONAA >60 >60 >60  ANIONGAP 13 9 8      Hematology Recent Labs  Lab 02/16/23 1241 02/16/23 2156 02/17/23 0522  WBC 15.8* 12.6* 12.4*  RBC 4.47 3.80* 3.72*  HGB 11.6* 10.1* 9.8*  HCT 36.1 31.0* 30.4*  MCV 80.8 81.6 81.7  MCH 26.0 26.6 26.3  MCHC 32.1 32.6 32.2  RDW 15.9* 15.9* 15.9*  PLT 227 235 190    Cardiac Enzymes  Recent Labs  Lab 02/07/23 2227 02/08/23 0047 02/16/23 1242  TROPONINIHS 9 10 64*      BNP    Component Value Date/Time   BNP 804.3 (H) 02/07/2023 1933    ProBNP    Component Value Date/Time   PROBNP 158.40 (H) 03/03/2013 1637    Lipids  Lab Results  Component Value Date   CHOL 179 02/04/2019   HDL 83 02/04/2019   LDLCALC 78 02/04/2019   LDLDIRECT 91 03/04/2021   TRIG 94 02/17/2023   CHOLHDL 2.2 02/04/2019    HbA1c  Lab Results  Component Value Date   HGBA1C 6.0 (H) 03/31/2022    Radiology    DG Chest Port 1 View  Result Date: 02/17/2023 CLINICAL DATA:   [id number] Acute respiratory failure with hypoxia (HCC) 045409 EXAM: PORTABLE CHEST - 1 VIEW COMPARISON:  the previous day's study FINDINGS: This endotracheal tube, gastric tube, and left IJ central line stable in position. Relatively low lung volumes as before with some increase in scattered perihilar and bibasilar interstitial airspace opacities. Heart size upper limits normal. Suspect small left pleural effusion, new since previous. Post right shoulder arthroplasty. Peripherally calcified breast implants. Cement augmentation in an upper lumbar vertebral body. IMPRESSION: 1. Low lung volumes with increasing perihilar and bibasilar interstitial opacities. 2. Suspect small left pleural effusion. Electronically Signed   By: Corlis Leak M.D.   On: 02/17/2023 07:46   CT HEAD WO CONTRAST ( )  Result Date: 02/16/2023 CLINICAL DATA:  Subdural hematoma, follow-up EXAM: CT HEAD WITHOUT CONTRAST TECHNIQUE: Contiguous axial images were obtained from the base of the skull through the vertex without intravenous contrast. RADIATION DOSE REDUCTION: This exam was performed according to the departmental dose-optimization program which includes automated exposure control, adjustment of the mA and/or kV according to patient size and/or use of iterative reconstruction technique. COMPARISON:  02/16/2023 CT head FINDINGS: Brain: Status post interval left frontoparietal craniotomy for evacuation of the previously noted subdural hematoma. A drain is noted terminating in the left subdural space. Pneumocephalus along the left frontal convexity, with a small subdural collection measuring up to 6 mm (series 2, image 17). Hemorrhage is again noted along the anterior falx, measuring to 10 mm, previously 14 mm; along the posterior falx, measuring up to 8 mm, previously 8 mm; an along the left tentorium, measuring up to 7 mm, previously 8 mm. 8 mm left to right midline shift, previously 17 mm when remeasured similarly. Decreased mass effect on  the lateral and third ventricles. The basilar cisterns are patent. No evidence of acute infarct, parenchymal hemorrhage, or mass. No hydrocephalus. Unchanged mixed density subdural collection over the right frontal convexity, which measures up to 6 mm, unchanged. Vascular: No hyperdense vessel. Skull: Left frontotemporal craniotomy. Superficial skin staples. Air in the soft tissues is not unexpected postoperatively. Sinuses/Orbits: Clear paranasal sinuses. Status post bilateral lens replacements. Other: The mastoids are well aerated. IMPRESSION: 1. Status post left frontoparietal craniotomy for evacuation of the previously noted left subdural hematoma, with decreased midline shift, now 8 mm, and decreased mass effect on  the lateral and third ventricles. 2. Pneumocephalus and a small subdural collection along the left frontal convexity, with stable to slightly decreased hematomas along the falx and tentorium. 3. Unchanged mixed density subdural collection over the right frontal convexity, which measures up to 6 mm. Electronically Signed   By: Wiliam Ke M.D.   On: 02/16/2023 21:45   DG Abd 1 View  Result Date: 02/16/2023 CLINICAL DATA:  Placement of enteric tube EXAM: ABDOMEN - 1 VIEW COMPARISON:  09/13/2004 FINDINGS: Bowel gas pattern is nonspecific. There is moderate gaseous distention of transverse colon. Tip of enteric tube is seen in the region of the antrum of the stomach. There is contrast in the pelvocaliceal systems related to previous CT angiogram. There is kyphoplasty in lower thoracic spine, possibly T12 vertebra. There is evidence of previous ventral hernia repair. IMPRESSION: Tip of the enteric tube is seen in the region of antrum of the stomach. Electronically Signed   By: Ernie Avena M.D.   On: 02/16/2023 15:29   DG Chest Port 1 View  Result Date: 02/16/2023 CLINICAL DATA:  Difficulty breathing, status post intubation EXAM: PORTABLE CHEST 1 VIEW COMPARISON:  Previous studies  including the examination none on 02/07/2023 FINDINGS: Transverse diameter of heart is increased. The central pulmonary vessels are more prominent. Increased interstitial markings are seen in both lungs, more so on the right side. There is blunting of both lateral CP angles. There is no pneumothorax. There is interval placement of endotracheal tube with its tip 4 cm above the carina. There is interval placement of left IJ central venous catheter with its tip in superior vena cava. There is interval placement of enteric tube with its distal portion in the stomach. There is previous reverse arthroplasty right shoulder. There is dense calcification in bilateral breast implants. IMPRESSION: Cardiomegaly. Increased interstitial and alveolar markings are seen in both lungs, more so on the right side. Findings may suggest asymmetric pulmonary edema or multifocal pneumonia. Possible small bilateral pleural effusions. Support devices as described. Electronically Signed   By: Ernie Avena M.D.   On: 02/16/2023 15:27   CT ANGIO HEAD NECK W WO CM (CODE STROKE)  Result Date: 02/16/2023 CLINICAL DATA:  Neuro deficit, acute, stroke suspected EXAM: CT ANGIOGRAPHY HEAD AND NECK WITH AND WITHOUT CONTRAST TECHNIQUE: Multidetector CT imaging of the head and neck was performed using the standard protocol during bolus administration of intravenous contrast. Multiplanar CT image reconstructions and MIPs were obtained to evaluate the vascular anatomy. Carotid stenosis measurements (when applicable) are obtained utilizing NASCET criteria, using the distal internal carotid diameter as the denominator. RADIATION DOSE REDUCTION: This exam was performed according to the departmental dose-optimization program which includes automated exposure control, adjustment of the mA and/or kV according to patient size and/or use of iterative reconstruction technique. CONTRAST:  75mL OMNIPAQUE IOHEXOL 350 MG/ML SOLN COMPARISON:  Same day CT head  FINDINGS: CT HEAD FINDINGS See same day CT brain for intracranial findings. Note that the presence of iodinated contrast limits the ability to assess for intracranial hemorrhage. Redemonstrated is a large left cerebral convexity subdural hematoma measuring up to 2.2 cm with 1.5 cm rightward midline shift. The size of the right temporal horn has increased compared to recent prior head CT dated 06/24, which raises the possibility for ventricular entrapment. There is evidence of left-sided uncal herniation. Subdural blood products extending along the falx, left tentorial leaflet, and are also seen along the right frontal convexity (series 5, image 41). CTA NECK FINDINGS Aortic arch:  Standard branching. Imaged portion shows no evidence of aneurysm or dissection. No significant stenosis of the major arch vessel origins. Right carotid system: No evidence of dissection, stenosis (50% or greater), or occlusion. Left carotid system: No evidence of dissection, stenosis (50% or greater), or occlusion. Vertebral arteries: Codominant. No evidence of dissection, stenosis (50% or greater), or occlusion. Skeleton: Negative. Other neck: Negative. Upper chest: Partially imaged bilateral breast implants. The main pulmonary artery measures up to 3.4 cm, which can be seen in the setting of pulmonary artery hypertension. Biapical ground-glass opacities are favored to be infectious or inflammatory. Right shoulder arthroplasty. The esophagus is patulous, which places the patient at risk for aspiration. Review of the MIP images confirms the above findings CTA HEAD FINDINGS Anterior circulation: No significant stenosis, proximal occlusion, aneurysm, or vascular malformation. Posterior circulation: No significant stenosis, proximal occlusion, aneurysm, or vascular malformation. Venous sinuses: As permitted by contrast timing, patent. Anatomic variants: Fetal PCA on the left. Review of the MIP images confirms the above findings IMPRESSION: 1.  No intracranial large vessel occlusion or significant stenosis. 2. No hemodynamically significant stenosis in the neck. 3. Redemonstrated is a large left cerebral convexity subdural hematoma measuring up to 2.2 cm with 1.5 cm rightward midline shift. The size of the right temporal horn has increased compared to recent prior head CT dated 06/24, which raises the possibility for early ventricular entrapment. There is evidence of left-sided uncal herniation. 4. Subdural blood products extending along the falx, left tentorial leaflet, and are also seen along the right frontal convexity. 5. The main pulmonary artery measures up to 3.4 cm, which can be seen in the setting of pulmonary artery hypertension. 6. Biapical ground-glass opacities are favored to be infectious or inflammatory. 7. The esophagus is patulous, which places the patient at risk for aspiration. Electronically Signed   By: Lorenza Cambridge M.D.   On: 02/16/2023 13:55   CT HEAD WO CONTRAST ( )  Result Date: 02/16/2023 CLINICAL DATA:  Headache, increasing frequency or severity. EXAM: CT HEAD WITHOUT CONTRAST TECHNIQUE: Contiguous axial images were obtained from the base of the skull through the vertex without intravenous contrast. RADIATION DOSE REDUCTION: This exam was performed according to the departmental dose-optimization program which includes automated exposure control, adjustment of the mA and/or kV according to patient size and/or use of iterative reconstruction technique. COMPARISON:  Head CT 02/07/2023 FINDINGS: Brain: A new acute large subdural hematoma over the left cerebral convexity measures up to 2.1 cm in thickness. There is prominent mass effect on the left cerebral hemisphere with partial effacement of the lateral and third ventricles, 1.6 cm of rightward midline shift, and left uncal herniation. Mild dilatation of the right lateral ventricle may reflect early trapping. Subdural hemorrhage extending along the falx measures up to 1.5 cm  in thickness, and subdural hemorrhage extending along the left tentorium measures up to 0.6 cm in thickness. A new small mixed density subdural hematoma anteriorly over the right frontal convexity measures 0.5 cm in thickness. 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 No acute infarct is identified. Vascular: Calcified atherosclerosis at the skull base. Skull: No acute fracture or suspicious osseous lesion. Sinuses/Orbits: Visualized paranasal sinuses in mastoid air cells are clear. Bilateral cataract extraction. Other: None. Critical Value/emergent results were called by telephone at the time of interpretation on 02/16/2023 at 1:23 pm to Dr. Marcelino Duster, who verbally acknowledged these results. IMPRESSION: New large left and small right subdural hematomas with mass effect  as above, including 1.6 cm of rightward midline shift and left uncal herniation. Electronically Signed   By: Sebastian Ache M.D.   On: 02/16/2023 13:25    Telemetry    Sinus rhythm, prolonged qt, brief run of svt, occas PVC/couplet/triplet. Brief run of polymorphic VT - Personally Reviewed  ECG    RSR, 70, diff ST dep/TWI, QTc 595 - Personally Reviewed  Cardiac Studies   Transesophageal Echocardiogram & Cardioversion 6.11.2024  1. Left ventricular ejection fraction, by estimation, is 50 to 55%. The  left ventricle has low normal function. The left ventricle has no regional  wall motion abnormalities.   2. Right ventricular systolic function is low normal. The right  ventricular size is normal.   3. Left atrial size was moderately dilated. No left atrial/left atrial  appendage thrombus was detected.   4. Right atrial size was moderately dilated.   5. The mitral valve is normal in structure. Moderate mitral valve  regurgitation. No evidence of mitral stenosis.   6. The aortic valve is tricuspid. Aortic valve regurgitation is mild to  moderate. No aortic stenosis  is present.   7. The inferior vena cava is normal in size with greater than 50%  respiratory variability, suggesting right atrial pressure of 3 mmHg.   8. Agitated saline contrast bubble study was positive with shunting  observed within 3-6 cardiac cycles suggestive of interatrial shunt. There  is a small patent foramen ovale with predominantly right to left shunting  across the atrial septum.   Cardioverted 1 time(s).   Cardioverted at  150 J. Synchronized biphasic Converted to NSR _____________   Patient Profile     77 y.o. female with history of obesity, seizures, hypothyroidism, mechanical falls, iron deficiency anemia, anxiety/panic attacks, admitted 6/6 following fall w/ finding of AF RVR and hypotension.  CT head neg.  S/p TEE/DCCV 6/11. Severe headache 6/14  Large L, small R SDH w/ midline shift  Eliquis reversed  S/p craniotomy and subgaleal drain.  Assessment & Plan    1.  Subdural Hematoma:  C/o headache 6/14.  Developed AMS  CT head w/ large L and small R SDH  Eliquis reversed  Now s/p craniotomy and subgaleal drain.  Remains intubated, hypotensive on norepi.  QTc prolongation in setting of SDH/hypokalemia (see below).  Repeat CT pending once hemodynamically stable.  2.  Polymorphic VT/Prolonged QT:  Pt w/ several runs of polymorphic VT this AM, up to 29 beats.  QTc 595 in setting of SDH, hypokalemia (3.3), and mild hypocalcemia (8.1).  Mg pending, though was 2.0 last night.  K upplementation ordered.  Avoid QT prolonging meds - d/c seroquel, trazodone, and sumatriptan (has not received in > 24 hrs)  Zoll @ bedside.  3.  Hypokalemia:  K 3.3.  supplementation ordered.  Mg 2.0 last night, pending this AM.  4.  Afib RVR:  on presentation.  S/p TEE/DCCV 6/11.  Maintaining sinus rhythm.  Anticoagulation reversed and held in setting of #1.  5.  Hypotension/shock:  Pressors per CCM.  6.  Low grade fever:  Abx per IM.  Signed, Nicolasa Ducking, NP  02/17/2023, 8:07 AM    For  questions or updates, please contact   Please consult [email or web address] for contact info under Cardiology/STEMI.  Cardiology Attending  Patient seen and examined.  Agree with the findings as noted above.  Patient is a pleasant 77 year old woman who presented with subdural hemorrhage and has undergone evacuation with a left frontal drain placed.  She has markedly  prolongation of the QT interval.  Her QT is approximately 600.  The patient's neurologic status is such that she awakens to verbal and physical stimulation.  Does not answer questions.  Lungs are clear with scattered rales in the bases.  Cardiovascular exam reveals regular rate and rhythm.  Extremities are warm.  Abdominal exam soft.  Telemetry demonstrates sinus rhythm as noted above with PVCs and marked QT prolongation. Assessment and plan 1.  Polymorphic ventricular tachycardia in the setting of long QT -avoid QT prolonging medications.  Replete electrolytes including potassium.  Goal for potassium is above 5.  Prognosis is guarded. 2.  Subdural hematoma -she is status post evacuation.  CT scan reviewed. 3.  Atrial fibrillation -she is maintaining sinus rhythm.  She is off all anticoagulation.  Lewayne Bunting, MD

## 2023-02-17 NOTE — Progress Notes (Signed)
Patient's condition is too unstable to safely transport to CT and have scan.  Dr. Katrinka Blazing aware and will notify when patient condition stabilizes and can safely transfer to CT.

## 2023-02-17 NOTE — Plan of Care (Signed)
Continuing with plan of care. 

## 2023-02-17 NOTE — Progress Notes (Signed)
CRITICAL CARE PROGRESS NOTE    Name: [name] MRN: [id number] DOB: 06/20/1946     LOS: 9   SUBJECTIVE FINDINGS & SIGNIFICANT EVENTS    History of Presenting Illness:  This is a 77 year old female with a history of atrial fibrillation with rapid ventricular response, hypothyroidism, epilepsy, anxiety and depression, history of recurrent falls on multiple centrally acting medications came in with findings of atrial fibrillation rapid ventricular response was initially seen by cardiology medically treated placed on blood thinners with Eliquis underwent transesophageal echo with cardioversion.  She is being treated for diastolic CHF as well has UTI.  She does have a history of chronic diarrhea which was also managed on the floors.  Initially had some improvement symptomatically was being diuresed on the floor then suddenly developed headaches and was treated with narcotics for this.  She had CT head performed which showed subdural hematoma and patient was brought in to medical intensive care unit lethargic unable to protect airway with active intracranial bleeding.  Multiple consultants were contacted including neurosurgery and plan is made to take patient to the operating room for bur hole procedure.   02/17/23- S/P neurosurgery JP drain in place with active drain.  CT head with interval improvement. VT this am s/p cardio eval. We have been in communication with cardiology throughout day with repeated treatments for arrythmia.  No SBT today per neurosurgery. She is on propofol and fentantyl, making adequate urine.    Lines/tubes : Airway 8 mm (Active)  Secured at (cm) 23 cm 02/16/23 1405  Measured From Lips 02/16/23 1405  Secured By [employer] 02/16/23 1405  Cuff Pressure (cm H2O) Green OR 18-26 [facility]  02/16/23 1405     External Urinary Catheter (Active)  Collection Container Dedicated Suction Canister 02/16/23 0800  Suction (Verified suction is between 40-80 mmHg) Yes 02/16/23 0800  Securement Method None needed 02/16/23 0800  Site Assessment Clean, Dry, Intact 02/16/23 0800  Intervention Other (Comment) 02/16/23 0800  Output (mL) 650 mL 02/16/23 0032    Microbiology/Sepsis markers: Results for orders placed or performed during the hospital encounter of 02/07/23  Blood Culture (routine x 2)     Status: None   Collection Time: 02/07/23  7:27 PM   Specimen: BLOOD  Result Value Ref Range Status   Specimen Description BLOOD BLOOD LEFT FOREARM  Final   Special Requests   Final    BOTTLES DRAWN AEROBIC AND ANAEROBIC Blood Culture adequate volume   Culture   Final    NO GROWTH 5 DAYS Performed at [facility], [address] [place], [place] [address]    Report Status 02/12/2023 FINAL  Final  Blood Culture (routine x 2)     Status: None   Collection Time: 02/07/23  7:32 PM   Specimen: BLOOD  Result Value Ref Range Status   Specimen Description BLOOD BLOOD RIGHT ARM  Final   Special Requests   Final    BOTTLES DRAWN AEROBIC AND ANAEROBIC Blood Culture results may not be optimal due to an excessive volume of blood received in culture bottles   Culture   Final    NO GROWTH 5 DAYS Performed at [facility], [address], [place], [place] [address]    Report Status 02/12/2023 FINAL  Final  Urine Culture     Status: Abnormal   Collection Time: 02/07/23  8:47 PM   Specimen: Urine, Random  Result Value Ref Range Status   Specimen Description   Final  URINE, RANDOM Performed at [facility], [address] [place], [place] [address]    Special Requests   Final    NONE Reflexed from [id number] Performed at [facility], [address] [place], [place] [address]    Culture (A)  Final    >=100,000 COLONIES/mL AEROCOCCUS  SPECIES Standardized susceptibility testing for this organism is not available. Performed at [facility], [address] [place], [place] [address]    Report Status 02/09/2023 FINAL  Final  MRSA Next Gen by PCR, Nasal     Status: Abnormal   Collection Time: 02/16/23  2:01 PM   Specimen: Nasal Mucosa; Nasal Swab  Result Value Ref Range Status   MRSA by PCR Next Gen DETECTED (A) NOT DETECTED Final    Comment: RESULT CALLED TO, READ BACK BY AND VERIFIED WITH: KELLY ICNHAER AT 1751 ON 02/16/23 BY SS (NOTE) The GeneXpert MRSA Assay (FDA approved for NASAL specimens only), is one component of a comprehensive MRSA colonization surveillance program. It is not intended to diagnose MRSA infection nor to guide or monitor treatment for MRSA infections. Test performance is not FDA approved in patients less than 81 years old. Performed at [facility], [address] [place], [place] [address]   Culture, blood (Routine X 2) w Reflex to ID Panel     Status: None (Preliminary result)   Collection Time: 02/16/23  6:39 PM   Specimen: BLOOD  Result Value Ref Range Status   Specimen Description BLOOD BLOOD RIGHT ARM  Final   Special Requests   Final    BOTTLES DRAWN AEROBIC AND ANAEROBIC Blood Culture adequate volume   Culture   Final    NO GROWTH < 12 HOURS Performed at [facility], [address], [place], [place] [address]    Report Status PENDING  Incomplete  Culture, blood (Routine X 2) w Reflex to ID Panel     Status: None (Preliminary result)   Collection Time: 02/16/23  6:56 PM   Specimen: BLOOD  Result Value Ref Range Status   Specimen Description BLOOD BLOOD LEFT ARM  Final   Special Requests   Final    BOTTLES DRAWN AEROBIC AND ANAEROBIC Blood Culture adequate volume   Culture   Final    NO GROWTH < 12 HOURS Performed at [facility], [address] [place], [place] [address]    Report Status PENDING  Incomplete    Anti-infectives:   Anti-infectives (From admission, onward)    Start     Dose/Rate Route Frequency Ordered Stop   02/17/23 0000  ceFAZolin (ANCEF) IVPB 2g/100 mL premix        2 g 200 mL/hr over 30 Minutes Intravenous Every 8 hours 02/16/23 1917     02/12/23 2200  cephALEXin (KEFLEX) capsule 500 mg        500 mg Oral Every 12 hours 02/12/23 1437 02/12/23 2226   02/09/23 1330  cephALEXin (KEFLEX) capsule 500 mg  Status:  Discontinued        500 mg Oral Every 12 hours 02/09/23 1235 02/12/23 1437   02/07/23 2215  cefTRIAXone (ROCEPHIN) 2 g in sodium chloride 0.9 % 100 mL IVPB  Status:  Discontinued        2 g 200 mL/hr over 30 Minutes Intravenous Every 24 hours 02/07/23 2209 02/09/23 1235   02/07/23 2030  ceFEPIme (MAXIPIME) 2 g in sodium chloride 0.9 % 100 mL IVPB        2  g 200 mL/hr over 30 Minutes Intravenous  Once 02/07/23 2017 02/07/23 2130   02/07/23 2030  metroNIDAZOLE (FLAGYL) IVPB 500 mg  Status:  Discontinued        500 mg 100 mL/hr over 60 Minutes Intravenous  Once 02/07/23 2017 02/07/23 2018   02/07/23 2030  vancomycin (VANCOCIN) IVPB 1000 mg/200 mL premix  Status:  Discontinued        1,000 mg 200 mL/hr over 60 Minutes Intravenous  Once 02/07/23 2017 02/07/23 2018        PAST MEDICAL HISTORY   Past Medical History:  Diagnosis Date   Anemia    took Fe- orally 2 yrs. ago    Anxiety    panic attacks    Arthritis    shoulders, knees   Cancer (HCC)    skin- preCA   Depression    GERD (gastroesophageal reflux disease)    uses Protonix as needed    Headache(784.0)    Humerus fracture    Right    Hyposmolality and/or hyponatremia 02/27/2013   Hypothyroidism    IBS (irritable bowel syndrome)    Injury of left foot 09/05/2018   Injury of right hand 04/27/2014   Memory loss    Mental disorder    Migraine    Preoperative examination 02/26/2013   Seizures (HCC)    caused by depression , seritonin seizure - effected short term memory     Shortness of breath      SURGICAL  HISTORY   Past Surgical History:  Procedure Laterality Date   ABDOMINAL HYSTERECTOMY     APPENDECTOMY     BACK SURGERY  1990's   BREAST SURGERY  1972   augmentation    CARDIOVERSION N/A 02/13/2023   Procedure: CARDIOVERSION;  Surgeon: Antonieta Iba, MD;  Location: [facility] ORS;  Service: Cardiovascular;  Laterality: N/A;   CHOLECYSTECTOMY     GASTRECTOMY  1994   Due to ulcer, required multiple revisions.    HERNIA REPAIR     ORIF ANKLE FRACTURE Right 2004   REPLACEMENT TOTAL KNEE     REVERSE SHOULDER ARTHROPLASTY Right 05/16/2013   Procedure: RIGHT HEMI ARTHROPLASTY  VERSES  REVERSE TOTAL SHOULDER ARTHROPLASTY ;  Surgeon: Verlee Rossetti, MD;  Location: [facility] OR;  Service: Orthopedics;  Laterality: Right;   SPINE SURGERY  2005   Lumbar spinal stenosis    STOMACH SURGERY     1/3 resection for obstruction   TEE WITHOUT CARDIOVERSION N/A 02/13/2023   Procedure: TRANSESOPHAGEAL ECHOCARDIOGRAM (TEE);  Surgeon: Antonieta Iba, MD;  Location: [facility] ORS;  Service: Cardiovascular;  Laterality: N/A;   TONSILLECTOMY     TUBAL LIGATION       FAMILY HISTORY   Family History  Problem Relation Age of Onset   Aneurysm Mother    Heart disease Mother    Cancer Father        pancreatic   Stroke Father 64   Hypertension Father    Alcohol abuse Father    Heart disease Maternal Grandfather      SOCIAL HISTORY   Social History   Tobacco Use   Smoking status: Never   Smokeless tobacco: Never  Vaping Use   Vaping Use: Never used  Substance Use Topics   Alcohol use: Yes    Comment: rare use of white wine    Drug use: No     MEDICATIONS   Current Medication:  Current Facility-Administered Medications:    0.9 %  sodium chloride infusion, 250 mL,  Intravenous, Continuous, Harlon Ditty D, NP, Last Rate: 10 mL/hr at 02/16/23 1900, Infusion Verify at 02/16/23 1900   acetaminophen (TYLENOL) tablet 650 mg, 650 mg, Oral, Q6H PRN, 650 mg at 02/14/23 2329 **OR** acetaminophen (TYLENOL)  suppository 650 mg, 650 mg, Rectal, Q6H PRN, Gertha Calkin, MD   ceFAZolin (ANCEF) IVPB 2g/100 mL premix, 2 g, Intravenous, Q8H, Jaynie Bream, RPH, Last Rate: 200 mL/hr at 02/17/23 0518, 2 g at 02/17/23 0518   Chlorhexidine Gluconate Cloth 2 % PADS 6 each, 6 each, Topical, Q0600, Marcelino Duster, MD, 6 each at 02/17/23 0509   cholecalciferol (VITAMIN D3) 25 MCG (1000 UNIT) tablet 2,000 Units, 2,000 Units, Oral, Daily, Pudota, Elsie Ra, MD, 2,000 Units at 02/16/23 0839   divalproex (DEPAKOTE) DR tablet 125 mg, 125 mg, Oral, BID, Pudota, Elsie Ra, MD, 125 mg at 02/16/23 0840   docusate (COLACE) 50 MG/5ML liquid 100 mg, 100 mg, Per Tube, BID, Harlon Ditty D, NP   fentaNYL (SUBLIMAZE) bolus via infusion 25-100 mcg, 25-100 mcg, Intravenous, Q15 min PRN, Harlon Ditty D, NP   fentaNYL (SUBLIMAZE) injection 100 mcg, 100 mcg, Intravenous, Once, Harlon Ditty D, NP   fentaNYL in NS (32mcg/ml) infusion-PREMIX, 0-400 mcg/hr, Intravenous, Continuous, Valoree Agent, MD, Last Rate: 10 mL/hr at 02/16/23 1533, 100 mcg/hr at 02/16/23 1533   furosemide (LASIX) tablet 20 mg, 20 mg, Oral, Daily, Arida, Muhammad A, MD, 20 mg at 02/16/23 1025   hydrocortisone cream 1 % 1 Application, 1 Application, Topical, TID PRN, Marcelino Duster, MD   hyoscyamine (ANASPAZ) disintergrating tablet 0.125 mg, 0.125 mg, Oral, BID, Nazari, Walid A, RPH, 0.125 mg at 02/16/23 0840   insulin aspart (novoLOG) injection 0-15 Units, 0-15 Units, Subcutaneous, Q4H, Harlon Ditty D, NP, 3 Units at 02/16/23 2020   iohexol (OMNIPAQUE) 350 MG/ML injection 75 mL, 75 mL, Intravenous, Once PRN, Caryl Pina, MD   levothyroxine (SYNTHROID) tablet 100 mcg, 100 mcg, Oral, Daily, Pudota, Elsie Ra, MD, 100 mcg at 02/16/23 0526   lidocaine (LIDODERM) 5 % 1 patch, 1 patch, Transdermal, Daily, Pudota, Elsie Ra, MD, 1 patch at 02/13/23 0846   mannitol 20 % infusion 50 g, 0.5 g/kg, Intravenous, Once, Caryl Pina,  MD   midazolam (VERSED) injection 1-2 mg, 1-2 mg, Intravenous, Q1H PRN, Harlon Ditty D, NP   norepinephrine (LEVOPHED) 4mg  in (0.016 mg/mL) premix infusion, 2-10 mcg/min, Intravenous, Titrated, Harlon Ditty D, NP, Last Rate: 15 mL/hr at 02/17/23 0553, 4 mcg/min at 02/17/23 0553   Oral care mouth rinse, 15 mL, Mouth Rinse, PRN, Marcelino Duster, MD   Oral care mouth rinse, 15 mL, Mouth Rinse, Q2H, Amarionna Arca, MD, 15 mL at 02/17/23 0541   Oral care mouth rinse, 15 mL, Mouth Rinse, PRN, Vida Rigger, MD   oxyCODONE (Oxy IR/ROXICODONE) immediate release tablet 10 mg, 10 mg, Oral, Q8H, Pudota, Elsie Ra, MD, 10 mg at 02/16/23 8527   oxyCODONE (Oxy IR/ROXICODONE) immediate release tablet 5 mg, 5 mg, Oral, Q4H PRN, Marcelino Duster, MD, 5 mg at 02/16/23 1159   pantoprazole (PROTONIX) injection 40 mg, 40 mg, Intravenous, Daily, Harlon Ditty D, NP, 40 mg at 02/16/23 1835   polyethylene glycol (MIRALAX / GLYCOLAX) packet 17 g, 17 g, Per Tube, Daily, Harlon Ditty D, NP   potassium chloride 10 mEq in 100 mL IVPB, 10 mEq, Intravenous, Q1 Hr x 6, Berge, Dois Davenport, NP   pregabalin (LYRICA) capsule 100 mg, 100 mg, Oral, BID, Pudota, Elsie Ra, MD, 100 mg at 02/16/23 0840  propofol (DIPRIVAN) 1000 MG/100ML infusion, 5-80 mcg/kg/min, Intravenous, Titrated, Harlon Ditty D, NP, Last Rate: 8.87 mL/hr at 02/17/23 0553, 15 mcg/kg/min at 02/17/23 0553   ramelteon (ROZEREM) tablet 8 mg, 8 mg, Oral, QHS, Pudota, Elsie Ra, MD, 8 mg at 02/15/23 2147   vortioxetine HBr (TRINTELLIX) tablet 20 mg, 20 mg, Oral, Daily, Pudota, Elsie Ra, MD, 20 mg at 02/16/23 0843    ALLERGIES   Bupropion hcl, Other, Penicillins, Tilactase, Azithromycin, and Erythromycin    REVIEW OF SYSTEMS     Unable to perform ROS due to encephalopathy with lethargy  PHYSICAL EXAMINATION   Vital Signs: Temp:  [97 F (36.1 C)-100.4 F (38 C)] 99.7 F (37.6 C) (06/15 0730) Pulse Rate:   [55-111] 66 (06/15 0730) Resp:  [9-26] 18 (06/15 0730) BP: (75-147)/(48-101) 93/63 (06/15 0730) SpO2:  [89 %-100 %] 99 % (06/15 0730) FiO2 (%):  [30 %-50 %] 30 % (06/15 0320) Weight:  [98.5 kg-100.3 kg] 100.3 kg (06/15 0518)  GENERAL: Age-appropriate mild distress due to acute intracranial bleeding HEAD: Normocephalic, atraumatic.  EYES: Pupils equal, round, reactive to light.  No scleral icterus.  MOUTH: Moist mucosal membrane. NECK: Supple. No thyromegaly. No nodules. No JVD.  PULMONARY: Mild rhonchorous lung sounds bilaterally CARDIOVASCULAR: S1 and S2. Regular rate and rhythm. No murmurs, rubs, or gallops.  GASTROINTESTINAL: Soft, nontender, non-distended. No masses. Positive bowel sounds. No hepatosplenomegaly.  MUSCULOSKELETAL: No swelling, clubbing, or edema.  NEUROLOGIC:GCS4T SKIN:intact,warm,dry   PERTINENT DATA     Infusions:  sodium chloride 10 mL/hr at 02/16/23 1900    ceFAZolin (ANCEF) IV 2 g (02/17/23 0518)   fentaNYL infusion INTRAVENOUS 100 mcg/hr (02/16/23 1533)   mannitol 20% IV solution (preferred)     norepinephrine (LEVOPHED) Adult infusion 4 mcg/min (02/17/23 0553)   potassium chloride     propofol (DIPRIVAN) infusion 15 mcg/kg/min (02/17/23 0553)   Scheduled Medications:  Chlorhexidine Gluconate Cloth  6 each Topical Q0600   cholecalciferol  2,000 Units Oral Daily   divalproex  125 mg Oral BID   docusate  100 mg Per Tube BID   fentaNYL (SUBLIMAZE) injection  100 mcg Intravenous Once   furosemide  20 mg Oral Daily   hyoscyamine  0.125 mg Oral BID   insulin aspart  0-15 Units Subcutaneous Q4H   levothyroxine  100 mcg Oral Daily   lidocaine  1 patch Transdermal Daily   mouth rinse  15 mL Mouth Rinse Q2H   oxyCODONE  10 mg Oral Q8H   pantoprazole (PROTONIX) IV  40 mg Intravenous Daily   polyethylene glycol  17 g Per Tube Daily   pregabalin  100 mg Oral BID   ramelteon  8 mg Oral QHS   vortioxetine HBr  20 mg Oral Daily   PRN  Medications: acetaminophen **OR** acetaminophen, fentaNYL, hydrocortisone cream, iohexol, midazolam, mouth rinse, mouth rinse, oxyCODONE Hemodynamic parameters:   Intake/Output: 06/14 0701 - 06/15 0700 In: 1976.3 [I.V.:821.3; IV Piggyback:1155] Out: 1445 [Urine:775; Drains:70; Blood:600]  Ventilator  Settings: Vent Mode: PRVC FiO2 (%):  [30 %-50 %] 30 % Set Rate:  [18 bmp-20 bmp] 18 bmp Vt Set:  [480 mL] 480 mL PEEP:  [5 cmH20] 5 cmH20 Plateau Pressure:  [16 cmH20] 16 cmH20   LAB RESULTS:  Basic Metabolic Panel: Recent Labs  Lab 02/13/23 0622 02/14/23 0342 02/16/23 1239 02/16/23 1241 02/16/23 2156 02/17/23 0522  NA 134* 133* 130*  --  130* 131*  K 4.0 4.1 3.6  --  3.8 3.3*  CL 99 100 94*  --  97* 98  CO2 26 25 23   --  24 25  GLUCOSE 103* 93 203*  --  161* 150*  BUN 13 13 11   --  10 10  CREATININE 0.70 0.73 0.81  --  0.81 0.72  CALCIUM 7.9* 8.1* 8.5*  --  7.9* 8.1*  MG  --   --   --  1.8 2.0  --   PHOS  --   --   --  3.9  --  3.9    Liver Function Tests: Recent Labs  Lab 02/16/23 1239 02/17/23 0522  AST 21  --   ALT 11  --   ALKPHOS 87  --   BILITOT 0.6  --   PROT 7.1  --   ALBUMIN 3.3* 2.8*    No results for input(s): "LIPASE", "AMYLASE" in the last 168 hours. Recent Labs  Lab 02/16/23 1242  AMMONIA 31    CBC: Recent Labs  Lab 02/12/23 0536 02/16/23 0659 02/16/23 1241 02/16/23 2156 02/17/23 0522  WBC 6.8 5.9 15.8* 12.6* 12.4*  HGB 11.5* 10.6* 11.6* 10.1* 9.8*  HCT 36.3 33.0* 36.1 31.0* 30.4*  MCV 81.0 81.5 80.8 81.6 81.7  PLT 158 170 227 235 190    Cardiac Enzymes: No results for input(s): "CKTOTAL", "CKMB", "CKMBINDEX", "TROPONINI" in the last 168 hours. BNP: Invalid input(s): "POCBNP" CBG: Recent Labs  Lab 02/16/23 1351 02/16/23 1928 02/16/23 2325 02/17/23 0304 02/17/23 0730  GLUCAP 170* 187* 117* 101* 110*        ASSESSMENT AND PLAN    -Multidisciplinary rounds held today  Acute subdural hematoma -Patient on  Eliquis, may need reversal agent -Status post endotracheal intubation and mechanical ventilation -Neurosurgery on case-appreciate input to take patient to the OR for neurosurgical intervention -Blood pressure goals per neurology and neurosurgery-can consider hypertonic saline if necessary -Left internal jugular central venous access successfully placed -NSGY on case - please follow recommendations    Chronic diastolic heart failure with atrial fibrillation and rapid ventricular response --recurrent arrythmia today, s/p repletion of electrolytes and cardiology evaluation - appreciate input   Epilepsy -Patient takes depakote at home -currently on general anesthesia, will add as needed   Hypothyroidism  Continue levothyroxine    GI/Nutrition GI PROPHYLAXIS as indicated DIET-->TF's as tolerated Constipation protocol as indicated  ENDO - ICU hypoglycemic\Hyperglycemia protocol -check FSBS per protocol   ELECTROLYTES -follow labs as needed -replace as needed -pharmacy consultation   DVT/GI PRX ordered -SCDs  TRANSFUSIONS AS NEEDED MONITOR FSBS ASSESS the need for LABS as needed    Critical care provider statement:   Total critical care time: 33 minutes   Performed by: Karna Christmas MD   Critical care time was exclusive of separately billable procedures and treating other patients.   Critical care was necessary to treat or prevent imminent or life-threatening deterioration.   Critical care was time spent personally by me on the following activities: development of treatment plan with patient and/or surrogate as well as nursing, discussions with consultants, evaluation of patient's response to treatment, examination of patient, obtaining history from patient or surrogate, ordering and performing treatments and interventions, ordering and review of laboratory studies, ordering and review of radiographic studies, pulse oximetry and re-evaluation of patient's condition.     Vida Rigger, M.D.  Pulmonary & Critical Care Medicine

## 2023-02-17 NOTE — Consult Note (Addendum)
Consultation Note Date: 02/17/2023   Patient Name: [name]  DOB: May 24, 1946  MRN: [id number]  Age / Sex: 77 y.o., female  PCP: Mary Houseman, NP Referring Physician: Vida Rigger, MD  Reason for Consultation: Establishing goals of care   HPI/Brief Hospital Course: 77 y.o. female  with past medical history of seizure disorder, hypothyroidism, anxiety, depression, neuropathy, frequent falls and cognitive impairment admitted from [facility] on 02/07/2023 after a fall and found to be in A. Fib with RVR with hypotension.  Underwent TEE cardioversion 6/11-converted to NSR  Developed headache 6/14-sent for head CT IMPRESSION: New large left and small right subdural hematomas with mass effect as above, including 1.6 cm of rightward midline shift and left uncal herniation.  Underwent craniotomy for evacuation of hematoma-subgaleal drain remains in place to bulb suction Returned to ICU post-op, remains intubated and sedated  6/15 Remains intubated and sedated Opens eyes to calling of her name Frequent runs of v.fib/tach-cardiology following, replacing electrolytes  Palliative medicine was consulted for assisting with goals of care conversations.  Subjective:  Extensive chart review has been completed prior to meeting patient including labs, vital signs, imaging, progress notes, orders, and available advanced directive documents from current and previous encounters.  Met with [name] at her bedside. Remains intubated and sedated. Met with son-[name] outside in family conference room.  Introduced myself as a [name] as a member of the palliative care team. Explained palliative medicine is specialized medical care for people living with serious illness. It focuses on providing relief from the symptoms and stress of a serious illness. The goal is to improve quality of life for both the patient and the family.   [name] shares [name]  moved into [facility] 05/2022, has been adjusting fairly well. Will go through times where she becomes less interested in participating in activities and spends more time in bed. She was recently within the last several weeks not participating in therapy and preferred to utilize her wheelchair. At baseline though she is able to independently perform ADL's with minimal assistance required with ambulation-typically utilizes a walker.  [name] is currently married but her husband resides in LTC at a different facility. At baseline, [name] has cognitive impairment, [name] shares she has been seen by a neurologist in the past that feels there is a level of underlying dementia complicated by previous hypoperfusion history.  [name] has appointed [name] as [name].  [name] shares his understanding of current medical condition. Aware initially [name] being treated for A. Fib and placed on anticoagulation, risk of bleeding. Aware hematoma found on CT and underwent evacuation with drain placement.   We discussed goals of care, most specifically Code Status. We discussed the difference between Full Code versus Do Not Resuscitate. [name] shares he was able to speak with his father earlier today and he wishes for [name] to remain Full Code-all resuscitate interventions. [name] shares he is in a difficult position as he feels differently regarding his mothers wishes. Plans to have ongoing conversations with his father. We also discussed long term goals in the event [name] is unable to be successfully weaned from the ventilator. We discussed trach/PEG for which he feels his mom would not want for herself.  Time for outcomes needed. He remains hopeful for a meaningful recovery with eventual return to ALF as she has expressed in the past she would not want LTC placement.  I discussed importance of continued conversations with family/support  persons and all members of their medical team regarding overall plan of  care and treatment options ensuring decisions are in alignment with patients goals of care.  All questions/concerns addressed.  PMT will continue to follow and support patient as needed.  Addendum Called to bedside by nursing staff as [name] having more frequent arrhythmia episodes, son at bedside requesting continued goals of care conversation. [name] shares he has had the opportunity to process earlier conversations most specifically Code Status-again he wishes to honor what he feels [name] would want for herself. [name] shares in the event [name] has no pulse or stops breathing he DOES NOT want resuscitative efforts to be performed. He wishes to continue all medical interventions at this time but wishes for DNR.  Order reflected in EMR DNR with continued medical interventions including: advanced airways, mechanical ventilation or cardioversion in appropriate circumstances; medication/IV fluids as indicated  Objective: Primary Diagnoses: Present on Admission:  Dementia with behavioral disturbance (HCC)  Hypothyroidism  Acute cystitis without hematuria  Paroxysmal atrial fibrillation (HCC)  AKI (acute kidney injury) (HCC)  Hypokalemia  Hypotension  Falls  Fall  Gait instability  Atrial fibrillation with RVR (HCC)  Cerebrovascular accident (CVA) due to thrombosis of precerebral artery (HCC)  DEPENDENCE, OPIOID, CONTINUOUS  Obesity (BMI 30.0-34.9)   Vital Signs: BP (!) 91/55   Pulse 62   Temp 99.9 F (37.7 C) (Esophageal)   Resp 18   Ht 5\' 5"  (1.651 m)   Wt 100.3 kg   SpO2 100%   BMI 36.80 kg/m  Pain Scale: CPOT POSS *See Group Information*: S-Acceptable,Sleep, easy to arouse Pain Score: 0-No pain   IO: Intake/output summary:  Intake/Output Summary (Last 24 hours) at 02/17/2023 1259 Last data filed at 02/17/2023 0553 Gross per 24 hour  Intake 1976.25 ml  Output 1445 ml  Net 531.25 ml    LBM: Last BM Date : 02/15/23 Baseline Weight: Weight: 102 kg Most recent  weight: Weight: 100.3 kg       Assessment and Plan  SUMMARY OF RECOMMENDATIONS   Full Code-Full Scope Time for outcomes with ongoing GOC needed PMT to continue to follow for ongoing needs and support  Discussed With: Primary team and nursing staff   Thank you for this consult and allowing Palliative Medicine to participate in the care of Homero Fellers. Palliative medicine will continue to follow and assist as needed.   Time Total: 75 minutes  Time spent includes: Detailed review of medical records (labs, imaging, vital signs), medically appropriate exam (mental status, respiratory, cardiac, skin), discussed with treatment team, counseling and educating patient, family and staff, documenting clinical information, medication management and coordination of care.   Signed by: Leeanne Deed, DNP, AGNP-C Palliative Medicine    Please contact Palliative Medicine Team phone at [phone] for questions and concerns.  For individual provider: See Loretha Stapler

## 2023-02-18 DIAGNOSIS — Z7189 Other specified counseling: Secondary | ICD-10-CM | POA: Diagnosis not present

## 2023-02-18 DIAGNOSIS — I509 Heart failure, unspecified: Secondary | ICD-10-CM | POA: Diagnosis not present

## 2023-02-18 DIAGNOSIS — Z515 Encounter for palliative care: Secondary | ICD-10-CM | POA: Diagnosis not present

## 2023-02-18 DIAGNOSIS — I4891 Unspecified atrial fibrillation: Secondary | ICD-10-CM | POA: Diagnosis not present

## 2023-02-18 DIAGNOSIS — I6300 Cerebral infarction due to thrombosis of unspecified precerebral artery: Secondary | ICD-10-CM | POA: Diagnosis not present

## 2023-02-18 DIAGNOSIS — N3000 Acute cystitis without hematuria: Secondary | ICD-10-CM | POA: Diagnosis not present

## 2023-02-18 DIAGNOSIS — I6201 Nontraumatic acute subdural hemorrhage: Secondary | ICD-10-CM | POA: Diagnosis not present

## 2023-02-18 LAB — RENAL FUNCTION PANEL
Albumin: 2.4 g/dL — ABNORMAL LOW (ref 3.5–5.0)
Anion gap: 5 (ref 5–15)
BUN: 7 mg/dL — ABNORMAL LOW (ref 8–23)
CO2: 21 mmol/L — ABNORMAL LOW (ref 22–32)
Calcium: 7.3 mg/dL — ABNORMAL LOW (ref 8.9–10.3)
Chloride: 102 mmol/L (ref 98–111)
Creatinine, Ser: 0.66 mg/dL (ref 0.44–1.00)
GFR, Estimated: >60 mL/min (ref >60)
Glucose, Bld: 153 mg/dL — ABNORMAL HIGH (ref 70–99)
Phosphorus: 2.3 mg/dL — ABNORMAL LOW (ref 2.5–4.6)
Potassium: 5.1 mmol/L (ref 3.5–5.1)
Sodium: 128 mmol/L — ABNORMAL LOW (ref 135–145)

## 2023-02-18 LAB — CBC
HCT: 27.4 % — ABNORMAL LOW (ref 36.0–46.0)
Hemoglobin: 8.8 g/dL — ABNORMAL LOW (ref 12.0–15.0)
MCH: 26.5 pg (ref 26.0–34.0)
MCHC: 32.1 g/dL (ref 30.0–36.0)
MCV: 82.5 fL (ref 80.0–100.0)
Platelets: 184 10*3/uL (ref 150–400)
RBC: 3.32 MIL/uL — ABNORMAL LOW (ref 3.87–5.11)
RDW: 16.1 % — ABNORMAL HIGH (ref 11.5–15.5)
WBC: 21 10*3/uL — ABNORMAL HIGH (ref 4.0–10.5)
nRBC: 0 % (ref 0.0–0.2)

## 2023-02-18 LAB — MAGNESIUM: Magnesium: 2.3 mg/dL (ref 1.7–2.4)

## 2023-02-18 LAB — GLUCOSE, CAPILLARY
Glucose-Capillary: 116 mg/dL — ABNORMAL HIGH (ref 70–99)
Glucose-Capillary: 125 mg/dL — ABNORMAL HIGH (ref 70–99)
Glucose-Capillary: 128 mg/dL — ABNORMAL HIGH (ref 70–99)
Glucose-Capillary: 133 mg/dL — ABNORMAL HIGH (ref 70–99)
Glucose-Capillary: 138 mg/dL — ABNORMAL HIGH (ref 70–99)
Glucose-Capillary: 170 mg/dL — ABNORMAL HIGH (ref 70–99)

## 2023-02-18 LAB — CULTURE, BLOOD (ROUTINE X 2)
Special Requests: ADEQUATE
Special Requests: ADEQUATE

## 2023-02-18 MED ORDER — K PHOS MONO-SOD PHOS DI & MONO 155-852-130 MG PO TABS
500.0000 mg | ORAL_TABLET | Freq: Once | ORAL | Status: AC
  Administered 2023-02-18: 500 mg
  Filled 2023-02-18: qty 2

## 2023-02-18 MED ORDER — VITAMIN D 25 MCG (1000 UNIT) PO TABS
2000.0000 [IU] | ORAL_TABLET | Freq: Every day | ORAL | Status: DC
  Administered 2023-02-19: 2000 [IU]
  Filled 2023-02-18: qty 2

## 2023-02-18 MED ORDER — ENOXAPARIN SODIUM 60 MG/0.6ML IJ SOSY: 50.0000 mg | PREFILLED_SYRINGE | INTRAMUSCULAR | Status: DC

## 2023-02-18 MED ORDER — CALCIUM GLUCONATE-NACL 1-0.675 GM/50ML-% IV SOLN
1.0000 g | Freq: Once | INTRAVENOUS | Status: AC
  Administered 2023-02-18: 1000 mg via INTRAVENOUS
  Filled 2023-02-18: qty 50

## 2023-02-18 NOTE — Progress Notes (Signed)
CRITICAL CARE PROGRESS NOTE    Name: [name] MRN: [id number] DOB: 1945/11/26     LOS: 10   SUBJECTIVE FINDINGS & SIGNIFICANT EVENTS    History of Presenting Illness:  This is a 77 year old female with a history of atrial fibrillation with rapid ventricular response, hypothyroidism, epilepsy, anxiety and depression, history of recurrent falls on multiple centrally acting medications came in with findings of atrial fibrillation rapid ventricular response was initially seen by cardiology medically treated placed on blood thinners with Eliquis underwent transesophageal echo with cardioversion.  She is being treated for diastolic CHF as well has UTI.  She does have a history of chronic diarrhea which was also managed on the floors.  Initially had some improvement symptomatically was being diuresed on the floor then suddenly developed headaches and was treated with narcotics for this.  She had CT head performed which showed subdural hematoma and patient was brought in to medical intensive care unit lethargic unable to protect airway with active intracranial bleeding.  Multiple consultants were contacted including neurosurgery and plan is made to take patient to the operating room for bur hole procedure.   02/17/23- S/P neurosurgery JP drain in place with active drain.  CT head with interval improvement. VT this am s/p cardio eval. We have been in communication with cardiology throughout day with repeated treatments for arrythmia.  No SBT today per neurosurgery. She is on propofol and fentantyl, making adequate urine.   02/18/23- Patient is having SBT today.  She is slowly waking up.  Reviewed medical plan with NSGY, will start DVP ppx tommorow evening post removal of intracranial drain. Family at bedside discussed plan and  answered questions.   Lines/tubes : Airway 8 mm (Active)  Secured at (cm) 23 cm 02/16/23 1405  Measured From Lips 02/16/23 1405  Secured By [employer] 02/16/23 1405  Cuff Pressure (cm H2O) Green OR 18-26 [facility] 02/16/23 1405     External Urinary Catheter (Active)  Collection Container Dedicated Suction Canister 02/16/23 0800  Suction (Verified suction is between 40-80 mmHg) Yes 02/16/23 0800  Securement Method None needed 02/16/23 0800  Site Assessment Clean, Dry, Intact 02/16/23 0800  Intervention Other (Comment) 02/16/23 0800  Output (mL) 650 mL 02/16/23 0032    Microbiology/Sepsis markers: Results for orders placed or performed during the hospital encounter of 02/07/23  Blood Culture (routine x 2)     Status: None   Collection Time: 02/07/23  7:27 PM   Specimen: BLOOD  Result Value Ref Range Status   Specimen Description BLOOD BLOOD LEFT FOREARM  Final   Special Requests   Final    BOTTLES DRAWN AEROBIC AND ANAEROBIC Blood Culture adequate volume   Culture   Final    NO GROWTH 5 DAYS Performed at [facility], [address] [place], [place] [address]    Report Status 02/12/2023 FINAL  Final  Blood Culture (routine x 2)     Status: None   Collection Time: 02/07/23  7:32 PM   Specimen: BLOOD  Result Value Ref Range Status   Specimen Description BLOOD BLOOD RIGHT ARM  Final   Special Requests   Final    BOTTLES DRAWN AEROBIC AND ANAEROBIC Blood Culture results may not be optimal due to an excessive volume of blood received in culture bottles   Culture   Final    NO GROWTH 5 DAYS Performed at [facility], [address], [place], [place] [address]    Report Status 02/12/2023 FINAL  Final  Urine Culture     Status: Abnormal   Collection Time: 02/07/23  8:47 PM   Specimen: Urine, Random  Result Value Ref Range Status   Specimen Description   Final    URINE, RANDOM Performed at [facility], [address], [place],  [place] [address]    Special Requests   Final    NONE Reflexed from [phone] Performed at [facility], [address] [place], [place] [address]    Culture (A)  Final    >=100,000 COLONIES/mL AEROCOCCUS SPECIES Standardized susceptibility testing for this organism is not available. Performed at [facility], [address], [place], [place] [address]    Report Status 02/09/2023 FINAL  Final  MRSA Next Gen by PCR, Nasal     Status: Abnormal   Collection Time: 02/16/23  2:01 PM   Specimen: Nasal Mucosa; Nasal Swab  Result Value Ref Range Status   MRSA by PCR Next Gen DETECTED (A) NOT DETECTED Final    Comment: RESULT CALLED TO, READ BACK BY AND VERIFIED WITH: KELLY ICNHAER AT 1751 ON 02/16/23 BY SS (NOTE) The GeneXpert MRSA Assay (FDA approved for NASAL specimens only), is one component of a comprehensive MRSA colonization surveillance program. It is not intended to diagnose MRSA infection nor to guide or monitor treatment for MRSA infections. Test performance is not FDA approved in patients less than 45 years old. Performed at [facility], [address] [place], [place] [address]   Culture, blood (Routine X 2) w Reflex to ID Panel     Status: None (Preliminary result)   Collection Time: 02/16/23  6:39 PM   Specimen: BLOOD  Result Value Ref Range Status   Specimen Description BLOOD BLOOD RIGHT ARM  Final   Special Requests   Final    BOTTLES DRAWN AEROBIC AND ANAEROBIC Blood Culture adequate volume   Culture   Final    NO GROWTH 2 DAYS Performed at [facility], [address], [place], [place] [address]    Report Status PENDING  Incomplete  Culture, blood (Routine X 2) w Reflex to ID Panel     Status: None (Preliminary result)   Collection Time: 02/16/23  6:56 PM   Specimen: BLOOD  Result Value Ref Range Status   Specimen Description BLOOD BLOOD LEFT ARM  Final   Special Requests   Final    BOTTLES DRAWN AEROBIC AND ANAEROBIC Blood Culture  adequate volume   Culture   Final    NO GROWTH 2 DAYS Performed at [facility], [address], [place], [place] [address]    Report Status PENDING  Incomplete    Anti-infectives:  Anti-infectives (From admission, onward)    Start     Dose/Rate Route Frequency Ordered Stop   02/17/23 0000  ceFAZolin (ANCEF) IVPB 2g/100 mL premix        2 g 200 mL/hr over 30 Minutes Intravenous Every 8 hours 02/16/23 1917     02/12/23 2200  cephALEXin (KEFLEX) capsule 500 mg        500 mg Oral Every 12 hours 02/12/23 1437 02/12/23 2226   02/09/23 1330  cephALEXin (KEFLEX) capsule 500 mg  Status:  Discontinued        500 mg Oral Every 12 hours 02/09/23 1235 02/12/23 1437   02/07/23 2215  cefTRIAXone (ROCEPHIN) 2 g in sodium chloride 0.9 % 100 mL IVPB  Status:  Discontinued        2 g 200  mL/hr over 30 Minutes Intravenous Every 24 hours 02/07/23 2209 02/09/23 1235   02/07/23 2030  ceFEPIme (MAXIPIME) 2 g in sodium chloride 0.9 % 100 mL IVPB        2 g 200 mL/hr over 30 Minutes Intravenous  Once 02/07/23 2017 02/07/23 2130   02/07/23 2030  metroNIDAZOLE (FLAGYL) IVPB 500 mg  Status:  Discontinued        500 mg 100 mL/hr over 60 Minutes Intravenous  Once 02/07/23 2017 02/07/23 2018   02/07/23 2030  vancomycin (VANCOCIN) IVPB 1000 mg/200 mL premix  Status:  Discontinued        1,000 mg 200 mL/hr over 60 Minutes Intravenous  Once 02/07/23 2017 02/07/23 2018        PAST MEDICAL HISTORY   Past Medical History:  Diagnosis Date   Anemia    took Fe- orally 2 yrs. ago    Anxiety    panic attacks    Arthritis    shoulders, knees   Cancer (HCC)    skin- preCA   Depression    GERD (gastroesophageal reflux disease)    uses Protonix as needed    Headache(784.0)    Humerus fracture    Right    Hyposmolality and/or hyponatremia 02/27/2013   Hypothyroidism    IBS (irritable bowel syndrome)    Injury of left foot 09/05/2018   Injury of right hand 04/27/2014   Memory loss    Mental  disorder    Migraine    Preoperative examination 02/26/2013   Seizures (HCC)    caused by depression , seritonin seizure - effected short term memory     Shortness of breath      SURGICAL HISTORY   Past Surgical History:  Procedure Laterality Date   ABDOMINAL HYSTERECTOMY     APPENDECTOMY     BACK SURGERY  1990's   BREAST SURGERY  1972   augmentation    CARDIOVERSION N/A 02/13/2023   Procedure: CARDIOVERSION;  Surgeon: Antonieta Iba, MD;  Location: [facility] ORS;  Service: Cardiovascular;  Laterality: N/A;   CHOLECYSTECTOMY     GASTRECTOMY  1994   Due to ulcer, required multiple revisions.    HERNIA REPAIR     ORIF ANKLE FRACTURE Right 2004   REPLACEMENT TOTAL KNEE     REVERSE SHOULDER ARTHROPLASTY Right 05/16/2013   Procedure: RIGHT HEMI ARTHROPLASTY  VERSES  REVERSE TOTAL SHOULDER ARTHROPLASTY ;  Surgeon: Verlee Rossetti, MD;  Location: [facility] OR;  Service: Orthopedics;  Laterality: Right;   SPINE SURGERY  2005   Lumbar spinal stenosis    STOMACH SURGERY     1/3 resection for obstruction   TEE WITHOUT CARDIOVERSION N/A 02/13/2023   Procedure: TRANSESOPHAGEAL ECHOCARDIOGRAM (TEE);  Surgeon: Antonieta Iba, MD;  Location: [facility] ORS;  Service: Cardiovascular;  Laterality: N/A;   TONSILLECTOMY     TUBAL LIGATION       FAMILY HISTORY   Family History  Problem Relation Age of Onset   Aneurysm Mother    Heart disease Mother    Cancer Father        pancreatic   Stroke Father 30   Hypertension Father    Alcohol abuse Father    Heart disease Maternal Grandfather      SOCIAL HISTORY   Social History   Tobacco Use   Smoking status: Never   Smokeless tobacco: Never  Vaping Use   Vaping Use: Never used  Substance Use Topics   Alcohol use: Yes  Comment: rare use of white wine    Drug use: No     MEDICATIONS   Current Medication:  Current Facility-Administered Medications:    0.9 %  sodium chloride infusion, 250 mL, Intravenous, Continuous, Harlon Ditty D,  NP, Last Rate: 10 mL/hr at 02/18/23 0800, Infusion Verify at 02/18/23 0800   acetaminophen (TYLENOL) tablet 650 mg, 650 mg, Per Tube, Q6H PRN **OR** acetaminophen (TYLENOL) suppository 650 mg, 650 mg, Rectal, Q6H PRN, Tressie Ellis, RPH   ceFAZolin (ANCEF) IVPB 2g/100 mL premix, 2 g, Intravenous, Q8H, Jaynie Bream, RPH, Stopped at 02/18/23 0547   Chlorhexidine Gluconate Cloth 2 % PADS 6 each, 6 each, Topical, Q0600, Marcelino Duster, MD, 6 each at 02/18/23 0516   [START ON 02/19/2023] cholecalciferol (VITAMIN D3) 25 MCG (1000 UNIT) tablet 2,000 Units, 2,000 Units, Per Tube, Daily, Tressie Ellis, RPH   docusate (COLACE) 50 MG/5ML liquid 100 mg, 100 mg, Per Tube, BID, Harlon Ditty D, NP, 100 mg at 02/18/23 0810   fentaNYL (SUBLIMAZE) bolus via infusion 25-100 mcg, 25-100 mcg, Intravenous, Q15 min PRN, Harlon Ditty D, NP   fentaNYL in NS (48mcg/ml) infusion-PREMIX, 0-400 mcg/hr, Intravenous, Continuous, Kameran Mcneese, MD, Last Rate: 5 mL/hr at 02/18/23 0800, 50 mcg/hr at 02/18/23 0800   furosemide (LASIX) tablet 20 mg, 20 mg, Per Tube, Daily, Tressie Ellis, RPH, 20 mg at 02/18/23 0810   hydrocortisone cream 1 % 1 Application, 1 Application, Topical, TID PRN, Clide Dales, Narendranath, MD   insulin aspart (novoLOG) injection 0-15 Units, 0-15 Units, Subcutaneous, Q4H, Harlon Ditty D, NP, 2 Units at 02/18/23 0340   iohexol (OMNIPAQUE) 350 MG/ML injection 75 mL, 75 mL, Intravenous, Once PRN, Caryl Pina, MD   isoproterenol (ISUPREL) 1 mg in dextrose 5 % 250 mL (0.004 mg/mL) infusion, 1 mcg/min, Intravenous, Titrated, Marinus Maw, MD, Last Rate: 15 mL/hr at 02/18/23 0800, 1 mcg/min at 02/18/23 0800   levothyroxine (SYNTHROID) tablet 100 mcg, 100 mcg, Per Tube, Daily, Tressie Ellis, RPH   midazolam (VERSED) injection 1-2 mg, 1-2 mg, Intravenous, Q1H PRN, Harlon Ditty D, NP   norepinephrine (LEVOPHED) 4mg  in (0.016 mg/mL) premix infusion, 2-10 mcg/min,  Intravenous, Titrated, Harlon Ditty D, NP, Last Rate: 37.5 mL/hr at 02/18/23 0809, 10 mcg/min at 02/18/23 0809   Oral care mouth rinse, 15 mL, Mouth Rinse, PRN, Marcelino Duster, MD   Oral care mouth rinse, 15 mL, Mouth Rinse, Q2H, Teya Otterson, MD, 15 mL at 02/18/23 0800   Oral care mouth rinse, 15 mL, Mouth Rinse, PRN, Vida Rigger, MD   oxyCODONE (Oxy IR/ROXICODONE) immediate release tablet 10 mg, 10 mg, Per Tube, Q8H, Chappell, Alex B, RPH   oxyCODONE (Oxy IR/ROXICODONE) immediate release tablet 5 mg, 5 mg, Per Tube, Q4H PRN, Ardell Isaacs, Alex B, RPH   pantoprazole (PROTONIX) injection 40 mg, 40 mg, Intravenous, Daily, Harlon Ditty D, NP, 40 mg at 02/18/23 0810   polyethylene glycol (MIRALAX / GLYCOLAX) packet 17 g, 17 g, Per Tube, Daily, Harlon Ditty D, NP, 17 g at 02/18/23 0810   pregabalin (LYRICA) capsule 100 mg, 100 mg, Per Tube, BID, Tressie Ellis, RPH, 100 mg at 02/18/23 0850   propofol (DIPRIVAN) 1000 MG/100ML infusion, 5-80 mcg/kg/min, Intravenous, Titrated, Harlon Ditty D, NP, Last Rate: 26.5 mL/hr at 02/18/23 0800, 45 mcg/kg/min at 02/18/23 0800   ramelteon (ROZEREM) tablet 8 mg, 8 mg, Oral, QHS, [place], Elsie Ra, MD, 8 mg at 02/15/23 2147   valproic acid (DEPAKENE) 250 MG/5ML solution 125  mg, 125 mg, Per Tube, BID, Tressie Ellis, RPH, 125 mg at 02/18/23 1610   vortioxetine HBr (TRINTELLIX) tablet 20 mg, 20 mg, Per Tube, Daily, Tressie Ellis, RPH, 20 mg at 02/18/23 0813    ALLERGIES   Bupropion hcl, Other, Penicillins, Tilactase, Azithromycin, and Erythromycin    REVIEW OF SYSTEMS     Unable to perform ROS due to encephalopathy with lethargy  PHYSICAL EXAMINATION   Vital Signs: Temp:  [98.4 F (36.9 C)-100.8 F (38.2 C)] 98.8 F (37.1 C) (06/16 0830) Pulse Rate:  [60-71] 65 (06/16 0830) Resp:  [17-19] 18 (06/16 0830) BP: (88-113)/(45-87) 109/56 (06/16 0830) SpO2:  [95 %-100 %] 100 % (06/16 0830) FiO2 (%):  [28 %-30 %] 28 % (06/16  0728) Weight:  [102.5 kg] 102.5 kg (06/16 0500)  GENERAL: Age-appropriate mild distress due to acute intracranial bleeding HEAD: Normocephalic, atraumatic.  EYES: Pupils equal, round, reactive to light.  No scleral icterus.  MOUTH: Moist mucosal membrane. NECK: Supple. No thyromegaly. No nodules. No JVD.  PULMONARY: Mild rhonchorous lung sounds bilaterally CARDIOVASCULAR: S1 and S2. Regular rate and rhythm. No murmurs, rubs, or gallops.  GASTROINTESTINAL: Soft, nontender, non-distended. No masses. Positive bowel sounds. No hepatosplenomegaly.  MUSCULOSKELETAL: No swelling, clubbing, or edema.  NEUROLOGIC:GCS4T SKIN:intact,warm,dry   PERTINENT DATA     Infusions:  sodium chloride 10 mL/hr at 02/18/23 0800    ceFAZolin (ANCEF) IV Stopped (02/18/23 0547)   fentaNYL infusion INTRAVENOUS 50 mcg/hr (02/18/23 0800)   isoproterenol (ISUPREL) 1 mg in dextrose 5 % 250 mL (0.004 mg/mL) infusion 1 mcg/min (02/18/23 0800)   norepinephrine (LEVOPHED) Adult infusion 10 mcg/min (02/18/23 0809)   propofol (DIPRIVAN) infusion 45 mcg/kg/min (02/18/23 0800)   Scheduled Medications:  Chlorhexidine Gluconate Cloth  6 each Topical Q0600   [START ON 02/19/2023] cholecalciferol  2,000 Units Per Tube Daily   docusate  100 mg Per Tube BID   furosemide  20 mg Per Tube Daily   insulin aspart  0-15 Units Subcutaneous Q4H   levothyroxine  100 mcg Per Tube Daily   mouth rinse  15 mL Mouth Rinse Q2H   oxyCODONE  10 mg Per Tube Q8H   pantoprazole (PROTONIX) IV  40 mg Intravenous Daily   polyethylene glycol  17 g Per Tube Daily   pregabalin  100 mg Per Tube BID   ramelteon  8 mg Oral QHS   valproic acid  125 mg Per Tube BID   vortioxetine HBr  20 mg Per Tube Daily   PRN Medications: acetaminophen **OR** acetaminophen, fentaNYL, hydrocortisone cream, iohexol, midazolam, mouth rinse, mouth rinse, oxyCODONE Hemodynamic parameters:   Intake/Output: 06/15 0701 - 06/16 0700 In: 4462.4 [I.V.:3483.6; IV  Piggyback:978.9] Out: 1600 [Urine:1410; Emesis/NG output:165; Drains:25]  Ventilator  Settings: Vent Mode: PRVC FiO2 (%):  [28 %-30 %] 28 % Set Rate:  [18 bmp] 18 bmp Vt Set:  [480 mL] 480 mL PEEP:  [5 cmH20] 5 cmH20 Plateau Pressure:  [17 cmH20-19 cmH20] 19 cmH20   LAB RESULTS:  Basic Metabolic Panel: Recent Labs  Lab 02/16/23 1239 02/16/23 1241 02/16/23 2156 02/17/23 0522 02/17/23 1452 02/18/23 0520  NA 130*  --  130* 131* 128* 128*  K 3.6  --  3.8 3.3* 4.7 5.1  CL 94*  --  97* 98 96* 102  CO2 23  --  24 25 23  21*  GLUCOSE 203*  --  161* 150* 150* 153*  BUN 11  --  10 10 8  7*  CREATININE 0.81  --  0.81 0.72 0.73 0.66  CALCIUM 8.5*  --  7.9* 8.1* 7.7* 7.3*  MG  --  1.8 2.0  --  1.7 2.3  PHOS  --  3.9  --  3.9  --  2.3*    Liver Function Tests: Recent Labs  Lab 02/16/23 1239 02/17/23 0522 02/18/23 0520  AST 21  --   --   ALT 11  --   --   ALKPHOS 87  --   --   BILITOT 0.6  --   --   PROT 7.1  --   --   ALBUMIN 3.3* 2.8* 2.4*    No results for input(s): "LIPASE", "AMYLASE" in the last 168 hours. Recent Labs  Lab 02/16/23 1242  AMMONIA 31    CBC: Recent Labs  Lab 02/16/23 0659 02/16/23 1241 02/16/23 2156 02/17/23 0522 02/18/23 0520  WBC 5.9 15.8* 12.6* 12.4* 21.0*  HGB 10.6* 11.6* 10.1* 9.8* 8.8*  HCT 33.0* 36.1 31.0* 30.4* 27.4*  MCV 81.5 80.8 81.6 81.7 82.5  PLT 170 227 235 190 184    Cardiac Enzymes: No results for input(s): "CKTOTAL", "CKMB", "CKMBINDEX", "TROPONINI" in the last 168 hours. BNP: Invalid input(s): "POCBNP" CBG: Recent Labs  Lab 02/17/23 1923 02/17/23 1953 02/17/23 2318 02/18/23 0332 02/18/23 0756  GLUCAP 110* 133* 128* 138* 125*        ASSESSMENT AND PLAN    -Multidisciplinary rounds held today  Acute subdural hematoma -Patient on Eliquis, may need reversal agent -Status post endotracheal intubation and mechanical ventilation -Neurosurgery on case-appreciate input to take patient to the OR for  neurosurgical intervention -Blood pressure goals per neurology and neurosurgery-can consider hypertonic saline if necessary -Left internal jugular central venous access successfully placed -NSGY on case - please follow recommendations    Chronic diastolic heart failure with atrial fibrillation and rapid ventricular response --recurrent arrythmia today, s/p repletion of electrolytes and cardiology evaluation - appreciate input   Epilepsy -Patient takes depakote at home -currently on general anesthesia, will add as needed   Hypothyroidism  Continue levothyroxine    GI/Nutrition GI PROPHYLAXIS as indicated DIET-->TF's as tolerated Constipation protocol as indicated  ENDO - ICU hypoglycemic\Hyperglycemia protocol -check FSBS per protocol   ELECTROLYTES -follow labs as needed -replace as needed -pharmacy consultation   DVT/GI PRX ordered -SCDs  TRANSFUSIONS AS NEEDED MONITOR FSBS ASSESS the need for LABS as needed    Critical care provider statement:   Total critical care time: 33 minutes   Performed by: Karna Christmas MD   Critical care time was exclusive of separately billable procedures and treating other patients.   Critical care was necessary to treat or prevent imminent or life-threatening deterioration.   Critical care was time spent personally by me on the following activities: development of treatment plan with patient and/or surrogate as well as nursing, discussions with consultants, evaluation of patient's response to treatment, examination of patient, obtaining history from patient or surrogate, ordering and performing treatments and interventions, ordering and review of laboratory studies, ordering and review of radiographic studies, pulse oximetry and re-evaluation of patient's condition.    Vida Rigger, M.D.  Pulmonary & Critical Care Medicine

## 2023-02-18 NOTE — Anesthesia Postprocedure Evaluation (Signed)
Anesthesia Post Note  Patient: [name]  Procedure(s) Performed: CRANIOTOMY HEMATOMA EVACUATION SUBDURAL (Left) CRANIOTOMY BONE FLAP/PROSTHETIC PLATE  Patient location during evaluation: SICU Anesthesia Type: General Level of consciousness: sedated Pain management: pain level controlled Vital Signs Assessment: post-procedure vital signs reviewed and stable Respiratory status: patient remains intubated per anesthesia plan Cardiovascular status: stable Postop Assessment: no apparent nausea or vomiting Anesthetic complications: no   No notable events documented.   Last Vitals:  Vitals:   02/18/23 0600 02/18/23 0700  BP: 105/64 (!) 109/57  Pulse: 64 65  Resp: 18 18  Temp: 37.1 C 36.9 C  SpO2: 99% 100%    Last Pain:  Vitals:   02/18/23 0700  TempSrc: Esophageal  PainSc:                  Louie Boston

## 2023-02-18 NOTE — Progress Notes (Addendum)
Daily Progress Note   Patient Name: [name]       Date: 02/18/2023 DOB: 1946-05-08  Age: 77 y.o. MRN#: [id number] Attending Physician: Mary Rigger, MD Primary Care Physician: Mary Houseman, NP Admit Date: 02/07/2023  Reason for Consultation/Follow-up: Establishing goals of care  HPI/Brief Hospital Review: 77 y.o. female  with past medical history of seizure disorder, hypothyroidism, anxiety, depression, neuropathy, frequent falls and cognitive impairment admitted from [facility] on 02/07/2023 after a fall and found to be in A. Fib with RVR with hypotension.   Underwent TEE cardioversion 6/11-converted to NSR   Developed headache 6/14-sent for head CT IMPRESSION: New large left and small right subdural hematomas with mass effect as above, including 1.6 cm of rightward midline shift and left uncal herniation.   Underwent craniotomy for evacuation of hematoma-subgaleal drain remains in place to bulb suction Returned to ICU post-op, remains intubated and sedated   6/15 Remains intubated and sedated Opens eyes to calling of her name Frequent runs of v.fib/tach-cardiology following, replacing electrolytes  6/16 Arrhythmias better controlled overnight Attempting WUA/SBT today    Palliative medicine was consulted for assisting with goals of care conversations.  Subjective: Extensive chart review has been completed prior to meeting patient including labs, vital signs, imaging, progress notes, orders, and available advanced directive documents from current and previous encounters.    Visited [name] at her bedside. Remains intubated and sedated. Son-[name] at bedside, receiving adequate updates from medical team, no questions or concerns. Plan to monitor throughout day as  sedation is weaned while assessing mentation. He remains hopeful for meaningful recovery.  PMT to continue to follow for ongoing needs and support.  Addendum: Notified by nursing staff mention of possible extubation tomorrow, clarification needed on this being one-way extubation. Per discussions had with son yesterday, one-way extubation not established. Called and spoke with [name]-son, discussed plan of care, possible extubation for tomorrow, again discussed Code Status and his wish for [name] to remain DNR. We discussed risk of her being unable to protect her own airway post extubation and his desire for re-intubation if needed or transitioning to comfort care at that time. [name] not at a place to make a decision, needs time for processing and time to discuss with his wife and father. Nursing staff and [name] made  aware of current plan.   Palliative Care Assessment & Plan   Assessment/Recommendation/Plan  DNR Time for outcomes PMT to continue to follow for ongoing needs and support  Care plan was discussed with nursing staff and primary team.  Thank you for allowing the Palliative Medicine Team to assist in the care of this patient.  Total time:  35 minutes  Time spent includes: Detailed review of medical records (labs, imaging, vital signs), medically appropriate exam (mental status, respiratory, cardiac, skin), discussed with treatment team, counseling and educating patient, family and staff, documenting clinical information, medication management and coordination of care.  Mary Deed, DNP, AGNP-C Palliative Medicine   Please contact Palliative Medicine Team phone at [phone] for questions and concerns.

## 2023-02-18 NOTE — Consult Note (Signed)
PHARMACY CONSULT NOTE  Pharmacy Consult for Electrolyte Monitoring and Replacement   Recent Labs: Potassium (mmol/L)  Date Value  02/18/2023 5.1   Magnesium (mg/dL)  Date Value  16/06/9603 2.3   Calcium (mg/dL)  Date Value  54/05/8118 7.3 (L)   Albumin (g/dL)  Date Value  14/78/2956 2.4 (L)  03/31/2022 4.0   Phosphorus (mg/dL)  Date Value  21/30/8657 2.3 (L)   Sodium (mmol/L)  Date Value  02/18/2023 128 (L)  03/31/2022 135   Assessment: 77 y/o F with medical history including seizures, hypothyroidism, depression, IBS admitted with fall complicated by subdural hematoma. Neurosurgery performed frontotemporoparietal craniotomy for evacuation of hematoma. Pharmacy consulted to assist with electrolyte monitoring and replacement as indicated.  Diet: NPO  MIVF: NS + 40 mEq K/L at 125 cc/hr  Goal of Therapy:  Electrolytes within normal limits  Plan:  --Na 128, management per primary team --K 5.1, discontinue MIVF with potassium as above --Phos 2.3, K Phos Neutral 500 mg per tube x 1 dose --Will follow-up electrolytes with AM labs tomorrow  Tressie Ellis 02/18/2023 7:14 AM

## 2023-02-18 NOTE — Progress Notes (Signed)
Attending Progress Note  History: [name] is here for a presentation of A-fib with RVR, she was being treated with anticoagulation and unfortunately developed a large left-sided subdural hematoma causing an acute change in her level of consciousness.  She was taken to the operating room yesterday for a left-sided craniotomy for hematoma evacuation.  She was admitted to the postoperative ICU afterwards.  Postoperative day 1: Had a head CT which showed significant decompression of her hematoma.  Improved midline shift.  She is requiring higher levels of sedation.   Postoperative day 2: Had multiple episodes of arrhythmia yesterday, no repeat head CT because of her instability.  She was requiring higher levels of sedation to stay comfortable on the ventilator.  Continues to respond.  Physical Exam: Vitals:   02/18/23 0600 02/18/23 0700  BP: 105/64 (!) 109/57  Pulse: 64 65  Resp: 18 18  Temp: 98.8 F (37.1 C) 98.4 F (36.9 C)  SpO2: 99% 100%    Currently intubated and sedated.  When weaned, she does awaken to voice, moves spontaneously and withdraws from pain.  Her pupils are round and reactive.  GCS is 8T  JP drain put out 70 yesterday, 25 today so far.  Data:  Recent Labs  Lab 02/17/23 0522 02/17/23 1452 02/18/23 0520  NA 131* 128* 128*  K 3.3* 4.7 5.1  CL 98 96* 102  CO2 25 23 21*  BUN 10 8 7*  CREATININE 0.72 0.73 0.66  GLUCOSE 150* 150* 153*  CALCIUM 8.1* 7.7* 7.3*   Recent Labs  Lab 02/16/23 1239  AST 21  ALT 11  ALKPHOS 87     Recent Labs  Lab 02/16/23 2156 02/17/23 0522 02/18/23 0520  WBC 12.6* 12.4* 21.0*  HGB 10.1* 9.8* 8.8*  HCT 31.0* 30.4* 27.4*  PLT 235 190 184   Recent Labs  Lab 02/13/23 0622 02/17/23 0522  INR 1.2 1.2         Other tests/results:  Narrative & Impression  CLINICAL DATA:  Subdural hematoma, follow-up   EXAM: CT HEAD WITHOUT CONTRAST   TECHNIQUE: Contiguous axial images were obtained from the base of the  skull through the vertex without intravenous contrast.   RADIATION DOSE REDUCTION: This exam was performed according to the departmental dose-optimization program which includes automated exposure control, adjustment of the mA and/or kV according to patient size and/or use of iterative reconstruction technique.   COMPARISON:  02/16/2023 CT head   FINDINGS: Brain: Status post interval left frontoparietal craniotomy for evacuation of the previously noted subdural hematoma. A drain is noted terminating in the left subdural space. Pneumocephalus along the left frontal convexity, with a small subdural collection measuring up to 6 mm (series 2, image 17). Hemorrhage is again noted along the anterior falx, measuring to 10 mm, previously 14 mm; along the posterior falx, measuring up to 8 mm, previously 8 mm; an along the left tentorium, measuring up to 7 mm, previously 8 mm.   8 mm left to right midline shift, previously 17 mm when remeasured similarly. Decreased mass effect on the lateral and third ventricles. The basilar cisterns are patent.   No evidence of acute infarct, parenchymal hemorrhage, or mass. No hydrocephalus. Unchanged mixed density subdural collection over the right frontal convexity, which measures up to 6 mm, unchanged.   Vascular: No hyperdense vessel.   Skull: Left frontotemporal craniotomy. Superficial skin staples. Air in the soft tissues is not unexpected postoperatively.   Sinuses/Orbits: Clear paranasal sinuses. Status post bilateral lens  replacements.   Other: The mastoids are well aerated.   IMPRESSION: 1. Status post left frontoparietal craniotomy for evacuation of the previously noted left subdural hematoma, with decreased midline shift, now 8 mm, and decreased mass effect on the lateral and third ventricles. 2. Pneumocephalus and a small subdural collection along the left frontal convexity, with stable to slightly decreased hematomas along the falx  and tentorium. 3. Unchanged mixed density subdural collection over the right frontal convexity, which measures up to 6 mm.     Electronically Signed   By: Wiliam Ke M.D.   On: 02/16/2023 21:45     Assessment/Plan:  [name] is a 77 year old woman with a history of A-fib with RVR who was admitted for anticoagulation/antiplatelet therapy.  She had a spontaneous loss of consciousness and upon workup was found to have a massive left-sided subdural hematoma.  Her preoperative neuroexam was GCS 3 T but she did have pupillary responses.  Family elected to move forward with aggressive management and therefore we took her to the OR for craniotomy for evacuation of subdural hematoma.  She was admitted to the ICU postoperatively.  On postoperative day 1 and day 2 she had an improving neurological exam.  She would awaken to voice when weaned off of her sedation.  She required higher levels of sedation to tolerate her ventilator.  -Her repeat head CT shows good decompression with improvement in her midline shift.  She does have some postoperative pneumocephalus which is expected.  Midline blood was also not approached given the morbidity associated with its removal.  When she is stable from a cardiac standpoint would like to obtain another head CT.  -Please continue to follow her drain output and continue prophylactic antibiotics.  Current output is downtrending.  -Head position as tolerated  -Continue to follow pupillary exam with vitals, and extremity exam every 4-8 hours.  -From a neurosurgical standpoint, she is appropriate to wean sedation  Lovenia Kim, MD/MSCR Department of Neurosurgery

## 2023-02-18 NOTE — Plan of Care (Signed)
Continuing with plan of care. 

## 2023-02-18 NOTE — Progress Notes (Signed)
Progress Note  Patient Name: [name] Date of Encounter: 02/18/2023  Primary Cardiologist: None   Subjective   Remains intubated and sedated  Inpatient Medications    Scheduled Meds:  Chlorhexidine Gluconate Cloth  6 each Topical Q0600   [START ON 02/19/2023] cholecalciferol  2,000 Units Per Tube Daily   docusate  100 mg Per Tube BID   furosemide  20 mg Per Tube Daily   insulin aspart  0-15 Units Subcutaneous Q4H   levothyroxine  100 mcg Per Tube Daily   mouth rinse  15 mL Mouth Rinse Q2H   oxyCODONE  10 mg Per Tube Q8H   pantoprazole (PROTONIX) IV  40 mg Intravenous Daily   polyethylene glycol  17 g Per Tube Daily   pregabalin  100 mg Per Tube BID   ramelteon  8 mg Oral QHS   valproic acid  125 mg Per Tube BID   vortioxetine HBr  20 mg Per Tube Daily   Continuous Infusions:  sodium chloride 10 mL/hr at 02/18/23 1100   calcium gluconate 1,000 mg (02/18/23 1143)    ceFAZolin (ANCEF) IV Stopped (02/18/23 0547)   fentaNYL infusion INTRAVENOUS 25 mcg/hr (02/18/23 1100)   isoproterenol (ISUPREL) 1 mg in dextrose 5 % 250 mL (0.004 mg/mL) infusion 1 mcg/min (02/18/23 1100)   norepinephrine (LEVOPHED) Adult infusion 10 mcg/min (02/18/23 1100)   propofol (DIPRIVAN) infusion Stopped (02/18/23 0918)   PRN Meds: acetaminophen **OR** acetaminophen, fentaNYL, hydrocortisone cream, iohexol, midazolam, mouth rinse, mouth rinse, oxyCODONE   Vital Signs    Vitals:   02/18/23 1000 02/18/23 1030 02/18/23 1100 02/18/23 1130  BP: 112/64 117/65 (!) 117/52 115/60  Pulse: 61 66 61 68  Resp: 18 19 20 18   Temp: 99.9 F (37.7 C) 99.9 F (37.7 C) 100 F (37.8 C) 100 F (37.8 C)  TempSrc: Esophageal Esophageal Esophageal Esophageal  SpO2: 100% 100% 100% 100%  Weight:      Height:        Intake/Output Summary (Last 24 hours) at 02/18/2023 1231 Last data filed at 02/18/2023 1100 Gross per 24 hour  Intake 4585.1 ml  Output 1300 ml  Net 3285.1 ml   Filed Weights   02/16/23 1350  02/17/23 0518 02/18/23 0500  Weight: 98.5 kg 100.3 kg 102.5 kg    Telemetry    NSR - Personally Reviewed  ECG    nsr - Personally Reviewed  Physical Exam   GEN: ETT in place.  Neck: 7 cm JVD Cardiac: RRR, no murmurs, rubs, or gallops.  Respiratory: Clear to auscultation bilaterally. GI: Soft, nontender, non-distended  MS: No edema; No deformity. Neuro:  Nonfocal  Psych: Normal affect   Labs    Chemistry Recent Labs  Lab 02/16/23 1239 02/16/23 2156 02/17/23 0522 02/17/23 1452 02/18/23 0520  NA 130*   < > 131* 128* 128*  K 3.6   < > 3.3* 4.7 5.1  CL 94*   < > 98 96* 102  CO2 23   < > 25 23 21*  GLUCOSE 203*   < > 150* 150* 153*  BUN 11   < > 10 8 7*  CREATININE 0.81   < > 0.72 0.73 0.66  CALCIUM 8.5*   < > 8.1* 7.7* 7.3*  PROT 7.1  --   --   --   --   ALBUMIN 3.3*  --  2.8*  --  2.4*  AST 21  --   --   --   --   ALT  11  --   --   --   --   ALKPHOS 87  --   --   --   --   BILITOT 0.6  --   --   --   --   GFRNONAA >60   < > >60 >60 >60  ANIONGAP 13   < > 8 9 5    < > = values in this interval not displayed.     Hematology Recent Labs  Lab 02/16/23 2156 02/17/23 0522 02/18/23 0520  WBC 12.6* 12.4* 21.0*  RBC 3.80* 3.72* 3.32*  HGB 10.1* 9.8* 8.8*  HCT 31.0* 30.4* 27.4*  MCV 81.6 81.7 82.5  MCH 26.6 26.3 26.5  MCHC 32.6 32.2 32.1  RDW 15.9* 15.9* 16.1*  PLT 235 190 184    Cardiac EnzymesNo results for input(s): "TROPONINI" in the last 168 hours. No results for input(s): "TROPIPOC" in the last 168 hours.   BNPNo results for input(s): "BNP", "PROBNP" in the last 168 hours.   DDimer No results for input(s): "DDIMER" in the last 168 hours.   Radiology    DG Chest Port 1 View  Result Date: 02/17/2023 CLINICAL DATA:  [id number] Acute respiratory failure with hypoxia (HCC) [id number] EXAM: PORTABLE CHEST - 1 VIEW COMPARISON:  the previous day's study FINDINGS: This endotracheal tube, gastric tube, and left IJ central line stable in position. Relatively low  lung volumes as before with some increase in scattered perihilar and bibasilar interstitial airspace opacities. Heart size upper limits normal. Suspect small left pleural effusion, new since previous. Post right shoulder arthroplasty. Peripherally calcified breast implants. Cement augmentation in an upper lumbar vertebral body. IMPRESSION: 1. Low lung volumes with increasing perihilar and bibasilar interstitial opacities. 2. Suspect small left pleural effusion. Electronically Signed   By: Corlis Leak M.D.   On: 02/17/2023 07:46   CT HEAD WO CONTRAST ( )  Result Date: 02/16/2023 CLINICAL DATA:  Subdural hematoma, follow-up EXAM: CT HEAD WITHOUT CONTRAST TECHNIQUE: Contiguous axial images were obtained from the base of the skull through the vertex without intravenous contrast. RADIATION DOSE REDUCTION: This exam was performed according to the departmental dose-optimization program which includes automated exposure control, adjustment of the mA and/or kV according to patient size and/or use of iterative reconstruction technique. COMPARISON:  02/16/2023 CT head FINDINGS: Brain: Status post interval left frontoparietal craniotomy for evacuation of the previously noted subdural hematoma. A drain is noted terminating in the left subdural space. Pneumocephalus along the left frontal convexity, with a small subdural collection measuring up to 6 mm (series 2, image 17). Hemorrhage is again noted along the anterior falx, measuring to 10 mm, previously 14 mm; along the posterior falx, measuring up to 8 mm, previously 8 mm; an along the left tentorium, measuring up to 7 mm, previously 8 mm. 8 mm left to right midline shift, previously 17 mm when remeasured similarly. Decreased mass effect on the lateral and third ventricles. The basilar cisterns are patent. No evidence of acute infarct, parenchymal hemorrhage, or mass. No hydrocephalus. Unchanged mixed density subdural collection over the right frontal convexity, which  measures up to 6 mm, unchanged. Vascular: No hyperdense vessel. Skull: Left frontotemporal craniotomy. Superficial skin staples. Air in the soft tissues is not unexpected postoperatively. Sinuses/Orbits: Clear paranasal sinuses. Status post bilateral lens replacements. Other: The mastoids are well aerated. IMPRESSION: 1. Status post left frontoparietal craniotomy for evacuation of the previously noted left subdural hematoma, with decreased midline shift, now 8 mm, and decreased mass effect on the  lateral and third ventricles. 2. Pneumocephalus and a small subdural collection along the left frontal convexity, with stable to slightly decreased hematomas along the falx and tentorium. 3. Unchanged mixed density subdural collection over the right frontal convexity, which measures up to 6 mm. Electronically Signed   By: Wiliam Ke M.D.   On: 02/16/2023 21:45   DG Abd 1 View  Result Date: 02/16/2023 CLINICAL DATA:  Placement of enteric tube EXAM: ABDOMEN - 1 VIEW COMPARISON:  09/13/2004 FINDINGS: Bowel gas pattern is nonspecific. There is moderate gaseous distention of transverse colon. Tip of enteric tube is seen in the region of the antrum of the stomach. There is contrast in the pelvocaliceal systems related to previous CT angiogram. There is kyphoplasty in lower thoracic spine, possibly T12 vertebra. There is evidence of previous ventral hernia repair. IMPRESSION: Tip of the enteric tube is seen in the region of antrum of the stomach. Electronically Signed   By: Ernie Avena M.D.   On: 02/16/2023 15:29   DG Chest Port 1 View  Result Date: 02/16/2023 CLINICAL DATA:  Difficulty breathing, status post intubation EXAM: PORTABLE CHEST 1 VIEW COMPARISON:  Previous studies including the examination none on 02/07/2023 FINDINGS: Transverse diameter of heart is increased. The central pulmonary vessels are more prominent. Increased interstitial markings are seen in both lungs, more so on the right side. There is  blunting of both lateral CP angles. There is no pneumothorax. There is interval placement of endotracheal tube with its tip 4 cm above the carina. There is interval placement of left IJ central venous catheter with its tip in superior vena cava. There is interval placement of enteric tube with its distal portion in the stomach. There is previous reverse arthroplasty right shoulder. There is dense calcification in bilateral breast implants. IMPRESSION: Cardiomegaly. Increased interstitial and alveolar markings are seen in both lungs, more so on the right side. Findings may suggest asymmetric pulmonary edema or multifocal pneumonia. Possible small bilateral pleural effusions. Support devices as described. Electronically Signed   By: Ernie Avena M.D.   On: 02/16/2023 15:27   CT ANGIO HEAD NECK W WO CM (CODE STROKE)  Result Date: 02/16/2023 CLINICAL DATA:  Neuro deficit, acute, stroke suspected EXAM: CT ANGIOGRAPHY HEAD AND NECK WITH AND WITHOUT CONTRAST TECHNIQUE: Multidetector CT imaging of the head and neck was performed using the standard protocol during bolus administration of intravenous contrast. Multiplanar CT image reconstructions and MIPs were obtained to evaluate the vascular anatomy. Carotid stenosis measurements (when applicable) are obtained utilizing NASCET criteria, using the distal internal carotid diameter as the denominator. RADIATION DOSE REDUCTION: This exam was performed according to the departmental dose-optimization program which includes automated exposure control, adjustment of the mA and/or kV according to patient size and/or use of iterative reconstruction technique. CONTRAST:  75mL OMNIPAQUE IOHEXOL 350 MG/ML SOLN COMPARISON:  Same day CT head FINDINGS: CT HEAD FINDINGS See same day CT brain for intracranial findings. Note that the presence of iodinated contrast limits the ability to assess for intracranial hemorrhage. Redemonstrated is a large left cerebral convexity subdural  hematoma measuring up to 2.2 cm with 1.5 cm rightward midline shift. The size of the right temporal horn has increased compared to recent prior head CT dated 06/24, which raises the possibility for ventricular entrapment. There is evidence of left-sided uncal herniation. Subdural blood products extending along the falx, left tentorial leaflet, and are also seen along the right frontal convexity (series 5, image 41). CTA NECK FINDINGS Aortic arch: Standard  branching. Imaged portion shows no evidence of aneurysm or dissection. No significant stenosis of the major arch vessel origins. Right carotid system: No evidence of dissection, stenosis (50% or greater), or occlusion. Left carotid system: No evidence of dissection, stenosis (50% or greater), or occlusion. Vertebral arteries: Codominant. No evidence of dissection, stenosis (50% or greater), or occlusion. Skeleton: Negative. Other neck: Negative. Upper chest: Partially imaged bilateral breast implants. The main pulmonary artery measures up to 3.4 cm, which can be seen in the setting of pulmonary artery hypertension. Biapical ground-glass opacities are favored to be infectious or inflammatory. Right shoulder arthroplasty. The esophagus is patulous, which places the patient at risk for aspiration. Review of the MIP images confirms the above findings CTA HEAD FINDINGS Anterior circulation: No significant stenosis, proximal occlusion, aneurysm, or vascular malformation. Posterior circulation: No significant stenosis, proximal occlusion, aneurysm, or vascular malformation. Venous sinuses: As permitted by contrast timing, patent. Anatomic variants: Fetal PCA on the left. Review of the MIP images confirms the above findings IMPRESSION: 1. No intracranial large vessel occlusion or significant stenosis. 2. No hemodynamically significant stenosis in the neck. 3. Redemonstrated is a large left cerebral convexity subdural hematoma measuring up to 2.2 cm with 1.5 cm rightward  midline shift. The size of the right temporal horn has increased compared to recent prior head CT dated 06/24, which raises the possibility for early ventricular entrapment. There is evidence of left-sided uncal herniation. 4. Subdural blood products extending along the falx, left tentorial leaflet, and are also seen along the right frontal convexity. 5. The main pulmonary artery measures up to 3.4 cm, which can be seen in the setting of pulmonary artery hypertension. 6. Biapical ground-glass opacities are favored to be infectious or inflammatory. 7. The esophagus is patulous, which places the patient at risk for aspiration. Electronically Signed   By: Lorenza Cambridge M.D.   On: 02/16/2023 13:55   CT HEAD WO CONTRAST ( )  Result Date: 02/16/2023 CLINICAL DATA:  Headache, increasing frequency or severity. EXAM: CT HEAD WITHOUT CONTRAST TECHNIQUE: Contiguous axial images were obtained from the base of the skull through the vertex without intravenous contrast. RADIATION DOSE REDUCTION: This exam was performed according to the departmental dose-optimization program which includes automated exposure control, adjustment of the mA and/or kV according to patient size and/or use of iterative reconstruction technique. COMPARISON:  Head CT 02/07/2023 FINDINGS: Brain: A new acute large subdural hematoma over the left cerebral convexity measures up to 2.1 cm in thickness. There is prominent mass effect on the left cerebral hemisphere with partial effacement of the lateral and third ventricles, 1.6 cm of rightward midline shift, and left uncal herniation. Mild dilatation of the right lateral ventricle may reflect early trapping. Subdural hemorrhage extending along the falx measures up to 1.5 cm in thickness, and subdural hemorrhage extending along the left tentorium measures up to 0.6 cm in thickness. A new small mixed density subdural hematoma anteriorly over the right frontal convexity measures 0.5 cm in thickness. 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 No acute infarct is identified. Vascular: Calcified atherosclerosis at the skull base. Skull: No acute fracture or suspicious osseous lesion. Sinuses/Orbits: Visualized paranasal sinuses in mastoid air cells are clear. Bilateral cataract extraction. Other: None. Critical Value/emergent results were called by telephone at the time of interpretation on 02/16/2023 at 1:23 pm to Dr. Marcelino Duster, who verbally acknowledged these results. IMPRESSION: New large left and small right subdural hematomas with mass effect as  above, including 1.6 cm of rightward midline shift and left uncal herniation. Electronically Signed   By: Sebastian Ache M.D.   On: 02/16/2023 13:25    Cardiac Studies   See above  Patient Profile     77 y.o. female admitted with subdural hematoma s/p evacuation with PMVT  Assessment & Plan    TDP/long QT - she had long runs of torsades yesterday but these have quieted down with isuprel 1 mic/min. Continue. Avoid all QT prolonging meds. Atrial fib - she is maintaining NSR.  VDRF - as per CCM sevice Subdural - s/p evacuation.      For questions or updates, please contact [facility] Please consult [email or web address] for contact info under Cardiology/STEMI.      Signed, Lewayne Bunting, MD  02/18/2023, 12:31 PM

## 2023-02-19 ENCOUNTER — Inpatient Hospital Stay: Payer: Medicare Other

## 2023-02-19 DIAGNOSIS — Z7189 Other specified counseling: Secondary | ICD-10-CM | POA: Diagnosis not present

## 2023-02-19 DIAGNOSIS — I959 Hypotension, unspecified: Secondary | ICD-10-CM | POA: Diagnosis not present

## 2023-02-19 DIAGNOSIS — I4721 Torsades de pointes: Secondary | ICD-10-CM

## 2023-02-19 DIAGNOSIS — I4891 Unspecified atrial fibrillation: Secondary | ICD-10-CM | POA: Diagnosis not present

## 2023-02-19 LAB — RENAL FUNCTION PANEL
Albumin: 2.1 g/dL — ABNORMAL LOW (ref 3.5–5.0)
Anion gap: 6 (ref 5–15)
BUN: 6 mg/dL — ABNORMAL LOW (ref 8–23)
CO2: 23 mmol/L (ref 22–32)
Calcium: 7.5 mg/dL — ABNORMAL LOW (ref 8.9–10.3)
Chloride: 100 mmol/L (ref 98–111)
Creatinine, Ser: 0.56 mg/dL (ref 0.44–1.00)
GFR, Estimated: >60 mL/min (ref >60)
Glucose, Bld: 132 mg/dL — ABNORMAL HIGH (ref 70–99)
Phosphorus: 3 mg/dL (ref 2.5–4.6)
Potassium: 3.6 mmol/L (ref 3.5–5.1)
Sodium: 129 mmol/L — ABNORMAL LOW (ref 135–145)

## 2023-02-19 LAB — CBC
HCT: 25.8 % — ABNORMAL LOW (ref 36.0–46.0)
Hemoglobin: 8.3 g/dL — ABNORMAL LOW (ref 12.0–15.0)
MCH: 26.4 pg (ref 26.0–34.0)
MCHC: 32.2 g/dL (ref 30.0–36.0)
MCV: 82.2 fL (ref 80.0–100.0)
Platelets: 190 10*3/uL (ref 150–400)
RBC: 3.14 MIL/uL — ABNORMAL LOW (ref 3.87–5.11)
RDW: 16.3 % — ABNORMAL HIGH (ref 11.5–15.5)
WBC: 15.4 10*3/uL — ABNORMAL HIGH (ref 4.0–10.5)
nRBC: 0 % (ref 0.0–0.2)

## 2023-02-19 LAB — CULTURE, BLOOD (ROUTINE X 2)
Culture: NO GROWTH
Culture: NO GROWTH

## 2023-02-19 LAB — MAGNESIUM: Magnesium: 2 mg/dL (ref 1.7–2.4)

## 2023-02-19 LAB — GLUCOSE, CAPILLARY
Glucose-Capillary: 119 mg/dL — ABNORMAL HIGH (ref 70–99)
Glucose-Capillary: 115 mg/dL — ABNORMAL HIGH (ref 70–99)
Glucose-Capillary: 137 mg/dL — ABNORMAL HIGH (ref 70–99)

## 2023-02-19 MED ORDER — ACETAMINOPHEN 325 MG PO TABS: 650.0000 mg | ORAL_TABLET | Freq: Four times a day (QID) | ORAL | Status: DC | PRN

## 2023-02-19 MED ORDER — MORPHINE SULFATE (PF) 2 MG/ML IV SOLN
2.0000 mg | INTRAVENOUS | Status: DC | PRN
  Administered 2023-02-19 – 2023-02-20 (×6): 4 mg via INTRAVENOUS
  Administered 2023-02-20: 2 mg via INTRAVENOUS
  Administered 2023-02-20 (×2): 4 mg via INTRAVENOUS
  Administered 2023-02-20: 2 mg via INTRAVENOUS
  Administered 2023-02-20 (×3): 4 mg via INTRAVENOUS
  Administered 2023-02-20: 2 mg via INTRAVENOUS
  Administered 2023-02-20: 4 mg via INTRAVENOUS
  Administered 2023-02-21 – 2023-02-22 (×5): 2 mg via INTRAVENOUS
  Filled 2023-02-19 (×3): qty 2
  Filled 2023-02-19: qty 1
  Filled 2023-02-19 (×2): qty 2
  Filled 2023-02-19 (×2): qty 1
  Filled 2023-02-19 (×6): qty 2
  Filled 2023-02-19 (×3): qty 1
  Filled 2023-02-19 (×2): qty 2
  Filled 2023-02-19: qty 1

## 2023-02-19 MED ORDER — GLYCOPYRROLATE 1 MG PO TABS
1.0000 mg | ORAL_TABLET | ORAL | Status: DC | PRN
  Filled 2023-02-19: qty 1

## 2023-02-19 MED ORDER — DILTIAZEM HCL-DEXTROSE 125-5 MG/125ML-% IV SOLN (PREMIX)
5.0000 mg/h | INTRAVENOUS | Status: DC
  Administered 2023-02-19: 5 mg/h via INTRAVENOUS
  Administered 2023-02-19: 15 mg/h via INTRAVENOUS
  Filled 2023-02-19 (×2): qty 125

## 2023-02-19 MED ORDER — POTASSIUM CHLORIDE 20 MEQ PO PACK: 40.0000 meq | PACK | Freq: Once | ORAL | Status: DC

## 2023-02-19 MED ORDER — GLYCOPYRROLATE 0.2 MG/ML IJ SOLN
0.2000 mg | INTRAMUSCULAR | Status: DC | PRN
  Administered 2023-02-19 – 2023-02-21 (×8): 0.2 mg via INTRAVENOUS
  Filled 2023-02-19 (×9): qty 1

## 2023-02-19 MED ORDER — GLYCOPYRROLATE 0.2 MG/ML IJ SOLN
0.2000 mg | INTRAMUSCULAR | Status: DC | PRN
  Filled 2023-02-19: qty 1

## 2023-02-19 MED ORDER — DILTIAZEM HCL 25 MG/5ML IV SOLN
5.0000 mg | Freq: Once | INTRAVENOUS | Status: AC
  Administered 2023-02-19: 5 mg via INTRAVENOUS
  Filled 2023-02-19: qty 5

## 2023-02-19 MED ORDER — POLYVINYL ALCOHOL 1.4 % OP SOLN: 1.0000 [drp] | Freq: Four times a day (QID) | OPHTHALMIC | Status: DC | PRN

## 2023-02-19 MED ORDER — ACETAMINOPHEN 650 MG RE SUPP: 650.0000 mg | Freq: Four times a day (QID) | RECTAL | Status: DC | PRN

## 2023-02-19 MED ORDER — SODIUM CHLORIDE 0.9 % IV SOLN: INTRAVENOUS | Status: DC

## 2023-02-19 NOTE — Progress Notes (Signed)
Drain and dressing removed.  Patient transitioning to comfort care, so will stop actively following.  Please call if any neurosurgical issues.

## 2023-02-19 NOTE — Progress Notes (Signed)
Rounding Note    Patient Name: [name] Date of Encounter: 02/19/2023  [facility] Cardiologist: Mariah Milling  Subjective   Patient remains intubated and is unresponsive despite being off sedation.  Son is at the bedside and has multiple questions regarding her arrhythmias.  Inpatient Medications    Scheduled Meds:  Chlorhexidine Gluconate Cloth  6 each Topical Q0600   cholecalciferol  2,000 Units Per Tube Daily   docusate  100 mg Per Tube BID   enoxaparin (LOVENOX) injection  50 mg Subcutaneous Q24H   furosemide  20 mg Per Tube Daily   insulin aspart  0-15 Units Subcutaneous Q4H   levothyroxine  100 mcg Per Tube Daily   mouth rinse  15 mL Mouth Rinse Q2H   oxyCODONE  10 mg Per Tube Q8H   pantoprazole (PROTONIX) IV  40 mg Intravenous Daily   polyethylene glycol  17 g Per Tube Daily   pregabalin  100 mg Per Tube BID   ramelteon  8 mg Oral QHS   valproic acid  125 mg Per Tube BID   vortioxetine HBr  20 mg Per Tube Daily   Continuous Infusions:  sodium chloride 10 mL/hr at 02/19/23 1000    ceFAZolin (ANCEF) IV Stopped (02/19/23 0553)   diltiazem (CARDIZEM) infusion 15 mg/hr (02/19/23 1020)   fentaNYL infusion INTRAVENOUS Stopped (02/19/23 0905)   isoproterenol (ISUPREL) 1 mg in dextrose 5 % 250 mL (0.004 mg/mL) infusion 1 mcg/min (02/19/23 1000)   norepinephrine (LEVOPHED) Adult infusion 4 mcg/min (02/19/23 1000)   propofol (DIPRIVAN) infusion Stopped (02/19/23 0905)   PRN Meds: acetaminophen **OR** acetaminophen, fentaNYL, hydrocortisone cream, iohexol, midazolam, mouth rinse, mouth rinse, oxyCODONE   Vital Signs    Vitals:   02/19/23 0815 02/19/23 0830 02/19/23 0845 02/19/23 0900  BP: (!) 94/55 96/62 93/60  (!) 88/53  Pulse: 92 63 84 80  Resp: 18 18 18 18   Temp: 98.6 F (37 C) 98.4 F (36.9 C) 98.4 F (36.9 C) 98.4 F (36.9 C)  TempSrc:      SpO2: 100% 100% 100% 99%  Weight:      Height:        Intake/Output Summary (Last 24 hours) at 02/19/2023  1144 Last data filed at 02/19/2023 1000 Gross per 24 hour  Intake 2085.34 ml  Output 2805 ml  Net -719.66 ml      02/19/2023    5:00 AM 02/18/2023    5:00 AM 02/17/2023    5:18 AM  Last 3 Weights  Weight (lbs) 221 lb 1.9 oz 225 lb 15.5 oz 221 lb 1.9 oz  Weight (kg) 100.3 kg 102.5 kg 100.3 kg      Telemetry    Sinus rhythm until 12:58 AM, when the patient converted to atrial fibrillation with rapid ventricular response.  She remains in atrial fibrillation with ventricular rates around 80 bpm. - Personally Reviewed  ECG    Atrial fibrillation with rapid ventricular response, low voltage, and nonspecific T wave abnormalities. - Personally Reviewed  Physical Exam   GEN: Intubated and unresponsive. Neck: Unable to assess JVP due to positioning and support devices. Cardiac: Irregularly irregular without murmurs. Respiratory: Clear to auscultation bilaterally. GI: Soft, nontender, non-distended  MS: No edema; No deformity. Neuro: Intubated and unresponsive (off sedation). Psych: Intubated and unresponsive (off sedation).  Labs    High Sensitivity Troponin:   Recent Labs  Lab 02/07/23 2227 02/08/23 0047 02/16/23 1242  TROPONINIHS 9 10 64*     Chemistry Recent Labs  Lab  02/16/23 1239 02/16/23 1241 02/17/23 0522 02/17/23 1452 02/18/23 0520 02/19/23 0522 02/19/23 0529  NA 130*   < > 131* 128* 128*  --  129*  K 3.6   < > 3.3* 4.7 5.1  --  3.6  CL 94*   < > 98 96* 102  --  100  CO2 23   < > 25 23 21*  --  23  GLUCOSE 203*   < > 150* 150* 153*  --  132*  BUN 11   < > 10 8 7*  --  6*  CREATININE 0.81   < > 0.72 0.73 0.66  --  0.56  CALCIUM 8.5*   < > 8.1* 7.7* 7.3*  --  7.5*  MG  --    < >  --  1.7 2.3 2.0  --   PROT 7.1  --   --   --   --   --   --   ALBUMIN 3.3*  --  2.8*  --  2.4*  --  2.1*  AST 21  --   --   --   --   --   --   ALT 11  --   --   --   --   --   --   ALKPHOS 87  --   --   --   --   --   --   BILITOT 0.6  --   --   --   --   --   --   GFRNONAA >60    < > >60 >60 >60  --  >60  ANIONGAP 13   < > 8 9 5   --  6   < > = values in this interval not displayed.    Lipids  Recent Labs  Lab 02/17/23 0522  TRIG 94    Hematology Recent Labs  Lab 02/17/23 0522 02/18/23 0520 02/19/23 0529  WBC 12.4* 21.0* 15.4*  RBC 3.72* 3.32* 3.14*  HGB 9.8* 8.8* 8.3*  HCT 30.4* 27.4* 25.8*  MCV 81.7 82.5 82.2  MCH 26.3 26.5 26.4  MCHC 32.2 32.1 32.2  RDW 15.9* 16.1* 16.3*  PLT 190 184 190   Thyroid No results for input(s): "TSH", "FREET4" in the last 168 hours.  BNPNo results for input(s): "BNP", "PROBNP" in the last 168 hours.  DDimer No results for input(s): "DDIMER" in the last 168 hours.   Radiology    No results found.  Cardiac Studies   TTE (02/08/2023):  1. Left ventricular ejection fraction, by estimation, is 60 to 65%. The  left ventricle has normal function. The left ventricle has no regional  wall motion abnormalities. There is mild left ventricular hypertrophy.  Left ventricular diastolic parameters  are indeterminate.   2. Right ventricular systolic function is normal. The right ventricular  size is normal. There is mildly elevated pulmonary artery systolic  pressure.   3. Left atrial size was moderately dilated.   4. The mitral valve is normal in structure. Mild to moderate mitral valve  regurgitation. No evidence of mitral stenosis.   5. The aortic valve has an indeterminant number of cusps. Aortic valve  regurgitation is mild to moderate. No aortic stenosis is present.   6. The inferior vena cava is normal in size with greater than 50%  respiratory variability, suggesting right atrial pressure of 3 mmHg.   7. Agitated saline contrast bubble study was negative, with no evidence  of any interatrial shunt.  TEE (02/13/2023):  1. Left ventricular ejection fraction, by estimation, is 50 to 55%. The  left ventricle has low normal function. The left ventricle has no regional  wall motion abnormalities.   2. Right  ventricular systolic function is low normal. The right  ventricular size is normal.   3. Left atrial size was moderately dilated. No left atrial/left atrial  appendage thrombus was detected.   4. Right atrial size was moderately dilated.   5. The mitral valve is normal in structure. Moderate mitral valve  regurgitation. No evidence of mitral stenosis.   6. The aortic valve is tricuspid. Aortic valve regurgitation is mild to  moderate. No aortic stenosis is present.   7. The inferior vena cava is normal in size with greater than 50%  respiratory variability, suggesting right atrial pressure of 3 mmHg.   8. Agitated saline contrast bubble study was positive with shunting  observed within 3-6 cardiac cycles suggestive of interatrial shunt. There  is a small patent foramen ovale with predominantly right to left shunting  across the atrial septum.   Patient Profile     77 y.o. female seizures, hypothyroidism, depression, and multiple falls, admitted after unwitnessed fall and found to be in atrial fibrillation with rapid ventricular response now complicated by subdural hematoma.  Assessment & Plan    Atrial fibrillation with rapid ventricular response: Patient had been maintaining sinus rhythm but was on isoproterenol due to episodes of polymorphic ventricular tachycardia.  She went into atrial fibrillation with rapid ventricular response around 1 AM and is currently on diltiazem and isoproterenol infusions. -Discontinue isoproterenol and titrate down diltiazem for target heart rate of 80-110 bpm. -Unable to anticoagulate in the setting of subdural hematoma. -Avoid amiodarone and other QT prolonging medications in the setting of polymorphic ventricular tachycardia.  Polymorphic ventricular tachycardia: No further episode of Tors[place] for over 24 hours.  Unfortunately, the patient went into atrial fibrillation with rapid ventricular response with isoproterenol infusion. -Hold isoproterenol  with titration of diltiazem as above. -Avoid QT prolonging medications. -Continue telemetry monitoring. -Replete potassium and magnesium levels to greater than 4.0 and 2.0, respectively.  Hypotension: Likely driven by combination of factors including ongoing diltiazem infusion. -Wean diltiazem as tolerated. -Continue titration of norepinephrine per CCM/neurosurgery parameters.  Subdural hematoma: Patient status postevacuation during this.  She has not woken up despite being off sedation and remains ventilator dependent.  Plan for head CT today. -Ongoing management per CCM and neurosurgery.  CRITICAL CARE Performed by: Yvonne Kendall, MD  Total critical care time: 35 minutes  Critical care time was exclusive of separately billable procedures and treating other patients.  Critical care was necessary to treat or prevent imminent or life-threatening deterioration.  Critical care was time spent personally by me (independent of midlevel providers or residents) on the following activities: development of treatment plan with patient and/or surrogate as well as nursing, discussions with consultants, evaluation of patient's response to treatment, examination of patient, obtaining history from patient or surrogate, ordering and performing treatments and interventions, ordering and review of laboratory studies, ordering and review of radiographic studies, pulse oximetry and re-evaluation of patient's condition.  For questions or updates, please contact [facility] Please consult [email or web address] for contact info under [facility].   Signed, Yvonne Kendall, MD  02/19/2023, 11:44 AM

## 2023-02-19 NOTE — Progress Notes (Signed)
Daily Progress Note   Patient Name: [name]       Date: 02/19/2023 DOB: [birth year]  Age: 77 y.o. MRN#: [id number] Attending Physician: Chilton Greathouse, MD Primary Care Physician: Smiley Houseman, NP Admit Date: 02/07/2023  Reason for Consultation/Follow-up: Establishing goals of care  Subjective: Called to bedside to discuss one-way extubation versus reintubation, and current pressor support.  Notes and labs reviewed.  In to see patient.  Son is at bedside.  Son discusses that his mother has been living in assisted living prior to admission.  He states that she was not adjusting well to assisted living.  Son states that she has always been fiercely independent and did not like having anyone in her apartment to help her.  He discusses that she did not even want someone standing outside the bathroom door when she took a shower and yelled at them and demanded that they leave.  He states she had baseline confusion.  We discussed her diagnosis, prognosis, GOC, EOL wishes disposition and options.  Created space and opportunity for patient  to explore thoughts and feelings regarding current medical information.   A detailed discussion was had today regarding advanced directives.  Concepts specific to code status, artifical feeding and hydration, IV antibiotics and rehospitalization were discussed.  The difference between an aggressive medical intervention path and a comfort care path was discussed.  Values and goals of care important to patient and family were attempted to be elicited.  Discussed limitations of medical interventions to prolong quality of life in some situations and discussed the concept of human mortality.  Son states that his mother had been brought up [facility], and had  departed from her faith for a while, but had resumed it since being at the facility.  He states one of the only things that she would leave her room for, was to go to  Sunday service at the facility.  He discusses that his father lives in a nursing facility due to physical mobility issues.  He states his father is of strong [employer] faith.  He states that in the past 4 years his father has only seen his mother around 5 times.  He states they talk on the phone frequently.  He states they have been married over 50 years.  Son states his mother has  made hyperbolic statements in the past that he now understands.  With discussion of her status, previous quality of life, and best quality of life in the future, he states he would like one-way extubation and to switch to comfort care.  He states he will talk with his father about ability to see her or E link her prior to withdrawal of life prolonging care.  Attending team made aware.   Length of Stay: 11  Current Medications: Scheduled Meds:   Chlorhexidine Gluconate Cloth  6 each Topical Q0600   cholecalciferol  2,000 Units Per Tube Daily   docusate  100 mg Per Tube BID   enoxaparin (LOVENOX) injection  50 mg Subcutaneous Q24H   furosemide  20 mg Per Tube Daily   insulin aspart  0-15 Units Subcutaneous Q4H   levothyroxine  100 mcg Per Tube Daily   mouth rinse  15 mL Mouth Rinse Q2H   oxyCODONE  10 mg Per Tube Q8H   pantoprazole (PROTONIX) IV  40 mg Intravenous Daily   polyethylene glycol  17 g Per Tube Daily   pregabalin  100 mg Per Tube BID   ramelteon  8 mg Oral QHS   valproic acid  125 mg Per Tube BID   vortioxetine HBr  20 mg Per Tube Daily    Continuous Infusions:  sodium chloride 10 mL/hr at 02/19/23 1200    ceFAZolin (ANCEF) IV Stopped (02/19/23 0553)   diltiazem (CARDIZEM) infusion 5 mg/hr (02/19/23 1200)   fentaNYL infusion INTRAVENOUS 100 mcg/hr (02/19/23 1200)   isoproterenol (ISUPREL) 1 mg in dextrose 5 % 250 mL (0.004 mg/mL)  infusion Stopped (02/19/23 1110)   norepinephrine (LEVOPHED) Adult infusion 6 mcg/min (02/19/23 1200)   propofol (DIPRIVAN) infusion 20 mcg/kg/min (02/19/23 1200)    PRN Meds: acetaminophen **OR** acetaminophen, fentaNYL, hydrocortisone cream, iohexol, midazolam, mouth rinse, mouth rinse, oxyCODONE  Physical Exam Constitutional:      Comments: Eyes closed.  On ventilator.             Vital Signs: BP (!) 88/53   Pulse 80   Temp 98.4 F (36.9 C)   Resp 18   Ht 5\' 5"  (1.651 m)   Wt 100.3 kg   SpO2 99%   BMI 36.80 kg/m  SpO2: SpO2: 99 % O2 Device: O2 Device: Ventilator O2 Flow Rate: O2 Flow Rate (L/min): 2 L/min  Intake/output summary:  Intake/Output Summary (Last 24 hours) at 02/19/2023 1227 Last data filed at 02/19/2023 1200 Gross per 24 hour  Intake 2130.24 ml  Output 2795 ml  Net -664.76 ml   LBM: Last BM Date : 02/15/23 Baseline Weight: Weight: 102 kg Most recent weight: Weight: 100.3 kg        Patient Active Problem List   Diagnosis Date Noted   Nonintractable headache 02/16/2023   Other headache syndrome 02/16/2023   Shortness of breath 02/13/2023   Fall 02/08/2023   Atrial fibrillation with RVR (HCC) 02/08/2023   Acute cystitis without hematuria 02/07/2023   Paroxysmal atrial fibrillation (HCC) 02/07/2023   AKI (acute kidney injury) (HCC) 02/07/2023   Hypokalemia 02/07/2023   Falls 11/20/2022   Gastroesophageal reflux disease 09/24/2022   Dysuria 10/24/2021   Morbid (severe) obesity due to excess calories (HCC) 10/04/2021   Chronic migraine w/o aura w/o status migrainosus, not intractable 05/19/2021   Dementia with behavioral disturbance (HCC) 05/19/2021   Folliculitis 12/27/2020   Rash 12/27/2020   Abnormal weight loss 10/22/2020   Morbid obesity (HCC) 10/22/2020   Nausea 10/22/2020  Long-term current use of opiate analgesic 12/01/2019   Encounter for Medicare annual wellness exam 12/25/2018   Chronic back pain 09/05/2018   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   History of gastrointestinal hemorrhage 09/05/2018   Insomnia 09/05/2018   Cognitive impairment 05/07/2018   Age related osteoporosis 03/12/2018   On long term drug therapy 09/07/2017   Left-sided chest wall pain 07/25/2017   Hypotension 01/22/2017   Cerebrovascular accident (CVA) due to thrombosis of precerebral artery (HCC) 12/05/2016   Neck pain 12/05/2016   Gait instability 12/05/2016   Paronychia of finger of right hand 08/22/2016   Osteoarthritis of right knee 11/22/2015   Right knee pain 10/14/2015   Actinic keratoses 07/02/2015   Chest wall tenderness 04/27/2014   Foot drop, left 04/14/2014   Traumatic injury of lower extremity 03/25/2014   Obesity (BMI 30.0-34.9) 01/30/2014   Healthcare maintenance 01/30/2014   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   Diastolic CHF (HCC) 02/27/2013   Fibromyalgia 08/14/2012   Numbness in feet 08/08/2012   GERD with stricture 03/04/2012   Leg edema 03/04/2012   CATARACT, LEFT EYE 08/23/2010   Abnormality of gait 08/23/2010   Osteoarthritis 02/24/2010   Iron deficiency anemia 03/06/2009   DEPENDENCE, OPIOID, 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  He would like one-way extubation and to shift to comfort care after his father has been able to spend time with his mother.  CCM made aware  Code Status:    Code Status Orders  (From admission, onward)           Start     Ordered   02/17/23 1609  Do not attempt resuscitation (DNR)  Continuous       Question Answer Comment  If patient  has no pulse and is not breathing Do Not Attempt Resuscitation   If patient has a pulse and/or is breathing: Medical Treatment Goals MEDICAL INTERVENTIONS DESIRED: Use advanced airway interventions, mechanical ventilation or cardioversion in appropriate circumstances; Use medication/IV fluids as indicated; Provide comfort medications; Transfer to Progressive/Stepdown/ICU as indicated.   Consent: Discussion documented in EHR or advanced directives reviewed      02/17/23 1612           Code Status History     Date Active Date Inactive Code Status Order ID Comments User Context   02/08/2023 0040 02/17/2023 1612 Full Code [id number]  Gertha Calkin, MD ED   05/16/2013 1411 05/20/2013 2108 Full Code [id number]  Verlee Rossetti, MD Inpatient      Advance Directive Documentation    Flowsheet Row Most Recent Value  Type of Advance Directive Living will  Pre-existing out of facility DNR order (yellow form or pink MOST form) --  "MOST" Form in Place? --       Prognosis:  Hours - Days    Thank you for allowing the Palliative Medicine Team to assist in the care of this patient.   Morton Stall, NP  Please contact Palliative Medicine Team phone at [phone] for questions and concerns.

## 2023-02-19 NOTE — Progress Notes (Signed)
Patient extubated @ 1515 per MD orders for comfort measures.

## 2023-02-19 NOTE — Telephone Encounter (Signed)
Patient is still admitted.

## 2023-02-19 NOTE — Progress Notes (Signed)
   02/19/23 1200  Spiritual Encounters  Type of Visit Initial  Care provided to: [facility] partners present during encounter Nurse  Referral source IDT Rounds  Reason for visit Routine spiritual support  OnCall Visit No  Spiritual Framework  Presenting Themes Coping tools;Courage hope and growth  Community/Connection Family  Patient Stress Factors None identified  Family Stress Factors None identified  Interventions  Spiritual Care Interventions Made Reflective listening;Supported grief process   Chaplain met with patient's son [name] to check on him and the patient. [name] told the chaplain that he is doing the best that he can. Chaplain offered words of encouragement and told him that if we like to talk more or any other type of support that chaplain services is available.

## 2023-02-19 NOTE — Progress Notes (Signed)
  Interdisciplinary Goals of Care Family Meeting   Date carried out:: 02/19/2023  Location of the meeting: Bedside  Member's involved: Physician, Bedside Registered Nurse, and Family Member or next of kin  Durable Power of Attorney or acting medical decision maker:   [name]  Discussion: We discussed goals of care for [name] .  Mary Glassman previously had a discussion with palliative care and he has confirmed with me that his mother would not want to be supported like this if she does not have quality of life and transition to comfort measures Orders placed for compassionate extubation and comfort measures.  Code status: Full DNR  Disposition: In-patient comfort care   Time spent for the meeting: 10 mins  [name] 02/19/2023, 3:01 PM

## 2023-02-19 NOTE — Progress Notes (Signed)
Other than going back in to Afib RVR, no acute events overnight, plus urine output has improved.

## 2023-02-19 NOTE — Progress Notes (Addendum)
NAME:  [name], MRN:  [id number], DOB:  [birth year], LOS: 11 ADMISSION DATE:  02/07/2023, CONSULTATION DATE:  02/16/2023 REFERRING MD:  , CHIEF COMPLAINT:  A fib RVR, subdural hematoma  History of Present Illness:   This is a 77 year old female with a history of atrial fibrillation with rapid ventricular response, hypothyroidism, epilepsy, anxiety and depression, history of recurrent falls on multiple centrally acting medications came in with findings of atrial fibrillation rapid ventricular response was initially seen by cardiology medically treated placed on blood thinners with Eliquis underwent transesophageal echo with cardioversion.  She is being treated for diastolic CHF as well has UTI.  She does have a history of chronic diarrhea which was also managed on the floors.  Initially had some improvement symptomatically was being diuresed on the floor then suddenly developed headaches and was treated with narcotics for this.  She had CT head performed which showed subdural hematoma and patient was brought in to medical intensive care unit lethargic unable to protect airway with active intracranial bleeding.  Multiple consultants were contacted including neurosurgery and plan is made to take patient to the operating room for bur hole procedure.   Pertinent  Medical History    has a past medical history of Anemia, Anxiety, Arthritis, Cancer (HCC), Depression, GERD (gastroesophageal reflux disease), Headache(784.0), Humerus fracture, Hyposmolality and/or hyponatremia (02/27/2013), Hypothyroidism, IBS (irritable bowel syndrome), Injury of left foot (09/05/2018), Injury of right hand (04/27/2014), Memory loss, Mental disorder, Migraine, Preoperative examination (02/26/2013), Seizures (HCC), and Shortness of breath.   Significant Hospital Events: Including procedures, antibiotic start and stop dates in addition to other pertinent events   02/17/23- S/P neurosurgery JP drain in place with active drain.   CT head with interval improvement. VT this am s/p cardio eval. We have been in communication with cardiology throughout day with repeated treatments for arrythmia.  No SBT today per neurosurgery. She is on propofol and fentantyl, making adequate urine.    02/18/23- Patient is having SBT today.  She is slowly waking up.  Reviewed medical plan with NSGY, will start DVP ppx tommorow evening post removal of intracranial drain. Family at bedside discussed plan and answered questions.   Interim History / Subjective:   Remains on the ventilator.  Went back into A-fib with RVR overnight  Objective   Blood pressure 96/62, pulse 63, temperature 98.4 F (36.9 C), resp. rate 18, height 5\' 5"  (1.651 m), weight 100.3 kg, SpO2 100 %.    Vent Mode: PRVC FiO2 (%):  [28 %] 28 % Set Rate:  [18 bmp] 18 bmp Vt Set:  [480 mL] 480 mL PEEP:  [5 cmH20] 5 cmH20 Pressure Support:  [5 cmH20] 5 cmH20 Plateau Pressure:  [18 cmH20] 18 cmH20   Intake/Output Summary (Last 24 hours) at 02/19/2023 0836 Last data filed at 02/19/2023 0745 Gross per 24 hour  Intake 2118.62 ml  Output 2925 ml  Net -806.38 ml   Filed Weights   02/17/23 0518 02/18/23 0500 02/19/23 0500  Weight: 100.3 kg 102.5 kg 100.3 kg    Examination: Blood pressure 96/62, pulse 63, temperature 98.4 F (36.9 C), resp. rate 18, height 5\' 5"  (1.651 m), weight 100.3 kg, SpO2 100 %. Gen:      No acute distress HEENT:  EOMI, sclera anicteric Neck:     No masses; no thyromegaly. ETT Lungs:    Clear to auscultation bilaterally; normal respiratory effort CV:         Regular rate and rhythm; no murmurs Abd:      +  bowel sounds; soft, non-tender; no palpable masses, no distension Ext:    1+ edema; adequate peripheral perfusion Skin:      Warm and dry; no rash Neuro: alert and oriented x 3 Psych: normal mood and affect   Labs/imaging reviewed Significant for sodium 129, BUN/creatinine 6/0.56 WBC 15.4, hemoglobin 8.3, platelets 190 No new  imaging  Resolved Hospital Problem list     Assessment & Plan:  Acute subdural hematoma setting of anticoagulation S/p left craniotomy Anticoagulation has been held Follow drain output, prophylactic antibiotic coverage with cefazolin Appreciate help from neurosurgery  Atrial fibrillation with RVR, torsades Continue on Isoproterenol, Cardizem Norepi for BP support Monitor and replete electrolytes. Cardiology is on board.  Acute respiratory failure Continue vent support, pressure support weans as tolerated Follow intermittent chest x-ray.  Hypothyroidism Continue levothyroxine  Goals of care She is DNR.  There is ongoing discussions with the family about one-way extubation Palliative care is on board.  Best Practice (right click and "Reselect all SmartList Selections" daily)   Diet/type: tubefeeds DVT prophylaxis: LMWH GI prophylaxis: PPI Lines: Central line Foley:  Yes, and it is still needed Code Status:  full code Last date of multidisciplinary goals of care discussion [DNR]  Critical care time:    The patient is critically ill with multiple organ system failure and requires high complexity decision making for assessment and support, frequent evaluation and titration of therapies, advanced monitoring, review of radiographic studies and interpretation of complex data.   Critical Care Time devoted to patient care services, exclusive of separately billable procedures, described in this note is 35 minutes.   Chilton Greathouse MD [facility] See Amion for pager  If no response to pager , please call [phone] until 7pm After 7:00 pm call Elink  [phone] 02/19/2023, 8:53 AM

## 2023-02-19 NOTE — Progress Notes (Signed)
At 0104 heart rate jumped to the 150's, first sinus tach then afib rvr,  notified Ouma NP, awaiting orders.

## 2023-02-20 ENCOUNTER — Encounter: Payer: Self-pay | Admitting: Neurosurgery

## 2023-02-20 DIAGNOSIS — Z7189 Other specified counseling: Secondary | ICD-10-CM | POA: Diagnosis not present

## 2023-02-20 DIAGNOSIS — I4891 Unspecified atrial fibrillation: Secondary | ICD-10-CM | POA: Diagnosis not present

## 2023-02-20 LAB — THYROID PANEL
Free Thyroxine Index: 2 (ref 1.2–4.9)
T3 Uptake Ratio: 36 % (ref 24–39)
T4, Total: 5.6 ug/dL (ref 4.5–12.0)

## 2023-02-20 NOTE — Care Management Important Message (Signed)
Important Message  Patient Details  Name: JASPER VANGELDER MRN: [id number] Date of Birth: 09-Jan-1946   Medicare Important Message Given:  Other (see comment)  Patient is on comfort care and out of respect for the patient and family no Important Message from [facility] given.   Olegario Messier A Selim Durden 02/20/2023, 1:26 PM

## 2023-02-20 NOTE — Telephone Encounter (Signed)
Patient is transitioning to comfort care. Canceled post op appts. No need to schedule CT scan. Thanks

## 2023-02-20 NOTE — Progress Notes (Addendum)
Daily Progress Note   Patient Name: [name]       Date: 02/20/2023 DOB: 08-18-1946  Age: 77 y.o. MRN#: [id number] Attending Physician: Chilton Greathouse, MD Primary Care Physician: Smiley Houseman, NP Admit Date: 02/07/2023  Reason for Consultation/Follow-up: Establishing goals of care  Subjective: Notes reviewed.  In to see patient.  She has been moved out of ICU and is currently resting in bed with her son at bedside.  No distress noted.  Irregular breathing noted at times during visit.  Patient is not responsive.    Son states he was able to speak with his father, and his father was able to FaceTime but will not be able to come.  Son states his wife nor children will be able to come.  He discusses her health overall.  We reviewed what she would consider pain and suffering, and following the patient's wishes.  Anticipate hospital death.   No changes recommended to comfort medications.  Length of Stay: 12  Current Medications: Scheduled Meds:    Continuous Infusions:   PRN Meds: glycopyrrolate **OR** glycopyrrolate **OR** glycopyrrolate, hydrocortisone cream, midazolam, morphine injection, polyvinyl alcohol  Physical Exam Constitutional:      Comments: Eyes partially open.  Not responsive.  No distress noted.  Pulmonary:     Comments: Sometimes irregular breathing            Vital Signs: BP 112/74 (BP Location: Right Arm)   Pulse 86   Temp 98.3 F (36.8 C)   Resp 16   Ht 5\' 5"  (1.651 m)   Wt 100.3 kg   SpO2 93%   BMI 36.80 kg/m  SpO2: SpO2: 93 % O2 Device: O2 Device: Room Air O2 Flow Rate: O2 Flow Rate (L/min): 2 L/min  Intake/output summary:  Intake/Output Summary (Last 24 hours) at 02/20/2023 1213 Last data filed at 02/20/2023 7829 Gross per 24 hour  Intake  200 ml  Output 600 ml  Net -400 ml   LBM: Last BM Date : 02/15/23 Baseline Weight: Weight: 102 kg Most recent weight: Weight: 100.3 kg    Patient Active Problem List   Diagnosis Date Noted   Nonintractable headache 02/16/2023   Other headache syndrome 02/16/2023   Shortness of breath 02/13/2023   Fall 02/08/2023   Atrial fibrillation with RVR (HCC) 02/08/2023  Acute cystitis without hematuria 02/07/2023   Paroxysmal atrial fibrillation (HCC) 02/07/2023   AKI (acute kidney injury) (HCC) 02/07/2023   Hypokalemia 02/07/2023   Falls 11/20/2022   Gastroesophageal reflux disease 09/24/2022   Dysuria 10/24/2021   Morbid (severe) obesity due to excess calories (HCC) 10/04/2021   Chronic migraine w/o aura w/o status migrainosus, not intractable 05/19/2021   Dementia with behavioral disturbance (HCC) 05/19/2021   Folliculitis 12/27/2020   Rash 12/27/2020   Abnormal weight loss 10/22/2020   Morbid obesity (HCC) 10/22/2020   Nausea 10/22/2020   Long-term current use of opiate analgesic 12/01/2019   Encounter for Medicare annual wellness exam 12/25/2018   Chronic back pain 09/05/2018   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   History of gastrointestinal hemorrhage 09/05/2018   Insomnia 09/05/2018   Cognitive impairment 05/07/2018   Age related osteoporosis 03/12/2018   On long term drug therapy 09/07/2017   Left-sided chest wall pain 07/25/2017   Hypotension 01/22/2017   Cerebrovascular accident (CVA) due to thrombosis of precerebral artery (HCC) 12/05/2016   Neck pain 12/05/2016   Gait instability 12/05/2016   Paronychia of finger of right hand 08/22/2016   Osteoarthritis of right knee 11/22/2015   Right knee pain 10/14/2015   Actinic keratoses 07/02/2015   Chest wall tenderness 04/27/2014   Foot drop, left 04/14/2014   Traumatic injury of lower extremity 03/25/2014   Obesity (BMI 30.0-34.9) 01/30/2014   Healthcare maintenance 01/30/2014   Status post right shoulder  hemiarthroplasty 01/30/2014   Diastolic CHF (HCC) 02/27/2013   Fibromyalgia 08/14/2012   Numbness in feet 08/08/2012   GERD with stricture 03/04/2012   Leg edema 03/04/2012   CATARACT, LEFT EYE 08/23/2010   Abnormality of gait 08/23/2010   Osteoarthritis 02/24/2010   Iron deficiency anemia 03/06/2009   DEPENDENCE, OPIOID, 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/26/2006   SYMPTOM, INCONTINENCE W/O SENSORY AWARENESS 12/26/2006   Memory loss 12/26/2006   Hypothyroidism 11/01/2006   DEPRESSION, MAJOR, RECURRENT 11/01/2006   PANIC ATTACKS 11/01/2006   Migraine headache 11/01/2006   Carpal tunnel syndrome 11/01/2006   TMJ SYNDROME 11/01/2006   Irritable bowel syndrome 11/01/2006   OSTEOPENIA 11/01/2006    Palliative Care Assessment & Plan     Recommendations/Plan: Patient currently on comfort care and appears comfortable.  Irregular breathing noted at times during visit. Anticipate hospital death.  Code Status:    Code Status Orders  (From admission, onward)           Start     Ordered   02/19/23 1504  Do not attempt resuscitation (DNR)  Continuous       Question Answer Comment  If patient has no pulse and is not breathing Do Not Attempt Resuscitation   If patient has a pulse and/or is breathing: Medical Treatment Goals COMFORT MEASURES: Keep clean/warm/dry, use medication by any route; positioning, wound care and other measures to relieve pain/suffering; use oxygen, suction/manual treatment of airway obstruction for comfort; do not transfer unless for comfort needs.   Consent: Discussion documented in EHR or advanced directives reviewed      02/19/23 1504           Code Status History     Date Active Date Inactive Code Status Order ID Comments User Context   02/17/2023 1612 02/19/2023 1504 DNR [id number]  Theotis Burrow, NP Inpatient   02/08/2023 0040 02/17/2023 1612 Full Code [id number]  Gertha Calkin, MD ED   05/16/2013 1411 05/20/2013 2108 Full Code [id number]  Verlee Rossetti, MD Inpatient  Advance Directive Documentation    Flowsheet Row Most Recent Value  Type of Advance Directive Living will  Pre-existing out of facility DNR order (yellow form or pink MOST form) --  "MOST" Form in Place? --       Prognosis:  Hours - Days   Thank you for allowing the Palliative Medicine Team to assist in the care of this patient.   Morton Stall, NP  Please contact Palliative Medicine Team phone at [phone] for questions and concerns.

## 2023-02-20 NOTE — Progress Notes (Signed)
Brief Progress Note   Pt CMO currently resting comfortably with no signs of pain or distress.  Pts son at bedside he has no needs at this time.  Continue prn iv glycopyrrolate/versed/morphine for comfort.  Will transfer service to [facility] to pick up on 06/19.  Zada Girt, AGNP  Pulmonary/Critical Care Pager [phone] (please enter 7 digits) PCCM Consult Pager [phone] (please enter 7 digits)

## 2023-02-21 ENCOUNTER — Encounter: Payer: Self-pay | Admitting: Neurosurgery

## 2023-02-21 DIAGNOSIS — N179 Acute kidney failure, unspecified: Secondary | ICD-10-CM | POA: Diagnosis not present

## 2023-02-21 DIAGNOSIS — E669 Obesity, unspecified: Secondary | ICD-10-CM

## 2023-02-21 DIAGNOSIS — I4891 Unspecified atrial fibrillation: Secondary | ICD-10-CM | POA: Diagnosis not present

## 2023-02-21 DIAGNOSIS — S065XAA Traumatic subdural hemorrhage with loss of consciousness status unknown, initial encounter: Secondary | ICD-10-CM

## 2023-02-21 DIAGNOSIS — J9601 Acute respiratory failure with hypoxia: Secondary | ICD-10-CM | POA: Diagnosis not present

## 2023-02-21 DIAGNOSIS — Z515 Encounter for palliative care: Secondary | ICD-10-CM

## 2023-02-21 DIAGNOSIS — G9341 Metabolic encephalopathy: Secondary | ICD-10-CM

## 2023-02-21 MED ORDER — GLYCOPYRROLATE 1 MG PO TABS: 2.0000 mg | ORAL_TABLET | ORAL | Status: DC | PRN

## 2023-02-21 MED ORDER — GLYCOPYRROLATE 0.2 MG/ML IJ SOLN
0.2000 mg | Freq: Once | INTRAMUSCULAR | Status: AC
  Administered 2023-02-21: 0.2 mg via INTRAVENOUS
  Filled 2023-02-21: qty 1

## 2023-02-21 MED ORDER — GLYCOPYRROLATE 0.2 MG/ML IJ SOLN
0.3000 mg | INTRAMUSCULAR | Status: DC | PRN
  Administered 2023-02-21 – 2023-02-23 (×7): 0.3 mg via INTRAVENOUS
  Filled 2023-02-21: qty 1.5
  Filled 2023-02-21: qty 2
  Filled 2023-02-21: qty 1.5
  Filled 2023-02-21 (×2): qty 2
  Filled 2023-02-21 (×2): qty 1.5
  Filled 2023-02-21: qty 2

## 2023-02-21 MED ORDER — GLYCOPYRROLATE 0.2 MG/ML IJ SOLN: 0.4000 mg | INTRAMUSCULAR | Status: DC | PRN

## 2023-02-21 NOTE — Assessment & Plan Note (Signed)
On admission. °

## 2023-02-21 NOTE — Assessment & Plan Note (Signed)
Secondary to subdural hematoma

## 2023-02-21 NOTE — Assessment & Plan Note (Addendum)
No further treatment 

## 2023-02-21 NOTE — Progress Notes (Signed)
Progress Note   Patient: [name] ZOX:[id number] DOB: 1945-12-27 DOA: 02/07/2023     13 DOS: the patient was seen and examined on 02/21/2023   Brief hospital course: 77 year old female with a history of atrial fibrillation with rapid ventricular response, hypothyroidism, epilepsy, anxiety and depression, history of recurrent falls on multiple centrally acting medications came in with findings of atrial fibrillation rapid ventricular response was initially seen by cardiology medically treated placed on blood thinners with Eliquis underwent transesophageal echo with cardioversion. She is being treated for diastolic CHF as well has UTI. She does have a history of chronic diarrhea which was also managed on the floors. Initially had some improvement symptomatically was being diuresed on the floor then suddenly developed headaches and was treated with narcotics for this. She had CT head performed which showed subdural hematoma and patient was brought in to medical intensive care unit lethargic unable to protect airway with active intracranial bleeding. Multiple consultants were contacted including neurosurgery and plan is made to take patient to the operating room on 6/14 for subdural evacuation.  Patient converted to end-of-life care on 6/17 and was extubated and comfort care measures ordered.  6/19.  Patient was able to wiggle toes to command.  Opens eyes to sternal rub.  Had periods of apnea.  Asked nursing staff to give morphine a little more often.  Glycopyrrolate dose increased by palliative care.    Assessment and Plan: * End of life care As needed morphine pushes.  Glycopyrrolate dose increased by palliative care.  Patient with periods of apnea.  Subdural hematoma (HCC) Status post left frontotemporoparietal craniotomy for evacuation of hematoma on 6/14.  Acute respiratory failure with hypoxia [facility]) The patient was on the ventilator and on the critical care service.  Terminally extubated on  6/17.  Atrial fibrillation with rapid ventricular response (HCC) Heart rate over 100 this morning.  AKI (acute kidney injury) (HCC) On admission.  Acute metabolic encephalopathy Secondary to subdural hematoma  Obesity (BMI 30.0-34.9) BMI 36.80        Subjective: Patient opened eyes wide with sternal rub.  Able to wiggle toes to command.  Physical Exam: Vitals:   02/19/23 1900 02/19/23 2000 02/20/23 0719 02/21/23 0004  BP:   112/74 (!) 119/91  Pulse: 93 86  68  Resp: (!) 8 (!) 8 16 (!) 6  Temp:   98.3 F (36.8 C) 98.9 F (37.2 C)  TempSrc:      SpO2: (!) 80% (!) 80% 93% 92%  Weight:      Height:       Physical Exam HENT:     Head: Normocephalic.  Eyes:     General: Lids are normal.     Comments: Right pupil fixed.  Left pupil reactive  Cardiovascular:     Rate and Rhythm: Tachycardia present. Rhythm irregularly irregular.     Heart sounds: Normal heart sounds, S1 normal and S2 normal.  Pulmonary:     Breath sounds: Examination of the right-lower field reveals decreased breath sounds and rhonchi. Examination of the left-lower field reveals decreased breath sounds and rhonchi. Decreased breath sounds and rhonchi present. No wheezing or rales.  Abdominal:     Palpations: Abdomen is soft.     Tenderness: There is no abdominal tenderness.  Musculoskeletal:     Right lower leg: No swelling.     Left lower leg: No swelling.  Skin:    General: Skin is warm.     Findings: No rash.  Neurological:  Mental Status: She is lethargic.     Comments: Wiggle toes to command.  Open eyes with sternal rub.     Data Reviewed: No new data  Family Communication: Son at bedside  Disposition: Status is: Inpatient Remains inpatient appropriate because: End-of-life care here in the hospital  Planned Discharge Destination: End-of-life care here in the hospital    Time spent: 28 minutes  Author: Alford Highland, MD 02/21/2023 12:11 PM  For on call review  [email or web address].

## 2023-02-21 NOTE — Assessment & Plan Note (Addendum)
Patient expired on 6/22 at 6:53 AM she was on comfort care measures including morphine drip.

## 2023-02-21 NOTE — Assessment & Plan Note (Signed)
BMI 36.80

## 2023-02-21 NOTE — Plan of Care (Signed)
  Problem: Education: Goal: Knowledge of General Education information will improve Description: Including pain rating scale, medication(s)/side effects and non-pharmacologic comfort measures Outcome: Not Progressing   Problem: Health Behavior/Discharge Planning: Goal: Ability to manage health-related needs will improve Outcome: Not Progressing   Problem: Clinical Measurements: Goal: Ability to maintain clinical measurements within normal limits will improve Outcome: Not Progressing Goal: Will remain free from infection Outcome: Not Progressing Goal: Diagnostic test results will improve Outcome: Not Progressing Goal: Respiratory complications will improve Outcome: Not Progressing Goal: Cardiovascular complication will be avoided Outcome: Not Progressing   Problem: Activity: Goal: Risk for activity intolerance will decrease Outcome: Not Progressing   Problem: Nutrition: Goal: Adequate nutrition will be maintained Outcome: Not Progressing   Problem: Coping: Goal: Level of anxiety will decrease Outcome: Not Progressing   Problem: Elimination: Goal: Will not experience complications related to bowel motility Outcome: Not Progressing Goal: Will not experience complications related to urinary retention Outcome: Not Progressing   Problem: Pain Managment: Goal: General experience of comfort will improve Outcome: Not Progressing   Problem: Safety: Goal: Ability to remain free from injury will improve Outcome: Not Progressing   Problem: Skin Integrity: Goal: Risk for impaired skin integrity will decrease Outcome: Not Progressing   Problem: Activity: Goal: Ability to tolerate increased activity will improve Outcome: Not Progressing   Problem: Respiratory: Goal: Ability to maintain a clear airway and adequate ventilation will improve Outcome: Not Progressing   Problem: Role Relationship: Goal: Method of communication will improve Outcome: Not Progressing   Problem:  Education: Goal: Ability to describe self-care measures that may prevent or decrease complications (Diabetes Survival Skills Education) will improve Outcome: Not Progressing Goal: Individualized Educational Video(s) Outcome: Not Progressing   Problem: Coping: Goal: Ability to adjust to condition or change in health will improve Outcome: Not Progressing   Problem: Fluid Volume: Goal: Ability to maintain a balanced intake and output will improve Outcome: Not Progressing   Problem: Health Behavior/Discharge Planning: Goal: Ability to identify and utilize available resources and services will improve Outcome: Not Progressing Goal: Ability to manage health-related needs will improve Outcome: Not Progressing   Problem: Metabolic: Goal: Ability to maintain appropriate glucose levels will improve Outcome: Not Progressing   Problem: Nutritional: Goal: Maintenance of adequate nutrition will improve Outcome: Not Progressing Goal: Progress toward achieving an optimal weight will improve Outcome: Not Progressing   Problem: Skin Integrity: Goal: Risk for impaired skin integrity will decrease Outcome: Not Progressing   Problem: Tissue Perfusion: Goal: Adequacy of tissue perfusion will improve Outcome: Not Progressing   

## 2023-02-21 NOTE — Assessment & Plan Note (Signed)
The patient was on the ventilator and on the critical care service.  Terminally extubated on 6/17.

## 2023-02-21 NOTE — Progress Notes (Signed)
Nutrition Brief Note  Chart reviewed. Pt now transitioning to comfort care.  No further nutrition interventions planned at this time.  Please re-consult as needed.   Allyana Vogan W, RD, LDN, CDCES Registered Dietitian II Certified Diabetes Care and Education Specialist Please refer to AMION for RD and/or RD on-call/weekend/after hours pager   

## 2023-02-21 NOTE — TOC Progression Note (Signed)
Transition of Care [facility]) - Progression Note    Patient Details  Name: [name] MRN: [id number] Date of Birth: Mar 22, 1946  [facility]) CM/SW Contact  Garret Reddish, RN Phone Number: 02/21/2023, 9:18 AM  Clinical Narrative:  Chart reviewed.  Noted that patient was admitted with Subdural hematoma.  Patient had compassionate extubation on yesterday.  Patient is now comfort care.  Patient was a resident of [facility] facility. Palliative Care following.  Noted expected hospital death.    TOC will continue to follow for additional needs.        Expected Discharge Plan: Skilled Nursing Facility Barriers to Discharge: Continued Medical Work up  Expected Discharge Plan and Services     Post Acute Care Choice: Skilled Nursing Facility Living arrangements for the past 2 months: Assisted Living Facility                                       Social Determinants of Health (SDOH) Interventions SDOH Screenings   Food Insecurity: No Food Insecurity (02/09/2023)  Housing: Low Risk  (02/09/2023)  Transportation Needs: No Transportation Needs (02/09/2023)  Utilities: Not At Risk (02/09/2023)  Depression (PHQ2-9): High Risk (03/31/2022)  Tobacco Use: Low Risk  (02/20/2023)    Readmission Risk Interventions     No data to display

## 2023-02-21 NOTE — Progress Notes (Addendum)
Daily Progress Note   Patient Name: [name]       Date: 02/21/2023 DOB: [birth year]  Age: 77 y.o. MRN#: [id number] Attending Physician: Alford Highland, MD Primary Care Physician: Smiley Houseman, NP Admit Date: 02/07/2023  Reason for Consultation/Follow-up: Establishing goals of care and Terminal Care  Subjective: Notes reviewed. Patient was made comfort care by CCM and is currently resting in bed with son at bedside.  She opens eyes somewhat a couple of times during my visit but is unable to speak.  She appears comfortable.  Breathing is regular with long pauses noted. Congested/noisy breathing noted. Robinul has been given, and Robinul dosing increased.  Extremities are warm to the touch.  Patient does not appear stable to discharge from hospital.  Son's questions were answered regarding end-of-life and expectations.   Length of Stay: 13  Current Medications: Scheduled Meds:   glycopyrrolate  0.2 mg Intravenous Once    Continuous Infusions:   PRN Meds: glycopyrrolate **OR** [DISCONTINUED] glycopyrrolate **OR** glycopyrrolate, hydrocortisone cream, midazolam, morphine injection, polyvinyl alcohol  Physical Exam Constitutional:      Comments: Opens eyes somewhat periodically             Vital Signs: BP (!) 119/91 (BP Location: Right Wrist)   Pulse 68   Temp 98.9 F (37.2 C)   Resp (!) 6 Comment: RN Meisha aware of pt vs  Ht 5\' 5"  (1.651 m)   Wt 100.3 kg   SpO2 92%   BMI 36.80 kg/m  SpO2: SpO2: 92 % O2 Device: O2 Device: Room Air O2 Flow Rate: O2 Flow Rate (L/min): 2 L/min  Intake/output summary:  Intake/Output Summary (Last 24 hours) at 02/21/2023 1045 Last data filed at 02/21/2023 0007 Gross per 24 hour  Intake --  Output 500 ml  Net -500 ml   LBM: Last BM  Date : 02/15/23 Baseline Weight: Weight: 102 kg Most recent weight: Weight: 100.3 kg   Patient Active Problem List   Diagnosis Date Noted   Nonintractable headache 02/16/2023   Other headache syndrome 02/16/2023   Shortness of breath 02/13/2023   Fall 02/08/2023   Atrial fibrillation with RVR (HCC) 02/08/2023   Acute cystitis without hematuria 02/07/2023   Paroxysmal atrial fibrillation (HCC) 02/07/2023   AKI (acute kidney injury) (HCC) 02/07/2023   Hypokalemia 02/07/2023  Falls 11/20/2022   Gastroesophageal reflux disease 09/24/2022   Dysuria 10/24/2021   Morbid (severe) obesity due to excess calories (HCC) 10/04/2021   Chronic migraine w/o aura w/o status migrainosus, not intractable 05/19/2021   Dementia with behavioral disturbance (HCC) 05/19/2021   Folliculitis 12/27/2020   Rash 12/27/2020   Abnormal weight loss 10/22/2020   Morbid obesity (HCC) 10/22/2020   Nausea 10/22/2020   Long-term current use of opiate analgesic 12/01/2019   Encounter for Medicare annual wellness exam 12/25/2018   Chronic back pain 09/05/2018   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   History of gastrointestinal hemorrhage 09/05/2018   Insomnia 09/05/2018   Cognitive impairment 05/07/2018   Age related osteoporosis 03/12/2018   On long term drug therapy 09/07/2017   Left-sided chest wall pain 07/25/2017   Hypotension 01/22/2017   Cerebrovascular accident (CVA) due to thrombosis of precerebral artery (HCC) 12/05/2016   Neck pain 12/05/2016   Gait instability 12/05/2016   Paronychia of finger of right hand 08/22/2016   Osteoarthritis of right knee 11/22/2015   Right knee pain 10/14/2015   Actinic keratoses 07/02/2015   Chest wall tenderness 04/27/2014   Foot drop, left 04/14/2014   Traumatic injury of lower extremity 03/25/2014   Obesity (BMI 30.0-34.9) 01/30/2014   Healthcare maintenance 01/30/2014   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   Diastolic CHF (HCC) 02/27/2013    Fibromyalgia 08/14/2012   Numbness in feet 08/08/2012   GERD with stricture 03/04/2012   Leg edema 03/04/2012   CATARACT, LEFT EYE 08/23/2010   Abnormality of gait 08/23/2010   Osteoarthritis 02/24/2010   Iron deficiency anemia 03/06/2009   DEPENDENCE, OPIOID, 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 Robinul dosing increased from 0.2 to 0.3 mg for excessive secretions.  Code Status:    Code Status Orders  (From admission, onward)           Start     Ordered   02/19/23 1504  Do not attempt resuscitation (DNR)  Continuous       Question Answer Comment  If patient has no pulse and is not breathing Do Not Attempt Resuscitation   If patient has a pulse and/or is breathing: Medical Treatment Goals COMFORT MEASURES: Keep clean/warm/dry, use medication by any route; positioning, wound care and other measures to relieve pain/suffering; use oxygen, suction/manual treatment of airway obstruction for comfort; do not transfer unless for comfort needs.   Consent: Discussion documented in EHR or advanced directives reviewed      02/19/23 1504           Code Status History     Date Active Date Inactive Code Status Order ID Comments User Context   02/17/2023 1612 02/19/2023 1504 DNR [id number]  Theotis Burrow, NP Inpatient   02/08/2023 0040 02/17/2023 1612 Full Code [id number]  Gertha Calkin, MD ED   05/16/2013 1411 05/20/2013 2108 Full Code [id number]  Verlee Rossetti, MD Inpatient      Advance Directive Documentation    Flowsheet Row Most Recent Value  Type of Advance Directive Living will   Pre-existing out of facility DNR order (yellow form or pink MOST form) --  "  MOST" Form in Place? --       Prognosis:  Hours - Days    Care plan was discussed with nursing  Thank you for allowing the Palliative Medicine Team to assist in the care of this patient.    Morton Stall, NP  Please contact Palliative Medicine Team phone at [phone] for questions and concerns.

## 2023-02-21 NOTE — Assessment & Plan Note (Signed)
Status post left frontotemporoparietal craniotomy for evacuation of hematoma on 6/14.

## 2023-02-22 DIAGNOSIS — E669 Obesity, unspecified: Secondary | ICD-10-CM | POA: Diagnosis not present

## 2023-02-22 DIAGNOSIS — I4891 Unspecified atrial fibrillation: Secondary | ICD-10-CM | POA: Diagnosis not present

## 2023-02-22 DIAGNOSIS — G9341 Metabolic encephalopathy: Secondary | ICD-10-CM | POA: Diagnosis not present

## 2023-02-22 DIAGNOSIS — J9601 Acute respiratory failure with hypoxia: Secondary | ICD-10-CM | POA: Diagnosis not present

## 2023-02-22 DIAGNOSIS — S065XAA Traumatic subdural hemorrhage with loss of consciousness status unknown, initial encounter: Secondary | ICD-10-CM | POA: Diagnosis not present

## 2023-02-22 DIAGNOSIS — N179 Acute kidney failure, unspecified: Secondary | ICD-10-CM | POA: Diagnosis not present

## 2023-02-22 DIAGNOSIS — Z7189 Other specified counseling: Secondary | ICD-10-CM | POA: Diagnosis not present

## 2023-02-22 DIAGNOSIS — Z515 Encounter for palliative care: Secondary | ICD-10-CM | POA: Diagnosis not present

## 2023-02-22 MED ORDER — LORAZEPAM 2 MG/ML IJ SOLN
1.0000 mg | INTRAMUSCULAR | Status: DC | PRN
  Administered 2023-02-23: 1 mg via INTRAVENOUS
  Filled 2023-02-22: qty 1

## 2023-02-22 MED ORDER — MORPHINE SULFATE (PF) 4 MG/ML IV SOLN: 4.0000 mg | INTRAVENOUS | Status: DC

## 2023-02-22 MED ORDER — MORPHINE 100MG IN NS 100ML (1MG/ML) PREMIX INFUSION
3.0000 mg/h | INTRAVENOUS | Status: DC
  Administered 2023-02-22 – 2023-02-23 (×3): 4.5 mg/h via INTRAVENOUS
  Filled 2023-02-22 (×3): qty 100

## 2023-02-22 MED ORDER — MORPHINE BOLUS VIA INFUSION
2.0000 mg | INTRAVENOUS | Status: DC | PRN
  Administered 2023-02-23 (×3): 2 mg via INTRAVENOUS
  Administered 2023-02-23: 4 mg via INTRAVENOUS
  Administered 2023-02-23: 2 mg via INTRAVENOUS

## 2023-02-22 MED ORDER — HALOPERIDOL LACTATE 5 MG/ML IJ SOLN: 5.0000 mg | Freq: Four times a day (QID) | INTRAMUSCULAR | Status: DC | PRN

## 2023-02-22 NOTE — Progress Notes (Signed)
Resting in bed with family at bedside. Morphine drip going at 4.5 ml/hr. Chest expansion symmetrical. No labored breathing noted at this time. Comfort care measures maintained. Foley intact.

## 2023-02-22 NOTE — Progress Notes (Signed)
   02/22/23 1000  Spiritual Encounters  Type of Visit Initial  Care provided to: Patient  Referral source Chaplain assessment  Reason for visit Routine spiritual support  OnCall Visit No  Spiritual Framework  Patient Stress Factors None identified  Family Stress Factors None identified  Interventions  Spiritual Care Interventions Made Established relationship of care and support;Compassionate presence;Reflective listening  Spiritual Care Plan  Spiritual Care Issues Still Outstanding No further spiritual care needs at this time (see row info)   Spoke with patient checking on her while during routine rounding. Patient is dealing with some cognitive thinking. But she still was able to express her concerns of having someone being able to care for her.

## 2023-02-22 NOTE — Progress Notes (Signed)
Progress Note   Patient: [name] UEA:[id number] DOB: 1946-03-01 DOA: 02/07/2023     14 DOS: the patient was seen and examined on 02/22/2023   Brief hospital course: 77 year old female with a history of atrial fibrillation with rapid ventricular response, hypothyroidism, epilepsy, anxiety and depression, history of recurrent falls on multiple centrally acting medications came in with findings of atrial fibrillation rapid ventricular response was initially seen by cardiology medically treated placed on blood thinners with Eliquis underwent transesophageal echo with cardioversion. She is being treated for diastolic CHF as well has UTI. She does have a history of chronic diarrhea which was also managed on the floors. Initially had some improvement symptomatically was being diuresed on the floor then suddenly developed headaches and was treated with narcotics for this. She had CT head performed which showed subdural hematoma and patient was brought in to medical intensive care unit lethargic unable to protect airway with active intracranial bleeding. Multiple consultants were contacted including neurosurgery and plan is made to take patient to the operating room on 6/14 for subdural evacuation.  Patient converted to end-of-life care on 6/17 and was extubated and comfort care measures ordered.  6/19.  Patient was able to wiggle toes to command.  Opens eyes to sternal rub.  Had periods of apnea.  Asked nursing staff to give morphine a little more often.  Glycopyrrolate dose increased by palliative care. 6/20.  Son noticed periods where she was arching her neck with breathing.  Started morphine drip this morning.    Assessment and Plan: * End of life care Started low-dose morphine drip this morning.  As needed morphine pushes.  Glycopyrrolate dose increased by palliative care yesterday.  Patient with periods of apnea.  Subdural hematoma (HCC) Status post left frontotemporoparietal craniotomy for  evacuation of hematoma on 6/14.  Acute respiratory failure with hypoxia [facility]) The patient was on the ventilator and on the critical care service.  Terminally extubated on 6/17.  Atrial fibrillation with rapid ventricular response (HCC) No further treatment  AKI (acute kidney injury) (HCC) On admission.  Acute metabolic encephalopathy Secondary to subdural hematoma  Obesity (BMI 30.0-34.9) BMI 36.80        Subjective: Patient opened eyes when stimulated.  Unable to follow commands.  Currently on comfort care measures.  Physical Exam: Vitals:   02/20/23 0719 02/21/23 0004 02/22/23 0004 02/22/23 1446  BP: 112/74 (!) 119/91 (!) 144/116 126/87  Pulse:  68 (!) 170 83  Resp: 16 (!) 6 14 (!) 22  Temp: 98.3 F (36.8 C) 98.9 F (37.2 C) 98 F (36.7 C) 100.1 F (37.8 C)  TempSrc:    Axillary  SpO2: 93% 92% 93% 92%  Weight:      Height:       Physical Exam HENT:     Head: Normocephalic.  Eyes:     General: Lids are normal.     Comments: Right pupil fixed.  Left pupil reactive  Cardiovascular:     Rate and Rhythm: Tachycardia present. Rhythm irregularly irregular.     Heart sounds: Normal heart sounds, S1 normal and S2 normal.  Pulmonary:     Breath sounds: Examination of the right-lower field reveals decreased breath sounds and rhonchi. Examination of the left-lower field reveals decreased breath sounds and rhonchi. Decreased breath sounds and rhonchi present. No wheezing or rales.  Abdominal:     Palpations: Abdomen is soft.     Tenderness: There is no abdominal tenderness.  Musculoskeletal:     Right lower  leg: No swelling.     Left lower leg: No swelling.  Skin:    General: Skin is warm.     Findings: No rash.  Neurological:     Mental Status: She is lethargic.     Comments: Opens eyes with stimulation     Data Reviewed: No new data  Family Communication: Spoke with son at the bedside this morning  Disposition: Status is: Inpatient Remains inpatient  appropriate because: Started morphine drip for end-of-life care this morning.  Planned Discharge Destination: End-of-life care here in the hospital    Time spent: 27 minutes Case discussed with palliative care and nursing staff.  Author: Alford Highland, MD 02/22/2023 2:49 PM  For on call review [email or web address].

## 2023-02-22 NOTE — Plan of Care (Signed)
Problem: Education: Goal: Knowledge of General Education information will improve Description: Including pain rating scale, medication(s)/side effects and non-pharmacologic comfort measures 02/22/2023 0752 by Latanya Maudlin, RN Outcome: Not Progressing 02/22/2023 0752 by Latanya Maudlin, RN Outcome: Not Progressing   Problem: Health Behavior/Discharge Planning: Goal: Ability to manage health-related needs will improve 02/22/2023 0752 by Latanya Maudlin, RN Outcome: Not Progressing 02/22/2023 0752 by Latanya Maudlin, RN Outcome: Not Progressing   Problem: Clinical Measurements: Goal: Ability to maintain clinical measurements within normal limits will improve 02/22/2023 0752 by Latanya Maudlin, RN Outcome: Not Progressing 02/22/2023 0752 by Latanya Maudlin, RN Outcome: Not Progressing Goal: Will remain free from infection 02/22/2023 0752 by Latanya Maudlin, RN Outcome: Not Progressing 02/22/2023 0752 by Latanya Maudlin, RN Outcome: Not Progressing Goal: Diagnostic test results will improve 02/22/2023 0752 by Latanya Maudlin, RN Outcome: Not Progressing 02/22/2023 0752 by Latanya Maudlin, RN Outcome: Not Progressing Goal: Respiratory complications will improve 02/22/2023 0752 by Latanya Maudlin, RN Outcome: Not Progressing 02/22/2023 0752 by Latanya Maudlin, RN Outcome: Not Progressing Goal: Cardiovascular complication will be avoided 02/22/2023 0752 by Latanya Maudlin, RN Outcome: Not Progressing 02/22/2023 0752 by Latanya Maudlin, RN Outcome: Not Progressing   Problem: Activity: Goal: Risk for activity intolerance will decrease 02/22/2023 0752 by Latanya Maudlin, RN Outcome: Not Progressing 02/22/2023 0752 by Latanya Maudlin, RN Outcome: Not Progressing   Problem: Nutrition: Goal: Adequate nutrition will be maintained 02/22/2023 0752 by Latanya Maudlin, RN Outcome: Not Progressing 02/22/2023 0752 by Latanya Maudlin, RN Outcome: Not Progressing   Problem:  Coping: Goal: Level of anxiety will decrease 02/22/2023 0752 by Latanya Maudlin, RN Outcome: Not Progressing 02/22/2023 0752 by Latanya Maudlin, RN Outcome: Not Progressing   Problem: Elimination: Goal: Will not experience complications related to bowel motility 02/22/2023 0752 by Latanya Maudlin, RN Outcome: Not Progressing 02/22/2023 0752 by Latanya Maudlin, RN Outcome: Not Progressing Goal: Will not experience complications related to urinary retention 02/22/2023 0752 by Latanya Maudlin, RN Outcome: Not Progressing 02/22/2023 0752 by Latanya Maudlin, RN Outcome: Not Progressing   Problem: Pain Managment: Goal: General experience of comfort will improve 02/22/2023 0752 by Latanya Maudlin, RN Outcome: Not Progressing 02/22/2023 0752 by Latanya Maudlin, RN Outcome: Not Progressing   Problem: Safety: Goal: Ability to remain free from injury will improve 02/22/2023 0752 by Latanya Maudlin, RN Outcome: Not Progressing 02/22/2023 0752 by Latanya Maudlin, RN Outcome: Not Progressing   Problem: Skin Integrity: Goal: Risk for impaired skin integrity will decrease 02/22/2023 0752 by Latanya Maudlin, RN Outcome: Not Progressing 02/22/2023 0752 by Latanya Maudlin, RN Outcome: Not Progressing   Problem: Activity: Goal: Ability to tolerate increased activity will improve 02/22/2023 0752 by Latanya Maudlin, RN Outcome: Not Progressing 02/22/2023 0752 by Latanya Maudlin, RN Outcome: Not Progressing   Problem: Respiratory: Goal: Ability to maintain a clear airway and adequate ventilation will improve 02/22/2023 0752 by Latanya Maudlin, RN Outcome: Not Progressing 02/22/2023 0752 by Latanya Maudlin, RN Outcome: Not Progressing   Problem: Role Relationship: Goal: Method of communication will improve 02/22/2023 0752 by Latanya Maudlin, RN Outcome: Not Progressing 02/22/2023 0752 by Latanya Maudlin, RN Outcome: Not Progressing   Problem: Education: Goal: Ability to describe  self-care measures that may prevent or decrease complications (Diabetes Survival Skills Education) will improve 02/22/2023 0752 by Latanya Maudlin, RN Outcome: Not Progressing 02/22/2023 [phone]  by Latanya Maudlin, RN Outcome: Not Progressing Goal: Individualized Educational Video(s) 02/22/2023 0752 by Latanya Maudlin, RN Outcome: Not Progressing 02/22/2023 0752 by Latanya Maudlin, RN Outcome: Not Progressing   Problem: Coping: Goal: Ability to adjust to condition or change in health will improve 02/22/2023 0752 by Latanya Maudlin, RN Outcome: Not Progressing 02/22/2023 0752 by Latanya Maudlin, RN Outcome: Not Progressing   Problem: Fluid Volume: Goal: Ability to maintain a balanced intake and output will improve 02/22/2023 0752 by Latanya Maudlin, RN Outcome: Not Progressing 02/22/2023 0752 by Latanya Maudlin, RN Outcome: Not Progressing   Problem: Health Behavior/Discharge Planning: Goal: Ability to identify and utilize available resources and services will improve 02/22/2023 0752 by Latanya Maudlin, RN Outcome: Not Progressing 02/22/2023 0752 by Latanya Maudlin, RN Outcome: Not Progressing Goal: Ability to manage health-related needs will improve 02/22/2023 0752 by Latanya Maudlin, RN Outcome: Not Progressing 02/22/2023 0752 by Latanya Maudlin, RN Outcome: Not Progressing   Problem: Metabolic: Goal: Ability to maintain appropriate glucose levels will improve 02/22/2023 0752 by Latanya Maudlin, RN Outcome: Not Progressing 02/22/2023 0752 by Latanya Maudlin, RN Outcome: Not Progressing   Problem: Nutritional: Goal: Maintenance of adequate nutrition will improve 02/22/2023 0752 by Latanya Maudlin, RN Outcome: Not Progressing 02/22/2023 0752 by Latanya Maudlin, RN Outcome: Not Progressing Goal: Progress toward achieving an optimal weight will improve 02/22/2023 0752 by Latanya Maudlin, RN Outcome: Not Progressing 02/22/2023 0752 by Latanya Maudlin, RN Outcome: Not  Progressing   Problem: Skin Integrity: Goal: Risk for impaired skin integrity will decrease 02/22/2023 0752 by Latanya Maudlin, RN Outcome: Not Progressing 02/22/2023 0752 by Latanya Maudlin, RN Outcome: Not Progressing   Problem: Tissue Perfusion: Goal: Adequacy of tissue perfusion will improve 02/22/2023 0752 by Latanya Maudlin, RN Outcome: Not Progressing 02/22/2023 0752 by Latanya Maudlin, RN Outcome: Not Progressing

## 2023-02-22 NOTE — Progress Notes (Addendum)
Daily Progress Note   Patient Name: [name]       Date: 02/22/2023 DOB: 1946-06-29  Age: 77 y.o. MRN#: [id number] Attending Physician: Alford Highland, MD Primary Care Physician: Smiley Houseman, NP Admit Date: 02/07/2023  Reason for Consultation/Follow-up: Establishing goals of care and Terminal Care  Subjective: Notes reviewed.  In to see patient.  Son is at bedside.  Patient currently with tachypnea and accessory muscle use.  She appears uncomfortable.  Son states morphine was just provided to her earlier.  She is not responsive.  Questions answered and discussed the dying process.  Discussed concerns or fears she may have.  He states that his father has been able to arrange transportation to come and spend time with patient, and 2 of their pastor's are coming to spend time with them and pray for them.  Stepped out and spoke with nursing and attending regarding updating orders for symptom management.    In to bedside with nursing, and in-depth education provided on orders placed, and pump use.  Questions answered.  Morphine boluses administered for comfort and infusion increased based on bolus dosing.  Patient was no longer tachypneic and able to rest comfortably following these changes.  Son entered with his father and pastors were present as well.  Spent time with patient's husband and son to answer any questions they may have.  Patient was able to weakly open her eyes to look at her husband.  Son stepped out and voiced gratitude for his mother's care, and states he hopes that these visits will bring comfort to her.  I completed a MOST form today with his and the signed original was placed in the chart. The form was scanned and sent to medical records for it to be uploaded under ACP tab  in Epic. A photocopy was also placed in the chart to be scanned into EMR. The patient outlined their wishes for the following treatment decisions:  Cardiopulmonary Resuscitation: Do Not Attempt Resuscitation (DNR/No CPR)  Medical Interventions: Comfort Measures: Keep clean, warm, and dry. Use medication by any route, positioning, wound care, and other measures to relieve pain and suffering. Use oxygen, suction and manual treatment of airway obstruction as needed for comfort. Do not transfer to the hospital unless comfort needs cannot be met in current location.  Antibiotics: No  antibiotics (use other measures to relieve symptoms)  IV Fluids: No IV fluids (provide other measures to ensure comfort)  Feeding Tube: No feeding tube    ADDENDUM: Returned to check on patient when she had been moved to 1C.  Patient currently resting in bed with no distress noted.  No family at bedside.  Length of Stay: 14  Current Medications: Scheduled Meds:    Continuous Infusions:  morphine      PRN Meds: glycopyrrolate **OR** [DISCONTINUED] glycopyrrolate **OR** glycopyrrolate, haloperidol lactate, hydrocortisone cream, LORazepam, morphine, polyvinyl alcohol  Physical Exam Constitutional:      Comments: Open eyes to look at husband  Pulmonary:     Comments: Initially tachypneic            Vital Signs: BP (!) 144/116 (BP Location: Right Arm)   Pulse (!) 170   Temp 98 F (36.7 C)   Resp 14   Ht 5\' 5"  (1.651 m)   Wt 100.3 kg   SpO2 93%   BMI 36.80 kg/m  SpO2: SpO2: 93 % O2 Device: O2 Device: Room Air O2 Flow Rate: O2 Flow Rate (L/min): 2 L/min  Intake/output summary:  Intake/Output Summary (Last 24 hours) at 02/22/2023 1142 Last data filed at 02/22/2023 7829 Gross per 24 hour  Intake --  Output 1000 ml  Net -1000 ml   LBM: Last BM Date : 02/15/23 Baseline Weight: Weight: 102 kg Most recent weight: Weight: 100.3 kg    Patient Active Problem List   Diagnosis Date Noted   Subdural  hematoma (HCC) 02/21/2023   Acute respiratory failure with hypoxia (HCC) 02/21/2023   Acute metabolic encephalopathy 02/21/2023   Nonintractable headache 02/16/2023   Other headache syndrome 02/16/2023   Shortness of breath 02/13/2023   Fall 02/08/2023   Atrial fibrillation with rapid ventricular response (HCC) 02/08/2023   Acute cystitis without hematuria 02/07/2023   AKI (acute kidney injury) (HCC) 02/07/2023   Hypokalemia 02/07/2023   Falls 11/20/2022   Gastroesophageal reflux disease 09/24/2022   Dysuria 10/24/2021   Morbid (severe) obesity due to excess calories (HCC) 10/04/2021   Chronic migraine w/o aura w/o status migrainosus, not intractable 05/19/2021   Dementia with behavioral disturbance (HCC) 05/19/2021   Folliculitis 12/27/2020   Rash 12/27/2020   Abnormal weight loss 10/22/2020   Morbid obesity (HCC) 10/22/2020   Nausea 10/22/2020   Long-term current use of opiate analgesic 12/01/2019   End of life care 12/25/2018   Chronic back pain 09/05/2018   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   History of gastrointestinal hemorrhage 09/05/2018   Insomnia 09/05/2018   Cognitive impairment 05/07/2018   Age related osteoporosis 03/12/2018   On long term drug therapy 09/07/2017   Left-sided chest wall pain 07/25/2017   Hypotension 01/22/2017   Cerebrovascular accident (CVA) due to thrombosis of precerebral artery (HCC) 12/05/2016   Neck pain 12/05/2016   Gait instability 12/05/2016   Paronychia of finger of right hand 08/22/2016   Osteoarthritis of right knee 11/22/2015   Right knee pain 10/14/2015   Actinic keratoses 07/02/2015   Chest wall tenderness 04/27/2014   Foot drop, left 04/14/2014   Traumatic injury of lower extremity 03/25/2014   Obesity (BMI 30.0-34.9) 01/30/2014   Healthcare maintenance 01/30/2014   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   Diastolic CHF (HCC) 02/27/2013   Fibromyalgia 08/14/2012   Numbness in feet 08/08/2012   GERD with  stricture 03/04/2012   Leg edema 03/04/2012   CATARACT, LEFT EYE 08/23/2010   Abnormality of gait 08/23/2010  Osteoarthritis 02/24/2010   Iron deficiency anemia 03/06/2009   DEPENDENCE, OPIOID, 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  Morphine infusion with boluses in place.  Administration instructions for each that if 2 boluses are given within an hour to increase the infusion by 50%. Bed requested on 1C end-of-life cohort unit  Code Status:    Code Status Orders  (From admission, onward)           Start     Ordered   02/19/23 1504  Do not attempt resuscitation (DNR)  Continuous       Question Answer Comment  If patient has no pulse and is not breathing Do Not Attempt Resuscitation   If patient has a pulse and/or is breathing: Medical Treatment Goals COMFORT MEASURES: Keep clean/warm/dry, use medication by any route; positioning, wound care and other measures to relieve pain/suffering; use oxygen, suction/manual treatment of airway obstruction for comfort; do not transfer unless for comfort needs.   Consent: Discussion documented in EHR or advanced directives reviewed      02/19/23 1504           Code Status History     Date Active Date Inactive Code Status Order ID Comments User Context   02/17/2023 1612 02/19/2023 1504 DNR [id number]  Theotis Burrow, NP Inpatient   02/08/2023 0040 02/17/2023 1612 Full Code [id number]  Gertha Calkin, MD ED   05/16/2013 1411 05/20/2013 2108 Full Code [id number]  Verlee Rossetti, MD Inpatient      Advance  Directive Documentation    Flowsheet Row Most Recent Value  Type of Advance Directive Living will  Pre-existing out of facility DNR order (yellow form or pink MOST form) --  "MOST" Form in Place? --       Prognosis:  Hours - Days    Care plan was discussed with attending  Thank you for allowing the Palliative Medicine Team to assist in the care of this patient.   Morton Stall, NP  Please contact Palliative Medicine Team phone at [phone] for questions and concerns.

## 2023-02-23 DIAGNOSIS — I4891 Unspecified atrial fibrillation: Secondary | ICD-10-CM | POA: Diagnosis not present

## 2023-02-23 DIAGNOSIS — G9341 Metabolic encephalopathy: Secondary | ICD-10-CM | POA: Diagnosis not present

## 2023-02-23 DIAGNOSIS — E669 Obesity, unspecified: Secondary | ICD-10-CM | POA: Diagnosis not present

## 2023-02-23 DIAGNOSIS — F03918 Unspecified dementia, unspecified severity, with other behavioral disturbance: Secondary | ICD-10-CM | POA: Diagnosis not present

## 2023-02-23 DIAGNOSIS — N179 Acute kidney failure, unspecified: Secondary | ICD-10-CM | POA: Diagnosis not present

## 2023-02-23 DIAGNOSIS — J9601 Acute respiratory failure with hypoxia: Secondary | ICD-10-CM | POA: Diagnosis not present

## 2023-02-23 DIAGNOSIS — Z515 Encounter for palliative care: Secondary | ICD-10-CM | POA: Diagnosis not present

## 2023-02-23 DIAGNOSIS — S065XAA Traumatic subdural hemorrhage with loss of consciousness status unknown, initial encounter: Secondary | ICD-10-CM | POA: Diagnosis not present

## 2023-02-23 DIAGNOSIS — E871 Hypo-osmolality and hyponatremia: Secondary | ICD-10-CM | POA: Diagnosis not present

## 2023-02-23 DIAGNOSIS — I6300 Cerebral infarction due to thrombosis of unspecified precerebral artery: Secondary | ICD-10-CM | POA: Diagnosis not present

## 2023-02-23 DIAGNOSIS — N3000 Acute cystitis without hematuria: Secondary | ICD-10-CM | POA: Diagnosis not present

## 2023-02-23 MED ORDER — GLYCOPYRROLATE 0.2 MG/ML IJ SOLN
0.3000 mg | INTRAMUSCULAR | Status: DC | PRN
  Administered 2023-02-23: 0.3 mg via INTRAVENOUS
  Filled 2023-02-23: qty 2

## 2023-02-23 NOTE — Plan of Care (Signed)
  Problem: Coping: Goal: Ability to identify and develop effective coping behavior will improve Outcome: Progressing   Problem: Clinical Measurements: Goal: Quality of life will improve Outcome: Progressing   Problem: Respiratory: Goal: Verbalizations of increased ease of respirations will increase Outcome: Progressing   Problem: Pain Management: Goal: Satisfaction with pain management regimen will improve Outcome: Progressing

## 2023-02-23 NOTE — Progress Notes (Signed)
Progress Note   Patient: [name]:[id number] DOB: [birth year] DOA: 03/04/2023     15 DOS: the patient was seen and examined on 02/23/2023   Brief hospital course: 77 year old female with a history of atrial fibrillation with rapid ventricular response, hypothyroidism, epilepsy, anxiety and depression, history of recurrent falls on multiple centrally acting medications came in with findings of atrial fibrillation rapid ventricular response was initially seen by cardiology medically treated placed on blood thinners with Eliquis underwent transesophageal echo with cardioversion. She is being treated for diastolic CHF as well has UTI. She does have a history of chronic diarrhea which was also managed on the floors. Initially had some improvement symptomatically was being diuresed on the floor then suddenly developed headaches and was treated with narcotics for this. She had CT head performed which showed subdural hematoma and patient was brought in to medical intensive care unit lethargic unable to protect airway with active intracranial bleeding. Multiple consultants were contacted including neurosurgery and plan is made to take patient to the operating room on 6/14 for subdural evacuation.  Patient converted to end-of-life care on 6/17 and was extubated and comfort care measures ordered.  6/19.  Patient was able to wiggle toes to command.  Opens eyes to sternal rub.  Had periods of apnea.  Asked nursing staff to give morphine a little more often.  Glycopyrrolate dose increased by palliative care. 6/20.  Son noticed periods where she was arching her neck with breathing.  Started morphine drip this morning. 6/21.  Continuing morphine drip was on 4.5 mg/h this morning.    Assessment and Plan: * End of life care Continue morphine drip, she was on 4.5 mg/h this morning.  As needed morphine pushes and can titrate up drip if requiring a lot of pushes.  Continue full comfort care measures..  Patient with  periods of apnea.  Subdural hematoma (HCC) Status post left frontotemporoparietal craniotomy for evacuation of hematoma on 6/14.  Acute respiratory failure with hypoxia [facility] 2) The patient was on the ventilator and on the critical care service.  Terminally extubated on 6/17.  Atrial fibrillation with rapid ventricular response (HCC) No further treatment  AKI (acute kidney injury) (HCC) On admission.  Acute metabolic encephalopathy Secondary to subdural hematoma  Obesity (BMI 30.0-34.9) BMI 36.80  Hyponatremia Last sodium 129        Subjective: Patient seen this morning was on 4.5 mg of morphine drip per hour for comfort care measures.  Patient had agonal breathing.  Unresponsive.  Physical Exam: Vitals:   02/20/23 0719 02/21/23 0004 02/22/23 0004 02/22/23 1446  BP: 112/74 (!) 119/91 (!) 144/116 126/87  Pulse:  68 (!) 170 83  Resp: 16 (!) 6 14 (!) 22  Temp: 98.3 F (36.8 C) 98.9 F (37.2 C) 98 F (36.7 C) 100.1 F (37.8 C)  TempSrc:    Axillary  SpO2: 93% 92% 93% 92%  Weight:      Height:       Physical Exam HENT:     Head: Normocephalic.  Eyes:     General: Lids are normal.     Comments: Both pupils fixed.  Cardiovascular:     Rate and Rhythm: Tachycardia present. Rhythm irregularly irregular.     Heart sounds: Normal heart sounds, S1 normal and S2 normal.  Pulmonary:     Breath sounds: Examination of the right-lower field reveals decreased breath sounds and rhonchi. Examination of the left-lower field reveals decreased breath sounds and rhonchi. Decreased breath sounds and rhonchi  present. No wheezing or rales.  Abdominal:     Palpations: Abdomen is soft.     Tenderness: There is no abdominal tenderness.  Musculoskeletal:     Right lower leg: No swelling.     Left lower leg: No swelling.  Skin:    General: Skin is warm.     Findings: No rash.  Neurological:     Mental Status: She is unresponsive.     Data Reviewed: No new data  Family  Communication: Son at bedside  Disposition: Status is: Inpatient Remains inpatient appropriate because: End-of-life care here in the hospital  Planned Discharge Destination: End-of-life care here in the hospital    Time spent: 25 minutes Case discussed with son and nursing staff at the bedside.  Author: Alford Highland, MD 02/23/2023 1:47 PM  For on call review [email or web address].

## 2023-02-23 NOTE — Progress Notes (Signed)
Daily Progress Note   Patient Name: [name]       Date: 02/23/2023 DOB: 12-20-1945  Age: 77 y.o. MRN#: [id number] Attending Physician: Alford Highland, MD Primary Care Physician: Smiley Houseman, NP Admit Date: 02/18/2023  Reason for Consultation/Follow-up: Establishing goals of care  HPI/Brief Hospital Review: 77 y.o. female  with past medical history of seizure disorder, hypothyroidism, anxiety, depression, neuropathy, frequent falls and cognitive impairment admitted from [facility] on 02/27/2023 after a fall and found to be in A. Fib with RVR with hypotension.   Underwent TEE cardioversion 6/11-converted to NSR   Developed headache 6/14-sent for head CT IMPRESSION: New large left and small right subdural hematomas with mass effect as above, including 1.6 cm of rightward midline shift and left uncal herniation.   02/21/23: Underwent craniotomy for evacuation of hematoma-subgaleal drain remains in place to bulb suction Returned to ICU post-op, intubated and sedated  Transitioned to comfort care 6/17, compassionate extubation  Palliative Medicine consulted for assisting with goals of care conversations.  Subjective: Extensive chart review has been completed prior to meeting patient including labs, vital signs, imaging, progress notes, orders, and available advanced directive documents from current and previous encounters.    Visited with [name] at her bedside. Resting comfortable with eyes closed, does not acknowledge my presence in room. Son-Tyler at bedside, answered and addressed all questions and concerns.  Review of MAR, continues on Morphine infusion with infrequent need for as needed blouses, Robinul as needed controlling secretions  Discussed and educated [name] on end  of life transition in relation to anticipated breathing patterns. [name] with extended periods of apnea. We also discussed Hospice IPU through [facility]. Discussed overall hospice philosophy as well as services offered through [facility] At this time, [name] wishes for time to consider transfer and time to discuss with his family including his father. Will need ongoing daily assessment for stability to transfer if and when possible.  No need for medication adjustments at this time. PMT to continue to follow for ongoing needs, support and symptom management.  Assessment/Recommendation/Plan  Comfort care, continue current medication regimen PMT to follow for ongoing symptom management Gentle disposition discussion with son at bedside today, introduced IPU   Thank you for allowing the Palliative Medicine Team to assist in the care of this patient.  Total time:  35 minutes  Time spent includes: Detailed review of medical records (labs, imaging, vital signs), medically appropriate exam (mental status, respiratory, cardiac, skin), discussed with treatment team, counseling and educating patient, family and staff, documenting clinical information, medication management and coordination of care.  Leeanne Deed, DNP, AGNP-C Palliative Medicine   Please contact Palliative Medicine Team phone at [phone] for questions and concerns.

## 2023-02-23 NOTE — Assessment & Plan Note (Signed)
Last sodium 129

## 2023-03-02 ENCOUNTER — Encounter: Payer: Medicare Other | Admitting: Orthopedic Surgery

## 2023-03-05 NOTE — Death Summary Note (Signed)
DEATH SUMMARY   Patient Details  Name: [name] MRN: [id number] DOB: 1946/05/08 NWG:NFAOZHY, Charlesetta Shanks, NP Admission/Discharge Information   Admit Date:  05-Mar-2023  Date of Death: Date of Death: 03/22/23 (Simultaneous filing. User may not have seen previous data.)  Time of Death: Time of Death: 0653 (Simultaneous filing. User may not have seen previous data.)  Length of Stay: 16   Principle Cause of death:   Hospital Diagnoses: Principal Problem:   End of life care Active Problems:   Subdural hematoma (HCC)   Acute respiratory failure with hypoxia (HCC)   Hypotension   Atrial fibrillation with rapid ventricular response (HCC)   AKI (acute kidney injury) (HCC)   Acute cystitis without hematuria   History of gastrointestinal hemorrhage   GERD with stricture   Hypokalemia   Hypothyroidism   Dementia with behavioral disturbance (HCC)   Falls   DEPENDENCE, OPIOID, CONTINUOUS   Hyponatremia   Obesity (BMI 30.0-34.9)   Cerebrovascular accident (CVA) due to thrombosis of precerebral artery (HCC)   Gait instability   Fall   Shortness of breath   Nonintractable headache   Other headache syndrome   Acute metabolic encephalopathy   Hospital Course: 77 year old female with a history of atrial fibrillation with rapid ventricular response, hypothyroidism, epilepsy, anxiety and depression, history of recurrent falls on multiple centrally acting medications came in with findings of atrial fibrillation rapid ventricular response was initially seen by cardiology medically treated placed on blood thinners with Eliquis underwent transesophageal echo with cardioversion. She is being treated for diastolic CHF as well has UTI. She does have a history of chronic diarrhea which was also managed on the floors. Initially had some improvement symptomatically was being diuresed on the floor then suddenly developed headaches and was treated with narcotics for this. She had CT head performed which  showed subdural hematoma and patient was brought in to medical intensive care unit lethargic unable to protect airway with active intracranial bleeding. Multiple consultants were contacted including neurosurgery took to operating room on 6/14 for subdural evacuation.  Patient converted to end-of-life care on 6/17 and was extubated and comfort care measures ordered.  6/19.  Patient was able to wiggle toes to command.  Opens eyes to sternal rub.  Had periods of apnea.  Asked nursing staff to give morphine a little more often.  Glycopyrrolate dose increased by palliative care. 6/20.  Son noticed periods where she was arching her neck with breathing.  Started morphine drip this morning. 6/21.  Continuing morphine drip was on 4.5 mg/h this morning. 2023-03-22. Patient expired at 6:53am   Assessment and Plan: * End of life care Patient expired on 03/22/23 at 6:53 AM she was on comfort care measures including morphine drip.  Subdural hematoma (HCC) Status post left frontotemporoparietal craniotomy for evacuation of hematoma on 6/14.  Acute respiratory failure with hypoxia [facility]) The patient was on the ventilator and on the critical care service.  Terminally extubated on 6/17.  Atrial fibrillation with rapid ventricular response (HCC) No further treatment  AKI (acute kidney injury) (HCC) On admission.  Acute metabolic encephalopathy Secondary to subdural hematoma  Obesity (BMI 30.0-34.9) BMI 36.80  Hyponatremia Last sodium 129         Procedures: Intubation 6/14, terminal extubation 6/17, subdural evacuation 6/14, cardioversion 6/11, central line 6/14  Consultations: Cardiology, neurology, neurosurgery, palliative care  The results of significant diagnostics from this hospitalization (including imaging, microbiology, ancillary and laboratory) are listed below for reference.   Significant Diagnostic  Studies: DG Chest Port 1 View  Result Date: 02/17/2023 CLINICAL DATA:  [id number] Acute  respiratory failure with hypoxia (HCC) 045409 EXAM: PORTABLE CHEST - 1 VIEW COMPARISON:  the previous day's study FINDINGS: This endotracheal tube, gastric tube, and left IJ central line stable in position. Relatively low lung volumes as before with some increase in scattered perihilar and bibasilar interstitial airspace opacities. Heart size upper limits normal. Suspect small left pleural effusion, new since previous. Post right shoulder arthroplasty. Peripherally calcified breast implants. Cement augmentation in an upper lumbar vertebral body. IMPRESSION: 1. Low lung volumes with increasing perihilar and bibasilar interstitial opacities. 2. Suspect small left pleural effusion. Electronically Signed   By: Corlis Leak M.D.   On: 02/17/2023 07:46   CT HEAD WO CONTRAST ( )  Result Date: 02/25/2023 CLINICAL DATA:  Subdural hematoma, follow-up EXAM: CT HEAD WITHOUT CONTRAST TECHNIQUE: Contiguous axial images were obtained from the base of the skull through the vertex without intravenous contrast. RADIATION DOSE REDUCTION: This exam was performed according to the departmental dose-optimization program which includes automated exposure control, adjustment of the mA and/or kV according to patient size and/or use of iterative reconstruction technique. COMPARISON:  02/23/2023 CT head FINDINGS: Brain: Status post interval left frontoparietal craniotomy for evacuation of the previously noted subdural hematoma. A drain is noted terminating in the left subdural space. Pneumocephalus along the left frontal convexity, with a small subdural collection measuring up to 6 mm (series 2, image 17). Hemorrhage is again noted along the anterior falx, measuring to 10 mm, previously 14 mm; along the posterior falx, measuring up to 8 mm, previously 8 mm; an along the left tentorium, measuring up to 7 mm, previously 8 mm. 8 mm left to right midline shift, previously 17 mm when remeasured similarly. Decreased mass effect on the lateral  and third ventricles. The basilar cisterns are patent. No evidence of acute infarct, parenchymal hemorrhage, or mass. No hydrocephalus. Unchanged mixed density subdural collection over the right frontal convexity, which measures up to 6 mm, unchanged. Vascular: No hyperdense vessel. Skull: Left frontotemporal craniotomy. Superficial skin staples. Air in the soft tissues is not unexpected postoperatively. Sinuses/Orbits: Clear paranasal sinuses. Status post bilateral lens replacements. Other: The mastoids are well aerated. IMPRESSION: 1. Status post left frontoparietal craniotomy for evacuation of the previously noted left subdural hematoma, with decreased midline shift, now 8 mm, and decreased mass effect on the lateral and third ventricles. 2. Pneumocephalus and a small subdural collection along the left frontal convexity, with stable to slightly decreased hematomas along the falx and tentorium. 3. Unchanged mixed density subdural collection over the right frontal convexity, which measures up to 6 mm. Electronically Signed   By: Wiliam Ke M.D.   On: 02/28/2023 21:45   DG Abd 1 View  Result Date: 02/10/2023 CLINICAL DATA:  Placement of enteric tube EXAM: ABDOMEN - 1 VIEW COMPARISON:  09/13/2004 FINDINGS: Bowel gas pattern is nonspecific. There is moderate gaseous distention of transverse colon. Tip of enteric tube is seen in the region of the antrum of the stomach. There is contrast in the pelvocaliceal systems related to previous CT angiogram. There is kyphoplasty in lower thoracic spine, possibly T12 vertebra. There is evidence of previous ventral hernia repair. IMPRESSION: Tip of the enteric tube is seen in the region of antrum of the stomach. Electronically Signed   By: Ernie Avena M.D.   On: 02/11/2023 15:29   DG Chest Port 1 View  Result Date: 02/15/2023 CLINICAL DATA:  Difficulty breathing, status  post intubation EXAM: PORTABLE CHEST 1 VIEW COMPARISON:  Previous studies including the  examination none on 02/10/2023 FINDINGS: Transverse diameter of heart is increased. The central pulmonary vessels are more prominent. Increased interstitial markings are seen in both lungs, more so on the right side. There is blunting of both lateral CP angles. There is no pneumothorax. There is interval placement of endotracheal tube with its tip 4 cm above the carina. There is interval placement of left IJ central venous catheter with its tip in superior vena cava. There is interval placement of enteric tube with its distal portion in the stomach. There is previous reverse arthroplasty right shoulder. There is dense calcification in bilateral breast implants. IMPRESSION: Cardiomegaly. Increased interstitial and alveolar markings are seen in both lungs, more so on the right side. Findings may suggest asymmetric pulmonary edema or multifocal pneumonia. Possible small bilateral pleural effusions. Support devices as described. Electronically Signed   By: Ernie Avena M.D.   On: 03/04/2023 15:27   CT ANGIO HEAD NECK W WO CM (CODE STROKE)  Result Date: 02/04/2023 CLINICAL DATA:  Neuro deficit, acute, stroke suspected EXAM: CT ANGIOGRAPHY HEAD AND NECK WITH AND WITHOUT CONTRAST TECHNIQUE: Multidetector CT imaging of the head and neck was performed using the standard protocol during bolus administration of intravenous contrast. Multiplanar CT image reconstructions and MIPs were obtained to evaluate the vascular anatomy. Carotid stenosis measurements (when applicable) are obtained utilizing NASCET criteria, using the distal internal carotid diameter as the denominator. RADIATION DOSE REDUCTION: This exam was performed according to the departmental dose-optimization program which includes automated exposure control, adjustment of the mA and/or kV according to patient size and/or use of iterative reconstruction technique. CONTRAST:  75mL OMNIPAQUE IOHEXOL 350 MG/ML SOLN COMPARISON:  Same day CT head FINDINGS: CT  HEAD FINDINGS See same day CT brain for intracranial findings. Note that the presence of iodinated contrast limits the ability to assess for intracranial hemorrhage. Redemonstrated is a large left cerebral convexity subdural hematoma measuring up to 2.2 cm with 1.5 cm rightward midline shift. The size of the right temporal horn has increased compared to recent prior head CT dated 06/24, which raises the possibility for ventricular entrapment. There is evidence of left-sided uncal herniation. Subdural blood products extending along the falx, left tentorial leaflet, and are also seen along the right frontal convexity (series 5, image 41). CTA NECK FINDINGS Aortic arch: Standard branching. Imaged portion shows no evidence of aneurysm or dissection. No significant stenosis of the major arch vessel origins. Right carotid system: No evidence of dissection, stenosis (50% or greater), or occlusion. Left carotid system: No evidence of dissection, stenosis (50% or greater), or occlusion. Vertebral arteries: Codominant. No evidence of dissection, stenosis (50% or greater), or occlusion. Skeleton: Negative. Other neck: Negative. Upper chest: Partially imaged bilateral breast implants. The main pulmonary artery measures up to 3.4 cm, which can be seen in the setting of pulmonary artery hypertension. Biapical ground-glass opacities are favored to be infectious or inflammatory. Right shoulder arthroplasty. The esophagus is patulous, which places the patient at risk for aspiration. Review of the MIP images confirms the above findings CTA HEAD FINDINGS Anterior circulation: No significant stenosis, proximal occlusion, aneurysm, or vascular malformation. Posterior circulation: No significant stenosis, proximal occlusion, aneurysm, or vascular malformation. Venous sinuses: As permitted by contrast timing, patent. Anatomic variants: Fetal PCA on the left. Review of the MIP images confirms the above findings IMPRESSION: 1. No  intracranial large vessel occlusion or significant stenosis. 2. No hemodynamically significant stenosis  in the neck. 3. Redemonstrated is a large left cerebral convexity subdural hematoma measuring up to 2.2 cm with 1.5 cm rightward midline shift. The size of the right temporal horn has increased compared to recent prior head CT dated 06/24, which raises the possibility for early ventricular entrapment. There is evidence of left-sided uncal herniation. 4. Subdural blood products extending along the falx, left tentorial leaflet, and are also seen along the right frontal convexity. 5. The main pulmonary artery measures up to 3.4 cm, which can be seen in the setting of pulmonary artery hypertension. 6. Biapical ground-glass opacities are favored to be infectious or inflammatory. 7. The esophagus is patulous, which places the patient at risk for aspiration. Electronically Signed   By: Lorenza Cambridge M.D.   On: 02/11/2023 13:55   CT HEAD WO CONTRAST ( )  Result Date: 02/09/2023 CLINICAL DATA:  Headache, increasing frequency or severity. EXAM: CT HEAD WITHOUT CONTRAST TECHNIQUE: Contiguous axial images were obtained from the base of the skull through the vertex without intravenous contrast. RADIATION DOSE REDUCTION: This exam was performed according to the departmental dose-optimization program which includes automated exposure control, adjustment of the mA and/or kV according to patient size and/or use of iterative reconstruction technique. COMPARISON:  Head CT 02/10/2023 FINDINGS: Brain: A new acute large subdural hematoma over the left cerebral convexity measures up to 2.1 cm in thickness. There is prominent mass effect on the left cerebral hemisphere with partial effacement of the lateral and third ventricles, 1.6 cm of rightward midline shift, and left uncal herniation. Mild dilatation of the right lateral ventricle may reflect early trapping. Subdural hemorrhage extending along the falx measures up to 1.5 cm in  thickness, and subdural hemorrhage extending along the left tentorium measures up to 0.6 cm in thickness. A new small mixed density subdural hematoma anteriorly over the right frontal convexity measures 0.5 cm in thickness. 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 No acute infarct is identified. Vascular: Calcified atherosclerosis at the skull base. Skull: No acute fracture or suspicious osseous lesion. Sinuses/Orbits: Visualized paranasal sinuses in mastoid air cells are clear. Bilateral cataract extraction. Other: None. Critical Value/emergent results were called by telephone at the time of interpretation on 02/03/2023 at 1:23 pm to Dr. Marcelino Duster, who verbally acknowledged these results. IMPRESSION: New large left and small right subdural hematomas with mass effect as above, including 1.6 cm of rightward midline shift and left uncal herniation. Electronically Signed   By: Sebastian Ache M.D.   On: 02/10/2023 13:25   ECHO TEE  Result Date: 02/15/23    TRANSESOPHOGEAL ECHO REPORT   Patient Name:   TEQUITA MARRS Date of Exam: 02-15-23 Medical Rec #:  [id number]     Height:       65.0 in Accession #:    [id number]    Weight:       224.9 lb Date of Birth:  12/22/1945     BSA:          2.079 m Patient Age:    77 years      BP:           115/80 mmHg Patient Gender: F             HR:           120 bpm. Exam Location:  [facility] Procedure: Transesophageal Echo Indications:     atrial fibrillation with RVR, SOB  History:  Patient has prior history of Echocardiogram examinations.                  Arrythmias:Atrial Fibrillation; Signs/Symptoms:Shortness of                  Breath.  Sonographer:     Cristela Blue Referring Phys:  [id number] Sondra Barges Diagnosing Phys: Julien Nordmann MD PROCEDURE: After discussion of the risks and benefits of a TEE, an informed consent was obtained from the patient. TEE procedure time was 30 minutes. The transesophogeal probe was  passed without difficulty through the esophogus of the patient. Imaged were obtained with the patient in a left lateral decubitus position. Local oropharyngeal anesthetic was provided with Cetacaine and viscous lidocaine. Sedation performed by different physician. Image quality was good. The patient's vital signs; including  heart rate, blood pressure, and oxygen saturation; remained stable throughout the procedure. The patient developed no complications during the procedure. A successful direct current cardioversion was performed at 150 joules with 1 attempt.  IMPRESSIONS  1. Left ventricular ejection fraction, by estimation, is 50 to 55%. The left ventricle has low normal function. The left ventricle has no regional wall motion abnormalities.  2. Right ventricular systolic function is low normal. The right ventricular size is normal.  3. Left atrial size was moderately dilated. No left atrial/left atrial appendage thrombus was detected.  4. Right atrial size was moderately dilated.  5. The mitral valve is normal in structure. Moderate mitral valve regurgitation. No evidence of mitral stenosis.  6. The aortic valve is tricuspid. Aortic valve regurgitation is mild to moderate. No aortic stenosis is present.  7. The inferior vena cava is normal in size with greater than 50% respiratory variability, suggesting right atrial pressure of 3 mmHg.  8. Agitated saline contrast bubble study was positive with shunting observed within 3-6 cardiac cycles suggestive of interatrial shunt. There is a small patent foramen ovale with predominantly right to left shunting across the atrial septum. Conclusion(s)/Recommendation(s): Normal biventricular function without evidence of hemodynamically significant valvular heart disease. FINDINGS  Left Ventricle: Left ventricular ejection fraction, by estimation, is 50 to 55%. The left ventricle has low normal function. The left ventricle has no regional wall motion abnormalities. The left  ventricular internal cavity size was normal in size. There is no left ventricular hypertrophy. Right Ventricle: The right ventricular size is normal. No increase in right ventricular wall thickness. Right ventricular systolic function is low normal. Left Atrium: Left atrial size was moderately dilated. No left atrial/left atrial appendage thrombus was detected. Right Atrium: Right atrial size was moderately dilated. Pericardium: There is no evidence of pericardial effusion. Mitral Valve: The mitral valve is normal in structure. Moderate mitral valve regurgitation. No evidence of mitral valve stenosis. Tricuspid Valve: The tricuspid valve is normal in structure. Tricuspid valve regurgitation is mild . No evidence of tricuspid stenosis. Aortic Valve: The aortic valve is tricuspid. Aortic valve regurgitation is mild to moderate. No aortic stenosis is present. Pulmonic Valve: The pulmonic valve was normal in structure. Pulmonic valve regurgitation is not visualized. No evidence of pulmonic stenosis. Aorta: The aortic root is normal in size and structure. Venous: The inferior vena cava is normal in size with greater than 50% respiratory variability, suggesting right atrial pressure of 3 mmHg. IAS/Shunts: No atrial level shunt detected by color flow Doppler. Agitated saline contrast bubble study was positive with shunting observed within 3-6 cardiac cycles suggestive of interatrial shunt. A small patent foramen ovale is detected with  predominantly right to left shunting across the atrial septum. Julien Nordmann MD Electronically signed by Julien Nordmann MD Signature Date/Time: 02/25/2023/1:30:10 PM    Final    ECHOCARDIOGRAM COMPLETE BUBBLE STUDY  Result Date: 02/08/2023    ECHOCARDIOGRAM REPORT   Patient Name:   ALFREDO COLLYMORE Date of Exam: 02/08/2023 Medical Rec #:  [id number]     Height:       65.0 in Accession #:    [id number]    Weight:       224.9 lb Date of Birth:  1946/06/29     BSA:          2.079 m Patient Age:     77 years      BP:           111/64 mmHg Patient Gender: F             HR:           98 bpm. Exam Location:  [facility] Procedure: 2D Echo, Cardiac Doppler, Color Doppler and Saline Contrast Bubble            Study Indications:     Atrial Fibrillation  History:         Patient has prior history of Echocardiogram examinations, most                  recent 06/04/2020. CHF, Stroke; Arrythmias:Atrial Fibrillation.  Sonographer:     Mikki Harbor Referring Phys:  [id number] Eliezer Mccoy PATEL Diagnosing Phys: Julien Nordmann MD  Sonographer Comments: Patient is obese. IMPRESSIONS  1. Left ventricular ejection fraction, by estimation, is 60 to 65%. The left ventricle has normal function. The left ventricle has no regional wall motion abnormalities. There is mild left ventricular hypertrophy. Left ventricular diastolic parameters are indeterminate.  2. Right ventricular systolic function is normal. The right ventricular size is normal. There is mildly elevated pulmonary artery systolic pressure.  3. Left atrial size was moderately dilated.  4. The mitral valve is normal in structure. Mild to moderate mitral valve regurgitation. No evidence of mitral stenosis.  5. The aortic valve has an indeterminant number of cusps. Aortic valve regurgitation is mild to moderate. No aortic stenosis is present.  6. The inferior vena cava is normal in size with greater than 50% respiratory variability, suggesting right atrial pressure of 3 mmHg.  7. Agitated saline contrast bubble study was negative, with no evidence of any interatrial shunt. FINDINGS  Left Ventricle: Left ventricular ejection fraction, by estimation, is 60 to 65%. The left ventricle has normal function. The left ventricle has no regional wall motion abnormalities. The left ventricular internal cavity size was normal in size. There is  mild left ventricular hypertrophy. Left ventricular diastolic parameters are indeterminate. Right Ventricle: The right ventricular size is normal. No  increase in right ventricular wall thickness. Right ventricular systolic function is normal. There is mildly elevated pulmonary artery systolic pressure. The tricuspid regurgitant velocity is 2.71  m/s, and with an assumed right atrial pressure of 8 mmHg, the estimated right ventricular systolic pressure is 37.4 mmHg. Left Atrium: Left atrial size was moderately dilated. Right Atrium: Right atrial size was normal in size. Pericardium: There is no evidence of pericardial effusion. Mitral Valve: The mitral valve is normal in structure. Mild to moderate mitral valve regurgitation. No evidence of mitral valve stenosis. MV peak gradient, 6.6 mmHg. The mean mitral valve gradient is 2.0 mmHg. Tricuspid Valve: The tricuspid valve is normal in structure. Tricuspid valve regurgitation is  mild . No evidence of tricuspid stenosis. Aortic Valve: The aortic valve has an indeterminant number of cusps. Aortic valve regurgitation is mild to moderate. No aortic stenosis is present. Aortic valve mean gradient measures 3.3 mmHg. Aortic valve peak gradient measures 6.2 mmHg. Aortic valve area, by VTI measures 2.32 cm. Pulmonic Valve: The pulmonic valve was normal in structure. Pulmonic valve regurgitation is not visualized. No evidence of pulmonic stenosis. Aorta: The aortic root is normal in size and structure. Venous: The inferior vena cava is normal in size with greater than 50% respiratory variability, suggesting right atrial pressure of 3 mmHg. IAS/Shunts: No atrial level shunt detected by color flow Doppler. Agitated saline contrast was given intravenously to evaluate for intracardiac shunting. Agitated saline contrast bubble study was negative, with no evidence of any interatrial shunt. There  is no evidence of a patent foramen ovale. There is no evidence of an atrial septal defect.  LEFT VENTRICLE PLAX 2D LVIDd:         4.70 cm LVIDs:         3.30 cm LV PW:         1.30 cm LV IVS:        1.30 cm LVOT diam:     1.90 cm LV SV:          54 LV SV Index:   26 LVOT Area:     2.84 cm  RIGHT VENTRICLE RV Basal diam:  3.50 cm RV Mid diam:    3.20 cm RV S prime:     10.80 cm/s LEFT ATRIUM             Index        RIGHT ATRIUM           Index LA diam:        4.40 cm 2.12 cm/m   RA Area:     21.30 cm LA Vol (A2C):   85.3 ml 41.03 ml/m  RA Volume:   56.60 ml  27.22 ml/m LA Vol (A4C):   90.9 ml 43.72 ml/m LA Biplane Vol: 91.8 ml 44.15 ml/m  AORTIC VALVE                    PULMONIC VALVE AV Area (Vmax):    2.44 cm     PV Vmax:       0.96 m/s AV Area (Vmean):   2.14 cm     PV Peak grad:  3.7 mmHg AV Area (VTI):     2.32 cm AV Vmax:           124.33 cm/s AV Vmean:          79.533 cm/s AV VTI:            0.231 m AV Peak Grad:      6.2 mmHg AV Mean Grad:      3.3 mmHg LVOT Vmax:         107.00 cm/s LVOT Vmean:        60.100 cm/s LVOT VTI:          0.189 m LVOT/AV VTI ratio: 0.82  AORTA Ao Root diam: 3.70 cm Ao Asc diam:  3.60 cm MITRAL VALVE                TRICUSPID VALVE MV Area (PHT): 4.54 cm     TR Peak grad:   29.4 mmHg MV Area VTI:   2.89 cm     TR Vmax:  271.00 cm/s MV Peak grad:  6.6 mmHg MV Mean grad:  2.0 mmHg     SHUNTS MV Vmax:       1.28 m/s     Systemic VTI:  0.19 m MV Vmean:      69.3 cm/s    Systemic Diam: 1.90 cm MV Decel Time: 167 msec MV E velocity: 106.00 cm/s Julien Nordmann MD Electronically signed by Julien Nordmann MD Signature Date/Time: 02/08/2023/3:26:18 PM    Final    US Venous Img Lower Bilateral (DVT)  Result Date: 02/08/2023 CLINICAL DATA:  Bilateral lower extremity edema. EXAM: BILATERAL LOWER EXTREMITY VENOUS DOPPLER ULTRASOUND TECHNIQUE: Gray-scale sonography with graded compression, as well as color Doppler and duplex ultrasound were performed to evaluate the lower extremity deep venous systems from the level of the common femoral vein and including the common femoral, femoral, profunda femoral, popliteal and calf veins including the posterior tibial, peroneal and gastrocnemius veins when visible. The  superficial great saphenous vein was also interrogated. Spectral Doppler was utilized to evaluate flow at rest and with distal augmentation maneuvers in the common femoral, femoral and popliteal veins. COMPARISON:  None Available. FINDINGS: RIGHT LOWER EXTREMITY Common Femoral Vein: No evidence of thrombus. Normal compressibility, respiratory phasicity and response to augmentation. Saphenofemoral Junction: No evidence of thrombus. Normal compressibility and flow on color Doppler imaging. Profunda Femoral Vein: No evidence of thrombus. Normal compressibility and flow on color Doppler imaging. Femoral Vein: No evidence of thrombus. Normal compressibility, respiratory phasicity and response to augmentation. Popliteal Vein: No evidence of thrombus. Normal compressibility, respiratory phasicity and response to augmentation. Calf Veins: No evidence of thrombus. Normal compressibility and flow on color Doppler imaging. Superficial Great Saphenous Vein: No evidence of thrombus. Normal compressibility. Venous Reflux:  None. Other Findings:  None. LEFT LOWER EXTREMITY Common Femoral Vein: No evidence of thrombus. Normal compressibility, respiratory phasicity and response to augmentation. Saphenofemoral Junction: No evidence of thrombus. Normal compressibility and flow on color Doppler imaging. Profunda Femoral Vein: No evidence of thrombus. Normal compressibility and flow on color Doppler imaging. Femoral Vein: No evidence of thrombus. Normal compressibility, respiratory phasicity and response to augmentation. Popliteal Vein: No evidence of thrombus. Normal compressibility, respiratory phasicity and response to augmentation. Calf Veins: No evidence of thrombus. Normal compressibility and flow on color Doppler imaging. Superficial Great Saphenous Vein: No evidence of thrombus. Normal compressibility. Venous Reflux:  None. Other Findings:  None. IMPRESSION: No evidence of deep venous thrombosis in either lower extremity.  Electronically Signed   By: Alcide Clever M.D.   On: 02/08/2023 02:57   CT Cervical Spine Wo Contrast  Result Date: 02/08/2023 CLINICAL DATA:  Neck trauma.  Fall. EXAM: CT CERVICAL SPINE WITHOUT CONTRAST TECHNIQUE: Multidetector CT imaging of the cervical spine was performed without intravenous contrast. Multiplanar CT image reconstructions were also generated. RADIATION DOSE REDUCTION: This exam was performed according to the departmental dose-optimization program which includes automated exposure control, adjustment of the mA and/or kV according to patient size and/or use of iterative reconstruction technique. COMPARISON:  MRI 12/25/2016 FINDINGS: Alignment: Loss of cervical lordosis. 3 mm of anterolisthesis of C3 on C4 related to facet disease. Skull base and vertebrae: Mild compression fracture involving the T1 vertebral body. No additional fracture seen. No focal bone lesion. Soft tissues and spinal canal: No prevertebral fluid or swelling. No visible canal hematoma. Disc levels: Diffuse degenerative disc disease, most pronounced from C4-5 through C6-7. Moderate bilateral degenerative facet disease, left greater than right. Upper chest: No acute findings Other: None  IMPRESSION: Mild compression fracture involving the T1 vertebral body, age indeterminate but new since 2018. Degenerative disc and facet disease. Electronically Signed   By: Charlett Nose M.D.   On: 02/05/2023 21:48   CT CHEST ABDOMEN PELVIS W CONTRAST  Result Date: 02/04/2023 CLINICAL DATA:  Polytrauma, blunt fall, hemodynamically unstable EXAM: CT CHEST, ABDOMEN, AND PELVIS WITH CONTRAST TECHNIQUE: Multidetector CT imaging of the chest, abdomen and pelvis was performed following the standard protocol during bolus administration of intravenous contrast. RADIATION DOSE REDUCTION: This exam was performed according to the departmental dose-optimization program which includes automated exposure control, adjustment of the mA and/or kV according to  patient size and/or use of iterative reconstruction technique. CONTRAST:  OMNIPAQUE IOHEXOL 300 MG/ML  SOLN COMPARISON:  07/13/2017 FINDINGS: CT CHEST FINDINGS Cardiovascular: Heart is mildly enlarged. Aorta normal caliber. No aneurysm or dissection. Mediastinum/Nodes: No mediastinal, hilar, or axillary adenopathy. Trachea and esophagus are unremarkable. Thyroid unremarkable. Lungs/Pleura: Linear areas of scarring throughout the lungs bilaterally. No confluent opacities, effusions or pneumothorax. Musculoskeletal: Bilateral calcified breast implants. Vertebroplasty changes at T12. mild wedged appearance of the T1 vertebral body could reflect mild compression fracture. CT ABDOMEN PELVIS FINDINGS Hepatobiliary: No focal hepatic abnormality. Gallbladder unremarkable. Pancreas: No focal abnormality or ductal dilatation. Fatty replacement. Spleen: No splenic injury or perisplenic hematoma. Adrenals/Urinary Tract: No adrenal hemorrhage or renal injury identified. Urinary bladder appears thick walled. This could reflect cystitis. Stomach/Bowel: Sigmoid diverticulosis. No active diverticulitis. Stomach and small bowel decompressed, unremarkable. Vascular/Lymphatic: No evidence of aneurysm or adenopathy. Reproductive: 2 Prior hysterectomy.  No adnexal masses. Other: No free fluid or free air. Musculoskeletal: Mild compression fracture involving the L4 vertebral body, stable since 2018. No acute fracture. Degenerative disc and facet disease throughout the lumbar spine. IMPRESSION: Mild wedged appearance of the T1 vertebral body may reflect mild compression fracture. Otherwise no evidence of significant traumatic injury in chest, abdomen or pelvis. Cardiomegaly. Areas of scarring throughout the lungs bilaterally. Sigmoid diverticulosis. Bladder wall thickening which could reflect cystitis. Recommend correlation with urinalysis. Electronically Signed   By: Charlett Nose M.D.   On: 02/26/2023 21:47   CT Head Wo  Contrast  Result Date: 02/11/2023 CLINICAL DATA:  Fall EXAM: CT HEAD WITHOUT CONTRAST TECHNIQUE: Contiguous axial images were obtained from the base of the skull through the vertex without intravenous contrast. RADIATION DOSE REDUCTION: This exam was performed according to the departmental dose-optimization program which includes automated exposure control, adjustment of the mA and/or kV according to patient size and/or use of iterative reconstruction technique. COMPARISON:  09/09/2020 FINDINGS: Brain: Advanced atrophy. 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 No acute intracranial abnormality. Specifically, no hemorrhage, hydrocephalus, mass lesion, acute infarction, or significant intracranial injury. Vascular: No hyperdense vessel or unexpected calcification. Skull: No acute calvarial abnormality. 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 Atrophy, chronic microvascular disease. No acute intracranial abnormality. Electronically Signed   By: Charlett Nose M.D.   On: 02/26/2023 21:35   DG Chest Port 1 View  Result Date: 02/21/2023 CLINICAL DATA:  Questionable sepsis.  Shortness of breath EXAM: PORTABLE CHEST 1 VIEW COMPARISON:  05/21/2022 FINDINGS: Stable enlargement of the cardiomediastinal silhouette. Pulmonary vascular congestion and interstitial coarsening. No focal consolidation, pleural effusion, or pneumothorax. Remote left rib fractures. Breast implants. Right reverse TSA. IMPRESSION: Cardiomegaly with pulmonary vascular congestion. Electronically Signed   By: Minerva Fester M.D.   On: 02/23/2023 19:43   DG Hip  Unilat With Pelvis 2-3 Views Left  Result Date: 02/01/2023 CLINICAL DATA:  Fall and left lower extremity pain. EXAM: DG HIP (WITH OR WITHOUT PELVIS) 2-3V LEFT COMPARISON:  None Available. FINDINGS: There is no acute fracture or dislocation.  Femoroacetabular alignment is normal. There is moderate femoroacetabular joint space narrowing bilaterally with subchondral sclerosis and mild osteophytosis. The SI joints and symphysis pubis are intact. Soft tissue calcifications projecting superior to the right iliac wing are noted. IMPRESSION: No acute fracture or dislocation. Electronically Signed   By: Lesia Hausen M.D.   On: 02/01/2023 15:49   DG Knee Complete 4 Views Left  Result Date: 02/01/2023 CLINICAL DATA:  Pain after fall EXAM: LEFT KNEE - COMPLETE 5 VIEW COMPARISON:  None Available. FINDINGS: Osteopenia. Mild joint space loss of the lateral compartment. Moderate osteophytes are seen of all 3 compartments. No joint effusion on lateral view. No obvious fracture or dislocation. IMPRESSION: Tricompartmental degenerative changes with osteopenia. No fracture identified. Electronically Signed   By: Karen Kays M.D.   On: 02/01/2023 15:48    Microbiology: Recent Results (from the past 240 hour(s))  MRSA Next Gen by PCR, Nasal     Status: Abnormal   Collection Time: 02/15/2023  2:01 PM   Specimen: Nasal Mucosa; Nasal Swab  Result Value Ref Range Status   MRSA by PCR Next Gen DETECTED (A) NOT DETECTED Final    Comment: RESULT CALLED TO, READ BACK BY AND VERIFIED WITH: KELLY ICNHAER AT 1751 ON 02/15/2023 BY SS (NOTE) The GeneXpert MRSA Assay (FDA approved for NASAL specimens only), is one component of a comprehensive MRSA colonization surveillance program. It is not intended to diagnose MRSA infection nor to guide or monitor treatment for MRSA infections. Test performance is not FDA approved in patients less than 27 years old. Performed at [facility], [address] [place], [place] [address]   Culture, blood (Routine X 2) w Reflex to ID Panel     Status: None   Collection Time: 02/15/2023  6:39 PM   Specimen: BLOOD  Result Value Ref Range Status   Specimen Description   Final    BLOOD BLOOD RIGHT ARM Performed at [facility], [address], [place], [place] [address]    Special Requests   Final    BOTTLES DRAWN AEROBIC AND ANAEROBIC Blood Culture adequate volume Performed at [facility], [address], [place], [place] [address]    Culture   Final    NO GROWTH 5 DAYS Performed at [facility], [address], [place], [place] [address]    Report Status 02/21/2023 FINAL  Final  Culture, blood (Routine X 2) w Reflex to ID Panel     Status: None   Collection Time: 02/14/2023  6:56 PM   Specimen: BLOOD  Result Value Ref Range Status   Specimen Description   Final    BLOOD BLOOD LEFT ARM Performed at [facility], [address], [place], [place] [address]    Special Requests   Final    BOTTLES DRAWN AEROBIC AND ANAEROBIC Blood Culture adequate volume Performed at [facility], [address] [place], [place] [address]    Culture   Final    NO GROWTH 5 DAYS Performed at [facility], [address], [place], [place] [address]    Report Status 02/21/2023 FINAL  Final    Time spent: 15 minutes.  Patient not seen on the day of death.  Did speak  with nursing staff and patient's son.  Signed: Alford Highland, MD 02/05/2023

## 2023-03-05 NOTE — Progress Notes (Signed)
Called and spoke to son to let him know that the patient expired at 06:53 am this morning.

## 2023-03-05 DEATH — deceased

## 2023-03-26 ENCOUNTER — Encounter: Payer: Medicare Other | Admitting: Neurosurgery

## 2023-03-29 ENCOUNTER — Ambulatory Visit: Payer: Medicare Other | Admitting: Neurology

## 2023-05-17 ENCOUNTER — Ambulatory Visit: Payer: Medicare Other | Admitting: Neurology
